# Patient Record
Sex: Female | Born: 1975
Health system: Southern US, Community
[De-identification: ages and names within clinical notes are randomized; demographics above are authoritative.]

## PROBLEM LIST (undated history)

## (undated) DIAGNOSIS — M1990 Unspecified osteoarthritis, unspecified site: Secondary | ICD-10-CM

## (undated) DIAGNOSIS — E739 Lactose intolerance, unspecified: Secondary | ICD-10-CM

## (undated) DIAGNOSIS — I10 Essential (primary) hypertension: Secondary | ICD-10-CM

## (undated) DIAGNOSIS — N809 Endometriosis, unspecified: Secondary | ICD-10-CM

## (undated) DIAGNOSIS — M25561 Pain in right knee: Secondary | ICD-10-CM

## (undated) DIAGNOSIS — E119 Type 2 diabetes mellitus without complications: Secondary | ICD-10-CM

## (undated) DIAGNOSIS — J309 Allergic rhinitis, unspecified: Secondary | ICD-10-CM

## (undated) DIAGNOSIS — N83209 Unspecified ovarian cyst, unspecified side: Secondary | ICD-10-CM

## (undated) DIAGNOSIS — I639 Cerebral infarction, unspecified: Secondary | ICD-10-CM

## (undated) DIAGNOSIS — G4730 Sleep apnea, unspecified: Secondary | ICD-10-CM

## (undated) DIAGNOSIS — E785 Hyperlipidemia, unspecified: Secondary | ICD-10-CM

## (undated) HISTORY — DX: Essential (primary) hypertension: I10

## (undated) HISTORY — DX: Cerebral infarction, unspecified: I63.9

## (undated) HISTORY — DX: Hyperlipidemia, unspecified: E78.5

## (undated) HISTORY — DX: Morbid (severe) obesity due to excess calories: E66.01

## (undated) HISTORY — DX: Pain in right knee: M25.561

## (undated) HISTORY — DX: Allergic rhinitis, unspecified: J30.9

## (undated) HISTORY — DX: Unspecified ovarian cyst, unspecified side: N83.209

## (undated) HISTORY — DX: Lactose intolerance, unspecified: E73.9

## (undated) HISTORY — DX: Type 2 diabetes mellitus without complications: E11.9

## (undated) HISTORY — DX: Unspecified osteoarthritis, unspecified site: M19.90

## (undated) HISTORY — DX: Endometriosis, unspecified: N80.9

---

## 1999-05-20 ENCOUNTER — Emergency Department (HOSPITAL_COMMUNITY): Admission: EM | Admit: 1999-05-20 | Discharge: 1999-05-21 | Payer: Self-pay | Admitting: Emergency Medicine

## 2001-01-19 ENCOUNTER — Encounter: Payer: Self-pay | Admitting: Emergency Medicine

## 2001-01-19 ENCOUNTER — Emergency Department (HOSPITAL_COMMUNITY): Admission: EM | Admit: 2001-01-19 | Discharge: 2001-01-19 | Payer: Self-pay | Admitting: Emergency Medicine

## 2004-07-17 ENCOUNTER — Emergency Department (HOSPITAL_COMMUNITY): Admission: EM | Admit: 2004-07-17 | Discharge: 2004-07-17 | Payer: Self-pay | Admitting: Emergency Medicine

## 2005-08-12 ENCOUNTER — Emergency Department (HOSPITAL_COMMUNITY): Admission: EM | Admit: 2005-08-12 | Discharge: 2005-08-12 | Payer: Self-pay | Admitting: Emergency Medicine

## 2006-01-26 ENCOUNTER — Encounter: Admission: RE | Admit: 2006-01-26 | Discharge: 2006-01-26 | Payer: Self-pay | Admitting: Orthopedic Surgery

## 2006-06-06 ENCOUNTER — Emergency Department (HOSPITAL_COMMUNITY): Admission: EM | Admit: 2006-06-06 | Discharge: 2006-06-06 | Payer: Self-pay | Admitting: Family Medicine

## 2006-10-06 ENCOUNTER — Emergency Department (HOSPITAL_COMMUNITY): Admission: EM | Admit: 2006-10-06 | Discharge: 2006-10-06 | Payer: Self-pay | Admitting: Family Medicine

## 2006-10-27 ENCOUNTER — Emergency Department (HOSPITAL_COMMUNITY): Admission: EM | Admit: 2006-10-27 | Discharge: 2006-10-27 | Payer: Self-pay | Admitting: Emergency Medicine

## 2007-02-09 ENCOUNTER — Emergency Department (HOSPITAL_COMMUNITY): Admission: EM | Admit: 2007-02-09 | Discharge: 2007-02-09 | Payer: Self-pay | Admitting: Emergency Medicine

## 2007-06-26 ENCOUNTER — Emergency Department (HOSPITAL_COMMUNITY): Admission: EM | Admit: 2007-06-26 | Discharge: 2007-06-26 | Payer: Self-pay | Admitting: Emergency Medicine

## 2008-11-09 ENCOUNTER — Emergency Department (HOSPITAL_COMMUNITY): Admission: EM | Admit: 2008-11-09 | Discharge: 2008-11-10 | Payer: Self-pay | Admitting: Emergency Medicine

## 2009-02-12 ENCOUNTER — Emergency Department (HOSPITAL_COMMUNITY): Admission: EM | Admit: 2009-02-12 | Discharge: 2009-02-12 | Payer: Self-pay | Admitting: Emergency Medicine

## 2009-04-24 ENCOUNTER — Emergency Department (HOSPITAL_COMMUNITY): Admission: EM | Admit: 2009-04-24 | Discharge: 2009-04-24 | Payer: Self-pay | Admitting: Family Medicine

## 2009-08-22 ENCOUNTER — Ambulatory Visit: Payer: Self-pay | Admitting: Internal Medicine

## 2009-08-22 DIAGNOSIS — J45909 Unspecified asthma, uncomplicated: Secondary | ICD-10-CM

## 2009-08-22 DIAGNOSIS — M1990 Unspecified osteoarthritis, unspecified site: Secondary | ICD-10-CM | POA: Insufficient documentation

## 2009-08-22 DIAGNOSIS — M129 Arthropathy, unspecified: Secondary | ICD-10-CM

## 2009-08-22 DIAGNOSIS — J309 Allergic rhinitis, unspecified: Secondary | ICD-10-CM

## 2009-08-22 LAB — CONVERTED CEMR LAB
ALT: 16 units/L (ref 0–35)
AST: 17 units/L (ref 0–37)
Albumin: 3.2 g/dL — ABNORMAL LOW (ref 3.5–5.2)
Alkaline Phosphatase: 64 units/L (ref 39–117)
BUN: 6 mg/dL (ref 6–23)
Basophils Absolute: 0 10*3/uL (ref 0.0–0.1)
Basophils Relative: 0.5 % (ref 0.0–3.0)
Bilirubin Urine: NEGATIVE
Bilirubin, Direct: 0.1 mg/dL (ref 0.0–0.3)
CO2: 29 meq/L (ref 19–32)
Calcium: 8.4 mg/dL (ref 8.4–10.5)
Chloride: 105 meq/L (ref 96–112)
Cholesterol: 164 mg/dL (ref 0–200)
Creatinine, Ser: 0.7 mg/dL (ref 0.4–1.2)
Eosinophils Absolute: 0.2 10*3/uL (ref 0.0–0.7)
Eosinophils Relative: 2.1 % (ref 0.0–5.0)
GFR calc non Af Amer: 122.24 mL/min (ref >60)
Glucose, Bld: 179 mg/dL — ABNORMAL HIGH (ref 70–99)
HCT: 34.4 % — ABNORMAL LOW (ref 36.0–46.0)
HDL: 38.9 mg/dL — ABNORMAL LOW (ref >39.00)
Hemoglobin: 11.1 g/dL — ABNORMAL LOW (ref 12.0–15.0)
Ketones, ur: NEGATIVE mg/dL
LDL Cholesterol: 113 mg/dL — ABNORMAL HIGH (ref 0–99)
Leukocytes, UA: NEGATIVE
Lymphocytes Relative: 19.1 % (ref 12.0–46.0)
Lymphs Abs: 1.7 10*3/uL (ref 0.7–4.0)
MCHC: 32.4 g/dL (ref 30.0–36.0)
MCV: 73.8 fL — ABNORMAL LOW (ref 78.0–100.0)
Monocytes Absolute: 0.5 10*3/uL (ref 0.1–1.0)
Monocytes Relative: 5.8 % (ref 3.0–12.0)
Neutro Abs: 6.3 10*3/uL (ref 1.4–7.7)
Neutrophils Relative %: 72.5 % (ref 43.0–77.0)
Nitrite: NEGATIVE
Platelets: 341 10*3/uL (ref 150.0–400.0)
Potassium: 4.1 meq/L (ref 3.5–5.1)
RBC: 4.66 M/uL (ref 3.87–5.11)
RDW: 16 % — ABNORMAL HIGH (ref 11.5–14.6)
Sodium: 139 meq/L (ref 135–145)
Specific Gravity, Urine: 1.025 (ref 1.000–1.030)
TSH: 1.91 microintl units/mL (ref 0.35–5.50)
Total Bilirubin: 0.3 mg/dL (ref 0.3–1.2)
Total CHOL/HDL Ratio: 4
Total Protein, Urine: NEGATIVE mg/dL
Total Protein: 5.9 g/dL — ABNORMAL LOW (ref 6.0–8.3)
Triglycerides: 61 mg/dL (ref 0.0–149.0)
Urine Glucose: 100 mg/dL
Urobilinogen, UA: 0.2 (ref 0.0–1.0)
VLDL: 12.2 mg/dL (ref 0.0–40.0)
WBC: 8.7 10*3/uL (ref 4.5–10.5)
pH: 6 (ref 5.0–8.0)

## 2009-08-23 DIAGNOSIS — E118 Type 2 diabetes mellitus with unspecified complications: Secondary | ICD-10-CM | POA: Insufficient documentation

## 2009-08-23 DIAGNOSIS — E1165 Type 2 diabetes mellitus with hyperglycemia: Secondary | ICD-10-CM

## 2009-08-23 LAB — CONVERTED CEMR LAB: Hgb A1c MFr Bld: 8.2 % — ABNORMAL HIGH (ref 4.6–6.5)

## 2009-08-25 ENCOUNTER — Telehealth: Payer: Self-pay | Admitting: Internal Medicine

## 2009-10-17 ENCOUNTER — Ambulatory Visit: Payer: Self-pay | Admitting: Internal Medicine

## 2009-10-17 DIAGNOSIS — L02229 Furuncle of trunk, unspecified: Secondary | ICD-10-CM

## 2009-11-15 ENCOUNTER — Encounter: Admission: RE | Admit: 2009-11-15 | Discharge: 2010-01-18 | Payer: Self-pay | Admitting: Internal Medicine

## 2009-11-15 ENCOUNTER — Encounter: Payer: Self-pay | Admitting: Internal Medicine

## 2009-11-22 ENCOUNTER — Ambulatory Visit (HOSPITAL_COMMUNITY): Admission: RE | Admit: 2009-11-22 | Discharge: 2009-11-22 | Payer: Self-pay | Admitting: Internal Medicine

## 2009-11-22 ENCOUNTER — Ambulatory Visit: Payer: Self-pay | Admitting: Internal Medicine

## 2009-11-22 DIAGNOSIS — M25561 Pain in right knee: Secondary | ICD-10-CM

## 2009-11-29 ENCOUNTER — Ambulatory Visit: Payer: Self-pay | Admitting: Internal Medicine

## 2009-11-29 DIAGNOSIS — J45901 Unspecified asthma with (acute) exacerbation: Secondary | ICD-10-CM | POA: Insufficient documentation

## 2010-01-12 ENCOUNTER — Ambulatory Visit: Payer: Self-pay | Admitting: Internal Medicine

## 2010-01-12 ENCOUNTER — Encounter (INDEPENDENT_AMBULATORY_CARE_PROVIDER_SITE_OTHER): Payer: Self-pay | Admitting: *Deleted

## 2010-01-12 DIAGNOSIS — M25539 Pain in unspecified wrist: Secondary | ICD-10-CM | POA: Insufficient documentation

## 2010-02-13 ENCOUNTER — Ambulatory Visit: Payer: Self-pay | Admitting: Internal Medicine

## 2010-02-23 ENCOUNTER — Ambulatory Visit: Payer: Self-pay | Admitting: Internal Medicine

## 2010-02-23 DIAGNOSIS — R05 Cough: Secondary | ICD-10-CM

## 2010-02-23 DIAGNOSIS — R059 Cough, unspecified: Secondary | ICD-10-CM | POA: Insufficient documentation

## 2010-02-28 ENCOUNTER — Encounter: Payer: Self-pay | Admitting: Internal Medicine

## 2010-05-24 ENCOUNTER — Other Ambulatory Visit: Payer: BC Managed Care – PPO

## 2010-05-24 ENCOUNTER — Encounter: Payer: Self-pay | Admitting: Internal Medicine

## 2010-05-24 ENCOUNTER — Ambulatory Visit (INDEPENDENT_AMBULATORY_CARE_PROVIDER_SITE_OTHER): Payer: BC Managed Care – PPO | Admitting: Internal Medicine

## 2010-05-24 ENCOUNTER — Encounter (INDEPENDENT_AMBULATORY_CARE_PROVIDER_SITE_OTHER): Payer: Self-pay | Admitting: *Deleted

## 2010-05-24 ENCOUNTER — Other Ambulatory Visit: Payer: Self-pay | Admitting: Internal Medicine

## 2010-05-24 ENCOUNTER — Ambulatory Visit: Admit: 2010-05-24 | Payer: Self-pay | Admitting: Internal Medicine

## 2010-05-24 DIAGNOSIS — E119 Type 2 diabetes mellitus without complications: Secondary | ICD-10-CM

## 2010-05-24 DIAGNOSIS — L509 Urticaria, unspecified: Secondary | ICD-10-CM | POA: Insufficient documentation

## 2010-05-24 NOTE — Letter (Signed)
Summary: [facility]   Imported By: Sherian Rein 11/17/2009 08:30:07  _____________________________________________________________________  External Attachment:    Type:   Image     Comment:   External Document

## 2010-05-24 NOTE — Assessment & Plan Note (Signed)
Summary: ?boil under [name]/cd   Vital Signs:  Patient profile:   35 year old female Height:      68 inches (172.72 cm) Weight:      350.4 pounds (159.27 kg) O2 Sat:      98 % on Room air Temp:     98.7 degrees F (37.06 degrees C) oral Pulse rate:   63 / minute BP sitting:   162 / 98  (left arm) Cuff size:   large  Vitals Entered By: Orlan Leavens (October 17, 2009 10:32 AM)  O2 Flow:  Room air  Procedure Note Last Tetanus: Historical (04/22/2004)  Incision & Drainage: The patient complains of pain, redness, tenderness, and swelling but denies foreign body, discharge, and fever. Date of onset: 10/13/2009 Indication: infected lesion Consent signed: no  Procedure # 1: I & D with packing    Size (in cm): 1.5 x 2.5    Region: inferior    Location: abdomen-upper-right    Comment: indication for and potenital risks/complications of procedure explained to pt who then agrees to proceed. sterile technique used throughout procedure.    Instrument used: #15 blade w/scissors    Anesthesia: 2.0 ml 1% lidocaine w/epinephrine  Cleaned and prepped with: alcohol and betadine Wound dressing: bandaid Instructions: daily dressing changes  CC: ? Boil under (R) [name] Is Patient Diabetic? Yes Did you bring your meter with you today? No Pain Assessment Patient in pain? no        Primary Care Provider:  Newt Lukes, MD  CC:  ? Boil under (R) [name].  History of Present Illness: c/o boil under right [name] - onset pain 4 days ago- associated with increasing redness and swelling in past 2 days - +hx same - prev drained similar cyst-like area few months ago at [facility] care - not improved with warm compress to this area - no fever - no known MRSA contact no other skin lesions or boils  Current Medications (verified): 1)  Nasonex 50 Mcg/act Susp (Mometasone Furoate) .... 2 Sprays Each Nostril Every Morning (For Allergies) 2)  Proair Hfa 108 (90 Base) Mcg/act Aers (Albuterol Sulfate)  .... 2 Puffs Every 4 Hours As Needed For Shortness of Breath 3)  Allegra 180 Mg Tabs (Fexofenadine Hcl) .[name].. 1 By Mouth Once Daily As Needed For Allergies 4)  Metformin Hcl 500 Mg Xr24h-Tab (Metformin Hcl) .... Take 1 By Mouth Two Times A Day 5)  Onetouch Ultra Test  Strp (Glucose Blood) .... Check Blood Sugar Two Times A Day 6)  Onetouch Delica Lancets  Misc (Lancets) .... Use Two Times A Day  Allergies (verified): No Known Drug Allergies  Past History:  Past Medical History: Allergic rhinitis Asthma  OA, R knee  MD roster: gyn - shannon mayden ([facility]) chiropractor - kathy micheals  Review of Systems       The patient complains of suspicious skin lesions.  The patient denies fever, weight loss, chest pain, and headaches.    Physical Exam  General:  obese, alert, well-developed, well-nourished, and cooperative to examination.    Lungs:  normal respiratory effort, no intercostal retractions or use of accessory muscles; normal breath sounds bilaterally - no crackles and no wheezes.    Heart:  normal rate, regular rhythm, no murmur, and no rub. BLE without edema.  Skin:  boil 2.5x1.5cm to rigth of sternum, beneath right [name] along skin fold - min surrounding erythema   Impression & Recommendations:  Problem # 1:  CARBUNCLE AND FURUNCLE OF  TRUNK (ICD-680.2) s/p I&D today - see procedure note above - treat with septra - given recurrence in past, may need surg eval to remove underlying problem Orders: Prescription Created Electronically [phone]) I&D Abscess, Simple / Single (10060)  Complete Medication List: 1)  Nasonex 50 Mcg/act Susp (Mometasone furoate) .... 2 sprays each nostril every morning (for allergies) 2)  Proair Hfa 108 (90 Base) Mcg/act Aers (Albuterol sulfate) .... 2 puffs every 4 hours as needed for shortness of breath 3)  Allegra 180 Mg Tabs (Fexofenadine hcl) .[name].. 1 by mouth once daily as needed for allergies 4)  Metformin Hcl 500 Mg Xr24h-tab (Metformin hcl)  .... Take 1 by mouth two times a day 5)  Onetouch Ultra Test Strp (Glucose blood) .... Check blood sugar two times a day 6)  Onetouch Delica Lancets Misc (Lancets) .... Use two times a day 7)  Septra Ds 800-160 Mg Tabs (Sulfamethoxazole-trimethoprim) .[name].. 1 by mouth two times a day x 7 days  Patient Instructions: 1)  it was good to see you today. 2)  your boil has been drained and packed today - 3)  antibiotics - Septra two times a day x 7 days - your prescription has been electronically submitted to your pharmacy. Please take as directed. Contact our office if you believe you're having problems with the medication(s).  4)  if fever or increasing redness/pain, or if this recurrs in the future, call us for referral to a general surgeon to remove the cyst 5)  Please schedule a follow-up appointment as needed. Prescriptions: SEPTRA DS 800-160 MG TABS (SULFAMETHOXAZOLE-TRIMETHOPRIM) 1 by mouth two times a day x 7 days  #14 x 0   Entered and Authorized by:   Newt Lukes MD   Signed by:   Newt Lukes MD on 10/17/2009   Method used:   Electronically to        [facility] [phone]* (retail)       [address]       [place], [place]  [address]       Ph: [phone]       Fax: [phone]   RxID:   [id number]

## 2010-05-24 NOTE — Progress Notes (Signed)
Summary: one touch Buyer, retail Note From Pharmacy   Caller: [facility] [address] [place]. [phone]* Summary of Call: Patient has one touch delica lancing device and would like rx for delica lancets.  Initial call taken by: Orlan Leavens,  Aug 25, 2009 9:03 AM    New/Updated Medications: ONETOUCH DELICA LANCETS  MISC (LANCETS) use two times a day Prescriptions: ONETOUCH DELICA LANCETS  MISC (LANCETS) use two times a day  #60 x 5   Entered by:   Orlan Leavens   Authorized by:   Newt Lukes MD   Signed by:   Orlan Leavens on 08/25/2009   Method used:   Electronically to        [facility]. [phone]* (retail)       [address]       [place], [place]  [address]       Ph: [phone]       Fax: [phone]   RxID:   [id number]

## 2010-05-24 NOTE — Assessment & Plan Note (Signed)
Summary: R WRIST PAIN-STROKE BY CAR DOOR--STC   Vital Signs:  Patient profile:   35 year old female Height:      68 inches (172.72 cm) Weight:      351 pounds (159.55 kg) O2 Sat:      99 % on Room air Temp:     100.0 degrees F (37.78 degrees C) oral Pulse rate:   62 / minute BP sitting:   148 / 92  (right arm) Cuff size:   large  Vitals Entered By: Orlan Leavens RMA (January 12, 2010 10:26 AM)  O2 Flow:  Room air CC: (L) wrist pain Is Patient Diabetic? Yes Did you bring your meter with you today? No Pain Assessment Patient in pain? yes     Location: (L) wrist Type: sharp/aching Onset of pain  Pt states she miss and close wrist up in car door   Primary Care Provider:  Newt Lukes, MD  CC:  (L) wrist pain.  History of Present Illness: here for left wrist pain acute onset precipitated by trauma - car door opened against ulnar side - heard "crack" upon impact of door pain located across wrist, severity 10/10 a/w swelling, no bruising pain relieved some with ice, pain worse with any movement, esp flexion, pronation  also reviewed chronic med issues: 1) DM2 - [facility] dx 08/2009 at [facility] - reports compliance with ongoing medical treatment and no changes in medication dose or frequency. denies adverse side effects related to current therapy. has been seen at nutritionist in interval - checks cbgs 2x/day, range 110-120s  2) morbid obesity - unable to exercise (walk or swim) due to pain in knee (see next) - tried dietary changes but "eats extra" to conter taking the DM meds "so i don't get sick" -   3) seasonal allg - reports compliance with ongoing medical treatment and no changes in medication dose or frequency. denies adverse side effects related to current therapy. 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 2 Sprays Each Nostril Every Morning (For Allergies) 2)  Proair Hfa 108 (90 Base) Mcg/act Aers (Albuterol Sulfate) .... 2 Puffs Every 4 Hours As Needed For Shortness of Breath 3)  Allegra 180 Mg Tabs (Fexofenadine Hcl) .[name].. 1 By Mouth Once Daily As Needed For Allergies 4)  Metformin Hcl 500 Mg Xr24h-Tab (Metformin Hcl) .... Take 1 By Mouth Two Times A Day 5)  Onetouch Ultra Test  Strp (Glucose Blood) .... Check Blood Sugar Two Times A Day 6)  Onetouch Delica Lancets  Misc (Lancets) .... Use Two Times A Day 7)  Meloxicam 15 Mg Tabs (Meloxicam) .... Take 1 By Mouth Once Daily  Allergies (verified): No Known Drug Allergies  Past History:  Past Medical History: Allergic rhinitis Asthma  diabetes mellitus, type 2 - dx 08/2009 ?OA, R knee   MD  roster: gyn - Energy manager ([facility] pt) chiropractor - Olegario Messier micheals ortho -draper at [employer]  Review of Systems  The patient denies fever, syncope, and headaches.    Physical Exam  General:  obese, alert, well-developed, well-nourished, and cooperative to examination. pt very uncomfortable;   mom at side Lungs:  mildly increase respiratory effort at rest but no intercostal retractions or use of accessory muscles; diminished breath sounds bilaterally - no crackles but soft end exp wheeze bilaterally.    Heart:  normal rate, regular rhythm, no murmur, and no rub. BLE without edema. Msk:  left wrist: diffuse soift tissue swelling on ulnar side of wrist - painful to palp at both ulnar and  radial styliods - reluctant to flex or extend wrist but ligamntous fx intact; pain with supination  - neurovasc intact   Impression & Recommendations:  Problem # 1:  WRIST PAIN, LEFT (ICD-719.43) acute trauma - r/o fx v soft tissue injury with xray now xray neg for fx - reviewwed films with pt/mom in room tx pain - antiinflam (shot now + meloxicam) - ice and wrist splint to rest out of work note next 72h hydrocodone for severe pain if cont pain 7-10d or if worse, call for recheck xray r/o stress fx Orders: Ketorolac-Toradol 15mg  (H0865) Admin of Therapeutic Inj  intramuscular or subcutaneous (78469) Splints- All Types (A4570) T-Wrist Comp Left Min 3 Views (73110TC)  Complete Medication List: 1)  Nasonex 50 Mcg/act Susp (Mometasone furoate) .... 2 sprays each nostril every morning (for allergies) 2)  Proair Hfa 108 (90 Base) Mcg/act Aers (Albuterol sulfate) .... 2 puffs every 4 hours as needed for shortness of breath 3)  Allegra 180 Mg Tabs (Fexofenadine hcl) .[name].. 1 by mouth once daily as needed for allergies 4)  Metformin Hcl 500 Mg Xr24h-tab (Metformin hcl) .... Take 1 by mouth two times a day 5)  Onetouch Ultra Test Strp (Glucose blood) .... Check blood sugar two times a day 6)  Onetouch Delica Lancets Misc (Lancets) .... Use two times a day 7)  Meloxicam 15 Mg Tabs (Meloxicam) .... Take 1 by mouth once daily 8)  Hydrocodone-acetaminophen 5-500 Mg Tabs (Hydrocodone-acetaminophen) .[name].. 1 by mouth every 6 hours as needed for severe pain  Patient Instructions: 1)  it was good to see you today. 2)  xray does not show any obvious fracture of your wrist 3)  wear wrist splint but move fingers as discussed - keep painful area iced 20-31min every 4-6 hours for next 3 days (no work this afternoon - note provided) - 4)  pain pills - hydrocodone if severe; also meloxicam once daily x 7 days -  5)  if continued or worsening pain in 7-10days, return here for recheck  xray Prescriptions: MELOXICAM 15 MG TABS (MELOXICAM) take 1 by mouth once daily  #30 x 1   Entered and Authorized by:   Newt Lukes MD   Signed by:   Newt Lukes MD on 01/12/2010   Method used:   Print then Give to Patient   RxID:   [id number] HYDROCODONE-ACETAMINOPHEN 5-500 MG TABS (HYDROCODONE-ACETAMINOPHEN) 1 by mouth every 6 hours as needed for severe pain  #20 x 0   Entered and Authorized by:   Newt Lukes MD   Signed by:   Newt Lukes MD on 01/12/2010   Method used:   Print then Give to Patient   RxID:   [id number]    Medication Administration  Injection # 1:  Medication: Ketorolac-Toradol 15mg     Diagnosis: WRIST PAIN, LEFT (ZOX-096.04)    Route: IM    Site: RUOQ gluteus    Exp Date: 05/23/2010    Lot #: [id number]    Mfr: novaplus    Comments: Gave pt total of 60mg     Patient tolerated injection without complications    Given by: Orlan Leavens RMA (January 12, 2010 11:20 AM)  Orders Added: 1)  Ketorolac-Toradol 15mg  [J1885] 2)  Admin of Therapeutic Inj  intramuscular or subcutaneous [96372] 3)  Splints- All Types [A4570] 4)  T-Wrist Comp Left Min 3 Views [73110TC] 5)  Est. Patient Level IV [91478]

## 2010-05-24 NOTE — Assessment & Plan Note (Signed)
Summary: congested / cd   Vital Signs:  Patient profile:   35 year old female Height:      68 inches (172.72 cm) Weight:      351.0 pounds (159.55 kg) O2 Sat:      99 % on Room air Temp:     98.8 degrees F (37.11 degrees C) oral Pulse rate:   65 / minute BP sitting:   150 / 90  (left arm) Cuff size:   large  Vitals Entered By: Orlan Leavens RMA (February 13, 2010 11:02 AM)  O2 Flow:  Room air CC: Congestion & loosing voice Is Patient Diabetic? Yes Did you bring your meter with you today? No Pain Assessment Patient in pain? no        Primary Care Provider:  Newt Lukes, MD  CC:  Congestion & loosing voice.  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 denies adverse side effects related to current therapy. has been seen at nutritionist in [place] - checks cbgs 2x/day, range 110-120s  morbid obesity - unable to exercise (walk or swim) due to pain in knee (see below) - tried dietary changes but "eats extra" to counter taking the DM meds "so i don't get sick" -   seasonal allg - reports compliance with ongoing medical treatment and no changes in medication dose or frequency. denies adverse side effects related  to current therapy. 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 2 Sprays Each Nostril Every Morning (For Allergies) 2)  Proair Hfa 108 (90 Base) Mcg/act Aers (Albuterol Sulfate) .... 2 Puffs Every 4 Hours As Needed For Shortness of Breath 3)  Allegra 180 Mg Tabs (Fexofenadine Hcl) .[name].. 1 By Mouth Once Daily As Needed For Allergies 4)  Metformin Hcl 500 Mg Xr24h-Tab (Metformin Hcl) .... Take 1 By Mouth Two Times A Day 5)  Onetouch Ultra Test  Strp (Glucose Blood) .... Check Blood Sugar Two Times A Day 6)  Onetouch Delica Lancets  Misc (Lancets) .... Use Two Times A Day 7)  Meloxicam 15 Mg Tabs (Meloxicam) .... Take 1 By Mouth Once Daily  Allergies (verified):  No Known Drug Allergies  Past History:  Past Medical History: Allergic rhinitis Asthma  diabetes mellitus, type 2 - dx 08/2009 OA, R knee   MD roster: gyn - shannon mayden ([facility] pt) chiropractor - Olegario Messier micheals ortho -draper at [facility] [place]  Review of Systems  The patient denies weight loss, chest pain, headaches, hemoptysis, and abdominal pain.    Physical Exam  General:  obese, alert, well-developed, well-nourished, and cooperative to examination. Ears:  normal pinnae bilaterally, without erythema, swelling, or tenderness to palpation. TMs clear, without effusion, or cerumen impaction. Hearing grossly normal bilaterally  Mouth:  teeth and gums  in good repair; mucous membranes moist, without lesions or ulcers. oropharynx clear without exudate, mod erythema. no PND Lungs:  mildly increase respiratory effort at rest but no intercostal retractions or use of accessory muscles; diminished breath sounds bilaterally - no crackles but soft end exp wheeze bilaterally.    Heart:  normal rate, regular rhythm, no murmur, and no rub. BLE without edema. Msk:  left wrist: diffuse soft tissue swelling on radial side of wrist - painful to palp at radial > ulnar styliod and surrounding soft tissue distal forearm on radial side -  ligamentous fx intact but pain with supination  and bending toward thumb side - neurovasc intact   Impression & Recommendations:  Problem # 1:  ACUTE BRONCHITIS (ICD-466.0)  Her updated medication list for this problem includes:    Proair Hfa 108 (90 Base) Mcg/act Aers (Albuterol sulfate) .[name]... 2 puffs every 4 hours as needed for shortness of breath    Azithromycin 250 Mg Tabs (Azithromycin) .[name]... 2 tabs by mouth today, then 1 by mouth daily starting tomorrow    Benzonatate 200 Mg Caps (Benzonatate) .[name]... 1 by mouth three times a day as needed for cough symptoms  Take antibiotics and other medications as directed. Encouraged to push clear liquids, get enough rest, and take acetaminophen as needed. To be seen in 5-7 days if no improvement, sooner if worse.  Orders: Prescription Created Electronically [phone])  Problem # 2:  WRIST PAIN, LEFT (ICD-719.43)  Orders: T-Wrist Comp Left Min 3 Views (73110TC)  s/p acute trauma 01/12/10 - no obvious fx on xray at time of initial injury s/p tx pain - antiinflam (meloxicam) - ice and wrist splint to rest  continued wrist pain with use and palpation, esp radial side recheck xray now and consider need for hand eval to tx same  Complete Medication List: 1)  Nasonex 50 Mcg/act Susp (Mometasone furoate) .... 2 sprays each nostril every morning (for allergies) 2)  Proair Hfa 108 (90  Base) Mcg/act Aers (Albuterol sulfate) .... 2 puffs every 4 hours as needed for shortness of breath 3)  Allegra 180 Mg Tabs (Fexofenadine hcl) .[name].. 1 by mouth once daily as needed for allergies 4)  Metformin Hcl 500 Mg Xr24h-tab (Metformin hcl) .... Take 1 by mouth two times a day 5)  Onetouch Ultra Test Strp (Glucose blood) .... Check blood sugar two times a day 6)  Onetouch Delica Lancets Misc (Lancets) .... Use two times a day 7)  Meloxicam 15 Mg Tabs (Meloxicam) .... Take 1 by mouth once daily 8)  Azithromycin 250 Mg Tabs (Azithromycin) .... 2 tabs by mouth today, then 1 by mouth daily starting tomorrow 9)  Benzonatate 200 Mg Caps (Benzonatate) .[name].. 1 by mouth three times a day as needed for cough symptoms  Patient Instructions: 1)  it was good to see you today. 2)  new xray of wrist  ordered today - your results will be posted on the phone tree for review in 48-72 hours from the time of test completion; call [phone] and enter your 9 digit MRN (listed above on this page, just below your name); if any changes need to be made or there are abnormal results, you will be contacted directly.  3)  Zpak and benzonate perles for cough symptoms  - your prescriptions have been electronically submitted to your pharmacy. Please take as directed. Contact our office if you believe you're having problems with the medication(s).  4)  Get plenty of rest, drink lots of clear liquids, and use Tylenol or Ibuprofen for fever and comfort. Return in 7-10 days if you're not better:sooner if you're feeling worse. Prescriptions: BENZONATATE 200 MG CAPS (BENZONATATE) 1 by mouth three times a day as needed for cough symptoms  #30 x 0   Entered and Authorized by:   Newt Lukes MD   Signed by:   Newt Lukes MD on 02/13/2010   Method used:   Electronically to        [facility]. [phone]* (retail)       [address]       [place], [place]  [address]       Ph:  [phone]       Fax: [phone]   RxID:   [id number] AZITHROMYCIN 250 MG TABS (AZITHROMYCIN) 2 tabs by mouth today, then 1 by mouth daily starting tomorrow  #6 x 0   Entered and Authorized by:   Newt Lukes MD   Signed by:   Newt Lukes MD on 02/13/2010   Method used:   Electronically to        [facility]. [phone]* (retail)       [address]       [place], [place]  [address]       Ph: [phone]       Fax: [phone]   RxID:   [id number]    Orders Added: 1)  T-Wrist Comp Left Min 3 Views [73110TC] 2)  Est. Patient Level IV [62376] 3)  Prescription Created Electronically [phone]

## 2010-05-24 NOTE — Letter (Signed)
Summary: Out of Work  [facility]  [address] [place], [place] [address]   Phone: [phone]  Fax: [phone]    January 12, 2010   Employee:  Laresha L Hundal    To Whom It May Concern:   For Medical reasons, please excuse the above named employee from work for the following dates:  Start: 01/12/10    End: 01/14/10, Masy return to work Monday 01/15/10  If you need additional information, please feel free to contact our office.         Sincerely,    Dr. Rene Paci

## 2010-05-24 NOTE — Assessment & Plan Note (Signed)
Summary: 3 MO ROV /NWS  #   Vital Signs:  Patient profile:   35 year old female Height:      68 inches (172.72 cm) Weight:      351.6 pounds (159.82 kg) O2 Sat:      97 % on Room air Temp:     97.8 degrees F (36.56 degrees C) oral Pulse rate:   65 / minute BP sitting:   150 / 92  (left arm) Cuff size:   large  Vitals Entered By: Orlan Leavens RMA (February 23, 2010 10:27 AM)  O2 Flow:  Room air CC: 3 month follow-up Is Patient Diabetic? Yes Did you bring your meter with you today? No Pain Assessment Patient in pain? no      Comments Pt states not taking benzonatate pills been taking Robitussin pills ovc   Primary Care Provider:  Newt Lukes, MD  CC:  3 month follow-up.  History of Present Illness: here for f/u  continued left wrist pain  onset 01/12/2010-precipitated by trauma - car door opened against wrist pain relieved some with ice, pain worse with any movement, esp flexion, pronation xray 01/12/10 negative for fx pain has improved in interval but cont mild swelling over thumb side also continued pain when turning the bus steering wheel (lateral movement) has sched hand ortho eval 02/28/10  DM2 - new dx 08/2009 at [facility] - reports compliance with ongoing medical treatment and no changes in medication dose or frequency. denies adverse side effects related to current therapy. has been seen at nutritionist in interval - checks cbgs 2x/day, range 95-105  morbid obesity - prev unable to exercise (walk or swim) due to pain in knee- but now walking 3-4x/wk x - trying dietary changes-  seasonal allg - reports compliance with ongoing medical treatment and no changes in medication dose or frequency. denies adverse side effects related to current therapy. 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 and s/p PT  Clinical Review Panels:  Lipid Management   Cholesterol:  164 (08/22/2009)   LDL (bad choesterol):  113 (08/22/2009)   HDL (good cholesterol):  38.90 (08/22/2009)  Diabetes Management   HgBA1C:  7.2 @ [facility] (11/15/2009)   Creatinine:  0.7 (08/22/2009)  CBC   WBC:  8.7 (08/22/2009)   RBC:  4.66 (08/22/2009)   Hgb:  11.1 (08/22/2009)   Hct:  34.4 (08/22/2009)   Platelets:  341.0 (08/22/2009)   MCV  73.8 (08/22/2009)   MCHC  32.4 (08/22/2009)   RDW  16.0 (08/22/2009)   PMN:  72.5 (08/22/2009)   Lymphs:  19.1 (08/22/2009)   Monos:  5.8 (08/22/2009)   Eosinophils:  2.1 (08/22/2009)   Basophil:  0.5 (08/22/2009)  Complete Metabolic Panel   Glucose:  179 (08/22/2009)   Sodium:  139 (08/22/2009)   Potassium:  4.1 (08/22/2009)   Chloride:  105 (08/22/2009)   CO2:  29 (08/22/2009)   BUN:  6 (08/22/2009)   Creatinine:  0.7 (08/22/2009)   Albumin:  3.2 (08/22/2009)   Total Protein:  5.9 (08/22/2009)   Calcium:  8.4 (08/22/2009)   Total Bili:  0.3 (08/22/2009)   Alk Phos:  64 (08/22/2009)  SGPT (ALT):  16 (08/22/2009)   SGOT (AST):  17 (08/22/2009)   Current Medications (verified): 1)  Nasonex 50 Mcg/act Susp (Mometasone Furoate) .... 2 Sprays Each Nostril Every Morning (For Allergies) 2)  Proair Hfa 108 (90 Base) Mcg/act Aers (Albuterol Sulfate) .... 2 Puffs Every 4 Hours As Needed For Shortness of Breath 3)  Allegra 180 Mg Tabs (Fexofenadine Hcl) .[name].. 1 By Mouth Once Daily As Needed For Allergies 4)  Metformin Hcl 500 Mg Xr24h-Tab (Metformin Hcl) .... Take 1 By Mouth Two Times A Day 5)  Onetouch Ultra Test  Strp (Glucose Blood) .... Check Blood Sugar Two Times A Day 6)  Onetouch Delica Lancets  Misc (Lancets) .... Use Two Times A Day 7)  Meloxicam 15 Mg Tabs (Meloxicam) .... Take 1 By Mouth Once Daily 8)  Robitussin Coughgels 15 Mg  Caps (Dextromethorphan Hbr) .... Take As Needed  Allergies (verified): No Known Drug Allergies  Past History:  Past Medical History: Allergic rhinitis Asthma  diabetes mellitus, type 2 - dx 08/2009 OA, R knee   MD roster: gyn - shannon mayden ([facility]) chiropractor - Olegario [facility]ier [facility] -[facility]per at [facility] - [employer])  Review of Systems  The patient denies fever, weight gain, chest pain, syncope, and hemoptysis.         cont dry cough (recent bronchitis) esp at night  Physical Exam  General:  obese, alert, well-developed, well-nourished, and cooperative to examination. Lungs:  normal respiratory effort, no intercostal retractions or use of accessory muscles; normal breath sounds bilaterally - no crackles and no wheezes.     Heart:  normal rate, regular rhythm, no murmur, and no rub. BLE without edema. Msk:  left wrist: diffuse soft tissue swelling on radial side of wrist - painful to palp at radial > ulnar styliod and surrounding soft tissue distal forearm on radial side -  ligamentous fx intact but pain with supination  and bending toward thumb side - neurovasc intact   Impression & Recommendations:  Problem # 1:  DIABETES MELLITUS, TYPE II (ICD-250.00)  Her updated medication list for this problem includes:    Metformin Hcl 500 Mg Xr24h-tab (Metformin hcl) .[name]... Take 1 by mouth two times a day  Orders: TLB-A1C / Hgb A1C (Glycohemoglobin) (83036-A1C)  new dx 08/2009- initiated metformin for same -check a1c today cont same and cont to work on weight loss -   Labs Reviewed: Creat: 0.7 (08/22/2009)    Reviewed HgBA1c results: 7.2 @ [facility] (11/15/2009)  8.2 (08/22/2009)  Problem # 2:  WRIST PAIN, LEFT (ICD-719.43)  s/p acute trauma 01/12/10 - no obvious fx on xray at time of initial injury or repeat 02/13/10 s/p tx pain - antiinflam (meloxicam) - ice and wrist splint to rest  continued wrist pain with use and palpation, esp radial  side pending hand eval to tx same 02/28/10  Problem # 3:  MORBID OBESITY (ICD-278.01)  education and discussion re: need for exercise and diet to help with weight loss goals - refer to nutrition to asst with this - pt prev asked not to be "badgered" about her weight but does acknowledge need to attention to same and appreciates help offered.  Ht: 68 (08/22/2009)   Wt: 355.12 (08/22/2009)   BMI: 54.19 (08/22/2009)  Ht: 68 (02/23/2010)   Wt: 351.6 (02/23/2010)   BMI: 54.19 (08/22/2009)  Problem # 4:  COUGH (ICD-786.2) s/p tx bronchitis - now residual cough - no fever, no sputum cont suppressive tx and add  PPI once daily x 14d  Complete Medication List: 1)  Nasonex 50 Mcg/act Susp (Mometasone furoate) .... 2 sprays each nostril every morning (for allergies) 2)  Proair Hfa 108 (90 Base) Mcg/act Aers (Albuterol sulfate) .... 2 puffs every 4 hours as needed for shortness of breath 3)  Allegra 180 Mg Tabs (Fexofenadine hcl) .[name].. 1 by mouth once daily as needed for allergies 4)  Metformin Hcl 500 Mg Xr24h-tab (Metformin hcl) .... Take 1 by mouth two times a day 5)  Onetouch Ultra Test Strp (Glucose blood) .... Check blood sugar two times a day 6)  Onetouch Delica Lancets Misc (Lancets) .... Use two times a day 7)  Meloxicam 15 Mg Tabs (Meloxicam) .... Take 1 by mouth once daily 8)  Robitussin Coughgels 15 Mg Caps (Dextromethorphan hbr) .... Take as needed 9)  Prilosec Otc 20 Mg Tbec (Omeprazole magnesium) .[name].. 1 by mouth once daily x 2 weeks (for cough!)  Patient Instructions: 1)  it was good to see you today. 2)  test(s) ordered today - your results will be posted on the phone tree for review in 48-72 hours from the time of test completion; call [phone] and enter your 9 digit MRN (listed above on this page, just below your name); if any changes need to be made or there are abnormal results, you will be contacted directly. use prilosec otc once daily x 2 weeks to help with cough as  discussed and use tessalon each PM, robitussin each AM 3)  it is important that you continue to work on losing weight as you are doing - monitor your diet and consume fewer calories such as less carbohydrates (sugar), less fat and more protein (50-60g/day). you also need to keep increasing your physical activity level - work up to 30 minutes 4-5 times each week.  4)  Please schedule a follow-up appointment in 3 months for diabetes review, call sooner if problems.    Orders Added: 1)  TLB-A1C / Hgb A1C (Glycohemoglobin) [83036-A1C] 2)  Est. Patient Level IV [14782]

## 2010-05-24 NOTE — Letter (Signed)
Summary: [facility]  [facility]   Imported By: Sherian Rein 03/06/2010 09:16:01  _____________________________________________________________________  External Attachment:    Type:   Image     Comment:   External Document

## 2010-05-24 NOTE — Assessment & Plan Note (Signed)
Summary: ASTHMA/ WANTS A BREATHING TREATMENT/NWS   Vital Signs:  Patient profile:   35 year old female Height:      68 inches (172.72 cm) Weight:      351 pounds (159.55 kg) O2 Sat:      98 % on Room air Temp:     98.2 degrees F (36.78 degrees C) oral Pulse rate:   65 / minute BP sitting:   162 / 100  (left arm) Cuff size:   large  Vitals Entered By: Orlan Leavens RMA (November 29, 2009 1:26 PM)  O2 Flow:  Room air CC: Asthma Is Patient Diabetic? Yes Did you bring your meter with you today? No Pain Assessment Patient in pain? no        Primary Care Provider:  Newt Lukes, MD  CC:  Asthma.  History of Present Illness: here for asthma exac - onset 3 days ago - c/o SOB and DOE - min dry cough, no fever +hx same - taking allergy and rescue inhaler as per routine, but "not helping any"  also reviewed prior OV from last week: 1) DM2 - new dx 08/2009 at [facility] - reports compliance with ongoing medical treatment and no changes in medication dose or frequency. denies adverse side effects related to current therapy. has been seen at nutritionist in interval - checks cbgs 2x/day, range 110-120s  2) morbid obesity - unable to exercise (walk or swim) due to pain in knee (see next) - tried dietary changes but "eats extra" to conter taking the DM meds "so i don't get sick" -   3) seasonal allg - reports compliance with ongoing medical treatment and no changes in medication dose or frequency. denies adverse side effects related to current therapy. 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 2 Sprays Each Nostril Every Morning (For Allergies) 2)  Proair Hfa 108 (90 Base) Mcg/act Aers (Albuterol Sulfate) .... 2 Puffs Every 4 Hours As Needed For Shortness of Breath 3)  Allegra 180 Mg Tabs (Fexofenadine Hcl) .[name].. 1 By Mouth Once Daily As Needed For Allergies 4)  Metformin Hcl 500 Mg Xr24h-Tab (Metformin Hcl) .... Take 1 By Mouth Two Times A Day 5)  Onetouch Ultra Test  Strp (Glucose Blood) .... Check Blood Sugar Two Times A Day 6)  Onetouch Delica Lancets  Misc (Lancets) .... Use Two Times A Day 7)  Meloxicam 15 Mg Tabs (Meloxicam) .... Take 1 By Mouth Once Daily  Allergies (verified): No Known Drug Allergies  Past History:  Past Medical History: Allergic rhinitis Asthma  diabetes mellitus, type 2 - dx 08/2009 ?OA, R knee  MD roster: gyn - shannon mayden ([facility]) chiropractor - [facility] -draper at [facility]  Review of Systems       The patient complains of dyspnea on exertion.  The  patient denies fever, weight loss, chest pain, syncope, peripheral edema, headaches, and hemoptysis.    Physical Exam  General:  obese, alert, well-developed, well-nourished, and cooperative to examination.    Lungs:  mildly increase respiratory effort at rest but no intercostal retractions or use of accessory muscles; diminished breath sounds bilaterally - no crackles but soft end exp wheeze bilaterally.    Heart:  normal rate, regular rhythm, no murmur, and no rub. BLE without edema.   Impression & Recommendations:  Problem # 1:  ASTHMA, WITH ACUTE EXACERBATION (ICD-493.92)  given neb tx and steroid shot now - also emperic zpack for poss infx trigger - cont rescue MDI as needed  O2 ok - to f/u if unimproved -  consider spirometry or PFTs  if not responding to std tx Her updated medication list for this problem includes:    Proair Hfa 108 (90 Base) Mcg/act Aers (Albuterol sulfate) .[name]... 2 puffs every 4 hours as needed for shortness of breath  Pulmonary Functions Reviewed: O2 sat: 98 (11/29/2009)  Orders: Prescription Created Electronically [id number]) Depo- Medrol 80mg  (J1040) Admin of Therapeutic Inj  intramuscular or subcutaneous (56387) Nebulizer Tx (56433)  Complete Medication List: 1)  Nasonex 50 Mcg/act Susp (Mometasone furoate) .... 2 sprays each nostril every morning (for allergies) 2)  Proair Hfa 108 (90 Base) Mcg/act Aers (Albuterol sulfate) .... 2 puffs every 4 hours as needed for shortness of breath 3)  Allegra 180 Mg Tabs (Fexofenadine hcl) .[name].. 1 by mouth once daily as needed for allergies 4)  Metformin Hcl 500 Mg Xr24h-tab (Metformin hcl) .... Take 1 by mouth two times a day 5)  Onetouch Ultra Test Strp (Glucose blood) .... Check blood sugar two times a day 6)  Onetouch Delica Lancets Misc (Lancets) .... Use two times a day 7)  Meloxicam 15 Mg Tabs (Meloxicam) .... Take 1 by mouth once daily 8)  Azithromycin 250 Mg Tabs (Azithromycin) .... 2 tabs by mouth today, then 1 by mouth daily starting tomorrow  Patient Instructions: 1)  it was good to see you today. 2)  nebulizer breathing treatment and steroid shot given in office today to help with your breathing symptoms -  3)  your sugars may run a little high the next 5-7 days because of this shot - call if sugars >200 4)  also antibioitcs - Zpack - your prescription has been electronically submitted to your pharmacy. Please take as directed. Contact our office if you believe you're having problems with the medication(s).  5)  if your symptoms continue to worsen (shortness of breath, pain, fever, etc), or if you are unable take anything by mouth (pills, fluids, etc), you should call here or go to the emergency room for further evaluation and treatment.    Prescriptions: AZITHROMYCIN 250 MG TABS (AZITHROMYCIN) 2 tabs by mouth today, then 1 by mouth daily starting tomorrow  #6 x 0   Entered and Authorized by:   Newt Lukes MD   Signed by:   Newt Lukes MD on 11/29/2009   Method used:   Electronically to        [facility]. [phone]* (retail)       [address]       [place], [place]  [address]       Ph: [phone]       Fax: [phone]   RxID:   [id number]    Medication Administration  Injection # 1:  Medication: Depo- Medrol 80mg     Diagnosis: ASTHMA, WITH ACUTE EXACERBATION (EAV-409.81)    Route: IM    Site: RUOQ gluteus    Exp Date: 07/2012    Lot #: [id number]    Mfr: [employer]    Patient tolerated injection without complications    Given by: Orlan Leavens RMA (November 29, 2009 2:11 PM)  Orders Added: 1)  Est. Patient Level IV [19147] 2)  Prescription Created Electronically [phone] 3)  Depo- Medrol 80mg  [J1040] 4)  Admin of Therapeutic Inj  intramuscular or subcutaneous [96372] 5)  Nebulizer Tx [21308]

## 2010-05-24 NOTE — Assessment & Plan Note (Signed)
Summary: PER LUCY  3 MTH FU   STC   Vital Signs:  Patient profile:   35 year old female Height:      68 inches (172.72 cm) Weight:      351.8 pounds (159.91 kg) O2 Sat:      98 % on Room air Temp:     98.6 degrees F (37.00 degrees C) oral Pulse rate:   67 / minute BP sitting:   138 / 84  (left arm) Cuff size:   large  Vitals Entered By: Orlan Leavens RMA (November 22, 2009 10:20 AM)  O2 Flow:  Room air CC: 3 month follow Is Patient Diabetic? Yes Did you bring your meter with you today? Yes Pain Assessment Patient in pain? no        Primary Care Chloie Loney:  Newt Lukes, MD  CC:  3 month follow.  History of Present Illness: here for f/u -   1) DM2 - new dx 08/2009 at [facility] - reports compliance with ongoing medical treatment and no changes in medication dose or frequency. denies adverse side effects related to current therapy. has been seen at nutritionist in interval - checks cbgs 2x/day, range 110-120s  2) morbid obesity - unable to exercise (walk or swim) due to pain in knee (see next) - tried dietary changes but "eats extra" to conter taking the DM meds "so i don't get sick" -   3) seasonal allg - reports compliance with ongoing medical treatment and no changes in medication dose or frequency. denies adverse side effects related to current therapy. 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)  Clinical Review Panels:  Lipid Management   Cholesterol:  164 (08/22/2009)   LDL (bad choesterol):  113  (08/22/2009)   HDL (good cholesterol):  38.90 (08/22/2009)  CBC   WBC:  8.7 (08/22/2009)   RBC:  4.66 (08/22/2009)   Hgb:  11.1 (08/22/2009)   Hct:  34.4 (08/22/2009)   Platelets:  341.0 (08/22/2009)   MCV  73.8 (08/22/2009)   MCHC  32.4 (08/22/2009)   RDW  16.0 (08/22/2009)   PMN:  72.5 (08/22/2009)   Lymphs:  19.1 (08/22/2009)   Monos:  5.8 (08/22/2009)   Eosinophils:  2.1 (08/22/2009)   Basophil:  0.5 (08/22/2009)  Complete Metabolic Panel   Glucose:  179 (08/22/2009)   Sodium:  139 (08/22/2009)   Potassium:  4.1 (08/22/2009)   Chloride:  105 (08/22/2009)   CO2:  29 (08/22/2009)   BUN:  6 (08/22/2009)   Creatinine:  0.7 (08/22/2009)   Albumin:  3.2 (08/22/2009)   Total Protein:  5.9 (08/22/2009)   Calcium:  8.4 (08/22/2009)   Total Bili:  0.3 (08/22/2009)   Alk Phos:  64 (08/22/2009)   SGPT (ALT):  16 (08/22/2009)   SGOT (AST):  17 (08/22/2009)   Current Medications (verified): 1)  Nasonex 50 Mcg/act Susp (Mometasone Furoate) .... 2 Sprays Each Nostril Every Morning (For Allergies) 2)  Proair Hfa 108 (90 Base)  Mcg/act Aers (Albuterol Sulfate) .... 2 Puffs Every 4 Hours As Needed For Shortness of Breath 3)  Allegra 180 Mg Tabs (Fexofenadine Hcl) .[name].. 1 By Mouth Once Daily As Needed For Allergies 4)  Metformin Hcl 500 Mg Xr24h-Tab (Metformin Hcl) .... Take 1 By Mouth Two Times A Day 5)  Onetouch Ultra Test  Strp (Glucose Blood) .... Check Blood Sugar Two Times A Day 6)  Onetouch Delica Lancets  Misc (Lancets) .... 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  The patient denies fever, weight loss, chest pain, syncope, and muscle weakness.    Physical Exam  General:  obese, alert, well-developed, well-nourished, and cooperative to examination.    Lungs:  normal respiratory effort, no intercostal retractions or use of accessory muscles; normal breath sounds bilaterally - no crackles and no wheezes.    Heart:  normal rate, regular rhythm, no murmur, and no rub. BLE without edema. Msk:  mild right knee effusion as compared to left -chronic w/o warmth - no deformity but decreased range of motion, diffuse boggy synovitis. Tender to palpation on joint line, esp laterally. Increased pain with weight bearing. Positive crepitus.    Impression & Recommendations:  Problem # 1:  DIABETES MELLITUS, TYPE II (ICD-250.00)  new dx 08/2009- initiated metformin for same - improvement in a1c as recently reported by nutrition center - cont same and cont to work on weight loss - see next re: evla and tx of knee pain which is limiting current efforts per her report Her updated medication list for this problem includes:    Metformin Hcl 500 Mg Xr24h-tab (Metformin hcl) .[name]... Take 1 by mouth two times a day  Labs Reviewed: Creat: 0.7 (08/22/2009)    Reviewed HgBA1c results: 7.2 @ [facility] (11/15/2009)  8.2 (08/22/2009)  Problem # 2:  KNEE PAIN, RIGHT (ICD-719.46) chronic with acute increase in pain and swelling last 6 weeks - refer to ortho and get xray now given change in symptoms  Orders: T-Knee Comp Right 4 Views [phone]) Orthopedic Surgeon Referral (Ortho Surgeon)  Problem # 3:  MORBID OBESITY (ICD-278.01)  education and discussion re: need for exercise and diet to help with weight loss goals - s/p eval by nutrition to asst with this - pt asks not to be "badgered" about her weight but does acknowledge need to give attention to same and appreciates help offered. Orders: Nutrition Referral  (Nutrition)  Ht: 68 (08/22/2009)   Wt: 355.12 (08/22/2009)   BMI: 54.19 (08/22/2009)  Ht: 68 (11/22/2009)   Wt: 351.8 (11/22/2009)   BMI: 54.19 (08/22/2009)  Complete Medication List: 1)  Nasonex 50 Mcg/act Susp (Mometasone furoate) .... 2 sprays each nostril every morning (for allergies) 2)  Proair Hfa 108 (90 Base) Mcg/act Aers (Albuterol sulfate) .... 2 puffs every 4 hours as needed for shortness of breath 3)  Allegra 180 Mg Tabs (Fexofenadine hcl) .[name].. 1 by mouth once daily as needed for allergies 4)  Metformin Hcl 500 Mg Xr24h-tab (Metformin hcl) .... Take 1 by  mouth two times a day 5)  Onetouch Ultra Test Strp (Glucose blood) .... Check blood sugar two times a day 6)  Onetouch Delica Lancets Misc (Lancets) .... Use two times a day  Patient Instructions: 1)  it was good to see you today. 2)  xray of knee ordered today - your results will be posted on the phone tree for review in 48-72 hours from the time of test completion; call [phone] and enter your 9 digit MRN (listed above on this page, just below your name); if any changes need to be made or there are abnormal results, you will be contacted directly.  3)  also we'll make referral to [facility] orthopedics for evaluation and treatment of knee pain. Our office will contact you regarding this appointment once made.  4)  no medication changes - you are doing well with your diabetes -  5)  Please schedule a follow-up appointment in 3 months to recheck labs for diabetes, call sooner if problems.  Prescriptions: METFORMIN HCL 500 MG XR24H-TAB (METFORMIN HCL) take 1 by mouth two times a day  #60 x 5   Entered and Authorized by:   Newt Lukes MD   Signed by:   Newt Lukes MD on 11/22/2009   Method used:   Electronically to        [facility]. [phone]* (retail)       [address]       [place], [place]  [address]       Ph: [phone]       Fax: [phone]   RxID:    [id number]

## 2010-05-24 NOTE — Assessment & Plan Note (Signed)
Summary: NEW PT/BCBS/PN   Vital Signs:  Patient profile:   35 year old female Height:      68 inches (172.72 cm) Weight:      355.12 pounds (161.42 kg) BMI:     54.19 O2 Sat:      99 % on Room air Temp:     98.7 degrees F (37.06 degrees C) oral Pulse rate:   70 / minute BP sitting:   146 / 82  (left arm) Cuff size:   large  Vitals Entered By: Orlan Leavens (Aug 22, 2009 8:12 AM)  O2 Flow:  Room air CC: New patient Is Patient Diabetic? No Pain Assessment Patient in pain? no        Primary Care Provider:  Newt Lukes, MD  CC:  New patient.  History of Present Illness: new pt to me and our practice, here to est care  patient is here today for annual physical. Patient feels well and has no complaints. fasting for labs - plans to see gyn next week [place]  also would like to discuss obesity possible weight loss - not regularly exercising, no particular diet plans  also needs refill on [facility] for asthma - uses as needed, last use yesterday  seasonal allg - switched from zyrtec to allegra due to sedation issues  R knee OA -   Preventive Screening-Counseling & Management  Alcohol-Tobacco     Alcohol drinks/day: 0     Alcohol Counseling: not indicated; patient does not drink     Smoking Status: never     Tobacco Counseling: not indicated; no tobacco use  Caffeine-Diet-Exercise     Caffeine Counseling: not indicated; caffeine use is not excessive or problematic     Diet Counseling: to improve diet; diet is suboptimal     Nutrition Referrals: yes     Does Patient Exercise: no     Exercise Counseling: to improve exercise regimen     Depression Counseling: not indicated; screening negative for depression  Safety-Violence-Falls     Seat Belt Use: yes     Seat Belt Counseling: not indicated; patient wears seat belts     Helmet Counseling: not indicated; patient wears helmet when riding bicycle/motocycle     Firearm Counseling: not indicated; uses recommended  firearm safety measures     Smoke Detector Counseling: n/a     Violence Counseling: not indicated; no violence risk noted     Fall Risk Counseling: not indicated; no significant falls noted  Clinical Review Panels:  Immunizations   Last Tetanus Booster:  Historical (04/22/2004)   Current Medications (verified): 1)  None  Allergies (verified): No Known Drug Allergies  Past History:  Past Medical History: Allergic rhinitis Asthma OA, R knee  MD rooster: gyn - shannon mayden ([place]) chiropractor - Georjean Mode  Past Surgical History: Denies surgical history  Family History: Family History Hypertension (parent) Stoke (parent) Heart disease (parent) Family History Diabetes 1st degree relative (dad)  Social History: Never Smoked, no alcohol single, 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 I have reviewed all other systems and they were negative.   Physical Exam  General:  obese, alert, well-developed, well-nourished, and cooperative to examination.    Eyes:  vision grossly intact; pupils equal, round and reactive to light.  conjunctiva and lids normal.  Ears:  normal pinnae bilaterally, without erythema, swelling, or tenderness to palpation. TMs clear, without effusion, or cerumen impaction. Hearing grossly normal bilaterally  Mouth:  teeth and gums in good repair; mucous membranes moist, without lesions or ulcers. oropharynx clear without exudate, no erythema.  Neck:  supple, full ROM, no masses, no thyromegaly; no thyroid nodules or tenderness. no JVD or carotid bruits.   Lungs:  normal respiratory effort, no intercostal retractions or use of accessory muscles; normal breath sounds bilaterally - no crackles and no wheezes.    Heart:  normal rate, regular rhythm, no murmur, and no rub. BLE without edema.  Abdomen:  obese, soft, non-tender, normal  bowel sounds, no distention; no masses and no appreciable hepatomegaly or splenomegaly.   Genitalia:  defer to gyn Msk:  mild right knee effusion as compared to left -chronic w/o warmth - no other abnormalities or deformaties Neurologic:  alert & oriented X3 and cranial nerves II-XII symetrically intact.  strength normal in all extremities, sensation intact to light touch, and gait normal. speech fluent without dysarthria or aphasia; follows commands with good comprehension.  Skin:  no rashes, vesicles, ulcers, or erythema. No nodules or irregularity to palpation.  Psych:  Oriented X3, memory intact for recent and remote, normally interactive, good eye contact, not anxious appearing, not depressed appearing, and not agitated.      Impression & Recommendations:  Problem # 1:  PHYSICAL EXAMINATION (ICD-V70.0) Patient has been counseled on age-appropriate routine health concerns for screening and prevention. These are reviewed and up-to-date. Immunizations are up-to-date or declined. Labs ordered and will be reviewed.  Orders: TLB-Lipid Panel (80061-LIPID) TLB-BMP (Basic Metabolic Panel-BMET) (80048-METABOL) TLB-CBC Platelet - w/Differential (85025-CBCD) TLB-Hepatic/Liver Function Pnl (80076-HEPATIC) TLB-TSH (Thyroid Stimulating Hormone) (84443-TSH) TLB-Udip w/ Micro (81001-URINE)  Problem # 2:  MORBID OBESITY (ICD-278.01) education and discussion re: need for exercise and diet to help with weight loss goals - refer to nutrition to asst with this - pt asks not to be "badgered" about her weight but does acknowledge need to attention to same and appreciates help offered. Orders: Nutrition Referral (Nutrition)  Ht: 68 (08/22/2009)   Wt: 355.12 (08/22/2009)   BMI: 54.19 (08/22/2009)  Problem # 3:  ALLERGIC RHINITIS (ICD-477.9)  Her updated medication list for this problem includes:    Nasonex 50 Mcg/act Susp (Mometasone furoate) .[name]... 2 sprays each nostril every morning (for allergies)     Allegra 180 Mg Tabs (Fexofenadine hcl) .[name]... 1 by mouth once daily as needed for allergies  Orders: Prescription Created Electronically [phone])  Discussed use of allergy medications and environmental measures.   Problem # 4:  ARTHRITIS (ICD-716.90) R knee - chronic but stable OA - follows currently with chiropractor for same - advised that if recurrent flares or decline, will consider need for ortho or PT eval to further tx same  Problem # 5:  ASTHMA (ICD-493.90)  Her updated medication list for this problem includes:    Proair Hfa 108 (90 Base) Mcg/act Aers (Albuterol sulfate) .[name]... 2 puffs every 4 hours as needed for shortness of breath  Orders: Prescription Created Electronically [phone])  Pulmonary Functions Reviewed: O2 sat: 99 (08/22/2009)  Complete Medication List: 1)  Nasonex 50 Mcg/act Susp (Mometasone furoate) .... 2 sprays each nostril every morning (for allergies) 2)  Proair Hfa 108 (90 Base) Mcg/act Aers (Albuterol sulfate) .... 2 puffs every 4 hours as needed for shortness of breath 3)  Allegra 180 Mg Tabs (Fexofenadine hcl) .[name].. 1 by mouth once daily as needed  for allergies  Patient Instructions: 1)  it was good to see you today. 2)  test(s) ordered today - your results will be posted on the phone tree for review in 48-72 hours from the time of test completion; call [phone] and enter your 9 digit MRN (listed above on this page, just below your name); if any changes need to be made or there are abnormal results, you will be contacted directly. 3)  it is important that you work on losing weight - monitor your diet and consume fewer calories such as less carbohydrates (sugar) and less fat. you also need to increase your physical activity level - start by walking for 10-20 minutes 3 times per week and work up to 30 minutes 4-5 times each week.   4)  we'll make referral to nutritionist for help with weight loss diet plans. Our office will contact you regarding this  appointment once made.  5)  Nasonex for allerfy symptoms and ProAir (albuterol) inhaler to use as needed - your prescriptions have been electronically submitted to your pharmacy. Please take as directed. Contact our office if you believe you're having problems with the medication(s).  6)  Please schedule a follow-up appointment in 3-6 months to monitor weight, blood pressure and other issues, sooner if problems.  Prescriptions: PROAIR HFA 108 (90 BASE) MCG/ACT AERS (ALBUTEROL SULFATE) 2 puffs every 4 hours as needed for shortness of breath  #1 x 5   Entered and Authorized by:   Newt Lukes MD   Signed by:   Newt Lukes MD on 08/22/2009   Method used:   Electronically to        [facility]. [phone]* (retail)       [address]       [place], [place]  [address]       Ph: [phone]       Fax: [phone]   RxID:   [id number] NASONEX 50 MCG/ACT SUSP (MOMETASONE FUROATE) 2 sprays each nostril every morning (for allergies)  #1 x 3   Entered and Authorized by:   Newt Lukes MD   Signed by:   Newt Lukes MD on 08/22/2009   Method used:   Electronically to        [facility]. [phone]* (retail)       [address]       [place], [place]  [address]       Ph: [phone]       Fax: [phone]   RxID:   [id number]    Immunization History:  Tetanus/Td Immunization History:    Tetanus/Td:  historical (04/22/2004)

## 2010-05-30 NOTE — Assessment & Plan Note (Signed)
Summary: 3 MTH FU-STC   Vital Signs:  Patient profile:   35 year old female Height:      68 inches (172.72 cm) Weight:      346.0 pounds (157.27 kg) BMI:     52.80 O2 Sat:      99 % on Room air Temp:     98.6 degrees F (37.00 degrees C) oral Pulse rate:   66 / minute BP sitting:   152 / 90  (left arm) Cuff size:   large  Vitals Entered By: Orlan Leavens RMA (May 24, 2010 11:04 AM)  O2 Flow:  Room air CC: 3 month follow-up Is Patient Diabetic? Yes Did you bring your meter with you today? No Pain Assessment Patient in pain? no        Primary Care Provider:  Newt Lukes, MD  CC:  3 month follow-up.  History of Present Illness: here for f/u  left wrist pain  onset 01/12/2010-precipitated by trauma - car door opened against wrist pain worse with any movement, esp flexion, pronation xray 01/12/10 negative for fx pain has improved in interval but cont mild swelling over thumb side continued pain when turning the bus steering wheel (lateral movement) s/p hand ortho eval 02/28/10 - improved with steroid shot and voltaren gel and splint  DM2 - [facility] dx 08/2009 at [facility] - reports compliance with ongoing medical treatment and no changes in medication dose or frequency. denies adverse side effects related to current therapy. has been seen at nutritionist in interval - checks cbgs 2x/day, range 95-105  morbid obesity - unable to exercise (walk or swim) due to pain in R knee- but now walking 3-4x/wk x - trying dietary changes-has been to meeting to consider lap band - needs paperwork done  seasonal allg - reports compliance with ongoing medical treatment and no changes in medication dose or frequency. denies adverse side effects related to current therapy. 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) continues on meloxicam and s/p PT  c/o episode of facial irritation - ?itch and swelling after new makeup 2 weeks ago - stopped new product, symptoms resolve but wants "full allg testing"  Clinical Review Panels:  Lipid Management   Cholesterol:  164 (08/22/2009)   LDL (bad choesterol):  113 (08/22/2009)   HDL (good cholesterol):  38.90 (08/22/2009)  Diabetes Management   HgBA1C:  7.4 (02/23/2010)   Creatinine:  0.7 (08/22/2009)  CBC   WBC:  8.7 (08/22/2009)   RBC:  4.66 (08/22/2009)   Hgb:  11.1 (08/22/2009)   Hct:  34.4 (08/22/2009)   Platelets:  341.0 (08/22/2009)   MCV  73.8 (08/22/2009)   MCHC  32.4 (08/22/2009)   RDW  16.0 (08/22/2009)   PMN:  72.5 (08/22/2009)   Lymphs:  19.1 (08/22/2009)   Monos:  5.8 (08/22/2009)   Eosinophils:  2.1 (08/22/2009)   Basophil:  0.5 (08/22/2009)  Complete Metabolic Panel   Glucose:  179 (08/22/2009)   Sodium:  139 (08/22/2009)   Potassium:  4.1 (08/22/2009)   Chloride:  105 (08/22/2009)   CO2:  29 (08/22/2009)   BUN:  6 (08/22/2009)   Creatinine:  0.7 (08/22/2009)   Albumin:  3.2 (08/22/2009)   Total Protein:  5.9 (08/22/2009)   Calcium:  8.4 (08/22/2009)   Total Bili:  0.3 (08/22/2009)   Alk Phos:  64 (08/22/2009)   SGPT (ALT):  16 (08/22/2009)   SGOT (AST):  17 (08/22/2009)   Current Medications (verified): 1)  Nasonex 50 Mcg/act Susp (Mometasone Furoate) .... 2 Sprays Each Nostril Every Morning (For Allergies) 2)  Proair Hfa 108 (90 Base) Mcg/act Aers (Albuterol Sulfate) .... 2 Puffs Every 4 Hours As Needed For Shortness of Breath 3)  Allegra 180 Mg Tabs (Fexofenadine Hcl) .[name].. 1 By Mouth Once Daily As Needed For Allergies 4)  Metformin Hcl 500 Mg Xr24h-Tab (Metformin Hcl) .... Take 1 By Mouth Two Times A Day 5)  Onetouch Ultra Test  Strp (Glucose Blood) .... Check Blood Sugar Two Times A Day 6)  Onetouch Delica Lancets  Misc (Lancets)  .... Use Two Times A Day 7)  Meloxicam 15 Mg Tabs (Meloxicam) .... Take 1 By Mouth Once Daily 8)  Robitussin Coughgels 15 Mg Caps (Dextromethorphan Hbr) .... 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    Physical Exam  General:  obese, alert, well-developed, well-nourished, and cooperative to examination. Lungs:  normal respiratory effort, no intercostal retractions or use of accessory muscles; normal breath sounds bilaterally - no crackles and no wheezes.     Heart:  normal rate, regular rhythm, no murmur, and no rub. BLE without edema. Skin:  no rashes, vesicles, ulcers, or erythema. No nodules or irregularity to palpation.    Impression & Recommendations:  Problem # 1:  DIABETES MELLITUS, TYPE II (ICD-250.00)  Her updated medication list for this problem includes:    Metformin Hcl 500 Mg Xr24h-tab (Metformin hcl) .[name]... Take 1 by mouth two times a day  Orders: TLB-A1C / Hgb A1C (Glycohemoglobin) (83036-A1C)  new dx 08/2009- initiated metformin for same -check a1c today cont same and cont to work on weight loss -   Labs Reviewed: Creat: 0.7 (08/22/2009)    Reviewed HgBA1c results: 7.4 (02/23/2010)  7.2 @ [facility] (11/15/2009)  Problem # 2:  MORBID OBESITY (ICD-278.01)  education and discussion re: need for exercise and diet to help with weight loss goals - s/p nutrition education to asst with this - pt prev asked not to be "badgered" about her weight but does acknowledge need to attention to same and appreciates help offered. s/p meeting with [facility] re: bariatric surg options - will complete paperwork as  requested  Ht: 68 (08/22/2009)   Wt: 355.12 (08/22/2009)   BMI: 54.19 (08/22/2009)  Ht: 68 (02/23/2010)   Wt: 351.6 (02/23/2010)   BMI: 54.19 (08/22/2009)  Ht: 68 (05/24/2010)   Wt: 346.0 (05/24/2010)   BMI: 52.80 (05/24/2010)  Problem # 3:  URTICARIA (ICD-708.9)  now resolved but ?topical rxn to makeup -  given atopy (with hx asthma and allg rhinitis) refer to all for further tests and tx  Orders: Allergy Referral  (Allergy)  Complete Medication List: 1)  Nasonex 50 Mcg/act Susp (Mometasone furoate) .... 2 sprays each nostril every morning (for allergies) 2)  Proair Hfa 108 (90 Base) Mcg/act Aers (Albuterol sulfate) .... 2 puffs every 4 hours as  needed for shortness of breath 3)  Allegra 180 Mg Tabs (Fexofenadine hcl) .[name].. 1 by mouth once daily as needed for allergies 4)  Metformin Hcl 500 Mg Xr24h-tab (Metformin hcl) .... Take 1 by mouth two times a day 5)  Onetouch Ultra Test Strp (Glucose blood) .... Check blood sugar two times a day 6)  Onetouch Delica Lancets Misc (Lancets) .... Use two times a day 7)  Meloxicam 15 Mg Tabs (Meloxicam) .... Take 1 by mouth once daily 8)  Robitussin Coughgels 15 Mg Caps (Dextromethorphan hbr) .... Take as needed 9)  Voltaren 1 % Gel (Diclofenac sodium) .... Apply to wrist prn  Patient Instructions: 1)  it was good to see you today. 2)  test(s) ordered today - your results will be posted on the phone tree for review in 48-72 hours from the time of test completion; call [phone] and enter your 9 digit MRN (listed above on this page, just below your name); if any changes need to be made or there are abnormal results, you will be contacted directly.  3)  we'll make referral to allergist. Our office will contact you regarding this appointment once made.  4)  will complete the letter for your weight loss program as discussed. 5)  it is important that you continue to work on losing weight as you are doing - monitor your diet and consume fewer  calories such as less carbohydrates (sugar), less fat and more protein (50-60g/day). you also need to keep increasing your physical activity level - work up to 30 minutes 4-5 times each week.  You are doing great! 6)  Please schedule a follow-up appointment in 3 months for diabetes review, call sooner if problems.    Orders Added: 1)  TLB-A1C / Hgb A1C (Glycohemoglobin) [83036-A1C] 2)  Allergy Referral  [Allergy] 3)  Est. Patient Level IV [09811]

## 2010-05-30 NOTE — Letter (Signed)
Summary: Generic Letter  [facility]  [address] [place], [place] [address]   Phone: [phone]  Fax: [phone]    05/24/2010  Dr. Rene Paci [address] [place]. [address]  Re: Debra [name] DOB: 08/03/1975  To Whom It May Concern:  The above named patient has been seen by our office for 1 year.  She suffers from following co- morbidities: Diabetes, Degenerative Joint disease.  Her current weight is 346 lbs, height 68", and BMI 52.8.  The patient has undergone the following weight loss attempt: Weight Watchers, Personal trainer with [employer], and Phentermine.  I feel this patient would benefit from weight loss surgery because she has been unsuccessful losing weight with other diet methods, and her medical conditions will become life threating if she does not get her weight under control.  I appreciate your consideration. Please contact me for further questions.  Sincerely,    Dr. Rene Paci                  Appended Document: Generic Letter Notified pt letter ready for pick-up...05/24/10@1 :51pm/LMB

## 2010-06-06 ENCOUNTER — Other Ambulatory Visit (HOSPITAL_COMMUNITY): Payer: Self-pay | Admitting: Surgery

## 2010-06-11 ENCOUNTER — Ambulatory Visit (HOSPITAL_COMMUNITY)
Admission: RE | Admit: 2010-06-11 | Discharge: 2010-06-11 | Disposition: A | Discharge status: Home / Self Care | Payer: BC Managed Care – PPO | Source: Ambulatory Visit | Attending: Surgery | Admitting: Surgery

## 2010-06-11 DIAGNOSIS — Z01812 Encounter for preprocedural laboratory examination: Secondary | ICD-10-CM | POA: Insufficient documentation

## 2010-06-12 ENCOUNTER — Ambulatory Visit: Payer: BC Managed Care – PPO | Admitting: *Deleted

## 2010-06-19 ENCOUNTER — Ambulatory Visit (HOSPITAL_COMMUNITY)
Admission: RE | Admit: 2010-06-19 | Discharge: 2010-06-19 | Disposition: A | Discharge status: Home / Self Care | Payer: BC Managed Care – PPO | Source: Ambulatory Visit | Attending: Surgery | Admitting: Surgery

## 2010-06-19 DIAGNOSIS — E119 Type 2 diabetes mellitus without complications: Secondary | ICD-10-CM | POA: Insufficient documentation

## 2010-06-19 DIAGNOSIS — Z6841 Body Mass Index (BMI) 40.0 and over, adult: Secondary | ICD-10-CM | POA: Insufficient documentation

## 2010-06-19 DIAGNOSIS — M1710 Unilateral primary osteoarthritis, unspecified knee: Secondary | ICD-10-CM | POA: Insufficient documentation

## 2010-06-19 DIAGNOSIS — K7689 Other specified diseases of liver: Secondary | ICD-10-CM | POA: Insufficient documentation

## 2010-06-21 ENCOUNTER — Encounter: Payer: BC Managed Care – PPO | Attending: Surgery | Admitting: *Deleted

## 2010-06-21 DIAGNOSIS — Z01818 Encounter for other preprocedural examination: Secondary | ICD-10-CM | POA: Insufficient documentation

## 2010-06-21 DIAGNOSIS — Z713 Dietary counseling and surveillance: Secondary | ICD-10-CM | POA: Insufficient documentation

## 2010-06-28 ENCOUNTER — Other Ambulatory Visit: Payer: Self-pay | Admitting: Internal Medicine

## 2010-06-28 ENCOUNTER — Encounter: Payer: Self-pay | Admitting: Internal Medicine

## 2010-06-28 ENCOUNTER — Institutional Professional Consult (permissible substitution) (INDEPENDENT_AMBULATORY_CARE_PROVIDER_SITE_OTHER): Payer: BC Managed Care – PPO | Admitting: Internal Medicine

## 2010-06-28 ENCOUNTER — Encounter (INDEPENDENT_AMBULATORY_CARE_PROVIDER_SITE_OTHER): Payer: Self-pay | Admitting: *Deleted

## 2010-06-28 ENCOUNTER — Other Ambulatory Visit: Payer: BC Managed Care – PPO

## 2010-06-28 DIAGNOSIS — J45909 Unspecified asthma, uncomplicated: Secondary | ICD-10-CM

## 2010-06-28 DIAGNOSIS — J309 Allergic rhinitis, unspecified: Secondary | ICD-10-CM

## 2010-06-28 DIAGNOSIS — Z91018 Allergy to other foods: Secondary | ICD-10-CM

## 2010-06-28 DIAGNOSIS — L509 Urticaria, unspecified: Secondary | ICD-10-CM

## 2010-06-28 LAB — CBC WITH DIFFERENTIAL/PLATELET
Eosinophils Relative: 1.1 % (ref 0.0–5.0)
HCT: 32.2 % — ABNORMAL LOW (ref 36.0–46.0)
Hemoglobin: 10.2 g/dL — ABNORMAL LOW (ref 12.0–15.0)
Lymphs Abs: 1.7 10*3/uL (ref 0.7–4.0)
MCV: 72.3 fl — ABNORMAL LOW (ref 78.0–100.0)
Monocytes Absolute: 0.7 10*3/uL (ref 0.1–1.0)
Monocytes Relative: 6.1 % (ref 3.0–12.0)
Neutro Abs: 9.3 10*3/uL — ABNORMAL HIGH (ref 1.4–7.7)
Platelets: 407 10*3/uL — ABNORMAL HIGH (ref 150.0–400.0)
WBC: 11.9 10*3/uL — ABNORMAL HIGH (ref 4.5–10.5)

## 2010-07-02 LAB — CONVERTED CEMR LAB: IgE (Immunoglobulin E), Serum: 255.8 intl units/mL — ABNORMAL HIGH (ref 0.0–180.0)

## 2010-07-03 NOTE — Assessment & Plan Note (Addendum)
Summary: urticaria/mhh   Vital Signs:  Patient profile:   35 year old female Height:      68 inches Weight:      349.50 pounds BMI:     53.33 O2 Sat:      10 % on Room air Pulse rate:   74 / minute BP sitting:   142 / 86  (left arm) Cuff size:   large  Vitals Entered By: Reynaldo Minium CMA (June 28, 2010 2:36 PM)  O2 Flow:  Room air CC: Allergy Consult-Urticaria-Dr. Felicity Coyer.   Primary Provider/Referring Provider:  Newt Lukes, MD  CC:  Allergy Consult-Urticaria-Dr. Felicity Coyer..  History of Present Illness: June 28, 2010- Allergy Consult-Urticaria-Dr. Felicity Coyer. 35 yoF seeking allergy evaluation.  Seasonal allergic nasal congestion and sneezing as a child. Usually manages now with occasional antihistamine.  Food allergy- Tongue gets sore and gfeels swollen with walnuts, pecans, oranges, lemons and pineapple (ok w/ peanuts and shrimp).  Hives- with some cosmetics and makeup products. Also triggered by nervousness and stress, but not hormones. Avoids scented soaps.  Asthma- currently using daily Proair. No ER. Strong odor and colds.  Pending lap-band surgery.   Preventive Screening-Counseling & Management  Alcohol-Tobacco     Alcohol drinks/day: 0     Alcohol Counseling: not indicated; patient does not drink     Smoking Status: never     Tobacco Counseling: not indicated; no tobacco use  Current Medications (verified): 1)  Nasonex 50 Mcg/act Susp (Mometasone Furoate) .... 2 Sprays Each Nostril Every Morning (For Allergies) 2)  Proair Hfa 108 (90 Base) Mcg/act Aers (Albuterol Sulfate) .... 2 Puffs Every 4 Hours As Needed For Shortness of Breath 3)  Allegra 180 Mg Tabs (Fexofenadine Hcl) .[name].. 1 By Mouth Once Daily As Needed For Allergies 4)  Metformin Hcl 500 Mg Xr24h-Tab (Metformin Hcl) .... Take 1 By Mouth Two Times A Day 5)  Onetouch Ultra Test  Strp (Glucose Blood) .... Check Blood Sugar Two Times A Day 6)  Onetouch Delica Lancets  Misc (Lancets) .... Use Two Times  A Day 7)  Meloxicam 15 Mg Tabs (Meloxicam) .... Take 1 By Mouth Once Daily 8)  Robitussin Coughgels 15 Mg Caps (Dextromethorphan Hbr) .... Take As Needed 9)  Voltaren 1 % Gel (Diclofenac Sodium) .... 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  Risk Factors: Alcohol Use: 0 (06/28/2010) Exercise: no (08/22/2009)  Risk Factors: Smoking Status: never (06/28/2010)  Past Medical History: Allergic rhinitis Asthma  Hives Food allergy- tree nuts, citrus diabetes mellitus, type 2 - dx 08/2009 OA, R knee   MD roster: gyn - shannon mayden (hi pt) chiropractor - Olegario Messier micheals  ortho -[facility] at [facility] - [employer])  Review of Systems      See HPI       The patient complains of shortness of breath with activity, shortness of breath at rest, productive cough, difficulty swallowing, sore throat, headaches, nasal congestion/difficulty breathing through nose, sneezing, itching, ear ache, joint stiffness or pain, and change in color of mucus.  The patient denies non-productive cough, coughing up blood, chest pain, irregular heartbeats, acid heartburn, indigestion, loss of appetite,  weight change, abdominal pain, tooth/dental problems, anxiety, depression, hand/feet swelling, rash, and fever.    Physical Exam  Additional Exam:  General: A/Ox3; pleasant and cooperative, NAD, overweight SKIN: no rash, lesions NODES: no lymphadenopathy HEENT: Berry/AT, EOM- WNL, Conjuctivae- clear, PERRLA, TM-WNL, Nose- turbinate edema, wiping and sniffing, Throat- clear and wnl, Mallampati  II,  tonsils atrophic, missing teeth NECK: Supple w/ fair ROM, JVD- none, normal carotid impulses w/o bruits Thyroid- normal to palpation CHEST: Clear to P&A HEART: RRR, no m/g/r heard ABDOMEN: Soft and nl; nml bowel sounds; no organomegaly or masses noted ZOX:WRUE, nl pulses, no edema  NEURO: Grossly intact to observation      Impression & Recommendations:  Problem # 1:  URTICARIA (ICD-708.9)  Avoids recognized triggers- mainly topical cosmetics.   Problem # 2:  ASTHMA (ICD-493.90)  Daily rescue inhaler use now.She needs this daily so frequently that I will have her try a maintenance LABA/ICS  Orders: Consultation Level IV (45409)  Problem # 3:  ALLERGIC RHINITIS (ICD-477.9)  Allergic rhinoconjuncivitis- seasonal. Will use otc antihistamines for now. Her updated medication list for this problem includes:    Nasonex 50 Mcg/act Susp (Mometasone furoate) .[name]... 2 sprays each nostril every morning (for allergies)    Allegra 180 Mg Tabs (Fexofenadine hcl) .[name]... 1 by mouth once daily as needed for allergies  Problem # 4:  PERSONAL HISTORY OF ALLERGY TO OTHER FOODS (ICD-V15.05)  She avoids those foods she has identified. She gets itching and burned feeling in her mouth, but not tight throat or dyspnea. We can wait on Epipen, but did preliminary discussion of anaphyllaxis.  Other Orders: TLB-CBC Platelet - w/Differential (85025-CBCD) T-Allergy Profile Region II-[place], [place], [place], [place], [place] [phone]) T-Food Allergy Profile Specific IgE ([id number])  Patient Instructions: 1)  Return as able for allergy skin testing. Stop all antihistamines 3 days before skin testing, including cold and allergy meds, otc sleep and cough meds. This includes Allegra. 2)  Sample Dulera 100-5,  3)    2 puffs and rinse mouth twice every day 4)  Avoid those foods and products that bother you. 5)  Use the Nasonex every day now in allergy season 6)     2 puffs each nostril every day at bedtime   Orders Added: 1)   Consultation Level IV [14782] 2)  TLB-CBC Platelet - w/Differential [[id number]] 3)  T-Allergy Profile Region [place], [place], [place], [place], [place] [[id number]] 4)  T-Food Allergy Profile Specific IgE [[id number]]

## 2010-07-08 LAB — CULTURE, ROUTINE-ABSCESS

## 2010-07-22 ENCOUNTER — Other Ambulatory Visit: Payer: Self-pay | Admitting: Internal Medicine

## 2010-07-25 ENCOUNTER — Encounter: Payer: Self-pay | Admitting: Internal Medicine

## 2010-07-25 ENCOUNTER — Ambulatory Visit (INDEPENDENT_AMBULATORY_CARE_PROVIDER_SITE_OTHER): Payer: BC Managed Care – PPO | Admitting: Internal Medicine

## 2010-07-25 DIAGNOSIS — Z91018 Allergy to other foods: Secondary | ICD-10-CM

## 2010-07-25 DIAGNOSIS — J309 Allergic rhinitis, unspecified: Secondary | ICD-10-CM

## 2010-07-25 DIAGNOSIS — L509 Urticaria, unspecified: Secondary | ICD-10-CM

## 2010-07-25 DIAGNOSIS — J45909 Unspecified asthma, uncomplicated: Secondary | ICD-10-CM

## 2010-07-25 NOTE — Patient Instructions (Signed)
Since you usually feel well enough controlled, we will continue with present meds, including the [facility].  For itching eyes, try an otc allergy eyedrop like Visine AC, Naphcon A, or Allway  For occasional head congestion, try adding a separate decongestant like Sudafed from pharmacy counter

## 2010-07-25 NOTE — Progress Notes (Signed)
  Subjective:    Patient ID: Debra [name], female    DOB: May 21, 1975, 35 y.o.   MRN: [id number]  HPI 35 yoF with hx allergic rhinitis, hx urticaria and asthma. Off antihistamines is noticing more shortness of breath, right frontal headache, sneeze, mattering of eyes with cough but not wheeze. Denies having an infection. Eyes and nose bother her more than hives. Allergy profile- 06/28/10-  Elevations for dog and cat IgE, Total IgE 255,8 Food IgE- No specific elevations Skin test- 07/25/10- Pos esp cat, dust, several pollens    Review of Systems Constitutional:   No weight loss, night sweats,  Fevers, chills, fatigue, lassitude. HEENT:   No headaches,  Difficulty swallowing,  Tooth/dental problems,  Sore throat,              CV:  No chest pain,  Orthopnea, PND, swelling in lower extremities, anasarca, dizziness, palpitations  GI  No heartburn, indigestion, abdominal pain, nausea, vomiting, diarrhea, change in bowel habits, loss of appetite  Resp: No shortness of breath with exertion or at rest.  No excess mucus, no productive cough,  No non-productive cough,  No coughing up of blood.  No change in color of mucus.  No wheezing.  No chest wall deformity  Skin: GU: no dysuria, change in color of urine, no urgency or frequency.  No flank pain.  MS:  No joint pain or swelling.  No decreased range of motion.  No back pain.  Psych:  No change in mood or affect. No depression or anxiety.  No memory loss.     Objective:   Physical Exam General- Alert, Oriented, Affect-appropriate, Distress- none acute,   Very obese  Skin- rash-none, lesions- none, excoriation- none  Lymphadenopathy- none  Head- atraumatic  Eyes- Gross vision intact, PERRLA, conjunctivae clear, secretions clear  Ears- Normal- Hearing, canals, Tm- sharp and clear L ,   R ,  Nose- Clear, No- Septal dev, mucus, polyps, erosion, perforation . There is mucus bridging, no polyps Throat- Mallampati II , mucosa clear , drainage-  none, tonsils- atrophic  Neck- flexible , trachea midline, no stridor , thyroid nl, carotid no bruit  Chest - symmetrical excursion , unlabored     Heart/CV- RRR , no murmur , no gallop  , no rub, nl s1 s2                     - JVD- none , edema- none, stasis changes- none, varices- none     Lung- clear to P&A, wheeze- none, cough-slight dry cough with deep breath, dullness-none, rub- none     Chest wall-   Abd- tender-no, distended-no, bowel sounds-present, HSM- no  Br/ Gen/ Rectal- Not done, not indicated  Extrem- cyanosis- none, clubbing, none, atrophy- none, strength- nl  Neuro- grossly intact to observation        Assessment & Plan:

## 2010-07-26 LAB — RAPID STREP SCREEN (MED CTR MEBANE ONLY): Streptococcus, Group A Screen (Direct): NEGATIVE

## 2010-07-31 ENCOUNTER — Encounter: Payer: Self-pay | Admitting: Internal Medicine

## 2010-07-31 NOTE — Assessment & Plan Note (Signed)
Not currently symptomatic

## 2010-07-31 NOTE — Assessment & Plan Note (Signed)
We can't identify an ige mechanism for any food intolerance. Educated on intolerance vs allergy. Suggested Food Diary. Avoid poorly tolerated foods.

## 2010-07-31 NOTE — Assessment & Plan Note (Signed)
Skin tests and in vitro tests suggest likely sensitivity to common inhalants. She was educated on environmental precautions. I recommended sudafed and otc eye drops. Wait for now on allergy vaccine.

## 2010-08-13 ENCOUNTER — Encounter: Payer: Self-pay | Admitting: Internal Medicine

## 2010-08-16 ENCOUNTER — Telehealth: Payer: Self-pay | Admitting: Internal Medicine

## 2010-08-16 DIAGNOSIS — J45909 Unspecified asthma, uncomplicated: Secondary | ICD-10-CM

## 2010-08-16 MED ORDER — MOMETASONE FURO-FORMOTEROL FUM 100-5 MCG/ACT IN AERO: 2.0000 | INHALATION_SPRAY | Freq: Two times a day (BID) | RESPIRATORY_TRACT | Status: DC

## 2010-08-16 NOTE — Telephone Encounter (Signed)
Returning call.

## 2010-08-16 NOTE — Telephone Encounter (Signed)
Pt says she is doing very well on Dulera and per last OV note from 07/25/2010, CDY had instructed pt to continue on this medication. RX sent to pt's pharmacy

## 2010-08-16 NOTE — Telephone Encounter (Signed)
lmomtcb x1 

## 2010-08-21 ENCOUNTER — Ambulatory Visit (INDEPENDENT_AMBULATORY_CARE_PROVIDER_SITE_OTHER): Payer: BC Managed Care – PPO | Admitting: Internal Medicine

## 2010-08-21 ENCOUNTER — Telehealth: Payer: Self-pay | Admitting: Internal Medicine

## 2010-08-21 ENCOUNTER — Encounter: Payer: Self-pay | Admitting: Internal Medicine

## 2010-08-21 ENCOUNTER — Other Ambulatory Visit (INDEPENDENT_AMBULATORY_CARE_PROVIDER_SITE_OTHER): Payer: BC Managed Care – PPO

## 2010-08-21 DIAGNOSIS — E119 Type 2 diabetes mellitus without complications: Secondary | ICD-10-CM

## 2010-08-21 DIAGNOSIS — J45909 Unspecified asthma, uncomplicated: Secondary | ICD-10-CM

## 2010-08-21 NOTE — Telephone Encounter (Signed)
Please call patient - lab results with slight increase a1c but no medication changes recommended due to upcoming lap band 09/2010. Continue metformin as ongoing until then. Thanks.

## 2010-08-21 NOTE — Progress Notes (Signed)
  Subjective:    Patient ID: Debra [name], female    DOB: 02-Dec-1975, 35 y.o.   MRN: [id number]  HPI  here for follow up   DM2 - new dx 08/2009 at [facility] - reports compliance with ongoing medical treatment and no changes in medication dose or frequency. denies adverse side effects related to current therapy. has been seen at nutritionist in interval - checks cbgs 2x/day, range 95-105  morbid obesity - unable to exercise (walk or swim) due to pain in R knee- but now walking 3-4x/wk x - trying dietary changes - lap band scheduled 10/02/10  seasonal allg - reports compliance with ongoing medical treatment and no changes in medication dose or frequency. denies adverse side effects related to current therapy. 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 Diabetes mellitus type II 08/2009 dx  . Morbid obesity   . Knee pain, right   . Asthma   . Allergic rhinitis, cause unspecified     Review of Systems  Respiratory: Negative for cough.   Cardiovascular: Negative for chest pain.  Neurological: Negative for headaches.       Objective:   Physical Exam BP 148/88  Pulse 60  Temp(Src) 98.8 F (37.1 C) (Oral)  Ht 5\' 8"  (1.727 m)  Wt 339 lb 12.8 oz (154.132 kg)  BMI 51.67 kg/m2  SpO2 99% Physical Exam  Constitutional: She is morbidly obese. oriented to person, place, and time. She appears well-developed and well-nourished. No distress.  Neck: Thick, normal range of motion. Neck supple. No JVD present. No thyromegaly present.  Cardiovascular: Normal rate, regular rhythm and normal heart sounds.  No murmur  heard. Pulmonary/Chest: Effort normal and breath sounds normal. No respiratory distress. She has no wheezes. Psychiatric: She has a normal mood and affect. Her behavior is normal. Judgment and thought content normal.   Lab Results  Component Value Date   WBC 11.9* 06/28/2010   HGB 10.2* 06/28/2010   HCT 32.2* 06/28/2010   PLT 407.0* 06/28/2010   CHOL 164 08/22/2009   TRIG 61.0 08/22/2009   HDL 38.90* 08/22/2009   ALT 16 08/22/2009   AST 17 08/22/2009   NA 139 08/22/2009   K 4.1 08/22/2009   CL 105 08/22/2009   CREATININE 0.7 08/22/2009   BUN 6 08/22/2009   CO2 29 08/22/2009   TSH 1.91 08/22/2009   HGBA1C 7.0* 05/24/2010       Wt Readings from Last 3 Encounters:  08/21/10 339 lb 12.8 oz (154.132 kg)  07/25/10 343 lb 12.8 oz (155.947 kg)  06/28/10 349 lb 8 oz (158.532 kg)   Assessment & Plan:  See problem list. Medications and labs reviewed today.

## 2010-08-21 NOTE — Assessment & Plan Note (Signed)
The current medical regimen is effective;  continue present plan and medications. Anticipate less medication needs following lap band 09/2010 -  Lab Results  Component Value Date   HGBA1C 7.0* 05/24/2010

## 2010-08-21 NOTE — Telephone Encounter (Signed)
Called pt no ansew Left msg on personal cell md response...08/21/10@2 :34pm/LMB

## 2010-08-21 NOTE — Assessment & Plan Note (Signed)
Working on weight loss - doing well thus far Lap band planned 10/02/10 - encouragement provided Wt Readings from Last 3 Encounters:  08/21/10 339 lb 12.8 oz (154.132 kg)  07/25/10 343 lb 12.8 oz (155.947 kg)  06/28/10 349 lb 8 oz (158.532 kg)

## 2010-08-21 NOTE — Patient Instructions (Signed)
It was good to see you today. Test(s) ordered today. Your results will be called to you after review (48-72hours after test completion). If any changes need to be made, you will be notified at that time. Good luck with lap band 09/2010 - call if you need anything! Please schedule followup in 3-4 months for recheck, call sooner if problems.

## 2010-08-21 NOTE — Assessment & Plan Note (Signed)
Improved with addition Dulera - The current medical regimen is effective;  continue present plan and medications.

## 2010-08-28 ENCOUNTER — Encounter: Payer: Self-pay | Admitting: Internal Medicine

## 2010-09-04 ENCOUNTER — Other Ambulatory Visit: Payer: Self-pay | Admitting: Internal Medicine

## 2010-09-13 ENCOUNTER — Encounter: Payer: BC Managed Care – PPO | Attending: Surgery | Admitting: *Deleted

## 2010-09-13 ENCOUNTER — Ambulatory Visit: Payer: BC Managed Care – PPO

## 2010-09-13 DIAGNOSIS — Z713 Dietary counseling and surveillance: Secondary | ICD-10-CM | POA: Insufficient documentation

## 2010-09-13 DIAGNOSIS — Z01818 Encounter for other preprocedural examination: Secondary | ICD-10-CM | POA: Insufficient documentation

## 2010-09-27 ENCOUNTER — Other Ambulatory Visit (INDEPENDENT_AMBULATORY_CARE_PROVIDER_SITE_OTHER): Payer: Self-pay | Admitting: Surgery

## 2010-09-27 ENCOUNTER — Encounter (HOSPITAL_COMMUNITY): Payer: BC Managed Care – PPO

## 2010-09-27 ENCOUNTER — Ambulatory Visit (HOSPITAL_COMMUNITY)
Admission: RE | Admit: 2010-09-27 | Discharge: 2010-09-27 | Disposition: A | Discharge status: Home / Self Care | Payer: BC Managed Care – PPO | Source: Ambulatory Visit | Attending: Surgery | Admitting: Surgery

## 2010-09-27 DIAGNOSIS — Z01818 Encounter for other preprocedural examination: Secondary | ICD-10-CM | POA: Insufficient documentation

## 2010-09-27 DIAGNOSIS — Z01811 Encounter for preprocedural respiratory examination: Secondary | ICD-10-CM | POA: Insufficient documentation

## 2010-09-27 DIAGNOSIS — Z01812 Encounter for preprocedural laboratory examination: Secondary | ICD-10-CM | POA: Insufficient documentation

## 2010-09-27 DIAGNOSIS — E119 Type 2 diabetes mellitus without complications: Secondary | ICD-10-CM | POA: Insufficient documentation

## 2010-09-27 LAB — CBC
MCH: 21.1 pg — ABNORMAL LOW (ref 26.0–34.0)
MCHC: 29 g/dL — ABNORMAL LOW (ref 30.0–36.0)
MCV: 72.7 fL — ABNORMAL LOW (ref 78.0–100.0)
Platelets: 391 10*3/uL (ref 150–400)
RDW: 16.2 % — ABNORMAL HIGH (ref 11.5–15.5)
WBC: 10.2 10*3/uL (ref 4.0–10.5)

## 2010-09-27 LAB — DIFFERENTIAL
Basophils Relative: 0 % (ref 0–1)
Eosinophils Relative: 2 % (ref 0–5)
Lymphs Abs: 1.7 10*3/uL (ref 0.7–4.0)
Monocytes Absolute: 0.6 10*3/uL (ref 0.1–1.0)
Neutro Abs: 7.7 10*3/uL (ref 1.7–7.7)

## 2010-09-27 LAB — HCG, SERUM, QUALITATIVE: Preg, Serum: NEGATIVE

## 2010-09-27 LAB — COMPREHENSIVE METABOLIC PANEL
Alkaline Phosphatase: 56 U/L (ref 39–117)
BUN: 12 mg/dL (ref 6–23)
CO2: 27 mEq/L (ref 19–32)
Chloride: 102 mEq/L (ref 96–112)
Creatinine, Ser: 0.69 mg/dL (ref 0.4–1.2)
GFR calc non Af Amer: >60 mL/min (ref >60)
Total Bilirubin: 0.2 mg/dL — ABNORMAL LOW (ref 0.3–1.2)

## 2010-10-02 ENCOUNTER — Ambulatory Visit (HOSPITAL_COMMUNITY): Payer: BC Managed Care – PPO

## 2010-10-02 ENCOUNTER — Ambulatory Visit (HOSPITAL_COMMUNITY)
Admission: RE | Admit: 2010-10-02 | Discharge: 2010-10-02 | Disposition: A | Discharge status: Home / Self Care | Payer: BC Managed Care – PPO | Source: Ambulatory Visit | Attending: Surgery | Admitting: Surgery

## 2010-10-02 DIAGNOSIS — Z6841 Body Mass Index (BMI) 40.0 and over, adult: Secondary | ICD-10-CM | POA: Insufficient documentation

## 2010-10-02 DIAGNOSIS — Z01812 Encounter for preprocedural laboratory examination: Secondary | ICD-10-CM | POA: Insufficient documentation

## 2010-10-02 HISTORY — PX: LAPAROSCOPIC GASTRIC BANDING: SHX1100

## 2010-10-02 LAB — GLUCOSE, CAPILLARY

## 2010-10-02 NOTE — Op Note (Signed)
Debra [name], Debra [name]               ACCOUNT NO.:  [id number]  MEDICAL RECORD NO.:  [id number]  LOCATION:  DAYL                         FACILITY:  [facility]  PHYSICIAN:  Sandria Bales. Ezzard Standing, M.D.  DATE OF BIRTH:  03-Aug-1975  DATE OF PROCEDURE:  10/02/2010                              OPERATIVE REPORT  PREOPERATIVE DIAGNOSIS:  Morbid obesity (weight of 339, body mass index of 51.4.)  POSTOPERATIVE DIAGNOSIS:  Morbid obesity (weight of 339, body mass index of 51.4.)  PROCEDURE:  Placement of laparoscopic adjustable band, APL large band.  SURGEON:  Sandria Bales. Ezzard Standing, M.D.  FIRST ASSISTANT:  Mary Sella. Andrey Campanile, MD  ANESTHESIA:  General endotracheal.  ESTIMATED BLOOD LOSS:  Minimal.  INDICATIONS FOR PROCEDURE:  Ms. Tseng is a 35 year old African American female who is a patient of Dr. Rene Paci.  She has been through our bariatric program and is interested in a Lap-Band.  I discussed with her the indications and potential complications of lap band surgery.  Potential complications include, but are not limited to, bleeding, infection, bowel injury, slippage or erosion of the band, long- term nutritional consequences.  OPERATIVE NOTE:  The patient was placed in a supine position, given a general endotracheal anesthetic.  She was given 2 g of Ancef at the initiation of procedure, had PAS stockings in place.  A time-out was held and surgical checklist run.  The abdomen was prepped with ChloraPrep and sterilely draped.  I accessed the abdominal cavity through the left upper quadrant using an 11 mm Optiview trocar and inserted this to the abdominal cavity.  I inspected right and left lobes of the liver, unremarkable.  The omentum was unremarkable.  The bowel that I could see was unremarkable.  I then turned my attention to place another trocar and placed a 5-mm trocar in the subxiphoid location with the [facility] retractor.  I placed a 15-mm trocar in the right subcostal location for  introduction of the band.  I placed an 11 mm trocars through the right of midline and 12 mm trocar to the left of midline.  I then turned attention to the stomach.  I first dissected along the left of the esophagogastric junction on the left crus and made an opening and used the finger dissector to dissect along the left crus.  I then opened the gastrohepatic ligament, identified the right crus.  Prior to doing further dissection, anesthesia passed the sizing balloon.  This was insufflated with 15 cc of air, then pulled back and this fit snugly in the hiatus.  There was no evidence of a hiatal hernia on the balloon test or by her upper GI preoperative, so I did not do a hiatal hernia repair.  Since she had a BMI higher than 50 though her fat in her upper abdomen was not that bad, I did place an APL band.  I passed the finger behind the stomach just below the esophagogastric junction.  I passed the APL band around before cinching the band and then I reinserted the sizing tube and cinched the band over the sizing tube and there was adequate room.  I then imbricated stomach below the band to above the band and  on the left lateral edge of the band using 0 Ethibond suture with tie knot grommets.   This was done three times.  After the stomach had been imbricated, the band lay in a flat position, I took a photo of this.  I then brought the Silastic tubing out through the right paramedian incision.  I removed the liver retractor under direct visualization and all the other trocars were removed without any bleeding or problems.  I then attached the Silastic tubing to the reservoir and placed this in a subcutaneous pocket lateral to the right lateral incision, we put mesh on the back, this was sewn in place with 0 Novafil sutures.  With the port in the right position laterally, I closed the subcutaneous tissues with 2-0 Vicryl suture and closed the skin at each site with 5-0 Vicryl  suture.  I used about 15 cc of 0.25% Marcaine as a local anesthetic and I painted the wounds with Dermabond.  The patient tolerated the procedure well, was transported to the recovery room in good condition.  Sponge and needle count were correct. The patient tolerated the procedure well.   Sandria Bales. Ezzard Standing, M.D., FACS   DHN/MEDQ  D:  10/02/2010  T:  10/02/2010  Job:  [id number]  cc:   Vikki Ports A. Felicity Coyer, MD [address] [place], [place] [address]  Electronically Signed by Ovidio Kin M.D. on 10/02/2010 01:30:21 PM

## 2010-10-10 ENCOUNTER — Encounter (INDEPENDENT_AMBULATORY_CARE_PROVIDER_SITE_OTHER): Payer: Self-pay | Admitting: Surgery

## 2010-10-16 ENCOUNTER — Encounter: Payer: BC Managed Care – PPO | Attending: Surgery | Admitting: *Deleted

## 2010-10-16 DIAGNOSIS — Z713 Dietary counseling and surveillance: Secondary | ICD-10-CM | POA: Insufficient documentation

## 2010-10-16 DIAGNOSIS — Z01818 Encounter for other preprocedural examination: Secondary | ICD-10-CM | POA: Insufficient documentation

## 2010-10-19 ENCOUNTER — Ambulatory Visit (INDEPENDENT_AMBULATORY_CARE_PROVIDER_SITE_OTHER): Payer: BC Managed Care – PPO | Admitting: Surgery

## 2010-10-19 NOTE — Progress Notes (Signed)
HPI: The patient is a 35 year old African American female who sees Dr. Rene Paci.  I placed a laparoscopic adjustable band, APL large, on 02 October 2010. She had some nausea early with the band but no vomiting. She is doing much better now. On Tuesday, June 26 she saw the dietitian and was advanced to a protein diet. She is having no discomfort.   ROS: She has done well with her blood sugar control since surgery.  PE: Lungs: Are clear to auscultation. Abdomen: Her incisions are well-healed. The port is palpable. She has no hernia or mass.  Data Reviewed: []   Assessement: #1. Morbidly obese. Status post lap band placement. #2. Non-insulin-dependent diabetes mellitus. #3. Arthritis of right knee. #4. Episodic asthma.  Plan: #1. The dietitian will advance her diet. #2. She will slowly increase her exercise. Her goal exercise is walking 3 miles 5 days a week. #3. She will see me back in 6 weeks for possible lap band adjustment.

## 2010-10-19 NOTE — Patient Instructions (Signed)
You are doing well postop.  Follow the dietitian's recommendations for diet.  Gradually increase your exercise level.

## 2010-11-02 ENCOUNTER — Inpatient Hospital Stay (INDEPENDENT_AMBULATORY_CARE_PROVIDER_SITE_OTHER)
Admission: RE | Admit: 2010-11-02 | Discharge: 2010-11-02 | Disposition: A | Discharge status: Home / Self Care | Payer: BC Managed Care – PPO | Source: Ambulatory Visit | Attending: Emergency Medicine | Admitting: Emergency Medicine

## 2010-11-02 DIAGNOSIS — L03317 Cellulitis of buttock: Secondary | ICD-10-CM

## 2010-11-02 DIAGNOSIS — L0231 Cutaneous abscess of buttock: Secondary | ICD-10-CM

## 2010-11-07 ENCOUNTER — Telehealth (INDEPENDENT_AMBULATORY_CARE_PROVIDER_SITE_OTHER): Payer: Self-pay | Admitting: Surgery

## 2010-11-12 NOTE — Telephone Encounter (Signed)
I spoke to pt Friday and she will email me a form from [facility] that the doctor can sign to release her

## 2010-11-27 ENCOUNTER — Encounter: Payer: BC Managed Care – PPO | Attending: Surgery | Admitting: *Deleted

## 2010-11-27 ENCOUNTER — Encounter (INDEPENDENT_AMBULATORY_CARE_PROVIDER_SITE_OTHER): Payer: Self-pay

## 2010-11-27 DIAGNOSIS — Z01818 Encounter for other preprocedural examination: Secondary | ICD-10-CM | POA: Insufficient documentation

## 2010-11-27 DIAGNOSIS — Z9884 Bariatric surgery status: Secondary | ICD-10-CM | POA: Insufficient documentation

## 2010-11-27 DIAGNOSIS — Z713 Dietary counseling and surveillance: Secondary | ICD-10-CM | POA: Insufficient documentation

## 2010-11-27 NOTE — Progress Notes (Signed)
  Follow-up visit: 8 Weeks Post-Operative LAGB Surgery  Medical Nutrition Therapy:  Appt start time: 0915 end time:  0930.  Assessment:  Primary concerns today: post-operative bariatric surgery nutrition management.  Weight today: 317.4 lbs Weight change: 5 lbs Total weight lost: 28.7 lbs total BMI: 48.3 % Weight goal: 180 lbs % Weight goal met: 15%  24-hr recall:  B (10-11 AM): Protein shake OR bacon (2-3), 1 egg Snk ( AM): N/A   L (2 PM): Chicken (3-4 oz) with 3/4 cup pinto beans Snk (PM): N/A  D (6-7 PM): Fish or Shrimp (4 oz) with 3/4 cup black beans Snk (9 PM): yogurt cup  Fluid intake: water, protein shake, crystal light  = 40-50 oz Estimated total protein intake: 75g  Medications: No Metformin, Meloxicam Supplementation: Taking supplements regularly  CBG monitoring: Daily Average CBG per patient: 80-110 Last patient reported A1c: N/A  Lab Results  Component Value Date   HGBA1C 7.3* 08/21/2010   Using straws: No Drinking while eating:No Hair loss: None reported Carbonated beverages: No N/V/D/C: Gas pain, Constipation reported (daily) Last Lap-Band fill: Pt has not had a band fill at this point  Recent physical activity:  3 times/week for 30 minutes. Meeting with BELT program coordinator on Friday.  Progress Towards Goal(s):  In progress.   Nutritional Diagnosis:  [place]-3.3 Overweight/obesity As related to recent LAGB surgery.  As evidenced by pt continuing to follow LAGB dietary guidelines for continued weight loss.    Intervention:   Follow Phase 3B: High Protein + Non-Starchy Vegetables  Eat 3-6 small meals/snacks, every 3-5 hrs  Increase lean protein foods to meet 80g goal  Increase fluid intake to 64oz +  Avoid drinking 15 minutes before, during and 30 minutes after eating  Aim for >30 min of physical activity daily  Monitoring/Evaluation:  Dietary intake, exercise, lap band fills, and body weight. Follow up in 1.5 months for 3 month post-op  visit.

## 2010-11-27 NOTE — Patient Instructions (Signed)
Goals:  Follow Phase 3B: High Protein + Non-Starchy Vegetables  Eat 3-6 small meals/snacks, every 3-5 hrs  Increase lean protein foods to meet 80g goal  Increase fluid intake to 64oz +  Avoid drinking 15 minutes before, during and 30 minutes after eating  Aim for >30 min of physical activity daily 

## 2010-11-28 ENCOUNTER — Ambulatory Visit (INDEPENDENT_AMBULATORY_CARE_PROVIDER_SITE_OTHER): Payer: BC Managed Care – PPO | Admitting: Internal Medicine

## 2010-11-28 ENCOUNTER — Encounter: Payer: Self-pay | Admitting: Internal Medicine

## 2010-11-28 ENCOUNTER — Other Ambulatory Visit (INDEPENDENT_AMBULATORY_CARE_PROVIDER_SITE_OTHER): Payer: BC Managed Care – PPO

## 2010-11-28 DIAGNOSIS — E119 Type 2 diabetes mellitus without complications: Secondary | ICD-10-CM

## 2010-11-28 NOTE — Assessment & Plan Note (Signed)
Working on weight loss - doing well thus far! S/p Lap band 10/02/10 - encouragement provided  Wt Readings from Last 3 Encounters:  11/28/10 323 lb 4 oz (146.625 kg)  11/27/10 317 lb 6.4 oz (143.972 kg)  10/19/10 324 lb (146.965 kg)

## 2010-11-28 NOTE — Patient Instructions (Signed)
It was good to see you today. Test(s) ordered today. Your results will be called to you after review (48-72hours after test completion). If any changes need to be made, you will be notified at that time. Stop the metformin now - but call if fasting AM sugar over 120 Good luck with continued weight loss - call if you need anything! Please schedule followup in 4 months for recheck, call sooner if problems.

## 2010-11-28 NOTE — Assessment & Plan Note (Signed)
The current medical regimen is effective;  continue present plan and medications. Anticipate no medication needs following lap band 09/2010 -  Check a1c now and stop metformin - pt to call if cbg>120 fasting AM Lab Results  Component Value Date   HGBA1C 7.3* 08/21/2010

## 2010-11-28 NOTE — Progress Notes (Signed)
  Subjective:    Patient ID: Debra [name], female    DOB: 05-17-1975, 35 y.o.   MRN: [id number]  HPI   here for follow up   DM2 - new dx 08/2009 at [facility] - no metformin since 09/2010 (preop) - cbgs 80-100 - working with nutritionist - checks cbgs 2x/day  morbid obesity - s/p lab band 10/02/10 - start weight 340# preop - improving ability to exercise (walk or swim) due to improved pain in R knee-  seasonal allg - reports compliance with ongoing medical treatment and no changes in medication dose or frequency. denies adverse side effects related to current therapy. 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 Diabetes mellitus type II 08/2009 dx  . Morbid obesity     s/p lab band 09/2010  . Knee pain, right   . Asthma   . Allergic rhinitis, cause unspecified   . Arthritis     Review of Systems  Respiratory: Negative for cough.   Cardiovascular: Negative for chest pain.  Neurological: Negative for headaches.       Objective:   Physical Exam  BP 138/88  Pulse 60  Temp(Src) 97.9 F (36.6 C) (Oral)  Ht 5\' 8"  (1.727 m)  Wt 323 lb 4 oz (146.625 kg)  BMI 49.15 kg/m2  SpO2 99%  LMP 11/21/2010  Constitutional: She is morbidly obese. oriented to person, place, and time. She appears well-developed and well-nourished. No distress.  Neck: Thick, normal range of motion. Neck supple. No JVD present. No thyromegaly present.  Cardiovascular: Normal rate, regular rhythm and normal heart sounds.  No murmur heard. No BLE edema Pulmonary/Chest: Effort normal and breath sounds normal. No  respiratory distress. She has no wheezes. Psychiatric: She has a normal mood and affect. Her behavior is normal. Judgment and thought content normal.   Lab Results  Component Value Date   WBC 10.2 09/27/2010   HGB 10.2* 09/27/2010   HCT 35.2* 09/27/2010   PLT 391 09/27/2010   CHOL 164 08/22/2009   TRIG 61.0 08/22/2009   HDL 38.90* 08/22/2009   ALT 13 09/27/2010   AST 13 09/27/2010   NA 136 09/27/2010   K 4.0 09/27/2010   CL 102 09/27/2010   CREATININE 0.69 09/27/2010   BUN 12 09/27/2010   CO2 27 09/27/2010   TSH 1.91 08/22/2009   HGBA1C 7.3* 08/21/2010       Wt Readings from Last 3 Encounters:  11/28/10 323 lb 4 oz (146.625 kg)  11/27/10 317 lb 6.4 oz (143.972 kg)  10/19/10 324 lb (146.965 kg)   Assessment & Plan:  See problem list. Medications and labs reviewed today.

## 2010-11-30 ENCOUNTER — Encounter (INDEPENDENT_AMBULATORY_CARE_PROVIDER_SITE_OTHER): Payer: Self-pay | Admitting: Surgery

## 2010-11-30 ENCOUNTER — Ambulatory Visit (INDEPENDENT_AMBULATORY_CARE_PROVIDER_SITE_OTHER): Payer: BC Managed Care – PPO | Admitting: Surgery

## 2010-11-30 DIAGNOSIS — Z9884 Bariatric surgery status: Secondary | ICD-10-CM

## 2010-11-30 NOTE — Progress Notes (Signed)
Lap Band Patient  The patient is a 35 year old African American female who sees Dr. Rene Paci.  Her initial weight was 249, BMI 52.9. I placed a laparoscopic adjustable band, APL large, on 02 October 2010. She had some nausea early with the band but no vomiting. She is doing much better now.  She is off her diabetes meds.  She feels resistance and full with her meals now.  She does not feel that she needs a band fill.   Her diet is non starch vegetables/90 gms protein.  She is walking about 1 mile twice/week.  She is attending the BELT program.  ROS:  She has done well with her blood sugar control since surgery.   PE:  BP 142/98  Pulse 60  Temp(Src) 96.8 F (36 C) (Temporal)  Ht 5\' 8"  (1.727 m)  Wt 323 lb 12.8 oz (146.875 kg)  BMI 49.23 kg/m2  LMP 11/21/2010  Lungs: Are clear to auscultation.  Abdomen: Her incisions are well-healed. The port is palpable. She has no hernia or mass.   I did not do a lap band fill today.   Assessement and Plan: #1. Morbidly obese. Status post lap band placement.   No lap band fill today.  See me back in 8 weeks. #2. Non-insulin-dependent diabetes mellitus.  #3. Arthritis of right knee.  #4. Episodic asthma.   #5. She is increasing  her exercise. Her goal exercise is walking 3 miles 5 days a week. She has started the BELT program.

## 2010-11-30 NOTE — Patient Instructions (Signed)
Keep up the good work

## 2010-12-25 ENCOUNTER — Encounter: Payer: BC Managed Care – PPO | Attending: Surgery | Admitting: *Deleted

## 2010-12-25 DIAGNOSIS — Z713 Dietary counseling and surveillance: Secondary | ICD-10-CM | POA: Insufficient documentation

## 2010-12-25 DIAGNOSIS — Z01818 Encounter for other preprocedural examination: Secondary | ICD-10-CM | POA: Insufficient documentation

## 2010-12-25 DIAGNOSIS — Z9884 Bariatric surgery status: Secondary | ICD-10-CM

## 2010-12-25 NOTE — Progress Notes (Signed)
  Follow-up visit: 12 Weeks Post-Operative LAGB Surgery   Medical Nutrition Therapy: Appt start time: 1010 end time: 1040   Assessment: Primary concerns today: post-operative bariatric surgery nutrition management. Pt reports that eating has not changed much. She feels very hungry in the morning but is not able to eat much. Lunch and dinner have not changed. Pt reports that she is trying to incorporate more exercise as Dr. Ezzard Standing has instructed.  Weight today: 319.2 lbs  Weight change: 1.8 lbs  Total weight lost: 26.9 lbs total  BMI: 48.6 %  Weight goal: 180 lbs  % Weight goal met: 14%   24-hr recall:  B (10-11 AM): Protein shake OR  1 egg  Snk ( AM): yogurt cup L (2 PM): Chicken (5 oz), sometimes with 3/4 cup pinto beans, salad or broccoli  Snk (PM): No snacking (hydrating w/ fluids) D (6-7 PM): Fish, Chicken, or Steak (4 oz) with 3/4 cup beans, salad or broccoli  Snk (9 PM): yogurt cup OR leftovers from dinner (smaller portion)  Fluid intake: water, protein shake, crystal light = 40-50 oz  Estimated total protein intake: 75g   Medications: No Metformin, Meloxicam  Pt reports that "I am told I do not have DM anymore"  Supplementation: Taking supplements regularly   CBG monitoring: Not checking anymore Last patient reported A1c: N/A   Lab Results  Component Value Date   HGBA1C 6.2 11/28/2010   Using straws: No  Drinking while eating: No  Hair loss: None reported  Carbonated beverages: No N/V/D/C: None reported; "food feels stuck" Pt reports that she might not be chewing well. Last Lap-Band fill: Pt has not had a band fill at this point per Dr. Ezzard Standing  Recent physical activity: 3 times/week for 30 minutes. Met with BELT program coordinator and plans on participating in distance program. Walking 1-2 miles, 3 times/week.   Progress Towards Goal(s): In progress.   Nutritional Diagnosis:  [place]-3.3 Overweight/obesity As related to recent LAGB surgery. As evidenced by pt  continuing to follow LAGB dietary guidelines for continued weight loss.  Intervention:  Nutrition education.  Monitoring/Evaluation: Dietary intake, exercise, lap band fills, and body weight. Follow up in 3 months for 6 month post-op visit.

## 2010-12-25 NOTE — Patient Instructions (Signed)
Goals: Follow Phase 3B: High Protein + Non-Starchy Vegetables  Eat 3-6 small meals/snacks, every 3-5 hrs  Increase lean protein foods to meet 80g goal  Increase fluid intake to 64oz +  Avoid concentrated sweets Avoid drinking 15 minutes before, during and 30 minutes after eating  Aim for >30 min of physical activity daily

## 2010-12-27 ENCOUNTER — Ambulatory Visit (INDEPENDENT_AMBULATORY_CARE_PROVIDER_SITE_OTHER): Payer: BC Managed Care – PPO | Admitting: Internal Medicine

## 2010-12-27 ENCOUNTER — Encounter: Payer: Self-pay | Admitting: Internal Medicine

## 2010-12-27 VITALS — BP 148/84 | HR 70 | Temp 98.6°F | Ht 68.0 in

## 2010-12-27 DIAGNOSIS — J45901 Unspecified asthma with (acute) exacerbation: Secondary | ICD-10-CM

## 2010-12-27 DIAGNOSIS — J45909 Unspecified asthma, uncomplicated: Secondary | ICD-10-CM

## 2010-12-27 DIAGNOSIS — E119 Type 2 diabetes mellitus without complications: Secondary | ICD-10-CM

## 2010-12-27 MED ORDER — DOXYCYCLINE HYCLATE 100 MG PO TABS: 100.0000 mg | ORAL_TABLET | Freq: Two times a day (BID) | ORAL | Status: AC

## 2010-12-27 MED ORDER — METHYLPREDNISOLONE ACETATE 80 MG/ML IJ SUSP
120.0000 mg | Freq: Once | INTRAMUSCULAR | Status: AC
  Administered 2010-12-27: 120 mg via INTRAMUSCULAR

## 2010-12-27 NOTE — Patient Instructions (Signed)
It was good to see you today. Steroid shot given today - may cause your sugars to run high for next few days Doxy antibiotics - Your prescription(s) have been submitted to your pharmacy. Please take as directed and contact our office if you believe you are having problem(s) with the medication(s).

## 2010-12-27 NOTE — Assessment & Plan Note (Signed)
The current medical regimen is effective;  continue present plan and medications. no medication needs following lap band 09/2010 - diet control; off metformin since 11/2010 Expect exac with steroids for asthma tx (short term) - reviewed same with pt  Lab Results  Component Value Date   HGBA1C 6.2 11/28/2010

## 2010-12-27 NOTE — Progress Notes (Signed)
  Subjective:    Patient ID: Debra [name], female    DOB: 08/21/1975, 35 y.o.   MRN: [id number]  HPI  complains of asthma flare - Onset 4 days ago, rapidly progressing to worse Using rescue inhaler 3-4x/day associated with cough (yellow sputum) and wheeze No chest pain but dyspnea on exertion due to wheeze and tightness No fever, reports 2-3 flares per year, last 06/2010 (out of town)  Past Medical History  Diagnosis Date  . Diabetes mellitus type II 08/2009 dx  . Morbid obesity     s/p lab band 09/2010  . Knee pain, right   . Asthma   . Allergic rhinitis, cause unspecified   . Arthritis      Review of Systems  Constitutional: Negative for fever and unexpected weight change.  Cardiovascular: Negative for palpitations and leg swelling.  Neurological: Negative for headaches.       Objective:   Physical Exam BP 148/84  Pulse 70  Temp(Src) 98.6 F (37 C) (Oral)  Ht 5\' 8"  (1.727 m)  SpO2 98%  LMP 11/21/2010 Wt Readings from Last 3 Encounters:  12/25/10 319 lb 3.2 oz (144.788 kg)  11/30/10 323 lb 12.8 oz (146.875 kg)  11/28/10 323 lb 4 oz (146.625 kg)   Constitutional: She is overweight; hoarse and tired but no acute distress.  HENT: NCAT - OP with erythema, no exudate Neck: Normal range of motion. Neck supple. No JVD present. No thyromegaly present.  Cardiovascular: Normal rate, regular rhythm and normal heart sounds.  No murmur heard. No BLE edema. Pulmonary/Chest: Effort normal and breath sounds with bibasilar rhinchi. No respiratory distress. She has soft bilateral exp wheezes.  Psychiatric: She has a normal mood and affect. Her behavior is normal. Judgment and thought content normal.   Lab Results  Component Value Date   WBC 10.2 09/27/2010   HGB 10.2* 09/27/2010   HCT 35.2* 09/27/2010   PLT 391 09/27/2010   CHOL 164 08/22/2009   TRIG 61.0 08/22/2009   HDL 38.90* 08/22/2009   ALT 13 09/27/2010   AST 13 09/27/2010   NA 136 09/27/2010   K 4.0 09/27/2010   CL 102 09/27/2010   CREATININE 0.69 09/27/2010   BUN 12 09/27/2010   CO2 27 09/27/2010   TSH 1.91 08/22/2009   HGBA1C 6.2 11/28/2010        Assessment & Plan:  Acute asthma flare with bronchitis - IM medrol 120 today - doxy bid x 7 days - continue MDI prn (<71flares per year so will not add maintence asthma control yet)

## 2011-02-15 ENCOUNTER — Encounter (INDEPENDENT_AMBULATORY_CARE_PROVIDER_SITE_OTHER): Payer: Self-pay | Admitting: Surgery

## 2011-02-15 ENCOUNTER — Ambulatory Visit (INDEPENDENT_AMBULATORY_CARE_PROVIDER_SITE_OTHER): Payer: BC Managed Care – PPO | Admitting: Surgery

## 2011-02-15 VITALS — BP 140/90 | HR 60 | Resp 18 | Ht 68.0 in | Wt 309.2 lb

## 2011-02-15 DIAGNOSIS — Z9884 Bariatric surgery status: Secondary | ICD-10-CM

## 2011-02-15 NOTE — Progress Notes (Signed)
Lap Band Patient  ASSESSMENT AND PLAN: 1. Morbidly obese.   Status post lap band placement. AP large.  10/02/2010.  Initial weight 349, BMI - 52.9.  No lap band fill.  She is doing very well with diet and exercise.  I will see her back in 8 weeks.  2. Non-insulin-dependent diabetes mellitus. Off meds. 3. Arthritis of right knee.  4. Episodic asthma.  5. She is increasing her exercise. Her goal exercise is walking 3 miles 5 days a week. She had to stop the BELT program because of school.  She is walking 2 miles/day or swimming one hour.   HISTORY OF PRESENT ILLNESS: Chief Complaint  Patient presents with  . Lap Band Fill    Debra [name] is a 35 y.o. (DOB: 1975/05/07)  AA female who is a patient of Rene Paci, MD, MD and comes to me today for follow up of lap band.  The patient is in good spirits. She has done well with her diet. I discussed with her the "green zone" of a LAP-BAND.  She was doing the BELT program, but because of school cannot do it now. She is walking 1 mile twice a day or swimming for one hour as exercise.  PHYSICAL EXAM: BP 140/90  Pulse 60  Resp 18  Ht 5\' 8"  (1.727 m)  Wt 309 lb 4 oz (140.275 kg)  BMI 47.02 kg/m2  Lungs: Are clear to auscultation.  Abdomen: Her incisions are well-healed. The port is palpable. She has no hernia or mass.   I do not think she needs a lap band adjustment today.  DATA REVIEWED: None

## 2011-02-15 NOTE — Patient Instructions (Signed)
Keep it up.  You are doing very well with you diet and exercise.

## 2011-02-27 ENCOUNTER — Emergency Department (HOSPITAL_COMMUNITY)
Admission: EM | Admit: 2011-02-27 | Discharge: 2011-02-28 | Disposition: A | Discharge status: Home / Self Care | Payer: BC Managed Care – PPO | Attending: Emergency Medicine | Admitting: Emergency Medicine

## 2011-02-27 ENCOUNTER — Encounter (HOSPITAL_COMMUNITY): Payer: Self-pay | Admitting: *Deleted

## 2011-02-27 DIAGNOSIS — J45909 Unspecified asthma, uncomplicated: Secondary | ICD-10-CM

## 2011-02-27 DIAGNOSIS — E119 Type 2 diabetes mellitus without complications: Secondary | ICD-10-CM | POA: Insufficient documentation

## 2011-02-27 DIAGNOSIS — I10 Essential (primary) hypertension: Secondary | ICD-10-CM | POA: Insufficient documentation

## 2011-02-27 DIAGNOSIS — Z8739 Personal history of other diseases of the musculoskeletal system and connective tissue: Secondary | ICD-10-CM | POA: Insufficient documentation

## 2011-02-27 NOTE — ED Notes (Signed)
Pt in c/o shortness of breath, pt with history of asthma and states this feels the same, no help with home inhalers, pt c/o pain with deep breath or talking

## 2011-02-28 ENCOUNTER — Emergency Department (HOSPITAL_COMMUNITY): Payer: BC Managed Care – PPO

## 2011-02-28 LAB — CBC
Hemoglobin: 10.9 g/dL — ABNORMAL LOW (ref 12.0–15.0)
MCH: 22.9 pg — ABNORMAL LOW (ref 26.0–34.0)
MCHC: 30.4 g/dL (ref 30.0–36.0)
MCV: 75.6 fL — ABNORMAL LOW (ref 78.0–100.0)

## 2011-02-28 LAB — DIFFERENTIAL
Basophils Relative: 0 % (ref 0–1)
Eosinophils Absolute: 0.2 10*3/uL (ref 0.0–0.7)
Eosinophils Relative: 2 % (ref 0–5)
Lymphs Abs: 3 10*3/uL (ref 0.7–4.0)
Monocytes Relative: 5 % (ref 3–12)
Neutrophils Relative %: 66 % (ref 43–77)

## 2011-02-28 LAB — BASIC METABOLIC PANEL
BUN: 13 mg/dL (ref 6–23)
CO2: 25 mEq/L (ref 19–32)
Calcium: 9.2 mg/dL (ref 8.4–10.5)
GFR calc non Af Amer: 87 mL/min — ABNORMAL LOW (ref >90)
Glucose, Bld: 98 mg/dL (ref 70–99)
Potassium: 3.3 mEq/L — ABNORMAL LOW (ref 3.5–5.1)

## 2011-02-28 MED ORDER — ALBUTEROL SULFATE (5 MG/ML) 0.5% IN NEBU
2.5000 mg | INHALATION_SOLUTION | Freq: Once | RESPIRATORY_TRACT | Status: AC
  Administered 2011-02-28: 2.5 mg via RESPIRATORY_TRACT
  Filled 2011-02-28: qty 0.5

## 2011-02-28 NOTE — ED Notes (Signed)
Lab tech attempted to draw blood pt not in room

## 2011-02-28 NOTE — ED Notes (Signed)
Pt states that after the breathing treatment she feels a little better. Pt cont to await further dispo. Will cont to monitor

## 2011-02-28 NOTE — ED Provider Notes (Addendum)
History     CSN: [id number] Arrival date & time: 02/27/2011 11:36 PM   First MD Initiated Contact with Patient 02/28/11 0049      Chief Complaint  Patient presents with  . Shortness of Breath    (Consider location/radiation/quality/duration/timing/severity/associated sxs/prior treatment) HPI 35 year old Debra [name] presents to emergency room with complaint of 2 days of tightness in her chest and shortness of breath. Patient reports symptoms started when she went walking in the cold. Patient reports it hurts her to talk. Patient with history of asthma, has used her albuterol and dulera inhalers without improvement. Patient noted to be significantly hypertensive, denies prior history of same. Denies any chest pain leg swelling. Patient is not on birth control does not smoke  Past Medical History  Diagnosis Date  . Diabetes mellitus type II 08/2009 dx  . Morbid obesity     s/p lab band 09/2010  . Knee pain, right   . Asthma   . Allergic rhinitis, cause unspecified   . Arthritis     Past Surgical History  Procedure Date  . Laparoscopic gastric banding 10/02/2010    Family History  Problem Relation Age of Onset  . Heart disease Mother     Parent not sure which 1  . Hypertension Mother     parent not sure which 1  . Stroke Mother     parent not sure which 1  . Diabetes Father     History  Substance Use Topics  . Smoking status: Never Smoker   . Smokeless tobacco: Not on file  . Alcohol Use: No    OB History    Grav Para Term Preterm Abortions TAB SAB Ect Mult Living                  Review of Systems  Constitutional: Negative.   HENT: Negative.   Eyes: Negative.   Respiratory: Positive for chest tightness and shortness of breath.   Cardiovascular: Negative.   Gastrointestinal: Negative.   Genitourinary: Negative.   Musculoskeletal: Negative.   Skin: Negative.   Neurological: Negative.   Hematological: Negative.   Psychiatric/Behavioral: Negative.   All other  systems reviewed and are negative.    Allergies  Review of patient's allergies indicates no known allergies.  Home Medications   Current Outpatient Rx  Name Route Sig Dispense Refill  . ALBUTEROL SULFATE HFA 108 (90 BASE) MCG/ACT IN AERS Inhalation Inhale 2 puffs into the lungs every 4 (four) hours as needed.      [name] CALCIUM CITRATE 950 MG PO TABS Oral Take 1 tablet by mouth 2 (two) times daily.      [name] FEXOFENADINE HCL 180 MG PO TABS Oral Take 180 mg by mouth daily as needed.      . MOMETASONE FUROATE 50 MCG/ACT NA SUSP Nasal 2 sprays by Nasal route every morning.      . MOMETASONE FURO-FORMOTEROL FUM 100-5 MCG/ACT IN AERO Inhalation Inhale 2 puffs into the lungs 2 (two) times daily. 1 Inhaler 6  . MULTI-VITAMIN/MINERALS PO TABS Oral Take 1 tablet by mouth 2 (two) times daily.       BP 188/82  Pulse 64  Temp(Src) 97.3 F (36.3 C) (Oral)  Resp 20  SpO2 100%  LMP 02/27/2011  Physical Exam  Nursing note and vitals reviewed. Constitutional: She is oriented to person, place, and time. She appears well-developed and well-nourished.       Uncomfortable appearing, whispering  HENT:  Head: Normocephalic and atraumatic.  Right Ear: External  ear normal.  Left Ear: External ear normal.  Nose: Nose normal.  Mouth/Throat: Oropharynx is clear and moist.  Eyes: Conjunctivae and EOM are normal. Pupils are equal, round, and reactive to light.  Neck: Normal range of motion. Neck supple. No JVD present. No tracheal deviation present. No thyromegaly present.  Cardiovascular: Normal rate, regular rhythm, normal heart sounds and intact distal pulses.  Exam reveals no gallop and no friction rub.   No murmur heard. Pulmonary/Chest: Effort normal and breath sounds normal. No stridor. No respiratory distress. She has no wheezes. She has no rales. She exhibits no tenderness.  Abdominal: Soft. Bowel sounds are normal. She exhibits no distension and no mass. There is no tenderness. There is no rebound and  no guarding.  Musculoskeletal: Normal range of motion. She exhibits no edema and no tenderness.  Lymphadenopathy:    She has no cervical adenopathy.  Neurological: She is oriented to person, place, and time. She has normal reflexes. No cranial nerve deficit. She exhibits normal muscle tone. Coordination normal.  Skin: Skin is dry. No rash noted. No erythema. No pallor.  Psychiatric: She has a normal mood and affect. Her behavior is normal. Judgment and thought content normal.    ED Course  Procedures (including critical care time)   Labs Reviewed  BASIC METABOLIC PANEL  CBC  DIFFERENTIAL  POCT B-TYPE NATRIURETIC PEPTIDE(BNP)  D-DIMER, QUANTITATIVE   Dg Chest 2 View  02/28/2011  *RADIOLOGY REPORT*  Clinical Data: Short of breath  CHEST - 2 VIEW  Comparison: 09/27/2010  Findings: Lungs are under aerated and clear.  Normal heart size. Bronchitic changes.  No pneumothorax.  No pleural fluid.  IMPRESSION: Bronchitic changes.  Original Report Authenticated By: Donavan Burnet, M.D.     No diagnosis found.    MDM  26 show Debra [name] with 2 days of shortness of breath, no wheezing noted on exam. Concerned given her complaint of chest tightness and morbid obesity for possible PE no prior history of same no leg swelling, will get d-dimer she is not tachycardic or hypoxic. Chest x-ray clear with somewhat bronchitic changes   4:23 AM Patient with negative d-dimer chest x-ray without pulmonary edema. Patient feels better after one neb treatment. Do not feel patient needs to be placed on steroids given no wheezing on exam. Patient counseled to use albuterol inhaler 2 puffs every 4 hours for the next 72 hours round the clock. Patienthypertensive during her visit here today, to follow up with Dr. Bayard Hugger within a week for recheck     Debra Mackie, MD 02/28/11 1610  Debra Mackie, MD 02/28/11 [phone]

## 2011-02-28 NOTE — ED Notes (Signed)
Pt transported to xray via stretcher with transporter. Pt stable at time of transfer

## 2011-02-28 NOTE — ED Notes (Signed)
Pt to ED with c/o SOB that started approx 2 days ago. Pt states she has been out in the cold and has had some cold symptoms but states yesterday she is having trouble breathing. Pt states hard to take deep breaths and she has pains in her chest when trying to breath deeply. Pt placed in gown. Pt awaits MD eval

## 2011-02-28 NOTE — ED Notes (Signed)
Pt given discharge instructions and verbalizes understanding  

## 2011-03-05 ENCOUNTER — Encounter: Payer: Self-pay | Admitting: *Deleted

## 2011-03-05 ENCOUNTER — Encounter: Payer: Self-pay | Admitting: Endocrinology

## 2011-03-05 ENCOUNTER — Ambulatory Visit (INDEPENDENT_AMBULATORY_CARE_PROVIDER_SITE_OTHER): Payer: BC Managed Care – PPO | Admitting: Endocrinology

## 2011-03-05 DIAGNOSIS — E119 Type 2 diabetes mellitus without complications: Secondary | ICD-10-CM

## 2011-03-05 MED ORDER — AZITHROMYCIN 500 MG PO TABS: 500.0000 mg | ORAL_TABLET | Freq: Every day | ORAL | Status: AC

## 2011-03-05 MED ORDER — METHYLPREDNISOLONE (PAK) 4 MG PO TABS: ORAL_TABLET | ORAL | Status: AC

## 2011-03-05 NOTE — Patient Instructions (Addendum)
i have sent a prescription to your pharmacy, for an antibiotic, and a steroid "pack." You should continue to inhalers. Please call if your blood sugar goes over 200.   I hope you feel better soon.  If you don't feel better by next week, please call your doctor.

## 2011-03-05 NOTE — Progress Notes (Signed)
  Subjective:    Patient ID: Debra [name], female    DOB: 1976/03/18, 35 y.o.   MRN: [id number]  HPI Pt states 1 week of moderate prod-quality cough in the chest, and assoc nasal congestion.  She was seen at [facility] last week.  She is overall slightly better.   Since gastric banding, she no longer needs dm meds.   Past Medical History  Diagnosis Date  . Diabetes mellitus type II 08/2009 dx  . Morbid obesity     s/p lab band 09/2010  . Knee pain, right   . Asthma   . Allergic rhinitis, cause unspecified   . Arthritis     Past Surgical History  Procedure Date  . Laparoscopic gastric banding 10/02/2010    History   Social History  . Marital Status: Single    Spouse Name: N/A    Number of Children: N/A  . Years of Education: N/A   Occupational History  . Not on file.   Social History Main Topics  . Smoking status: Never Smoker   . Smokeless tobacco: Not on file  . Alcohol Use: No  . Drug Use: No  . Sexually Active:    Other Topics Concern  . Not on file   Social History Narrative   Single, lives with mom. Employed as school driver    Current Outpatient Prescriptions on File Prior to Visit  Medication Sig Dispense Refill  . albuterol (PROAIR HFA) 108 (90 BASE) MCG/ACT inhaler Inhale 2 puffs into the lungs every 4 (four) hours as needed.        . calcium citrate (CALCITRATE - DOSED IN MG ELEMENTAL CALCIUM) 950 MG tablet Take 1 tablet by mouth 2 (two) times daily.        . fexofenadine (ALLEGRA) 180 MG tablet Take 180 mg by mouth daily as needed.        . mometasone (NASONEX) 50 MCG/ACT nasal spray 2 sprays by Nasal route every morning.        . Mometasone Furo-Formoterol Fum (DULERA) 100-5 MCG/ACT AERO Inhale 2 puffs into the lungs 2 (two) times daily.  1 Inhaler  6  . Multiple Vitamins-Minerals (MULTIVITAMIN WITH MINERALS) tablet Take 1 tablet by mouth 2 (two) times daily.         No Known Allergies  Family History  Problem Relation Age of Onset  . Heart disease  Mother     Parent not sure which 1  . Hypertension Mother     parent not sure which 1  . Stroke Mother     parent not sure which 1  . Diabetes Father     BP 148/92  Pulse 62  Temp(Src) 98.4 F (36.9 C) (Oral)  Ht 5\' 5"  (1.651 m)  Wt 311 lb (141.069 kg)  BMI 51.75 kg/m2  SpO2 98%  LMP 02/27/2011  Review of Systems denies fever, but she has wheezing.  mdi's help.  She also has sore throat.     Objective:   Physical Exam VITAL SIGNS:  See vs page GENERAL: no distress head: no deformity eyes: no periorbital swelling, no proptosis external nose and ears are normal mouth: no lesion seen Ears: tm's are red.   Lab Results  Component Value Date   HGBA1C 6.2 11/28/2010      Assessment & Plan:  URI, new Dm, well-controlled off meds since gastric banding

## 2011-03-25 ENCOUNTER — Ambulatory Visit (INDEPENDENT_AMBULATORY_CARE_PROVIDER_SITE_OTHER): Payer: BC Managed Care – PPO | Admitting: Internal Medicine

## 2011-03-25 ENCOUNTER — Encounter: Payer: Self-pay | Admitting: *Deleted

## 2011-03-25 ENCOUNTER — Encounter: Payer: Self-pay | Admitting: Internal Medicine

## 2011-03-25 VITALS — BP 132/92 | HR 58 | Temp 98.6°F | Wt 314.4 lb

## 2011-03-25 DIAGNOSIS — J45909 Unspecified asthma, uncomplicated: Secondary | ICD-10-CM

## 2011-03-25 DIAGNOSIS — J309 Allergic rhinitis, unspecified: Secondary | ICD-10-CM

## 2011-03-25 MED ORDER — HYDROCODONE-HOMATROPINE 5-1.5 MG/5ML PO SYRP: 5.0000 mL | ORAL_SOLUTION | Freq: Four times a day (QID) | ORAL | Status: AC | PRN

## 2011-03-25 MED ORDER — BUDESONIDE 180 MCG/ACT IN AEPB: 1.0000 | INHALATION_SPRAY | Freq: Two times a day (BID) | RESPIRATORY_TRACT | Status: DC

## 2011-03-25 MED ORDER — RANITIDINE HCL 150 MG PO TABS: 150.0000 mg | ORAL_TABLET | Freq: Two times a day (BID) | ORAL | Status: DC

## 2011-03-25 MED ORDER — BENZONATATE 100 MG PO CAPS: 100.0000 mg | ORAL_CAPSULE | Freq: Three times a day (TID) | ORAL | Status: DC | PRN

## 2011-03-25 NOTE — Patient Instructions (Signed)
It was good to see you today. Stopped sclerae and use Pulmicort Flexeril or twice a day for your asthma - sample and a prescription of same given today Okay to continue albuterol inhaler as needed for cough or wheeze Add Tessalon at night and during day if needed + Hydromet syrup at night to control cough symptoms Also use Zantac twice a day for the next one week to help control cough symptoms (not for reflux this time) Your prescription(s) have been submitted to your pharmacy. Please take as directed and contact our office if you believe you are having problem(s) with the medication(s). Other Medications reviewed, no changes at this time. You're still doing well with weight loss, keep up the good work and restart slowly at the gym once or breathing has improved Please schedule followup in 3-4 months, call sooner if problems.

## 2011-03-25 NOTE — Assessment & Plan Note (Signed)
Follows with allergy (young) for same - positive allergy testing 07/2010 Continue antihist and nasal steroid as ongoing

## 2011-03-25 NOTE — Assessment & Plan Note (Signed)
Recurrent flares this fall - no evidence of acute bronchospasm or infection today change Dulera to steroid only inhaler due to tremor side effects - continue Alb MDI prn Control cough with tessalon and nocturnal hydromet - also H2B bid x 1 week -

## 2011-03-25 NOTE — Progress Notes (Signed)
  Subjective:    Patient ID: Debra [name], female    DOB: [birth year], 35 y.o.   MRN: [id number]  HPI   Here for follow up -reviewed chronic medical issues  Type 2 diabetes -no medications needed since LAP-BAND June 2012 Right knee osteoarthritis - no recent flares. Obesity. Status post lap band June 2012 with ongoing weight loss Asthma - 2 flares of bronchitis already this fall 2012 - continued dyspnea on exertion and dry cough - using dulera and Alb MDI 4x/day for last 3 weeks - complains of "shakey" hands  Past Medical History  Diagnosis Date  . Diabetes mellitus type II 08/2009 dx  . Morbid obesity     s/p lab band 09/2010  . Knee pain, right   . Asthma   . Allergic rhinitis, cause unspecified   . Arthritis      Review of Systems  Constitutional: Negative for fever and unexpected weight change.  Cardiovascular: Negative for palpitations and leg swelling.  Neurological: Negative for headaches.       Objective:   Physical Exam  BP 132/92  Pulse 58  Temp(Src) 98.6 F (37 C) (Oral)  Wt 314 lb 6.4 oz (142.611 kg)  SpO2 99%  LMP 02/27/2011 Wt Readings from Last 3 Encounters:  03/25/11 314 lb 6.4 oz (142.611 kg)  03/05/11 311 lb (141.069 kg)  02/15/11 309 lb 4 oz (140.275 kg)   Constitutional: She is overweight;  no acute distress.  HENT: NCAT - OP without erythema, no exudate Neck: Normal range of motion. Neck supple. No JVD or LAD present. No thyromegaly present.  Cardiovascular: Normal rate, regular rhythm and normal heart sounds.  No murmur heard. No BLE edema. Pulmonary/Chest: Effort normal and breath sounds normal. No respiratory distress. She has no crackle or wheezes.  Psychiatric: She has a normal mood and affect. Her behavior is normal. Judgment and thought content normal.   Lab Results  Component Value Date   WBC 11.2* 02/28/2011   HGB 10.9* 02/28/2011   HCT 35.9* 02/28/2011   PLT 340 02/28/2011   CHOL 164 08/22/2009   TRIG 61.0 08/22/2009   HDL 38.90*  08/22/2009   ALT 13 09/27/2010   AST 13 09/27/2010   NA 138 02/28/2011   K 3.3* 02/28/2011   CL 104 02/28/2011   CREATININE 0.85 02/28/2011   BUN 13 02/28/2011   CO2 25 02/28/2011   TSH 1.91 08/22/2009   HGBA1C 6.2 11/28/2010        Assessment & Plan:   See problem list. Medications and labs reviewed today.

## 2011-03-25 NOTE — Assessment & Plan Note (Signed)
Working on weight loss - doing well thus far! S/p Lap band 10/02/10 - start weight 349#  encouragement provided  Wt Readings from Last 3 Encounters:  03/25/11 314 lb 6.4 oz (142.611 kg)  03/05/11 311 lb (141.069 kg)  02/15/11 309 lb 4 oz (140.275 kg)

## 2011-03-27 ENCOUNTER — Encounter: Payer: Self-pay | Admitting: *Deleted

## 2011-03-27 ENCOUNTER — Encounter: Payer: BC Managed Care – PPO | Attending: Surgery | Admitting: *Deleted

## 2011-03-27 DIAGNOSIS — Z01818 Encounter for other preprocedural examination: Secondary | ICD-10-CM | POA: Insufficient documentation

## 2011-03-27 DIAGNOSIS — Z713 Dietary counseling and surveillance: Secondary | ICD-10-CM | POA: Insufficient documentation

## 2011-03-27 DIAGNOSIS — Z9884 Bariatric surgery status: Secondary | ICD-10-CM

## 2011-03-27 NOTE — Progress Notes (Signed)
  Follow-up visit: 3 Months Post-Operative LAGB Surgery  Medical Nutrition Therapy:  Appt start time: 1140 end time:  1200.  Assessment:  Primary concerns today: post-operative bariatric surgery nutrition management.  Weight today: 313.0 lbs Weight change: 6.2 lbs Total weight lost: 33.1 lbs BMI: 47.7% Weight goal: 180 lbs [facility] weight at [facility]: 346.1 lbs  24-hr recall:  B (6:30 AM): Protein shake OR 2 oz cheese Snk (9  AM): 2 egg, 2 bacon OR Pc of steak (3 oz)   L (2 PM): Salad w/ protein (chicken), light dressing OR Grilled chicken (6 oz) Snk (3:30 PM): nuts (1/4 cup)  D (5:30-6 PM): Fish (3-6 oz) OR Fish (3 oz), 1/2 cup green bean Snk (8-9 PM): SF Jello OR 1/2 cup leftovers  Fluid intake: water, protein shake = 60 oz Estimated total protein intake: 60-75g (variable)  Medications: No changes Supplementation: Taking supplements regularly  CBG monitoring: Not checking Average CBG per patient: N/A Last patient reported A1c: No recent Lab Results  Component Value Date   HGBA1C 6.2 11/28/2010   Using straws: No Drinking while eating: No Hair loss: No Carbonated beverages: No N/V/D/C: None reported; no regurgitation reported Last Lap-Band fill: No recent fill; to see Dr. Ezzard Standing in 2 weeks  Recent physical activity:  Walking or Swimming (3-5 times/week - 60 minutes) yet not able to do any recent activity for over a month due to recent asthma attack; BELT program was not working for schedule   Progress Towards Goal(s):  In progress.   Nutritional Diagnosis:  [place]-3.3 Overweight/obesity As related to recent LAGB surgery.  As evidenced by pt following LAGB guidelines for continued weight loss.    Intervention:  Nutrition education.  Monitoring/Evaluation:  Dietary intake, exercise, lap band fills, and body weight. Follow up in 3 months for 6 month post-op visit.

## 2011-03-27 NOTE — Patient Instructions (Signed)
Goals:  Follow Phase 3B: High Protein + Non-Starchy Vegetables  Eat 3-6 small meals/snacks, every 3-5 hrs  Increase lean protein foods to meet 75-80g goal  Increase fluid intake to 64oz +  Add 15 grams of carbohydrate (fruit, whole grain, starchy vegetable) with meals  Avoid drinking 15 minutes before, during and 30 minutes after eating  Aim for >30 min of physical activity daily

## 2011-04-02 ENCOUNTER — Ambulatory Visit: Payer: BC Managed Care – PPO | Admitting: Internal Medicine

## 2011-04-10 ENCOUNTER — Encounter (INDEPENDENT_AMBULATORY_CARE_PROVIDER_SITE_OTHER): Payer: BC Managed Care – PPO | Admitting: Surgery

## 2011-04-12 ENCOUNTER — Encounter: Payer: Self-pay | Admitting: *Deleted

## 2011-04-12 ENCOUNTER — Encounter: Payer: Self-pay | Admitting: Internal Medicine

## 2011-04-12 ENCOUNTER — Ambulatory Visit (INDEPENDENT_AMBULATORY_CARE_PROVIDER_SITE_OTHER): Payer: BC Managed Care – PPO | Admitting: Internal Medicine

## 2011-04-12 VITALS — BP 112/88 | HR 70 | Temp 99.0°F

## 2011-04-12 DIAGNOSIS — J45901 Unspecified asthma with (acute) exacerbation: Secondary | ICD-10-CM

## 2011-04-12 DIAGNOSIS — J45909 Unspecified asthma, uncomplicated: Secondary | ICD-10-CM

## 2011-04-12 MED ORDER — METHYLPREDNISOLONE ACETATE 80 MG/ML IJ SUSP
120.0000 mg | Freq: Once | INTRAMUSCULAR | Status: AC
  Administered 2011-04-12: 120 mg via INTRAMUSCULAR

## 2011-04-12 MED ORDER — AMOXICILLIN-POT CLAVULANATE 875-125 MG PO TABS: 1.0000 | ORAL_TABLET | Freq: Two times a day (BID) | ORAL | Status: AC

## 2011-04-12 MED ORDER — HYDROCOD POLST-CHLORPHEN POLST 10-8 MG/5ML PO LQCR: 5.0000 mL | Freq: Two times a day (BID) | ORAL | Status: DC | PRN

## 2011-04-12 NOTE — Patient Instructions (Signed)
It was good to see you today. Medrol shot given today Tussionex cough syrup for cough symptoms and Augmentin twice a day for 10 days to treat bronchitis symptoms Continue as the medication and other treatment as reviewed, no changes Workup provided today as requested Alternate between ibuprofen and tylenol for aches, pain and fever symptoms as discussed Rest, hydrate and use warm saltwater gargle as needed

## 2011-04-12 NOTE — Progress Notes (Signed)
  Subjective:    Patient ID: Debra [name], female    DOB: [birth year], 35 y.o.   MRN: [id number]  Cough This is a recurrent problem. The current episode started in the past 7 days. The problem has been waxing and waning. The problem occurs every few minutes. The cough is productive of sputum. Associated symptoms include chills, a fever, headaches, myalgias, a sore throat, shortness of breath and wheezing. Pertinent negatives include no chest pain, heartburn, hemoptysis, nasal congestion or postnasal drip. The symptoms are aggravated by lying down. She has tried a beta-agonist inhaler, prescription cough suppressant and OTC cough suppressant for the symptoms. The treatment provided mild relief. Her past medical history is significant for asthma.    Past Medical History  Diagnosis Date  . Diabetes mellitus type II 08/2009 dx  . Morbid obesity     s/p lab band 09/2010  . Knee pain, right   . Asthma   . Allergic rhinitis, cause unspecified   . Arthritis      Review of Systems  Constitutional: Positive for fever and chills.  HENT: Positive for sore throat. Negative for postnasal drip.   Respiratory: Positive for cough, shortness of breath and wheezing. Negative for hemoptysis.   Cardiovascular: Negative for chest pain.  Gastrointestinal: Negative for heartburn.  Musculoskeletal: Positive for myalgias.  Neurological: Positive for headaches.       Objective:   Physical Exam BP 112/88  Pulse 70  Temp(Src) 99 F (37.2 C) (Oral)  SpO2 94%    Gen: Mild ill appearing, no acute distress HEENT: Sinuses nontender, oropharynx red without exudate Lungs: Bilateral rhonchi, expiratory wheeze but no increased work of breathing at rest Cardiovascular: Distant but regular rate and rhythm, no bilateral lower extremity edema      Assessment & Plan:  Acute asthma exacerbation with bronchitis Cough symptoms do to same  IM Medrol shot today Augmentin twice a day x10 days Continue ongoing asthma  therapy Salt gargle as needed, alternate over-the-counter Tylenol and ibuprofen for myalgias and fever as needed Rest, hydration Work note provided

## 2011-04-17 ENCOUNTER — Other Ambulatory Visit: Payer: Self-pay

## 2011-04-17 MED ORDER — FLUCONAZOLE 150 MG PO TABS: 150.0000 mg | ORAL_TABLET | Freq: Once | ORAL | Status: AC

## 2011-04-17 NOTE — Telephone Encounter (Signed)
Pt called requesting Rx for Diflucan, please advise

## 2011-04-17 NOTE — Telephone Encounter (Signed)
ok 

## 2011-04-17 NOTE — Telephone Encounter (Signed)
Done. LMOM to inform patient. 

## 2011-05-22 ENCOUNTER — Encounter (INDEPENDENT_AMBULATORY_CARE_PROVIDER_SITE_OTHER): Payer: Self-pay | Admitting: Surgery

## 2011-05-22 ENCOUNTER — Ambulatory Visit (INDEPENDENT_AMBULATORY_CARE_PROVIDER_SITE_OTHER): Payer: BC Managed Care – PPO | Admitting: Surgery

## 2011-05-22 DIAGNOSIS — Z9884 Bariatric surgery status: Secondary | ICD-10-CM

## 2011-05-22 NOTE — Progress Notes (Addendum)
Lap Band Patient  ASSESSMENT AND PLAN: 1. Morbidly obese.   Status post lap band placement. AP large.  10/02/2010.  Initial weight 349, BMI - 52.9.  Her weight is at 309, unchanged from when I last saw her.  But she is otherwise doing well.  I will see her back in 10 weeks.  2. Non-insulin-dependent diabetes mellitus. Off meds. 3. Arthritis of right knee.  4. Episodic asthma.  5. She is increasing her exercise. Her exercise has been interrupted by illness.  (She was in the BELT program at one time.)   HISTORY OF PRESENT ILLNESS: Chief Complaint  Patient presents with  . Lap Band Fill    Debra [name] is a 35 y.o. (DOB: 01-19-1976)  AA female who is a patient of Rene Paci, MD, MD and comes to me today for follow up of lap band.  The patient is in good spirits. She has been ill twice in the last couple of months.   She got sick with asthma once, then the flu another time. This has interrupted her work out schedule.  We talked about eating some solid food for breakfast.  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  She is then going on to graduate school.  PHYSICAL EXAM: BP 148/96  Pulse 60  Temp(Src) 97.4 F (36.3 C) (Temporal)  Resp 16  Ht 5\' 8"  (1.727 m)  Wt 309 lb 12.8 oz (140.524 kg)  BMI 47.10 kg/m2  Lungs: Are clear to auscultation.  Abdomen: Her incisions are well-healed. The port is palpable. She has no hernia or mass.   Procedure:  While in the office, I accessed her lap band. (which was mobile).  She has 3.2 cc in the band.  I added 2.5 cc.  She tolerated liquids after the band fill.  DATA REVIEWED: None

## 2011-05-22 NOTE — Patient Instructions (Signed)
1.  Stay on liquids for 2 days.  Then add solids back to diet.

## 2011-06-25 ENCOUNTER — Encounter: Payer: Self-pay | Admitting: *Deleted

## 2011-06-25 ENCOUNTER — Encounter: Payer: BC Managed Care – PPO | Admitting: *Deleted

## 2011-06-25 NOTE — Patient Instructions (Signed)
Goals:  Follow Phase 3B: High Protein + Non-Starchy Vegetables  Eat 3-6 small meals/snacks, every 3-5 hrs  Increase lean protein foods to meet 60-80g goal  Increase fluid intake to 64oz +  Add 15 grams of carbohydrate (fruit, whole grain, starchy vegetable) with meals  Avoid drinking 15 minutes before, during and 30 minutes after eating  Aim for >30 min of physical activity daily  

## 2011-07-22 ENCOUNTER — Other Ambulatory Visit (INDEPENDENT_AMBULATORY_CARE_PROVIDER_SITE_OTHER): Payer: BC Managed Care – PPO

## 2011-07-22 ENCOUNTER — Encounter: Payer: Self-pay | Admitting: Internal Medicine

## 2011-07-22 ENCOUNTER — Ambulatory Visit (INDEPENDENT_AMBULATORY_CARE_PROVIDER_SITE_OTHER): Payer: BC Managed Care – PPO | Admitting: Internal Medicine

## 2011-07-22 VITALS — BP 132/78 | HR 58 | Temp 99.3°F | Resp 16 | Ht 68.0 in | Wt 314.0 lb

## 2011-07-22 DIAGNOSIS — E119 Type 2 diabetes mellitus without complications: Secondary | ICD-10-CM

## 2011-07-22 DIAGNOSIS — J45909 Unspecified asthma, uncomplicated: Secondary | ICD-10-CM

## 2011-07-22 DIAGNOSIS — Z0000 Encounter for general adult medical examination without abnormal findings: Secondary | ICD-10-CM

## 2011-07-22 LAB — BASIC METABOLIC PANEL
BUN: 11 mg/dL (ref 6–23)
CO2: 27 mEq/L (ref 19–32)
Glucose, Bld: 116 mg/dL — ABNORMAL HIGH (ref 70–99)
Potassium: 3.8 mEq/L (ref 3.5–5.1)
Sodium: 141 mEq/L (ref 135–145)

## 2011-07-22 LAB — URINALYSIS
Bilirubin Urine: NEGATIVE
Hgb urine dipstick: NEGATIVE
Ketones, ur: NEGATIVE
Leukocytes, UA: NEGATIVE
Nitrite: NEGATIVE
Urobilinogen, UA: 0.2 (ref 0.0–1.0)

## 2011-07-22 LAB — LIPID PANEL
Cholesterol: 151 mg/dL (ref 0–200)
HDL: 49.6 mg/dL (ref >39.00)
VLDL: 11.6 mg/dL (ref 0.0–40.0)

## 2011-07-22 LAB — CBC WITH DIFFERENTIAL/PLATELET
Basophils Absolute: 0 10*3/uL (ref 0.0–0.1)
Eosinophils Absolute: 0.2 10*3/uL (ref 0.0–0.7)
HCT: 34.9 % — ABNORMAL LOW (ref 36.0–46.0)
Hemoglobin: 10.9 g/dL — ABNORMAL LOW (ref 12.0–15.0)
Lymphs Abs: 1.4 10*3/uL (ref 0.7–4.0)
MCHC: 31.2 g/dL (ref 30.0–36.0)
Monocytes Absolute: 0.7 10*3/uL (ref 0.1–1.0)
Neutro Abs: 5.9 10*3/uL (ref 1.4–7.7)
Platelets: 289 10*3/uL (ref 150.0–400.0)
RDW: 15.3 % — ABNORMAL HIGH (ref 11.5–14.6)

## 2011-07-22 LAB — TSH: TSH: 1.24 u[IU]/mL (ref 0.35–5.50)

## 2011-07-22 LAB — HEMOGLOBIN A1C: Hgb A1c MFr Bld: 7 % — ABNORMAL HIGH (ref 4.6–6.5)

## 2011-07-22 LAB — HEPATIC FUNCTION PANEL
Albumin: 3.3 g/dL — ABNORMAL LOW (ref 3.5–5.2)
Alkaline Phosphatase: 51 U/L (ref 39–117)
Total Protein: 6.5 g/dL (ref 6.0–8.3)

## 2011-07-22 MED ORDER — FLUTICASONE-SALMETEROL 250-50 MCG/DOSE IN AEPB: 1.0000 | INHALATION_SPRAY | Freq: Two times a day (BID) | RESPIRATORY_TRACT | Status: DC

## 2011-07-22 NOTE — Assessment & Plan Note (Signed)
Working on weight loss - doing well thus far! S/p Lap band 10/02/10 - start weight 349#  encouragement provided  Wt Readings from Last 3 Encounters:  07/22/11 314 lb (142.429 kg)  05/22/11 309 lb 12.8 oz (140.524 kg)  03/27/11 313 lb (141.976 kg)

## 2011-07-22 NOTE — Progress Notes (Signed)
Subjective:    Patient ID: Debra [name], female    DOB: 1976-01-25, 36 y.o.   MRN: [id number]  HPI  patient is here today for annual physical. Patient feels well overall -  Also reviewed chronic medical issues:  DM2 - new dx 08/2009 at [facility] - no metformin since 09/2010 (preop) - cbgs 80-100 - working with nutritionist - checks cbgs 2x/day  morbid obesity - s/p lab band 10/02/10 - start weight 340# preop - improving ability to exercise (walk or swim) due to improved pain in R knee-  seasonal allergic rhinitis - reports compliance with ongoing medical treatment and no changes in medication dose or frequency. denies adverse side effects related to current therapy. 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 Diabetes mellitus type II 08/2009 dx  . Morbid obesity     s/p lab band 09/2010  . Knee pain, right   . Asthma   . Allergic rhinitis, cause unspecified   . Arthritis    Family History  Problem Relation Age of Onset  . Heart disease Mother   . Hypertension Mother   . Stroke Mother   . Diabetes Father    History  Substance Use Topics  . Smoking status: Never Smoker   . Smokeless tobacco: Not on file  . Alcohol Use: No    Review of Systems Constitutional: Negative for fever or weight change.  Respiratory: Negative for cough and shortness of breath.   Cardiovascular: Negative for chest pain or palpitations.  Gastrointestinal: Negative for  abdominal pain, no bowel changes.  Musculoskeletal: Negative for gait problem or joint swelling.  Skin: Negative for rash.  Neurological: Negative for dizziness or headache.  No other specific complaints in a complete review of systems (except as listed in HPI above).      Objective:   Physical Exam  BP 132/78  Pulse 58  Temp(Src) 99.3 F (37.4 C) (Oral)  Resp 16  Ht 5\' 8"  (1.727 m)  Wt 314 lb (142.429 kg)  BMI 47.74 kg/m2  SpO2 98% Wt Readings from Last 3 Encounters:  07/22/11 314 lb (142.429 kg)  05/22/11 309 lb 12.8 oz (140.524 kg)  03/27/11 313 lb (141.976 kg)    Constitutional: She is MO, but appears well-developed and well-nourished. No distress.  HENT: Head: Normocephalic and atraumatic. Ears: B TMs ok, no erythema or effusion; Nose: Nose normal. Mouth/Throat: Oropharynx is clear and moist. No oropharyngeal exudate.  Eyes: Conjunctivae and EOM are normal. Pupils are equal, round, and reactive to light. No scleral icterus.  Neck: Normal range of motion. Neck supple. No JVD present. No thyromegaly present.  Cardiovascular: Normal rate, regular rhythm and normal heart sounds.  No murmur heard. No BLE edema. Pulmonary/Chest: Effort normal and breath sounds normal. No respiratory distress. She has no wheezes.  Abdominal: Soft. Bowel sounds are normal. She exhibits no distension. There is no tenderness. no masses Musculoskeletal: Normal range of motion,  no joint effusions. No gross deformities Neurological: She is alert and oriented to person, place, and time. No cranial nerve deficit. Coordination normal.  Skin: Skin is warm and dry. No rash noted. No erythema.  Psychiatric: She has a normal mood and affect. Her behavior is normal. Judgment and thought content normal.    Lab Results  Component Value Date   WBC 11.2* 02/28/2011   HGB 10.9* 02/28/2011   HCT 35.9* 02/28/2011   PLT 340 02/28/2011   CHOL 164 08/22/2009   TRIG 61.0 08/22/2009   HDL 38.90* 08/22/2009   ALT 13  09/27/2010   AST 13 09/27/2010   NA 138 02/28/2011   K 3.3* 02/28/2011   CL 104 02/28/2011   CREATININE 0.85 02/28/2011   BUN 13 02/28/2011   CO2 25 02/28/2011   TSH 1.91 08/22/2009   HGBA1C 6.2 11/28/2010        Assessment & Plan:  CPX/v70.0 - Patient has been counseled on age-appropriate routine health concerns for screening and prevention. These are reviewed and up-to-date. Immunizations are up-to-date or declined. Labs ordered and will be reviewed.   See problem list. Medications and labs reviewed today.

## 2011-07-22 NOTE — Assessment & Plan Note (Signed)
Recurrent flares and spring - no evidence of acute bronchospasm or infection today changed Dulera to steroid only inhaler 12/12 due to tremor side effects -but less well controlled with increase use rescue Alb MDI prn Add advair now - continue prn Alb

## 2011-07-22 NOTE — Assessment & Plan Note (Signed)
The current medical regimen is effective;  continue present plan and medications. no medication needs following lap band 09/2010 - diet control; off metformin since 11/2010 check a1c now and titrate and adjust as needed  Lab Results  Component Value Date   HGBA1C 6.2 11/28/2010

## 2011-07-22 NOTE — Patient Instructions (Signed)
It was good to see you today. Test(s) ordered today. Your results will be called to you after review (48-72hours after test completion). If any changes need to be made, you will be notified at that time. Change pulmicort to Advair for your asthma symptoms - Your prescription(s) have been submitted to your pharmacy. Please take as directed and contact our office if you believe you are having problem(s) with the medication(s). Other Medications reviewed, no changes at this time.  Please schedule followup in 4 months for weight, DM and asthma check; call sooner if problems.

## 2011-08-08 ENCOUNTER — Encounter (HOSPITAL_COMMUNITY): Payer: Self-pay | Admitting: *Deleted

## 2011-08-08 ENCOUNTER — Emergency Department (HOSPITAL_COMMUNITY): Payer: BC Managed Care – PPO

## 2011-08-08 ENCOUNTER — Emergency Department (HOSPITAL_COMMUNITY)
Admission: EM | Admit: 2011-08-08 | Discharge: 2011-08-08 | Disposition: A | Discharge status: Home / Self Care | Payer: BC Managed Care – PPO | Attending: Emergency Medicine | Admitting: Emergency Medicine

## 2011-08-08 DIAGNOSIS — R109 Unspecified abdominal pain: Secondary | ICD-10-CM | POA: Insufficient documentation

## 2011-08-08 DIAGNOSIS — Z79899 Other long term (current) drug therapy: Secondary | ICD-10-CM | POA: Insufficient documentation

## 2011-08-08 DIAGNOSIS — R197 Diarrhea, unspecified: Secondary | ICD-10-CM | POA: Insufficient documentation

## 2011-08-08 DIAGNOSIS — R112 Nausea with vomiting, unspecified: Secondary | ICD-10-CM

## 2011-08-08 DIAGNOSIS — M129 Arthropathy, unspecified: Secondary | ICD-10-CM | POA: Insufficient documentation

## 2011-08-08 DIAGNOSIS — R509 Fever, unspecified: Secondary | ICD-10-CM | POA: Insufficient documentation

## 2011-08-08 DIAGNOSIS — R6883 Chills (without fever): Secondary | ICD-10-CM | POA: Insufficient documentation

## 2011-08-08 DIAGNOSIS — J45909 Unspecified asthma, uncomplicated: Secondary | ICD-10-CM | POA: Insufficient documentation

## 2011-08-08 DIAGNOSIS — E119 Type 2 diabetes mellitus without complications: Secondary | ICD-10-CM | POA: Insufficient documentation

## 2011-08-08 LAB — CBC
HCT: 39.5 % (ref 36.0–46.0)
Hemoglobin: 12.1 g/dL (ref 12.0–15.0)
MCHC: 30.6 g/dL (ref 30.0–36.0)
MCV: 78.2 fL (ref 78.0–100.0)

## 2011-08-08 LAB — POCT PREGNANCY, URINE: Preg Test, Ur: NEGATIVE

## 2011-08-08 LAB — COMPREHENSIVE METABOLIC PANEL
BUN: 12 mg/dL (ref 6–23)
CO2: 21 mEq/L (ref 19–32)
Chloride: 107 mEq/L (ref 96–112)
Creatinine, Ser: 0.81 mg/dL (ref 0.50–1.10)
GFR calc non Af Amer: >90 mL/min (ref >90)
Total Bilirubin: 0.3 mg/dL (ref 0.3–1.2)

## 2011-08-08 LAB — DIFFERENTIAL
Basophils Relative: 0 % (ref 0–1)
Eosinophils Relative: 1 % (ref 0–5)
Monocytes Absolute: 0.3 10*3/uL (ref 0.1–1.0)
Monocytes Relative: 3 % (ref 3–12)
Neutro Abs: 9.2 10*3/uL — ABNORMAL HIGH (ref 1.7–7.7)

## 2011-08-08 LAB — URINALYSIS, ROUTINE W REFLEX MICROSCOPIC
Bilirubin Urine: NEGATIVE
Hgb urine dipstick: NEGATIVE
Ketones, ur: NEGATIVE mg/dL
Protein, ur: NEGATIVE mg/dL
Specific Gravity, Urine: 1.021 (ref 1.005–1.030)
Urobilinogen, UA: 0.2 mg/dL (ref 0.0–1.0)

## 2011-08-08 LAB — LIPASE, BLOOD: Lipase: 28 U/L (ref 11–59)

## 2011-08-08 MED ORDER — ONDANSETRON HCL 4 MG/2ML IJ SOLN
4.0000 mg | Freq: Once | INTRAMUSCULAR | Status: AC
  Administered 2011-08-08: 4 mg via INTRAVENOUS

## 2011-08-08 MED ORDER — SODIUM CHLORIDE 0.9 % IV BOLUS (SEPSIS)
1000.0000 mL | Freq: Once | INTRAVENOUS | Status: AC
  Administered 2011-08-08: 1000 mL via INTRAVENOUS

## 2011-08-08 MED ORDER — ONDANSETRON 8 MG PO TBDP: 8.0000 mg | ORAL_TABLET | Freq: Three times a day (TID) | ORAL | Status: AC | PRN

## 2011-08-08 MED ORDER — HYDROCODONE-ACETAMINOPHEN 5-325 MG PO TABS: ORAL_TABLET | ORAL | Status: DC

## 2011-08-08 MED ORDER — MORPHINE SULFATE 4 MG/ML IJ SOLN
4.0000 mg | Freq: Once | INTRAMUSCULAR | Status: AC
  Administered 2011-08-08: 4 mg via INTRAVENOUS
  Filled 2011-08-08: qty 1

## 2011-08-08 MED ORDER — METOCLOPRAMIDE HCL 5 MG/ML IJ SOLN
10.0000 mg | Freq: Once | INTRAMUSCULAR | Status: AC
  Administered 2011-08-08: 10 mg via INTRAVENOUS
  Filled 2011-08-08: qty 2

## 2011-08-08 MED ORDER — ONDANSETRON HCL 4 MG/2ML IJ SOLN
4.0000 mg | Freq: Once | INTRAMUSCULAR | Status: AC
  Administered 2011-08-08: 4 mg via INTRAVENOUS
  Filled 2011-08-08: qty 2

## 2011-08-08 NOTE — ED Notes (Signed)
Pt drinking contrast fluids

## 2011-08-08 NOTE — ED Notes (Signed)
Per EMS: pt coming from home with c/o n/v/d. Pt reports vomiting right before EMS arrived. EMS gave pt 4 mg of zofran with relief. Pt reports throwing up 5 times and having diarrhea 5 times. Pt denies any dizziness or lightheadedness.

## 2011-08-08 NOTE — ED Provider Notes (Signed)
History     CSN: [id number]  Arrival date & time 08/08/11  0865   First MD Initiated Contact with Patient 08/08/11 [phone]      Chief Complaint  Patient presents with  . Nausea  . Emesis  . Diarrhea    (Consider location/radiation/quality/duration/timing/severity/associated sxs/prior treatment) HPI Comments: 36 yo old black female with history of type II DM and s/p lap banding surgery 09/2010 presents this morning accompanied by her mother for n/v/d and abdominal pain.  Patient states that abdominal pain started at 11:30 last evening, approximately 1 hour after her last meal.  Non bloody diarrhea began soon after, followed two hours later by vomiting.  Patient had approximately 5 bm and has vomited 5 times before arriving here this morning. Has tried consumption of vinegar and water without benefit. Reports recent sick contacts with n/v/d. Reports chills. Denies recent abx use, recent travel.   Patient is a 36 y.o. female presenting with vomiting and diarrhea. The history is provided by the patient and a relative.  Emesis  This is a new problem. The current episode started 6 to 12 hours ago. The problem occurs 5 to 10 times per day. The problem has not changed since onset.The emesis has an appearance of stomach contents. The maximum temperature recorded prior to her arrival was 100 to 100.9 F. Associated symptoms include abdominal pain, chills, diarrhea and a fever. Pertinent negatives include no cough, no headaches and no myalgias.  Diarrhea The primary symptoms include fever, abdominal pain, nausea, vomiting and diarrhea. Primary symptoms do not include dysuria, myalgias or rash.  The illness is also significant for chills.    Past Medical History  Diagnosis Date  . Diabetes mellitus type II 08/2009 dx  . Morbid obesity     s/p lab band 09/2010  . Knee pain, right   . Asthma   . Allergic rhinitis, cause unspecified   . Arthritis     Past Surgical History  Procedure Date  .  Laparoscopic gastric banding 10/02/2010    start weight 349#    Family History  Problem Relation Age of Onset  . Heart disease Mother   . Hypertension Mother   . Stroke Mother   . Diabetes Father     History  Substance Use Topics  . Smoking status: Never Smoker   . Smokeless tobacco: Not on file  . Alcohol Use: No    OB History    Grav Para Term Preterm Abortions TAB SAB Ect Mult Living                  Review of Systems  Constitutional: Positive for fever and chills.  HENT: Negative for sore throat and rhinorrhea.   Eyes: Negative for redness.  Respiratory: Negative for cough.   Cardiovascular: Negative for chest pain.  Gastrointestinal: Positive for nausea, vomiting, abdominal pain and diarrhea. Negative for blood in stool.  Genitourinary: Negative for dysuria.  Musculoskeletal: Negative for myalgias.  Skin: Negative for rash.  Neurological: Negative for headaches.    Allergies  Review of patient's allergies indicates no known allergies.  Home Medications   Current Outpatient Rx  Name Route Sig Dispense Refill  . ALBUTEROL SULFATE HFA 108 (90 BASE) MCG/ACT IN AERS Inhalation Inhale 2 puffs into the lungs every 4 (four) hours as needed.      [name] CALCIUM CITRATE 950 MG PO TABS Oral Take 1 tablet by mouth 2 (two) times daily.      [name] FEXOFENADINE HCL 180 MG PO  TABS Oral Take 180 mg by mouth daily as needed.      [name] FLUTICASONE-SALMETEROL 250-50 MCG/DOSE IN AEPB Inhalation Inhale 1 puff into the lungs 2 (two) times daily. 1 each 3  . MOMETASONE FUROATE 50 MCG/ACT NA SUSP Nasal 2 sprays by Nasal route every morning.      . MULTI-VITAMIN/MINERALS PO TABS Oral Take 1 tablet by mouth 2 (two) times daily.       BP 156/94  Pulse 78  Temp(Src) 100.9 F (38.3 C) (Oral)  Resp 20  SpO2 100%  LMP 07/28/2011  Physical Exam  Nursing note and vitals reviewed. Constitutional: She is oriented to person, place, and time. She appears well-developed and well-nourished.  HENT:    Head: Normocephalic and atraumatic.  Eyes: Conjunctivae are normal. Right eye exhibits no discharge. Left eye exhibits no discharge.  Neck: Normal range of motion. Neck supple.  Cardiovascular: Normal rate, regular rhythm and normal heart sounds.   Pulmonary/Chest: Effort normal and breath sounds normal.  Abdominal: Soft. There is no tenderness.  Neurological: She is alert and oriented to person, place, and time.  Skin: Skin is warm and dry.  Psychiatric: She has a normal mood and affect.    ED Course  Procedures (including critical care time)  Labs Reviewed  URINALYSIS, ROUTINE W REFLEX MICROSCOPIC - Abnormal; Notable for the following:    APPearance CLOUDY (*)    All other components within normal limits  GLUCOSE, CAPILLARY - Abnormal; Notable for the following:    Glucose-Capillary 147 (*)    All other components within normal limits  CBC - Abnormal; Notable for the following:    MCH 24.0 (*)    All other components within normal limits  DIFFERENTIAL - Abnormal; Notable for the following:    Neutrophils Relative 93 (*)    Neutro Abs 9.2 (*)    Lymphocytes Relative 4 (*)    Lymphs Abs 0.4 (*)    All other components within normal limits  COMPREHENSIVE METABOLIC PANEL - Abnormal; Notable for the following:    Glucose, Bld 141 (*)    Calcium 8.2 (*)    Albumin 3.3 (*)    All other components within normal limits  POCT PREGNANCY, URINE  LIPASE, BLOOD   Ct Abdomen Pelvis W Contrast  08/08/2011  *RADIOLOGY REPORT*  Clinical Data: Ileus, lap band  CT ABDOMEN AND PELVIS WITH CONTRAST  Technique:  Multidetector CT imaging of the abdomen and pelvis was performed following the standard protocol during bolus administration of intravenous contrast.  Contrast:  100 ml Omni 300  Comparison: None.  Findings: The lung bases are clear.  The lap band is in place at the EG junction with an uncomplicated appearance.  The liver, gallbladder, spleen, pancreas urinary bladder have a normal  appearance.  The uterus has a lobular contour, likely secondary to fibroid disease.  There are degenerative changes within the axial spine.  There is no adenopathy or free fluid within the abdomen or pelvis.  The bowel is unremarkable with no evidence of gross inflammation or obstruction.  There is a 7.1 cm cyst in the right adnexal region.  There is also a tubular structure in the left adnexa.  IMPRESSION: 7.1 cm right adnexal cystic mass.  This is likely from the right ovary.  A mucocele could have a similar appearance. Cystic tubular structure in the left adnexa, likely a hydrosalpinx.  Pelvic ultrasound is recommended for better characterization.  Probable uterine fibroids.  Original Report Authenticated By: Karl Ito  DOVER, M.D.   Dg Abd Acute W/chest  08/08/2011  *RADIOLOGY REPORT*  Clinical Data: History of lap band, nausea, vomiting, diarrhea  ACUTE ABDOMEN SERIES (ABDOMEN 2 VIEW & CHEST 1 VIEW)  Comparison: Chest x-ray of 09/27/2010 and abdomen films of 10/02/2010  Findings: The lungs are clear.  The heart is within upper limits of normal.  No bony abnormality is seen.  The lap band remains in good position extending from 2 o'clock to 8 o'clock position.  On the erect view both large and small bowel slight gaseous distention is present with scattered air-fluid levels.  This pattern is most consistent with mild ileus. Developing partial obstruction cannot be excluded.  No free air is seen on the erect view.  The catheter tubing to the port in the right abdomen appears intact.  IMPRESSION:  1.  Probable mild ileus.  Recommend follow-up to exclude developing obstruction. 2.  No active lung disease.  Original Report Authenticated By: Juline Patch, M.D.     1. Nausea vomiting and diarrhea     7:14 AM Patient seen and examined. Work-up initiated. Medications ordered.   Vital signs reviewed and are as follows: Filed Vitals:   08/08/11 0630  BP: 156/94  Pulse: 78  Temp: 100.9 F (38.3 C)  Resp:  20   Temp noted.   8:44 AM Pt feeling better. Will PO challenge. X-ray reviewed by myself.   9:54 AM Patient feeling nauseous. CT ordered to eval for obstruction.   CT scan was reviewed by myself. Discussed with Dr. Jeraldine Loots. Patient was informed of results including incidental cyst. Patient is tolerating PO's including the contrast and later a small amount of soda in the room. She states she had another bowel movement which was not watery. She appears well and has been up walking in the halls without difficulty.   The patient was urged to return to the Emergency Department immediately with worsening of current symptoms, worsening abdominal pain, persistent vomiting, blood noted in stools, fever, or any other concerns. The patient verbalized understanding.   Patient counseled on use of narcotic pain medications. Counseled not to combine these medications with others containing tylenol. Urged not to drink alcohol, drive, or perform any other activities that requires focus while taking these medications. The patient verbalizes understanding and agrees with the plan.  BP 154/65  Pulse 80  Temp(Src) 98.5 F (36.9 C) (Oral)  Resp 16  SpO2 100%  LMP 07/28/2011   MDM  Pt with lap band, abd pain, N/V. CT neg for obstructive. Patient likely has viral gastroenteritis. Will treat as such. Strict return instructions given.         Renne Crigler, [place] 08/08/11 [phone]

## 2011-08-08 NOTE — ED Notes (Signed)
Pt took a few sips of water, presently in the bathroom and reports nausea. md alerted.

## 2011-08-08 NOTE — ED Notes (Signed)
Pt states she ate some food around 10 pm last night. Pt reports eating some rice, steak, chicken that had been in the fridge for six days. Pt reports abdominal pain, nausea approximately 30 min after eating her meal. Pt reports her first episode of vomiting was at 2:30 am.

## 2011-08-08 NOTE — ED Notes (Signed)
md at bedside  Pt alert and oriented x4. Respirations even and unlabored, bilateral symmetrical rise and fall of chest. Skin warm and dry. In no acute distress. Denies needs.   

## 2011-08-08 NOTE — ED Notes (Signed)
WGN:FA21<HY> Expected date:08/08/11<BR> Expected time: 5:59 AM<BR> Means of arrival:Ambulance<BR> Comments:<BR> N,v,d

## 2011-08-08 NOTE — ED Notes (Signed)
Pt to xray

## 2011-08-08 NOTE — ED Notes (Signed)
Pt is aware of the need for urine. 

## 2011-08-08 NOTE — ED Notes (Signed)
Pt alert and oriented x4. Respirations even and unlabored, bilateral symmetrical rise and fall of chest. Skin warm and dry. In no acute distress. Denies needs.   

## 2011-08-08 NOTE — ED Notes (Signed)
Pt back from xray. Requesting fluids, pt told she can not have anything by mouth until results come back. rn will give pt mouth swab for now.

## 2011-08-08 NOTE — Discharge Instructions (Signed)
Please read and follow all provided instructions.  Your diagnoses today include:  1. Nausea vomiting and diarrhea     Tests performed today include:  Blood counts and electrolytes  Blood tests to check liver and kidney function  Blood tests to check pancreas function  Urine test to look for infection and pregnancy (in women)  CT scan that showed a cyst on your right ovary but was otherwise normal  Vital signs. See below for your results today.   Medications prescribed:   Vicodin (hydrocodone/acetaminophen) - narcotic pain medication  You have been prescribed narcotic pain medication such as Vicodin or Percocet: DO NOT drive or perform any activities that require you to be awake and alert because this medicine can make you drowsy. BE VERY CAREFUL not to take multiple medicines containing Tylenol (also called acetaminophen). Doing so can lead to an overdose which can damage your liver and cause liver failure and possibly death.    Zofran (ondansetron) - for nausea and vomiting  Take any prescribed medications only as directed.  Home care instructions:   Follow any educational materials contained in this packet.   Your abdominal pain, nausea, vomiting, and diarrhea may be caused by a viral gastroenteritis also called 'stomach flu'. You should rest for the next several days. Keep drinking plenty of fluids and use the medicine for nausea as directed.    Drink clear liquids for the next 24 hours and introduce solid foods slowly after 24 hours using the b.r.a.t. diet (see attached information sheet).    Follow-up instructions: Please follow-up with your primary care provider in the next 2 days for further evaluation of your symptoms. If you are not feeling better in 48 hours you may have a condition that is more serious and you need re-evaluation. If you do not have a primary care doctor -- see below for referral information.   Return instructions:  SEEK IMMEDIATE MEDICAL  ATTENTION IF:  If you have pain that does not go away or becomes severe   A temperature above 101F develops   Repeated vomiting occurs (multiple episodes)   If you have pain that becomes localized to portions of the abdomen. The right side could possibly be appendicitis. In an adult, the left lower portion of the abdomen could be colitis or diverticulitis.   Blood is being passed in stools or vomit (bright red or black tarry stools)   You develop chest pain, difficulty breathing, dizziness or fainting, or become confused, poorly responsive, or inconsolable (young children)  If you have any other emergent concerns regarding your health  Additional Information: Abdominal (belly) pain can be caused by many things. Your caregiver performed an examination and possibly ordered blood/urine tests and imaging (CT scan, x-rays, ultrasound). Many cases can be observed and treated at home after initial evaluation in the emergency department. Even though you are being discharged home, abdominal pain can be unpredictable. Therefore, you need a repeated exam if your pain does not resolve, returns, or worsens. Most patients with abdominal pain don't have to be admitted to the hospital or have surgery, but serious problems like appendicitis and gallbladder attacks can start out as nonspecific pain. Many abdominal conditions cannot be diagnosed in one visit, so follow-up evaluations are very important.  Your vital signs today were: BP 155/69  Pulse 76  Temp(Src) 98.7 F (37.1 C) (Oral)  Resp 18  SpO2 98%  LMP 07/28/2011 If your blood pressure (bp) was elevated above 135/85 this visit, please have this repeated  by your doctor within one month. -------------- No Primary Care Doctor Call [facility]  [phone] Other agencies that provide inexpensive medical care    [facility]  [phone]    [facility]  [phone]    [facility]  [phone]    [facility]   [phone]    [facility]  [phone]    [facility]  [phone] -------------- RESOURCE GUIDE:  Dental Problems  Patients with Medicaid: [facility] [phone] [address]                                            [phone] [address] Phone:  [phone]                                                   Phone:  [phone]  If unable to pay or uninsured, contact:  [facility] or [facility]. to become qualified for the adult dental clinic.  Chronic Pain Problems Contact [facility]  [phone] Patients need to be referred by their primary care doctor.  Insufficient Money for Medicine Contact [employer]:  call "211" or [facility] [phone].  Psychological Services [facility]  [phone] [facility]  [phone] [facility]   [phone] (emergency services [phone])  Substance Abuse Resources [facility]  [phone] [facility] [phone] [facility] [phone] Floydene Flock [phone] Residential & Outpatient Substance Abuse Program  [phone]  Abuse/Neglect [facility] Child Abuse Hotline [phone])203-7292 [facility] Child Abuse Hotline (502)841-3550 (After Hours)  Emergency Shelter [facility] [phone]  Maternity Homes Room at the [place] [facility] [place] [phone] [place] [facility] [phone]  [facility] Health Resources  [facility] [place]     [employer]                          [facility] Dept. [address] [place]                       [address]      [address]  Blondell Reveal Phone:  [phone]                                   Phone:  [phone]                 Phone:  [phone]  [facility] Mental  Health Phone:  [phone]  [facility] Child Abuse Hotline (708)241-4464 [phone] (After Hours)

## 2011-08-08 NOTE — ED Notes (Signed)
Pt to CT

## 2011-08-09 NOTE — ED Provider Notes (Signed)
Medical screening examination/treatment/procedure(s) were performed by non-physician practitioner and as supervising physician I was immediately available for consultation/collaboration.   Rudi Knippenberg, MD 08/09/11 0734 

## 2011-08-26 ENCOUNTER — Encounter: Payer: Self-pay | Admitting: *Deleted

## 2011-08-26 ENCOUNTER — Encounter: Payer: Self-pay | Admitting: Internal Medicine

## 2011-08-26 ENCOUNTER — Ambulatory Visit (INDEPENDENT_AMBULATORY_CARE_PROVIDER_SITE_OTHER): Payer: BC Managed Care – PPO | Admitting: Internal Medicine

## 2011-08-26 VITALS — BP 162/90 | HR 70 | Temp 98.5°F | Ht 66.0 in | Wt 317.0 lb

## 2011-08-26 DIAGNOSIS — F411 Generalized anxiety disorder: Secondary | ICD-10-CM

## 2011-08-26 DIAGNOSIS — F419 Anxiety disorder, unspecified: Secondary | ICD-10-CM

## 2011-08-26 DIAGNOSIS — I10 Essential (primary) hypertension: Secondary | ICD-10-CM

## 2011-08-26 DIAGNOSIS — N83209 Unspecified ovarian cyst, unspecified side: Secondary | ICD-10-CM

## 2011-08-26 MED ORDER — ALPRAZOLAM 0.25 MG PO TABS: 0.2500 mg | ORAL_TABLET | Freq: Two times a day (BID) | ORAL | Status: DC | PRN

## 2011-08-26 MED ORDER — LOSARTAN POTASSIUM 100 MG PO TABS: 100.0000 mg | ORAL_TABLET | Freq: Every day | ORAL | Status: DC

## 2011-08-26 NOTE — Patient Instructions (Addendum)
It was good to see you today. Start losartan everyday for high blood pressure - avoid pregnancy while on this medication Also use low dose generic Xanax as needed for stress and anxiety spells - Your prescription(s) have been submitted to your pharmacy. Please take as directed and contact our office if you believe you are having problem(s) with the medication(s). Work excuse note for today provided Other Medications reviewed, no changes at this time.  Please schedule followup in 2-4 weeks for blood pressure and medication check; call sooner if problems.

## 2011-08-26 NOTE — Assessment & Plan Note (Signed)
Stress induced? No personal hx same, but strong FH Treatment of anxiety component as above, support offered Also start ARB - we reviewed potential risk/benefit and possible side effects - pt understands and agrees to same  recheck BP in 2-4 weeks and adjust meds as needed BP Readings from Last 3 Encounters:  08/26/11 162/90  08/08/11 154/65  07/22/11 132/78

## 2011-08-26 NOTE — Progress Notes (Signed)
  Subjective:    Patient ID: Debra [name], female    DOB: 1976-03-06, 36 y.o.   MRN: [id number]  Hypertension Pertinent negatives include no chest pain or palpitations.   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 Diabetes mellitus type II 08/2009 dx  . Morbid obesity     s/p lab band 09/2010  . Knee pain, right   . Asthma   . Allergic rhinitis, cause unspecified     Review of Systems  Cardiovascular: Negative for chest pain, palpitations and leg swelling.  Psychiatric/Behavioral: Negative for hallucinations, self-injury, dysphoric mood and decreased concentration. The patient is nervous/anxious.        Objective:   Physical Exam  BP 162/90  Pulse 70  Temp(Src) 98.5 F (36.9 C) (Oral)  Ht 5\' 6"  (1.676 m)  Wt 317 lb (143.79 kg)  BMI 51.17 kg/m2  SpO2 99%  LMP 07/28/2011 Wt Readings from Last 3 Encounters:  08/26/11 317 lb (143.79 kg)  07/22/11 314 lb (142.429 kg)  05/22/11 309 lb 12.8 oz (140.524 kg)    Constitutional: She is MO, but appears well-developed and well-nourished. No distress.  Neck: Normal range of motion. Neck supple. No JVD present. No thyromegaly present.  Cardiovascular: Normal rate, regular rhythm and normal heart sounds.  No murmur heard. No BLE edema. Pulmonary/Chest: Effort normal and breath sounds normal. No respiratory distress. She has no wheezes.  Psychiatric: She has a normal mood and affect. Her behavior is normal. Judgment and thought content normal.    Lab Results  Component Value Date   WBC 9.9 08/08/2011   HGB 12.1 08/08/2011   HCT 39.5 08/08/2011   PLT 317 08/08/2011   CHOL 151 07/22/2011   TRIG 58.0 07/22/2011   HDL 49.60 07/22/2011   ALT 12 08/08/2011   AST 11 08/08/2011   NA 140 08/08/2011   K 3.5  08/08/2011   CL 107 08/08/2011   CREATININE 0.81 08/08/2011   BUN 12 08/08/2011   CO2 21 08/08/2011   TSH 1.24 07/22/2011   HGBA1C 7.0* 07/22/2011        Assessment & Plan:   See problem list. Medications and labs reviewed today.  Ovarian cyst - incidental on CT 07/2011 - working with gyn in [place] for same, planning resection - reviewed with pt, support offered  Anxiety, appears situatinl, driving some of elevated blood pressure but not all - declines SSRI at this time, low dose prn BZ provided - also to consider counseling as needed - follow up 2-4 weeks on same with BP recheck

## 2011-08-29 ENCOUNTER — Encounter (INDEPENDENT_AMBULATORY_CARE_PROVIDER_SITE_OTHER): Payer: Self-pay | Admitting: Surgery

## 2011-08-29 ENCOUNTER — Ambulatory Visit (INDEPENDENT_AMBULATORY_CARE_PROVIDER_SITE_OTHER): Payer: BC Managed Care – PPO | Admitting: Surgery

## 2011-08-29 VITALS — BP 130/82 | HR 60 | Resp 18 | Ht 68.0 in | Wt 312.4 lb

## 2011-08-29 DIAGNOSIS — Z9884 Bariatric surgery status: Secondary | ICD-10-CM

## 2011-08-29 NOTE — Progress Notes (Signed)
Name:  Debra [name] MRN:  [id number] DOB:  [birth year]  Lap Band Patient  ASSESSMENT AND PLAN: 1. Morbidly obese.   Status post lap band placement. AP large.  10/02/2010.  Initial weight 349, BMI - 52.9.  Her weight is at 312, which is up 3 pounds from when I last saw her.  She is stable.  I did adjust her today.  She is to have surgery in about 6 weeks, so I will see her back in 3 months.  She knows to call us if she has any  2. Non-insulin-dependent diabetes mellitus. Off meds. 3. Arthritis of right knee.  4. Episodic asthma. This has been worse. This has limited her exercise. Dr. Felicity Coyer is working on this. 5. She is scheduled for a right oophectectomy and uterine fibroids to be excised 10/08/2011 by Dr. Orvan Falconer in [facility]. 6.  Dr. Bayard Hugger mentioned that Debra [name] is under some stress, but we did not talk about specifics today.  HISTORY OF PRESENT ILLNESS: Chief Complaint  Patient presents with  . Lap Band Fill    Debra [name] is a 36 y.o. (DOB: [birth year])  AA female who is a patient of Rene Paci, MD, MD and comes to me today for follow up of lap band.  The patient is in good spirits. Though she says that she has been under stress.  She is to have an oophorectomy/uterine fibroids 10/08/2011 in [facility].  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  She is then going on to graduate school.  PHYSICAL EXAM: BP 130/82  Pulse 60  Resp 18  Ht 5\' 8"  (1.727 m)  Wt 312 lb 6 oz (141.692 kg)  BMI 47.50 kg/m2  LMP 07/28/2011  Lungs: Are clear to auscultation.  Abdomen: Her incisions are well-healed. The port is palpable. She has no hernia or mass.   DATA REVIEWED: None

## 2011-09-02 ENCOUNTER — Telehealth: Payer: Self-pay

## 2011-09-02 MED ORDER — SULFAMETHOXAZOLE-TRIMETHOPRIM 800-160 MG PO TABS: 1.0000 | ORAL_TABLET | Freq: Two times a day (BID) | ORAL | Status: AC

## 2011-09-02 NOTE — Telephone Encounter (Signed)
Septra ds bid x 1 wk - erx done

## 2011-09-02 NOTE — Telephone Encounter (Signed)
Pt called requesting Rx for ABX to treat recurrent abscess under TR breast.

## 2011-09-03 NOTE — Telephone Encounter (Signed)
Pt advised of Rx/pharmacy 

## 2011-09-05 ENCOUNTER — Encounter: Payer: Self-pay | Admitting: Internal Medicine

## 2011-09-05 ENCOUNTER — Ambulatory Visit (INDEPENDENT_AMBULATORY_CARE_PROVIDER_SITE_OTHER): Payer: BC Managed Care – PPO | Admitting: Internal Medicine

## 2011-09-05 VITALS — BP 152/90 | HR 65 | Temp 98.8°F

## 2011-09-05 DIAGNOSIS — L02211 Cutaneous abscess of abdominal wall: Secondary | ICD-10-CM

## 2011-09-05 DIAGNOSIS — L03319 Cellulitis of trunk, unspecified: Secondary | ICD-10-CM

## 2011-09-05 NOTE — Progress Notes (Signed)
  Subjective:    Patient ID: Debra [name], female    DOB: [birth year], 36 y.o.   MRN: [id number]  HPI  complains of recurrent boil under R breast I&D here >84mo ago same location Begun antibiotics 48h ago with improvement in pain  No fever, no spontaneous drainage  Past Medical History  Diagnosis Date  . Diabetes mellitus type II 08/2009 dx  . Morbid obesity     s/p lab band 09/2010  . Knee pain, right   . Asthma   . Allergic rhinitis, cause unspecified   . Ovarian cyst     Surgery planned October 08, 2011   Review of Systems  Constitutional: Negative for chills and fatigue.  Skin: Negative for color change and rash.       Objective:   Physical Exam BP 152/90  Pulse 65  Temp(Src) 98.8 F (37.1 C) (Oral)  SpO2 98%  LMP 07/28/2011 Gen: NAD Skin - large abscess over right upper quad beneath R breast 3cmx2cm - no cellulitis - evidence of mild keloid at prior incision site adjacent to current site  Procedure Note: I&D, abscess - complex  the patient elects to proceed after verbal and written consent is obtained. the patient informed of possible risks and complications. Using sterile technique throughout, anesthesia achieved and I&D of abscess performed. Copious amounts of purulent material expressed followed by irrigation of the wound. Iodoform packing placed into the wound. the patient tolerated the procedure well. Wound care instructions provided. assistance by Dr. Posey Rea in office today    Assessment & Plan:  Abscess - s/p I&D today -  continue septra, wound care and refer to gen surg for excision of "shell" and keloid

## 2011-09-05 NOTE — Patient Instructions (Signed)
It was good to see you today. We have performed I&D of your abscess today - wound care as discussed - see below we'll make referral to general surgery to "remove the cyst" to prevent future recurrent infections. Our office will contact you regarding appointment(s) once made. Continue the Septra as we began earlier this week - no additional pills needed at this time Abscess Care After An abscess (also called a boil or furuncle) is an infected area that contains a collection of pus. Signs and symptoms of an abscess include pain, tenderness, redness, or hardness, or you may feel a moveable soft area under your skin. An abscess can occur anywhere in the body. The infection may spread to surrounding tissues causing cellulitis. A cut (incision) by the surgeon was made over your abscess and the pus was drained out. Gauze may have been packed into the space to provide a drain that will allow the cavity to heal from the inside outwards. The boil may be painful for 5 to 7 days. Most people with a boil do not have high fevers. Your abscess, if seen early, may not have localized, and may not have been lanced. If not, another appointment may be required for this if it does not get better on its own or with medications. HOME CARE INSTRUCTIONS    Only take over-the-counter or prescription medicines for pain, discomfort, or fever as directed by your caregiver.   When you bathe, soak and then remove gauze or iodoform packs at least daily or as directed by your caregiver. You may then wash the wound gently with mild soapy water. Repack with gauze or do as your caregiver directs.  SEEK IMMEDIATE MEDICAL CARE IF:    You develop increased pain, swelling, redness, drainage, or bleeding in the wound site.   You develop signs of generalized infection including muscle aches, chills, fever, or a general ill feeling.   An oral temperature above 102 F (38.9 C) develops, not controlled by medication.  See your caregiver for  a recheck if you develop any of the symptoms described above. If medications (antibiotics) were prescribed, take them as directed. Document Released: 10/25/2004 Document Revised: 03/28/2011 Document Reviewed: 06/22/2007 [facility] Patient Information 2012 [place], [place].

## 2011-09-06 ENCOUNTER — Telehealth: Payer: Self-pay

## 2011-09-06 NOTE — Telephone Encounter (Signed)
yes

## 2011-09-06 NOTE — Telephone Encounter (Signed)
Pt is requesting an out of work note for yesterday and today, okay to generate?

## 2011-09-06 NOTE — Telephone Encounter (Signed)
Notified pt excuse note ready for pick=up... 09/06/11@2 :18pm/LMB

## 2011-09-09 ENCOUNTER — Telehealth (INDEPENDENT_AMBULATORY_CARE_PROVIDER_SITE_OTHER): Payer: Self-pay

## 2011-09-09 NOTE — Telephone Encounter (Signed)
I received a referral from Dr Felicity Coyer to get the pt in for an abscess that she lanced in the office.  I offered for the pt to come in Wednesday to see Dr Ezzard Standing.  She stated since it has been lanced she would like to wait until her follow up in August and take care of both issues then.  I told her if it was lanced and all the infection was drained out there would be nothing urgent that Dr Ezzard Standing would do so it is reasonable to wait.  Dr Ezzard Standing would then discuss surgically removing the area at a later date.  She will call if there are any concerns.

## 2011-09-11 ENCOUNTER — Encounter (INDEPENDENT_AMBULATORY_CARE_PROVIDER_SITE_OTHER): Payer: BC Managed Care – PPO | Admitting: Surgery

## 2011-09-17 ENCOUNTER — Encounter: Payer: Self-pay | Admitting: Internal Medicine

## 2011-09-17 ENCOUNTER — Ambulatory Visit (INDEPENDENT_AMBULATORY_CARE_PROVIDER_SITE_OTHER): Payer: BC Managed Care – PPO | Admitting: Internal Medicine

## 2011-09-17 VITALS — BP 152/90 | HR 58 | Temp 98.6°F | Ht 68.0 in | Wt 319.0 lb

## 2011-09-17 DIAGNOSIS — J029 Acute pharyngitis, unspecified: Secondary | ICD-10-CM

## 2011-09-17 DIAGNOSIS — I10 Essential (primary) hypertension: Secondary | ICD-10-CM

## 2011-09-17 DIAGNOSIS — L03319 Cellulitis of trunk, unspecified: Secondary | ICD-10-CM

## 2011-09-17 DIAGNOSIS — L02211 Cutaneous abscess of abdominal wall: Secondary | ICD-10-CM

## 2011-09-17 MED ORDER — HYDROCHLOROTHIAZIDE 25 MG PO TABS: 25.0000 mg | ORAL_TABLET | Freq: Every day | ORAL | Status: DC

## 2011-09-17 MED ORDER — AZITHROMYCIN 250 MG PO TABS: ORAL_TABLET | ORAL | Status: AC

## 2011-09-17 NOTE — Assessment & Plan Note (Signed)
Stress induced? Gn surgery 10/08/11 planned No personal hx same, but strong FH start ARB early 08/2011 - add HCTZ now - we reviewed potential risk/benefit and possible side effects - pt understands and agrees to same  recheck BP in 2-4 weeks and adjust meds as needed BP Readings from Last 3 Encounters:  09/17/11 152/90  09/05/11 152/90  08/29/11 130/82

## 2011-09-17 NOTE — Patient Instructions (Signed)
It was good to see you today. Start HCTZ daily in addition to losartan everyday for high blood pressure - avoid pregnancy while on this medication Also Zpak antibiotics as discussed Your prescription(s) have been submitted to your pharmacy. Please take as directed and contact our office if you believe you are having problem(s) with the medication(s). Other Medications reviewed, no changes at this time.  Please schedule followup in 2-4 weeks for blood pressure and medication check; call sooner if problems.

## 2011-09-17 NOTE — Progress Notes (Signed)
  Subjective:    Patient ID: Debra [name], female    DOB: [birth year], 36 y.o.   MRN: [id number]  HPI  Here for follow up   abd wall abscess - no residual swelling, redness or pain, completed antibiotics - surgical follow up delayed until 11/2011  HTN - started ARB early 08/2011  Also complains of sore throat  - associated with cough (white phlegm)  Past Medical History  Diagnosis Date  . Diabetes mellitus type II 08/2009 dx  . Morbid obesity     s/p lab band 09/2010  . Knee pain, right   . Asthma   . Allergic rhinitis, cause unspecified   . Ovarian cyst     Surgery planned October 08, 2011     Review of Systems  Constitutional: Negative for fever, fatigue and unexpected weight change.  HENT: Positive for sore throat and postnasal drip.   Respiratory: Positive for cough. Negative for shortness of breath.   Cardiovascular: Negative for chest pain and leg swelling.       Objective:   Physical Exam BP 152/90  Pulse 58  Temp(Src) 98.6 F (37 C) (Oral)  Ht 5\' 8"  (1.727 m)  Wt 319 lb (144.697 kg)  BMI 48.50 kg/m2  SpO2 99% Constitutional: She is overweight, appears well-developed and well-nourished. No distress.  HENT: Head: Normocephalic and atraumatic. Ears: B TMs ok, no erythema or effusion; Nose: Nose normal. Mouth/Throat: Oropharynx is red, but clear and moist. No oropharyngeal exudate.  Eyes: Conjunctivae and EOM are normal. Pupils are equal, round, and reactive to light. No scleral icterus.  Neck: Normal range of motion. Neck supple. No JVD or LAD present. No thyromegaly present.  Cardiovascular: Normal rate, regular rhythm and normal heart sounds.  No murmur heard. No BLE edema. Pulmonary/Chest: Effort normal and breath sounds normal. No respiratory distress. She has no wheezes.  Skin: well healed inscsion RUQ at prior abscess - no cellulitis -Skin is warm and dry. No rash noted. No erythema.  Psychiatric: She has a normal mood and affect. Her behavior is normal.  Judgment and thought content normal.        Assessment & Plan:   See problem list. Medications and labs reviewed today.  Acute pharyngitis - add Zpak and continue allergy tx as ongoing  abd wall abscess - resolved - OP follow up gen surg as planned 11/2011 - call sooner if problems

## 2011-10-07 ENCOUNTER — Encounter: Payer: Self-pay | Admitting: Internal Medicine

## 2011-10-07 ENCOUNTER — Ambulatory Visit (INDEPENDENT_AMBULATORY_CARE_PROVIDER_SITE_OTHER): Payer: BC Managed Care – PPO | Admitting: Internal Medicine

## 2011-10-07 VITALS — BP 132/72 | HR 66 | Temp 97.8°F | Ht 68.0 in | Wt 316.6 lb

## 2011-10-07 DIAGNOSIS — I10 Essential (primary) hypertension: Secondary | ICD-10-CM

## 2011-10-07 NOTE — Progress Notes (Signed)
  Subjective:    Patient ID: Debra [name], female    DOB: [birth year], 36 y.o.   MRN: [id number]  HPI   Here for follow up HTN - started ARB early 08/2011, added hctz late 08/2011 = occasional dizzy -    Past Medical History  Diagnosis Date  . Diabetes mellitus type II 08/2009 dx  . Morbid obesity     s/p lab band 09/2010  . Knee pain, right   . Asthma   . Allergic rhinitis, cause unspecified   . Ovarian cyst     Surgery planned October 08, 2011  . Hypertension      Review of Systems  Constitutional: Negative for fever, fatigue and unexpected weight change.  Respiratory: Negative for cough and shortness of breath.   Cardiovascular: Negative for chest pain and leg swelling.       Objective:   Physical Exam  BP 132/72  Pulse 66  Temp 97.8 F (36.6 C) (Oral)  Ht 5\' 8"  (1.727 m)  Wt 316 lb 9.6 oz (143.609 kg)  BMI 48.14 kg/m2  SpO2 99% Constitutional: She is overweight, appears well-developed and well-nourished. No distress.  Neck: Normal range of motion. Neck supple. No JVD or LAD present. No thyromegaly present.  Cardiovascular: Normal rate, regular rhythm and normal heart sounds.  No murmur heard. No BLE edema. Pulmonary/Chest: Effort normal and breath sounds normal. No respiratory distress. She has no wheezes.  Psychiatric: She has a normal mood and affect. Her behavior is normal. Judgment and thought content normal.   Lab Results  Component Value Date   WBC 9.9 08/08/2011   HGB 12.1 08/08/2011   HCT 39.5 08/08/2011   PLT 317 08/08/2011   GLUCOSE 141* 08/08/2011   CHOL 151 07/22/2011   TRIG 58.0 07/22/2011   HDL 49.60 07/22/2011   LDLCALC 90 07/22/2011   ALT 12 08/08/2011   AST 11 08/08/2011   NA 140 08/08/2011   K 3.5 08/08/2011   CL 107 08/08/2011   CREATININE 0.81 08/08/2011   BUN 12 08/08/2011   CO2 21 08/08/2011   TSH 1.24 07/22/2011   HGBA1C 7.0* 07/22/2011      Assessment & Plan:   See problem list. Medications and labs reviewed today.

## 2011-10-07 NOTE — Patient Instructions (Addendum)
It was good to see you today. continue HCTZ daily in addition to losartan everyday for high blood pressure - avoid pregnancy while on this medication - will consider combining into one pill next visit if needed - Please schedule followup in 3 months for blood pressure, sugar, weight and medication check; call sooner if problems.

## 2011-10-07 NOTE — Assessment & Plan Note (Signed)
Stress induced? Gyn surgery 10/08/11 planned No personal hx same, but strong FH started ARB early 08/2011, added HCTZ late 08/2011 The current medical regimen is effective;  continue present plan and medications.    BP Readings from Last 3 Encounters:  10/07/11 132/72  09/17/11 152/90  09/05/11 152/90

## 2011-10-08 HISTORY — PX: SALPINGOOPHORECTOMY: SHX82

## 2011-11-19 ENCOUNTER — Ambulatory Visit: Payer: BC Managed Care – PPO | Admitting: Internal Medicine

## 2011-12-06 ENCOUNTER — Encounter (INDEPENDENT_AMBULATORY_CARE_PROVIDER_SITE_OTHER): Payer: BC Managed Care – PPO | Admitting: Surgery

## 2012-01-07 ENCOUNTER — Other Ambulatory Visit (INDEPENDENT_AMBULATORY_CARE_PROVIDER_SITE_OTHER): Payer: BC Managed Care – PPO

## 2012-01-07 ENCOUNTER — Encounter: Payer: Self-pay | Admitting: Internal Medicine

## 2012-01-07 ENCOUNTER — Ambulatory Visit (INDEPENDENT_AMBULATORY_CARE_PROVIDER_SITE_OTHER): Payer: BC Managed Care – PPO | Admitting: Internal Medicine

## 2012-01-07 VITALS — BP 118/78 | HR 58 | Temp 97.7°F | Resp 16 | Wt 328.0 lb

## 2012-01-07 DIAGNOSIS — I10 Essential (primary) hypertension: Secondary | ICD-10-CM

## 2012-01-07 DIAGNOSIS — E119 Type 2 diabetes mellitus without complications: Secondary | ICD-10-CM

## 2012-01-07 LAB — BASIC METABOLIC PANEL WITH GFR
BUN: 10 mg/dL (ref 6–23)
CO2: 30 meq/L (ref 19–32)
Calcium: 8.5 mg/dL (ref 8.4–10.5)
Chloride: 97 meq/L (ref 96–112)
Creatinine, Ser: 0.8 mg/dL (ref 0.4–1.2)
GFR: 100.5 mL/min
Glucose, Bld: 154 mg/dL — ABNORMAL HIGH (ref 70–99)
Potassium: 3.4 meq/L — ABNORMAL LOW (ref 3.5–5.1)
Sodium: 139 meq/L (ref 135–145)

## 2012-01-07 LAB — HEMOGLOBIN A1C: Hgb A1c MFr Bld: 7.7 % — ABNORMAL HIGH (ref 4.6–6.5)

## 2012-01-07 MED ORDER — LOSARTAN POTASSIUM-HCTZ 100-25 MG PO TABS: 1.0000 | ORAL_TABLET | Freq: Every day | ORAL | Status: DC

## 2012-01-07 NOTE — Assessment & Plan Note (Signed)
strong FH started ARB early 08/2011, added HCTZ late 08/2011 - change to combo now The current medical regimen is effective;  continue present plan and medications.    BP Readings from Last 3 Encounters:  01/07/12 118/78  10/07/11 132/72  09/17/11 152/90

## 2012-01-07 NOTE — Patient Instructions (Signed)
It was good to see you today. Will change losartan and hydrochlorothiazide to single combination pill for blood pressure -Your prescription(s) have been submitted to your pharmacy. Please take as directed and contact our office if you believe you are having problem(s) with the medication(s). Other Medications reviewed, no changes at this time.  Test(s) ordered today. Your results will be called to you after review or Released to My Chart (48-72hours after test completion). If any changes need to be made, you will be notified at that time. Please schedule followup in 6 months for blood pressure and weight check; call sooner if problems.

## 2012-01-07 NOTE — Assessment & Plan Note (Signed)
The current medical regimen is effective;  continue present plan and medications. no medication needs following lap band 09/2010 - diet control; off metformin since 11/2010 check a1c now and titrate and adjust as needed  Lab Results  Component Value Date   HGBA1C 7.0* 07/22/2011

## 2012-01-07 NOTE — Progress Notes (Signed)
  Subjective:    Patient ID: Debra [name], female    DOB: Dec 26, 1975, 36 y.o.   MRN: [id number]  HPI   Here for follow up - reviewed chronic medical issues:  HTN - started ARB early 08/2011, added hctz late 08/2011 = the patient reports compliance with medication(s) as prescribed. Denies adverse side effects.   DM2 - new dx 08/2009 at [facility] - no metformin since 09/2010 (preop) - cbgs 80-100 - working with nutritionist - checks cbgs 2x/day  morbid obesity - s/p lab band 10/02/10 - start weight 340# preop - improving ability to exercise (walk or swim) due to improved pain in R knee-  seasonal allergic rhinitis - reports compliance with ongoing medical treatment and no changes in medication dose or frequency. denies adverse side effects related to current therapy. 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 Diabetes mellitus type II 08/2009 dx  . Morbid obesity     s/p lab band 09/2010  . Knee pain, right   . Asthma   . Allergic rhinitis, cause unspecified   . Ovarian cyst     Surgery planned October 08, 2011  . Hypertension      Review of Systems  Constitutional: Negative for fever, fatigue and unexpected weight change.  Respiratory: Negative for cough and shortness of breath.   Cardiovascular: Negative for chest pain and leg swelling.       Objective:   Physical Exam  BP 118/78  Pulse 58  Temp 97.7 F (36.5 C) (Oral)  Resp 16  Wt 328 lb  (148.78 kg)  SpO2 99% Wt Readings from Last 3 Encounters:  01/07/12 328 lb (148.78 kg)  10/07/11 316 lb 9.6 oz (143.609 kg)  09/17/11 319 lb (144.697 kg)   Constitutional: She is overweight, appears well-developed and well-nourished. No distress.   Cardiovascular: Normal rate, regular rhythm and normal heart sounds.  No murmur heard. No BLE edema. Pulmonary/Chest: Effort normal and breath sounds normal. No respiratory distress. She has no wheezes.  Psychiatric: She has a normal mood and affect. Her behavior is normal. Judgment and thought content normal.   Lab Results  Component Value Date   WBC 9.9 08/08/2011   HGB 12.1 08/08/2011   HCT 39.5 08/08/2011   PLT 317 08/08/2011   GLUCOSE 141* 08/08/2011   CHOL 151 07/22/2011   TRIG 58.0 07/22/2011   HDL 49.60 07/22/2011   LDLCALC 90 07/22/2011   ALT 12 08/08/2011   AST 11 08/08/2011   NA 140 08/08/2011   K 3.5 08/08/2011   CL 107 08/08/2011   CREATININE 0.81 08/08/2011   BUN 12 08/08/2011   CO2 21 08/08/2011   TSH 1.24 07/22/2011   HGBA1C 7.0* 07/22/2011      Assessment & Plan:   See problem list. Medications and labs reviewed today.

## 2012-01-08 MED ORDER — METFORMIN HCL ER 500 MG PO TB24: 500.0000 mg | ORAL_TABLET | Freq: Every day | ORAL | Status: DC

## 2012-01-08 NOTE — Addendum Note (Signed)
Addended by: Rene Paci A on: 01/08/2012 08:18 AM   Modules accepted: Orders

## 2012-02-06 ENCOUNTER — Encounter (INDEPENDENT_AMBULATORY_CARE_PROVIDER_SITE_OTHER): Payer: BC Managed Care – PPO | Admitting: Surgery

## 2012-02-26 ENCOUNTER — Encounter (INDEPENDENT_AMBULATORY_CARE_PROVIDER_SITE_OTHER): Payer: BC Managed Care – PPO | Admitting: Surgery

## 2012-05-12 ENCOUNTER — Telehealth: Payer: Self-pay | Admitting: Internal Medicine

## 2012-05-12 NOTE — Telephone Encounter (Signed)
Patient is requesting a refill on her Albuterol inhaler be sent to [facility] on [address]

## 2012-05-13 MED ORDER — ALBUTEROL SULFATE HFA 108 (90 BASE) MCG/ACT IN AERS: 2.0000 | INHALATION_SPRAY | RESPIRATORY_TRACT | Status: DC | PRN

## 2012-05-13 NOTE — Telephone Encounter (Signed)
Called pt no answer LMOM med sent to [facility].../lmb 

## 2012-05-14 HISTORY — PX: ABDOMINAL HYSTERECTOMY: SHX81

## 2012-06-06 ENCOUNTER — Emergency Department (HOSPITAL_COMMUNITY)
Admission: EM | Admit: 2012-06-06 | Discharge: 2012-06-07 | Disposition: A | Discharge status: Home / Self Care | Payer: BC Managed Care – PPO | Attending: Emergency Medicine | Admitting: Emergency Medicine

## 2012-06-06 ENCOUNTER — Encounter (HOSPITAL_COMMUNITY): Payer: Self-pay | Admitting: Emergency Medicine

## 2012-06-06 DIAGNOSIS — W010XXA Fall on same level from slipping, tripping and stumbling without subsequent striking against object, initial encounter: Secondary | ICD-10-CM | POA: Insufficient documentation

## 2012-06-06 DIAGNOSIS — I10 Essential (primary) hypertension: Secondary | ICD-10-CM | POA: Insufficient documentation

## 2012-06-06 DIAGNOSIS — Y929 Unspecified place or not applicable: Secondary | ICD-10-CM | POA: Insufficient documentation

## 2012-06-06 DIAGNOSIS — J45909 Unspecified asthma, uncomplicated: Secondary | ICD-10-CM | POA: Insufficient documentation

## 2012-06-06 DIAGNOSIS — Z8742 Personal history of other diseases of the female genital tract: Secondary | ICD-10-CM | POA: Insufficient documentation

## 2012-06-06 DIAGNOSIS — W19XXXA Unspecified fall, initial encounter: Secondary | ICD-10-CM

## 2012-06-06 DIAGNOSIS — J309 Allergic rhinitis, unspecified: Secondary | ICD-10-CM | POA: Insufficient documentation

## 2012-06-06 DIAGNOSIS — Z9884 Bariatric surgery status: Secondary | ICD-10-CM | POA: Insufficient documentation

## 2012-06-06 DIAGNOSIS — Y9389 Activity, other specified: Secondary | ICD-10-CM | POA: Insufficient documentation

## 2012-06-06 DIAGNOSIS — Z79899 Other long term (current) drug therapy: Secondary | ICD-10-CM | POA: Insufficient documentation

## 2012-06-06 DIAGNOSIS — IMO0002 Reserved for concepts with insufficient information to code with codable children: Secondary | ICD-10-CM | POA: Insufficient documentation

## 2012-06-06 DIAGNOSIS — S63509A Unspecified sprain of unspecified wrist, initial encounter: Secondary | ICD-10-CM | POA: Insufficient documentation

## 2012-06-06 DIAGNOSIS — S40019A Contusion of unspecified shoulder, initial encounter: Secondary | ICD-10-CM | POA: Insufficient documentation

## 2012-06-06 DIAGNOSIS — E119 Type 2 diabetes mellitus without complications: Secondary | ICD-10-CM | POA: Insufficient documentation

## 2012-06-06 NOTE — ED Notes (Signed)
Bed:WA21<BR> Expected date:<BR> Expected time:<BR> Means of arrival:<BR> Comments:<BR> EMS

## 2012-06-06 NOTE — ED Notes (Signed)
Pt alert, arrives from home, c/o fall, left arm and leg pain, onset was this evening about one hour ago, pt ambulates to triage, resp even unlabored, skin pwd, unknown LOC

## 2012-06-07 ENCOUNTER — Emergency Department (HOSPITAL_COMMUNITY): Payer: BC Managed Care – PPO

## 2012-06-07 MED ORDER — CYCLOBENZAPRINE HCL 5 MG PO TABS: 5.0000 mg | ORAL_TABLET | Freq: Two times a day (BID) | ORAL | Status: DC | PRN

## 2012-06-07 MED ORDER — IBUPROFEN 800 MG PO TABS
800.0000 mg | ORAL_TABLET | Freq: Once | ORAL | Status: AC
Start: 2012-06-07 — End: 2012-06-07
  Administered 2012-06-07: 800 mg via ORAL
  Filled 2012-06-07: qty 1

## 2012-06-07 NOTE — ED Provider Notes (Signed)
History     CSN: [id number]  Arrival date & time 06/06/12  2351   First MD Initiated Contact with Patient 06/07/12 0207      Chief Complaint  Patient presents with  . Fall    (Consider location/radiation/quality/duration/timing/severity/associated sxs/prior treatment) HPI  Pt presents to the ED for fall 1 hour prior to arrival on [place]. She arrives by private vehicle. The pain was immediate after falling onto her right shoulder and right wrist. She was getting out of the car when she fell. She is able to walk without difficulty. She has no hx of prior injury to the area. No hx of gout. She denies hitting her head or injuring her neck. No loc and no vomiting. nad vss   Past Medical History  Diagnosis Date  . Diabetes mellitus type II 08/2009 dx  . Morbid obesity     s/p lab band 09/2010  . Knee pain, right   . Asthma   . Allergic rhinitis, cause unspecified   . Ovarian cyst     right s/p resection June 2013  . Hypertension   . Endometriosis     Past Surgical History  Procedure Laterality Date  . Laparoscopic gastric banding  10/02/2010    start weight 349#  . Salpingoophorectomy  10/08/11    right  . Abdominal hysterectomy      Family History  Problem Relation Age of Onset  . Heart disease Mother   . Hypertension Mother   . Stroke Mother   . Diabetes Father     History  Substance Use Topics  . Smoking status: Never Smoker   . Smokeless tobacco: Not on file  . Alcohol Use: No    OB History   Grav Para Term Preterm Abortions TAB SAB Ect Mult Living                  Review of Systems  Review of Systems  Gen: no weight loss, fevers, chills, night sweats  Eyes: no discharge or drainage, no occular pain or visual changes  Nose: no epistaxis or rhinorrhea  Mouth: no dental pain, no sore throat  Neck: no neck pain  Lungs:No wheezing, coughing or hemoptysis CV: no chest pain, palpitations, dependent edema or orthopnea  Abd: no abdominal pain, nausea,  vomiting  GU: no dysuria or gross hematuria  MSK:  Right shoulder and wrist pain Neuro: no headache, no focal neurologic deficits  Skin: no abnormalities Psyche: negative.    Allergies  Review of patient's allergies indicates no known allergies.  Home Medications   Current Outpatient Rx  Name  Route  Sig  Dispense  Refill  . albuterol (PROAIR HFA) 108 (90 BASE) MCG/ACT inhaler   Inhalation   Inhale 2 puffs into the lungs every 4 (four) hours as needed.   18 g   2   . ibuprofen (ADVIL,MOTRIN) 800 MG tablet   Oral   Take 800 mg by mouth every 8 (eight) hours as needed. For pain         . metFORMIN (GLUCOPHAGE XR) 500 MG 24 hr tablet   Oral   Take 1 tablet (500 mg total) by mouth daily with breakfast.   90 tablet   3   . cyclobenzaprine (FLEXERIL) 5 MG tablet   Oral   Take 1 tablet (5 mg total) by mouth 2 (two) times daily as needed for muscle spasms.   20 tablet   0   . fexofenadine (ALLEGRA) 180 MG tablet  Oral   Take 180 mg by mouth daily as needed. For seasonal allergies         . Fluticasone-Salmeterol (ADVAIR DISKUS) 250-50 MCG/DOSE AEPB   Inhalation   Inhale 1 puff into the lungs 2 (two) times daily.   1 each   3   . losartan-hydrochlorothiazide (HYZAAR) 100-25 MG per tablet   Oral   Take 1 tablet by mouth daily.   90 tablet   3   . mometasone (NASONEX) 50 MCG/ACT nasal spray   Nasal   Place 2 sprays into the nose daily as needed. For nasal spray           BP 150/66  Pulse 68  Temp(Src) 98.8 F (37.1 C)  Resp 18  SpO2 100%  LMP 07/28/2011  Physical Exam  Nursing note and vitals reviewed. Constitutional: She appears well-developed and well-nourished. No distress.  HENT:  Head: Normocephalic and atraumatic.  Eyes: Pupils are equal, round, and reactive to light.  Neck: Normal range of motion. Neck supple.  Cardiovascular: Normal rate and regular rhythm.   Pulmonary/Chest: Effort normal.  Abdominal: Soft.  Musculoskeletal:        Right shoulder: She exhibits tenderness, pain and spasm. She exhibits normal range of motion, no bony tenderness, no swelling, no effusion, no crepitus, no deformity, no laceration, normal pulse and normal strength.       Right wrist: She exhibits decreased range of motion and tenderness. She exhibits no bony tenderness, no swelling, no effusion, no crepitus, no deformity and no laceration.  Neurological: She is alert.  Skin: Skin is warm and dry.    ED Course  Procedures (including critical care time)  Labs Reviewed - No data to display Dg Shoulder Right  06/07/2012  *RADIOLOGY REPORT*  Clinical Data: Fall.  Posterior shoulder pain  RIGHT SHOULDER - 2+ VIEW  Comparison: none  Findings: There is no evidence of fracture or dislocation.  There is no evidence of arthropathy or other focal bone abnormality. Soft tissues are unremarkable.  IMPRESSION: Negative exam.   Original Report Authenticated By: Signa Kell, M.D.    Dg Wrist Complete Right  06/07/2012  *RADIOLOGY REPORT*  Clinical Data: Status post fall; right wrist pain.  RIGHT WRIST - COMPLETE 3+ VIEW  Comparison: None.  Findings: There is no evidence of fracture or dislocation.  The carpal rows are intact, and demonstrate normal alignment.  The joint spaces are preserved.  Negative ulnar variance is noted.  No significant soft tissue abnormalities are seen.  IMPRESSION: No evidence of fracture or dislocation.   Original Report Authenticated By: Tonia Ghent, M.D.      1. Fall   2. Shoulder contusion   3. Wrist sprain       MDM  X-rays are normal. The patient does not need further testing at this time.   She has been given wrist splint and Ibuprofen in ED. She has Percocet and 800 mg ibuprofen at home from her previous surgery which has not yet expired. Will also give her Flexeril.  Referral to ortho given in the case that she continues to have symptoms and needs further evaluation.  Pt has been advised of the symptoms that  warrant their return to the ED. Patient has voiced understanding and has agreed to follow-up with the PCP or specialist.       Dorthula Matas, PA 06/07/12 0401  Dorthula Matas, PA 06/07/12 [phone]

## 2012-06-07 NOTE — ED Provider Notes (Signed)
Medical screening examination/treatment/procedure(s) were performed by non-physician practitioner and as supervising physician I was immediately available for consultation/collaboration.  Halston Fairclough M Lynnita Somma, MD 06/07/12 0431 

## 2012-07-07 ENCOUNTER — Ambulatory Visit: Payer: BC Managed Care – PPO | Admitting: Internal Medicine

## 2012-07-14 ENCOUNTER — Ambulatory Visit (INDEPENDENT_AMBULATORY_CARE_PROVIDER_SITE_OTHER): Payer: BC Managed Care – PPO | Admitting: Internal Medicine

## 2012-07-14 ENCOUNTER — Encounter: Payer: Self-pay | Admitting: Internal Medicine

## 2012-07-14 ENCOUNTER — Other Ambulatory Visit (INDEPENDENT_AMBULATORY_CARE_PROVIDER_SITE_OTHER): Payer: BC Managed Care – PPO

## 2012-07-14 VITALS — BP 110/74 | HR 67 | Temp 98.0°F | Wt 331.0 lb

## 2012-07-14 DIAGNOSIS — E119 Type 2 diabetes mellitus without complications: Secondary | ICD-10-CM

## 2012-07-14 DIAGNOSIS — I10 Essential (primary) hypertension: Secondary | ICD-10-CM

## 2012-07-14 MED ORDER — SITAGLIPTIN PHOSPHATE 100 MG PO TABS: 100.0000 mg | ORAL_TABLET | Freq: Every day | ORAL | Status: DC

## 2012-07-14 NOTE — Assessment & Plan Note (Signed)
strong FH started ARB early 08/2011, added HCTZ late 08/2011 - changed to combo 12/2011 The current medical regimen is effective;  continue present plan and medications.    BP Readings from Last 3 Encounters:  07/14/12 110/74  06/07/12 150/66  01/07/12 118/78

## 2012-07-14 NOTE — Assessment & Plan Note (Signed)
The current medical regimen is effective;  continue present plan and medications. no medication needs following lap band 09/2010 - diet control and off metformin since 11/2010 Metformin resumed 12/2011 due to a1c>7, but not taking same check a1c now and change class of meds if still >7  Lab Results  Component Value Date   HGBA1C 7.7* 01/07/2012

## 2012-07-14 NOTE — Progress Notes (Signed)
  Subjective:    Patient ID: Debra [name], female    DOB: 03-21-1976, 37 y.o.   MRN: [id number]  HPI  Here for follow up - reviewed chronic medical issues:  HTN - started ARB early 08/2011, added hctz late 08/2011 = the patient reports compliance with medication(s) as prescribed. Denies adverse side effects.   DM2 - new dx 08/2009 at [facility] -stopped metformin 09/2010 (pre lab band) and intially diet controlled postsurgical - working with nutritionist; however, weight gain trends reviewed and resumed metformin September 2013. Reports frequent noncompliance due to medication side effects - checks cbgs 1-2x/day  morbid obesity - s/p lab band 10/02/10 - start weight 340# preop -  seasonal allergic rhinitis - reports compliance with ongoing medical treatment and no changes in medication dose or frequency. denies adverse side effects related to current therapy. 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 Diabetes mellitus type II 08/2009 dx  . Morbid obesity     s/p lab band 09/2010  . Knee pain, right   . Asthma   . Allergic rhinitis, cause unspecified   . Ovarian cyst     right s/p resection June 2013  . Hypertension   . Endometriosis      Review of Systems  Constitutional: Negative for fever, fatigue and unexpected weight change.  Respiratory: Negative for cough and shortness of breath.   Cardiovascular: Negative for chest pain and leg swelling.      Objective:   Physical Exam  BP 110/74  Pulse 67  Temp(Src) 98 F (36.7 C) (Oral)  Wt 331 lb (150.141 kg)  BMI 50.34 kg/m2  SpO2 98%  LMP 07/28/2011 Wt Readings from Last 3 Encounters:  07/14/12 331 lb (150.141 kg)  01/07/12 328 lb (148.78 kg)  10/07/11 316 lb 9.6 oz (143.609 kg)   Constitutional: She is overweight, appears well-developed and well-nourished. No distress.   Cardiovascular: Normal rate, regular rhythm and normal heart sounds.  No murmur heard. No BLE edema. Pulmonary/Chest: Effort normal and breath sounds normal. No respiratory distress. She has no wheezes.  Psychiatric: She has a normal mood and affect. Her behavior is normal. Judgment and thought content normal.   Lab Results  Component Value Date   WBC 9.9 08/08/2011   HGB 12.1 08/08/2011   HCT 39.5 08/08/2011   PLT 317 08/08/2011   GLUCOSE 154* 01/07/2012   CHOL 151 07/22/2011   TRIG 58.0 07/22/2011   HDL 49.60 07/22/2011   LDLCALC 90 07/22/2011   ALT 12 08/08/2011   AST 11 08/08/2011   NA 139 01/07/2012   K 3.4* 01/07/2012   CL 97 01/07/2012   CREATININE 0.8 01/07/2012   BUN 10 01/07/2012   CO2 30 01/07/2012   TSH 1.24 07/22/2011   HGBA1C 7.7* 01/07/2012      Assessment & Plan:   See problem list. Medications and labs reviewed today.

## 2012-07-14 NOTE — Patient Instructions (Signed)
It was good to see you today. Stop metformin Start Januvia once daily for diabetes control Your prescription(s) have been submitted to your pharmacy. Please take as directed and contact our office if you believe you are having problem(s) with the medication(s). Other Medications reviewed, no changes at this time.  Test(s) ordered today. Your results will be called to you after review or Released to My Chart (48-72hours after test completion). If any changes need to be made, you will be notified at that time. Please schedule followup in 3 months for diabetes mellitus, blood pressure and weight check; call sooner if problems. Work on lifestyle changes as discussed (low fat, low carb, increased protein diet; improved exercise efforts; weight loss) to control sugar, blood pressure and cholesterol levels and/or reduce risk of developing other medical problems. Look into [email or web address]cy or other type of food journal to assist you in this process. Exercise to Lose Weight Exercise and a healthy diet may help you lose weight. Your doctor may suggest specific exercises. EXERCISE IDEAS AND TIPS  Choose low-cost things you enjoy doing, such as walking, bicycling, or exercising to workout videos.  Take stairs instead of the elevator.  Walk during your lunch break.  Park your car further away from work or school.  Go to a gym or an exercise class.  Start with 5 to 10 minutes of exercise each day. Build up to 30 minutes of exercise 4 to 6 days a week.  Wear shoes with good support and comfortable clothes.  Stretch before and after working out.  Work out until you breathe harder and your heart beats faster.  Drink extra water when you exercise.  Do not do so much that you hurt yourself, feel dizzy, or get very short of breath. Exercises that burn about 150 calories:  Running 1  miles in 15 minutes.  Playing volleyball for 45 to 60 minutes.  Washing and waxing a car for 45 to 60  minutes.  Playing touch football for 45 minutes.  Walking 1  miles in 35 minutes.  Pushing a stroller 1  miles in 30 minutes.  Playing basketball for 30 minutes.  Raking leaves for 30 minutes.  Bicycling 5 miles in 30 minutes.  Walking 2 miles in 30 minutes.  Dancing for 30 minutes.  Shoveling snow for 15 minutes.  Swimming laps for 20 minutes.  Walking up stairs for 15 minutes.  Bicycling 4 miles in 15 minutes.  Gardening for 30 to 45 minutes.  Jumping rope for 15 minutes.  Washing windows or floors for 45 to 60 minutes. Document Released: 05/11/2010 Document Revised: 07/01/2011 Document Reviewed: 05/11/2010 [facility] Patient Information 2013 [place], [place].

## 2012-07-14 NOTE — Assessment & Plan Note (Signed)
S/p Lap band 10/02/10 - start weight 349#, reverse trend reviewed encouragement provided  Wt Readings from Last 3 Encounters:  07/14/12 331 lb (150.141 kg)  01/07/12 328 lb (148.78 kg)  10/07/11 316 lb 9.6 oz (143.609 kg)

## 2012-08-08 ENCOUNTER — Ambulatory Visit (INDEPENDENT_AMBULATORY_CARE_PROVIDER_SITE_OTHER): Payer: BC Managed Care – PPO | Admitting: Emergency Medicine

## 2012-08-08 VITALS — BP 122/80 | HR 106 | Temp 98.0°F | Resp 18 | Ht 66.5 in | Wt 329.0 lb

## 2012-08-08 DIAGNOSIS — R52 Pain, unspecified: Secondary | ICD-10-CM

## 2012-08-08 DIAGNOSIS — J029 Acute pharyngitis, unspecified: Secondary | ICD-10-CM

## 2012-08-08 DIAGNOSIS — J309 Allergic rhinitis, unspecified: Secondary | ICD-10-CM

## 2012-08-08 LAB — POCT INFLUENZA A/B: Influenza A, POC: NEGATIVE

## 2012-08-08 LAB — POCT RAPID STREP A (OFFICE): Rapid Strep A Screen: NEGATIVE

## 2012-08-08 MED ORDER — PREDNISONE 10 MG PO TABS: ORAL_TABLET | ORAL | Status: DC

## 2012-08-08 MED ORDER — MONTELUKAST SODIUM 10 MG PO TABS: 10.0000 mg | ORAL_TABLET | Freq: Every day | ORAL | Status: DC

## 2012-08-08 MED ORDER — OLOPATADINE HCL 0.1 % OP SOLN: 1.0000 [drp] | Freq: Two times a day (BID) | OPHTHALMIC | Status: DC

## 2012-08-08 NOTE — Progress Notes (Signed)
  Subjective:    Patient ID: Debra [name], female    DOB: 1975/10/26, 37 y.o.   MRN: [id number]  HPI patient has a long history of allergies. This week she has had significant head congestion itchy watery eye clear rhinorrhea associated with a sore throat and a dry cough. She's currently on Allegra Nasonex Advair and has a Provera inhaler .    Review of Systems     Objective:   Physical Exam patient is alert and cooperative she has some hyperpigmentation around the upper and lower lids appear conjunctiva are injected TMs are clear. The nose shows clear rhinorrhea the throat is slightly red neck is supple. Chest was clear to both auscultation and percussion.  Results for orders placed in visit on 08/08/12  POCT RAPID STREP A (OFFICE)      Result Value Range   Rapid Strep A Screen Negative  Negative  POCT INFLUENZA A/B      Result Value Range   Influenza A, POC Negative     Influenza B, POC Negative    GLUCOSE, POCT (MANUAL RESULT ENTRY)      Result Value Range   POC Glucose 197 (*) 70 - 99 mg/dl        Assessment & Plan:  Will treat patient with a taper of prednisone. She was also given Singulair to take at night and Patanol eyedrops. Sugars 197. She will need to keep a close control on her diabetes and I be sure she takes medications for this and monitor sugar.

## 2012-08-08 NOTE — Patient Instructions (Addendum)
Allergic Rhinitis  Allergic rhinitis is when the mucous membranes in the nose respond to allergens. Allergens are particles in the air that cause your body to have an allergic reaction. This causes you to release allergic antibodies. Through a chain of events, these eventually cause you to release histamine into the blood stream (hence the use of antihistamines). Although meant to be protective to the body, it is this release that causes your discomfort, such as frequent sneezing, congestion and an itchy runny nose.   CAUSES   The pollen allergens may come from grasses, trees, and weeds. This is seasonal allergic rhinitis, or "hay fever." Other allergens cause year-round allergic rhinitis (perennial allergic rhinitis) such as house dust mite allergen, pet dander and mold spores.   SYMPTOMS   · Nasal stuffiness (congestion).  · Runny, itchy nose with sneezing and tearing of the eyes.  · There is often an itching of the mouth, eyes and ears.  It cannot be cured, but it can be controlled with medications.  DIAGNOSIS   If you are unable to determine the offending allergen, skin or blood testing may find it.  TREATMENT   · Avoid the allergen.  · Medications and allergy shots (immunotherapy) can help.  · Hay fever may often be treated with antihistamines in pill or nasal spray forms. Antihistamines block the effects of histamine. There are over-the-counter medicines that may help with nasal congestion and swelling around the eyes. Check with your caregiver before taking or giving this medicine.  If the treatment above does not work, there are many new medications your caregiver can prescribe. Stronger medications may be used if initial measures are ineffective. Desensitizing injections can be used if medications and avoidance fails. Desensitization is when a patient is given ongoing shots until the body becomes less sensitive to the allergen. Make sure you follow up with your caregiver if problems continue.  SEEK MEDICAL  CARE IF:   · You develop fever (more than 100.5° F (38.1° C).  · You develop a cough that does not stop easily (persistent).  · You have shortness of breath.  · You start wheezing.  · Symptoms interfere with normal daily activities.  Document Released: 01/01/2001 Document Revised: 07/01/2011 Document Reviewed: 07/13/2008  ExitCare® Patient Information ©2013 [employer].    Asthma, Adult  Asthma is caused by narrowing of the air passages in the lungs. It may be triggered by pollen, dust, animal dander, molds, some foods, respiratory infections, exposure to smoke, exercise, emotional stress or other allergens (things that cause allergic reactions or allergies). Repeat attacks are common.  HOME CARE INSTRUCTIONS   · Use prescription medications as ordered by your caregiver.  · Avoid pollen, dust, animal dander, molds, smoke and other things that cause attacks at home and at work.  · You may have fewer attacks if you decrease dust in your home. Electrostatic air cleaners may help.  · It may help to replace your pillows or mattress with materials less likely to cause allergies.  · Talk to your caregiver about an action plan for managing asthma attacks at home, including, the use of a peak flow meter which measures the severity of your asthma attack. An action plan can help minimize or stop the attack without having to seek medical care.  · If you are not on a fluid restriction, drink 8 to 10 glasses of water each day.  · Always have a plan prepared for seeking medical attention, including, calling your physician, accessing local emergency   care, and calling 911 (in the [place] for a severe attack.  · Discuss possible exercise routines with your caregiver.  · If animal dander is the cause of asthma, you may need to get rid of pets.  SEEK MEDICAL CARE IF:   · You have wheezing and shortness of breath even if taking medicine to prevent attacks.  · You have muscle aches, chest pain or thickening of sputum.  · Your sputum  changes from clear or white to yellow, green, gray, or bloody.  · You have any problems that may be related to the medicine you are taking (such as a rash, itching, swelling or trouble breathing).  SEEK IMMEDIATE MEDICAL CARE IF:   · Your usual medicines do not stop your wheezing or there is increased coughing and/or shortness of breath.  · You have increased difficulty breathing.  · You have a fever.  MAKE SURE YOU:   · Understand these instructions.  · Will watch your condition.  · Will get help right away if you are not doing well or get worse.  Document Released: 04/08/2005 Document Revised: 07/01/2011 Document Reviewed: 11/25/2007  ExitCare® Patient Information ©2013 [employer].

## 2012-08-12 ENCOUNTER — Telehealth: Payer: Self-pay

## 2012-08-12 NOTE — Telephone Encounter (Signed)
Patient was here on Saturday for your allergies.  She needs a note for work stating she needs to be inside as much as possible.  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  Please call her.

## 2012-08-13 ENCOUNTER — Telehealth: Payer: Self-pay | Admitting: Internal Medicine

## 2012-08-13 NOTE — Telephone Encounter (Signed)
Please decide.

## 2012-08-13 NOTE — Telephone Encounter (Signed)
Left message for pt to call back  °

## 2012-08-13 NOTE — Telephone Encounter (Signed)
Please call the patient and let her know she does need to stay inside for the next 2 weeks while her  allergies are bad. It is okay to give her a note for this.

## 2012-08-13 NOTE — Telephone Encounter (Signed)
Patient Information:  Caller Name: Deaisa  Phone: [phone]  Patient: Debra [name], Debra [name]  Gender: Female  DOB: [birth year]  Age: 37 Years  PCP: Rene Paci (Adults only)  Pregnant: No  Office Follow Up:  Does the office need to follow up with this patient?: No  Instructions For The Office: N/A   Symptoms  Reason For Call & Symptoms: Pt states she was seen in the Urgent Care on Sat 419/14,  Allergies are worse.  Reviewed Health History In EMR: Yes  Reviewed Medications In EMR: Yes  Reviewed Allergies In EMR: Yes  Reviewed Surgeries / Procedures: Yes  Date of Onset of Symptoms: 08/05/2012 OB / GYN:  LMP: Unknown  Guideline(s) Used:  Hay Fever - Nasal Allergies  Disposition Per Guideline:   See Today or Tomorrow in Office  Reason For Disposition Reached:   Moderate-Severe nasal allergy symptoms (i.e., interfere with sleep, school, or work) and taking antihistamines > 2 days  Advice Given:  Wash off Pollen Daily:  Remove pollen from the body with hair washing and a shower, especially before bedtime.  Wash off Pollen Daily:  Remove pollen from the body with hair washing and a shower, especially before bedtime.  Avoiding Pollen:  Stay indoors on windy days  Keep windows closed in home, at least in bedroom; use air conditioner  For a Stuffy Nose - Use Nasal Washes:  Introduction: Saline (salt water) nasal irrigation (nasal washes) is an effective and simple home remedy for treating stuffy nose and sinus congestion. The nose can be irrigated by pouring, spraying, or squirting salt water into the nose and then letting it run back out.  For Eye Allergies:   For eye symptoms, wash pollen off the face and eyelids. Then apply cold wet compresses. Oral antihistamines will usually bring all eye symptoms under control.  Call Back If:  You become worse  Patient Will Follow Care Advice:  YES  Appointment Scheduled:  08/14/2012 08:45:00 Appointment Scheduled Provider:  Rene Paci (Adults only)

## 2012-08-14 ENCOUNTER — Encounter: Payer: Self-pay | Admitting: *Deleted

## 2012-08-14 ENCOUNTER — Ambulatory Visit (INDEPENDENT_AMBULATORY_CARE_PROVIDER_SITE_OTHER): Payer: BC Managed Care – PPO | Admitting: Internal Medicine

## 2012-08-14 ENCOUNTER — Encounter: Payer: Self-pay | Admitting: Internal Medicine

## 2012-08-14 VITALS — BP 110/72 | HR 68 | Temp 97.8°F | Wt 327.4 lb

## 2012-08-14 DIAGNOSIS — E119 Type 2 diabetes mellitus without complications: Secondary | ICD-10-CM

## 2012-08-14 DIAGNOSIS — J45909 Unspecified asthma, uncomplicated: Secondary | ICD-10-CM

## 2012-08-14 DIAGNOSIS — J309 Allergic rhinitis, unspecified: Secondary | ICD-10-CM

## 2012-08-14 MED ORDER — METHYLPREDNISOLONE ACETATE 80 MG/ML IJ SUSP
80.0000 mg | Freq: Once | INTRAMUSCULAR | Status: AC
  Administered 2012-08-14: 80 mg via INTRAMUSCULAR

## 2012-08-14 MED ORDER — BENZONATATE 100 MG PO CAPS: 100.0000 mg | ORAL_CAPSULE | Freq: Three times a day (TID) | ORAL | Status: DC | PRN

## 2012-08-14 NOTE — Assessment & Plan Note (Signed)
Severe seasonal symptoms - ongoing flare - IM Medrol 80 mg today Will arrange follow up with allergy (young) for same - positive allergy testing 07/2010 Continue antihist, singular and nasal steroid as ongoing - also asthma tx as below Work note yesterday thru weekend provided

## 2012-08-14 NOTE — Assessment & Plan Note (Signed)
Recurrent flares and spring - evidence for mild acute bronchospasm but no infection today changed Dulera to steroid only inhaler 12/12 due to tremor side effects -but poorly controlled symptoms spring 2013 Changed to advair 07/2011 - continue same with prn Alb

## 2012-08-14 NOTE — Assessment & Plan Note (Signed)
The current medical regimen is effective;  continue present plan and medications. no medication needs following lap band 09/2010 - diet control and off metformin since 11/2010 Metformin resumed 12/2011 due to a1c>7 - changed to Januvia 06/2012 patient advised on anticipated increase in cbgs due to steroid effect for next several days - pt will call if cbg>200  Lab Results  Component Value Date   HGBA1C 9.3* 07/14/2012

## 2012-08-14 NOTE — Patient Instructions (Signed)
It was good to see you today. We have reviewed your prior records including labs and tests today Medications reviewed and updated, no changes recommended at this time. Medrol shot given today for severe allergy and asthma symptoms Work excuse note as discussed, yesterday through the weekend, okay to return Monday Will refer to Dr. Maple Hudson for followup on allergies and consideration of allergy shots or other therapy Watch your sugars and call if over 200 in next few days - steroids will increase your sugars more than usual for next few days! Keep followup as scheduled for diabetes check this summer, call sooner if problems

## 2012-08-14 NOTE — Progress Notes (Signed)
  Subjective:    Patient ID: Debra [name], female    DOB: 1975-05-29, 37 y.o.   MRN: [id number]  HPI  Here for severe allergies Urgent care visit for same last week reviewed -remains on prednisone in addition to chronic allergy and asthma therapy Continue dry cough, sneezing, congestion and malaise  Past Medical History  Diagnosis Date  . Diabetes mellitus type II 08/2009 dx  . Morbid obesity     s/p lab band 09/2010  . Knee pain, right   . Asthma   . Allergic rhinitis, cause unspecified   . Ovarian cyst     right s/p resection June 2013  . Hypertension   . Endometriosis     Review of Systems  Constitutional: Positive for fatigue. Negative for fever and unexpected weight change.  HENT: Positive for congestion, rhinorrhea, sneezing, postnasal drip and sinus pressure.   Respiratory: Positive for cough, shortness of breath and wheezing. Negative for choking and chest tightness.   Cardiovascular: Negative for chest pain and leg swelling.  Neurological: Negative for dizziness and headaches.       Objective:   Physical Exam BP 110/72  Pulse 68  Temp(Src) 97.8 F (36.6 C) (Oral)  Wt 327 lb 6.4 oz (148.508 kg)  BMI 52.06 kg/m2  SpO2 98%  LMP 07/28/2011 Wt Readings from Last 3 Encounters:  08/14/12 327 lb 6.4 oz (148.508 kg)  08/08/12 329 lb (149.233 kg)  07/14/12 331 lb (150.141 kg)   Constitutional: She appears well-developed and well-nourished. Nontoxic but dry coughing with conversation.  HENT: Head: Normocephalic and atraumatic. Sinus nontender Ears: B TMs ok, no erythema or effusion; Nose: rhinorrhea and mucosal pallor. Mouth/Throat: Oropharynx is clear and moist. Clear PND No oropharyngeal exudate.  Eyes: wearing glasses; Conjunctivae red and watery consistent with allergic conjunctivitis; EOM are normal. Pupils are equal, round, and reactive to light. No scleral icterus.  Neck: Normal range of motion. Neck supple. No JVD or LAD present. No thyromegaly present.   Cardiovascular: Normal rate, regular rhythm and normal heart sounds.  No murmur heard. No BLE edema. Pulmonary/Chest: Effort normal and breath sounds diminished at bases. Dry cough with conversation but no respiratory distress. She has few soft end exp wheezes. Skin: Skin is warm and dry. No rash noted. No erythema.  Psychiatric: She has a normal mood and affect. Her behavior is normal. Judgment and thought content normal.   Lab Results  Component Value Date   WBC 9.9 08/08/2011   HGB 12.1 08/08/2011   HCT 39.5 08/08/2011   PLT 317 08/08/2011   GLUCOSE 154* 01/07/2012   CHOL 151 07/22/2011   TRIG 58.0 07/22/2011   HDL 49.60 07/22/2011   LDLCALC 90 07/22/2011   ALT 12 08/08/2011   AST 11 08/08/2011   NA 139 01/07/2012   K 3.4* 01/07/2012   CL 97 01/07/2012   CREATININE 0.8 01/07/2012   BUN 10 01/07/2012   CO2 30 01/07/2012   TSH 1.24 07/22/2011   HGBA1C 9.3* 07/14/2012        Assessment & Plan:   See problem list. Medications and labs reviewed today.

## 2012-08-18 NOTE — Telephone Encounter (Signed)
Pt notified note will ready for pickup

## 2012-08-20 ENCOUNTER — Telehealth: Payer: Self-pay | Admitting: Internal Medicine

## 2012-08-20 ENCOUNTER — Telehealth: Payer: Self-pay

## 2012-08-20 NOTE — Telephone Encounter (Signed)
Phone call from pt to triage requesting to be seen from earlier message regarding blurred vision. She states this was not happening until she started taking the allergy medication. She believes when she checked her blood sugar about 3 days ago it was maybe 180. I transferred her to the front desk to get an appt for tomorrow.

## 2012-08-20 NOTE — Telephone Encounter (Signed)
Patient Information:  Caller Name: Debra [name]  Phone: [phone]  Patient: Debra [name], Debra [name]  Gender: Female  DOB: 1975-07-11  Age: 37 Years  PCP: Rene Paci (Adults only)  Pregnant: No  Office Follow Up:  Does the office need to follow up with this patient?: Yes  Instructions For The Office: Pt needs to be seen today.  Has several diagnosis that could be causing her blurred vision.  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  She is also out of glucometer strips.    Symptoms  Reason For Call & Symptoms: Pr calling that she is allergic to polen.  She was seen at Urgent Care 08/08/12 and got  some med and then she saw Dr. Felicity Coyer last week.  Pt is feeling a little bit better.   She is now having blurred vision and these SX started 08/14/12 and she was not able to go to work.  She feels exhausted.  She is out of BG testing strips and does not have a RX for these.  Reviewed Health History In EMR: Yes  Reviewed Medications In EMR: Yes  Reviewed Allergies In EMR: Yes  Reviewed Surgeries / Procedures: Yes  Date of Onset of Symptoms: 08/08/2012  Treatments Tried: Allergy meds, Medrol dose pack  Treatments Tried Worked: No OB / GYN:  LMP: Unknown  Guideline(s) Used:  Weakness (Generalized) and Fatigue  Disposition Per Guideline:   Go to Office Now  Reason For Disposition Reached:   Moderate weakness (i.e., interferes with work, school, normal activities) and cause unknown  Advice Given:  No driving or any activity that needs acute vision. Voices understanding.  Call back instructions given.   Patient Will Follow Care Advice:  YES

## 2012-08-20 NOTE — Telephone Encounter (Signed)
Noted and agree thanks

## 2012-08-21 ENCOUNTER — Encounter: Payer: Self-pay | Admitting: *Deleted

## 2012-08-21 ENCOUNTER — Encounter: Payer: Self-pay | Admitting: Internal Medicine

## 2012-08-21 ENCOUNTER — Ambulatory Visit (INDEPENDENT_AMBULATORY_CARE_PROVIDER_SITE_OTHER): Payer: BC Managed Care – PPO | Admitting: Internal Medicine

## 2012-08-21 VITALS — BP 130/82 | HR 79 | Temp 98.3°F

## 2012-08-21 DIAGNOSIS — H538 Other visual disturbances: Secondary | ICD-10-CM

## 2012-08-21 DIAGNOSIS — J309 Allergic rhinitis, unspecified: Secondary | ICD-10-CM

## 2012-08-21 DIAGNOSIS — J45909 Unspecified asthma, uncomplicated: Secondary | ICD-10-CM

## 2012-08-21 MED ORDER — GLUCOSE BLOOD VI STRP: ORAL_STRIP | Status: DC

## 2012-08-21 MED ORDER — GLIPIZIDE 5 MG PO TABS: 5.0000 mg | ORAL_TABLET | Freq: Two times a day (BID) | ORAL | Status: DC

## 2012-08-21 NOTE — Assessment & Plan Note (Signed)
no medication needs following lap band 09/2010 - diet control and off metformin since 11/2010 Metformin resumed 12/2011 due to a1c>7 - stoped due to GI side effects  started Januvia 06/2012 Acute exacerbation due to steroids for asthma/allergies late 07/2012 Add glipizide 5 BID AC now -  pt will call if cbg>250 or <100  Lab Results  Component Value Date   HGBA1C 9.3* 07/14/2012

## 2012-08-21 NOTE — Patient Instructions (Signed)
It was good to see you today. Add glipizide 5mg  2x/day with meals for diabetes mellitus control in addition to ongoing Januvia daily  Refill on strips for check sugar as discussed - call if sugar over 250 or less than 100 Your prescription(s) have been submitted to your pharmacy. Please take as directed and contact our office if you believe you are having problem(s) with the medication(s). Hydrate aggressively for next 72h, then as needed Call if symptoms unimproved, sooner if worse

## 2012-08-21 NOTE — Progress Notes (Signed)
  Subjective:    Patient ID: Debra [name], female    DOB: 03-01-1976, 37 y.o.   MRN: [id number]  HPI  Pt. Presents to the office for blurred vision.  Began last Thursday.  Was driving and noticed that the signs aren't as clear.  She also feels like she has stood up to fast and this is a constant feeling.  This feeling of "dizziness" is lessened with laying down.  She has stopped taking all of her medicines with the exception of her blood sugar medicine.  Nothing makes the bluriness worse.      Patient is also a diabetic and took her cbg this morning which was 289 without food.  She has not taken her cbg in a week because she was out of strips.  She feels like she has been much thirstier than usual and has been urinating "a lot".     Review of Systems  Constitutional: Positive for fatigue. Negative for fever and chills.  Respiratory: Positive for cough.   Cardiovascular: Negative for chest pain and palpitations.  Gastrointestinal: Negative for nausea, vomiting, diarrhea and constipation.  Endocrine: Positive for polydipsia and polyuria.  Neurological: Positive for dizziness, weakness and light-headedness. Negative for numbness and headaches.  All other systems reviewed and are negative.       Objective:   Physical Exam  Nursing note and vitals reviewed. Constitutional: She is oriented to person, place, and time. She appears well-developed and well-nourished.  HENT:  Head: Normocephalic and atraumatic.  Right Ear: External ear normal.  Left Ear: External ear normal.  Eyes: Conjunctivae and EOM are normal. Pupils are equal, round, and reactive to light. No scleral icterus.  Neck: Normal range of motion. Neck supple.  Cardiovascular: Normal rate, regular rhythm and normal heart sounds.  Exam reveals no gallop and no friction rub.   No murmur heard. Pulmonary/Chest: Effort normal and breath sounds normal. No respiratory distress. She has no wheezes. She has no rales. She exhibits no  tenderness.  Musculoskeletal: Normal range of motion.  Lymphadenopathy:    She has no cervical adenopathy.  Neurological: She is alert and oriented to person, place, and time. No cranial nerve deficit.  Skin: Skin is warm and dry. She is not diaphoretic.  Psychiatric: She has a normal mood and affect. Her behavior is normal.          Assessment & Plan:  Patient here today for bluriness, and dizziness.  Patient reported cbg at home of 289, increased polyuria, and polydipsia likely point to hyperglycemia and dehydration as cause of dizziness and bluriness.  Hyperglycemia may be caused by steroid injection 08/14/12 and oral steroids for treatment of allergies and asthma.    Patient prescribed glipizide 5mg  PO BID until sugars are down below 150.  If sugars remain below 150 she can reduce dose of glipizide to QD.  Continue Januvia as prescribed.  Patient to drink lots of water to remain hydrated.  Patient referral to opthamology and counseled on the importance of yearly diabetic eye exams  Patient to check cbgs daily and to call if blood sugars remain high.    I have personally reviewed this case with PA student. I also personally examined this patient. I agree with history and findings as documented above. I reviewed, discussed and approve of the assessment and plan as listed above. Rene Paci, MD

## 2012-08-21 NOTE — Progress Notes (Signed)
  Subjective:    Patient ID: Debra [name], female    DOB: [birth year], 37 y.o.   MRN: [id number]  HPI  see CC Also see PA student note and CAN note  Past Medical History  Diagnosis Date  . Diabetes mellitus type II 08/2009 dx  . Morbid obesity     s/p lab band 09/2010  . Knee pain, right   . Asthma   . Allergic rhinitis, cause unspecified   . Ovarian cyst     right s/p resection June 2013  . Hypertension   . Endometriosis     Review of Systems  Constitutional: Positive for fatigue. Negative for fever, appetite change and unexpected weight change.  Eyes: Positive for visual disturbance. Negative for photophobia, pain, discharge and redness.  Cardiovascular: Negative for chest pain and leg swelling.  Gastrointestinal: Negative for nausea, vomiting, abdominal pain, diarrhea and constipation.  Endocrine: Positive for polydipsia and polyuria.  Genitourinary: Positive for urgency and frequency. Negative for flank pain, decreased urine volume, enuresis and dyspareunia.  Allergic/Immunologic: Positive for environmental allergies. Negative for food allergies and immunocompromised state.  Neurological: Positive for dizziness. Negative for seizures, facial asymmetry, speech difficulty, weakness, numbness and headaches.       Objective:   Physical Exam BP 130/82  Pulse 79  Temp(Src) 98.3 F (36.8 C) (Oral)  SpO2 98%  LMP 07/28/2011 Wt Readings from Last 3 Encounters:  08/14/12 327 lb 6.4 oz (148.508 kg)  08/08/12 329 lb (149.233 kg)  07/14/12 331 lb (150.141 kg)   Constitutional: She is obese, but appears well-developed and well-nourished. No distress.  HENT: Head: Normocephalic and atraumatic. Ears: B TMs ok, no erythema or effusion; Nose: Nose normal. Mouth/Throat: Oropharynx is clear and moist. No oropharyngeal exudate.  Eyes: Conjunctivae and EOM are normal. Pupils are equal, round, and reactive to light. No scleral icterus. Vision intact reading a magazine without correction at  arm length distance Neck: Normal range of motion. Neck supple. No JVD present. No thyromegaly present.  Cardiovascular: Normal rate, regular rhythm and normal heart sounds.  No murmur heard. No BLE edema. Pulmonary/Chest: Effort normal and breath sounds normal. No respiratory distress. She has no wheezes. Neurological: She is alert and oriented to person, place, and time. No cranial nerve deficit. Coordination, balance, strength, speech and gait are normal.  Psychiatric: She has a normal mood and affect. Her behavior is normal. Judgment and thought content normal.   Lab Results  Component Value Date   WBC 9.9 08/08/2011   HGB 12.1 08/08/2011   HCT 39.5 08/08/2011   PLT 317 08/08/2011   GLUCOSE 154* 01/07/2012   CHOL 151 07/22/2011   TRIG 58.0 07/22/2011   HDL 49.60 07/22/2011   LDLCALC 90 07/22/2011   ALT 12 08/08/2011   AST 11 08/08/2011   NA 139 01/07/2012   K 3.4* 01/07/2012   CL 97 01/07/2012   CREATININE 0.8 01/07/2012   BUN 10 01/07/2012   CO2 30 01/07/2012   TSH 1.24 07/22/2011   HGBA1C 9.3* 07/14/2012        Assessment & Plan:   See problem list. Medications and labs reviewed today.

## 2012-08-22 NOTE — Assessment & Plan Note (Signed)
Severe seasonal symptoms - s/p tx flare last week with IM Medrol 80 mg -  Improved symptoms (but symptomatic hyperglycemia - see above) pending follow up with allergy (young) for same - positive allergy testing 07/2010 Continue antihist, singular and nasal steroid as ongoing - also asthma tx as below Work note yesterday thru weekend provided

## 2012-08-22 NOTE — Assessment & Plan Note (Signed)
Recurrent flares and spring - resolved bronchospasm from last week changed Dulera to steroid only inhaler 12/12 due to tremor side effects -but poorly controlled symptoms spring 2013 Changed to advair 07/2011 - continue same with prn Alb

## 2012-08-28 ENCOUNTER — Other Ambulatory Visit: Payer: Self-pay

## 2012-08-28 NOTE — Telephone Encounter (Signed)
Notified pt with md response. Will come & pick up samples, but pt is wanting to know will md give her an excuse note pt states she hasn't been to work all week due to having sxs...Raechel Chute

## 2012-08-28 NOTE — Telephone Encounter (Signed)
Concerns noted Although patient states metformin caused GI side effects, it is possible the extended release will be better tolerated I would like her to try combined Januvia (as she is already taking) with extended release metformin in one tablet called "janumet xr" Will provide 2 weeks of samples, available for pickup in our office We will consider continuing this versus change other m trialdication depending on her CBG results and potential side effects after 2 weeks

## 2012-08-28 NOTE — Telephone Encounter (Signed)
Pt called stating that although her blood sugars has decreased to low 200 - mid 100 but Glipizide is causing severe diarrhea with some stomach upset. Pt is requesting an alterative medication (not Metformin as it causes the same side effect).

## 2012-08-28 NOTE — Telephone Encounter (Signed)
Sorry, no - sugar low 200s will not cause blurring or other symptoms

## 2012-08-31 ENCOUNTER — Encounter: Payer: Self-pay | Admitting: Internal Medicine

## 2012-08-31 MED ORDER — SITAGLIP PHOS-METFORMIN HCL ER 100-1000 MG PO TB24: 1.0000 | ORAL_TABLET | Freq: Every day | ORAL | Status: DC

## 2012-08-31 NOTE — Telephone Encounter (Signed)
Pt states she was advised thatMD declined to provide work excuse when she came to office to pick up samples.

## 2012-09-07 ENCOUNTER — Encounter (INDEPENDENT_AMBULATORY_CARE_PROVIDER_SITE_OTHER): Payer: BC Managed Care – PPO | Admitting: Ophthalmology

## 2012-09-08 ENCOUNTER — Encounter (INDEPENDENT_AMBULATORY_CARE_PROVIDER_SITE_OTHER): Payer: BC Managed Care – PPO | Admitting: Ophthalmology

## 2012-09-08 DIAGNOSIS — H35039 Hypertensive retinopathy, unspecified eye: Secondary | ICD-10-CM

## 2012-09-08 DIAGNOSIS — E11319 Type 2 diabetes mellitus with unspecified diabetic retinopathy without macular edema: Secondary | ICD-10-CM

## 2012-09-08 DIAGNOSIS — I10 Essential (primary) hypertension: Secondary | ICD-10-CM

## 2012-09-08 DIAGNOSIS — H43819 Vitreous degeneration, unspecified eye: Secondary | ICD-10-CM

## 2012-09-08 DIAGNOSIS — E1139 Type 2 diabetes mellitus with other diabetic ophthalmic complication: Secondary | ICD-10-CM

## 2012-09-08 LAB — HM DIABETES EYE EXAM

## 2012-09-15 ENCOUNTER — Encounter: Payer: Self-pay | Admitting: Internal Medicine

## 2012-09-15 ENCOUNTER — Telehealth: Payer: Self-pay | Admitting: *Deleted

## 2012-09-15 MED ORDER — SITAGLIP PHOS-METFORMIN HCL ER 100-1000 MG PO TB24: 1.0000 | ORAL_TABLET | Freq: Every day | ORAL | Status: DC

## 2012-09-15 NOTE — Telephone Encounter (Signed)
Pt states md gave samples of janumet. Finish med & it is working for her. Requesting rx to be sent to her pharmacy. Infirm pt will send...lmb

## 2012-10-01 ENCOUNTER — Institutional Professional Consult (permissible substitution): Payer: BC Managed Care – PPO | Admitting: Internal Medicine

## 2012-10-05 ENCOUNTER — Institutional Professional Consult (permissible substitution): Payer: Self-pay | Admitting: Internal Medicine

## 2012-10-12 ENCOUNTER — Ambulatory Visit: Payer: BC Managed Care – PPO | Admitting: Internal Medicine

## 2012-10-14 ENCOUNTER — Ambulatory Visit (INDEPENDENT_AMBULATORY_CARE_PROVIDER_SITE_OTHER): Payer: BC Managed Care – PPO | Admitting: Internal Medicine

## 2012-10-14 ENCOUNTER — Encounter: Payer: Self-pay | Admitting: Internal Medicine

## 2012-10-14 ENCOUNTER — Other Ambulatory Visit (INDEPENDENT_AMBULATORY_CARE_PROVIDER_SITE_OTHER): Payer: BC Managed Care – PPO

## 2012-10-14 VITALS — BP 130/84 | HR 52 | Temp 97.5°F | Wt 328.4 lb

## 2012-10-14 DIAGNOSIS — IMO0001 Reserved for inherently not codable concepts without codable children: Secondary | ICD-10-CM

## 2012-10-14 DIAGNOSIS — I10 Essential (primary) hypertension: Secondary | ICD-10-CM

## 2012-10-14 DIAGNOSIS — M25569 Pain in unspecified knee: Secondary | ICD-10-CM

## 2012-10-14 DIAGNOSIS — M25561 Pain in right knee: Secondary | ICD-10-CM

## 2012-10-14 LAB — LIPID PANEL
HDL: 50.2 mg/dL (ref >39.00)
Triglycerides: 84 mg/dL (ref 0.0–149.0)
VLDL: 16.8 mg/dL (ref 0.0–40.0)

## 2012-10-14 LAB — MICROALBUMIN / CREATININE URINE RATIO
Creatinine,U: 155.8 mg/dL
Microalb Creat Ratio: 2.9 mg/g (ref 0.0–30.0)

## 2012-10-14 MED ORDER — IBUPROFEN 800 MG PO TABS: 800.0000 mg | ORAL_TABLET | Freq: Three times a day (TID) | ORAL | Status: DC | PRN

## 2012-10-14 MED ORDER — LOSARTAN POTASSIUM 100 MG PO TABS: 100.0000 mg | ORAL_TABLET | Freq: Every day | ORAL | Status: DC

## 2012-10-14 NOTE — Patient Instructions (Signed)
It was good to see you today. We have reviewed your prior records including labs and tests today Test(s) ordered today. Your results will be released to MyChart (or called to you) after review, usually within 72hours after test completion. If any changes need to be made, you will be notified at that same time. we'll make referral to orthopedics for your knee pain . Our office will contact you regarding appointment(s) once made. Take Aleve 2 tabs 2x/day until specialist evaluation for your knee -  Change combination blood pressure pills to solo losartan once daily - Your prescription(s) have been submitted to your pharmacy. Please take as directed and contact our office if you believe you are having problem(s) with the medication(s) Other medications reviewed and updated, no additional changes Followup in 3 months for diabetes and weight check, call sooner if problems

## 2012-10-14 NOTE — Progress Notes (Signed)
  Subjective:    Patient ID: Debra [name], female    DOB: Dec 29, 1975, 37 y.o.   MRN: [id number]  HPI  Here for follow up   Complains of recurrent right knee pain Precipitated by twisting injury while walking on sand Onset 3 weeks ago, history of same and 2011 Associated with mild swelling and painful weightbearing No other joint swelling, no other injury, no trauma or falls Pain symptoms have not improved with over-the-counter once daily anti-inflammatory aleve  Past Medical History  Diagnosis Date  . Diabetes mellitus type II 08/2009 dx  . Morbid obesity     s/p lab band 09/2010  . Knee pain, right   . Asthma   . Allergic rhinitis, cause unspecified   . Ovarian cyst     right s/p resection June 2013  . Hypertension   . Endometriosis     Review of Systems  Constitutional: Positive for fatigue. Negative for fever and unexpected weight change.  Cardiovascular: Negative for chest pain and leg swelling.  Endocrine: Negative for polydipsia and polyuria.  Musculoskeletal: Positive for joint swelling (R knee).  Allergic/Immunologic: Positive for environmental allergies.  Neurological: Negative for numbness and headaches.       Objective:   Physical Exam  BP 130/84  Pulse 52  Temp(Src) 97.5 F (36.4 C) (Oral)  Wt 328 lb 6.4 oz (148.961 kg)  BMI 52.22 kg/m2  SpO2 99%  LMP 07/28/2011 Wt Readings from Last 3 Encounters:  10/14/12 328 lb 6.4 oz (148.961 kg)  08/14/12 327 lb 6.4 oz (148.508 kg)  08/08/12 329 lb (149.233 kg)   Constitutional: She is obese, but appears well-developed and well-nourished. No distress.  Neck: Normal range of motion. Neck supple. No JVD present. No thyromegaly present.  Cardiovascular: Normal rate, regular rhythm and normal heart sounds.  No murmur heard. No BLE edema. Pulmonary/Chest: Effort normal and breath sounds normal. No respiratory distress. She has no wheezes. MSkel: R knee - mild effusion compared to L knee - tender to palpation over  joint line; FROM and ligamentous function intact but painful WB activity and flexion Psychiatric: She has a normal mood and affect. Her behavior is normal. Judgment and thought content normal.   Lab Results  Component Value Date   WBC 9.9 08/08/2011   HGB 12.1 08/08/2011   HCT 39.5 08/08/2011   PLT 317 08/08/2011   GLUCOSE 154* 01/07/2012   CHOL 151 07/22/2011   TRIG 58.0 07/22/2011   HDL 49.60 07/22/2011   LDLCALC 90 07/22/2011   ALT 12 08/08/2011   AST 11 08/08/2011   NA 139 01/07/2012   K 3.4* 01/07/2012   CL 97 01/07/2012   CREATININE 0.8 01/07/2012   BUN 10 01/07/2012   CO2 30 01/07/2012   TSH 1.24 07/22/2011   HGBA1C 9.3* 07/14/2012        Assessment & Plan:   See problem list. Medications and labs reviewed today.

## 2012-10-14 NOTE — Assessment & Plan Note (Signed)
no medication needs following lap band 09/2010 - diet control and off metformin since 11/2010 Metformin resumed 12/2011 due to a1c>7 - stoped due to GI side effects  started Januvia 06/2012 Acute exacerbation due to steroids for asthma/allergies late 07/2012 Brief trial glipizide 08/2012 then changed to metformin combo with januvia Check a1c now with microalb and lipids, titrate as needed  pt will call if cbg>250 or <100  Lab Results  Component Value Date   HGBA1C 9.3* 07/14/2012

## 2012-10-14 NOTE — Assessment & Plan Note (Signed)
Hx same - DJD "twist injury" walking on sand in [place] 3 weeks ago Mild effusion and painful WB but ligamentous fx appears intact  Refer to ortho (seen MW in 2011) - defer imaging to them as needed

## 2012-10-14 NOTE — Assessment & Plan Note (Signed)
strong FH same started ARB early 08/2011 added HCTZ late 08/2011 - changed to combo 12/2011, but pt doesn't feel she needs diuretic - stop same The current medical regimen is effective;  continue present plan and medications.    BP Readings from Last 3 Encounters:  10/14/12 130/84  08/21/12 130/82  08/14/12 110/72

## 2012-10-15 NOTE — Addendum Note (Signed)
Addended by: Rene Paci A on: 10/15/2012 07:25 AM   Modules accepted: Orders

## 2012-10-26 ENCOUNTER — Encounter: Payer: Self-pay | Admitting: Internal Medicine

## 2012-10-31 ENCOUNTER — Encounter (HOSPITAL_COMMUNITY): Payer: Self-pay | Admitting: Emergency Medicine

## 2012-10-31 ENCOUNTER — Emergency Department (HOSPITAL_COMMUNITY): Payer: BC Managed Care – PPO

## 2012-10-31 ENCOUNTER — Emergency Department (HOSPITAL_COMMUNITY)
Admission: EM | Admit: 2012-10-31 | Discharge: 2012-10-31 | Disposition: A | Discharge status: Home / Self Care | Payer: BC Managed Care – PPO | Attending: Emergency Medicine | Admitting: Emergency Medicine

## 2012-10-31 DIAGNOSIS — Z8739 Personal history of other diseases of the musculoskeletal system and connective tissue: Secondary | ICD-10-CM | POA: Insufficient documentation

## 2012-10-31 DIAGNOSIS — S6980XA Other specified injuries of unspecified wrist, hand and finger(s), initial encounter: Secondary | ICD-10-CM | POA: Insufficient documentation

## 2012-10-31 DIAGNOSIS — Y939 Activity, unspecified: Secondary | ICD-10-CM | POA: Insufficient documentation

## 2012-10-31 DIAGNOSIS — S6991XA Unspecified injury of right wrist, hand and finger(s), initial encounter: Secondary | ICD-10-CM

## 2012-10-31 DIAGNOSIS — Y929 Unspecified place or not applicable: Secondary | ICD-10-CM | POA: Insufficient documentation

## 2012-10-31 DIAGNOSIS — Z9884 Bariatric surgery status: Secondary | ICD-10-CM | POA: Insufficient documentation

## 2012-10-31 DIAGNOSIS — Z8742 Personal history of other diseases of the female genital tract: Secondary | ICD-10-CM | POA: Insufficient documentation

## 2012-10-31 DIAGNOSIS — E119 Type 2 diabetes mellitus without complications: Secondary | ICD-10-CM | POA: Insufficient documentation

## 2012-10-31 DIAGNOSIS — S6990XA Unspecified injury of unspecified wrist, hand and finger(s), initial encounter: Secondary | ICD-10-CM | POA: Insufficient documentation

## 2012-10-31 DIAGNOSIS — J45909 Unspecified asthma, uncomplicated: Secondary | ICD-10-CM | POA: Insufficient documentation

## 2012-10-31 DIAGNOSIS — I10 Essential (primary) hypertension: Secondary | ICD-10-CM | POA: Insufficient documentation

## 2012-10-31 DIAGNOSIS — W230XXA Caught, crushed, jammed, or pinched between moving objects, initial encounter: Secondary | ICD-10-CM | POA: Insufficient documentation

## 2012-10-31 DIAGNOSIS — Z79899 Other long term (current) drug therapy: Secondary | ICD-10-CM | POA: Insufficient documentation

## 2012-10-31 MED ORDER — TRAMADOL HCL 50 MG PO TABS
50.0000 mg | ORAL_TABLET | Freq: Four times a day (QID) | ORAL | Status: DC | PRN
Start: 2012-10-31 — End: 2013-04-05

## 2012-10-31 NOTE — ED Notes (Signed)
Patient reports that she hurt her right thumb in a door a few minutes ago

## 2012-10-31 NOTE — ED Provider Notes (Signed)
History    This chart was scribed for non-physician practitioner Magnus Sinning, PA working with Benny Lennert, MD by Quintella Reichert, ED Scribe. This patient was seen in room WTR8/WTR8 and the patient's care was started at 10:43 PM.   CSN: [id number]  Arrival date & time 10/31/12  2115    Chief Complaint  Patient presents with  . Hand Pain    The history is provided by the patient. No language interpreter was used.     HPI Comments: Debra [name] is a 37 y.o. female who presents to the Emergency Department complaining of a right thumb injury that she sustained one hour ago when she accidentally closed the thumb in a car door.  Pt states that she immediately developed constant, moderate-to-severe pain localized to the DIP joint of the right thumb.  Pain is greatly exacerbated by moving the thumb.  She denies weakness, numbness or tingling to the area.  No laceration.  She denies pain or injury to any other area.  She has not taken anything for pain prior to arrival.       Past Medical History  Diagnosis Date  . Diabetes mellitus type II 08/2009 dx  . Morbid obesity     s/p lab band 09/2010  . Knee pain, right   . Asthma   . Allergic rhinitis, cause unspecified   . Ovarian cyst     right s/p resection June 2013  . Hypertension   . Endometriosis     Past Surgical History  Procedure Laterality Date  . Laparoscopic gastric banding  10/02/2010    start weight 349#  . Salpingoophorectomy  10/08/11    right  . Abdominal hysterectomy  05/14/12    [place]    Family History  Problem Relation Age of Onset  . Heart disease Mother   . Hypertension Mother   . Stroke Mother   . Diabetes Father     History  Substance Use Topics  . Smoking status: Never Smoker   . Smokeless tobacco: Not on file  . Alcohol Use: No    OB History   Grav Para Term Preterm Abortions TAB SAB Ect Mult Living                   Review of Systems  Musculoskeletal:  Positive for arthralgias.  Neurological: Negative for weakness and numbness.  All other systems reviewed and are negative.      Allergies  Review of patient's allergies indicates no known allergies.  Home Medications   Current Outpatient Rx  Name  Route  Sig  Dispense  Refill  . albuterol (PROAIR HFA) 108 (90 BASE) MCG/ACT inhaler   Inhalation   Inhale 2 puffs into the lungs every 4 (four) hours as needed.   18 g   2   . fexofenadine (ALLEGRA) 180 MG tablet   Oral   Take 180 mg by mouth daily as needed. For seasonal allergies         . Fluticasone-Salmeterol (ADVAIR DISKUS) 250-50 MCG/DOSE AEPB   Inhalation   Inhale 1 puff into the lungs 2 (two) times daily.   1 each   3   . glucose blood (ONE TOUCH ULTRA TEST) test strip      Use as directed   100 each   5   . ibuprofen (ADVIL,MOTRIN) 800 MG tablet   Oral   Take 1 tablet (800 mg total) by mouth every 8 (eight) hours as needed. For  pain   30 tablet   0   . losartan (COZAAR) 100 MG tablet   Oral   Take 1 tablet (100 mg total) by mouth daily.   90 tablet   3   . mometasone (NASONEX) 50 MCG/ACT nasal spray   Nasal   Place 2 sprays into the nose daily as needed. For nasal spray         . montelukast (SINGULAIR) 10 MG tablet   Oral   Take 1 tablet (10 mg total) by mouth at bedtime.   30 tablet   11   . olopatadine (PATANOL) 0.1 % ophthalmic solution   Both Eyes   Place 1 drop into both eyes 2 (two) times daily.   5 mL   12   . sitaGLIPtin (JANUVIA) 100 MG tablet   Oral   Take 100 mg by mouth daily.         . SitaGLIPtin-MetFORMIN HCl [id number] MG TB24   Oral   Take 1 tablet by mouth daily.   30 tablet   5    BP 167/86  Pulse 63  Temp(Src) 98.1 F (36.7 C) (Oral)  Resp 18  SpO2 99%  LMP 07/28/2011  Physical Exam  Nursing note and vitals reviewed. Constitutional: She appears well-developed and well-nourished. No distress.  HENT:  Head: Normocephalic and atraumatic.  Eyes:  Conjunctivae are normal.  Neck: Normal range of motion. Neck supple.  Cardiovascular: Normal rate, regular rhythm and normal heart sounds.   No murmur heard. Pulses:      Radial pulses are 2+ on the right side, and 2+ on the left side.  Pulmonary/Chest: Effort normal and breath sounds normal. No respiratory distress. She has no wheezes. She has no rales.  Musculoskeletal:  ROM of right DIP joint limited due to pain. Some swelling over DIP. No laceration or break in the skin.  Neurological: She is alert.  Sensation intact to the right thumb.  Skin: Skin is warm and dry.  Psychiatric: She has a normal mood and affect. Her behavior is normal.    ED Course  Procedures (including critical care time)  DIAGNOSTIC STUDIES: Oxygen Saturation is 99% on room air, normal by my interpretation.    COORDINATION OF CARE: 10:47 PM: Informed pt that imaging did not reveal a fracture. Discussed treatment plan which includes pain medication, thumb splint application and ice.  Pt expressed understanding and agreed to plan.    Labs Reviewed - No data to display   Dg Finger Thumb Right  10/31/2012   *RADIOLOGY REPORT*  Clinical Data: Distal right thumb pain.  The patient slammed thumb in car door today.  RIGHT THUMB 2+V  Comparison: Right wrist 06/07/2012  Findings: Mild degenerative changes in the first metacarpal phalangeal joint.  Right first finger appears otherwise normal.  No evidence of acute fracture or subluxation.  No focal bone lesion or bone destruction.  Bone cortex and trabecular architecture appear intact.  No abnormal radiopaque soft tissue foreign bodies.  IMPRESSION: No acute bony abnormalities demonstrated in the right first finger.   Original Report Authenticated By: Burman Nieves, M.D.    No diagnosis found.   MDM  Patient presenting with pain of her right thumb after shutting it in a car door.  Mild swelling of the thumb.  No laceration.  Xray negative.  Patient  neurovascularly intact.  Patient given finger splint.  Feel that the patient is stable for discharge.  I personally performed the services described in this documentation, which  was scribed in my presence. The recorded information has been reviewed and is accurate.        Pascal Lux [place], PA-C 10/31/12 2330

## 2012-10-31 NOTE — ED Provider Notes (Signed)
Medical screening examination/treatment/procedure(s) were performed by non-physician practitioner and as supervising physician I was immediately available for consultation/collaboration.   Rodderick Holtzer L Ricke Kimoto, MD 10/31/12 2331 

## 2012-11-05 ENCOUNTER — Ambulatory Visit: Payer: BC Managed Care – PPO | Admitting: Endocrinology

## 2012-11-05 DIAGNOSIS — Z0289 Encounter for other administrative examinations: Secondary | ICD-10-CM

## 2012-11-20 ENCOUNTER — Institutional Professional Consult (permissible substitution): Payer: BC Managed Care – PPO | Admitting: Internal Medicine

## 2012-12-27 ENCOUNTER — Ambulatory Visit (INDEPENDENT_AMBULATORY_CARE_PROVIDER_SITE_OTHER): Payer: BC Managed Care – PPO | Admitting: Family Medicine

## 2012-12-27 VITALS — BP 154/102 | HR 70 | Temp 100.2°F | Resp 18 | Ht 67.0 in | Wt 327.0 lb

## 2012-12-27 DIAGNOSIS — J029 Acute pharyngitis, unspecified: Secondary | ICD-10-CM

## 2012-12-27 LAB — POCT RAPID STREP A (OFFICE): Rapid Strep A Screen: NEGATIVE

## 2012-12-27 MED ORDER — AMOXICILLIN 875 MG PO TABS: 875.0000 mg | ORAL_TABLET | Freq: Two times a day (BID) | ORAL | Status: DC

## 2012-12-27 NOTE — Patient Instructions (Addendum)
Sore Throat A sore throat is pain, burning, irritation, or scratchiness of the throat. There is often pain or tenderness when swallowing or talking. A sore throat may be accompanied by other symptoms, such as coughing, sneezing, fever, and swollen neck glands. A sore throat is often the first sign of another sickness, such as a cold, flu, strep throat, or mononucleosis (commonly known as mono). Most sore throats go away without medical treatment. CAUSES  The most common causes of a sore throat include:  A viral infection, such as a cold, flu, or mono.  A bacterial infection, such as strep throat, tonsillitis, or whooping cough.  Seasonal allergies.  Dryness in the air.  Irritants, such as smoke or pollution.  Gastroesophageal reflux disease (GERD). HOME CARE INSTRUCTIONS   Only take over-the-counter medicines as directed by your caregiver.  Drink enough fluids to keep your urine clear or pale yellow.  Rest as needed.  Try using throat sprays, lozenges, or sucking on hard candy to ease any pain (if older than 4 years or as directed).  Sip warm liquids, such as broth, herbal tea, or warm water with honey to relieve pain temporarily. You may also eat or drink cold or frozen liquids such as frozen ice pops.  Gargle with salt water (mix 1 tsp salt with 8 oz of water).  Do not smoke and avoid secondhand smoke.  Put a cool-mist humidifier in your bedroom at night to moisten the air. You can also turn on a hot shower and sit in the bathroom with the door closed for 5 10 minutes. SEEK IMMEDIATE MEDICAL CARE IF:  You have difficulty breathing.  You are unable to swallow fluids, soft foods, or your saliva.  You have increased swelling in the throat.  Your sore throat does not get better in 7 days.  You have nausea and vomiting.  You have a fever or persistent symptoms for more than 2 3 days.  You have a fever and your symptoms suddenly get worse. MAKE SURE YOU:   Understand  these instructions.  Will watch your condition.  Will get help right away if you are not doing well or get worse. Document Released: 05/16/2004 Document Revised: 03/25/2012 Document Reviewed: 12/15/2011 [employer] Patient Information 2014 [employer].  

## 2012-12-27 NOTE — Progress Notes (Signed)
  Subjective:    Patient ID: Debra [name], female    DOB: 03/31/1976, 37 y.o.   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  Feels like throat is pulsing. Loss of appetite due to sore throat .   Patient see's [facility] for Diabetes and hypertension. 140/86.  Review of Systems     Objective:   Physical Exam Woman in no acute distress HEENT: Unremarkable with exception of very red throat without exudates. Neck: Supple no adenopathy Chest: Clear Heart: Regular no murmur Extremities: No edema Results for orders placed in visit on 12/27/12  POCT RAPID STREP A (OFFICE)      Result Value Range   Rapid Strep A Screen Negative  Negative          Assessment & Plan:  Sore throat - Plan: POCT rapid strep A, amoxicillin (AMOXIL) 875 MG tablet  Signed, Elvina Sidle, MD

## 2012-12-30 ENCOUNTER — Encounter (HOSPITAL_COMMUNITY): Payer: Self-pay | Admitting: *Deleted

## 2012-12-30 ENCOUNTER — Emergency Department (HOSPITAL_COMMUNITY)
Admission: EM | Admit: 2012-12-30 | Discharge: 2012-12-30 | Disposition: A | Discharge status: Home / Self Care | Payer: BC Managed Care – PPO | Attending: Emergency Medicine | Admitting: Emergency Medicine

## 2012-12-30 DIAGNOSIS — IMO0002 Reserved for concepts with insufficient information to code with codable children: Secondary | ICD-10-CM | POA: Insufficient documentation

## 2012-12-30 DIAGNOSIS — Z9884 Bariatric surgery status: Secondary | ICD-10-CM | POA: Insufficient documentation

## 2012-12-30 DIAGNOSIS — E119 Type 2 diabetes mellitus without complications: Secondary | ICD-10-CM | POA: Insufficient documentation

## 2012-12-30 DIAGNOSIS — Z862 Personal history of diseases of the blood and blood-forming organs and certain disorders involving the immune mechanism: Secondary | ICD-10-CM | POA: Insufficient documentation

## 2012-12-30 DIAGNOSIS — Z8639 Personal history of other endocrine, nutritional and metabolic disease: Secondary | ICD-10-CM | POA: Insufficient documentation

## 2012-12-30 DIAGNOSIS — R0981 Nasal congestion: Secondary | ICD-10-CM

## 2012-12-30 DIAGNOSIS — IMO0001 Reserved for inherently not codable concepts without codable children: Secondary | ICD-10-CM | POA: Insufficient documentation

## 2012-12-30 DIAGNOSIS — I10 Essential (primary) hypertension: Secondary | ICD-10-CM | POA: Insufficient documentation

## 2012-12-30 DIAGNOSIS — J3489 Other specified disorders of nose and nasal sinuses: Secondary | ICD-10-CM | POA: Insufficient documentation

## 2012-12-30 DIAGNOSIS — J029 Acute pharyngitis, unspecified: Secondary | ICD-10-CM | POA: Insufficient documentation

## 2012-12-30 DIAGNOSIS — Z8742 Personal history of other diseases of the female genital tract: Secondary | ICD-10-CM | POA: Insufficient documentation

## 2012-12-30 DIAGNOSIS — R5381 Other malaise: Secondary | ICD-10-CM | POA: Insufficient documentation

## 2012-12-30 DIAGNOSIS — R509 Fever, unspecified: Secondary | ICD-10-CM | POA: Insufficient documentation

## 2012-12-30 DIAGNOSIS — J45909 Unspecified asthma, uncomplicated: Secondary | ICD-10-CM | POA: Insufficient documentation

## 2012-12-30 DIAGNOSIS — R059 Cough, unspecified: Secondary | ICD-10-CM | POA: Insufficient documentation

## 2012-12-30 DIAGNOSIS — H9209 Otalgia, unspecified ear: Secondary | ICD-10-CM | POA: Insufficient documentation

## 2012-12-30 DIAGNOSIS — R05 Cough: Secondary | ICD-10-CM | POA: Insufficient documentation

## 2012-12-30 DIAGNOSIS — Z79899 Other long term (current) drug therapy: Secondary | ICD-10-CM | POA: Insufficient documentation

## 2012-12-30 DIAGNOSIS — H9203 Otalgia, bilateral: Secondary | ICD-10-CM

## 2012-12-30 MED ORDER — PSEUDOEPHEDRINE HCL 30 MG PO TABS: 30.0000 mg | ORAL_TABLET | ORAL | Status: DC | PRN

## 2012-12-30 MED ORDER — GUAIFENESIN ER 600 MG PO TB12: 1200.0000 mg | ORAL_TABLET | Freq: Two times a day (BID) | ORAL | Status: DC

## 2012-12-30 MED ORDER — FLUTICASONE PROPIONATE 50 MCG/ACT NA SUSP: 2.0000 | Freq: Every day | NASAL | Status: DC

## 2012-12-30 NOTE — ED Provider Notes (Signed)
Medical screening examination/treatment/procedure(s) were performed by non-physician practitioner and as supervising physician I was immediately available for consultation/collaboration.  Tashya Alberty M Tanay Massiah, MD 12/30/12 0854 

## 2012-12-30 NOTE — ED Provider Notes (Signed)
CSN: [id number]     Arrival date & time 12/30/12  0441 History   First MD Initiated Contact with Patient 12/30/12 0447     Chief Complaint  Patient presents with  . Otalgia   HPI  History provided by patient. Patient is a 37 year old female with history of obesity, hypertension, diabetes and asthma who presents with complaints of congestion and ear pain. Patient states she's been feeling sick with congestion, fever, sore throat and slight cough for the past 5 days. She was seen in urgent care and given a prescription for amoxicillin Sunday. She has been taking this for the past 3 days states she has continued to have congestion and now worsening ear pain. Initially pain was worse in the right ear she felt her ear cough with some drainage but now has more pain on her left ear. She has taken a few doses of ibuprofen which helped slightly with pain and comfort. No other medications used. She denies any continued fever symptoms. Cough is mild. She denies any shortness of breath or chest pain. Denies any nausea or vomiting. No other aggravating or alleviating factors. No other associated symptoms    Past Medical History  Diagnosis Date  . Diabetes mellitus type II 08/2009 dx  . Morbid obesity     s/p lab band 09/2010  . Knee pain, right   . Asthma   . Allergic rhinitis, cause unspecified   . Ovarian cyst     right s/p resection June 2013  . Hypertension   . Endometriosis    Past Surgical History  Procedure Laterality Date  . Laparoscopic gastric banding  10/02/2010    start weight 349#  . Salpingoophorectomy  10/08/11    right  . Abdominal hysterectomy  05/14/12    [place] [facility]   Family History  Problem Relation Age of Onset  . Heart disease Mother   . Hypertension Mother   . Stroke Mother   . Diabetes Father    History  Substance Use Topics  . Smoking status: Never Smoker   . Smokeless tobacco: Not on file  . Alcohol Use: No   OB History   Grav Para Term Preterm  Abortions TAB SAB Ect Mult Living                 Review of Systems  Constitutional: Positive for fever and fatigue. Negative for chills and diaphoresis.  HENT: Positive for ear pain, congestion, sore throat and rhinorrhea. Negative for hearing loss.   Respiratory: Positive for cough. Negative for shortness of breath.   Cardiovascular: Negative for chest pain.  Gastrointestinal: Negative for nausea, vomiting and diarrhea.  Musculoskeletal: Positive for myalgias.  All other systems reviewed and are negative.    Allergies  Review of patient's allergies indicates no known allergies.  Home Medications   Current Outpatient Rx  Name  Route  Sig  Dispense  Refill  . albuterol (PROAIR HFA) 108 (90 BASE) MCG/ACT inhaler   Inhalation   Inhale 2 puffs into the lungs every 4 (four) hours as needed.   18 g   2   . amoxicillin (AMOXIL) 875 MG tablet   Oral   Take 1 tablet (875 mg total) by mouth 2 (two) times daily.   20 tablet   0   . fexofenadine (ALLEGRA) 180 MG tablet   Oral   Take 180 mg by mouth daily as needed. For seasonal allergies         . Fluticasone-Salmeterol (ADVAIR DISKUS)  250-50 MCG/DOSE AEPB   Inhalation   Inhale 1 puff into the lungs 2 (two) times daily.   1 each   3   . glucose blood (ONE TOUCH ULTRA TEST) test strip      Use as directed   100 each   5   . ibuprofen (ADVIL,MOTRIN) 800 MG tablet   Oral   Take 1 tablet (800 mg total) by mouth every 8 (eight) hours as needed. For pain   30 tablet   0   . losartan (COZAAR) 100 MG tablet   Oral   Take 1 tablet (100 mg total) by mouth daily.   90 tablet   3   . mometasone (NASONEX) 50 MCG/ACT nasal spray   Nasal   Place 2 sprays into the nose daily as needed. For nasal spray         . montelukast (SINGULAIR) 10 MG tablet   Oral   Take 1 tablet (10 mg total) by mouth at bedtime.   30 tablet   11   . olopatadine (PATANOL) 0.1 % ophthalmic solution   Both Eyes   Place 1 drop into both eyes 2  (two) times daily.   5 mL   12   . sitaGLIPtin (JANUVIA) 100 MG tablet   Oral   Take 100 mg by mouth daily.         . SitaGLIPtin-MetFORMIN HCl [id number] MG TB24   Oral   Take 1 tablet by mouth daily.   30 tablet   5   . traMADol (ULTRAM) 50 MG tablet   Oral   Take 1 tablet (50 mg total) by mouth every 6 (six) hours as needed for pain.   15 tablet   0    BP 162/100  Pulse 68  Temp(Src) 97.7 F (36.5 C) (Oral)  Resp 20  Ht 5\' 7"  (1.702 m)  Wt 321 lb (145.605 kg)  BMI 50.26 kg/m2  SpO2 98%  LMP 07/28/2011 Physical Exam  Nursing note and vitals reviewed. Constitutional: She is oriented to person, place, and time. She appears well-developed and well-nourished. No distress.  HENT:  Head: Normocephalic.  Right Ear: External ear normal.  Left Ear: External ear normal.  Nose: Mucosal edema and rhinorrhea present.  Mouth/Throat: Uvula is midline, oropharynx is clear and moist and mucous membranes are normal.  Mild erythema of both TMs worse on the left. There is signs of effusion without any purulence.  Eyes: Conjunctivae and EOM are normal. Pupils are equal, round, and reactive to light.  Neck: Normal range of motion. Neck supple.  Cardiovascular: Normal rate and regular rhythm.   Pulmonary/Chest: Effort normal and breath sounds normal. No stridor. No respiratory distress. She has no wheezes. She has no rales.  Abdominal: Soft.  Musculoskeletal: Normal range of motion.  Lymphadenopathy:    She has no cervical adenopathy.  Neurological: She is alert and oriented to person, place, and time.  Skin: Skin is warm and dry. No rash noted.  Psychiatric: She has a normal mood and affect. Her behavior is normal.    ED Course  Procedures        MDM   1. Nasal congestion   2. Otalgia of both ears      5:00 AM patient seen and evaluated. Patient appears well in no acute distress. Does not appear severely ill or toxic.    Angus Seller, PA-C 12/30/12 [phone]

## 2012-12-30 NOTE — ED Notes (Signed)
Pt complains of ear pain bilaterally, beginning yesterday. Pt went to urgent care Saturday 12/26/12 for nose congestion, cough, fever. Pt was prescribed amoxicillin.

## 2012-12-30 NOTE — ED Notes (Signed)
Pt c/o bilateral earache since yesterday

## 2013-01-04 ENCOUNTER — Ambulatory Visit (INDEPENDENT_AMBULATORY_CARE_PROVIDER_SITE_OTHER): Payer: BC Managed Care – PPO | Admitting: Internal Medicine

## 2013-01-04 ENCOUNTER — Encounter: Payer: Self-pay | Admitting: Internal Medicine

## 2013-01-04 VITALS — BP 140/80 | HR 70 | Temp 97.3°F | Wt 325.0 lb

## 2013-01-04 DIAGNOSIS — H6593 Unspecified nonsuppurative otitis media, bilateral: Secondary | ICD-10-CM

## 2013-01-04 DIAGNOSIS — H938X3 Other specified disorders of ear, bilateral: Secondary | ICD-10-CM

## 2013-01-04 DIAGNOSIS — J309 Allergic rhinitis, unspecified: Secondary | ICD-10-CM

## 2013-01-04 DIAGNOSIS — H6590 Unspecified nonsuppurative otitis media, unspecified ear: Secondary | ICD-10-CM

## 2013-01-04 DIAGNOSIS — H938X9 Other specified disorders of ear, unspecified ear: Secondary | ICD-10-CM

## 2013-01-04 MED ORDER — PREDNISONE 10 MG PO TABS: ORAL_TABLET | ORAL | Status: DC

## 2013-01-04 NOTE — Progress Notes (Signed)
HPI  Pt presents to the clinic today with c/o bilateral ear pain. This started 1 week ago. She went to the urgent care and was told she had a virus. She was started on Amoxicillin. Her ears got so bad on Tuesday, she went to the ER. They told her that she had bilateral ear infection. They gave her the mucinex, nasal spray and sudafed. She does not feel like this has helped her ears though. The pain is better but now she is having trouble hearing. She is having some ringing in both ears. She denies fever, chills or body aches.  Review of Systems      Past Medical History  Diagnosis Date  . Diabetes mellitus type II 08/2009 dx  . Morbid obesity     s/p lab band 09/2010  . Knee pain, right   . Asthma   . Allergic rhinitis, cause unspecified   . Ovarian cyst     right s/p resection June 2013  . Hypertension   . Endometriosis     Family History  Problem Relation Age of Onset  . Heart disease Mother   . Hypertension Mother   . Stroke Mother   . Diabetes Father     History   Social History  . Marital Status: Single    Spouse Name: N/A    Number of Children: N/A  . Years of Education: N/A   Occupational History  . Not on file.   Social History Main Topics  . Smoking status: Never Smoker   . Smokeless tobacco: Not on file  . Alcohol Use: No  . Drug Use: No  . Sexual Activity: Not on file   Other Topics Concern  . Not on file   Social History Narrative   Single, lives with mom. Employed as school driver    No Known Allergies   Constitutional:  Denies headache, fatigue, fever or abrupt weight changes.  HEENT:  Positive ear fullness. Denies eye redness, eye pain, pressure behind the eyes, facial pain, nasal congestion, ear pain, wax buildup, runny nose or bloody nose. Respiratory:  Denies cough, difficulty breathing or shortness of breath.  Cardiovascular: Denies chest pain, chest tightness, palpitations or swelling in the hands or feet.   No other specific complaints  in a complete review of systems (except as listed in HPI above).  Objective:   BP 140/80  Pulse 70  Temp(Src) 97.3 F (36.3 C) (Oral)  Wt 325 lb (147.419 kg)  BMI 50.89 kg/m2  SpO2 97%  LMP 07/28/2011 Wt Readings from Last 3 Encounters:  01/04/13 325 lb (147.419 kg)  12/30/12 321 lb (145.605 kg)  12/27/12 327 lb (148.326 kg)     General: Appears her stated age, obese but well developed, well nourished in NAD. HEENT: Head: normal shape and size; Eyes: sclera white, no icterus, conjunctiva pink, PERRLA and EOMs intact; Ears: Tm's red but intact, normal light reflex, + effusion bilaterally; Nose: mucosa pink and moist, septum midline; Throat/Mouth: + PND. Teeth present, mucosa erythematous and moist, no exudate noted, no lesions or ulcerations noted.  Neck: Neck supple, trachea midline. No massses, lumps or thyromegaly present.  Cardiovascular: Normal rate and rhythm. S1,S2 noted.  No murmur, rubs or gallops noted. No JVD or BLE edema. No carotid bruits noted. Pulmonary/Chest: Normal effort and positive vesicular breath sounds. No respiratory distress. No wheezes, rales or ronchi noted.      Assessment & Plan:   Bilateral effusions in ears, secondary to allergic rhinitis:  Get some  rest and drink plenty of water Continue Sudafed, Mucinex, Allegra and nasal spray eRx for pred taper-monitor sugar more frequently, as this will increase your BS levels  RTC as needed or if symptoms persist.

## 2013-01-04 NOTE — Patient Instructions (Signed)
Allergic Rhinitis Allergic rhinitis is when the mucous membranes in the nose respond to allergens. Allergens are particles in the air that cause your body to have an allergic reaction. This causes you to release allergic antibodies. Through a chain of events, these eventually cause you to release histamine into the blood stream (hence the use of antihistamines). Although meant to be protective to the body, it is this release that causes your discomfort, such as frequent sneezing, congestion and an itchy runny nose.  CAUSES  The pollen allergens may come from grasses, trees, and weeds. This is seasonal allergic rhinitis, or "hay fever." Other allergens cause year-round allergic rhinitis (perennial allergic rhinitis) such as house dust mite allergen, pet dander and mold spores.  SYMPTOMS   Nasal stuffiness (congestion).  Runny, itchy nose with sneezing and tearing of the eyes.  There is often an itching of the mouth, eyes and ears. It cannot be cured, but it can be controlled with medications. DIAGNOSIS  If you are unable to determine the offending allergen, skin or blood testing may find it. TREATMENT   Avoid the allergen.  Medications and allergy shots (immunotherapy) can help.  Hay fever may often be treated with antihistamines in pill or nasal spray forms. Antihistamines block the effects of histamine. There are over-the-counter medicines that may help with nasal congestion and swelling around the eyes. Check with your caregiver before taking or giving this medicine. If the treatment above does not work, there are many new medications your caregiver can prescribe. Stronger medications may be used if initial measures are ineffective. Desensitizing injections can be used if medications and avoidance fails. Desensitization is when a patient is given ongoing shots until the body becomes less sensitive to the allergen. Make sure you follow up with your caregiver if problems continue. SEEK MEDICAL  CARE IF:   You develop fever (more than 100.5 F (38.1 C).  You develop a cough that does not stop easily (persistent).  You have shortness of breath.  You start wheezing.  Symptoms interfere with normal daily activities. Document Released: 01/01/2001 Document Revised: 07/01/2011 Document Reviewed: 07/13/2008 [employer] Patient Information 2014 [employer].  

## 2013-01-14 ENCOUNTER — Other Ambulatory Visit (INDEPENDENT_AMBULATORY_CARE_PROVIDER_SITE_OTHER): Payer: BC Managed Care – PPO

## 2013-01-14 ENCOUNTER — Ambulatory Visit (INDEPENDENT_AMBULATORY_CARE_PROVIDER_SITE_OTHER): Payer: BC Managed Care – PPO | Admitting: Internal Medicine

## 2013-01-14 ENCOUNTER — Encounter: Payer: Self-pay | Admitting: Internal Medicine

## 2013-01-14 VITALS — BP 140/82 | HR 59 | Temp 97.1°F | Wt 328.1 lb

## 2013-01-14 DIAGNOSIS — I10 Essential (primary) hypertension: Secondary | ICD-10-CM

## 2013-01-14 DIAGNOSIS — J309 Allergic rhinitis, unspecified: Secondary | ICD-10-CM

## 2013-01-14 LAB — HEMOGLOBIN A1C: Hgb A1c MFr Bld: 7.5 % — ABNORMAL HIGH (ref 4.6–6.5)

## 2013-01-14 LAB — BASIC METABOLIC PANEL
CO2: 30 mEq/L (ref 19–32)
Calcium: 8.8 mg/dL (ref 8.4–10.5)
GFR: 98.57 mL/min (ref >60.00)
Sodium: 138 mEq/L (ref 135–145)

## 2013-01-14 MED ORDER — HYDROCHLOROTHIAZIDE 25 MG PO TABS: 25.0000 mg | ORAL_TABLET | Freq: Every day | ORAL | Status: DC

## 2013-01-14 MED ORDER — PREDNISONE (PAK) 10 MG PO TABS: 10.0000 mg | ORAL_TABLET | ORAL | Status: DC

## 2013-01-14 NOTE — Progress Notes (Signed)
  Subjective:    Patient ID: Debra [name], female    DOB: 1975-08-05, 37 y.o.   MRN: [id number]  HPI  Here for follow up -reviewed chronic medical issues and interval medical event  Past Medical History  Diagnosis Date  . Diabetes mellitus type II 08/2009 dx  . Morbid obesity     s/p lab band 09/2010  . Knee pain, right   . Asthma   . Allergic rhinitis, cause unspecified   . Ovarian cyst     right s/p resection June 2013  . Hypertension   . Endometriosis    Review of Systems  Constitutional: Positive for fatigue. Negative for fever and unexpected weight change.  HENT: Positive for congestion, sore throat, sneezing, postnasal drip and sinus pressure. Negative for hearing loss, ear pain, facial swelling, trouble swallowing, neck pain and voice change.   Respiratory: Negative for cough and shortness of breath.   Cardiovascular: Negative for chest pain and leg swelling.  Endocrine: Negative for polydipsia and polyuria.  Allergic/Immunologic: Positive for environmental allergies.  Neurological: Negative for numbness and headaches.       Objective:   Physical Exam BP 140/82  Pulse 59  Temp(Src) 97.1 F (36.2 C) (Oral)  Wt 328 lb 1.9 oz (148.834 kg)  BMI 51.38 kg/m2  SpO2 99%  LMP 07/28/2011 Wt Readings from Last 3 Encounters:  01/14/13 328 lb 1.9 oz (148.834 kg)  01/04/13 325 lb (147.419 kg)  12/30/12 321 lb (145.605 kg)   Constitutional: She is obese, but appears well-developed and well-nourished. Hoarse voice but no distress.  HENT: normocephalic atraumatic, sinuses minimally tender to palpation over maxillary region. Moderately red oropharynx with postnasal drip, no exudate Neck: Normal range of motion. Neck supple. No JVD or LAD present. No thyromegaly present.  Cardiovascular: Normal rate, regular rhythm and normal heart sounds.  No murmur heard. No BLE edema. Pulmonary/Chest: Effort normal and breath sounds normal. No respiratory distress. She has no  wheezes. Psychiatric: She has a normal mood and affect. Her behavior is normal. Judgment and thought content normal.   Lab Results  Component Value Date   WBC 9.9 08/08/2011   HGB 12.1 08/08/2011   HCT 39.5 08/08/2011   PLT 317 08/08/2011   GLUCOSE 154* 01/07/2012   CHOL 159 10/14/2012   TRIG 84.0 10/14/2012   HDL 50.20 10/14/2012   LDLCALC 92 10/14/2012   ALT 12 08/08/2011   AST 11 08/08/2011   NA 139 01/07/2012   K 3.4* 01/07/2012   CL 97 01/07/2012   CREATININE 0.8 01/07/2012   BUN 10 01/07/2012   CO2 30 01/07/2012   TSH 1.24 07/22/2011   HGBA1C 9.5* 10/14/2012   MICROALBUR 4.5* 10/14/2012       Assessment & Plan:   Allergic sinusitis with chronic rhinitis symptoms, exacerbation by recent URI reviewed Treat with repeat prednisone taper over 6 days, continue antihistamine, nasal steroids, Mucinex and Sudafed Hold antibiotics as no evidence for persisting or current infection  Also See problem list. Medications and labs reviewed today.

## 2013-01-14 NOTE — Patient Instructions (Signed)
It was good to see you today. We have reviewed your prior records including labs and tests today Test(s) ordered today. Your results will be released to MyChart (or called to you) after review, usually within 72hours after test completion. If any changes need to be made, you will be notified at that same time. Medications reviewed and updated, Resume hydrochlorothiazide 25 mg each a.m. For blood pressure and take prednisone taper over next 6 days for sinus and head allergy symptoms -no other changes recommended at this time. Your prescription(s) have been submitted to your pharmacy. Please take as directed and contact our office if you believe you are having problem(s) with the medication(s). Please schedule followup in 3-4 months for diabetes mellitus and weight check, call sooner if problems.

## 2013-01-14 NOTE — Assessment & Plan Note (Signed)
strong FH same started ARB early 08/2011 added HCTZ late 08/2011 - changed to combo 12/2011, but pt doesn't feel she needs diuretic - stopped same - but now uncontrolled BP Resume HCTZ 25 qAM now  BP Readings from Last 3 Encounters:  01/14/13 140/82  01/04/13 140/80  12/30/12 147/78

## 2013-01-14 NOTE — Assessment & Plan Note (Signed)
Initially no medication needs following lap band 09/2010: diet control since 11/2010 Metformin resumed 12/2011 due to a1c>7 - but solo metformin stopped due to GI side effects  started Januvia 06/2012 Acute exacerbation due to steroids for asthma/allergies  Brief trial glipizide 08/2012 then changed to metformin combo with [place] Check a1c now, titrate as needed  pt will call if cbg>250 or <100  Lab Results  Component Value Date   HGBA1C 9.5* 10/14/2012

## 2013-04-05 ENCOUNTER — Other Ambulatory Visit (INDEPENDENT_AMBULATORY_CARE_PROVIDER_SITE_OTHER): Payer: BC Managed Care – PPO

## 2013-04-05 ENCOUNTER — Encounter: Payer: Self-pay | Admitting: Internal Medicine

## 2013-04-05 ENCOUNTER — Ambulatory Visit (INDEPENDENT_AMBULATORY_CARE_PROVIDER_SITE_OTHER): Payer: BC Managed Care – PPO | Admitting: Internal Medicine

## 2013-04-05 ENCOUNTER — Other Ambulatory Visit: Payer: Self-pay | Admitting: Internal Medicine

## 2013-04-05 VITALS — BP 142/88 | HR 56 | Temp 98.1°F | Wt 331.1 lb

## 2013-04-05 DIAGNOSIS — R300 Dysuria: Secondary | ICD-10-CM

## 2013-04-05 DIAGNOSIS — J45909 Unspecified asthma, uncomplicated: Secondary | ICD-10-CM

## 2013-04-05 DIAGNOSIS — I10 Essential (primary) hypertension: Secondary | ICD-10-CM

## 2013-04-05 LAB — URINALYSIS, ROUTINE W REFLEX MICROSCOPIC
Hgb urine dipstick: NEGATIVE
Nitrite: NEGATIVE
Urobilinogen, UA: 0.2 (ref 0.0–1.0)

## 2013-04-05 MED ORDER — AMLODIPINE BESYLATE 5 MG PO TABS: 5.0000 mg | ORAL_TABLET | Freq: Every day | ORAL | Status: DC

## 2013-04-05 NOTE — Progress Notes (Signed)
   Subjective:    Patient ID: Debra [name], female    DOB: 08/29/1975, 38 y.o.   MRN: [id number]  HPI  Here for follow up - reviewed chronic medical issues interval medical events: type 2 diabetes, hypertension, asthma  Past Medical History  Diagnosis Date  . Diabetes mellitus type II 08/2009 dx  . Morbid obesity     s/p lab band 09/2010  . Knee pain, right   . Asthma   . Allergic rhinitis, cause unspecified   . Ovarian cyst     right s/p resection June 2013  . Hypertension   . Endometriosis     Review of Systems  Constitutional: Negative for fatigue and unexpected weight change.  Respiratory: Negative for cough, shortness of breath and wheezing.   Cardiovascular: Negative for chest pain and leg swelling.  Genitourinary: Positive for dysuria and urgency.  Neurological: Negative for dizziness, light-headedness and headaches.  Psychiatric/Behavioral: Negative for dysphoric mood. The patient is not nervous/anxious.        Objective:   Physical Exam BP 142/88  Pulse 56  Temp(Src) 98.1 F (36.7 C) (Oral)  Wt 331 lb 1.9 oz (150.195 kg)  SpO2 99%  LMP 07/28/2011 Wt Readings from Last 3 Encounters:  04/05/13 331 lb 1.9 oz (150.195 kg)  01/14/13 328 lb 1.9 oz (148.834 kg)  01/04/13 325 lb (147.419 kg)   Constitutional: She is obese, but appears well-developed and well-nourished. Hoarse voice but no distress.  Neck: Normal range of motion. Neck supple. No JVD or LAD present. No thyromegaly present.  Cardiovascular: Normal rate, regular rhythm and normal heart sounds.  No murmur heard. No BLE edema. Pulmonary/Chest: Effort normal and breath sounds normal. No respiratory distress. She has no wheezes. Psychiatric: She has a normal mood and affect. Her behavior is normal. Judgment and thought content normal.   Lab Results  Component Value Date   WBC 9.9 08/08/2011   HGB 12.1 08/08/2011   HCT 39.5 08/08/2011   PLT 317 08/08/2011   GLUCOSE 138* 01/14/2013   CHOL 159 10/14/2012   TRIG 84.0 10/14/2012   HDL 50.20 10/14/2012   LDLCALC 92 10/14/2012   ALT 12 08/08/2011   AST 11 08/08/2011   NA 138 01/14/2013   K 3.5 01/14/2013   CL 103 01/14/2013   CREATININE 0.8 01/14/2013   BUN 10 01/14/2013   CO2 30 01/14/2013   TSH 1.24 07/22/2011   HGBA1C 7.5* 01/14/2013   MICROALBUR 4.5* 10/14/2012      Assessment & Plan:   See problem list. Medications and labs reviewed today.  Dysuria, mild. Check urinalysis and treat if abnormal

## 2013-04-05 NOTE — Assessment & Plan Note (Signed)
Historically recurrent flares over winter and spring season-  changed Debra [name] to steroid only inhaler 03/2011 due to tremor side effects -but poorly controlled symptoms spring 2013 Changed to advair 07/2011 - continue same with prn Alb Doing well at this time, continue same

## 2013-04-05 NOTE — Progress Notes (Signed)
Pre-visit discussion using our clinic review tool. No additional management support is needed unless otherwise documented below in the visit note.  

## 2013-04-05 NOTE — Assessment & Plan Note (Signed)
"  diet controlled", off all meds following lap band 09/2010  Then metformin resumed 12/2011 due to a1c>7 -  solo metformin stopped due to GI side effects so started Januvia 06/2012 Brief trial glipizide 08/2012, then changed to metformin combo with [place] Check a1c now, titrate as needed pt will call if cbg>250 or <100  Lab Results  Component Value Date   HGBA1C 7.5* 01/14/2013

## 2013-04-05 NOTE — Patient Instructions (Addendum)
It was good to see you today.  We have reviewed your prior records including labs and tests today  Test(s) ordered today. Your results will be released to MyChart (or called to you) after review, usually within 72hours after test completion. If any changes need to be made, you will be notified at that same time.  Medications reviewed and updated Stop hydrochlorothiazide 25 mg each a.m. Start amlodipine 5,g each day for blood pressure  no other changes recommended at this time.  Your prescription(s) have been submitted to your pharmacy. Please take as directed and contact our office if you believe you are having problem(s) with the medication(s).  Please schedule followup in 4 months for diabetes mellitus and weight check, call sooner if problems.  Diabetes and Standards of Medical Care  Diabetes is complicated. You may find that your diabetes team includes a dietitian, nurse, diabetes educator, eye doctor, and more. To help everyone know what is going on and to help you get the care you deserve, the following schedule of care was developed to help keep you on track. Below are the tests, exams, vaccines, medicines, education, and plans you will need. HbA1c test This test shows how well you have controlled your glucose over the past 2 3 months. It is used to see if your diabetes management plan needs to be adjusted.   It is performed at least 2 times a year if you are meeting treatment goals.  It is performed 4 times a year if therapy has changed or if you are not meeting treatment goals. Blood pressure test  This test is performed at every routine medical visit. The goal is less than 140/90 mmHg for most people, but 130/80 mmHg in some cases. Ask your health care provider about your goal. Dental exam  Follow up with the dentist regularly. Eye exam  If you are diagnosed with type 1 diabetes as a child, get an exam upon reaching the age of 10 years or older and have had diabetes for 3 5  years. Yearly eye exams are recommended after that initial eye exam.  If you are diagnosed with type 1 diabetes as an adult, get an exam within 5 years of diagnosis and then yearly.  If you are diagnosed with type 2 diabetes, get an exam as soon as possible after the diagnosis and then yearly. Foot care exam  Visual foot exams are performed at every routine medical visit. The exams check for cuts, injuries, or other problems with the feet.  A comprehensive foot exam should be done yearly. This includes visual inspection as well as assessing foot pulses and testing for loss of sensation.  Check your feet nightly for cuts, injuries, or other problems with your feet. Tell your health care provider if anything is not healing. Kidney function test (urine microalbumin)  This test is performed once a year.  Type 1 diabetes: The first test is performed 5 years after diagnosis.  Type 2 diabetes: The first test is performed at the time of diagnosis.  A serum creatinine and estimated glomerular filtration rate (eGFR) test is done once a year to assess the level of chronic kidney disease (CKD), if present. Lipid profile (cholesterol, HDL, LDL, triglycerides)  Performed every 5 years for most people.  The goal for LDL is less than 100 mg/dL. If you are at high risk, the goal is less than 70 mg/dL.  The goal for HDL is 40 mg/dL 50 mg/dL for men and 50 mg/dL 60 mg/dL  for women. An HDL cholesterol of 60 mg/dL or higher gives some protection against heart disease.  The goal for triglycerides is less than 150 mg/dL. Influenza vaccine, pneumococcal vaccine, and hepatitis B vaccine  The influenza vaccine is recommended yearly.  The pneumococcal vaccine is generally given once in a lifetime. However, there are some instances when another vaccination is recommended. Check with your health care provider.  The hepatitis B vaccine is also recommended for adults with diabetes. Diabetes self-management  education  Education is recommended at diagnosis and ongoing as needed. Treatment plan  Your treatment plan is reviewed at every medical visit. Document Released: 02/03/2009 Document Revised: 12/09/2012 Document Reviewed: 09/08/2012 [facility] Patient Information 2014 [place], [place].

## 2013-04-05 NOTE — Assessment & Plan Note (Signed)
strong FH same started ARB early 08/2011 added HCTZ late 08/2011 - changed to combo 12/2011, but pt stopped same due to dislike diuretic Resumed HCTZ 25 qAM 12/2012, but frequent noncompliance with same add amlodipine now, titrate as needed  BP Readings from Last 3 Encounters:  04/05/13 142/88  01/14/13 140/82  01/04/13 140/80

## 2013-07-08 LAB — HM PAP SMEAR

## 2013-07-22 ENCOUNTER — Other Ambulatory Visit (INDEPENDENT_AMBULATORY_CARE_PROVIDER_SITE_OTHER): Payer: BC Managed Care – PPO

## 2013-07-22 ENCOUNTER — Encounter: Payer: Self-pay | Admitting: Internal Medicine

## 2013-07-22 ENCOUNTER — Ambulatory Visit (INDEPENDENT_AMBULATORY_CARE_PROVIDER_SITE_OTHER): Payer: BC Managed Care – PPO | Admitting: Internal Medicine

## 2013-07-22 VITALS — BP 132/84 | HR 62 | Temp 99.9°F | Wt 331.4 lb

## 2013-07-22 DIAGNOSIS — Z0000 Encounter for general adult medical examination without abnormal findings: Secondary | ICD-10-CM

## 2013-07-22 DIAGNOSIS — E1165 Type 2 diabetes mellitus with hyperglycemia: Secondary | ICD-10-CM

## 2013-07-22 DIAGNOSIS — M25512 Pain in left shoulder: Secondary | ICD-10-CM

## 2013-07-22 DIAGNOSIS — IMO0001 Reserved for inherently not codable concepts without codable children: Secondary | ICD-10-CM

## 2013-07-22 DIAGNOSIS — M25519 Pain in unspecified shoulder: Secondary | ICD-10-CM

## 2013-07-22 LAB — HEPATIC FUNCTION PANEL
ALT: 14 U/L (ref 0–35)
AST: 14 U/L (ref 0–37)
Albumin: 3.4 g/dL — ABNORMAL LOW (ref 3.5–5.2)
Alkaline Phosphatase: 74 U/L (ref 39–117)
Bilirubin, Direct: 0 mg/dL (ref 0.0–0.3)
Total Bilirubin: 0.3 mg/dL (ref 0.3–1.2)
Total Protein: 6.6 g/dL (ref 6.0–8.3)

## 2013-07-22 LAB — CBC WITH DIFFERENTIAL/PLATELET
Basophils Absolute: 0 K/uL (ref 0.0–0.1)
Basophils Relative: 0.3 % (ref 0.0–3.0)
Eosinophils Absolute: 0.2 K/uL (ref 0.0–0.7)
Eosinophils Relative: 2.1 % (ref 0.0–5.0)
HCT: 36.5 % (ref 36.0–46.0)
Hemoglobin: 11.5 g/dL — ABNORMAL LOW (ref 12.0–15.0)
Lymphocytes Relative: 23.8 % (ref 12.0–46.0)
Lymphs Abs: 2.2 K/uL (ref 0.7–4.0)
MCHC: 31.6 g/dL (ref 30.0–36.0)
MCV: 78.2 fl (ref 78.0–100.0)
Monocytes Absolute: 0.5 K/uL (ref 0.1–1.0)
Monocytes Relative: 5.5 % (ref 3.0–12.0)
Neutro Abs: 6.4 K/uL (ref 1.4–7.7)
Neutrophils Relative %: 68.3 % (ref 43.0–77.0)
Platelets: 307 K/uL (ref 150.0–400.0)
RBC: 4.66 Mil/uL (ref 3.87–5.11)
RDW: 15.6 % — ABNORMAL HIGH (ref 11.5–14.6)
WBC: 9.4 K/uL (ref 4.5–10.5)

## 2013-07-22 LAB — BASIC METABOLIC PANEL
BUN: 10 mg/dL (ref 6–23)
CHLORIDE: 104 meq/L (ref 96–112)
CO2: 27 meq/L (ref 19–32)
Calcium: 8.8 mg/dL (ref 8.4–10.5)
Creatinine, Ser: 0.8 mg/dL (ref 0.4–1.2)
GFR: 101.11 mL/min (ref >60.00)
Glucose, Bld: 181 mg/dL — ABNORMAL HIGH (ref 70–99)
POTASSIUM: 4 meq/L (ref 3.5–5.1)
SODIUM: 139 meq/L (ref 135–145)

## 2013-07-22 LAB — HEMOGLOBIN A1C: Hgb A1c MFr Bld: 7.7 % — ABNORMAL HIGH (ref 4.6–6.5)

## 2013-07-22 LAB — MICROALBUMIN / CREATININE URINE RATIO
Creatinine,U: 96.7 mg/dL
MICROALB UR: 0.1 mg/dL (ref 0.0–1.9)
Microalb Creat Ratio: 0.1 mg/g (ref 0.0–30.0)

## 2013-07-22 LAB — TSH: TSH: 1.66 u[IU]/mL (ref 0.35–5.50)

## 2013-07-22 MED ORDER — SITAGLIPTIN PHOSPHATE 100 MG PO TABS: 100.0000 mg | ORAL_TABLET | Freq: Every day | ORAL | Status: DC

## 2013-07-22 NOTE — Progress Notes (Signed)
Pre visit review using our clinic review tool, if applicable. No additional management support is needed unless otherwise documented below in the visit note. 

## 2013-07-22 NOTE — Patient Instructions (Signed)
It was good to see you today.  We have reviewed your prior records including labs and tests today  Health Maintenance reviewed - all recommended immunizations and age-appropriate screenings are up-to-date.  Test(s) ordered today. Your results will be released to MyChart (or called to you) after review, usually within 72hours after test completion. If any changes need to be made, you will be notified at that same time.  we'll make referral to physical therapy for your shoulder . Our office will contact you regarding appointment(s) once made.  Medications reviewed and updated, no changes recommended at this time.  Please schedule followup in 4 months for diabetes and cholesterol labs, call sooner if problems.

## 2013-07-22 NOTE — Assessment & Plan Note (Signed)
"  diet controlled", off all meds following lap band 09/2010  Then metformin resumed 12/2011 due to a1c>7 -  solo metformin stopped due to GI side effects so started Januvia 06/2012 Brief trial glipizide 08/2012, then changed to metformin combo with [place], but remains on only januvia Check a1c now, titrate as needed pt will call if cbg>250 or <100  Lab Results  Component Value Date   HGBA1C 7.1* 04/05/2013

## 2013-07-22 NOTE — Progress Notes (Signed)
Subjective:    Patient ID: Debra [name], female    DOB: 06/28/1975, 38 y.o.   MRN: [id number]  HPI  patient is here today for annual physical. Patient feels well and has no complaints.  Also reviewed chronic medical issues and interval medical events  Past Medical History  Diagnosis Date  . Diabetes mellitus type II 08/2009 dx  . Morbid obesity     s/p lab band 09/2010  . Knee pain, right   . Asthma   . Allergic rhinitis, cause unspecified   . Ovarian cyst     right s/p resection June 2013  . Hypertension   . Endometriosis    Family History  Problem Relation Age of Onset  . Heart disease Mother   . Hypertension Mother   . Stroke Mother   . Diabetes Father    History  Substance Use Topics  . Smoking status: Never Smoker   . Smokeless tobacco: Not on file  . Alcohol Use: No    Review of Systems  Constitutional: Negative for fatigue and unexpected weight change.  Respiratory: Negative for cough, shortness of breath and wheezing.   Cardiovascular: Negative for chest pain, palpitations and leg swelling.  Gastrointestinal: Negative for nausea, abdominal pain and diarrhea.  Musculoskeletal: Positive for arthralgias (L shoulder x 2-3 weeks, ?overuse).  Neurological: Negative for dizziness, weakness, light-headedness and headaches.  Psychiatric/Behavioral: Negative for dysphoric mood. The patient is not nervous/anxious.   All other systems reviewed and are negative.       Objective:   Physical Exam  BP 132/84  Pulse 62  Temp(Src) 99.9 F (37.7 C) (Oral)  Wt 331 lb 6.4 oz (150.322 kg)  SpO2 98%  LMP 07/28/2011 Wt Readings from Last 3 Encounters:  07/22/13 331 lb 6.4 oz (150.322 kg)  04/05/13 331 lb 1.9 oz (150.195 kg)  01/14/13 328 lb 1.9 oz (148.834 kg)   Constitutional: She is MO, but appears well-developed and well-nourished. No distress.  HENT: Head: Normocephalic and atraumatic. Ears: B TMs ok, no erythema or effusion; Nose: Nose normal. Mouth/Throat:  Oropharynx is clear and moist. No oropharyngeal exudate.  Eyes: Conjunctivae and EOM are normal. Pupils are equal, round, and reactive to light. No scleral icterus.  Neck: Normal range of motion. Neck supple. No JVD present. No thyromegaly present.  Cardiovascular: Normal rate, regular rhythm and normal heart sounds.  No murmur heard. No BLE edema. Pulmonary/Chest: Effort normal and breath sounds normal. No respiratory distress. She has no wheezes.  Abdominal: Soft. Bowel sounds are normal. She exhibits no distension. There is no tenderness. no masses Musculoskeletal: L decreased range of motion on forward flexion, abduction, and internal rotation. Positive impingement signs. good strength with stressing of rotator cuff but painful. Pain with crossed arm adduction. referred pain into distal deltoid. Tender over a.c. joint and subacromial. Neurological: She is alert and oriented to person, place, and time. No cranial nerve deficit. Coordination, balance, strength, speech and gait are normal.  Skin: Skin is warm and dry. No rash noted. No erythema.  Psychiatric: She has a normal mood and affect. Her behavior is normal. Judgment and thought content normal.    Lab Results  Component Value Date   WBC 9.9 08/08/2011   HGB 12.1 08/08/2011   HCT 39.5 08/08/2011   PLT 317 08/08/2011   GLUCOSE 138* 01/14/2013   CHOL 159 10/14/2012   TRIG 84.0 10/14/2012   HDL 50.20 10/14/2012   LDLCALC 92 10/14/2012   ALT 12 08/08/2011   AST  11 08/08/2011   NA 138 01/14/2013   K 3.5 01/14/2013   CL 103 01/14/2013   CREATININE 0.8 01/14/2013   BUN 10 01/14/2013   CO2 30 01/14/2013   TSH 1.24 07/22/2011   HGBA1C 7.1* 04/05/2013   MICROALBUR 4.5* 10/14/2012   No results found     Assessment & Plan:   CPX/v70.0 - Patient has been counseled on age-appropriate routine health concerns for screening and prevention. These are reviewed and up-to-date. Immunizations are up-to-date or declined. Labs ordered and reviewed.  L shoulder  pain x 3 weeks - improved with ibuprofen 800 prn but recurrent "ache" - suspect RTC tendonitis from overuse - continue NSAIDs and refer to PT - declines need for injection  Problem List Items Addressed This Visit   Type II or unspecified type diabetes mellitus without mention of complication, uncontrolled      "diet controlled", off all meds following lap band 09/2010  Then metformin resumed 12/2011 due to a1c>7 -  solo metformin stopped due to GI side effects so started Januvia 06/2012 Brief trial glipizide 08/2012, then changed to metformin combo with [place], but remains on only januvia Check a1c now, titrate as needed pt will call if cbg>250 or <100  Lab Results  Component Value Date   HGBA1C 7.1* 04/05/2013       Other Visit Diagnoses   Routine general medical examination at a health care facility    -  Primary    Left shoulder pain

## 2013-08-04 ENCOUNTER — Ambulatory Visit: Payer: BC Managed Care – PPO | Admitting: Internal Medicine

## 2013-10-13 ENCOUNTER — Ambulatory Visit (INDEPENDENT_AMBULATORY_CARE_PROVIDER_SITE_OTHER): Payer: BC Managed Care – PPO | Admitting: Ophthalmology

## 2013-10-19 ENCOUNTER — Telehealth: Payer: Self-pay | Admitting: *Deleted

## 2013-10-19 DIAGNOSIS — E1165 Type 2 diabetes mellitus with hyperglycemia: Principal | ICD-10-CM

## 2013-10-19 DIAGNOSIS — IMO0001 Reserved for inherently not codable concepts without codable children: Secondary | ICD-10-CM

## 2013-10-19 NOTE — Telephone Encounter (Signed)
Lipid panel ordered.

## 2013-10-19 NOTE — Telephone Encounter (Signed)
Left message on machine for patient to return our call 

## 2013-10-20 ENCOUNTER — Encounter: Payer: Self-pay | Admitting: *Deleted

## 2013-10-20 NOTE — Telephone Encounter (Signed)
Message sent to mychart

## 2013-10-28 ENCOUNTER — Other Ambulatory Visit: Payer: Self-pay | Admitting: Internal Medicine

## 2013-10-28 ENCOUNTER — Other Ambulatory Visit: Payer: Self-pay | Admitting: General Practice

## 2013-10-28 MED ORDER — GLUCOSE BLOOD VI STRP: ORAL_STRIP | Status: DC

## 2013-11-03 DIAGNOSIS — N809 Endometriosis, unspecified: Secondary | ICD-10-CM | POA: Insufficient documentation

## 2013-11-10 ENCOUNTER — Other Ambulatory Visit (INDEPENDENT_AMBULATORY_CARE_PROVIDER_SITE_OTHER): Payer: BC Managed Care – PPO

## 2013-11-10 DIAGNOSIS — IMO0001 Reserved for inherently not codable concepts without codable children: Secondary | ICD-10-CM

## 2013-11-10 DIAGNOSIS — E1165 Type 2 diabetes mellitus with hyperglycemia: Principal | ICD-10-CM

## 2013-11-10 LAB — LIPID PANEL
CHOL/HDL RATIO: 4
CHOLESTEROL: 141 mg/dL (ref 0–200)
HDL: 33.7 mg/dL — ABNORMAL LOW (ref >39.00)
LDL CALC: 99 mg/dL (ref 0–99)
NonHDL: 107.3
TRIGLYCERIDES: 44 mg/dL (ref 0.0–149.0)
VLDL: 8.8 mg/dL (ref 0.0–40.0)

## 2013-11-11 ENCOUNTER — Ambulatory Visit (INDEPENDENT_AMBULATORY_CARE_PROVIDER_SITE_OTHER): Payer: BC Managed Care – PPO | Admitting: Ophthalmology

## 2013-11-22 ENCOUNTER — Telehealth: Payer: Self-pay | Admitting: Internal Medicine

## 2013-11-22 ENCOUNTER — Encounter: Payer: Self-pay | Admitting: Family

## 2013-11-22 ENCOUNTER — Ambulatory Visit (INDEPENDENT_AMBULATORY_CARE_PROVIDER_SITE_OTHER): Payer: BC Managed Care – PPO | Admitting: Family

## 2013-11-22 VITALS — BP 140/84 | HR 54 | Temp 98.5°F | Wt 323.2 lb

## 2013-11-22 DIAGNOSIS — I10 Essential (primary) hypertension: Secondary | ICD-10-CM

## 2013-11-22 DIAGNOSIS — J309 Allergic rhinitis, unspecified: Secondary | ICD-10-CM

## 2013-11-22 MED ORDER — METHYLPREDNISOLONE ACETATE 40 MG/ML IJ SUSP
80.0000 mg | Freq: Once | INTRAMUSCULAR | Status: AC
  Administered 2013-11-22: 80 mg via INTRAMUSCULAR

## 2013-11-22 NOTE — Patient Instructions (Signed)
Hay Fever Hay fever is an allergic reaction to particles in the air. It cannot be passed from person to person. It cannot be cured, but it can be controlled. CAUSES  Hay fever is caused by something that triggers an allergic reaction (allergens). The following are examples of allergens:  Ragweed.  Feathers.  Animal dander.  Grass and tree pollens.  Cigarette smoke.  House dust.  Pollution. SYMPTOMS   Sneezing.  Runny or stuffy nose.  Tearing eyes.  Itchy eyes, nose, mouth, throat, skin, or other area.  Sore throat.  Headache.  Decreased sense of smell or taste. DIAGNOSIS Your caregiver will perform a physical exam and ask questions about the symptoms you are having.Allergy testing may be done to determine exactly what triggers your hay fever.  TREATMENT   Over-the-counter medicines may help symptoms. These include:  Antihistamines.  Decongestants. These may help with nasal congestion.  Your caregiver may prescribe medicines if over-the-counter medicines do not work.  Some people benefit from allergy shots when other medicines are not helpful. HOME CARE INSTRUCTIONS   Avoid the allergen that is causing your symptoms, if possible.  Take all medicine as told by your caregiver. SEEK MEDICAL CARE IF:   You have severe allergy symptoms and your current medicines are not helping.  Your treatment was working at one time, but you are now experiencing symptoms.  You have sinus congestion and pressure.  You develop a fever or headache.  You have thick nasal discharge.  You have asthma and have a worsening cough and wheezing. SEEK IMMEDIATE MEDICAL CARE IF:   You have swelling of your tongue or lips.  You have trouble breathing.  You feel lightheaded or like you are going to faint.  You have cold sweats.  You have a fever. Document Released: 04/08/2005 Document Revised: 07/01/2011 Document Reviewed: 07/04/2010 Exit[employer] Patient Information 2015  [employer]. This information is not intended to replace advice given to you by your health care provider. Make sure you discuss any questions you have with your health care provider.  

## 2013-11-22 NOTE — Progress Notes (Signed)
Subjective:    Patient ID: Debra [name], female    DOB: 04-19-1976, 38 y.o.   MRN: [id number]  HPI  38 year old [place] American female, nonsmoker with a history of asthma and allergies in today with cough, congestion, runny nose, sneezing, eyes matting x1 week and worsening. Reports being at the beach last week. Takes Allegra daily. Nasonex as needed but has not been taking it recently. Also discontinued taking Singulair.  Review of Systems  Constitutional: Negative.  Negative for fever.  HENT: Positive for congestion, postnasal drip, rhinorrhea, sneezing and sore throat.   Respiratory: Positive for cough. Negative for wheezing.   Gastrointestinal: Negative.   Endocrine: Negative.   Musculoskeletal: Negative.   Skin: Negative.   Allergic/Immunologic: Positive for environmental allergies. Negative for food allergies and immunocompromised state.  Psychiatric/Behavioral: Negative.   All other systems reviewed and are negative.  Past Medical History  Diagnosis Date  . Diabetes mellitus type II 08/2009 dx  . Morbid obesity     s/p lab band 09/2010  . Knee pain, right   . Asthma   . Allergic rhinitis, cause unspecified   . Ovarian cyst     right s/p resection June 2013  . Hypertension   . Endometriosis     History   Social History  . Marital Status: Single    Spouse Name: N/A    Number of Children: N/A  . Years of Education: N/A   Occupational History  . Not on file.   Social History Main Topics  . Smoking status: Never Smoker   . Smokeless tobacco: Not on file  . Alcohol Use: No  . Drug Use: No  . Sexual Activity: Not on file   Other Topics Concern  . Not on file   Social History Narrative   Single, lives with mom. Employed as school driver    Past Surgical History  Procedure Laterality Date  . Laparoscopic gastric banding  10/02/2010    start weight 349#  . Salpingoophorectomy  10/08/11    right  . Abdominal hyste[place]  05/14/12    [place]     Family History  Problem Relation Age of Onset  . Heart disease Mother   . Hypertension Mother   . Stroke Mother   . Diabetes Father     Allergies  Allergen Reactions  . Influenza Vaccines     Current Outpatient Prescriptions on File Prior to Visit  Medication Sig Dispense Refill  . albuterol (PROAIR HFA) 108 (90 BASE) MCG/ACT inhaler Inhale 2 puffs into the lungs every 4 (four) hours as needed.  18 g  2  . fexofenadine (ALLEGRA) 180 MG tablet Take 180 mg by mouth daily as needed. For seasonal allergies      . Fluticasone-Salmeterol (ADVAIR DISKUS) 250-50 MCG/DOSE AEPB Inhale 1 puff into the lungs 2 (two) times daily.  1 each  3  . glucose blood (ONE TOUCH ULTRA TEST) test strip Use as directed  100 each  2  . guaiFENesin (MUCINEX) 600 MG 12 hr tablet Take 2 tablets (1,200 mg total) by mouth 2 (two) times daily.  30 tablet  0  . ibuprofen (ADVIL,MOTRIN) 800 MG tablet TAKE 1 TABLET BY MOUTH EVERY 8 HOURS AS NEEDED FOR PAIN  30 tablet  2  . losartan (COZAAR) 100 MG tablet Take 1 tablet (100 mg total) by mouth daily.  90 tablet  3  . mometasone (NASONEX) 50 MCG/ACT nasal spray Place 2 sprays into the nose daily as needed.  For nasal spray      . norethindrone (AYGESTIN) 5 MG tablet Take 1 by mouth daily      . olopatadine (PATANOL) 0.1 % ophthalmic solution Place 1 drop into both eyes 2 (two) times daily.  5 mL  12  . sitaGLIPtin (JANUVIA) 100 MG tablet Take 1 tablet (100 mg total) by mouth daily.  30 tablet  5  . montelukast (SINGULAIR) 10 MG tablet Take 1 tablet (10 mg total) by mouth at bedtime.  30 tablet  11   No current facility-administered medications on file prior to visit.    BP 140/84  Pulse 54  Temp(Src) 98.5 F (36.9 C) (Oral)  Wt 323 lb 3.2 oz (146.603 kg)  LMP 04/07/2013chart    Objective:   Physical Exam  Constitutional: She is oriented to person, place, and time. She appears well-developed and well-nourished.  HENT:  Right Ear: External ear normal.  Left  Ear: External ear normal.  Mouth/Throat: Oropharynx is clear and moist.  Nasal turbinates are red and inflamed   Neck: Normal range of motion.  Cardiovascular: Normal rate, regular rhythm and normal heart sounds.   Pulmonary/Chest: Effort normal and breath sounds normal.  Musculoskeletal: Normal range of motion.  Neurological: She is alert and oriented to person, place, and time.  Skin: Skin is warm and dry.  Psychiatric: She has a normal mood and affect.          Assessment & Plan:  Debra Shileyiffany was seen today for allergic rhinitis .  Diagnoses and associated orders for this visit:  Allergic rhinitis, unspecified allergic rhinitis type - methylPREDNISolone acetate (DEPO-MEDROL) injection 80 mg; Inject 2 mLs (80 mg total) into the muscle once.  Unspecified essential hypertension  Morbid obesity   Call the office with any questions or concerns. Recheck as scheduled and as needed

## 2013-11-22 NOTE — Telephone Encounter (Signed)
Caller: Jerusalen/Patient; PCP: Rene PaciLeschber, Valerie (Adults only); CB#: [phone]; Call regarding "Cough/Congestion."  States onset of sore throat, cough 11/15/13 but has not improved.  Blood sugars normal for her per patient.  Mucus is discolored.  Afebrile.  Per diabetes: respiratory protocol, advised appt within 4 hours due to "productive cough with colored sputum."  [facility] within dispositioned time frame; [facility] 11/22/13 1115 with Ms. Orvan Falconerampbell.  Demaris Callanderkrs/can

## 2013-11-22 NOTE — Progress Notes (Signed)
Pre visit review using our clinic review tool, if applicable. No additional management support is needed unless otherwise documented below in the visit note. 

## 2013-11-23 ENCOUNTER — Other Ambulatory Visit: Payer: Self-pay | Admitting: *Deleted

## 2013-11-23 ENCOUNTER — Telehealth: Payer: Self-pay | Admitting: Internal Medicine

## 2013-11-23 MED ORDER — BENZONATATE 100 MG PO CAPS: 100.0000 mg | ORAL_CAPSULE | Freq: Three times a day (TID) | ORAL | Status: DC | PRN

## 2013-11-23 NOTE — Telephone Encounter (Signed)
Relevant patient education assigned to patient using Emmi. ° °

## 2013-11-23 NOTE — Telephone Encounter (Signed)
Left msg on triage stating [facility] yesterday had a asthma attack fail to ask for a refill on tessalon pearle for her cough. Called pt back inform her will send to her pharmacy...Raechel Chute/lmb

## 2013-11-25 ENCOUNTER — Other Ambulatory Visit (INDEPENDENT_AMBULATORY_CARE_PROVIDER_SITE_OTHER): Payer: BC Managed Care – PPO

## 2013-11-25 ENCOUNTER — Ambulatory Visit (INDEPENDENT_AMBULATORY_CARE_PROVIDER_SITE_OTHER): Payer: BC Managed Care – PPO | Admitting: Internal Medicine

## 2013-11-25 ENCOUNTER — Encounter: Payer: Self-pay | Admitting: Internal Medicine

## 2013-11-25 VITALS — BP 134/72 | HR 56 | Temp 98.9°F | Ht 67.0 in | Wt 322.2 lb

## 2013-11-25 DIAGNOSIS — IMO0001 Reserved for inherently not codable concepts without codable children: Secondary | ICD-10-CM

## 2013-11-25 DIAGNOSIS — E1165 Type 2 diabetes mellitus with hyperglycemia: Secondary | ICD-10-CM

## 2013-11-25 DIAGNOSIS — J45901 Unspecified asthma with (acute) exacerbation: Secondary | ICD-10-CM

## 2013-11-25 DIAGNOSIS — J45909 Unspecified asthma, uncomplicated: Secondary | ICD-10-CM

## 2013-11-25 DIAGNOSIS — J4541 Moderate persistent asthma with (acute) exacerbation: Secondary | ICD-10-CM

## 2013-11-25 DIAGNOSIS — I10 Essential (primary) hypertension: Secondary | ICD-10-CM

## 2013-11-25 LAB — HEMOGLOBIN A1C: Hgb A1c MFr Bld: 8.3 % — ABNORMAL HIGH (ref 4.6–6.5)

## 2013-11-25 MED ORDER — AZITHROMYCIN 250 MG PO TABS: ORAL_TABLET | ORAL | Status: DC

## 2013-11-25 MED ORDER — PREDNISONE (PAK) 10 MG PO TABS: ORAL_TABLET | ORAL | Status: DC

## 2013-11-25 NOTE — Progress Notes (Signed)
Pre visit review using our clinic review tool, if applicable. No additional management support is needed unless otherwise documented below in the visit note. 

## 2013-11-25 NOTE — Assessment & Plan Note (Signed)
Working on weight loss - doing well thus far! S/p Lap band 10/02/10 - start weight 349# encouragement provided  Wt Readings from Last 3 Encounters:  11/25/13 322 lb 4 oz (146.172 kg)  11/22/13 323 lb 3.2 oz (146.603 kg)  07/22/13 331 lb 6.4 oz (150.322 kg)

## 2013-11-25 NOTE — Patient Instructions (Signed)
It was good to see you today.  We have reviewed your prior records including labs and tests today  Test(s) ordered today. Your results will be released to MyChart (or called to you) after review, usually within 72hours after test completion. If any changes need to be made, you will be notified at that same time.  Medications reviewed and updated - see changes below  Please schedule followup in 6 months, call sooner if problems.

## 2013-11-25 NOTE — Progress Notes (Signed)
Subjective:    Patient ID: Debra [name], female    DOB: 01/10/1976, 38 y.o.   MRN: [id number]  HPI  Patient is here for follow up  Reviewed chronic medical issues and interval medical events  Past Medical History  Diagnosis Date  . Diabetes mellitus type II 08/2009 dx  . Morbid obesity     s/p lab band 09/2010  . Knee pain, right   . Asthma   . Allergic rhinitis, cause unspecified   . Ovarian cyst     right s/p resection June 2013  . Hypertension   . Endometriosis     Review of Systems  Constitutional: Positive for fatigue. Negative for fever and unexpected weight change.  HENT: Positive for postnasal drip, rhinorrhea, sinus pressure, sneezing and sore throat. Negative for ear discharge, ear pain, facial swelling, hearing loss, tinnitus and trouble swallowing.   Respiratory: Positive for cough, shortness of breath and wheezing.        Improved in past 48h since depomedrol IM for AR sx  Cardiovascular: Negative for chest pain and leg swelling.  Neurological: Negative for weakness and headaches.       Objective:   Physical Exam  BP 134/72  Pulse 56  Temp(Src) 98.9 F (37.2 C) (Oral)  Ht 5\' 7"  (1.702 m)  Wt 322 lb 4 oz (146.172 kg)  BMI 50.46 kg/m2  SpO2 99%  LMP 07/28/2011 Wt Readings from Last 3 Encounters:  11/25/13 322 lb 4 oz (146.172 kg)  11/22/13 323 lb 3.2 oz (146.603 kg)  07/22/13 331 lb 6.4 oz (150.322 kg)   Constitutional: She is MO, appears well-developed and well-nourished. Hoarse and dry cough spells but no distress.  HENT: NCAT, sinus nontender. R TM hazy but without erythema OP red but no exudate or vesicles  Neck: Thick. Normal range of motion. Neck supple. No JVD present. No thyromegaly present.  Cardiovascular: Normal rate, regular rhythm and normal heart sounds.  No murmur heard. No BLE edema. Pulmonary/Chest: Effort normal and breath sounds diminished at bases. No respiratory distress. She has mild exp wheezes.  Psychiatric: She has a normal  mood and affect. Her behavior is normal. Judgment and thought content normal.   Lab Results  Component Value Date   WBC 9.4 07/22/2013   HGB 11.5* 07/22/2013   HCT 36.5 07/22/2013   PLT 307.0 07/22/2013   GLUCOSE 181* 07/22/2013   CHOL 141 11/10/2013   TRIG 44.0 11/10/2013   HDL 33.70* 11/10/2013   LDLCALC 99 11/10/2013   ALT 14 07/22/2013   AST 14 07/22/2013   NA 139 07/22/2013   K 4.0 07/22/2013   CL 104 07/22/2013   CREATININE 0.8 07/22/2013   BUN 10 07/22/2013   CO2 27 07/22/2013   TSH 1.66 07/22/2013   HGBA1C 7.7* 07/22/2013   MICROALBUR 0.1 07/22/2013    No results found.     Assessment & Plan:   Problem List Items Addressed This Visit   ASTHMA     Mild acute exaxcerbation in past 7-10 days, improved following IM mderol 48h ago, but not resolved Rx zpak and pred pak now - Historically recurrent flares over winter and spring season-  03/2011 changed Dulera to steroid only inhaler (due to tremor side effects) but poorly controlled symptoms spring 2013 Changed to advair 07/2011 - continue same with prn Alb Consider allergy eval if persisting daily symptoms with aggressive tx of acute flare in past 2 weeks    Relevant Medications      predniSONE (  STERAPRED UNI-PAK) 10 MG tablet   Hypertension      strong FH same started ARB early 08/2011 added HCTZ late 08/2011 - changed to combo 12/2011, but pt stopped same due to dislike diuretic Resumed HCTZ 25 qAM 12/2012, but frequent noncompliance with same  BP Readings from Last 3 Encounters:  11/25/13 134/72  11/22/13 140/84  07/22/13 132/84      Morbid obesity      Working on weight loss - doing well thus far! S/p Lap band 10/02/10 - start weight 349# encouragement provided  Wt Readings from Last 3 Encounters:  11/25/13 322 lb 4 oz (146.172 kg)  11/22/13 323 lb 3.2 oz (146.603 kg)  07/22/13 331 lb 6.4 oz (150.322 kg)      Type II or unspecified type diabetes mellitus without mention of complication, uncontrolled      "diet controlled" until  12/2011, off all meds following lap band 09/2010  Then metformin resumed 12/2011 due to a1c>7   solo metformin stopped due to GI side effects so started Januvia 06/2012 Brief trial glipizide 08/2012 Check a1c now, titrate as needed pt will call if cbg>250 or <100  Lab Results  Component Value Date   HGBA1C 7.7* 07/22/2013      Relevant Orders      Hemoglobin A1c (Completed)    Other Visit Diagnoses   Asthma with acute exacerbation, moderate persistent    -  Primary    Relevant Medications       predniSONE (STERAPRED UNI-PAK) 10 MG tablet

## 2013-11-25 NOTE — Assessment & Plan Note (Signed)
"  diet controlled" until 12/2011, off all meds following lap band 09/2010  Then metformin resumed 12/2011 due to a1c>7   solo metformin stopped due to GI side effects so started Januvia 06/2012 Brief trial glipizide 08/2012 Check a1c now, titrate as needed pt will call if cbg>250 or <100  Lab Results  Component Value Date   HGBA1C 7.7* 07/22/2013

## 2013-11-25 NOTE — Assessment & Plan Note (Signed)
strong FH same started ARB early 08/2011 added HCTZ late 08/2011 - changed to combo 12/2011, but pt stopped same due to dislike diuretic Resumed HCTZ 25 qAM 12/2012, but frequent noncompliance with same  BP Readings from Last 3 Encounters:  11/25/13 134/72  11/22/13 140/84  07/22/13 132/84

## 2013-11-25 NOTE — Assessment & Plan Note (Signed)
Mild acute exaxcerbation in past 7-10 days, improved following IM mderol 48h ago, but not resolved Rx zpak and pred pak now - Historically recurrent flares over winter and spring season-  03/2011 changed Dulera to steroid only inhaler (due to tremor side effects) but poorly controlled symptoms spring 2013 Changed to advair 07/2011 - continue same with prn Alb Consider allergy eval if persisting daily symptoms with aggressive tx of acute flare in past 2 weeks

## 2013-12-07 ENCOUNTER — Other Ambulatory Visit: Payer: Self-pay | Admitting: Internal Medicine

## 2014-02-22 ENCOUNTER — Encounter: Payer: Self-pay | Admitting: Internal Medicine

## 2014-02-22 ENCOUNTER — Ambulatory Visit (INDEPENDENT_AMBULATORY_CARE_PROVIDER_SITE_OTHER): Payer: BC Managed Care – PPO | Admitting: Internal Medicine

## 2014-02-22 VITALS — BP 126/80 | HR 70 | Temp 97.8°F | Resp 16 | Ht 67.0 in | Wt 320.0 lb

## 2014-02-22 DIAGNOSIS — J0121 Acute recurrent ethmoidal sinusitis: Secondary | ICD-10-CM

## 2014-02-22 DIAGNOSIS — J302 Other seasonal allergic rhinitis: Secondary | ICD-10-CM

## 2014-02-22 MED ORDER — METHYLPREDNISOLONE ACETATE 80 MG/ML IJ SUSP
120.0000 mg | Freq: Once | INTRAMUSCULAR | Status: AC
  Administered 2014-02-22: 120 mg via INTRAMUSCULAR

## 2014-02-22 MED ORDER — CEFUROXIME AXETIL 500 MG PO TABS: 500.0000 mg | ORAL_TABLET | Freq: Two times a day (BID) | ORAL | Status: DC

## 2014-02-22 NOTE — Progress Notes (Signed)
Pre visit review using our clinic review tool, if applicable. No additional management support is needed unless otherwise documented below in the visit note. 

## 2014-02-22 NOTE — Assessment & Plan Note (Signed)
She is having a flare up today and has some eustachian tube dysfunction I gave her an injection of depo-medrol for symptoms relief She will cont all current meds as well

## 2014-02-22 NOTE — Progress Notes (Signed)
Subjective:    Patient ID: Debra [name], female    DOB: 12/20/1975, 38 y.o.   MRN: [id number]  Sinusitis This is a new problem. The current episode started in the past 7 days. The problem has been gradually worsening since onset. There has been no fever. The fever has been present for less than 1 day. Her pain is at a severity of 0/10. She is experiencing no pain. Associated symptoms include congestion, ear pain ("popping" in both ears), sinus pressure and sneezing. Pertinent negatives include no chills, coughing, diaphoresis, headaches, hoarse voice, neck pain, shortness of breath, sore throat or swollen glands. Past treatments include oral decongestants and spray decongestants. The treatment provided mild relief.      Review of Systems  Constitutional: Negative.  Negative for fever, chills, diaphoresis, appetite change and fatigue.  HENT: Positive for congestion, ear pain ("popping" in both ears), postnasal drip, rhinorrhea, sinus pressure and sneezing. Negative for facial swelling, hoarse voice, nosebleeds, sore throat and trouble swallowing.   Eyes: Negative.   Respiratory: Negative.  Negative for cough, choking, chest tightness and shortness of breath.   Cardiovascular: Negative.  Negative for chest pain, palpitations and leg swelling.  Gastrointestinal: Negative.  Negative for nausea, vomiting, abdominal pain, diarrhea, constipation and blood in stool.  Endocrine: Negative.   Genitourinary: Negative.   Musculoskeletal: Negative.  Negative for neck pain.  Skin: Negative.  Negative for rash.  Allergic/Immunologic: Negative.   Neurological: Negative.  Negative for headaches.  Hematological: Negative.  Negative for adenopathy. Does not bruise/bleed easily.  Psychiatric/Behavioral: Negative.        Objective:   Physical Exam  Constitutional: She is oriented to person, place, and time. She appears well-developed and well-nourished.  Non-toxic appearance. She does not have a sickly  appearance. She does not appear ill. No distress.  HENT:  Head: Normocephalic and atraumatic.  Right Ear: Hearing, tympanic membrane, external ear and ear canal normal.  Left Ear: Hearing, tympanic membrane, external ear and ear canal normal.  Nose: Mucosal edema and rhinorrhea present. No sinus tenderness, nasal deformity or septal deviation. No epistaxis. Right sinus exhibits maxillary sinus tenderness. Right sinus exhibits no frontal sinus tenderness. Left sinus exhibits no maxillary sinus tenderness and no frontal sinus tenderness.  Mouth/Throat: Oropharynx is clear and moist and mucous membranes are normal. Mucous membranes are not pale, not dry and not cyanotic. No trismus in the jaw. No uvula swelling. No oropharyngeal exudate, posterior oropharyngeal edema, posterior oropharyngeal erythema or tonsillar abscesses.  Eyes: Conjunctivae are normal. Right eye exhibits no discharge. Left eye exhibits no discharge. No scleral icterus.  Neck: Normal range of motion. Neck supple. No JVD present. No tracheal deviation present. No thyromegaly present.  Cardiovascular: Normal rate, regular rhythm, normal heart sounds and intact distal pulses.  Exam reveals no gallop and no friction rub.   No murmur heard. Pulmonary/Chest: Effort normal and breath sounds normal. No stridor. No respiratory distress. She has no wheezes. She has no rales. She exhibits no tenderness.  Abdominal: Soft. Bowel sounds are normal. She exhibits no distension and no mass. There is no tenderness. There is no rebound and no guarding.  Musculoskeletal: Normal range of motion. She exhibits no edema or tenderness.  Lymphadenopathy:    She has no cervical adenopathy.  Neurological: She is oriented to person, place, and time.  Skin: Skin is warm and dry. No rash noted. She is not diaphoretic. No erythema. No pallor.          Assessment &  Plan:

## 2014-02-22 NOTE — Patient Instructions (Signed)

## 2014-02-22 NOTE — Assessment & Plan Note (Signed)
Will treat the infection with ceftin 

## 2014-04-12 ENCOUNTER — Other Ambulatory Visit: Payer: Self-pay | Admitting: Internal Medicine

## 2014-04-20 ENCOUNTER — Ambulatory Visit (INDEPENDENT_AMBULATORY_CARE_PROVIDER_SITE_OTHER): Payer: BC Managed Care – PPO | Admitting: Physician Assistant

## 2014-04-20 VITALS — BP 220/110 | HR 64 | Temp 99.0°F | Resp 16 | Ht 67.0 in | Wt 319.0 lb

## 2014-04-20 DIAGNOSIS — I10 Essential (primary) hypertension: Secondary | ICD-10-CM

## 2014-04-20 DIAGNOSIS — J309 Allergic rhinitis, unspecified: Secondary | ICD-10-CM

## 2014-04-20 NOTE — Patient Instructions (Signed)
Take your medications! Take Afrin tonight, tomorrow morning and tomorrow night, and Friday morning if needed. I'll see you Friday morning to recheck!

## 2014-04-20 NOTE — Progress Notes (Signed)
Subjective:    Patient ID: Debra [name], female    DOB: 12/11/1975, 38 y.o.   MRN: [id number]   PCP: Rene PaciValerie Leschber, MD  Chief Complaint  Patient presents with  . Nasal Congestion    x 2 days    Allergies  Allergen Reactions  . Influenza Vaccines   . Morphine Hives    Patient Active Problem List   Diagnosis Date Noted  . Acute recurrent ethmoidal sinusitis 02/22/2014  . Endometriosis 11/03/2013  . Right knee pain   . Hypertension 08/26/2011  . Hx of laparoscopic gastric banding, 10/02/2010. 11/27/2010  . PERSONAL HISTORY OF ALLERGY TO OTHER FOODS 06/28/2010  . URTICARIA 05/24/2010  . Type II or unspecified type diabetes mellitus without mention of complication, uncontrolled 08/23/2009  . Morbid obesity 08/22/2009  . Allergic rhinitis 08/22/2009  . ASTHMA 08/22/2009  . ARTHRITIS 08/22/2009    Prior to Admission medications   Medication Sig Start Date End Date Taking? Authorizing Provider  albuterol (PROAIR HFA) 108 (90 BASE) MCG/ACT inhaler Inhale 2 puffs into the lungs every 4 (four) hours as needed. 05/13/12  Yes Newt LukesValerie A Leschber, MD  fexofenadine (ALLEGRA) 180 MG tablet Take 180 mg by mouth daily as needed. For seasonal allergies   Yes Historical Provider, MD  Fluticasone-Salmeterol (ADVAIR DISKUS) 250-50 MCG/DOSE AEPB Inhale 1 puff into the lungs 2 (two) times daily. 07/22/11  Yes Newt LukesValerie A Leschber, MD  glucose blood (ONE TOUCH ULTRA TEST) test strip Use as directed 10/28/13  Yes Newt LukesValerie A Leschber, MD  ibuprofen (ADVIL,MOTRIN) 800 MG tablet TAKE 1 TABLET BY MOUTH EVERY 8 HOURS AS NEEDED FOR PAIN 04/13/14  Yes Newt LukesValerie A Leschber, MD  losartan (COZAAR) 100 MG tablet TAKE 1 TABLET BY MOUTH EVERY DAY 12/07/13  Yes Newt LukesValerie A Leschber, MD  mometasone (NASONEX) 50 MCG/ACT nasal spray Place 2 sprays into the nose daily as needed. For nasal spray   Yes Historical Provider, MD  montelukast (SINGULAIR) 10 MG tablet Take 1 tablet (10 mg total) by mouth at bedtime. 08/08/12  Yes  Collene GobbleSteven A Daub, MD  norethindrone (AYGESTIN) 5 MG tablet Take 1 by mouth daily 07/06/13  Yes Historical Provider, MD  olopatadine (PATANOL) 0.1 % ophthalmic solution Place 1 drop into both eyes 2 (two) times daily. 08/08/12  Yes Collene GobbleSteven A Daub, MD  sitaGLIPtin (JANUVIA) 100 MG tablet Take 1 tablet (100 mg total) by mouth daily. 07/22/13  Yes Newt LukesValerie A Leschber, MD    Medical, Surgical, Family and Social History reviewed and updated.  HPI  Presents for evaluation of nasal congestion. About a week ago, she developed a scratchy throat, then increased sneezing, itchy eyes, etc, typical for her springtime allergies. Unable to breath through her nose x 2 days, so she cannot sleep at night. Usually gets "a shot and a zpack" or "a prednisone pack" for symptoms like this.  "If I wait too long, then it attacks the asthma, and then I really cant breathe." Feels a little itchy inside her chest on the LEFT. No SOB, CP. No recent decongestants. Reports that using her home medications hasn't helped at all.  When asked about why her BP might be so elevated she reports that she came here straight from a stressful meeting.  Her record is reviewed and no readings like this are noted previously. She denies HA, SOB, CP, dizziness, vision changes. Later, she confesses that she's been out of her medications for "several days" and just picked them up at the pharmacy.  Review of  Systems As above.    Objective:   Physical Exam  Constitutional: She is oriented to person, place, and time. Vital signs are normal. She appears well-developed and well-nourished. She is active and cooperative. No distress.  BP 220/110 mmHg  Pulse 64  Temp(Src) 99 F (37.2 C) (Oral)  Resp 16  Ht 5\' 7"  (1.702 m)  Wt 319 lb (144.697 kg)  BMI 49.95 kg/m2  SpO2 99%  LMP 07/28/2011  HENT:  Head: Normocephalic and atraumatic.  Right Ear: Hearing, tympanic membrane, external ear and ear canal normal.  Left Ear: Hearing, tympanic membrane,  external ear and ear canal normal.  Nose: Mucosal edema present. No rhinorrhea.  Mouth/Throat: Uvula is midline, oropharynx is clear and moist and mucous membranes are normal.  Eyes: Conjunctivae, EOM and lids are normal. Pupils are equal, round, and reactive to light. No scleral icterus.  Neck: Normal range of motion. Neck supple. No thyromegaly present.  Cardiovascular: Normal rate, regular rhythm and normal heart sounds.   Pulses:      Radial pulses are 2+ on the right side, and 2+ on the left side.  Pulmonary/Chest: Effort normal and breath sounds normal.  Lymphadenopathy:       Head (right side): No tonsillar, no preauricular, no posterior auricular and no occipital adenopathy present.       Head (left side): No tonsillar, no preauricular, no posterior auricular and no occipital adenopathy present.    She has no cervical adenopathy.       Right: No supraclavicular adenopathy present.       Left: No supraclavicular adenopathy present.  Neurological: She is alert and oriented to person, place, and time. No sensory deficit.  Skin: Skin is warm, dry and intact. No rash noted. No cyanosis or erythema. Nails show no clubbing.  Psychiatric: She has a normal mood and affect. Her speech is normal and behavior is normal.  Vitals reviewed.  Repeat BP: RIGHT ARM: 190/90 LEFT ARM: 180/100       Assessment & Plan:  1. Allergic rhinitis, unspecified allergic rhinitis type I'm not comfortable using an oral steroid given her significantly elevated BP today. I also to not think that she has a bacterial infection now, though she's certainly at risk, and with her asthma may need more aggressive treatment than non-asthmatics with similar symptoms. OTC nasal decongestant spray Q12 hours x 4 doses, then re-evaluate with me on 04/22/2014.  2. Essential hypertension Very elevated today. In part due to medication non-compliance. Restart medication. Avoid oral steroids today. See above. Re-[place]  [place]2014 [place]. If she develops HA, dizziness, CP or SOB, she needs re-evaluation sooner.   Fernande Brashelle S. Kalil Woessner, PA-C Physician Assistant-Certified [facility]

## 2014-05-20 ENCOUNTER — Other Ambulatory Visit: Payer: Self-pay

## 2014-05-20 DIAGNOSIS — Z1231 Encounter for screening mammogram for malignant neoplasm of breast: Secondary | ICD-10-CM

## 2014-05-24 ENCOUNTER — Encounter: Payer: Self-pay | Admitting: Family

## 2014-05-24 ENCOUNTER — Ambulatory Visit (INDEPENDENT_AMBULATORY_CARE_PROVIDER_SITE_OTHER): Payer: BC Managed Care – PPO | Admitting: Family

## 2014-05-24 VITALS — BP 150/86 | HR 57 | Temp 98.6°F | Resp 18 | Ht 68.0 in | Wt 322.4 lb

## 2014-05-24 DIAGNOSIS — H938X1 Other specified disorders of right ear: Secondary | ICD-10-CM

## 2014-05-24 MED ORDER — AMOXICILLIN 500 MG PO CAPS: 500.0000 mg | ORAL_CAPSULE | Freq: Two times a day (BID) | ORAL | Status: DC

## 2014-05-24 MED ORDER — FLUCONAZOLE 150 MG PO TABS: 150.0000 mg | ORAL_TABLET | Freq: Once | ORAL | Status: DC

## 2014-05-24 NOTE — Assessment & Plan Note (Signed)
Symptoms and exam consistent with otitis media. Start amoxicillin. Patient notes multiple ear infections, declines referral to ENT at this time. Follow-up if symptoms worsen or fail to improve.

## 2014-05-24 NOTE — Progress Notes (Signed)
Pre visit review using our clinic review tool, if applicable. No additional management support is needed unless otherwise documented below in the visit note. 

## 2014-05-24 NOTE — Progress Notes (Signed)
Subjective:    Patient ID: Debra [name], female    DOB: 07/18/1975, 39 y.o.   MRN: [id number]  Chief Complaint  Patient presents with  . Ear pressure    right ear pressure, feels clogged like shes in a hole, x3 week did have pain starting out then it went away      HPI:  Debra Droughtiffany L Weingarten is a 39 y.o. female who presents today for an acute visit.   Associated symptoms of sharp right ear pressure and feeling clogged has been going on for about 3 weeks. Indicates she had pain intially but then went away. Has taken allergy medication and nose spray, nothing seems to help.    Allergies  Allergen Reactions  . Influenza Vaccines   . Morphine Hives    Current Outpatient Prescriptions on File Prior to Visit  Medication Sig Dispense Refill  . albuterol (PROAIR HFA) 108 (90 BASE) MCG/ACT inhaler Inhale 2 puffs into the lungs every 4 (four) hours as needed. 18 g 2  . fexofenadine (ALLEGRA) 180 MG tablet Take 180 mg by mouth daily as needed. For seasonal allergies    . Fluticasone-Salmeterol (ADVAIR DISKUS) 250-50 MCG/DOSE AEPB Inhale 1 puff into the lungs 2 (two) times daily. 1 each 3  . glucose blood (ONE TOUCH ULTRA TEST) test strip Use as directed 100 each 2  . ibuprofen (ADVIL,MOTRIN) 800 MG tablet TAKE 1 TABLET BY MOUTH EVERY 8 HOURS AS NEEDED FOR PAIN 30 tablet 0  . losartan (COZAAR) 100 MG tablet TAKE 1 TABLET BY MOUTH EVERY DAY 90 tablet 1  . mometasone (NASONEX) 50 MCG/ACT nasal spray Place 2 sprays into the nose daily as needed. For nasal spray    . montelukast (SINGULAIR) 10 MG tablet Take 1 tablet (10 mg total) by mouth at bedtime. 30 tablet 11  . norethindrone (AYGESTIN) 5 MG tablet Take 1 by mouth daily    . olopatadine (PATANOL) 0.1 % ophthalmic solution Place 1 drop into both eyes 2 (two) times daily. 5 mL 12  . sitaGLIPtin (JANUVIA) 100 MG tablet Take 1 tablet (100 mg total) by mouth daily. 30 tablet 5   No current facility-administered medications on file prior to  visit.    Review of Systems  Constitutional: Negative for fever and chills.  HENT: Positive for congestion and sinus pressure.   Respiratory: Positive for shortness of breath. Negative for chest tightness.   Cardiovascular: Negative for chest pain.  Neurological: Positive for headaches.      Objective:    BP 150/86 mmHg  Pulse 57  Resp 18  Ht 5\' 8"  (1.727 m)  Wt 322 lb 6.4 oz (146.24 kg)  BMI 49.03 kg/m2  SpO2 99%  LMP 07/28/2011 Nursing note and vital signs reviewed.  Physical Exam  Constitutional: She is oriented to person, place, and time. She appears well-developed and well-nourished. No distress.  HENT:  Right Ear: Hearing, external ear and ear canal normal. Tympanic membrane is erythematous.  Left Ear: Hearing, tympanic membrane, external ear and ear canal normal.  Nose: Right sinus exhibits no maxillary sinus tenderness and no frontal sinus tenderness. Left sinus exhibits no maxillary sinus tenderness and no frontal sinus tenderness.  Mouth/Throat: Uvula is midline, oropharynx is clear and moist and mucous membranes are normal.  Cardiovascular: Normal rate, regular rhythm, normal heart sounds and intact distal pulses.   Pulmonary/Chest: Effort normal and breath sounds normal.  Neurological: She is alert and oriented to person, place, and time.  Skin: Skin is  warm and dry.  Psychiatric: She has a normal mood and affect. Her behavior is normal. Judgment and thought content normal.       Assessment & Plan:

## 2014-05-24 NOTE — Patient Instructions (Addendum)
Thank you for choosing [facility].  Summary/Instructions:  Your prescription(s) have been submitted to your pharmacy or been printed and provided for you. Please take as directed and contact our office if you believe you are having problem(s) with the medication(s) or have any questions.  If your symptoms worsen or fail to improve, please contact our office for further instruction, or in case of emergency go directly to the emergency room at the closest medical facility.    Otitis Media Otitis media is redness, soreness, and inflammation of the middle ear. Otitis media may be caused by allergies or, most commonly, by infection. Often it occurs as a complication of the common cold. SIGNS AND SYMPTOMS Symptoms of otitis media may include:  Earache.  Fever.  Ringing in your ear.  Headache.  Leakage of fluid from the ear. DIAGNOSIS To diagnose otitis media, your health care provider will examine your ear with an otoscope. This is an instrument that allows your health care provider to see into your ear in order to examine your eardrum. Your health care provider also will ask you questions about your symptoms. TREATMENT  Typically, otitis media resolves on its own within 3-5 days. Your health care provider may prescribe medicine to ease your symptoms of pain. If otitis media does not resolve within 5 days or is recurrent, your health care provider may prescribe antibiotic medicines if he or she suspects that a bacterial infection is the cause. HOME CARE INSTRUCTIONS   If you were prescribed an antibiotic medicine, finish it all even if you start to feel better.  Take medicines only as directed by your health care provider.  Keep all follow-up visits as directed by your health care provider. SEEK MEDICAL CARE IF:  You have otitis media only in one ear, or bleeding from your nose, or both.  You notice a lump on your neck.  You are not getting better in 3-5 days.  You feel  worse instead of better. SEEK IMMEDIATE MEDICAL CARE IF:   You have pain that is not controlled with medicine.  You have swelling, redness, or pain around your ear or stiffness in your neck.  You notice that part of your face is paralyzed.  You notice that the bone behind your ear (mastoid) is tender when you touch it. MAKE SURE YOU:   Understand these instructions.  Will watch your condition.  Will get help right away if you are not doing well or get worse. Document Released: 01/12/2004 Document Revised: 08/23/2013 Document Reviewed: 11/03/2012 ExitCare Patient Information 2015 [employer]. This information is not intended to replace advice given to you by your health care provider. Make sure you discuss any questions you have with your health care provider.  

## 2014-05-30 ENCOUNTER — Other Ambulatory Visit (INDEPENDENT_AMBULATORY_CARE_PROVIDER_SITE_OTHER): Payer: BC Managed Care – PPO

## 2014-05-30 ENCOUNTER — Ambulatory Visit (INDEPENDENT_AMBULATORY_CARE_PROVIDER_SITE_OTHER): Payer: BC Managed Care – PPO | Admitting: Internal Medicine

## 2014-05-30 ENCOUNTER — Encounter: Payer: Self-pay | Admitting: Internal Medicine

## 2014-05-30 VITALS — BP 130/84 | HR 67 | Temp 97.8°F | Ht 68.0 in | Wt 320.2 lb

## 2014-05-30 DIAGNOSIS — IMO0002 Reserved for concepts with insufficient information to code with codable children: Secondary | ICD-10-CM

## 2014-05-30 DIAGNOSIS — E1165 Type 2 diabetes mellitus with hyperglycemia: Secondary | ICD-10-CM

## 2014-05-30 DIAGNOSIS — Z23 Encounter for immunization: Secondary | ICD-10-CM

## 2014-05-30 DIAGNOSIS — I10 Essential (primary) hypertension: Secondary | ICD-10-CM

## 2014-05-30 LAB — HEMOGLOBIN A1C: HEMOGLOBIN A1C: 9.6 % — AB (ref 4.6–6.5)

## 2014-05-30 MED ORDER — LOSARTAN POTASSIUM 100 MG PO TABS: 100.0000 mg | ORAL_TABLET | Freq: Every day | ORAL | Status: DC

## 2014-05-30 MED ORDER — SITAGLIPTIN PHOSPHATE 100 MG PO TABS: 50.0000 mg | ORAL_TABLET | Freq: Every day | ORAL | Status: DC

## 2014-05-30 NOTE — Patient Instructions (Signed)
It was good to see you today.  We have reviewed your prior records including labs and tests today  Pneumonia vaccine updated today along with tetanus (Tdap)  Test(s) ordered today. Your results will be released to MyChart (or called to you) after review, usually within 72hours after test completion. If any changes need to be made, you will be notified at that same time.  Medications reviewed and updated Okay to take 50 mg Januvia daily for diabetes. No other changes recommended at this time.  we'll make referral to  ophthalmology for eye exam as discussed . Our office will contact you regarding appointment(s) once made.  Please schedule followup in 3-4 months, call sooner if problems.

## 2014-05-30 NOTE — Assessment & Plan Note (Signed)
Working on weight loss - doing well thus far! S/p Lap band 10/02/10 - start weight 349# encouragement provided  Wt Readings from Last 3 Encounters:  05/30/14 320 lb 4 oz (145.264 kg)  05/24/14 322 lb 6.4 oz (146.24 kg)  04/20/14 319 lb (144.697 kg)

## 2014-05-30 NOTE — Assessment & Plan Note (Signed)
"  diet controlled" until 12/2011, off all meds following lap band 09/2010  Then metformin resumed 12/2011 due to a1c>7   solo metformin stopped due to GI side effects so started Januvia 06/2012 Brief trial glipizide 08/2012 poorly tolerated so elevated to resume Januvia summer 2015 Check a1c now, titrate as needed pt will call if cbg>250 or <100  Lab Results  Component Value Date   HGBA1C 8.3* 11/25/2013

## 2014-05-30 NOTE — Progress Notes (Signed)
Pre visit review using our clinic review tool, if applicable. No additional management support is needed unless otherwise documented below in the visit note. 

## 2014-05-30 NOTE — Progress Notes (Signed)
Subjective:    Patient ID: Debra [name], female    DOB: 01/04/1976, 39 y.o.   MRN: [id number]  HPI  Patient here for followup  Past Medical History  Diagnosis Date  . Diabetes mellitus type II 08/2009 dx  . Morbid obesity     s/p lab band 09/2010  . Knee pain, right   . Asthma   . Allergic rhinitis, cause unspecified   . Ovarian cyst     right s/p resection June 2013  . Hypertension   . Endometriosis     Review of Systems  Constitutional: Positive for fatigue. Negative for fever.  Respiratory: Negative for cough and shortness of breath.   Cardiovascular: Negative for chest pain and leg swelling.       Objective:    Physical Exam  Constitutional: She appears well-developed and well-nourished. No distress.  obese  Cardiovascular: Normal rate, regular rhythm and normal heart sounds.   No murmur heard. Pulmonary/Chest: Effort normal and breath sounds normal. No respiratory distress.  Musculoskeletal: She exhibits no edema.    BP 130/84 mmHg  Pulse 67  Temp(Src) 97.8 F (36.6 C) (Oral)  Ht  (1.727 m)  Wt 320 lb 4 oz (145.264 kg)  BMI 48.70 kg/m2  SpO2 98%  LMP 07/28/2011 Wt Readings from Last 3 Encounters:  05/30/14 320 lb 4 oz (145.264 kg)  05/24/14 322 lb 6.4 oz (146.24 kg)  04/20/14 319 lb (144.697 kg)    Lab Results  Component Value Date   WBC 9.4 07/22/2013   HGB 11.5* 07/22/2013   HCT 36.5 07/22/2013   PLT 307.0 07/22/2013   GLUCOSE 181* 07/22/2013   CHOL 141 11/10/2013   TRIG 44.0 11/10/2013   HDL 33.70* 11/10/2013   LDLCALC 99 11/10/2013   ALT 14 07/22/2013   AST 14 07/22/2013   NA 139 07/22/2013   K 4.0 07/22/2013   CL 104 07/22/2013   CREATININE 0.8 07/22/2013   BUN 10 07/22/2013   CO2 27 07/22/2013   TSH 1.66 07/22/2013   HGBA1C 8.3* 11/25/2013   MICROALBUR 0.1 07/22/2013    No results found.     Assessment & Plan:   Problem List Items Addressed This Visit    Diabetes mellitus type 2, uncontrolled - Primary    "diet  controlled" until 12/2011, off all meds following lap band 09/2010  Then metformin resumed 12/2011 due to a1c>7   solo metformin stopped due to GI side effects so started Januvia 06/2012 Brief trial glipizide 08/2012 poorly tolerated so elevated to resume Januvia summer 2015 Check a1c now, titrate as needed pt will call if cbg>250 or <100  Lab Results  Component Value Date   HGBA1C 8.3* 11/25/2013        Relevant Medications   losartan (COZAAR) tablet   sitaGLIPtin (JANUVIA) tablet   Other Relevant Orders   Hemoglobin A1c   Ambulatory referral to Ophthalmology   Hypertension    strong FH same started ARB early 08/2011 added HCTZ late 08/2011 - changed to combo 12/2011, but pt stopped same due to dislike diuretic Resumed HCTZ 25 qAM 12/2012, but frequent noncompliance with same so DC'd from med list  BP Readings from Last 3 Encounters:  05/30/14 130/84  05/24/14 150/86  04/20/14 220/110        Relevant Medications   losartan (COZAAR) tablet   Morbid obesity    Working on weight loss - doing well thus far! S/p Lap band 10/02/10 - start weight 349# encouragement provided  Wt Readings from Last 3 Encounters:  05/30/14 320 lb 4 oz (145.264 kg)  05/24/14 322 lb 6.4 oz (146.24 kg)  04/20/14 319 lb (144.697 kg)        Relevant Medications   sitaGLIPtin (JANUVIA) tablet    Other Visit Diagnoses    Need for prophylactic vaccination with combined diphtheria-tetanus-pertussis (DTP) vaccine        Relevant Orders    Tdap vaccine greater than or equal to 7yo IM        Rene PaciValerie Leschber, MD

## 2014-05-30 NOTE — Assessment & Plan Note (Signed)
strong FH same started ARB early 08/2011 added HCTZ late 08/2011 - changed to combo 12/2011, but pt stopped same due to dislike diuretic Resumed HCTZ 25 qAM 12/2012, but frequent noncompliance with same so DC'd from med list  BP Readings from Last 3 Encounters:  05/30/14 130/84  05/24/14 150/86  04/20/14 220/110

## 2014-06-02 ENCOUNTER — Ambulatory Visit
Admission: RE | Admit: 2014-06-02 | Discharge: 2014-06-02 | Disposition: A | Discharge status: Home / Self Care | Payer: BC Managed Care – PPO | Source: Ambulatory Visit

## 2014-06-02 DIAGNOSIS — Z1231 Encounter for screening mammogram for malignant neoplasm of breast: Secondary | ICD-10-CM

## 2014-06-03 ENCOUNTER — Telehealth: Payer: Self-pay | Admitting: Internal Medicine

## 2014-06-03 MED ORDER — BLOOD GLUCOSE METER KIT: PACK | Status: DC

## 2014-06-03 NOTE — Telephone Encounter (Signed)
Called pt and confirmed the glucose meter that she needed.  Erx for the One Touch Ultra

## 2014-06-03 NOTE — Telephone Encounter (Signed)
Patient called requesting a glucose monitor and supplies. She does not currently have one. Pt uses [facility] on [address] [phone]

## 2014-07-26 ENCOUNTER — Encounter: Payer: Self-pay | Admitting: Internal Medicine

## 2014-07-26 ENCOUNTER — Ambulatory Visit (INDEPENDENT_AMBULATORY_CARE_PROVIDER_SITE_OTHER): Payer: BC Managed Care – PPO | Admitting: Internal Medicine

## 2014-07-26 VITALS — BP 140/88 | HR 63 | Temp 98.8°F | Ht 68.0 in | Wt 324.8 lb

## 2014-07-26 DIAGNOSIS — J3089 Other allergic rhinitis: Secondary | ICD-10-CM | POA: Diagnosis not present

## 2014-07-26 MED ORDER — CEFUROXIME AXETIL 500 MG PO TABS: 500.0000 mg | ORAL_TABLET | Freq: Two times a day (BID) | ORAL | Status: DC

## 2014-07-26 NOTE — Patient Instructions (Signed)
Plain Mucinex (NOT D) for thick secretions ;force NON dairy fluids .   Nasal cleansing in the shower as discussed with lather of mild shampoo.After 10 seconds wash off lather while  exhaling through nostrils. Make sure that all residual soap is removed to prevent irritation.  Flonase OR Nasacort AQ 1 spray in each nostril twice a day as needed. Use the "crossover" technique into opposite nostril spraying toward opposite ear @ 45 degree angle, not straight up into nostril.  Plain Allegra (NOT D )  160 daily , Loratidine 10 mg , OR Zyrtec 10 mg @ bedtime  as needed for itchy eyes & sneezing.  Fill the  prescription for antibiotic if fever, discolored nasal or chest secretions or significant pain above & below eyes appear in the next 48-72 hours. 

## 2014-07-26 NOTE — Progress Notes (Signed)
Pre visit review using our clinic review tool, if applicable. No additional management support is needed unless otherwise documented below in the visit note. 

## 2014-07-26 NOTE — Progress Notes (Signed)
   Subjective:    Patient ID: Debra [name], female    DOB: 11/28/1975, 39 y.o.   MRN: [id number]  HPI Her symptoms began 07/23/14 as sore throat in the context of postnasal drainage, itchy eyes, and sneezing. She's developed a nocturnal cough associated with wheezing. Nasal secretions remain clear. She's had some ear discomfort without discharge. Sputum is thick and clear as well. She does have hot flashes related to gynecologic surgery.  She does not smoke. She has a history of asthma.  Review of Systems  She has no frontal headache, facial pain, nasal purulence, dental pain, otic discharge, fever, or chills.     Objective:   Physical Exam  General appearance:Adequately nourished; no acute distress or increased work of breathing is present.   BMI: 49.39  Lymphatic: No  lymphadenopathy about the head, neck, or axilla .  Eyes: No conjunctival inflammation or lid edema is present. There is no scleral icterus.  Ears:  External ear exam shows no significant lesions or deformities.  Otoscopic examination reveals clear canals, tympanic membranes are intact bilaterally without bulging, retraction, inflammation or discharge.  Nose:  External nasal examination shows no deformity or inflammation. Nasal mucosa severely erythematous without lesions or exudates No septal dislocation or deviation.No obstruction to airflow.   Oral exam: Dental hygiene is good; lips and gums are healthy appearing.There is irregular faint palantine  erythema w/o exudate .  Neck:  No deformities, thyromegaly, masses, or tenderness noted.   Supple with full range of motion without pain.   Heart:  Normal rate and regular rhythm. S1 and S2 normal without gallop, murmur, click, rub or other extra sounds.   Lungs:Chest clear to auscultation; no wheezes, rhonchi,rales ,or rubs present.  Extremities:  No cyanosis, edema, or clubbing  noted    Skin: Warm & dry w/o tenting   No significant lesions or rash.         Assessment & Plan:  #1 allergic rhinitis #2 sore throat due to #1 See orders & AVS

## 2014-08-08 ENCOUNTER — Other Ambulatory Visit: Payer: Self-pay

## 2014-08-08 MED ORDER — SITAGLIPTIN PHOSPHATE 100 MG PO TABS: 50.0000 mg | ORAL_TABLET | Freq: Every day | ORAL | Status: DC

## 2014-10-18 ENCOUNTER — Other Ambulatory Visit (INDEPENDENT_AMBULATORY_CARE_PROVIDER_SITE_OTHER): Payer: BC Managed Care – PPO

## 2014-10-18 ENCOUNTER — Ambulatory Visit (INDEPENDENT_AMBULATORY_CARE_PROVIDER_SITE_OTHER): Payer: BC Managed Care – PPO | Admitting: Internal Medicine

## 2014-10-18 ENCOUNTER — Encounter: Payer: Self-pay | Admitting: Internal Medicine

## 2014-10-18 VITALS — BP 124/80 | HR 62 | Temp 98.2°F | Ht 68.0 in | Wt 325.2 lb

## 2014-10-18 DIAGNOSIS — E1165 Type 2 diabetes mellitus with hyperglycemia: Secondary | ICD-10-CM

## 2014-10-18 DIAGNOSIS — IMO0002 Reserved for concepts with insufficient information to code with codable children: Secondary | ICD-10-CM

## 2014-10-18 DIAGNOSIS — I10 Essential (primary) hypertension: Secondary | ICD-10-CM | POA: Diagnosis not present

## 2014-10-18 DIAGNOSIS — H9201 Otalgia, right ear: Secondary | ICD-10-CM | POA: Diagnosis not present

## 2014-10-18 LAB — MICROALBUMIN / CREATININE URINE RATIO
CREATININE, U: 372.5 mg/dL
Microalb Creat Ratio: 0.5 mg/g (ref 0.0–30.0)
Microalb, Ur: 1.7 mg/dL (ref 0.0–1.9)

## 2014-10-18 LAB — HEMOGLOBIN A1C: HEMOGLOBIN A1C: 8.7 % — AB (ref 4.6–6.5)

## 2014-10-18 NOTE — Assessment & Plan Note (Signed)
Working on weight loss - diet, swimming and gym S/p Lap band 10/02/10 - start weight 349# encouragement provided  Wt Readings from Last 3 Encounters:  10/18/14 325 lb 4 oz (147.532 kg)  07/26/14 324 lb 12 oz (147.306 kg)  05/30/14 320 lb 4 oz (145.264 kg)

## 2014-10-18 NOTE — Assessment & Plan Note (Signed)
"  diet controlled" until 12/2011, off all meds following lap band 09/2010  Then metformin resumed 12/2011 due to a1c>7   solo metformin stopped due to GI side effects so started Januvia 06/2012 Brief trial glipizide 08/2012 poorly tolerated so elected to resume Januvia summer 2015 Variable compliance reviewed Check a1c now, titrate as needed pt will call if cbg>250 or <100  Lab Results  Component Value Date   HGBA1C 9.6* 05/30/2014

## 2014-10-18 NOTE — Assessment & Plan Note (Signed)
strong FH same started ARB early 08/2011 added HCTZ late 08/2011 - changed to combo 12/2011, but pt stopped same due to dislike diuretic Resumed HCTZ 25 qAM 12/2012, but frequent noncompliance with same so DC'd from med list  BP Readings from Last 3 Encounters:  10/18/14 124/80  07/26/14 140/88  05/30/14 130/84

## 2014-10-18 NOTE — Progress Notes (Signed)
Pre visit review using our clinic review tool, if applicable. No additional management support is needed unless otherwise documented below in the visit note. 

## 2014-10-18 NOTE — Progress Notes (Signed)
Subjective:    Patient ID: Debra [name], female    DOB: 03-16-1976, 39 y.o.   MRN: [id number]  HPI  Patient here for followup - reviewed chronic medical issues, interval events, current concerns  Past Medical History  Diagnosis Date  . Diabetes mellitus type II 08/2009 dx  . Morbid obesity     s/p lab band 09/2010  . Knee pain, right   . Asthma   . Allergic rhinitis, cause unspecified   . Ovarian cyst     right s/p resection June 2013  . Hypertension   . Endometriosis     Review of Systems  Constitutional: Negative for fever and fatigue.  HENT: Positive for ear pain (R>L sharp quick stab pain each day, but not predictable trigger). Negative for hearing loss, sinus pressure and tinnitus.   Respiratory: Negative for cough and shortness of breath.   Cardiovascular: Negative for chest pain and leg swelling.  Musculoskeletal:       B leg cramps since weekend, ?dehydrated       Objective:    Physical Exam  Constitutional: She appears well-developed and well-nourished. No distress.  Obese; mom at side  HENT:  Right Ear: Tympanic membrane, external ear and ear canal normal.  Left Ear: Tympanic membrane, external ear and ear canal normal.  Mouth/Throat: Oropharynx is clear and moist. No oropharyngeal exudate.  Cardiovascular: Normal rate, regular rhythm and normal heart sounds.   No murmur heard. Pulmonary/Chest: Effort normal and breath sounds normal. No respiratory distress.  Musculoskeletal: She exhibits no edema.    BP 124/80 mmHg  Pulse 62  Temp(Src) 98.2 F (36.8 C) (Oral)  Ht  (1.727 m)  Wt 325 lb 4 oz (147.532 kg)  BMI 49.47 kg/m2  SpO2 98%  LMP 07/28/2011 Wt Readings from Last 3 Encounters:  10/18/14 325 lb 4 oz (147.532 kg)  07/26/14 324 lb 12 oz (147.306 kg)  05/30/14 320 lb 4 oz (145.264 kg)    Lab Results  Component Value Date   WBC 9.4 07/22/2013   HGB 11.5* 07/22/2013   HCT 36.5 07/22/2013   PLT 307.0 07/22/2013   GLUCOSE 181*  07/22/2013   CHOL 141 11/10/2013   TRIG 44.0 11/10/2013   HDL 33.70* 11/10/2013   LDLCALC 99 11/10/2013   ALT 14 07/22/2013   AST 14 07/22/2013   NA 139 07/22/2013   K 4.0 07/22/2013   CL 104 07/22/2013   CREATININE 0.8 07/22/2013   BUN 10 07/22/2013   CO2 27 07/22/2013   TSH 1.66 07/22/2013   HGBA1C 9.6* 05/30/2014   MICROALBUR 0.1 07/22/2013    Mm Digital Screening Bilateral  06/03/2014   CLINICAL DATA:  Screening.  EXAM: DIGITAL SCREENING BILATERAL MAMMOGRAM WITH CAD  COMPARISON:  None.  ACR Breast Density Category a: The breast tissue is almost entirely fatty.  FINDINGS: There are no findings suspicious for malignancy. Images were processed with CAD.  IMPRESSION: No mammographic evidence of malignancy. A result letter of this screening mammogram will be mailed directly to the patient.  RECOMMENDATION: Screening mammogram in one year. (Code:SM-B-01Y)  BI-RADS CATEGORY  1: Negative.   Electronically Signed   By: Amie Portland M.D.   On: 06/03/2014 13:11      Assessment & Plan:    Problem List Items Addressed This Visit    Diabetes mellitus type 2, uncontrolled - Primary    "diet controlled" until 12/2011, off all meds following lap band 09/2010  Then metformin resumed 12/2011 due to a1c>7  solo metformin stopped due to GI side effects so started Januvia 06/2012 Brief trial glipizide 08/2012 poorly tolerated so elected to resume Januvia summer 2015 Variable compliance reviewed Check a1c now, titrate as needed pt will call if cbg>250 or <100  Lab Results  Component Value Date   HGBA1C 9.6* 05/30/2014        Relevant Orders   Hemoglobin A1c   Ambulatory referral to Ophthalmology   Microalbumin / creatinine urine ratio   Hypertension    strong FH same started ARB early 08/2011 added HCTZ late 08/2011 - changed to combo 12/2011, but pt stopped same due to dislike diuretic Resumed HCTZ 25 qAM 12/2012, but frequent noncompliance with same so DC'd from med list  BP Readings from  Last 3 Encounters:  10/18/14 124/80  07/26/14 140/88  05/30/14 130/84        Morbid obesity    Working on weight loss - diet, swimming and gym S/p Lap band 10/02/10 - start weight 349# encouragement provided  Wt Readings from Last 3 Encounters:  10/18/14 325 lb 4 oz (147.532 kg)  07/26/14 324 lb 12 oz (147.306 kg)  05/30/14 320 lb 4 oz (145.264 kg)         Other Visit Diagnoses    Right ear pain        Relevant Orders    Ambulatory referral to ENT        Rene PaciValerie Ameirah Khatoon, MD

## 2014-10-18 NOTE — Patient Instructions (Addendum)
It was good to see you today.  We have reviewed your prior records including labs and tests today  Test(s) ordered today. Your results will be released to MyChart (or called to you) after review, usually within 72hours after test completion. If any changes need to be made, you will be notified at that same time.  Medications reviewed and updated, no changes recommended at this time. Refill on medication(s) as discussed today.  we'll make referral to ENT for your ear pain. Our office will contact you regarding appointment(s) once made.  Please schedule followup in 4 months, call sooner if problems.  Diabetes Mellitus and Food It is important for you to manage your blood sugar (glucose) level. Your blood glucose level can be greatly affected by what you eat. Eating healthier foods in the appropriate amounts throughout the day at about the same time each day will help you control your blood glucose level. It can also help slow or prevent worsening of your diabetes mellitus. Healthy eating may even help you improve the level of your blood pressure and reach or maintain a healthy weight.  HOW CAN FOOD AFFECT ME? Carbohydrates Carbohydrates affect your blood glucose level more than any other type of food. Your dietitian will help you determine how many carbohydrates to eat at each meal and teach you how to count carbohydrates. Counting carbohydrates is important to keep your blood glucose at a healthy level, especially if you are using insulin or taking certain medicines for diabetes mellitus. Alcohol Alcohol can cause sudden decreases in blood glucose (hypoglycemia), especially if you use insulin or take certain medicines for diabetes mellitus. Hypoglycemia can be a life-threatening condition. Symptoms of hypoglycemia (sleepiness, dizziness, and disorientation) are similar to symptoms of having too much alcohol.  If your health care provider has given you approval to drink alcohol, do so in moderation  and use the following guidelines:  Women should not have more than one drink per day, and men should not have more than two drinks per day. One drink is equal to:  12 oz of beer.  5 oz of wine.  1 oz of hard liquor.  Do not drink on an empty stomach.  Keep yourself hydrated. Have water, diet soda, or unsweetened iced tea.  Regular soda, juice, and other mixers might contain a lot of carbohydrates and should be counted. WHAT FOODS ARE NOT RECOMMENDED? As you make food choices, it is important to remember that all foods are not the same. Some foods have fewer nutrients per serving than other foods, even though they might have the same number of calories or carbohydrates. It is difficult to get your body what it needs when you eat foods with fewer nutrients. Examples of foods that you should avoid that are high in calories and carbohydrates but low in nutrients include:  Trans fats (most processed foods list trans fats on the Nutrition Facts label).  Regular soda.  Juice.  Candy.  Sweets, such as cake, pie, doughnuts, and cookies.  Fried foods. WHAT FOODS CAN I EAT? Have nutrient-rich foods, which will nourish your body and keep you healthy. The food you should eat also will depend on several factors, including:  The calories you need.  The medicines you take.  Your weight.  Your blood glucose level.  Your blood pressure level.  Your cholesterol level. You also should eat a variety of foods, including:  Protein, such as meat, poultry, fish, tofu, nuts, and seeds (lean animal proteins are best).  Fruits.  Vegetables.  Dairy products, such as milk, cheese, and yogurt (low fat is best).  Breads, grains, pasta, cereal, rice, and beans.  Fats such as olive oil, trans fat-free margarine, canola oil, avocado, and olives. DOES EVERYONE WITH DIABETES MELLITUS HAVE THE SAME MEAL PLAN? Because every person with diabetes mellitus is different, there is not one meal plan  that works for everyone. It is very important that you meet with a dietitian who will help you create a meal plan that is just right for you. Document Released: 01/03/2005 Document Revised: 04/13/2013 Document Reviewed: 03/05/2013 [facility] Patient Information 2015 [place], [place]. This information is not intended to replace advice given to you by your health care provider. Make sure you discuss any questions you have with your health care provider.

## 2014-10-19 ENCOUNTER — Other Ambulatory Visit: Payer: Self-pay | Admitting: Internal Medicine

## 2014-11-14 LAB — HM DIABETES EYE EXAM

## 2014-11-17 ENCOUNTER — Encounter: Payer: Self-pay | Admitting: Internal Medicine

## 2015-01-16 ENCOUNTER — Emergency Department (HOSPITAL_COMMUNITY)
Admission: EM | Admit: 2015-01-16 | Discharge: 2015-01-16 | Disposition: A | Discharge status: Home / Self Care | Payer: BC Managed Care – PPO | Attending: Emergency Medicine | Admitting: Emergency Medicine

## 2015-01-16 ENCOUNTER — Encounter (HOSPITAL_COMMUNITY): Payer: Self-pay | Admitting: Emergency Medicine

## 2015-01-16 ENCOUNTER — Emergency Department (HOSPITAL_COMMUNITY): Payer: BC Managed Care – PPO

## 2015-01-16 DIAGNOSIS — Z7951 Long term (current) use of inhaled steroids: Secondary | ICD-10-CM | POA: Diagnosis not present

## 2015-01-16 DIAGNOSIS — Y92811 Bus as the place of occurrence of the external cause: Secondary | ICD-10-CM | POA: Diagnosis not present

## 2015-01-16 DIAGNOSIS — I10 Essential (primary) hypertension: Secondary | ICD-10-CM | POA: Diagnosis not present

## 2015-01-16 DIAGNOSIS — S8991XA Unspecified injury of right lower leg, initial encounter: Secondary | ICD-10-CM | POA: Insufficient documentation

## 2015-01-16 DIAGNOSIS — J45909 Unspecified asthma, uncomplicated: Secondary | ICD-10-CM | POA: Insufficient documentation

## 2015-01-16 DIAGNOSIS — Y998 Other external cause status: Secondary | ICD-10-CM | POA: Diagnosis not present

## 2015-01-16 DIAGNOSIS — W108XXA Fall (on) (from) other stairs and steps, initial encounter: Secondary | ICD-10-CM | POA: Insufficient documentation

## 2015-01-16 DIAGNOSIS — Z8742 Personal history of other diseases of the female genital tract: Secondary | ICD-10-CM | POA: Insufficient documentation

## 2015-01-16 DIAGNOSIS — Z793 Long term (current) use of hormonal contraceptives: Secondary | ICD-10-CM | POA: Insufficient documentation

## 2015-01-16 DIAGNOSIS — Z79899 Other long term (current) drug therapy: Secondary | ICD-10-CM | POA: Insufficient documentation

## 2015-01-16 DIAGNOSIS — Y9389 Activity, other specified: Secondary | ICD-10-CM | POA: Insufficient documentation

## 2015-01-16 DIAGNOSIS — M25461 Effusion, right knee: Secondary | ICD-10-CM | POA: Diagnosis not present

## 2015-01-16 DIAGNOSIS — E119 Type 2 diabetes mellitus without complications: Secondary | ICD-10-CM | POA: Diagnosis not present

## 2015-01-16 MED ORDER — NAPROXEN 500 MG PO TABS: 500.0000 mg | ORAL_TABLET | Freq: Two times a day (BID) | ORAL | Status: DC

## 2015-01-16 NOTE — Discharge Instructions (Signed)

## 2015-01-16 NOTE — ED Notes (Signed)
Pt ambulated to room w/o difficulty

## 2015-01-16 NOTE — ED Provider Notes (Signed)
CSN: [id number]     Arrival date & time 01/16/15  1715 History   By signing my name below, I, Forrestine Him, attest that this documentation has been prepared under the direction and in the presence of Kathrin Sharrod Achille PA-C. Electronically Signed: Forrestine Him, ED Scribe. 01/16/2015. 7:14 PM.    Chief Complaint  Patient presents with  . Fall  . Leg Injury   The history is provided by the patient. No language interpreter was used.    HPI Comments: Debra [name] is a 39 y.o. female with a PMHx of DM and HTN who presents to the Emergency Department complaining of constant, ongoing R sided leg pain with associated swelling onset earlier today. 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 No head/neck trauma or LOC. Discomfort is made worse with ambulation and weight bearing. No alleviating factors at this time. No OTC medications or home remedies attempted prior to arrival. No recent fever, chills, nausea, or vomiting. No numbness, loss of sensation, or weakness. 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 P with known allergies to Influenza vaccines and Morphine. She comes in because she is concerned about the swelling and pain to the right knee- no weakness or numbness.  Past Medical History  Diagnosis Date  . Diabetes mellitus type II 08/2009 dx  . Morbid obesity     s/p lab band 09/2010  . Knee pain, right   . Asthma   . Allergic rhinitis, cause unspecified   . Ovarian cyst     right s/p resection June 2013  . Hypertension   . Endometriosis    Past Surgical History  Procedure Laterality Date  . Laparoscopic gastric banding  10/02/2010    start weight 349#  . Salpingoophorectomy  10/08/11    right  . Abdominal hysterectomy  05/14/12    [place]   Family History  Problem Relation Age of Onset  . Heart disease Mother   . Hypertension Mother   . Stroke Mother   . Diabetes Father    Social History  Substance  Use Topics  . Smoking status: Never Smoker   . Smokeless tobacco: Never Used  . Alcohol Use: No   OB History    No data available     Review of Systems  Constitutional: Negative for fever and chills.  Gastrointestinal: Negative for nausea and vomiting.  Musculoskeletal: Positive for joint swelling and arthralgias.  Neurological: Negative for weakness and numbness.  All other systems reviewed and are negative.   Allergies  Influenza vaccines and Morphine  Home Medications   Prior to Admission medications   Medication Sig Start Date End Date Taking? Authorizing Provider  albuterol (PROAIR HFA) 108 (90 BASE) MCG/ACT inhaler Inhale 2 puffs into the lungs every 4 (four) hours as needed. 05/13/12   Rowe Clack, MD  blood glucose meter kit and supplies Dispense based on patient and insurance preference. Use up to four times daily as directed. (FOR ICD-9 250.00, 250.01). 06/03/14   Rowe Clack, MD  fexofenadine (ALLEGRA) 180 MG tablet Take 180 mg by mouth daily as needed. For seasonal allergies    Historical Provider, MD  Fluticasone-Salmeterol (ADVAIR DISKUS) 250-50 MCG/DOSE AEPB Inhale 1 puff into the lungs 2 (two) times daily. 07/22/11   Rowe Clack, MD  glucose blood (ONE TOUCH ULTRA TEST) test strip Use as directed 10/28/13   Rowe Clack, MD  ibuprofen (ADVIL,MOTRIN)  800 MG tablet Take 1 tablet (800 mg total) by mouth every 8 (eight) hours as needed. 10/19/14   Rowe Clack, MD  losartan (COZAAR) 100 MG tablet Take 1 tablet (100 mg total) by mouth daily. 05/30/14   Rowe Clack, MD  mometasone (NASONEX) 50 MCG/ACT nasal spray Place 2 sprays into the nose daily as needed. For nasal spray    Historical Provider, MD  montelukast (SINGULAIR) 10 MG tablet Take 1 tablet (10 mg total) by mouth at bedtime. 08/08/12   Darlyne Russian, MD  naproxen (NAPROSYN) 500 MG tablet Take 1 tablet (500 mg total) by mouth 2 (two) times daily. 01/16/15   Delos Haring, PA-C   norethindrone (AYGESTIN) 5 MG tablet Take 1 by mouth daily 07/06/13   Historical Provider, MD  olopatadine (PATANOL) 0.1 % ophthalmic solution Place 1 drop into both eyes 2 (two) times daily. 08/08/12   Darlyne Russian, MD  sitaGLIPtin (JANUVIA) 100 MG tablet Take 0.5 tablets (50 mg total) by mouth daily. 08/08/14   Rowe Clack, MD   Triage Vitals: BP 179/89 mmHg  Pulse 65  Temp(Src) 98.9 F (37.2 C) (Oral)  Resp 18  SpO2 100%  LMP 07/28/2011   Physical Exam  Constitutional: She is oriented to person, place, and time. She appears well-developed and well-nourished.  HENT:  Head: Normocephalic and atraumatic.  Mouth/Throat: No oropharyngeal exudate.  Neck: Normal range of motion. No tracheal deviation present.  Cardiovascular: Normal rate.   Pulmonary/Chest: Effort normal. No respiratory distress.  Abdominal: Soft. There is no tenderness.  Musculoskeletal:       Right knee: She exhibits decreased range of motion (due to pain), swelling and effusion. She exhibits no ecchymosis, no deformity, no laceration, no erythema, normal alignment, no LCL laxity and no bony tenderness. Tenderness (lateral and medial right knee) found. Medial joint line and lateral joint line tenderness noted.       Legs: Neurological: She is alert and oriented to person, place, and time.  Skin: Skin is warm and dry. She is not diaphoretic.  Psychiatric: She has a normal mood and affect. Her behavior is normal.  Nursing note and vitals reviewed.   ED Course  Procedures  DIAGNOSTIC STUDIES: Oxygen Saturation is 100% on RA, normal by my interpretation.    COORDINATION OF CARE: 7:13 PM- Will order DG knee completer 4 views R and DG ankle complete R. Discussed treatment plan with pt at bedside and pt agreed to plan.    Labs Review Labs Reviewed - No data to display  Imaging Review Dg Ankle Complete Right  01/16/2015   CLINICAL DATA:  Status post fall today with right ankle pain.  EXAM: RIGHT ANKLE -  COMPLETE 3+ VIEW  COMPARISON:  None.  FINDINGS: There is no evidence of fracture, dislocation, or joint effusion. There is minimal plantar calcaneal spur. Soft tissues are unremarkable.  IMPRESSION: No acute fracture or dislocation.   Electronically Signed   By: Abelardo Diesel M.D.   On: 01/16/2015 18:31   Dg Knee Complete 4 Views Right  01/16/2015   CLINICAL DATA:  Status post fall with right knee pain.  EXAM: RIGHT KNEE - COMPLETE 4+ VIEW  COMPARISON:  November 22, 2009  FINDINGS: There is no evidence of fracture, dislocation, or joint effusion. There are degenerative joint changes with narrowed joint space and osteophyte formation. Soft tissues are unremarkable.  IMPRESSION: Osteoarthritic changes of right knee. No acute fracture or dislocation.   Electronically Signed   By:  Abelardo Diesel M.D.   On: 01/16/2015 18:30   I have personally reviewed and evaluated these images and lab results as part of my medical decision-making.   EKG Interpretation None      MDM   Final diagnoses:  Knee injury, right, initial encounter    Unremarkable xray of the R knee and ankle. Concern for ligamentous injury given mechanism, swelling and pain. Knee immobilizer and cruthces, NSAIDs, referral to Ortho. 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 RICE. No head or neck injury.   Medications - No data to display  39 y.o.Debra [name]'s evaluation in the Emergency Department is complete. It has been determined that no acute conditions requiring further emergency intervention are present at this time. The patient/guardian have been advised of the diagnosis and plan. We have discussed signs and symptoms that warrant return to the ED, such as changes or worsening in symptoms.  Vital signs are stable at discharge. Filed Vitals:   01/16/15 1742  BP: 179/89  Pulse: 65  Temp: 98.9 F (37.2 C)  Resp: 18    Patient/guardian has voiced understanding and  agreed to follow-up with the PCP or specialist.   I personally performed the services described in this documentation, which was scribed in my presence. The recorded information has been reviewed and is accurate.    Satara Virella, PA-C 01/16/15 1933  Merrily Pew, MD 01/16/15 [phone]

## 2015-01-16 NOTE — ED Notes (Signed)
Pt c/o right leg pain from knee down post trip and fall today, states right leg became "pinned" under her. Pt ambulatory.

## 2015-02-01 ENCOUNTER — Ambulatory Visit: Payer: BC Managed Care – PPO | Admitting: Internal Medicine

## 2015-02-10 ENCOUNTER — Emergency Department (HOSPITAL_COMMUNITY)
Admission: EM | Admit: 2015-02-10 | Discharge: 2015-02-10 | Disposition: A | Discharge status: Home / Self Care | Payer: BC Managed Care – PPO | Attending: Emergency Medicine | Admitting: Emergency Medicine

## 2015-02-10 ENCOUNTER — Encounter (HOSPITAL_COMMUNITY): Payer: Self-pay | Admitting: *Deleted

## 2015-02-10 ENCOUNTER — Emergency Department (HOSPITAL_COMMUNITY): Payer: BC Managed Care – PPO

## 2015-02-10 DIAGNOSIS — Z79899 Other long term (current) drug therapy: Secondary | ICD-10-CM | POA: Diagnosis not present

## 2015-02-10 DIAGNOSIS — N809 Endometriosis, unspecified: Secondary | ICD-10-CM | POA: Diagnosis not present

## 2015-02-10 DIAGNOSIS — Z7984 Long term (current) use of oral hypoglycemic drugs: Secondary | ICD-10-CM | POA: Insufficient documentation

## 2015-02-10 DIAGNOSIS — Z7951 Long term (current) use of inhaled steroids: Secondary | ICD-10-CM | POA: Insufficient documentation

## 2015-02-10 DIAGNOSIS — E119 Type 2 diabetes mellitus without complications: Secondary | ICD-10-CM | POA: Insufficient documentation

## 2015-02-10 DIAGNOSIS — J189 Pneumonia, unspecified organism: Secondary | ICD-10-CM

## 2015-02-10 DIAGNOSIS — J159 Unspecified bacterial pneumonia: Secondary | ICD-10-CM | POA: Insufficient documentation

## 2015-02-10 DIAGNOSIS — R05 Cough: Secondary | ICD-10-CM | POA: Diagnosis present

## 2015-02-10 DIAGNOSIS — J45909 Unspecified asthma, uncomplicated: Secondary | ICD-10-CM | POA: Insufficient documentation

## 2015-02-10 DIAGNOSIS — Z8739 Personal history of other diseases of the musculoskeletal system and connective tissue: Secondary | ICD-10-CM | POA: Diagnosis not present

## 2015-02-10 DIAGNOSIS — Z792 Long term (current) use of antibiotics: Secondary | ICD-10-CM | POA: Insufficient documentation

## 2015-02-10 DIAGNOSIS — I10 Essential (primary) hypertension: Secondary | ICD-10-CM | POA: Insufficient documentation

## 2015-02-10 LAB — CBC WITH DIFFERENTIAL/PLATELET
BASOS ABS: 0 10*3/uL (ref 0.0–0.1)
Basophils Relative: 0 %
EOS PCT: 0 %
Eosinophils Absolute: 0 10*3/uL (ref 0.0–0.7)
HEMATOCRIT: 40.4 % (ref 36.0–46.0)
Hemoglobin: 13 g/dL (ref 12.0–15.0)
LYMPHS ABS: 2 10*3/uL (ref 0.7–4.0)
LYMPHS PCT: 24 %
MCH: 26.4 pg (ref 26.0–34.0)
MCHC: 32.2 g/dL (ref 30.0–36.0)
MCV: 81.9 fL (ref 78.0–100.0)
Monocytes Absolute: 0.5 10*3/uL (ref 0.1–1.0)
Monocytes Relative: 6 %
NEUTROS ABS: 5.8 10*3/uL (ref 1.7–7.7)
Neutrophils Relative %: 70 %
Platelets: 244 10*3/uL (ref 150–400)
RBC: 4.93 MIL/uL (ref 3.87–5.11)
RDW: 13.3 % (ref 11.5–15.5)
WBC: 8.4 10*3/uL (ref 4.0–10.5)

## 2015-02-10 LAB — URINALYSIS, ROUTINE W REFLEX MICROSCOPIC
Bilirubin Urine: NEGATIVE
Hgb urine dipstick: NEGATIVE
KETONES UR: NEGATIVE mg/dL
NITRITE: NEGATIVE
PH: 6 (ref 5.0–8.0)
Protein, ur: NEGATIVE mg/dL
SPECIFIC GRAVITY, URINE: 1.017 (ref 1.005–1.030)
Urobilinogen, UA: 0.2 mg/dL (ref 0.0–1.0)

## 2015-02-10 LAB — BASIC METABOLIC PANEL
ANION GAP: 5 (ref 5–15)
BUN: 7 mg/dL (ref 6–20)
CHLORIDE: 107 mmol/L (ref 101–111)
CO2: 24 mmol/L (ref 22–32)
Calcium: 8.6 mg/dL — ABNORMAL LOW (ref 8.9–10.3)
Creatinine, Ser: 0.82 mg/dL (ref 0.44–1.00)
GFR calc Af Amer: >60 mL/min (ref >60)
GFR calc non Af Amer: >60 mL/min (ref >60)
GLUCOSE: 226 mg/dL — AB (ref 65–99)
POTASSIUM: 3.6 mmol/L (ref 3.5–5.1)
Sodium: 136 mmol/L (ref 135–145)

## 2015-02-10 LAB — CBG MONITORING, ED: GLUCOSE-CAPILLARY: 232 mg/dL — AB (ref 65–99)

## 2015-02-10 LAB — URINE MICROSCOPIC-ADD ON

## 2015-02-10 MED ORDER — ONDANSETRON HCL 4 MG/2ML IJ SOLN
4.0000 mg | Freq: Once | INTRAMUSCULAR | Status: AC
  Administered 2015-02-10: 4 mg via INTRAVENOUS
  Filled 2015-02-10: qty 2

## 2015-02-10 MED ORDER — AZITHROMYCIN 250 MG PO TABS: 250.0000 mg | ORAL_TABLET | Freq: Every day | ORAL | Status: DC

## 2015-02-10 MED ORDER — AZITHROMYCIN 250 MG PO TABS
500.0000 mg | ORAL_TABLET | Freq: Once | ORAL | Status: AC
  Administered 2015-02-10: 500 mg via ORAL
  Filled 2015-02-10: qty 2

## 2015-02-10 MED ORDER — LIDOCAINE HCL (PF) 1 % IJ SOLN
INTRAMUSCULAR | Status: AC
  Administered 2015-02-10: 21 mL
  Filled 2015-02-10: qty 5

## 2015-02-10 MED ORDER — SODIUM CHLORIDE 0.9 % IV BOLUS (SEPSIS)
1000.0000 mL | Freq: Once | INTRAVENOUS | Status: AC
  Administered 2015-02-10: 1000 mL via INTRAVENOUS

## 2015-02-10 MED ORDER — CEFTRIAXONE SODIUM 1 G IJ SOLR
1.0000 g | Freq: Once | INTRAMUSCULAR | Status: AC
  Administered 2015-02-10: 1 g via INTRAMUSCULAR
  Filled 2015-02-10: qty 10

## 2015-02-10 NOTE — ED Notes (Signed)
Pt states that she began feeling bad yesterday; pt states that she has dry cough, congestion and that the coughing has caused her head to hurt; pt c/o generalized body aches especially to lower back; pt states that she feels weak all over

## 2015-02-10 NOTE — Discharge Instructions (Signed)

## 2015-02-10 NOTE — ED Provider Notes (Signed)
CSN: [id number]     Arrival date & time 02/10/15  8299 History   First MD Initiated Contact with Patient 02/10/15 0645     Chief Complaint  Patient presents with  . Generalized Body Aches     (Consider location/radiation/quality/duration/timing/severity/associated sxs/prior Treatment) Patient is a 39 y.o. female presenting with general illness.  Illness Location:  Generalized Quality:  Myalgias, cough Severity:  Moderate Onset quality:  Gradual Duration:  1 day Timing:  Constant Progression:  Unchanged Chronicity:  New Context:  Spontaneous, did not receive flu shot due to allergy Relieved by:  Nothing Worsened by:  Nothing Associated symptoms: cough, fever and nausea   Associated symptoms: no abdominal pain, no chest pain, no diarrhea, no loss of consciousness and no vomiting     Past Medical History  Diagnosis Date  . Diabetes mellitus type II 08/2009 dx  . Morbid obesity ([place])     s/p lab band 09/2010  . Knee pain, right   . Asthma   . Allergic rhinitis, cause unspecified   . Ovarian cyst     right s/p resection June 2013  . Hypertension   . Endometriosis    Past Surgical History  Procedure Laterality Date  . Laparoscopic gastric banding  10/02/2010    start weight 349#  . Salpingoophorectomy  10/08/11    right  . Abdominal hysterectomy  05/14/12    [place]   Family History  Problem Relation Age of Onset  . Heart disease Mother   . Hypertension Mother   . Stroke Mother   . Diabetes Father    Social History  Substance Use Topics  . Smoking status: Never Smoker   . Smokeless tobacco: Never Used  . Alcohol Use: No   OB History    No data available     Review of Systems  Constitutional: Positive for fever.  Respiratory: Positive for cough.   Cardiovascular: Negative for chest pain.  Gastrointestinal: Positive for nausea. Negative for vomiting, abdominal pain and diarrhea.  Neurological: Negative for loss of consciousness.  All other  systems reviewed and are negative.     Allergies  Influenza vaccines and Morphine  Home Medications   Prior to Admission medications   Medication Sig Start Date End Date Taking? Authorizing Provider  albuterol (PROAIR HFA) 108 (90 BASE) MCG/ACT inhaler Inhale 2 puffs into the lungs every 4 (four) hours as needed. Patient taking differently: Inhale 2 puffs into the lungs every 4 (four) hours as needed for wheezing or shortness of breath.  05/13/12  Yes Rowe Clack, MD  albuterol (PROVENTIL) (2.5 MG/3ML) 0.083% nebulizer solution Take 2.5 mg by nebulization every 6 (six) hours as needed for wheezing or shortness of breath.   Yes Historical Provider, MD  fexofenadine (ALLEGRA) 180 MG tablet Take 180 mg by mouth daily as needed. For seasonal allergies   Yes Historical Provider, MD  Fluticasone-Salmeterol (ADVAIR DISKUS) 250-50 MCG/DOSE AEPB Inhale 1 puff into the lungs 2 (two) times daily. 07/22/11  Yes Rowe Clack, MD  ibuprofen (ADVIL,MOTRIN) 800 MG tablet Take 1 tablet (800 mg total) by mouth every 8 (eight) hours as needed. 10/19/14  Yes Rowe Clack, MD  losartan (COZAAR) 100 MG tablet Take 1 tablet (100 mg total) by mouth daily. 05/30/14  Yes Rowe Clack, MD  mometasone (NASONEX) 50 MCG/ACT nasal spray Place 2 sprays into the nose daily as needed. For nasal spray   Yes Historical Provider, MD  norethindrone (AYGESTIN) 5 MG tablet Take  1 by mouth daily 07/06/13  Yes Historical Provider, MD  olopatadine (PATANOL) 0.1 % ophthalmic solution Place 1 drop into both eyes 2 (two) times daily. 08/08/12  Yes Darlyne Russian, MD  sitaGLIPtin (JANUVIA) 100 MG tablet Take 0.5 tablets (50 mg total) by mouth daily. Patient taking differently: Take 100 mg by mouth daily.  08/08/14  Yes Rowe Clack, MD  azithromycin (ZITHROMAX) 250 MG tablet Take 1 tablet (250 mg total) by mouth daily. Starting tomorrow take 1 pill by mouth daily 02/10/15   Debby Freiberg, MD  blood glucose meter kit  and supplies Dispense based on patient and insurance preference. Use up to four times daily as directed. (FOR ICD-9 250.00, 250.01). 06/03/14   Rowe Clack, MD  glucose blood (ONE TOUCH ULTRA TEST) test strip Use as directed 10/28/13   Rowe Clack, MD  montelukast (SINGULAIR) 10 MG tablet Take 1 tablet (10 mg total) by mouth at bedtime. Patient not taking: Reported on 02/10/2015 08/08/12   Darlyne Russian, MD  naproxen (NAPROSYN) 500 MG tablet Take 1 tablet (500 mg total) by mouth 2 (two) times daily. Patient not taking: Reported on 02/10/2015 01/16/15   Delos Haring, PA-C   BP 176/93 mmHg  Pulse 66  Temp(Src) 101.5 F (38.6 C) (Oral)  Resp 18  SpO2 100%  LMP 07/28/2011 Physical Exam  Constitutional: She is oriented to person, place, and time. She appears well-developed and well-nourished.  HENT:  Head: Normocephalic and atraumatic.  Right Ear: External ear normal.  Left Ear: External ear normal.  Eyes: Conjunctivae and EOM are normal. Pupils are equal, round, and reactive to light.  Neck: Normal range of motion. Neck supple.  Cardiovascular: Normal rate, regular rhythm, normal heart sounds and intact distal pulses.   Pulmonary/Chest: Effort normal and breath sounds normal.  Abdominal: Soft. Bowel sounds are normal. There is no tenderness.  Musculoskeletal: Normal range of motion.  Neurological: She is alert and oriented to person, place, and time.  Skin: Skin is warm and dry.  Vitals reviewed.   ED Course  Procedures (including critical care time) Labs Review Labs Reviewed  BASIC METABOLIC PANEL - Abnormal; Notable for the following:    Glucose, Bld 226 (*)    Calcium 8.6 (*)    All other components within normal limits  URINALYSIS, ROUTINE W REFLEX MICROSCOPIC (NOT AT [facility]) - Abnormal; Notable for the following:    APPearance CLOUDY (*)    Glucose, UA >1000 (*)    Leukocytes, UA SMALL (*)    All other components within normal limits  URINE MICROSCOPIC-ADD ON -  Abnormal; Notable for the following:    Squamous Epithelial / LPF FEW (*)    All other components within normal limits  CBG MONITORING, ED - Abnormal; Notable for the following:    Glucose-Capillary 232 (*)    All other components within normal limits  CBC WITH DIFFERENTIAL/PLATELET    Imaging Review Dg Chest 2 View  02/10/2015  CLINICAL DATA:  Cough. EXAM: CHEST  2 VIEW COMPARISON:  02/28/2011 chest radiograph FINDINGS: Low lung volumes. Stable cardiomediastinal silhouette with top-normal heart size. No pneumothorax. No pleural effusion. There is patchy right lower lobe consolidation. No pulmonary edema. Partially visualized is an laparoscopic gastric band with tubing extending into the ventral mid abdomen. IMPRESSION: Patchy right lower lobe consolidation, most in keeping with a pneumonia. Recommend posttreatment follow-up PA and lateral chest radiographs in 6-8 weeks. Electronically Signed   By: Janina Mayo.D.  On: 02/10/2015 07:45   I have personally reviewed and evaluated these images and lab results as part of my medical decision-making.   EKG Interpretation None      MDM   Final diagnoses:  Community acquired pneumonia    39 y.o. female with pertinent PMH of DM, obesity, endometriosis presents with generalized myalgias, cough.  Also febrile to 100.9 PTA.  Physical exam benign.  Pt resting comfortably on my exam.  Wu as above significant for PNA, CAP.  Given rocephin and azithromycin here, and will take azithro at home.  Standard return precautions given.  DC home in stable condition.I have reviewed all laboratory and imaging studies if ordered as above  1. Community acquired pneumonia         Debby Freiberg, MD 02/10/15 [phone]

## 2015-02-10 NOTE — ED Notes (Signed)
MD at bedside. 

## 2015-02-20 ENCOUNTER — Ambulatory Visit: Payer: BC Managed Care – PPO | Admitting: Internal Medicine

## 2015-03-02 ENCOUNTER — Encounter: Payer: Self-pay | Admitting: Family

## 2015-03-02 ENCOUNTER — Ambulatory Visit (INDEPENDENT_AMBULATORY_CARE_PROVIDER_SITE_OTHER): Payer: BC Managed Care – PPO | Admitting: Family

## 2015-03-02 VITALS — BP 140/76 | HR 68 | Temp 98.9°F | Resp 18 | Ht 68.0 in | Wt 323.0 lb

## 2015-03-02 DIAGNOSIS — R05 Cough: Secondary | ICD-10-CM | POA: Insufficient documentation

## 2015-03-02 DIAGNOSIS — R059 Cough, unspecified: Secondary | ICD-10-CM

## 2015-03-02 MED ORDER — FLUTICASONE FUROATE-VILANTEROL 100-25 MCG/INH IN AEPB: 1.0000 | INHALATION_SPRAY | Freq: Every day | RESPIRATORY_TRACT | Status: DC

## 2015-03-02 MED ORDER — BENZONATATE 100 MG PO CAPS
100.0000 mg | ORAL_CAPSULE | Freq: Three times a day (TID) | ORAL | Status: DC | PRN
Start: 2015-03-02 — End: 2015-04-18

## 2015-03-02 MED ORDER — PREDNISONE 20 MG PO TABS: 20.0000 mg | ORAL_TABLET | Freq: Two times a day (BID) | ORAL | Status: DC

## 2015-03-02 NOTE — Progress Notes (Signed)
Subjective:    Patient ID: Debra [name], female    DOB: Sep 13, 1975, 38 y.o.   MRN: [id number]  Chief Complaint  Patient presents with  . Cough    was in the hospital a couple of weeks ago and still has a bad cough, and gets really hot    HPI:  Debra [name] is a 39 y.o. female who  has a past medical history of Diabetes mellitus type II (08/2009 dx); Morbid obesity ([place]); Knee pain, right; Asthma; Allergic rhinitis, cause unspecified; Ovarian cyst; Hypertension; and Endometriosis. and presents today for an acute office visit.   Associated symptom of a cough has been going on for approximately 3 weeks. Recently treated in the ED for similar symptoms and found to have PNA.She was treated with azithromycin and [place].  ED records and imaging were reviewed.   Reports that the fever and the weakness is gone however the cough continues as well as some mild upper back discomfort. Modifying factors include her albuterol inhaler which has helped some but for a short period of time. Severity of the of symptoms interrupts her sleep.   Allergies  Allergen Reactions  . Influenza Vaccines   . Morphine Hives     Current Outpatient Prescriptions on File Prior to Visit  Medication Sig Dispense Refill  . albuterol (PROAIR HFA) 108 (90 BASE) MCG/ACT inhaler Inhale 2 puffs into the lungs every 4 (four) hours as needed. (Patient taking differently: Inhale 2 puffs into the lungs every 4 (four) hours as needed for wheezing or shortness of breath. ) 18 g 2  . albuterol (PROVENTIL) (2.5 MG/3ML) 0.083% nebulizer solution Take 2.5 mg by nebulization every 6 (six) hours as needed for wheezing or shortness of breath.    . blood glucose meter kit and supplies Dispense based on patient and insurance preference. Use up to four times daily as directed. (FOR ICD-9 250.00, 250.01). 1 each 0  . fexofenadine (ALLEGRA) 180 MG tablet Take 180 mg by mouth daily as needed. For seasonal allergies    .  Fluticasone-Salmeterol (ADVAIR DISKUS) 250-50 MCG/DOSE AEPB Inhale 1 puff into the lungs 2 (two) times daily. 1 each 3  . glucose blood (ONE TOUCH ULTRA TEST) test strip Use as directed 100 each 2  . ibuprofen (ADVIL,MOTRIN) 800 MG tablet Take 1 tablet (800 mg total) by mouth every 8 (eight) hours as needed. 90 tablet 1  . losartan (COZAAR) 100 MG tablet Take 1 tablet (100 mg total) by mouth daily. 90 tablet 1  . mometasone (NASONEX) 50 MCG/ACT nasal spray Place 2 sprays into the nose daily as needed. For nasal spray    . naproxen (NAPROSYN) 500 MG tablet Take 1 tablet (500 mg total) by mouth 2 (two) times daily. 30 tablet 0  . norethindrone (AYGESTIN) 5 MG tablet Take 1 by mouth daily    . olopatadine (PATANOL) 0.1 % ophthalmic solution Place 1 drop into both eyes 2 (two) times daily. 5 mL 12  . sitaGLIPtin (JANUVIA) 100 MG tablet Take 0.5 tablets (50 mg total) by mouth daily. (Patient taking differently: Take 100 mg by mouth daily. ) 90 tablet 3   No current facility-administered medications on file prior to visit.      Review of Systems  Constitutional: Negative for fever and chills.  HENT: Positive for congestion.   Respiratory: Positive for cough, chest tightness and shortness of breath.   Cardiovascular: Negative for chest pain, palpitations and leg swelling.      Objective:  BP 140/76 mmHg  Pulse 68  Temp(Src) 98.9 F (37.2 C) (Oral)  Resp 18  Ht '5\' 8"'  (1.727 m)  Wt 323 lb (146.512 kg)  BMI 49.12 kg/m2  SpO2 98%  LMP 07/28/2011 Nursing note and vital signs reviewed.  Physical Exam  Constitutional: She is oriented to person, place, and time. She appears well-developed and well-nourished. No distress.  Cardiovascular: Normal rate, regular rhythm, normal heart sounds and intact distal pulses.   Pulmonary/Chest: Effort normal and breath sounds normal. She has no wheezes. She has no rales. She exhibits no tenderness.  Neurological: She is alert and oriented to person,  place, and time.  Skin: Skin is warm and dry.  Psychiatric: She has a normal mood and affect. Her behavior is normal. Judgment and thought content normal.       Assessment & Plan:   Problem List Items Addressed This Visit      Other   Cough - Primary    Post-PNA cough. Pulmonary exam is otherwise benign with no fever. Start prednisone. Start tessalon. Sample of Breo provided. Continue asthma medications as prescribed. Follow up as scheduled in 2 weeks for CXR or sooner if symptoms worsen or fail to improve.       Relevant Medications   predniSONE (DELTASONE) 20 MG tablet   Fluticasone Furoate-Vilanterol (BREO ELLIPTA) 100-25 MCG/INH AEPB   benzonatate (TESSALON) 100 MG capsule

## 2015-03-02 NOTE — Progress Notes (Signed)
Pre visit review using our clinic review tool, if applicable. No additional management support is needed unless otherwise documented below in the visit note. 

## 2015-03-02 NOTE — Assessment & Plan Note (Signed)
Post-PNA cough. Pulmonary exam is otherwise benign with no fever. Start prednisone. Start tessalon. Sample of Breo provided. Continue asthma medications as prescribed. Follow up as scheduled in 2 weeks for CXR or sooner if symptoms worsen or fail to improve.

## 2015-03-02 NOTE — Patient Instructions (Signed)
Thank you for choosing [facility].  Summary/Instructions:  Your prescription(s) have been submitted to your pharmacy or been printed and provided for you. Please take as directed and contact our office if you believe you are having problem(s) with the medication(s) or have any questions.  If your symptoms worsen or fail to improve, please contact our office for further instruction, or in case of emergency go directly to the emergency room at the closest medical facility.     

## 2015-03-14 ENCOUNTER — Encounter: Payer: Self-pay | Admitting: Internal Medicine

## 2015-03-14 ENCOUNTER — Ambulatory Visit (INDEPENDENT_AMBULATORY_CARE_PROVIDER_SITE_OTHER)
Admission: RE | Admit: 2015-03-14 | Discharge: 2015-03-14 | Disposition: A | Discharge status: Home / Self Care | Payer: BC Managed Care – PPO | Source: Ambulatory Visit | Attending: Internal Medicine | Admitting: Internal Medicine

## 2015-03-14 ENCOUNTER — Ambulatory Visit (INDEPENDENT_AMBULATORY_CARE_PROVIDER_SITE_OTHER): Payer: BC Managed Care – PPO | Admitting: Internal Medicine

## 2015-03-14 VITALS — BP 142/90 | HR 61 | Temp 98.5°F | Resp 14 | Ht 68.0 in | Wt 325.0 lb

## 2015-03-14 DIAGNOSIS — J189 Pneumonia, unspecified organism: Secondary | ICD-10-CM

## 2015-03-14 DIAGNOSIS — J4530 Mild persistent asthma, uncomplicated: Secondary | ICD-10-CM | POA: Diagnosis not present

## 2015-03-14 NOTE — Progress Notes (Signed)
Pre visit review using our clinic review tool, if applicable. No additional management support is needed unless otherwise documented below in the visit note. 

## 2015-03-14 NOTE — Patient Instructions (Signed)
We will check the chest x-ray today to make sure the pneumonia is gone. We will call you back with the results.

## 2015-03-14 NOTE — Progress Notes (Signed)
   Subjective:    Patient ID: Debra [name], female    DOB: 08/25/1975, 39 y.o.   MRN: [id number]  HPI The patient is a 39 YO female coming in for follow up of her pneumonia. She has finished antibiotics. She did have lingering cough which was helped by some tessalon perles. Cough is about 90% gone now about 1 month out. No more SOB and minimal nose congestion. She did have elevated sugars on all the steroids for the breathing but is back down to normal with that as well now. She drank extra water and watched her diet more closely.   Review of Systems  Constitutional: Negative for fever, activity change, appetite change, fatigue and unexpected weight change.  HENT: Negative.   Eyes: Negative.   Respiratory: Positive for cough. Negative for chest tightness, shortness of breath and wheezing.        Minimal compared to before.   Cardiovascular: Negative for chest pain, palpitations and leg swelling.  Gastrointestinal: Negative for nausea, abdominal pain, diarrhea, constipation and abdominal distention.  Musculoskeletal: Negative.   Psychiatric/Behavioral: Negative.       Objective:   Physical Exam  Constitutional: She is oriented to person, place, and time. She appears well-developed and well-nourished.  Overweight  HENT:  Head: Normocephalic and atraumatic.  Eyes: EOM are normal.  Neck: Normal range of motion.  Cardiovascular: Normal rate and regular rhythm.   Pulmonary/Chest: Effort normal and breath sounds normal. No respiratory distress. She has no wheezes. She has no rales.  Abdominal: Soft. Bowel sounds are normal.  Neurological: She is alert and oriented to person, place, and time. Coordination normal.  Skin: Skin is warm and dry.   Filed Vitals:   03/14/15 1543  BP: 142/90  Pulse: 61  Temp: 98.5 F (36.9 C)  TempSrc: Oral  Resp: 14  Height: 5\' 8"  (1.727 m)  Weight: 325 lb (147.419 kg)  SpO2: 98%     Assessment & Plan:

## 2015-03-14 NOTE — Assessment & Plan Note (Signed)
She is off prednisone now and using breo for the breathing. Less albuterol usage now that she is recovering. Checking CXR today to follow up on her pneumonia. Lungs sound good on exam today.

## 2015-04-18 ENCOUNTER — Ambulatory Visit (INDEPENDENT_AMBULATORY_CARE_PROVIDER_SITE_OTHER)
Admission: RE | Admit: 2015-04-18 | Discharge: 2015-04-18 | Disposition: A | Discharge status: Home / Self Care | Payer: BC Managed Care – PPO | Source: Ambulatory Visit | Attending: Internal Medicine | Admitting: Internal Medicine

## 2015-04-18 ENCOUNTER — Encounter: Payer: Self-pay | Admitting: Internal Medicine

## 2015-04-18 ENCOUNTER — Other Ambulatory Visit (INDEPENDENT_AMBULATORY_CARE_PROVIDER_SITE_OTHER): Payer: BC Managed Care – PPO

## 2015-04-18 ENCOUNTER — Ambulatory Visit (INDEPENDENT_AMBULATORY_CARE_PROVIDER_SITE_OTHER): Payer: BC Managed Care – PPO | Admitting: Internal Medicine

## 2015-04-18 VITALS — BP 138/84 | HR 58 | Temp 98.1°F | Ht 68.0 in | Wt 327.0 lb

## 2015-04-18 DIAGNOSIS — R091 Pleurisy: Secondary | ICD-10-CM | POA: Diagnosis not present

## 2015-04-18 DIAGNOSIS — E118 Type 2 diabetes mellitus with unspecified complications: Secondary | ICD-10-CM

## 2015-04-18 DIAGNOSIS — IMO0002 Reserved for concepts with insufficient information to code with codable children: Secondary | ICD-10-CM

## 2015-04-18 DIAGNOSIS — Z0000 Encounter for general adult medical examination without abnormal findings: Secondary | ICD-10-CM | POA: Diagnosis not present

## 2015-04-18 DIAGNOSIS — Z23 Encounter for immunization: Secondary | ICD-10-CM

## 2015-04-18 DIAGNOSIS — E1165 Type 2 diabetes mellitus with hyperglycemia: Secondary | ICD-10-CM | POA: Diagnosis not present

## 2015-04-18 DIAGNOSIS — I10 Essential (primary) hypertension: Secondary | ICD-10-CM | POA: Diagnosis not present

## 2015-04-18 LAB — MICROALBUMIN / CREATININE URINE RATIO
Creatinine,U: 252.1 mg/dL
MICROALB UR: 1 mg/dL (ref 0.0–1.9)
Microalb Creat Ratio: 0.4 mg/g (ref 0.0–30.0)

## 2015-04-18 LAB — CBC WITH DIFFERENTIAL/PLATELET
BASOS PCT: 0.5 % (ref 0.0–3.0)
Basophils Absolute: 0 10*3/uL (ref 0.0–0.1)
EOS PCT: 1.2 % (ref 0.0–5.0)
Eosinophils Absolute: 0.1 10*3/uL (ref 0.0–0.7)
HCT: 42.4 % (ref 36.0–46.0)
HEMOGLOBIN: 13.4 g/dL (ref 12.0–15.0)
LYMPHS ABS: 1.8 10*3/uL (ref 0.7–4.0)
Lymphocytes Relative: 18.3 % (ref 12.0–46.0)
MCHC: 31.7 g/dL (ref 30.0–36.0)
MCV: 81.2 fl (ref 78.0–100.0)
MONOS PCT: 5 % (ref 3.0–12.0)
Monocytes Absolute: 0.5 10*3/uL (ref 0.1–1.0)
Neutro Abs: 7.4 10*3/uL (ref 1.4–7.7)
Neutrophils Relative %: 75 % (ref 43.0–77.0)
Platelets: 326 10*3/uL (ref 150.0–400.0)
RBC: 5.22 Mil/uL — AB (ref 3.87–5.11)
RDW: 14.4 % (ref 11.5–15.5)
WBC: 9.9 10*3/uL (ref 4.0–10.5)

## 2015-04-18 LAB — BASIC METABOLIC PANEL
BUN: 9 mg/dL (ref 6–23)
CHLORIDE: 106 meq/L (ref 96–112)
CO2: 26 mEq/L (ref 19–32)
Calcium: 8.8 mg/dL (ref 8.4–10.5)
Creatinine, Ser: 0.87 mg/dL (ref 0.40–1.20)
GFR: 92.29 mL/min (ref >60.00)
GLUCOSE: 158 mg/dL — AB (ref 70–99)
POTASSIUM: 3.5 meq/L (ref 3.5–5.1)
SODIUM: 140 meq/L (ref 135–145)

## 2015-04-18 LAB — LIPID PANEL
CHOL/HDL RATIO: 4
Cholesterol: 163 mg/dL (ref 0–200)
HDL: 37.8 mg/dL — AB (ref >39.00)
LDL Cholesterol: 114 mg/dL — ABNORMAL HIGH (ref 0–99)
NONHDL: 124.91
Triglycerides: 56 mg/dL (ref 0.0–149.0)
VLDL: 11.2 mg/dL (ref 0.0–40.0)

## 2015-04-18 LAB — HEPATIC FUNCTION PANEL
ALBUMIN: 3.7 g/dL (ref 3.5–5.2)
ALT: 13 U/L (ref 0–35)
AST: 9 U/L (ref 0–37)
Alkaline Phosphatase: 66 U/L (ref 39–117)
Bilirubin, Direct: 0.1 mg/dL (ref 0.0–0.3)
Total Bilirubin: 0.3 mg/dL (ref 0.2–1.2)
Total Protein: 6.6 g/dL (ref 6.0–8.3)

## 2015-04-18 LAB — HEMOGLOBIN A1C: Hgb A1c MFr Bld: 8.7 % — ABNORMAL HIGH (ref 4.6–6.5)

## 2015-04-18 LAB — TSH: TSH: 2.54 u[IU]/mL (ref 0.35–4.50)

## 2015-04-18 MED ORDER — SITAGLIPTIN PHOSPHATE 100 MG PO TABS: 100.0000 mg | ORAL_TABLET | Freq: Every day | ORAL | Status: DC

## 2015-04-18 MED ORDER — METHOCARBAMOL 500 MG PO TABS: 500.0000 mg | ORAL_TABLET | Freq: Four times a day (QID) | ORAL | Status: DC | PRN

## 2015-04-18 MED ORDER — ALBUTEROL SULFATE HFA 108 (90 BASE) MCG/ACT IN AERS: 2.0000 | INHALATION_SPRAY | RESPIRATORY_TRACT | Status: DC | PRN

## 2015-04-18 NOTE — Assessment & Plan Note (Signed)
strong FH same started ARB early 08/2011 added HCTZ late 08/2011 - changed to combo 12/2011, but pt stopped same due to dislike diuretic Resumed HCTZ 25 qAM 12/2012, but frequent noncompliance with same so DC'd from med list  BP Readings from Last 3 Encounters:  04/18/15 138/84  03/14/15 142/90  03/02/15 140/76

## 2015-04-18 NOTE — Progress Notes (Signed)
Subjective:    Patient ID: Debra [name], female    DOB: 06/22/1975, 39 y.o.   MRN: [id number]  HPI  patient is here today for annual physical. Patient feels well overall Also reviewed chronic medical conditions, interval events and current concerns   Past Medical History  Diagnosis Date  . Diabetes mellitus type II 08/2009 dx  . Morbid obesity ([facility])     s/p lab band 09/2010  . Knee pain, right     DJD, post traumatic with repeat falls  . Asthma   . Allergic rhinitis, cause unspecified   . Ovarian cyst     right s/p resection June 2013  . Hypertension   . Endometriosis    Family History  Problem Relation Age of Onset  . Heart disease Mother   . Hypertension Mother   . Stroke Father 3845  . Diabetes Father   . Diabetes Mother    Social History  Substance Use Topics  . Smoking status: Never Smoker   . Smokeless tobacco: Never Used  . Alcohol Use: No    Review of Systems  Constitutional: Negative for fever, activity change, appetite change, fatigue and unexpected weight change.  Respiratory: Negative for cough, shortness of breath and wheezing.        Pain breathing L scapula area x 24h -?recurrent PNA sx or muscle pull  Cardiovascular: Negative for chest pain, palpitations and leg swelling.  Gastrointestinal: Negative for nausea, abdominal pain and diarrhea.  Musculoskeletal: Positive for arthralgias.  Neurological: Negative for dizziness, weakness, light-headedness and headaches.  Psychiatric/Behavioral: Negative for dysphoric mood. The patient is not nervous/anxious.   All other systems reviewed and are negative.      Objective:    Physical Exam  Constitutional: She is oriented to person, place, and time. She appears well-developed and well-nourished. No distress.  obese  HENT:  Head: Normocephalic and atraumatic.  Right Ear: External ear normal.  Left Ear: External ear normal.  Nose: Nose normal.  Mouth/Throat: Oropharynx is clear and moist. No  oropharyngeal exudate.  Eyes: EOM are normal. Pupils are equal, round, and reactive to light. Right eye exhibits no discharge. Left eye exhibits no discharge. No scleral icterus.  Neck: Normal range of motion. Neck supple. No JVD present. No tracheal deviation present. No thyromegaly present.  Cardiovascular: Normal rate, regular rhythm, normal heart sounds and intact distal pulses.  Exam reveals no friction rub.   No murmur heard. Pulmonary/Chest: Effort normal and breath sounds normal. No respiratory distress. She has no wheezes. She has no rales. She exhibits no tenderness.  Abdominal: Soft. Bowel sounds are normal. She exhibits no distension and no mass. There is no tenderness. There is no rebound and no guarding.  Musculoskeletal: Normal range of motion.  Lymphadenopathy:    She has no cervical adenopathy.  Neurological: She is alert and oriented to person, place, and time. She has normal reflexes. No cranial nerve deficit.  Skin: Skin is warm and dry. No rash noted. She is not diaphoretic. No erythema.  Psychiatric: She has a normal mood and affect. Her behavior is normal. Judgment and thought content normal.  Nursing note and vitals reviewed.   BP 138/84 mmHg  Pulse 58  Temp(Src) 98.1 F (36.7 C) (Oral)  Ht 5\' 8"  (1.727 m)  Wt 327 lb (148.326 kg)  BMI 49.73 kg/m2  SpO2 98%  LMP 07/28/2011 Wt Readings from Last 3 Encounters:  04/18/15 327 lb (148.326 kg)  03/14/15 325 lb (147.419 kg)  03/02/15 323  lb (146.512 kg)    Lab Results  Component Value Date   WBC 8.4 02/10/2015   HGB 13.0 02/10/2015   HCT 40.4 02/10/2015   PLT 244 02/10/2015   GLUCOSE 226* 02/10/2015   CHOL 141 11/10/2013   TRIG 44.0 11/10/2013   HDL 33.70* 11/10/2013   LDLCALC 99 11/10/2013   ALT 14 07/22/2013   AST 14 07/22/2013   NA 136 02/10/2015   K 3.6 02/10/2015   CL 107 02/10/2015   CREATININE 0.82 02/10/2015   BUN 7 02/10/2015   CO2 24 02/10/2015   TSH 1.66 07/22/2013   HGBA1C 8.7* 10/18/2014    MICROALBUR 1.7 10/18/2014    Dg Chest 2 View  03/14/2015  CLINICAL DATA:  Pneumonia . EXAM: CHEST  2 VIEW COMPARISON:  02/10/2015 . FINDINGS: Mediastinum and hilar structures normal. Cardiomegaly with normal pulmonary vascularity. No focal pulmonary infiltrate. Interim clearing of right lung base infiltrate . No pleural effusion or pneumothorax. IMPRESSION: Interim clearing of right lung base infiltrate. Electronically Signed   By: Maisie Fus  Register   On: 03/14/2015 16:46       Assessment & Plan:   CPX/z00.00 - Patient has been counseled on age-appropriate routine health concerns for screening and prevention. These are reviewed and up-to-date. Immunizations are up-to-date or declined. Labs and  reviewed.  R pleurisy x 24h - no cough or fever but reports 24h posterior chest pain similar to prior PNA symptoms in 01/2015 - check CXR and rx muscle relaxant to take with NSAIDs - abx if CXR infiltrate  Problem List Items Addressed This Visit    Diabetes mellitus type 2, uncontrolled ([facility])    "diet controlled" until 12/2011, off all meds following lap band 09/2010  Then metformin resumed 12/2011 due to a1c>7   solo metformin stopped due to GI side effects so started Januvia 06/2012 Brief trial glipizide 08/2012 poorly tolerated so elected to resume Januvia summer 2015 Now taking full dose as prescribed since fall 2016 so expect improved a1c Check a1c now, titrate as needed pt will call if cbg>250 or <100  Lab Results  Component Value Date   HGBA1C 8.7* 10/18/2014        Relevant Medications   sitaGLIPtin (JANUVIA) 100 MG tablet   Other Relevant Orders   Basic metabolic panel   Lipid panel   Hemoglobin A1c   Microalbumin / creatinine urine ratio   Hypertension    strong FH same started ARB early 08/2011 added HCTZ late 08/2011 - changed to combo 12/2011, but pt stopped same due to dislike diuretic Resumed HCTZ 25 qAM 12/2012, but frequent noncompliance with same so DC'd from med list  BP  Readings from Last 3 Encounters:  04/18/15 138/84  03/14/15 142/90  03/02/15 140/76        Morbid obesity ([facility])    Working on weight loss - diet, swimming and gym S/p Lap band 10/02/10 - start weight 349# encouragement provided  Wt Readings from Last 3 Encounters:  04/18/15 327 lb (148.326 kg)  03/14/15 325 lb (147.419 kg)  03/02/15 323 lb (146.512 kg)        Relevant Medications   sitaGLIPtin (JANUVIA) 100 MG tablet    Other Visit Diagnoses    Routine general medical examination at a health care facility    -  Primary    Relevant Orders    Basic metabolic panel    CBC with Differential/Platelet    Hepatic function panel    Lipid panel    TSH  Pleurisy        Relevant Orders    DG Chest 2 View        Rene Paci, MD

## 2015-04-18 NOTE — Patient Instructions (Addendum)
It was good to see you today.  We have reviewed your prior records including labs and tests today  Prevnar 13 second pneumonia vaccine updated today. Other Health Maintenance reviewed - all other recommended immunizations and age-appropriate screenings are up-to-date.  Test(s) ordered today. Your results will be released to [place] (or called to you) after review, usually within 72hours after test completion. If any changes need to be made, you will be notified at that same time.  Medications reviewed and updated. Use generic Robaxin muscle relaxer as needed for shoulder blade pain No other changes recommended at this time.  Your prescription(s) have been submitted to your pharmacy. Please take as directed and contact our office if you believe you are having problem(s) with the medication(s).  Work on lifestyle changes as discussed (low fat, low carb, increased protein diet; improved exercise efforts; weight loss) to control sugar, blood pressure and cholesterol levels and/or reduce risk of developing other medical problems. Look into [email or web address] or other type of food journal to assist you in this process.  Please schedule followup in 6 months for semiannual diabetes exam and labs, call sooner if problems.  Health Maintenance, Female Adopting a healthy lifestyle and getting preventive care can go a long way to promote health and wellness. Talk with your health care provider about what schedule of regular examinations is right for you. This is a good chance for you to check in with your provider about disease prevention and staying healthy. In between checkups, there are plenty of things you can do on your own. Experts have done a lot of research about which lifestyle changes and preventive measures are most likely to keep you healthy. Ask your health care provider for more information. WEIGHT AND DIET  Eat a healthy diet  Be sure to include plenty of vegetables, fruits, low-fat dairy  products, and lean protein.  Do not eat a lot of foods high in solid fats, added sugars, or salt.  Get regular exercise. This is one of the most important things you can do for your health.  Most adults should exercise for at least 150 minutes each week. The exercise should increase your heart rate and make you sweat (moderate-intensity exercise).  Most adults should also do strengthening exercises at least twice a week. This is in addition to the moderate-intensity exercise.  Maintain a healthy weight  Body mass index (BMI) is a measurement that can be used to identify possible weight problems. It estimates body fat based on height and weight. Your health care provider can help determine your BMI and help you achieve or maintain a healthy weight.  For females 27 years of age and older:   A BMI below 18.5 is considered underweight.  A BMI of 18.5 to 24.9 is normal.  A BMI of 25 to 29.9 is considered overweight.  A BMI of 30 and above is considered obese.  Watch levels of cholesterol and blood lipids  You should start having your blood tested for lipids and cholesterol at 40 years of age, then have this test every 5 years.  You may need to have your cholesterol levels checked more often if:  Your lipid or cholesterol levels are high.  You are older than 40 years of age.  You are at high risk for heart disease.  CANCER SCREENING   Lung Cancer  Lung cancer screening is recommended for adults 74-52 years old who are at high risk for lung cancer because of a history of smoking.  A yearly low-dose CT scan of the lungs is recommended for people who:  Currently smoke.  Have quit within the past 15 years.  Have at least a 30-pack-year history of smoking. A pack year is smoking an average of one pack of cigarettes a day for 1 year.  Yearly screening should continue until it has been 15 years since you quit.  Yearly screening should stop if you develop a health problem that  would prevent you from having lung cancer treatment.  Breast Cancer  Practice breast self-awareness. This means understanding how your breasts normally appear and feel.  It also means doing regular breast self-exams. Let your health care provider know about any changes, no matter how small.  If you are in your 20s or 30s, you should have a clinical breast exam (CBE) by a health care provider every 1-3 years as part of a regular health exam.  If you are 4 or older, have a CBE every year. Also consider having a breast X-ray (mammogram) every year.  If you have a family history of breast cancer, talk to your health care provider about genetic screening.  If you are at high risk for breast cancer, talk to your health care provider about having an MRI and a mammogram every year.  Breast cancer gene (BRCA) assessment is recommended for women who have family members with BRCA-related cancers. BRCA-related cancers include:  Breast.  Ovarian.  Tubal.  Peritoneal cancers.  Results of the assessment will determine the need for genetic counseling and BRCA1 and BRCA2 testing. Cervical Cancer Your health care provider may recommend that you be screened regularly for cancer of the pelvic organs (ovaries, uterus, and vagina). This screening involves a pelvic examination, including checking for microscopic changes to the surface of your cervix (Pap test). You may be encouraged to have this screening done every 3 years, beginning at age 3.  For women ages 26-65, health care providers may recommend pelvic exams and Pap testing every 3 years, or they may recommend the Pap and pelvic exam, combined with testing for human papilloma virus (HPV), every 5 years. Some types of HPV increase your risk of cervical cancer. Testing for HPV may also be done on women of any age with unclear Pap test results.  Other health care providers may not recommend any screening for nonpregnant women who are considered low  risk for pelvic cancer and who do not have symptoms. Ask your health care provider if a screening pelvic exam is right for you.  If you have had past treatment for cervical cancer or a condition that could lead to cancer, you need Pap tests and screening for cancer for at least 20 years after your treatment. If Pap tests have been discontinued, your risk factors (such as having a new sexual partner) need to be reassessed to determine if screening should resume. Some women have medical problems that increase the chance of getting cervical cancer. In these cases, your health care provider may recommend more frequent screening and Pap tests. Colorectal Cancer  This type of cancer can be detected and often prevented.  Routine colorectal cancer screening usually begins at 40 years of age and continues through 40 years of age.  Your health care provider may recommend screening at an earlier age if you have risk factors for colon cancer.  Your health care provider may also recommend using home test kits to check for hidden blood in the stool.  A small camera at the end of a  tube can be used to examine your colon directly (sigmoidoscopy or colonoscopy). This is done to check for the earliest forms of colorectal cancer.  Routine screening usually begins at age 31.  Direct examination of the colon should be repeated every 5-10 years through 40 years of age. However, you may need to be screened more often if early forms of precancerous polyps or small growths are found. Skin Cancer  Check your skin from head to toe regularly.  Tell your health care provider about any new moles or changes in moles, especially if there is a change in a mole's shape or color.  Also tell your health care provider if you have a mole that is larger than the size of a pencil eraser.  Always use sunscreen. Apply sunscreen liberally and repeatedly throughout the day.  Protect yourself by wearing long sleeves, pants, a  wide-brimmed hat, and sunglasses whenever you are outside. HEART DISEASE, DIABETES, AND HIGH BLOOD PRESSURE   High blood pressure causes heart disease and increases the risk of stroke. High blood pressure is more likely to develop in:  People who have blood pressure in the high end of the normal range (130-139/85-89 mm Hg).  People who are overweight or obese.  People who are African American.  If you are 54-63 years of age, have your blood pressure checked every 3-5 years. If you are 20 years of age or older, have your blood pressure checked every year. You should have your blood pressure measured twice--once when you are at a hospital or clinic, and once when you are not at a hospital or clinic. Record the average of the two measurements. To check your blood pressure when you are not at a hospital or clinic, you can use:  An automated blood pressure machine at a pharmacy.  A home blood pressure monitor.  If you are between 21 years and 56 years old, ask your health care provider if you should take aspirin to prevent strokes.  Have regular diabetes screenings. This involves taking a blood sample to check your fasting blood sugar level.  If you are at a normal weight and have a low risk for diabetes, have this test once every three years after 40 years of age.  If you are overweight and have a high risk for diabetes, consider being tested at a younger age or more often. PREVENTING INFECTION  Hepatitis B  If you have a higher risk for hepatitis B, you should be screened for this virus. You are considered at high risk for hepatitis B if:  You were born in a country where hepatitis B is common. Ask your health care provider which countries are considered high risk.  Your parents were born in a high-risk country, and you have not been immunized against hepatitis B (hepatitis B vaccine).  You have HIV or AIDS.  You use needles to inject street drugs.  You live with someone who has  hepatitis B.  You have had sex with someone who has hepatitis B.  You get hemodialysis treatment.  You take certain medicines for conditions, including cancer, organ transplantation, and autoimmune conditions. Hepatitis C  Blood testing is recommended for:  Everyone born from [birth year] through 1966.  Anyone with known risk factors for hepatitis C. Sexually transmitted infections (STIs)  You should be screened for sexually transmitted infections (STIs) including gonorrhea and chlamydia if:  You are sexually active and are younger than 40 years of age.  You are older than 40 years  of age and your health care provider tells you that you are at risk for this type of infection.  Your sexual activity has changed since you were last screened and you are at an increased risk for chlamydia or gonorrhea. Ask your health care provider if you are at risk.  If you do not have HIV, but are at risk, it may be recommended that you take a prescription medicine daily to prevent HIV infection. This is called pre-exposure prophylaxis (PrEP). You are considered at risk if:  You are sexually active and do not regularly use condoms or know the HIV status of your partner(s).  You take drugs by injection.  You are sexually active with a partner who has HIV. Talk with your health care provider about whether you are at high risk of being infected with HIV. If you choose to begin PrEP, you should first be tested for HIV. You should then be tested every 3 months for as long as you are taking PrEP.  PREGNANCY   If you are premenopausal and you may become pregnant, ask your health care provider about preconception counseling.  If you may become pregnant, take 400 to 800 micrograms (mcg) of folic acid every day.  If you want to prevent pregnancy, talk to your health care provider about birth control (contraception). OSTEOPOROSIS AND MENOPAUSE   Osteoporosis is a disease in which the bones lose minerals and  strength with aging. This can result in serious bone fractures. Your risk for osteoporosis can be identified using a bone density scan.  If you are 80 years of age or older, or if you are at risk for osteoporosis and fractures, ask your health care provider if you should be screened.  Ask your health care provider whether you should take a calcium or vitamin D supplement to lower your risk for osteoporosis.  Menopause may have certain physical symptoms and risks.  Hormone replacement therapy may reduce some of these symptoms and risks. Talk to your health care provider about whether hormone replacement therapy is right for you.  HOME CARE INSTRUCTIONS   Schedule regular health, dental, and eye exams.  Stay current with your immunizations.   Do not use any tobacco products including cigarettes, chewing tobacco, or electronic cigarettes.  If you are pregnant, do not drink alcohol.  If you are breastfeeding, limit how much and how often you drink alcohol.  Limit alcohol intake to no more than 1 drink per day for nonpregnant women. One drink equals 12 ounces of beer, 5 ounces of wine, or 1 ounces of hard liquor.  Do not use street drugs.  Do not share needles.  Ask your health care provider for help if you need support or information about quitting drugs.  Tell your health care provider if you often feel depressed.  Tell your health care provider if you have ever been abused or do not feel safe at home.   This information is not intended to replace advice given to you by your health care provider. Make sure you discuss any questions you have with your health care provider.   Document Released: 10/22/2010 Document Revised: 04/29/2014 Document Reviewed: 03/10/2013 Elsevier Interactive Patient Education 2016 [employer]. Exercising to [employer] Exercising can help you to lose weight. In order to lose weight through exercise, you need to do vigorous-intensity exercise. You can  tell that you are exercising with vigorous intensity if you are breathing very hard and fast and cannot hold a conversation while exercising.  Moderate-intensity exercise helps to maintain your current weight. You can tell that you are exercising at a moderate level if you have a higher heart rate and faster breathing, but you are still able to hold a conversation. HOW OFTEN SHOULD I EXERCISE? Choose an activity that you enjoy and set realistic goals. Your health care provider can help you to make an activity plan that works for you. Exercise regularly as directed by your health care provider. This may include:  Doing resistance training twice each week, such as:  Push-ups.  Sit-ups.  Lifting weights.  Using resistance bands.  Doing a given intensity of exercise for a given amount of time. Choose from these options:  150 minutes of moderate-intensity exercise every week.  75 minutes of vigorous-intensity exercise every week.  A mix of moderate-intensity and vigorous-intensity exercise every week. Children, pregnant women, people who are out of shape, people who are overweight, and older adults may need to consult a health care provider for individual recommendations. If you have any sort of medical condition, be sure to consult your health care provider before starting a new exercise program. WHAT ARE SOME ACTIVITIES THAT CAN HELP ME TO LOSE WEIGHT?   Walking at a rate of at least 4.5 miles an hour.  Jogging or running at a rate of 5 miles per hour.  Biking at a rate of at least 10 miles per hour.  Lap swimming.  Roller-skating or in-line skating.  Cross-country skiing.  Vigorous competitive sports, such as football, basketball, and soccer.  Jumping rope.  Aerobic dancing. HOW CAN I BE MORE ACTIVE IN MY DAY-TO-DAY ACTIVITIES?  Use the stairs instead of the elevator.  Take a walk during your lunch break.  If you drive, park your car farther away from work or  school.  If you take public transportation, get off one stop early and walk the rest of the way.  Make all of your phone calls while standing up and walking around.  Get up, stretch, and walk around every 30 minutes throughout the day. WHAT GUIDELINES SHOULD I FOLLOW WHILE EXERCISING?  Do not exercise so much that you hurt yourself, feel dizzy, or get very short of breath.  Consult your health care provider prior to starting a new exercise program.  Wear comfortable clothes and shoes with good support.  Drink plenty of water while you exercise to prevent dehydration or heat stroke. Body water is lost during exercise and must be replaced.  Work out until you breathe faster and your heart beats faster.   This information is not intended to replace advice given to you by your health care provider. Make sure you discuss any questions you have with your health care provider.   Document Released: 05/11/2010 Document Revised: 04/29/2014 Document Reviewed: 09/09/2013 Elsevier Interactive Patient Education 2016 [employer]. Diabetes and Exercise Exercising regularly is important. It is not just about losing weight. It has many health benefits, such as:  Improving your overall fitness, flexibility, and endurance.  Increasing your bone density.  Helping with weight control.  Decreasing your body fat.  Increasing your muscle strength.  Reducing stress and tension.  Improving your overall health. People with diabetes who exercise gain additional benefits because exercise:  Reduces appetite.  Improves the body's use of blood sugar (glucose).  Helps lower or control blood glucose.  Decreases blood pressure.  Helps control blood lipids (such as cholesterol and triglycerides).  Improves the body's use of the hormone insulin by:  Increasing the body's  insulin sensitivity.  Reducing the body's insulin needs.  Decreases the risk for heart disease because exercising:  Lowers  cholesterol and triglycerides levels.  Increases the levels of good cholesterol (such as high-density lipoproteins [HDL]) in the body.  Lowers blood glucose levels. YOUR ACTIVITY PLAN  Choose an activity that you enjoy, and set realistic goals. To exercise safely, you should begin practicing any new physical activity slowly, and gradually increase the intensity of the exercise over time. Your health care provider or diabetes educator can help create an activity plan that works for you. General recommendations include:  Encouraging children to engage in at least 60 minutes of physical activity each day.  Stretching and performing strength training exercises, such as yoga or weight lifting, at least 2 times per week.  Performing a total of at least 150 minutes of moderate-intensity exercise each week, such as brisk walking or water aerobics.  Exercising at least 3 days per week, making sure you allow no more than 2 consecutive days to pass without exercising.  Avoiding long periods of inactivity (90 minutes or more). When you have to spend an extended period of time sitting down, take frequent breaks to walk or stretch. RECOMMENDATIONS FOR EXERCISING WITH TYPE 1 OR TYPE 2 DIABETES   Check your blood glucose before exercising. If blood glucose levels are greater than 240 mg/dL, check for urine ketones. Do not exercise if ketones are present.  Avoid injecting insulin into areas of the body that are going to be exercised. For example, avoid injecting insulin into:  The arms when playing tennis.  The legs when jogging.  Keep a record of:  Food intake before and after you exercise.  Expected peak times of insulin action.  Blood glucose levels before and after you exercise.  The type and amount of exercise you have done.  Review your records with your health care provider. Your health care provider will help you to develop guidelines for adjusting food intake and insulin amounts before  and after exercising.  If you take insulin or oral hypoglycemic agents, watch for signs and symptoms of hypoglycemia. They include:  Dizziness.  Shaking.  Sweating.  Chills.  Confusion.  Drink plenty of water while you exercise to prevent dehydration or heat stroke. Body water is lost during exercise and must be replaced.  Talk to your health care provider before starting an exercise program to make sure it is safe for you. Remember, almost any type of activity is better than none.   This information is not intended to replace advice given to you by your health care provider. Make sure you discuss any questions you have with your health care provider.   Document Released: 06/29/2003 Document Revised: 08/23/2014 Document Reviewed: 09/15/2012 Elsevier Interactive Patient Education [employer].

## 2015-04-18 NOTE — Assessment & Plan Note (Signed)
Working on weight loss - diet, swimming and gym S/p Lap band 10/02/10 - start weight 349# encouragement provided  Wt Readings from Last 3 Encounters:  04/18/15 327 lb (148.326 kg)  03/14/15 325 lb (147.419 kg)  03/02/15 323 lb (146.512 kg)

## 2015-04-18 NOTE — Progress Notes (Signed)
Pre visit review using our clinic review tool, if applicable. No additional management support is needed unless otherwise documented below in the visit note. 

## 2015-04-18 NOTE — Assessment & Plan Note (Signed)
"  diet controlled" until 12/2011, off all meds following lap band 09/2010  Then metformin resumed 12/2011 due to a1c>7   solo metformin stopped due to GI side effects so started Januvia 06/2012 Brief trial glipizide 08/2012 poorly tolerated so elected to resume Januvia summer 2015 Now taking full dose as prescribed since fall 2016 so expect improved a1c Check a1c now, titrate as needed pt will call if cbg>250 or <100  Lab Results  Component Value Date   HGBA1C 8.7* 10/18/2014

## 2015-04-19 ENCOUNTER — Other Ambulatory Visit: Payer: Self-pay | Admitting: Internal Medicine

## 2015-04-19 DIAGNOSIS — Z794 Long term (current) use of insulin: Principal | ICD-10-CM

## 2015-04-19 DIAGNOSIS — E1110 Type 2 diabetes mellitus with ketoacidosis without coma: Secondary | ICD-10-CM

## 2015-04-26 ENCOUNTER — Telehealth: Payer: Self-pay

## 2015-04-26 NOTE — Telephone Encounter (Signed)
Per last lab results: endocrinology referral recommended. Pls advise if this is okay to put in your name.

## 2015-04-26 NOTE — Telephone Encounter (Signed)
Pt currently has an appt to est with you in June.

## 2015-04-26 NOTE — Telephone Encounter (Signed)
Fine if she is planning to establish with me. Otherwise you can ask new pcp or schedule acute visit.

## 2015-05-09 ENCOUNTER — Other Ambulatory Visit: Payer: Self-pay

## 2015-05-09 DIAGNOSIS — Z1231 Encounter for screening mammogram for malignant neoplasm of breast: Secondary | ICD-10-CM

## 2015-05-10 ENCOUNTER — Ambulatory Visit: Payer: BC Managed Care – PPO | Admitting: Internal Medicine

## 2015-05-26 ENCOUNTER — Ambulatory Visit: Payer: BC Managed Care – PPO | Admitting: Internal Medicine

## 2015-06-05 ENCOUNTER — Ambulatory Visit
Admission: RE | Admit: 2015-06-05 | Discharge: 2015-06-05 | Disposition: A | Discharge status: Home / Self Care | Payer: BC Managed Care – PPO | Source: Ambulatory Visit

## 2015-06-05 DIAGNOSIS — Z1231 Encounter for screening mammogram for malignant neoplasm of breast: Secondary | ICD-10-CM

## 2015-06-08 ENCOUNTER — Other Ambulatory Visit: Payer: Self-pay | Admitting: Obstetrics and Gynecology

## 2015-06-08 DIAGNOSIS — R928 Other abnormal and inconclusive findings on diagnostic imaging of breast: Secondary | ICD-10-CM

## 2015-06-13 ENCOUNTER — Ambulatory Visit
Admission: RE | Admit: 2015-06-13 | Discharge: 2015-06-13 | Disposition: A | Discharge status: Home / Self Care | Payer: BC Managed Care – PPO | Source: Ambulatory Visit | Attending: Obstetrics and Gynecology | Admitting: Obstetrics and Gynecology

## 2015-06-13 DIAGNOSIS — R928 Other abnormal and inconclusive findings on diagnostic imaging of breast: Secondary | ICD-10-CM

## 2015-06-15 ENCOUNTER — Other Ambulatory Visit: Payer: Self-pay | Admitting: Internal Medicine

## 2015-07-07 ENCOUNTER — Encounter: Payer: Self-pay | Admitting: Internal Medicine

## 2015-07-07 ENCOUNTER — Ambulatory Visit (INDEPENDENT_AMBULATORY_CARE_PROVIDER_SITE_OTHER): Payer: BC Managed Care – PPO | Admitting: Internal Medicine

## 2015-07-07 VITALS — BP 136/92 | HR 57 | Temp 98.7°F | Resp 14 | Ht 68.0 in | Wt 324.1 lb

## 2015-07-07 DIAGNOSIS — E1165 Type 2 diabetes mellitus with hyperglycemia: Secondary | ICD-10-CM | POA: Diagnosis not present

## 2015-07-07 DIAGNOSIS — IMO0001 Reserved for inherently not codable concepts without codable children: Secondary | ICD-10-CM

## 2015-07-07 DIAGNOSIS — M25561 Pain in right knee: Secondary | ICD-10-CM

## 2015-07-07 DIAGNOSIS — I10 Essential (primary) hypertension: Secondary | ICD-10-CM | POA: Diagnosis not present

## 2015-07-07 MED ORDER — GLIPIZIDE 5 MG PO TABS: 5.0000 mg | ORAL_TABLET | Freq: Two times a day (BID) | ORAL | Status: DC

## 2015-07-07 NOTE — Patient Instructions (Signed)
We have sent in the glipizide to try for the diabetes. You take 1 pill twice a day with meals. Since it does lower the sugar you need to be sure you eat when you take it. If you do not eat it is safer to skip the dose than to take it.   Diabetes and Standards of Medical Care Diabetes is complicated. You may find that your diabetes team includes a dietitian, nurse, diabetes educator, eye doctor, and more. To help everyone know what is going on and to help you get the care you deserve, the following schedule of care was developed to help keep you on track. Below are the tests, exams, vaccines, medicines, education, and plans you will need. HbA1c test This test shows how well you have controlled your glucose over the past 2-3 months. It is used to see if your diabetes management plan needs to be adjusted.   It is performed at least 2 times a year if you are meeting treatment goals.  It is performed 4 times a year if therapy has changed or if you are not meeting treatment goals. Blood pressure test  This test is performed at every routine medical visit. The goal is less than 140/90 mm Hg for most people, but 130/80 mm Hg in some cases. Ask your health care provider about your goal. Dental exam  Follow up with the dentist regularly. Eye exam  If you are diagnosed with type 1 diabetes as a child, get an exam upon reaching the age of 66 years or older and having had diabetes for 3-5 years. Yearly eye exams are recommended after that initial eye exam.  If you are diagnosed with type 1 diabetes as an adult, get an exam within 5 years of diagnosis and then yearly.  If you are diagnosed with type 2 diabetes, get an exam as soon as possible after the diagnosis and then yearly. Foot care exam  Visual foot exams are performed at every routine medical visit. The exams check for cuts, injuries, or other problems with the feet.  You should have a complete foot exam performed every year. This exam includes  an inspection of the structure and skin of your feet, a check of the pulses in your feet, and a check of the sensation in your feet.  Type 1 diabetes: The first exam is performed 5 years after diagnosis.  Type 2 diabetes: The first exam is performed at the time of diagnosis.  Check your feet nightly for cuts, injuries, or other problems with your feet. Tell your health care provider if anything is not healing. Kidney function test (urine microalbumin)  This test is performed once a year.  Type 1 diabetes: The first test is performed 5 years after diagnosis.  Type 2 diabetes: The first test is performed at the time of diagnosis.  A serum creatinine and estimated glomerular filtration rate (eGFR) test is done once a year to assess the level of chronic kidney disease (CKD), if present. Lipid profile (cholesterol, HDL, LDL, triglycerides)  Performed every 5 years for most people.  The goal for LDL is less than 100 mg/dL. If you are at high risk, the goal is less than 70 mg/dL.  The goal for HDL is 40 mg/dL-50 mg/dL for men and 50 mg/dL-60 mg/dL for women. An HDL cholesterol of 60 mg/dL or higher gives some protection against heart disease.  The goal for triglycerides is less than 150 mg/dL. Immunizations  The flu (influenza) vaccine is recommended  yearly for every person 41 months of age or older who has diabetes.  The pneumonia (pneumococcal) vaccine is recommended for every person 41 years of age or older who has diabetes. Adults 41 years of age or older may receive the pneumonia vaccine as a series of two separate shots.  The hepatitis B vaccine is recommended for adults shortly after they have been diagnosed with diabetes.  The Tdap (tetanus, diphtheria, and pertussis) vaccine should be given:  According to normal childhood vaccination schedules, for children.  Every 10 years, for adults who have diabetes. Diabetes self-management education  Education is recommended at diagnosis  and ongoing as needed. Treatment plan  Your treatment plan is reviewed at every medical visit.   This information is not intended to replace advice given to you by your health care provider. Make sure you discuss any questions you have with your health care provider.   Document Released: 02/03/2009 Document Revised: 04/29/2014 Document Reviewed: 09/08/2012 Elsevier Interactive Patient Education [employer].

## 2015-07-07 NOTE — Assessment & Plan Note (Signed)
Add glipizide BID to her regimen of only [place]. Talked to her about risk of hypoglycemia. Invokana or similar would be ideal to add next with increased BP control and sugar control. Last HgA1c 8.7 and above goal. Not complicated but we talked about the fact that the longer she is above goal the more likely it is she will get complications. Eye exam and foot exam up to date. Taking ARB.

## 2015-07-07 NOTE — Progress Notes (Signed)
   Subjective:    Patient ID: Debra [name], female    DOB: 08/21/1975, 40 y.o.   MRN: [id number]  HPI The patient is a 40 YO female coming in for follow up of her medical problems including her diabetes (not well controlled, last HgA1c 8.7 in Dec, not complicated, on ARB), her morbid obesity (weight is down 3 pounds since last visit, hx lap band 2012, right knee pain limits her activity, weight down 30 pounds since the procedure), and her blood pressure (high today but previously controlled on her current regimen of losartan, not complicated, no side effects). She has no new concerns today. Her ongoing knee pain is the thing that is most limiting her life. She is working through [employer] and just got a second opinion. They feel they need to do a knee replacement but would like her to lose another 15 pounds which she is not sure she wants to do.   Review of Systems  Constitutional: Negative for fever, activity change, appetite change, fatigue and unexpected weight change.  HENT: Negative.   Eyes: Negative.   Respiratory: Negative for cough, chest tightness, shortness of breath and wheezing.        Minimal compared to before.   Cardiovascular: Negative for chest pain, palpitations and leg swelling.  Gastrointestinal: Negative for nausea, abdominal pain, diarrhea, constipation and abdominal distention.  Musculoskeletal: Positive for myalgias, arthralgias and gait problem. Negative for back pain.  Skin: Negative.   Neurological: Negative for dizziness, syncope, weakness, light-headedness and headaches.  Psychiatric/Behavioral: Negative.       Objective:   Physical Exam  Constitutional: She is oriented to person, place, and time. She appears well-developed and well-nourished.  Overweight  HENT:  Head: Normocephalic and atraumatic.  Eyes: EOM are normal.  Neck: Normal range of motion.  Cardiovascular: Normal rate and regular rhythm.   Pulmonary/Chest: Effort normal and breath sounds  normal. No respiratory distress. She has no wheezes. She has no rales.  Abdominal: Soft. Bowel sounds are normal. She exhibits no distension. There is no tenderness. There is no rebound.  Neurological: She is alert and oriented to person, place, and time.  Skin: Skin is warm and dry.  Psychiatric: She has a normal mood and affect.   Filed Vitals:   07/07/15 1043 07/07/15 1121  BP: 142/100 136/92  Pulse: 57   Temp: 98.7 F (37.1 C)   TempSrc: Oral   Resp: 14   Height: 5\' 8"  (1.727 m)   Weight: 324 lb 1.9 oz (147.02 kg)   SpO2: 98%       Assessment & Plan:

## 2015-07-07 NOTE — Assessment & Plan Note (Signed)
Weight is down a little from last visit and she is not sure she wants to lose more weight right now. Talked to her about the fact that her weight is causing her diabetes and blood pressure issues. She is not able to exercise right now.

## 2015-07-07 NOTE — Assessment & Plan Note (Signed)
Still going through [employer], keeping her from working and is bothering her still. Keeping her from being active and exercising as well.

## 2015-07-07 NOTE — Assessment & Plan Note (Signed)
BP above goal today and has been intermittently high in the past. She does not want to change today but if needs change would make losartan a combo with hctz. No labs today.

## 2015-07-07 NOTE — Progress Notes (Signed)
Pre visit review using our clinic review tool, if applicable. No additional management support is needed unless otherwise documented below in the visit note. 

## 2015-09-07 ENCOUNTER — Ambulatory Visit (INDEPENDENT_AMBULATORY_CARE_PROVIDER_SITE_OTHER)
Admission: RE | Admit: 2015-09-07 | Discharge: 2015-09-07 | Disposition: A | Discharge status: Home / Self Care | Payer: BC Managed Care – PPO | Source: Ambulatory Visit | Attending: Family | Admitting: Family

## 2015-09-07 ENCOUNTER — Encounter: Payer: Self-pay | Admitting: Family

## 2015-09-07 ENCOUNTER — Ambulatory Visit (INDEPENDENT_AMBULATORY_CARE_PROVIDER_SITE_OTHER): Payer: BC Managed Care – PPO | Admitting: Family

## 2015-09-07 VITALS — BP 132/88 | HR 67 | Temp 98.0°F | Ht 68.0 in | Wt 329.1 lb

## 2015-09-07 DIAGNOSIS — M79674 Pain in right toe(s): Secondary | ICD-10-CM

## 2015-09-07 NOTE — Patient Instructions (Addendum)
Xray to look for fracture. If no fracture, ibuprofen and ice.  If there is no improvement in your symptoms, or if there is any worsening of symptoms, or if you have any additional concerns, please return for re-evaluation; or, if we are closed, consider going to the Emergency Room for evaluation if symptoms urgent.

## 2015-09-07 NOTE — Progress Notes (Signed)
Pre visit review using our clinic review tool, if applicable. No additional management support is needed unless otherwise documented below in the visit note. 

## 2015-09-07 NOTE — Progress Notes (Signed)
Subjective:    Patient ID: Debra [name], female    DOB: 12-Apr-1976, 40 y.o.   MRN: [id number]   TIYONA DESOUZA is a 40 y.o. female who presents today for an acute visit.    HPI Comments: Patient here for evaluation of a right 2nd and 3rd  pain of 4 days. She  dropped a bowl on her foot and since then has had moderate pain. Mild swelling of toes worse at the end of the day.No fever, chills. Able to bear weight. Has tried ice, NSAIDs with some relief.    Past Medical History  Diagnosis Date  . Diabetes mellitus type II 08/2009 dx  . Morbid obesity ([place])     s/p lab band 09/2010  . Knee pain, right     DJD, post traumatic with repeat falls  . Asthma   . Allergic rhinitis, cause unspecified   . Ovarian cyst     right s/p resection June 2013  . Hypertension   . Endometriosis    Allergies: Influenza vaccines and Morphine Current Outpatient Prescriptions on File Prior to Visit  Medication Sig Dispense Refill  . albuterol (PROAIR HFA) 108 (90 BASE) MCG/ACT inhaler Inhale 2 puffs into the lungs every 4 (four) hours as needed for wheezing or shortness of breath. 18 g 2  . albuterol (PROVENTIL) (2.5 MG/3ML) 0.083% nebulizer solution Take 2.5 mg by nebulization every 6 (six) hours as needed for wheezing or shortness of breath.    . blood glucose meter kit and supplies Dispense based on patient and insurance preference. Use up to four times daily as directed. (FOR ICD-9 250.00, 250.01). 1 each 0  . fexofenadine (ALLEGRA) 180 MG tablet Take 180 mg by mouth daily as needed. For seasonal allergies    . Fluticasone-Salmeterol (ADVAIR DISKUS) 250-50 MCG/DOSE AEPB Inhale 1 puff into the lungs 2 (two) times daily. 1 each 3  . glipiZIDE (GLUCOTROL) 5 MG tablet Take 1 tablet (5 mg total) by mouth 2 (two) times daily before a meal. 60 tablet 3  . glucose blood (ONE TOUCH ULTRA TEST) test strip Use as directed 100 each 2  . ibuprofen (ADVIL,MOTRIN) 800 MG tablet Take 1 tablet (800 mg total) by mouth  every 8 (eight) hours as needed. 90 tablet 1  . losartan (COZAAR) 100 MG tablet TAKE 1 TABLET BY MOUTH DAILY 90 tablet 0  . mometasone (NASONEX) 50 MCG/ACT nasal spray Place 2 sprays into the nose daily as needed. For nasal spray    . norethindrone (AYGESTIN) 5 MG tablet Take 1 by mouth daily    . olopatadine (PATANOL) 0.1 % ophthalmic solution Place 1 drop into both eyes 2 (two) times daily. 5 mL 12  . sitaGLIPtin (JANUVIA) 100 MG tablet Take 1 tablet (100 mg total) by mouth at bedtime. 90 tablet 3   No current facility-administered medications on file prior to visit.    Social History  Substance Use Topics  . Smoking status: Never Smoker   . Smokeless tobacco: Never Used  . Alcohol Use: No    Review of Systems  Constitutional: Negative for fever and chills.  Musculoskeletal: Positive for joint swelling.  Skin: Negative for wound.      Objective:    Ht '5\' 8"'  (1.727 m)  Wt 329 lb 2 oz (149.29 kg)  BMI 50.05 kg/m2  LMP 07/28/2011   Physical Exam  Constitutional: She appears well-developed and well-nourished.  Eyes: Conjunctivae are normal.  Cardiovascular: Normal rate, regular rhythm, normal heart sounds  and normal pulses.   Palpable pedal pulses  Pulmonary/Chest: Effort normal and breath sounds normal. She has no wheezes. She has no rhonchi. She has no rales.  Musculoskeletal:       Right foot: There is tenderness and bony tenderness. There is normal range of motion, no swelling, no deformity and no laceration.       Feet:  Pain with palpation right second and third metacarpal. No gross deformity, erythema, increased warmth. No laceration. Able to flex and extend toes normally.     Neurological: She is alert.  Skin: Skin is warm and dry.  Psychiatric: She has a normal mood and affect. Her speech is normal and behavior is normal. Thought content normal.  Vitals reviewed.      Assessment & Plan:   1. Toe pain, right Working diagnosis of contusion of second and  right metatarsals. Pending x-ray to evaluate for fracture. If negative for fracture, patient and I agreed on conservative therapy at this time with over-the-counter ibuprofen and ice.  - DG Foot Complete Right; Future    I have discontinued Ms. Cliburn's methocarbamol. I am also having her maintain her fexofenadine, mometasone, Fluticasone-Salmeterol, olopatadine, norethindrone, glucose blood, blood glucose meter kit and supplies, ibuprofen, albuterol, sitaGLIPtin, albuterol, losartan, and glipiZIDE.   No orders of the defined types were placed in this encounter.     Start medications as prescribed and explained to patient on After Visit Summary ( AVS). Risks, benefits, and alternatives of the medications and treatment plan prescribed today were discussed, and patient expressed understanding.   Education regarding symptom management and diagnosis given to patient.   Follow-up:Plan follow-up as discussed or as needed if any worsening symptoms or change in condition.   Continue to follow with Hoyt Koch, MD for routine health maintenance.   Debra [name] and I agreed with plan.   Mable Paris, FNP

## 2015-10-11 ENCOUNTER — Other Ambulatory Visit (INDEPENDENT_AMBULATORY_CARE_PROVIDER_SITE_OTHER): Payer: BC Managed Care – PPO

## 2015-10-11 ENCOUNTER — Ambulatory Visit (INDEPENDENT_AMBULATORY_CARE_PROVIDER_SITE_OTHER): Payer: BC Managed Care – PPO | Admitting: Internal Medicine

## 2015-10-11 ENCOUNTER — Encounter: Payer: Self-pay | Admitting: Internal Medicine

## 2015-10-11 VITALS — BP 138/78 | HR 61 | Temp 99.1°F | Resp 16 | Ht 68.0 in | Wt 338.0 lb

## 2015-10-11 DIAGNOSIS — IMO0001 Reserved for inherently not codable concepts without codable children: Secondary | ICD-10-CM

## 2015-10-11 DIAGNOSIS — J4530 Mild persistent asthma, uncomplicated: Secondary | ICD-10-CM

## 2015-10-11 DIAGNOSIS — M25561 Pain in right knee: Secondary | ICD-10-CM

## 2015-10-11 DIAGNOSIS — E1165 Type 2 diabetes mellitus with hyperglycemia: Secondary | ICD-10-CM

## 2015-10-11 DIAGNOSIS — Z0000 Encounter for general adult medical examination without abnormal findings: Secondary | ICD-10-CM

## 2015-10-11 LAB — COMPREHENSIVE METABOLIC PANEL
ALBUMIN: 3.8 g/dL (ref 3.5–5.2)
ALK PHOS: 65 U/L (ref 39–117)
ALT: 14 U/L (ref 0–35)
AST: 10 U/L (ref 0–37)
BILIRUBIN TOTAL: 0.4 mg/dL (ref 0.2–1.2)
BUN: 10 mg/dL (ref 6–23)
CALCIUM: 8.9 mg/dL (ref 8.4–10.5)
CO2: 28 mEq/L (ref 19–32)
CREATININE: 0.85 mg/dL (ref 0.40–1.20)
Chloride: 105 mEq/L (ref 96–112)
GFR: 94.58 mL/min (ref >60.00)
GLUCOSE: 205 mg/dL — AB (ref 70–99)
POTASSIUM: 3.7 meq/L (ref 3.5–5.1)
Sodium: 139 mEq/L (ref 135–145)
TOTAL PROTEIN: 6.6 g/dL (ref 6.0–8.3)

## 2015-10-11 LAB — LIPID PANEL
CHOLESTEROL: 159 mg/dL (ref 0–200)
HDL: 39.2 mg/dL (ref >39.00)
LDL Cholesterol: 109 mg/dL — ABNORMAL HIGH (ref 0–99)
NonHDL: 120.11
TRIGLYCERIDES: 57 mg/dL (ref 0.0–149.0)
Total CHOL/HDL Ratio: 4
VLDL: 11.4 mg/dL (ref 0.0–40.0)

## 2015-10-11 LAB — HEMOGLOBIN A1C: Hgb A1c MFr Bld: 8.7 % — ABNORMAL HIGH (ref 4.6–6.5)

## 2015-10-11 MED ORDER — EXENATIDE ER 2 MG ~~LOC~~ PEN: 2.0000 mg | PEN_INJECTOR | SUBCUTANEOUS | Status: DC

## 2015-10-11 NOTE — Progress Notes (Signed)
Pre visit review using our clinic review tool, if applicable. No additional management support is needed unless otherwise documented below in the visit note. 

## 2015-10-11 NOTE — Patient Instructions (Signed)
We will check the labs today and send the results on mychart.   We have sent in bydureon to the pharmacy which is a shot that you give yourself once a week. The pharmacy can teach you but it they cannot you can bring the first shot here to our office for teaching.   Diabetes Mellitus and Food It is important for you to manage your blood sugar (glucose) level. Your blood glucose level can be greatly affected by what you eat. Eating healthier foods in the appropriate amounts throughout the day at about the same time each day will help you control your blood glucose level. It can also help slow or prevent worsening of your diabetes mellitus. Healthy eating may even help you improve the level of your blood pressure and reach or maintain a healthy weight.  General recommendations for healthful eating and cooking habits include:  Eating meals and snacks regularly. Avoid going long periods of time without eating to lose weight.  Eating a diet that consists mainly of plant-based foods, such as fruits, vegetables, nuts, legumes, and whole grains.  Using low-heat cooking methods, such as baking, instead of high-heat cooking methods, such as deep frying. Work with your dietitian to make sure you understand how to use the Nutrition Facts information on food labels. HOW CAN FOOD AFFECT ME? Carbohydrates Carbohydrates affect your blood glucose level more than any other type of food. Your dietitian will help you determine how many carbohydrates to eat at each meal and teach you how to count carbohydrates. Counting carbohydrates is important to keep your blood glucose at a healthy level, especially if you are using insulin or taking certain medicines for diabetes mellitus. Alcohol Alcohol can cause sudden decreases in blood glucose (hypoglycemia), especially if you use insulin or take certain medicines for diabetes mellitus. Hypoglycemia can be a life-threatening condition. Symptoms of hypoglycemia (sleepiness,  dizziness, and disorientation) are similar to symptoms of having too much alcohol.  If your health care provider has given you approval to drink alcohol, do so in moderation and use the following guidelines:  Women should not have more than one drink per day, and men should not have more than two drinks per day. One drink is equal to:  12 oz of beer.  5 oz of wine.  1 oz of hard liquor.  Do not drink on an empty stomach.  Keep yourself hydrated. Have water, diet soda, or unsweetened iced tea.  Regular soda, juice, and other mixers might contain a lot of carbohydrates and should be counted. WHAT FOODS ARE NOT RECOMMENDED? As you make food choices, it is important to remember that all foods are not the same. Some foods have fewer nutrients per serving than other foods, even though they might have the same number of calories or carbohydrates. It is difficult to get your body what it needs when you eat foods with fewer nutrients. Examples of foods that you should avoid that are high in calories and carbohydrates but low in nutrients include:  Trans fats (most processed foods list trans fats on the Nutrition Facts label).  Regular soda.  Juice.  Candy.  Sweets, such as cake, pie, doughnuts, and cookies.  Fried foods. WHAT FOODS CAN I EAT? Eat nutrient-rich foods, which will nourish your body and keep you healthy. The food you should eat also will depend on several factors, including:  The calories you need.  The medicines you take.  Your weight.  Your blood glucose level.  Your blood pressure  level.  Your cholesterol level. You should eat a variety of foods, including:  Protein.  Lean cuts of meat.  Proteins low in saturated fats, such as fish, egg whites, and beans. Avoid processed meats.  Fruits and vegetables.  Fruits and vegetables that may help control blood glucose levels, such as apples, mangoes, and yams.  Dairy products.  Choose fat-free or low-fat dairy  products, such as milk, yogurt, and cheese.  Grains, bread, pasta, and rice.  Choose whole grain products, such as multigrain bread, whole oats, and brown rice. These foods may help control blood pressure.  Fats.  Foods containing healthful fats, such as nuts, avocado, olive oil, canola oil, and fish. DOES EVERYONE WITH DIABETES MELLITUS HAVE THE SAME MEAL PLAN? Because every person with diabetes mellitus is different, there is not one meal plan that works for everyone. It is very important that you meet with a dietitian who will help you create a meal plan that is just right for you.   This information is not intended to replace advice given to you by your health care provider. Make sure you discuss any questions you have with your health care provider.   Document Released: 01/03/2005 Document Revised: 04/29/2014 Document Reviewed: 03/05/2013 Elsevier Interactive Patient Education [employer]

## 2015-10-13 ENCOUNTER — Encounter: Payer: Self-pay | Admitting: Internal Medicine

## 2015-10-13 DIAGNOSIS — Z0000 Encounter for general adult medical examination without abnormal findings: Secondary | ICD-10-CM | POA: Insufficient documentation

## 2015-10-13 DIAGNOSIS — Z0001 Encounter for general adult medical examination with abnormal findings: Secondary | ICD-10-CM | POA: Insufficient documentation

## 2015-10-13 DIAGNOSIS — Z1211 Encounter for screening for malignant neoplasm of colon: Secondary | ICD-10-CM | POA: Insufficient documentation

## 2015-10-13 NOTE — Assessment & Plan Note (Signed)
Adding glp-1 to her glipizide and losartan. She is not taking metformin due to her preference. Stopping januvia as not needed with glp-1. Last Hga1c above goal, foot exam done. Reminded about yearly eye exam.

## 2015-10-13 NOTE — Assessment & Plan Note (Signed)
Checking labs, we counseled on her uncontrolled diabetes which is at risk to cause significant problems. Declines need for hiv screening. Given screening recommendations.

## 2015-10-13 NOTE — Assessment & Plan Note (Signed)
Breathing okay with her advair, allegra, rare albuterol usage. No flare today.

## 2015-10-13 NOTE — Assessment & Plan Note (Signed)
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 They are wanting to do surgery on her knee and for her to lose weight to help. She does not want surgery. We talked about tumeric being helpful for joints and weight loss.

## 2015-10-13 NOTE — Progress Notes (Signed)
   Subjective:    Patient ID: Debra [name], female    DOB: 09/13/1975, 40 y.o.   MRN: [id number]  HPI The patient is a 40 YO female coming in for wellness. Not taking her meds well right now for the sugars due to preference.   PM[facility], [facility], social history reviewed and updated.   Review of Systems  Constitutional: Negative for fever, activity change, appetite change, fatigue and unexpected weight change.  HENT: Negative.   Eyes: Negative.   Respiratory: Negative for cough, chest tightness, shortness of breath and wheezing.        Minimal compared to before.   Cardiovascular: Negative for chest pain, palpitations and leg swelling.  Gastrointestinal: Negative for nausea, abdominal pain, diarrhea, constipation and abdominal distention.  Musculoskeletal: Negative.   Skin: Negative.   Neurological: Negative.   Psychiatric/Behavioral: Negative.       Objective:   Physical Exam  Constitutional: She is oriented to person, place, and time. She appears well-developed and well-nourished.  Overweight  HENT:  Head: Normocephalic and atraumatic.  Eyes: EOM are normal.  Neck: Normal range of motion.  Cardiovascular: Normal rate and regular rhythm.   Pulmonary/Chest: Effort normal and breath sounds normal. No respiratory distress. She has no wheezes. She has no rales.  Abdominal: Soft. Bowel sounds are normal. She exhibits no distension. There is no tenderness. There is no rebound.  Musculoskeletal: She exhibits no edema.  Neurological: She is alert and oriented to person, place, and time.  Skin: Skin is warm and dry.  Psychiatric: She has a normal mood and affect.   Filed Vitals:   10/11/15 1106  BP: 138/78  Pulse: 61  Temp: 99.1 F (37.3 C)  TempSrc: Oral  Resp: 16  Height: 5\' 8"  (1.727 m)  Weight: 338 lb (153.316 kg)  SpO2: 98%      Assessment & Plan:

## 2015-10-13 NOTE — Assessment & Plan Note (Signed)
Weight is increased since last visit and she wants help to lose weight. S/P lap banding and this helped her lose some weight initially and has kept some of that weight off.

## 2015-10-17 ENCOUNTER — Telehealth: Payer: Self-pay | Admitting: Geriatric Medicine

## 2015-10-17 ENCOUNTER — Ambulatory Visit: Payer: BC Managed Care – PPO | Admitting: Internal Medicine

## 2015-10-17 ENCOUNTER — Telehealth: Payer: Self-pay

## 2015-10-17 NOTE — Telephone Encounter (Signed)
Patient called to follow up on her PA for her exenatide.

## 2015-10-17 NOTE — Telephone Encounter (Signed)
PA initiated via CoverMyMeds key [id number]

## 2015-10-17 NOTE — Telephone Encounter (Signed)
Exenatide ER 2 MG PEN [[id number]][[id number]]      Patient called and has some question about this. Can you please follow up. She states she has been waiting since last week.

## 2015-10-17 NOTE — Telephone Encounter (Signed)
PA APPROVED, pt advised, pharmacy to be notified via CoverMyMeds 

## 2015-10-27 ENCOUNTER — Telehealth: Payer: Self-pay | Admitting: Emergency Medicine

## 2015-10-27 NOTE — Telephone Encounter (Signed)
Patient called and stated the prescription Exenatide ER 2 MG PEN is too expensive. Is there something else that can replace it. Please advise and call the pt back thanks.

## 2015-10-30 MED ORDER — DULAGLUTIDE 1.5 MG/0.5ML ~~LOC~~ SOAJ: 1.5000 mg | SUBCUTANEOUS | Status: DC

## 2015-10-30 NOTE — Telephone Encounter (Signed)
Sent in trulicity which is very similar to the bydureon.

## 2015-10-30 NOTE — Telephone Encounter (Signed)
Can she call her insurance company and see if trulicity, victoza, or tanzeum would be cheaper?

## 2015-10-30 NOTE — Telephone Encounter (Signed)
Patient is going to call insurance company to see what medication is covered and will call us back.

## 2015-10-30 NOTE — Telephone Encounter (Signed)
Pt called in and said that her out of pocket is way less for the Trullicity and the Victoza .  She would like to know if one of these would work for her?

## 2015-10-30 NOTE — Telephone Encounter (Signed)
Patient aware.

## 2015-11-03 ENCOUNTER — Other Ambulatory Visit: Payer: Self-pay | Admitting: Internal Medicine

## 2015-12-17 ENCOUNTER — Encounter (HOSPITAL_COMMUNITY): Payer: Self-pay | Admitting: Emergency Medicine

## 2015-12-17 ENCOUNTER — Emergency Department (HOSPITAL_COMMUNITY)
Admission: EM | Admit: 2015-12-17 | Discharge: 2015-12-18 | Disposition: A | Discharge status: Home / Self Care | Payer: BC Managed Care – PPO | Attending: Emergency Medicine | Admitting: Emergency Medicine

## 2015-12-17 DIAGNOSIS — Z79899 Other long term (current) drug therapy: Secondary | ICD-10-CM | POA: Diagnosis not present

## 2015-12-17 DIAGNOSIS — R21 Rash and other nonspecific skin eruption: Secondary | ICD-10-CM | POA: Diagnosis present

## 2015-12-17 DIAGNOSIS — E119 Type 2 diabetes mellitus without complications: Secondary | ICD-10-CM | POA: Diagnosis not present

## 2015-12-17 DIAGNOSIS — Z7984 Long term (current) use of oral hypoglycemic drugs: Secondary | ICD-10-CM | POA: Diagnosis not present

## 2015-12-17 DIAGNOSIS — L03114 Cellulitis of left upper limb: Secondary | ICD-10-CM | POA: Insufficient documentation

## 2015-12-17 DIAGNOSIS — Z791 Long term (current) use of non-steroidal anti-inflammatories (NSAID): Secondary | ICD-10-CM | POA: Insufficient documentation

## 2015-12-17 DIAGNOSIS — J45909 Unspecified asthma, uncomplicated: Secondary | ICD-10-CM | POA: Diagnosis not present

## 2015-12-17 DIAGNOSIS — I159 Secondary hypertension, unspecified: Secondary | ICD-10-CM | POA: Insufficient documentation

## 2015-12-17 NOTE — ED Triage Notes (Addendum)
Pt from home with complaints of reddened area on her left arm that looks similar to cellulitis. Pt states area burns and itches and is sensitive to touch. Pt denies similar areas in other parts of her body and states the area was not inflammed when she went to sleep last night. Pt put hydrocortisone cream on the area with no relief.   Pt has history of htn. Pt states she normally takes her bp meds right before bed. Pt states her bp is normally 130/80. Pt denies headache or changes in vision

## 2015-12-17 NOTE — ED Provider Notes (Signed)
[facility] DEPT Provider Note   CSN: [id number] Arrival date & time: 12/17/15  2259  By signing my name below, I, Dolores Hoose, attest that this documentation has been prepared under the direction and in the presence of Deno Etienne, DO . Electronically Signed: Dolores Hoose, Scribe. 12/17/2015. 11:51 PM.  History   Chief Complaint Chief Complaint  Patient presents with  . Rash  . Hypertension   The history is provided by the patient. No language interpreter was used.   Pertinent negatives include no chest pain, no abdominal pain, no headaches and no shortness of breath.  HPI Comments:  Debra [name] is a 40 y.o. female with PMHx of HTN and DM who presents to the Emergency Department complaining of sudden-onset worsening upper left arm rash onset this morning. She reports associated pain to the area, worsened by palpation. No alleviating factors indicated. Pt reports she woke up this morning with a small rash, but it has spread throughout the day. She denies any recent injury, fever, headaches, shortness of breath, abdominal pain or nausea.  Past Medical History:  Diagnosis Date  . Allergic rhinitis, cause unspecified   . Asthma   . Diabetes mellitus type II 08/2009 dx  . Endometriosis   . Hypertension   . Knee pain, right    DJD, post traumatic with repeat falls  . Morbid obesity ([place])    s/p lab band 09/2010  . Ovarian cyst    right s/p resection June 2013    Patient Active Problem List   Diagnosis Date Noted  . Routine general medical examination at a health care facility 10/13/2015  . Endometriosis 11/03/2013  . Right knee pain   . Hypertension 08/26/2011  . Hx of laparoscopic gastric banding, 10/02/2010. 11/27/2010  . Diabetes mellitus type 2, uncontrolled ([place]) 08/23/2009  . Morbid obesity ([place]) 08/22/2009  . Allergic rhinitis 08/22/2009  . Asthma 08/22/2009  . ARTHRITIS 08/22/2009    Past Surgical History:  Procedure Laterality Date  . ABDOMINAL  HYSTERECTOMY  05/14/12   [place]  . LAPAROSCOPIC GASTRIC BANDING  10/02/2010   start weight 349#  . SALPINGOOPHORECTOMY  10/08/11   right    OB History    No data available       Home Medications    Prior to Admission medications   Medication Sig Start Date End Date Taking? Authorizing Provider  albuterol (PROAIR HFA) 108 (90 BASE) MCG/ACT inhaler Inhale 2 puffs into the lungs every 4 (four) hours as needed for wheezing or shortness of breath. 04/18/15   Rowe Clack, MD  albuterol (PROVENTIL) (2.5 MG/3ML) 0.083% nebulizer solution Take 2.5 mg by nebulization every 6 (six) hours as needed for wheezing or shortness of breath.    Historical Provider, MD  blood glucose meter kit and supplies Dispense based on patient and insurance preference. Use up to four times daily as directed. (FOR ICD-9 250.00, 250.01). 06/03/14   Rowe Clack, MD  cephALEXin (KEFLEX) 500 MG capsule Take 1 capsule (500 mg total) by mouth 4 (four) times daily. 12/18/15   Deno Etienne, DO  Dulaglutide (TRULICITY) 1.5 OH/6.0VP SOPN Inject 1.5 mg into the skin once a week. 10/30/15   Hoyt Koch, MD  fexofenadine (ALLEGRA) 180 MG tablet Take 180 mg by mouth daily as needed. For seasonal allergies    Historical Provider, MD  Fluticasone-Salmeterol (ADVAIR DISKUS) 250-50 MCG/DOSE AEPB Inhale 1 puff into the lungs 2 (two) times daily. 07/22/11   Rowe Clack, MD  glipiZIDE (GLUCOTROL) 5 MG tablet Take 1 tablet (5 mg total) by mouth 2 (two) times daily before a meal. 07/07/15   Hoyt Koch, MD  glucose blood (ONE TOUCH ULTRA TEST) test strip Use as directed 10/28/13   Rowe Clack, MD  ibuprofen (ADVIL,MOTRIN) 800 MG tablet Take 1 tablet (800 mg total) by mouth every 8 (eight) hours as needed. 10/19/14   Rowe Clack, MD  losartan (COZAAR) 100 MG tablet TAKE 1 TABLET BY MOUTH DAILY 11/03/15   Hoyt Koch, MD  mometasone (NASONEX) 50 MCG/ACT nasal spray Place 2 sprays into  the nose daily as needed. For nasal spray    Historical Provider, MD  norethindrone (AYGESTIN) 5 MG tablet Take 1 by mouth daily 07/06/13   Historical Provider, MD  olopatadine (PATANOL) 0.1 % ophthalmic solution Place 1 drop into both eyes 2 (two) times daily. 08/08/12   Darlyne Russian, MD  sulfamethoxazole-trimethoprim (BACTRIM DS,SEPTRA DS) 800-160 MG tablet Take 1 tablet by mouth 2 (two) times daily. 12/18/15 12/25/15  Deno Etienne, DO    Family History Family History  Problem Relation Age of Onset  . Heart disease Mother   . Hypertension Mother   . Diabetes Mother   . Stroke Father 76  . Diabetes Father     Social History Social History  Substance Use Topics  . Smoking status: Never Smoker  . Smokeless tobacco: Never Used  . Alcohol use No     Allergies   Influenza vaccines and Morphine   Review of Systems Review of Systems  Constitutional: Negative for chills and fever.  HENT: Negative for congestion and rhinorrhea.   Eyes: Negative for redness and visual disturbance.  Respiratory: Negative for shortness of breath and wheezing.   Cardiovascular: Negative for chest pain and palpitations.  Gastrointestinal: Negative for abdominal pain, nausea and vomiting.  Genitourinary: Negative for dysuria and urgency.  Musculoskeletal: Negative for arthralgias and myalgias.  Skin: Positive for rash. Negative for pallor and wound.  Neurological: Negative for dizziness and headaches.  All other systems reviewed and are negative.    Physical Exam Updated Vital Signs BP 200/98 (BP Location: Right Arm)   Pulse 73   Temp 99.2 F (37.3 C) (Oral)   Ht _0  (1.727 m)   Wt (!) 320 lb (145.2 kg)   LMP 07/28/2011   SpO2 100%   BMI 48.66 kg/m   Physical Exam  Constitutional: She is oriented to person, place, and time. She appears well-developed and well-nourished. No distress.  HENT:  Head: Normocephalic and atraumatic.  Eyes: EOM are normal. Pupils are equal, round, and reactive to  light.  Neck: Normal range of motion. Neck supple.  Cardiovascular: Normal rate and regular rhythm.  Exam reveals no gallop and no friction rub.   No murmur heard. Pulmonary/Chest: Effort normal. She has no wheezes. She has no rales.  Abdominal: Soft. She exhibits no distension. There is no tenderness.  Musculoskeletal: She exhibits no edema or tenderness.  Neurological: She is alert and oriented to person, place, and time.  Skin: Skin is warm and dry. She is not diaphoretic.  Two 2cmx4cm erythmatous areas to left upper arm. Warm to touch. Blanchable.   Psychiatric: She has a normal mood and affect. Her behavior is normal.  Nursing note and vitals reviewed.    ED Treatments / Results  DIAGNOSTIC STUDIES:  Oxygen Saturation is 100% on RA, normal by my interpretation.    COORDINATION OF CARE:  12:01 AM Discussed treatment  plan with pt at bedside which included antibitoics and pt agreed to plan.  Labs (all labs ordered are listed, but only abnormal results are displayed) Labs Reviewed - No data to display  EKG  EKG Interpretation None       Radiology No results found.  Procedures Procedures (including critical care time)  Medications Ordered in ED Medications - No data to display   Initial Impression / Assessment and Plan / ED Course  I have reviewed the triage vital signs and the nursing notes.  Pertinent labs & imaging results that were available during my care of the patient were reviewed by me and considered in my medical decision making (see chart for details).  Clinical Course    40 yo F with a cc of a rash to her right upper arm.  Denies injury, lesions concerning for burn, but no mechanism.  Warm to touch, patient with no systemic symptoms.  Also with asymptomatic hypertension.  Start on abx, PCP follow up.   12:04 AM:  I have discussed the diagnosis/risks/treatment options with the patient and family and believe the pt to be eligible for discharge home to  follow-up with PCP. We also discussed returning to the ED immediately if new or worsening sx occur. We discussed the sx which are most concerning (e.g., sudden worsening pain, fever, inability to tolerate by mouth) that necessitate immediate return. Medications administered to the patient during their visit and any new prescriptions provided to the patient are listed below.  Medications given during this visit Medications - No data to display   The patient appears reasonably screen and/or stabilized for discharge and I doubt any other medical condition or other [facility] requiring further screening, evaluation, or treatment in the ED at this time prior to discharge.    Final Clinical Impressions(s) / ED Diagnoses   Final diagnoses:  Secondary hypertension, unspecified  Cellulitis of left upper extremity    New Prescriptions New Prescriptions   CEPHALEXIN (KEFLEX) 500 MG CAPSULE    Take 1 capsule (500 mg total) by mouth 4 (four) times daily.   SULFAMETHOXAZOLE-TRIMETHOPRIM (BACTRIM DS,SEPTRA DS) 800-160 MG TABLET    Take 1 tablet by mouth 2 (two) times daily.    I personally performed the services described in this documentation, which was scribed in my presence. The recorded information has been reviewed and is accurate.     Deno Etienne, DO 12/18/15 0004

## 2015-12-18 MED ORDER — SULFAMETHOXAZOLE-TRIMETHOPRIM 800-160 MG PO TABS: 1.0000 | ORAL_TABLET | Freq: Two times a day (BID) | ORAL | 0 refills | Status: AC

## 2015-12-18 MED ORDER — CEPHALEXIN 500 MG PO CAPS: 500.0000 mg | ORAL_CAPSULE | Freq: Four times a day (QID) | ORAL | 0 refills | Status: DC

## 2015-12-18 MED ORDER — FLUCONAZOLE 150 MG PO TABS: 150.0000 mg | ORAL_TABLET | Freq: Once | ORAL | 0 refills | Status: AC

## 2015-12-18 NOTE — Discharge Instructions (Signed)
Return for spreading redness, fever, nausea, vomiting. Follow up with your family doc for blood pressure recheck

## 2015-12-28 ENCOUNTER — Ambulatory Visit (INDEPENDENT_AMBULATORY_CARE_PROVIDER_SITE_OTHER): Payer: BC Managed Care – PPO | Admitting: Internal Medicine

## 2015-12-28 ENCOUNTER — Encounter: Payer: Self-pay | Admitting: Internal Medicine

## 2015-12-28 DIAGNOSIS — L03114 Cellulitis of left upper limb: Secondary | ICD-10-CM

## 2015-12-28 DIAGNOSIS — I10 Essential (primary) hypertension: Secondary | ICD-10-CM

## 2015-12-28 DIAGNOSIS — L0390 Cellulitis, unspecified: Secondary | ICD-10-CM | POA: Insufficient documentation

## 2015-12-28 MED ORDER — PREDNISONE 20 MG PO TABS: 40.0000 mg | ORAL_TABLET | Freq: Every day | ORAL | 0 refills | Status: DC

## 2015-12-28 NOTE — Progress Notes (Signed)
Pre visit review using our clinic review tool, if applicable. No additional management support is needed unless otherwise documented below in the visit note. 

## 2015-12-28 NOTE — Progress Notes (Signed)
   Subjective:    Patient ID: Debra [name], female    DOB: 04/27/1975, 40 y.o.   MRN: [id number]  HPI The patient is a 40 YO female coming in for follow up of ER visit (for cellulitis, given keflex and bactrim, took those and got yeast infection, took diflucan and it resolved). She has had clearing of the area on her left arm/shoulder. She woke up with it and denies knowing the cause. No known injury. She is not having any pain or itching anymore. No adverse reaction with the antibiotics except the yeast infection.   Review of Systems  Constitutional: Negative for activity change, appetite change, fatigue, fever and unexpected weight change.  Respiratory: Negative for cough, chest tightness, shortness of breath and wheezing.   Cardiovascular: Negative for chest pain, palpitations and leg swelling.  Gastrointestinal: Negative for abdominal distention, abdominal pain, constipation, diarrhea and nausea.  Musculoskeletal: Negative.   Skin: Negative for color change, pallor, rash and wound.  Neurological: Negative.       Objective:   Physical Exam  Constitutional: She is oriented to person, place, and time. She appears well-developed and well-nourished.  Overweight  HENT:  Head: Normocephalic and atraumatic.  Eyes: EOM are normal.  Neck: Normal range of motion.  Cardiovascular: Normal rate and regular rhythm.   Pulmonary/Chest: Effort normal and breath sounds normal. No respiratory distress. She has no wheezes. She has no rales.  Abdominal: Soft. Bowel sounds are normal. She exhibits no distension. There is no tenderness. There is no rebound.  Musculoskeletal: She exhibits no edema.  Neurological: She is alert and oriented to person, place, and time.  Skin: Skin is warm and dry.  Left arm/shoulder appears clear without swelling or redness.   Psychiatric: She has a normal mood and affect.   Vitals:   12/28/15 1105 12/28/15 1137  BP: (!) 150/92 (!) 180/110  Pulse: 64   Resp: 14     Temp: 98.8 F (37.1 C)   TempSrc: Oral   SpO2: 99%   Weight: (!) 332 lb 6.4 oz (150.8 kg)   Height: 5\' 8"  (1.727 m)       Assessment & Plan:

## 2015-12-28 NOTE — Assessment & Plan Note (Signed)
Resolved on the left arm. No abscess or skin lesion or redness of the skin. No swelling or pain. Appears to be healed and does not need further antibiotics.

## 2015-12-28 NOTE — Assessment & Plan Note (Signed)
Weight down 6 pounds with the glp-1 treatment.

## 2015-12-28 NOTE — Assessment & Plan Note (Signed)
Forgot medication last night and she will start taking again and watch her BP at home. Is at goal previously on treatment.

## 2015-12-28 NOTE — Patient Instructions (Signed)
We have sent in the prednisone that you can take if needed this fall. If you need it take 2 pills daily for 5 days.   If you start taking it and are no better in 2-3 days you need to be seen.

## 2016-01-25 ENCOUNTER — Telehealth: Payer: Self-pay | Admitting: Internal Medicine

## 2016-01-25 NOTE — Telephone Encounter (Signed)
Verbal from PCP to not take abx. Use only benadryl and/or topical anti-itch cream.  Pt agreed and stated understanding.

## 2016-01-25 NOTE — Telephone Encounter (Signed)
Patient Name: Debra [name]  DOB: 06/18/1975    Initial Comment Caller states got bit mosquito 3 weeks ago, got infected, was put on abx, got bit again   Nurse Assessment  Nurse: Stefano GaulStringer, RN, Dwana CurdVera Date/Time (Eastern Time): 01/25/2016 1:45:52 PM  Confirm and document reason for call. If symptomatic, describe symptoms. You must click the next button to save text entered. ---Caller states she was bitten by a mosquito 3 weeks ago and again last week. She has been been bitten again on the right forearm and left ankle. was bitten 3-4 days ago. areas are swollen. No fever.  Has the patient traveled out of the country within the last 30 days? ---No  Does the patient have any new or worsening symptoms? ---Yes  Will a triage be completed? ---Yes  Related visit to physician within the last 2 weeks? ---Yes  Does the PT have any chronic conditions? (i.e. diabetes, asthma, etc.) ---Yes  List chronic conditions. ---diabetes  Is the patient pregnant or possibly pregnant? (Ask all females between the ages of 8412-55) ---No  Is this a behavioral health or substance abuse call? ---No     Guidelines    Guideline Title Affirmed Question Affirmed Notes  Mosquito Bite [1] Red or very tender (to touch) area AND [2] started over 24 hours after the bite    Final Disposition User   See Physician within 24 Hours Stringer, RN, Dwana CurdVera    Comments  pt has started a new job and is unable to come to the office for appt and does not want to go to urgent care. She has leftover antibiotics and wants to know if she should start them. Please call pt back regarding medication.  areas are red and swollen   Referrals  GO TO FACILITY REFUSED   Disagree/Comply: Disagree  Disagree/Comply Reason: Disagree with instructions

## 2016-03-29 IMAGING — DX DG CHEST 2V
2 series · 2 of 2 positions shown · non-contrast
Comparison: 02/10/2015 .

CLINICAL DATA: Pneumonia .

EXAM:
CHEST  2 VIEW

[chest pa]
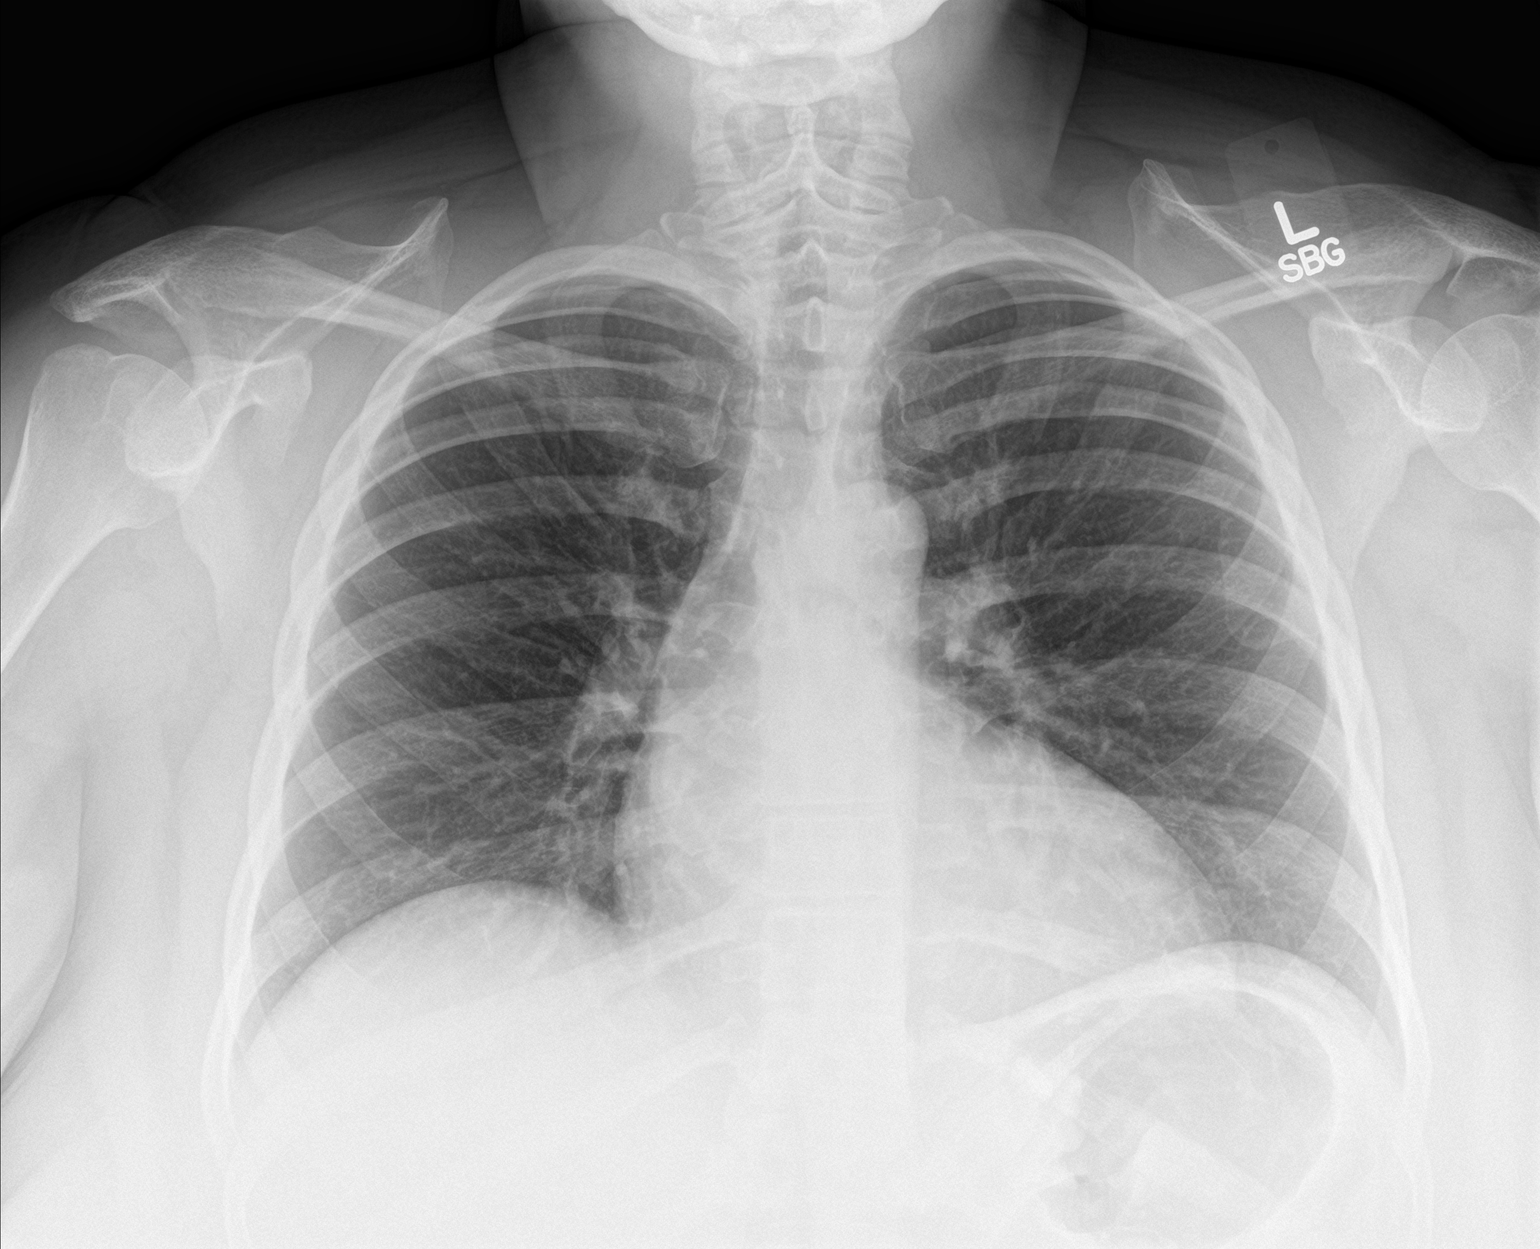

[chest lat]
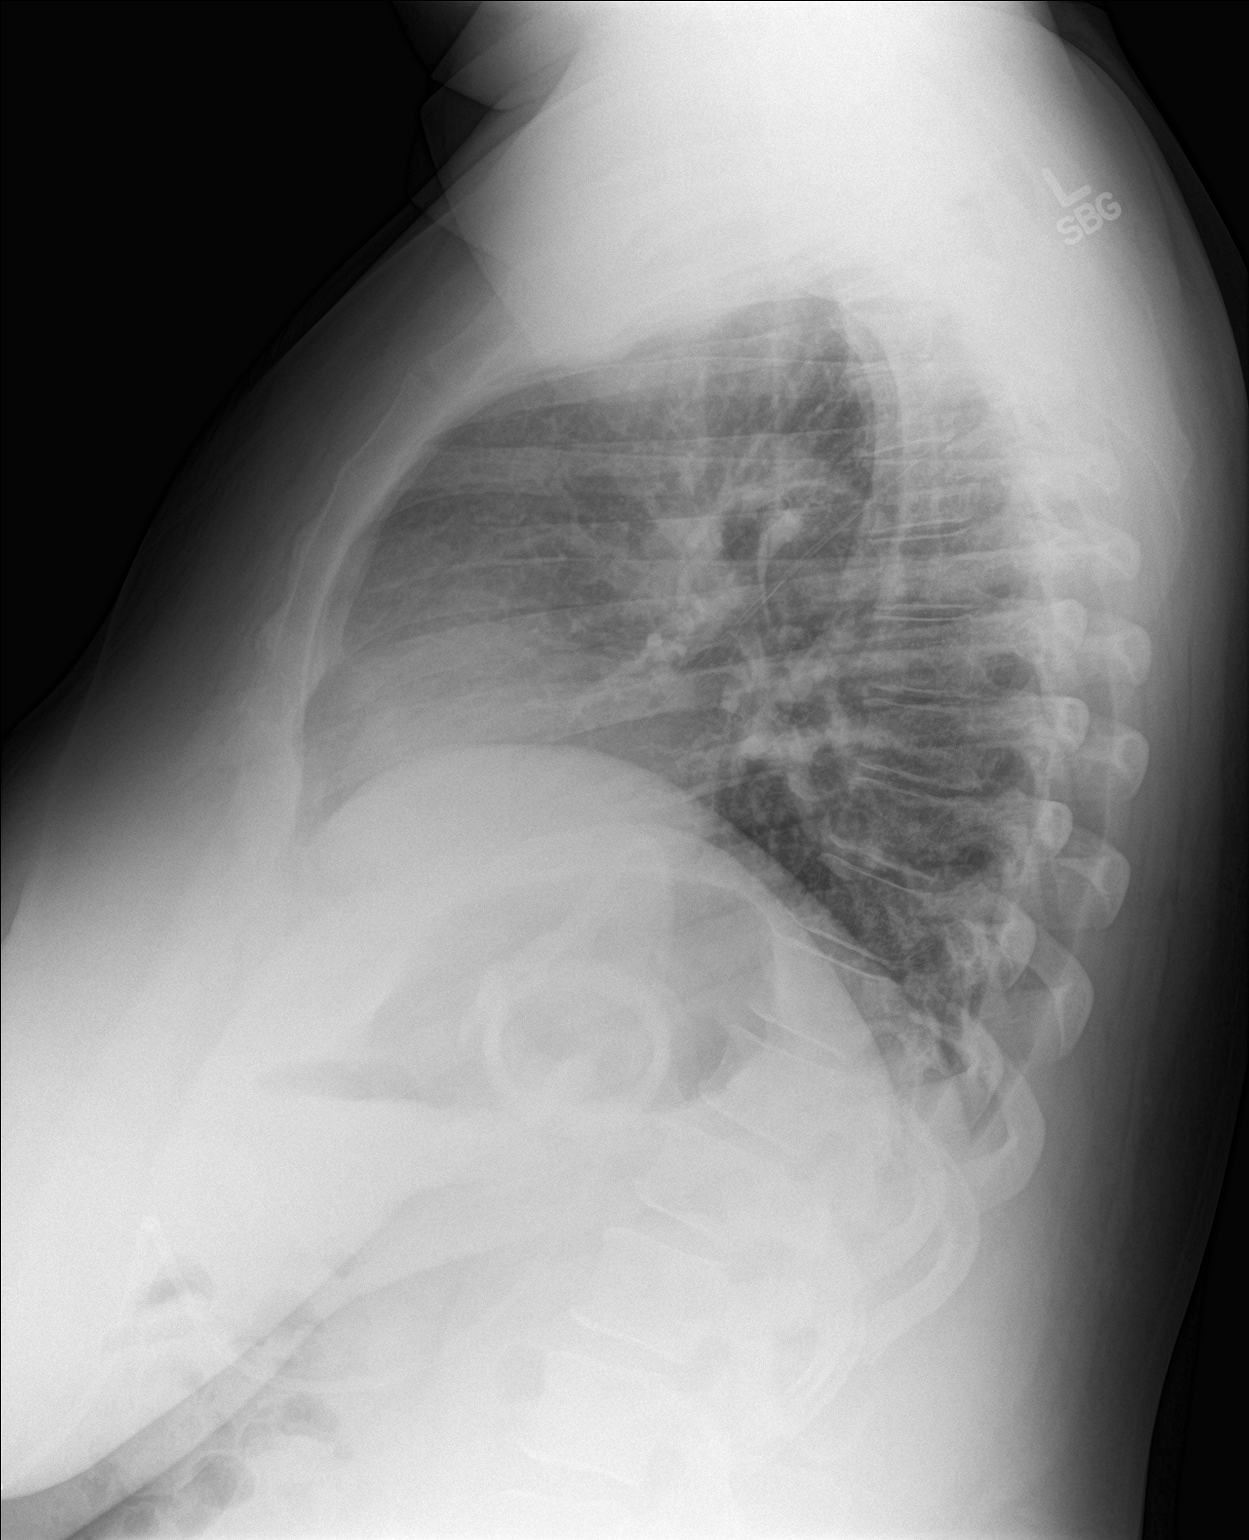

[2 of 2 positions shown; findings below may reference images not displayed]

FINDINGS: Mediastinum and hilar structures normal. Cardiomegaly with normal
pulmonary vascularity. No focal pulmonary infiltrate. Interim
clearing of right lung base infiltrate . No pleural effusion or
pneumothorax.
IMPRESSION: Interim clearing of right lung base infiltrate.

## 2016-04-18 ENCOUNTER — Telehealth: Payer: Self-pay | Admitting: *Deleted

## 2016-04-18 NOTE — Telephone Encounter (Signed)
Rec'd call pt states her rx for Ibuprofen has expired and she need to get another refill. Is this ok to refill...Raechel Chute/lmb

## 2016-04-18 NOTE — Telephone Encounter (Signed)
Notified pt w/MD response. Made appt for 04/23/16 @ 8:45am../lmb

## 2016-04-18 NOTE — Telephone Encounter (Signed)
I would decline, last BP out of control. Would need follow up of BP.

## 2016-04-20 ENCOUNTER — Encounter (HOSPITAL_COMMUNITY): Payer: Self-pay

## 2016-04-20 ENCOUNTER — Emergency Department (HOSPITAL_COMMUNITY)
Admission: EM | Admit: 2016-04-20 | Discharge: 2016-04-20 | Disposition: A | Discharge status: Home / Self Care | Payer: BC Managed Care – PPO | Attending: Emergency Medicine | Admitting: Emergency Medicine

## 2016-04-20 DIAGNOSIS — R197 Diarrhea, unspecified: Secondary | ICD-10-CM | POA: Insufficient documentation

## 2016-04-20 DIAGNOSIS — J45909 Unspecified asthma, uncomplicated: Secondary | ICD-10-CM | POA: Insufficient documentation

## 2016-04-20 DIAGNOSIS — E119 Type 2 diabetes mellitus without complications: Secondary | ICD-10-CM | POA: Diagnosis not present

## 2016-04-20 DIAGNOSIS — I10 Essential (primary) hypertension: Secondary | ICD-10-CM | POA: Diagnosis not present

## 2016-04-20 DIAGNOSIS — R112 Nausea with vomiting, unspecified: Secondary | ICD-10-CM | POA: Diagnosis not present

## 2016-04-20 DIAGNOSIS — Z7984 Long term (current) use of oral hypoglycemic drugs: Secondary | ICD-10-CM | POA: Insufficient documentation

## 2016-04-20 LAB — URINALYSIS, ROUTINE W REFLEX MICROSCOPIC
BILIRUBIN URINE: NEGATIVE
Glucose, UA: 150 mg/dL — AB
KETONES UR: 20 mg/dL — AB
Nitrite: NEGATIVE
Protein, ur: NEGATIVE mg/dL
SPECIFIC GRAVITY, URINE: 1.024 (ref 1.005–1.030)
pH: 5 (ref 5.0–8.0)

## 2016-04-20 LAB — COMPREHENSIVE METABOLIC PANEL
ALBUMIN: 4 g/dL (ref 3.5–5.0)
ALT: 20 U/L (ref 14–54)
AST: 16 U/L (ref 15–41)
Alkaline Phosphatase: 67 U/L (ref 38–126)
Anion gap: 7 (ref 5–15)
BUN: 13 mg/dL (ref 6–20)
CHLORIDE: 107 mmol/L (ref 101–111)
CO2: 22 mmol/L (ref 22–32)
Calcium: 8.8 mg/dL — ABNORMAL LOW (ref 8.9–10.3)
Creatinine, Ser: 0.91 mg/dL (ref 0.44–1.00)
GFR calc Af Amer: >60 mL/min (ref >60)
GLUCOSE: 251 mg/dL — AB (ref 65–99)
POTASSIUM: 3.8 mmol/L (ref 3.5–5.1)
SODIUM: 136 mmol/L (ref 135–145)
Total Bilirubin: 0.7 mg/dL (ref 0.3–1.2)
Total Protein: 6.9 g/dL (ref 6.5–8.1)

## 2016-04-20 LAB — CBC
HEMATOCRIT: 41.6 % (ref 36.0–46.0)
Hemoglobin: 13.7 g/dL (ref 12.0–15.0)
MCH: 26.6 pg (ref 26.0–34.0)
MCHC: 32.9 g/dL (ref 30.0–36.0)
MCV: 80.8 fL (ref 78.0–100.0)
Platelets: 268 10*3/uL (ref 150–400)
RBC: 5.15 MIL/uL — ABNORMAL HIGH (ref 3.87–5.11)
RDW: 12.9 % (ref 11.5–15.5)
WBC: 12.6 10*3/uL — AB (ref 4.0–10.5)

## 2016-04-20 LAB — PREGNANCY, URINE: PREG TEST UR: NEGATIVE

## 2016-04-20 LAB — LIPASE, BLOOD: LIPASE: 24 U/L (ref 11–51)

## 2016-04-20 MED ORDER — ONDANSETRON 4 MG PO TBDP
4.0000 mg | ORAL_TABLET | Freq: Once | ORAL | Status: AC | PRN
Start: 2016-04-20 — End: 2016-04-20
  Administered 2016-04-20: 4 mg via ORAL
  Filled 2016-04-20: qty 1

## 2016-04-20 MED ORDER — ACETAMINOPHEN 325 MG PO TABS
650.0000 mg | ORAL_TABLET | Freq: Once | ORAL | Status: AC
  Administered 2016-04-20: 650 mg via ORAL
  Filled 2016-04-20: qty 2

## 2016-04-20 MED ORDER — ONDANSETRON 4 MG PO TBDP: 4.0000 mg | ORAL_TABLET | Freq: Three times a day (TID) | ORAL | 0 refills | Status: DC | PRN

## 2016-04-20 NOTE — ED Notes (Signed)
Pt was given ice water for po challenge. 

## 2016-04-20 NOTE — ED Notes (Signed)
Patient was A & O x4.  Understood discharge instructions.

## 2016-04-20 NOTE — ED Provider Notes (Signed)
Debra [name] DEPT Provider Note   CSN: [id number] Arrival date & time: 04/20/16  0247     History   Chief Complaint Chief Complaint  Patient presents with  . Nausea  . Emesis  . Diarrhea    HPI Debra [name] is a 40 y.o. female with history of DM type 2, gastric band, morbid obesity presents with nausea, vomiting, and diarrhea Since yesterday 6 Pm. Patient states that she had anything to eat since lunch yesterday. She reports one episode of vomiting no blood present. She reports about 35 episodes of diarrhea since yesterday with no blood present. She states she's had something like this before about 2 years ago which was diagnosed as a stomach flu. Patient states that she had not tried any medications prior to arrival. Patient reports continued nausea but no vomiting since she's been here. Patient also reports 1 episode of diarrhea since she's been here. Patient reports associated chills and epigastric pain. Patient denies dysuria, frequency, urgency. Patient states that the Zofran tablet was given to her in ED has helped her symptoms but still feels nauseous. Patient denies chest pain, shortness of breath.  HPI  Past Medical History:  Diagnosis Date  . Allergic rhinitis, cause unspecified   . Asthma   . Diabetes mellitus type II 08/2009 dx  . Endometriosis   . Hypertension   . Knee pain, right    DJD, post traumatic with repeat falls  . Morbid obesity ([place])    s/p lab band 09/2010  . Ovarian cyst    right s/p resection June 2013    Patient Active Problem List   Diagnosis Date Noted  . Cellulitis 12/28/2015  . Routine general medical examination at a health care facility 10/13/2015  . Endometriosis 11/03/2013  . Right knee pain   . Hypertension 08/26/2011  . Hx of laparoscopic gastric banding, 10/02/2010. 11/27/2010  . Diabetes mellitus type 2, uncontrolled ([place]) 08/23/2009  . Morbid obesity ([place]) 08/22/2009  . Allergic rhinitis 08/22/2009  . Asthma 08/22/2009  .  ARTHRITIS 08/22/2009    Past Surgical History:  Procedure Laterality Date  . ABDOMINAL HYSTERECTOMY  05/14/12   [place]  . LAPAROSCOPIC GASTRIC BANDING  10/02/2010   start weight 349#  . SALPINGOOPHORECTOMY  10/08/11   right    OB History    No data available       Home Medications    Prior to Admission medications   Medication Sig Start Date End Date Taking? Authorizing Provider  albuterol (PROAIR HFA) 108 (90 BASE) MCG/ACT inhaler Inhale 2 puffs into the lungs every 4 (four) hours as needed for wheezing or shortness of breath. 04/18/15   Rowe Clack, MD  albuterol (PROVENTIL) (2.5 MG/3ML) 0.083% nebulizer solution Take 2.5 mg by nebulization every 6 (six) hours as needed for wheezing or shortness of breath.    Historical Provider, MD  blood glucose meter kit and supplies Dispense based on patient and insurance preference. Use up to four times daily as directed. (FOR ICD-9 250.00, 250.01). 06/03/14   Rowe Clack, MD  Dulaglutide (TRULICITY) 1.5 XQ/1.1HE SOPN Inject 1.5 mg into the skin once a week. 10/30/15   Hoyt Koch, MD  fexofenadine (ALLEGRA) 180 MG tablet Take 180 mg by mouth daily as needed. For seasonal allergies    Historical Provider, MD  Fluticasone-Salmeterol (ADVAIR DISKUS) 250-50 MCG/DOSE AEPB Inhale 1 puff into the lungs 2 (two) times daily. 07/22/11   Rowe Clack, MD  glipiZIDE (GLUCOTROL) 5 MG  tablet Take 1 tablet (5 mg total) by mouth 2 (two) times daily before a meal. 07/07/15   Hoyt Koch, MD  glucose blood (ONE TOUCH ULTRA TEST) test strip Use as directed 10/28/13   Rowe Clack, MD  ibuprofen (ADVIL,MOTRIN) 800 MG tablet Take 1 tablet (800 mg total) by mouth every 8 (eight) hours as needed. 10/19/14   Rowe Clack, MD  losartan (COZAAR) 100 MG tablet TAKE 1 TABLET BY MOUTH DAILY 11/03/15   Hoyt Koch, MD  mometasone (NASONEX) 50 MCG/ACT nasal spray Place 2 sprays into the nose daily as needed. For  nasal spray    Historical Provider, MD  norethindrone (AYGESTIN) 5 MG tablet Take 1 by mouth daily 07/06/13   Historical Provider, MD  olopatadine (PATANOL) 0.1 % ophthalmic solution Place 1 drop into both eyes 2 (two) times daily. 08/08/12   Darlyne Russian, MD  ondansetron (ZOFRAN ODT) 4 MG disintegrating tablet Take 1 tablet (4 mg total) by mouth every 8 (eight) hours as needed for nausea or vomiting. 04/20/16   Johannah Rozas Manuel [place], [place]  predniSONE (DELTASONE) 20 MG tablet Take 2 tablets (40 mg total) by mouth daily with breakfast. 12/28/15   Hoyt Koch, MD    Family History Family History  Problem Relation Age of Onset  . Heart disease Mother   . Hypertension Mother   . Diabetes Mother   . Stroke Father 36  . Diabetes Father     Social History Social History  Substance Use Topics  . Smoking status: Never Smoker  . Smokeless tobacco: Never Used  . Alcohol use No     Allergies   Influenza vaccines and Morphine   Review of Systems Review of Systems  Constitutional: Positive for chills. Negative for fever.  HENT: Negative for congestion and sinus pressure.   Eyes: Negative for visual disturbance.  Respiratory: Negative for cough, shortness of breath and wheezing.   Cardiovascular: Negative for chest pain.  Gastrointestinal: Positive for abdominal pain, diarrhea, nausea and vomiting.  Genitourinary: Negative for dysuria and hematuria.  Musculoskeletal: Negative for neck pain and neck stiffness.  Skin: Negative for rash and wound.  Neurological: Negative for numbness.     Physical Exam Updated Vital Signs BP 129/68 (BP Location: Left Arm)   Pulse 93   Temp 98.5 F (36.9 C) (Oral)   Resp 18   Ht _0  (1.727 m)   Wt (!) 152.4 kg   LMP 07/28/2011   SpO2 100%   BMI 51.09 kg/m   Physical Exam  Constitutional: She is oriented to person, place, and time. She appears well-developed and well-nourished.  HENT:  Head: Normocephalic and atraumatic.  Nose: Nose  normal.  Eyes: Conjunctivae and EOM are normal. Pupils are equal, round, and reactive to light.  Neck: Normal range of motion. Neck supple.  Cardiovascular: Normal rate and normal heart sounds.   Pulmonary/Chest: Effort normal and breath sounds normal. No respiratory distress. She exhibits no tenderness.  Abdominal: Soft. Bowel sounds are normal. There is tenderness. There is no rebound, no guarding, no tenderness at McBurney's point and negative Murphy's sign.  Musculoskeletal: Normal range of motion.  Neurological: She is alert and oriented to person, place, and time.  Skin: Skin is warm. Capillary refill takes less than 2 seconds.  Psychiatric: She has a normal mood and affect. Her behavior is normal.  Nursing note and vitals reviewed.    ED Treatments / Results  Labs (all labs ordered are listed, but  only abnormal results are displayed) Labs Reviewed  COMPREHENSIVE METABOLIC PANEL - Abnormal; Notable for the following:       Result Value   Glucose, Bld 251 (*)    Calcium 8.8 (*)    All other components within normal limits  CBC - Abnormal; Notable for the following:    WBC 12.6 (*)    RBC 5.15 (*)    All other components within normal limits  URINALYSIS, ROUTINE W REFLEX MICROSCOPIC - Abnormal; Notable for the following:    Glucose, UA 150 (*)    Hgb urine dipstick SMALL (*)    Ketones, ur 20 (*)    Leukocytes, UA MODERATE (*)    Bacteria, UA RARE (*)    Squamous Epithelial / LPF 0-5 (*)    All other components within normal limits  LIPASE, BLOOD  PREGNANCY, URINE    EKG  EKG Interpretation None       Radiology No results found.  Procedures Procedures (including critical care time)  Medications Ordered in ED Medications  ondansetron (ZOFRAN-ODT) disintegrating tablet 4 mg (4 mg Oral Given 04/20/16 0322)  acetaminophen (TYLENOL) tablet 650 mg (650 mg Oral Given 04/20/16 0716)     Initial Impression / Assessment and Plan / ED Course  I have reviewed the  triage vital signs and the nursing notes.  Pertinent labs & imaging results that were available during my care of the patient were reviewed by me and considered in my medical decision making (see chart for details).  Clinical Course   Patient is a 40 year old female presenting with nausea, vomiting, diarrhea since yesterday 6 PM. On exam patient asleep. Patient afebrile, hemodynamically stable, in no apparent distress. Heart and lung sounds clear. Abdomen mildly tender in the epigastric region, otherwise soft and nontender. No rebound or guarding. Negative for Murphy sign. No focal tenderness at McBurney's point. Labs otherwise normal, with no signs of electrolyte imbalance or change in LFT's. Lipase negative. Urine slightly abnormal. Patient denied dysuria, difficulty urinating, frequency, urgency. White blood cell count slightly high at 12.6. Patient's symptoms likely due to a gastroenteritis, likely viral etiology. Low suspicion for appendicitis at this time or gallbladder pathology. Patient's vital signs improved prior to discharge. Patient afebrile, vital signs stable.  Patient will be discharged with Zofran and strict instructions to follow up with primary care physician and if she develops any urinary symptoms. Patient agrees with assessment and plan.   Final Clinical Impressions(s) / ED Diagnoses   Final diagnoses:  Nausea vomiting and diarrhea    New Prescriptions Discharge Medication List as of 04/20/2016  7:51 AM    START taking these medications   Details  ondansetron (ZOFRAN ODT) 4 MG disintegrating tablet Take 1 tablet (4 mg total) by mouth every 8 (eight) hours as needed for nausea or vomiting., Starting Sat 04/20/2016, [place], [place] 04/20/16 6060    Daleen Bo, MD 04/20/16 [id number]

## 2016-04-20 NOTE — ED Triage Notes (Signed)
Patient comes to the ED for evaluation of n/v/d that started after lunch. Unsure if pregnant.  No blood in substances.  Patient A&Ox4

## 2016-04-20 NOTE — Discharge Instructions (Signed)
Please if you're having any urinary symptoms and for follow-up for this visit. Take Zofran as needed for nausea and vomiting. Take Tylenol as needed for fever.   Get help right away if: You have chest pain. You feel extremely weak or you faint. You have bloody or black stools or stools that look like tar. You have severe pain, cramping, or bloating in your abdomen. You have trouble breathing or you are breathing very quickly. Your heart is beating very quickly. Your skin feels cold and clammy. You feel confused. You have signs of dehydration, such as: Dark urine, very little urine, or no urine. Cracked lips. Dry mouth. Sunken eyes. Sleepiness. Weakness.

## 2016-04-23 ENCOUNTER — Encounter: Payer: Self-pay | Admitting: Internal Medicine

## 2016-04-23 ENCOUNTER — Other Ambulatory Visit (INDEPENDENT_AMBULATORY_CARE_PROVIDER_SITE_OTHER): Payer: BC Managed Care – PPO

## 2016-04-23 ENCOUNTER — Ambulatory Visit (INDEPENDENT_AMBULATORY_CARE_PROVIDER_SITE_OTHER): Payer: BC Managed Care – PPO | Admitting: Internal Medicine

## 2016-04-23 VITALS — BP 134/80 | HR 72 | Temp 97.9°F | Resp 14 | Ht 68.0 in | Wt 331.5 lb

## 2016-04-23 DIAGNOSIS — A084 Viral intestinal infection, unspecified: Secondary | ICD-10-CM

## 2016-04-23 DIAGNOSIS — I10 Essential (primary) hypertension: Secondary | ICD-10-CM | POA: Diagnosis not present

## 2016-04-23 DIAGNOSIS — E1165 Type 2 diabetes mellitus with hyperglycemia: Secondary | ICD-10-CM

## 2016-04-23 LAB — HEMOGLOBIN A1C: HEMOGLOBIN A1C: 8 % — AB (ref 4.6–6.5)

## 2016-04-23 MED ORDER — IBUPROFEN 800 MG PO TABS: 800.0000 mg | ORAL_TABLET | Freq: Three times a day (TID) | ORAL | 1 refills | Status: DC | PRN

## 2016-04-23 NOTE — Progress Notes (Signed)
Pre visit review using our clinic review tool, if applicable. No additional management support is needed unless otherwise documented below in the visit note. 

## 2016-04-23 NOTE — Patient Instructions (Signed)
We are checking the sugar levels today and will call you back with the results.    Food Choices to Help Relieve Diarrhea, Adult When you have diarrhea, the foods you eat and your eating habits are very important. Choosing the right foods and drinks can help relieve diarrhea. Also, because diarrhea can last up to 7 days, you need to replace lost fluids and electrolytes (such as sodium, potassium, and chloride) in order to help prevent dehydration. What general guidelines do I need to follow?  Slowly drink 1 cup (8 oz) of fluid for each episode of diarrhea. If you are getting enough fluid, your urine will be clear or pale yellow.  Eat starchy foods. Some good choices include white rice, white toast, pasta, low-fiber cereal, baked potatoes (without the skin), saltine crackers, and bagels.  Avoid large servings of any cooked vegetables.  Limit fruit to two servings per day. A serving is  cup or 1 small piece.  Choose foods with less than 2 g of fiber per serving.  Limit fats to less than 8 tsp (38 g) per day.  Avoid fried foods.  Eat foods that have probiotics in them. Probiotics can be found in certain dairy products.  Avoid foods and beverages that may increase the speed at which food moves through the stomach and intestines (gastrointestinal tract). Things to avoid include:  High-fiber foods, such as dried fruit, raw fruits and vegetables, nuts, seeds, and whole grain foods.  Spicy foods and high-fat foods.  Foods and beverages sweetened with high-fructose corn syrup, honey, or sugar alcohols such as xylitol, sorbitol, and mannitol. What foods are recommended? Grains  White rice. White, [place], or pita breads (fresh or toasted), including plain rolls, buns, or bagels. White pasta. Saltine, soda, or graham crackers. Pretzels. Low-fiber cereal. Cooked cereals made with water (such as cornmeal, farina, or cream cereals). Plain muffins. Matzo. Melba toast. Zwieback. Vegetables  Potatoes  (without the skin). Strained tomato and vegetable juices. Most well-cooked and canned vegetables without seeds. Tender lettuce. Fruits  Cooked or canned applesauce, apricots, cherries, fruit cocktail, grapefruit, peaches, pears, or plums. Fresh bananas, apples without skin, cherries, grapes, cantaloupe, grapefruit, peaches, oranges, or plums. Meat and Other Protein Products  Baked or boiled chicken. Eggs. Tofu. Fish. Seafood. Smooth peanut butter. Ground or well-cooked tender beef, ham, veal, lamb, pork, or poultry. Dairy  Plain yogurt, kefir, and unsweetened liquid yogurt. Lactose-free milk, buttermilk, or soy milk. Plain hard cheese. Beverages  Sport drinks. Clear broths. Diluted fruit juices (except prune). Regular, caffeine-free sodas such as ginger ale. Water. Decaffeinated teas. Oral rehydration solutions. Sugar-free beverages not sweetened with sugar alcohols. Other  Bouillon, broth, or soups made from recommended foods. The items listed above may not be a complete list of recommended foods or beverages. Contact your dietitian for more options.  What foods are not recommended? Grains  Whole grain, whole wheat, bran, or rye breads, rolls, pastas, crackers, and cereals. Wild or brown rice. Cereals that contain more than 2 g of fiber per serving. Corn tortillas or taco shells. Cooked or dry oatmeal. Granola. Popcorn. Vegetables  Raw vegetables. Cabbage, broccoli, Brussels sprouts, artichokes, baked beans, beet greens, corn, kale, legumes, peas, sweet potatoes, and yams. Potato skins. Cooked spinach and cabbage. Fruits  Dried fruit, including raisins and dates. Raw fruits. Stewed or dried prunes. Fresh apples with skin, apricots, mangoes, pears, raspberries, and strawberries. Meat and Other Protein Products  Chunky peanut butter. Nuts and seeds. Beans and lentils. Tomasa Blase. Dairy  High-fat cheeses.  Milk, chocolate milk, and beverages made with milk, such as milk shakes. Cream. Ice  cream. Sweets and Desserts  Sweet rolls, doughnuts, and sweet breads. Pancakes and waffles. Fats and Oils  Butter. Cream sauces. Margarine. Salad oils. Plain salad dressings. Olives. Avocados. Beverages  Caffeinated beverages (such as coffee, tea, soda, or energy drinks). Alcoholic beverages. Fruit juices with pulp. Prune juice. Soft drinks sweetened with high-fructose corn syrup or sugar alcohols. Other  Coconut. Hot sauce. Chili powder. Mayonnaise. Gravy. Cream-based or milk-based soups. The items listed above may not be a complete list of foods and beverages to avoid. Contact your dietitian for more information.  What should I do if I become dehydrated? Diarrhea can sometimes lead to dehydration. Signs of dehydration include dark urine and dry mouth and skin. If you think you are dehydrated, you should rehydrate with an oral rehydration solution. These solutions can be purchased at pharmacies, retail stores, or online. Drink -1 cup (120-240 mL) of oral rehydration solution each time you have an episode of diarrhea. If drinking this amount makes your diarrhea worse, try drinking smaller amounts more often. For example, drink 1-3 tsp (5-15 mL) every 5-10 minutes. A general rule for staying hydrated is to drink 1-2 L of fluid per day. Talk to your health care provider about the specific amount you should be drinking each day. Drink enough fluids to keep your urine clear or pale yellow. This information is not intended to replace advice given to you by your health care provider. Make sure you discuss any questions you have with your health care provider. Document Released: 06/29/2003 Document Revised: 09/14/2015 Document Reviewed: 03/01/2013 Elsevier Interactive Patient Education  2017 [employer]

## 2016-04-23 NOTE — Progress Notes (Signed)
   Subjective:    Patient ID: Debra [name], female    DOB: 12/08/1975, 41 y.o.   MRN: [id number]  HPI The patient is a 41 YO female coming in for follow up of several things including her hypertension (taking her meds regularly, BP overall better at home, taking losartan only, no headaches or chest pains), and her recent stomach bug (she was seen last Friday with vomiting and diarrhea, she is still hesitant with what to eat, no more vomiting, no nausea, drinking well, some loose stools still with trying to eat meals), and her diabetes (she has not had follow up for this in some time, does not check her sugars, taking glipizide and trulicity, no new numbness or problems with her feet).   Review of Systems  Constitutional: Positive for activity change, appetite change and fatigue. Negative for chills, fever and unexpected weight change.  HENT: Negative.   Eyes: Negative.   Respiratory: Negative.   Cardiovascular: Negative.   Gastrointestinal: Positive for diarrhea and nausea. Negative for abdominal distention, abdominal pain, constipation and vomiting.  Endocrine: Negative.   Musculoskeletal: Negative.   Skin: Negative.   Neurological: Negative.       Objective:   Physical Exam  Constitutional: She is oriented to person, place, and time. She appears well-developed and well-nourished.  HENT:  Head: Normocephalic and atraumatic.  Eyes: EOM are normal.  Neck: Normal range of motion.  Cardiovascular: Normal rate and regular rhythm.   Pulmonary/Chest: Effort normal and breath sounds normal. No respiratory distress. She has no wheezes. She has no rales.  Abdominal: Soft. Bowel sounds are normal. She exhibits no distension. There is no tenderness. There is no rebound.  Lymphadenopathy:    She has no cervical adenopathy.  Neurological: She is alert and oriented to person, place, and time. Coordination normal.  Skin: Skin is warm and dry.   Vitals:   04/23/16 0857 04/23/16 0930  BP: (!)  150/78 134/80  Pulse: 72   Resp: 14   Temp: 97.9 F (36.6 C)   TempSrc: Oral   SpO2: 100%   Weight: (!) 331 lb 8 oz (150.4 kg)   Height: 5\' 8"  (1.727 m)       Assessment & Plan:

## 2016-04-25 DIAGNOSIS — A084 Viral intestinal infection, unspecified: Secondary | ICD-10-CM | POA: Insufficient documentation

## 2016-04-25 NOTE — Assessment & Plan Note (Signed)
She is staying well hydrated and we talked about skipping her glipizide if she is not eating as she can get low sugars. Given info on the brat diet to help her start back some foods.

## 2016-04-25 NOTE — Assessment & Plan Note (Signed)
Checking HgA1c, taking trulicity and glipizide, no low sugars. No new complications. Last was uncontrolled.

## 2016-04-25 NOTE — Assessment & Plan Note (Signed)
BP at goal today which is much improved from prior. Taking her losartan, okay to refill her ibuprofen since her BP is at goal.

## 2016-04-30 ENCOUNTER — Encounter (HOSPITAL_COMMUNITY): Payer: Self-pay

## 2016-05-07 ENCOUNTER — Other Ambulatory Visit: Payer: Self-pay | Admitting: Obstetrics and Gynecology

## 2016-05-07 DIAGNOSIS — Z1231 Encounter for screening mammogram for malignant neoplasm of breast: Secondary | ICD-10-CM

## 2016-05-08 ENCOUNTER — Ambulatory Visit: Payer: BC Managed Care – PPO | Admitting: Internal Medicine

## 2016-05-14 ENCOUNTER — Ambulatory Visit: Payer: BC Managed Care – PPO | Admitting: Internal Medicine

## 2016-05-20 ENCOUNTER — Ambulatory Visit (INDEPENDENT_AMBULATORY_CARE_PROVIDER_SITE_OTHER): Payer: BC Managed Care – PPO | Admitting: Internal Medicine

## 2016-05-20 ENCOUNTER — Encounter: Payer: Self-pay | Admitting: Internal Medicine

## 2016-05-20 DIAGNOSIS — G8929 Other chronic pain: Secondary | ICD-10-CM

## 2016-05-20 DIAGNOSIS — M25561 Pain in right knee: Secondary | ICD-10-CM

## 2016-05-20 DIAGNOSIS — M25512 Pain in left shoulder: Secondary | ICD-10-CM | POA: Diagnosis not present

## 2016-05-20 MED ORDER — CYCLOBENZAPRINE HCL 5 MG PO TABS: 5.0000 mg | ORAL_TABLET | Freq: Three times a day (TID) | ORAL | 1 refills | Status: DC | PRN

## 2016-05-20 NOTE — Progress Notes (Signed)
Pre visit review using our clinic review tool, if applicable. No additional management support is needed unless otherwise documented below in the visit note. 

## 2016-05-20 NOTE — Patient Instructions (Signed)
We have given you the prescription for the muscle relaxer called flexeril. You can take 1 pill up to 3 times per day for the pain.   It is good to keep taking the ibuprofen as well to help this to heal quicker.   This could take 2-4 weeks to heal totally.   Unfortunately I cannot fill out those forms because I do not certify disability and have not treated you for the knee injury. These should come from Dr. Rennis ChrisSupple.
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
## 2016-05-21 DIAGNOSIS — M25512 Pain in left shoulder: Secondary | ICD-10-CM | POA: Insufficient documentation

## 2016-05-21 NOTE — Assessment & Plan Note (Signed)
Likely muscular strain, rx for flexeril which is on $4 list at [facility] and given her rx to take there. She can continue to take ibuprofen as well for pain.

## 2016-05-21 NOTE — Assessment & Plan Note (Signed)
Since she has seen ortho for this we cannot fill out these forms since we have not treated her for the condition it is not appropriate. I have advised her that I cannot certify her disabled and that she will need to see her orthopedic specialist if she needs to get these forms filled out.

## 2016-06-06 ENCOUNTER — Ambulatory Visit
Admission: RE | Admit: 2016-06-06 | Discharge: 2016-06-06 | Disposition: A | Discharge status: Home / Self Care | Payer: BC Managed Care – PPO | Source: Ambulatory Visit | Attending: Obstetrics and Gynecology | Admitting: Obstetrics and Gynecology

## 2016-06-06 DIAGNOSIS — Z1231 Encounter for screening mammogram for malignant neoplasm of breast: Secondary | ICD-10-CM

## 2016-06-15 ENCOUNTER — Other Ambulatory Visit: Payer: Self-pay | Admitting: Internal Medicine

## 2016-06-21 ENCOUNTER — Encounter: Payer: Self-pay | Admitting: Family

## 2016-06-21 ENCOUNTER — Ambulatory Visit (INDEPENDENT_AMBULATORY_CARE_PROVIDER_SITE_OTHER): Payer: BC Managed Care – PPO | Admitting: Family

## 2016-06-21 VITALS — BP 148/96 | HR 74 | Temp 98.3°F | Resp 16 | Ht 68.0 in | Wt 336.0 lb

## 2016-06-21 DIAGNOSIS — L02213 Cutaneous abscess of chest wall: Secondary | ICD-10-CM | POA: Insufficient documentation

## 2016-06-21 NOTE — Progress Notes (Signed)
Subjective:    Patient ID: Debra [name], female    DOB: 01-06-1976, 41 y.o.   MRN: [id number]  Chief Complaint  Patient presents with  . Cyst    cyst under breast, has had it in the past, very painful    HPI:  Debra [name] is a 41 y.o. female who  has a past medical history of Allergic rhinitis, cause unspecified; Asthma; Diabetes mellitus type II (08/2009 dx); Endometriosis; Hypertension; Knee pain, right; Morbid obesity ([place]); and Ovarian cyst. and presents today for an acute office visit.  This is a new problem. Associated symptom of a cyst located under her right breast that was previously previously incised and drained and has noted a change in text and pain within the last 4 days. No fevers. Most recent mammogram was normal. Denies any modifying factors or attempted treatments. Symptoms are gradually worsening since initial onset.   Allergies  Allergen Reactions  . Influenza Vaccines   . Morphine Hives      Outpatient Medications Prior to Visit  Medication Sig Dispense Refill  . albuterol (PROAIR HFA) 108 (90 BASE) MCG/ACT inhaler Inhale 2 puffs into the lungs every 4 (four) hours as needed for wheezing or shortness of breath. 18 g 2  . albuterol (PROVENTIL) (2.5 MG/3ML) 0.083% nebulizer solution Take 2.5 mg by nebulization every 6 (six) hours as needed for wheezing or shortness of breath.    . blood glucose meter kit and supplies Dispense based on patient and insurance preference. Use up to four times daily as directed. (FOR ICD-9 250.00, 250.01). 1 each 0  . Dulaglutide (TRULICITY) 1.5 YH/0.6CB SOPN Inject 1.5 mg into the skin once a week. 4 pen 6  . fexofenadine (ALLEGRA) 180 MG tablet Take 180 mg by mouth daily as needed. For seasonal allergies    . Fluticasone-Salmeterol (ADVAIR DISKUS) 250-50 MCG/DOSE AEPB Inhale 1 puff into the lungs 2 (two) times daily. 1 each 3  . glipiZIDE (GLUCOTROL) 5 MG tablet TAKE 1 TABLET(5 MG) BY MOUTH TWICE DAILY BEFORE A MEAL 60 tablet 2    . glucose blood (ONE TOUCH ULTRA TEST) test strip Use as directed 100 each 2  . ibuprofen (ADVIL,MOTRIN) 800 MG tablet Take 1 tablet (800 mg total) by mouth every 8 (eight) hours as needed. 90 tablet 1  . losartan (COZAAR) 100 MG tablet TAKE 1 TABLET BY MOUTH DAILY 90 tablet 3  . mometasone (NASONEX) 50 MCG/ACT nasal spray Place 2 sprays into the nose daily as needed. For nasal spray    . norethindrone (AYGESTIN) 5 MG tablet Take 1 by mouth daily    . olopatadine (PATANOL) 0.1 % ophthalmic solution Place 1 drop into both eyes 2 (two) times daily. 5 mL 12  . cyclobenzaprine (FLEXERIL) 5 MG tablet Take 1 tablet (5 mg total) by mouth 3 (three) times daily as needed for muscle spasms. 60 tablet 1  . ondansetron (ZOFRAN ODT) 4 MG disintegrating tablet Take 1 tablet (4 mg total) by mouth every 8 (eight) hours as needed for nausea or vomiting. 20 tablet 0   No facility-administered medications prior to visit.       Past Surgical History:  Procedure Laterality Date  . ABDOMINAL HYSTERECTOMY  05/14/12   [facility]  . LAPAROSCOPIC GASTRIC BANDING  10/02/2010   start weight 349#  . SALPINGOOPHORECTOMY  10/08/11   right      Past Medical History:  Diagnosis Date  . Allergic rhinitis, cause unspecified   . Asthma   .  Diabetes mellitus type II 08/2009 dx  . Endometriosis   . Hypertension   . Knee pain, right    DJD, post traumatic with repeat falls  . Morbid obesity ([place])    s/p lab band 09/2010  . Ovarian cyst    right s/p resection June 2013      Review of Systems  Constitutional: Negative for chills and fever.  Skin:       Positive for abscess.      Objective:    BP (!) 148/96 (BP Location: Left Arm, Patient Position: Sitting, Cuff Size: Large)   Pulse 74   Temp 98.3 F (36.8 C) (Oral)   Resp 16   Ht _0  (1.727 m)   Wt (!) 336 lb (152.4 kg)   LMP 07/28/2011   SpO2 98%   BMI 51.09 kg/m  Nursing note and vital signs reviewed.  Physical Exam   Constitutional: She is oriented to person, place, and time. She appears well-developed and well-nourished. No distress.  Cardiovascular: Normal rate, regular rhythm, normal heart sounds and intact distal pulses.   Pulmonary/Chest: Effort normal and breath sounds normal.  Neurological: She is alert and oriented to person, place, and time.  Skin: Skin is warm and dry.  Abscess located on the right breast that is firm and tender to the touch. No discharge.  Psychiatric: She has a normal mood and affect. Her behavior is normal. Judgment and thought content normal.   Procedure: Incision and draining of abscess  Risks and alternative treatments discussed with patient providing verbal consent with wish to continue with incision and drainage. The patient was placed in supine position and draped accordingly. A chaperone was present for the entirety of the procedure. The site was identified and marked. It was cleansed with betadine applied in concentric circles and allowed to dry. Skin anesthesia was applied using a cold spray with localized anesthesia with 2% lidocaine with epinephrine. Once anesthesia obtained and verified a #11 blade was used to make an incision into the abscess with immediate return of purulent discharge. Following drainage, the site was explored with forceps to break up loculations. The wound was then irrigated with saline and a dressing was applied. The procedure was completed without complication and tolerated well. Post care instructions were provided.      Assessment & Plan:   Problem List Items Addressed This Visit      Other   Abscess of chest wall - Primary    Symptoms and exam consistent with abscess of the chest wall with successful incision and drainage. No indication for packing. Discussed basic wound care. OTC medications as needed for discomfort. Monitor for signs of infection. Follow up as needed.           I have discontinued Ms. Eshelman's ondansetron and  cyclobenzaprine. I am also having her maintain her fexofenadine, mometasone, Fluticasone-Salmeterol, olopatadine, norethindrone, glucose blood, blood glucose meter kit and supplies, albuterol, albuterol, Dulaglutide, losartan, ibuprofen, and glipiZIDE.   Follow-up: Return if symptoms worsen or fail to improve.  Mauricio Po, FNP

## 2016-06-21 NOTE — Assessment & Plan Note (Signed)
Symptoms and exam consistent with abscess of the chest wall with successful incision and drainage. No indication for packing. Discussed basic wound care. OTC medications as needed for discomfort. Monitor for signs of infection. Follow up as needed.

## 2016-06-21 NOTE — Patient Instructions (Signed)
Thank you for choosing [facility].  SUMMARY AND INSTRUCTIONS:  Keep bandage on for 24 hours. May change if needed. Keep covered with a bandage.   Keep clean and dry with soap and water. No alcohol or peroxide.   May continue to ooze slightly for the next 24-48 hours.   Monitor for signs of infection including rednesss, swelling, increased pain, or copious discharge.  Tylenol or ibuprofen as needed for discomfort.   Follow up:  If your symptoms worsen or fail to improve, please contact our office for further instruction, or in case of emergency go directly to the emergency room at the closest medical facility.

## 2016-07-13 ENCOUNTER — Emergency Department (HOSPITAL_COMMUNITY)
Admission: EM | Admit: 2016-07-13 | Discharge: 2016-07-13 | Disposition: A | Discharge status: Home / Self Care | Payer: BC Managed Care – PPO | Attending: Emergency Medicine | Admitting: Emergency Medicine

## 2016-07-13 ENCOUNTER — Encounter (HOSPITAL_COMMUNITY): Payer: Self-pay

## 2016-07-13 DIAGNOSIS — E119 Type 2 diabetes mellitus without complications: Secondary | ICD-10-CM | POA: Diagnosis not present

## 2016-07-13 DIAGNOSIS — J45909 Unspecified asthma, uncomplicated: Secondary | ICD-10-CM | POA: Diagnosis not present

## 2016-07-13 DIAGNOSIS — Z7984 Long term (current) use of oral hypoglycemic drugs: Secondary | ICD-10-CM | POA: Diagnosis not present

## 2016-07-13 DIAGNOSIS — L500 Allergic urticaria: Secondary | ICD-10-CM | POA: Insufficient documentation

## 2016-07-13 DIAGNOSIS — Z79899 Other long term (current) drug therapy: Secondary | ICD-10-CM | POA: Diagnosis not present

## 2016-07-13 DIAGNOSIS — R21 Rash and other nonspecific skin eruption: Secondary | ICD-10-CM | POA: Diagnosis present

## 2016-07-13 DIAGNOSIS — L509 Urticaria, unspecified: Secondary | ICD-10-CM

## 2016-07-13 MED ORDER — DEXAMETHASONE SODIUM PHOSPHATE 10 MG/ML IJ SOLN
10.0000 mg | Freq: Once | INTRAMUSCULAR | Status: AC
  Administered 2016-07-13: 10 mg via INTRAMUSCULAR
  Filled 2016-07-13: qty 1

## 2016-07-13 MED ORDER — DIPHENHYDRAMINE HCL 25 MG PO CAPS
50.0000 mg | ORAL_CAPSULE | Freq: Once | ORAL | Status: AC
  Administered 2016-07-13: 50 mg via ORAL
  Filled 2016-07-13: qty 2

## 2016-07-13 MED ORDER — DIPHENHYDRAMINE HCL 25 MG PO CAPS: 25.0000 mg | ORAL_CAPSULE | Freq: Four times a day (QID) | ORAL | 0 refills | Status: DC | PRN

## 2016-07-13 MED ORDER — FAMOTIDINE 20 MG PO TABS
20.0000 mg | ORAL_TABLET | Freq: Once | ORAL | Status: AC
  Administered 2016-07-13: 20 mg via ORAL
  Filled 2016-07-13: qty 1

## 2016-07-13 NOTE — ED Provider Notes (Signed)
[facility] DEPT Provider Note   CSN: [id number] Arrival date & time: 07/13/16  2145   By signing my name below, I, Eunice Blase, attest that this documentation has been prepared under the direction and in the presence of Shary Decamp, PA-C. Electronically Signed: Eunice Blase, Scribe. 07/13/16. 10:30 PM.   History   Chief Complaint Chief Complaint  Patient presents with  . Allergic Reaction   The history is provided by the patient and medical records. No language interpreter was used.    HPI Comments: Debra [name] is a 41 y.o. female with Hx of NIDDM and Asthma who presents to the Emergency Department complaining of rash in multiple locations onset last night. She notes itchy hives with progressive warmth ad redness to bilateral arms, right chest and left ankle. Pt notes multiple allergies including dust and trees. She adds there is a lot of dust at her new job. Pt has taken Allegra, wich normally provides relief, but she states it has not provided relief during this episode. Pt denies suspicious intake, stridor, SOB or new products.  Past Medical History:  Diagnosis Date  . Allergic rhinitis, cause unspecified   . Asthma   . Diabetes mellitus type II 08/2009 dx  . Endometriosis   . Hypertension   . Knee pain, right    DJD, post traumatic with repeat falls  . Morbid obesity ([place])    s/p lab band 09/2010  . Ovarian cyst    right s/p resection June 2013    Patient Active Problem List   Diagnosis Date Noted  . Abscess of chest wall 06/21/2016  . Left shoulder pain 05/21/2016  . Routine general medical examination at a health care facility 10/13/2015  . Endometriosis 11/03/2013  . Right knee pain   . Hypertension 08/26/2011  . Hx of laparoscopic gastric banding, 10/02/2010. 11/27/2010  . Diabetes mellitus type 2, uncontrolled ([place]) 08/23/2009  . Morbid obesity ([place]) 08/22/2009  . Allergic rhinitis 08/22/2009  . Asthma 08/22/2009  . ARTHRITIS 08/22/2009    Past  Surgical History:  Procedure Laterality Date  . ABDOMINAL HYSTERECTOMY  05/14/12   [facility]  . LAPAROSCOPIC GASTRIC BANDING  10/02/2010   start weight 349#  . SALPINGOOPHORECTOMY  10/08/11   right    OB History    No data available       Home Medications    Prior to Admission medications   Medication Sig Start Date End Date Taking? Authorizing Provider  albuterol (PROAIR HFA) 108 (90 BASE) MCG/ACT inhaler Inhale 2 puffs into the lungs every 4 (four) hours as needed for wheezing or shortness of breath. 04/18/15   Rowe Clack, MD  albuterol (PROVENTIL) (2.5 MG/3ML) 0.083% nebulizer solution Take 2.5 mg by nebulization every 6 (six) hours as needed for wheezing or shortness of breath.    Historical Provider, MD  blood glucose meter kit and supplies Dispense based on patient and insurance preference. Use up to four times daily as directed. (FOR ICD-9 250.00, 250.01). 06/03/14   Rowe Clack, MD  Dulaglutide (TRULICITY) 1.5 WU/1.3KG SOPN Inject 1.5 mg into the skin once a week. 10/30/15   Hoyt Koch, MD  fexofenadine (ALLEGRA) 180 MG tablet Take 180 mg by mouth daily as needed. For seasonal allergies    Historical Provider, MD  Fluticasone-Salmeterol (ADVAIR DISKUS) 250-50 MCG/DOSE AEPB Inhale 1 puff into the lungs 2 (two) times daily. 07/22/11   Rowe Clack, MD  glipiZIDE (GLUCOTROL) 5 MG tablet TAKE 1 TABLET(5 MG)  BY MOUTH TWICE DAILY BEFORE A MEAL 06/17/16   Hoyt Koch, MD  glucose blood (ONE TOUCH ULTRA TEST) test strip Use as directed 10/28/13   Rowe Clack, MD  ibuprofen (ADVIL,MOTRIN) 800 MG tablet Take 1 tablet (800 mg total) by mouth every 8 (eight) hours as needed. 04/23/16   Hoyt Koch, MD  losartan (COZAAR) 100 MG tablet TAKE 1 TABLET BY MOUTH DAILY 11/03/15   Hoyt Koch, MD  mometasone (NASONEX) 50 MCG/ACT nasal spray Place 2 sprays into the nose daily as needed. For nasal spray    Historical Provider, MD    norethindrone (AYGESTIN) 5 MG tablet Take 1 by mouth daily 07/06/13   Historical Provider, MD  olopatadine (PATANOL) 0.1 % ophthalmic solution Place 1 drop into both eyes 2 (two) times daily. 08/08/12   Darlyne Russian, MD    Family History Family History  Problem Relation Age of Onset  . Heart disease Mother   . Hypertension Mother   . Diabetes Mother   . Stroke Father 55  . Diabetes Father     Social History Social History  Substance Use Topics  . Smoking status: Never Smoker  . Smokeless tobacco: Never Used  . Alcohol use No     Allergies   Influenza vaccines and Morphine   Review of Systems Review of Systems  Respiratory: Negative for shortness of breath and stridor.   Skin: Positive for color change and rash.     Physical Exam Updated Vital Signs BP (!) 188/102   Pulse 66   Temp 98.9 F (37.2 C) (Oral)   Resp 16   LMP 07/28/2011   SpO2 98%   Physical Exam  Constitutional: She is oriented to person, place, and time. She appears well-developed and well-nourished.  HENT:  Head: Normocephalic and atraumatic.  Pt phonating well; no signs of airway compromise  Eyes: Conjunctivae are normal. Pupils are equal, round, and reactive to light. Right eye exhibits no discharge. Left eye exhibits no discharge. No scleral icterus.  Neck: Normal range of motion. No JVD present. No tracheal deviation present.  Pulmonary/Chest: Effort normal. No stridor.  Neurological: She is alert and oriented to person, place, and time. Coordination normal.  Skin: Skin is warm and dry. Rash noted. There is erythema.  Diffuse urticarial rash noted anterior R chest, posterior bilateral arms and LLE; hives noted  Psychiatric: She has a normal mood and affect. Her behavior is normal. Judgment and thought content normal.  Nursing note and vitals reviewed.    ED Treatments / Results  DIAGNOSTIC STUDIES: Oxygen Saturation is 98% on RA, normal by my interpretation.    COORDINATION OF  CARE: 10:27 PM Discussed treatment plan with pt at bedside and pt agreed to plan. Will order medication.  Labs (all labs ordered are listed, but only abnormal results are displayed) Labs Reviewed - No data to display  EKG  EKG Interpretation None       Radiology No results found.  Procedures Procedures (including critical care time)  Medications Ordered in ED Medications  dexamethasone (DECADRON) injection 10 mg (not administered)  diphenhydrAMINE (BENADRYL) capsule 50 mg (not administered)  famotidine (PEPCID) tablet 20 mg (not administered)     Initial Impression / Assessment and Plan / ED Course  I have reviewed the triage vital signs and the nursing notes.  Pertinent labs & imaging results that were available during my care of the patient were reviewed by me and considered in my medical decision making (  see chart for details).  Final Clinical Impressions(s) / ED Diagnoses     {I have reviewed the relevant previous healthcare records.  {I obtained HPI from historian.   ED Course:  Assessment: Pt is a 41 y.o. female who presents with diffuse urticarial rash since last night. Hives noted. Phonating well without signs of respiratory compromise. On exam, pt in NAD. Nontoxic/nonseptic appearing. VSS. Afebrile. Hives noted diffusely. Given benadryl, steroids, and famotidine in ED. Given Rx Benadryl. Plan is to DC home with follow up to Allergist or PCP. At time of discharge, Patient is in no acute distress. Vital Signs are stable. Patient is able to ambulate. Patient able to tolerate PO.   Disposition/Plan:  DC Home Additional Verbal discharge instructions given and discussed with patient.  Pt Instructed to f/u with PCP in the next week for evaluation and treatment of symptoms. Return precautions given Pt acknowledges and agrees with plan  Supervising Physician Nat Christen, MD  Final diagnoses:  Urticarial rash    New Prescriptions New Prescriptions   No medications  on file   I personally performed the services described in this documentation, which was scribed in my presence. The recorded information has been reviewed and is accurate.    Shary Decamp, PA-C 07/13/16 [id number]    Nat Christen, MD 07/14/16 [phone]

## 2016-07-13 NOTE — Discharge Instructions (Signed)
Please read and follow all provided instructions.  Your diagnoses today include:  1. Urticarial rash     Tests performed today include: Vital signs. See below for your results today.   Medications prescribed:  Take as prescribed   Home care instructions:  Follow any educational materials contained in this packet.  Follow-up instructions: Please follow-up with your primary care provider for further evaluation of symptoms and treatment   Return instructions:  Please return to the Emergency Department if you do not get better, if you get worse, or new symptoms OR  - Fever (temperature greater than 101.30F)  - Bleeding that does not stop with holding pressure to the area    -Severe pain (please note that you may be more sore the day after your accident)  - Chest Pain  - Difficulty breathing  - Severe nausea or vomiting  - Inability to tolerate food and liquids  - Passing out  - Skin becoming red around your wounds  - Change in mental status (confusion or lethargy)  - New numbness or weakness    Please return if you have any other emergent concerns.  Additional Information:  Your vital signs today were: BP (!) 188/102    Pulse 66    Temp 98.9 F (37.2 C) (Oral)    Resp 16    LMP 07/28/2011    SpO2 98%  If your blood pressure (BP) was elevated above 135/85 this visit, please have this repeated by your doctor within one month. ---------------

## 2016-07-13 NOTE — ED Triage Notes (Signed)
Pt states having allergic reaction. Pt states began last night, states has 5 large hives to body. Pt with redness to foot, each arm and R side of chest. Pt with no stridor or difficulty breathing at triage. Pt states hx of multiple allergies. Pt denies any new soaps, detergents or perfumes.

## 2016-09-23 ENCOUNTER — Encounter: Payer: Self-pay | Admitting: Family Medicine

## 2016-09-23 ENCOUNTER — Ambulatory Visit (INDEPENDENT_AMBULATORY_CARE_PROVIDER_SITE_OTHER): Payer: BC Managed Care – PPO | Admitting: Family Medicine

## 2016-09-23 VITALS — BP 148/84 | HR 62 | Temp 98.9°F | Wt 334.4 lb

## 2016-09-23 DIAGNOSIS — J069 Acute upper respiratory infection, unspecified: Secondary | ICD-10-CM

## 2016-09-23 MED ORDER — FLUTICASONE PROPIONATE 50 MCG/ACT NA SUSP: 2.0000 | Freq: Every day | NASAL | 6 refills | Status: DC

## 2016-09-23 MED ORDER — BENZONATATE 100 MG PO CAPS: 100.0000 mg | ORAL_CAPSULE | Freq: Three times a day (TID) | ORAL | 0 refills | Status: DC

## 2016-09-23 NOTE — Patient Instructions (Addendum)
It was a pleasure to see you today. Your symptoms today are most likely caused by viral illness. Please drink plenty of water enough for your urine to be pale yellow or clear. You may use Tylenol 325 mg every 6 hours as needed.  Follow-up for evaluation if your symptoms do not improve in 3-4 days, worsen, or you develop a fever greater than 100.   Upper Respiratory Infection, Adult Most upper respiratory infections (URIs) are caused by a virus. A URI affects the nose, throat, and upper air passages. The most common type of URI is often called "the common cold." Follow these instructions at home:  Take medicines only as told by your doctor.  Gargle warm saltwater or take cough drops to comfort your throat as told by your doctor.  Use a warm mist humidifier or inhale steam from a shower to increase air moisture. This may make it easier to breathe.  Drink enough fluid to keep your pee (urine) clear or pale yellow.  Eat soups and other clear broths.  Have a healthy diet.  Rest as needed.  Go back to work when your fever is gone or your doctor says it is okay. ? You may need to stay home longer to avoid giving your URI to others. ? You can also wear a face mask and wash your hands often to prevent spread of the virus.  Use your inhaler more if you have asthma.  Do not use any tobacco products, including cigarettes, chewing tobacco, or electronic cigarettes. If you need help quitting, ask your doctor. Contact a doctor if:  You are getting worse, not better.  Your symptoms are not helped by medicine.  You have chills.  You are getting more short of breath.  You have brown or red mucus.  You have yellow or brown discharge from your nose.  You have pain in your face, especially when you bend forward.  You have a fever.  You have puffy (swollen) neck glands.  You have pain while swallowing.  You have white areas in the back of your throat. Get help right away if:  You have  very bad or constant: ? Headache. ? Ear pain. ? Pain in your forehead, behind your eyes, and over your cheekbones (sinus pain). ? Chest pain.  You have long-lasting (chronic) lung disease and any of the following: ? Wheezing. ? Long-lasting cough. ? Coughing up blood. ? A change in your usual mucus.  You have a stiff neck.  You have changes in your: ? Vision. ? Hearing. ? Thinking. ? Mood. This information is not intended to replace advice given to you by your health care provider. Make sure you discuss any questions you have with your health care provider. Document Released: 09/25/2007 Document Revised: 12/10/2015 Document Reviewed: 07/14/2013 Elsevier Interactive Patient Education  2018 Elsevier Inc.   WE NOW OFFER   [facility] FAST TRACK!!!  SAME DAY Appointments for ACUTE CARE  Such as: Sprains, Injuries, cuts, abrasions, rashes, muscle pain, joint pain, back pain Colds, flu, sore throats, headache, allergies, cough, fever  Ear pain, sinus and eye infections Abdominal pain, nausea, vomiting, diarrhea, upset stomach Animal/insect bites  3 Easy Ways to Schedule: Walk-In Scheduling Call in scheduling Mychart Sign-up: [email or web address]

## 2016-09-23 NOTE — Progress Notes (Signed)
Patient ID: Debra [name], female   DOB: 06-27-1975, 41 y.o.   MRN: [id number]  PCP: Hoyt Koch, MD  Subjective:  Debra [name] is a 41 y.o. year old very pleasant female patient who presents with symptoms including nasal congestion, post nasal drip, itchy ears, cough this is minimally productive of clear mucous.  Associated minor sinus pressure/pain. -started: 4 days ago, symptoms are not worsening but not improving at this point. -previous treatments: Allegra which has provided limited benefit.  Excedrin for sinus pressure which provided excellent benefit. Albuterol use two times which provided excellent benefit. No need for albuterol noted today. -sick contacts/travel/risks: denies flu exposure.  -Hx of: allergies and asthma No recent antibiotic use  ROS-denies fever, SOB, NVD, tooth pain  Pertinent Past Medical History- HTN, asthma, Type 2 DM  Medications- reviewed  Current Outpatient Prescriptions  Medication Sig Dispense Refill  . albuterol (PROAIR HFA) 108 (90 BASE) MCG/ACT inhaler Inhale 2 puffs into the lungs every 4 (four) hours as needed for wheezing or shortness of breath. 18 g 2  . albuterol (PROVENTIL) (2.5 MG/3ML) 0.083% nebulizer solution Take 2.5 mg by nebulization every 6 (six) hours as needed for wheezing or shortness of breath.    . blood glucose meter kit and supplies Dispense based on patient and insurance preference. Use up to four times daily as directed. (FOR ICD-9 250.00, 250.01). 1 each 0  . diphenhydrAMINE (BENADRYL) 25 mg capsule Take 1 capsule (25 mg total) by mouth every 6 (six) hours as needed. 30 capsule 0  . Dulaglutide (TRULICITY) 1.5 OA/4.1YS SOPN Inject 1.5 mg into the skin once a week. 4 pen 6  . fexofenadine (ALLEGRA) 180 MG tablet Take 180 mg by mouth daily as needed. For seasonal allergies    . Fluticasone-Salmeterol (ADVAIR DISKUS) 250-50 MCG/DOSE AEPB Inhale 1 puff into the lungs 2 (two) times daily. 1 each 3  . glipiZIDE (GLUCOTROL) 5  MG tablet TAKE 1 TABLET(5 MG) BY MOUTH TWICE DAILY BEFORE A MEAL 60 tablet 2  . glucose blood (ONE TOUCH ULTRA TEST) test strip Use as directed 100 each 2  . ibuprofen (ADVIL,MOTRIN) 800 MG tablet Take 1 tablet (800 mg total) by mouth every 8 (eight) hours as needed. 90 tablet 1  . losartan (COZAAR) 100 MG tablet TAKE 1 TABLET BY MOUTH DAILY 90 tablet 3  . mometasone (NASONEX) 50 MCG/ACT nasal spray Place 2 sprays into the nose daily as needed. For nasal spray    . norethindrone (AYGESTIN) 5 MG tablet Take 1 by mouth daily    . olopatadine (PATANOL) 0.1 % ophthalmic solution Place 1 drop into both eyes 2 (two) times daily. 5 mL 12   No current facility-administered medications for this visit.     Objective: BP (!) 148/84 (BP Location: Left Arm, Patient Position: Sitting, Cuff Size: Large)   Pulse 62   Temp 98.9 F (37.2 C) (Oral)   Wt (!) 334 lb 6.4 oz (151.7 kg)   LMP 07/28/2011   SpO2 99%   BMI 50.85 kg/m  Gen: NAD, resting comfortably HEENT: Turbinates erythematous, TM normal, pharynx mildly erythematous with no tonsilar exudate or edema, no sinus tenderness CV: RRR no murmurs rubs or gallops Lungs: CTAB no crackles, wheeze, rhonchi Abdomen: soft/nontender/nondistended/normal bowel sounds. No rebound or guarding.  Ext: no edema Skin: warm, dry, no rash Neuro: grossly normal, moves all extremities  Assessment/Plan:  Upper Respiratory infection History and exam today are suggestive of viral infection most likely due to upper  respiratory infection. Symptomatic treatment with: acetaminophen, flonase, and benzonatate for cough as needed. We discussed that we did not find any infection that had higher probability of being bacterial such as pneumonia or strep throat. We discussed signs that bacterial infection may have developed particularly fever or shortness of breath. Likely course of 1-2 weeks. Patient is contagious and advised good handwashing and consideration of mask If going to  be in public places.   Finally, we reviewed reasons to return to care including if symptoms worsen or persist or new concerns arise- once again particularly shortness of breath or fever.   Laurita Quint, FNP

## 2016-09-26 ENCOUNTER — Telehealth: Payer: Self-pay | Admitting: Internal Medicine

## 2016-09-26 NOTE — Telephone Encounter (Signed)
Pt is feeling worse then Monday, [facility] and she would like to try Prednisone to knock it out, Please call back     [facility] on [address]

## 2016-09-26 NOTE — Telephone Encounter (Signed)
If she was [facility] and is want the medication for what she was seen there for then she needs to call them

## 2016-09-27 NOTE — Telephone Encounter (Signed)
Pt is was advised by Elam to call us regarding this matter.  Advised pt that Raynelle FanningJulie is not here.  Pt is stating that the flonase is not working.  Pt is asking if she can get prednisone as it is usually the only thing that works.  Pt stated she forgot to mention that to BellaireJulie.  Please advise.

## 2016-09-27 NOTE — Telephone Encounter (Signed)
Pt is aware dr hunter will have prednisone call into pharm

## 2016-09-27 NOTE — Telephone Encounter (Signed)
I reviewed chart and noted  Lab Results  Component Value Date   HGBA1C 8.0 (H) 04/23/2016  She needs to have an a1c before prednisone is given and help with managing diabetes before using prednisone. Can we schedule her Monday with Raynelle FanningJulie?

## 2016-09-27 NOTE — Telephone Encounter (Signed)
Notified patient of MD response. Patient agreed to Monday appt.; scheduled with Raynelle FanningJulie fro 1200; patient also requesting cough medication; Dr. Durene CalHunter recommended patient to try OTC Delsym per label instructions; patient voiced understanding. Advised if symptoms worse; may seek urgent care over the weekend.

## 2016-09-30 ENCOUNTER — Ambulatory Visit (INDEPENDENT_AMBULATORY_CARE_PROVIDER_SITE_OTHER): Payer: BC Managed Care – PPO | Admitting: Family Medicine

## 2016-09-30 VITALS — BP 152/90 | HR 66 | Temp 99.0°F | Wt 334.0 lb

## 2016-09-30 DIAGNOSIS — E1165 Type 2 diabetes mellitus with hyperglycemia: Secondary | ICD-10-CM

## 2016-09-30 DIAGNOSIS — J0100 Acute maxillary sinusitis, unspecified: Secondary | ICD-10-CM

## 2016-09-30 DIAGNOSIS — IMO0001 Reserved for inherently not codable concepts without codable children: Secondary | ICD-10-CM

## 2016-09-30 LAB — POCT GLYCOSYLATED HEMOGLOBIN (HGB A1C): Hemoglobin A1C: 9.4

## 2016-09-30 MED ORDER — AMOXICILLIN-POT CLAVULANATE 875-125 MG PO TABS: 1.0000 | ORAL_TABLET | Freq: Two times a day (BID) | ORAL | 0 refills | Status: DC

## 2016-09-30 MED ORDER — FLUCONAZOLE 150 MG PO TABS: 150.0000 mg | ORAL_TABLET | Freq: Once | ORAL | 0 refills | Status: AC

## 2016-09-30 NOTE — Progress Notes (Signed)
Patient ID: Debra [name], female   DOB: [birth year], 41 y.o.   MRN: [id number]  PCP: Hoyt Koch, MD  Subjective:  Debra [name] is a 41 y.o. year old very pleasant female patient who presents with Upper Respiratory infection symptoms including nasal congestion, sore throat, cough that is productive of yellow mucous -started: 11 days ago, symptoms are not improving -previous treatments: benzonatate, advair, proair, Allegra -sick contacts/travel/risks: denies flu exposure. No recent antibiotic use -Hx of: allergies anad asthma  History of uncontrolled diabetes and she is requesting prednisone today for symptoms. She also states she has not been adherent to medications as she had not picked them up at her pharmacy. She denies polyuria, polyphagia, or polydipsia today. She has not checked her blood sugar today but reports that her A1C at her last check was improved. She states that she is due for a follow up with her PCP.  ROS-denies fever, SOB, NVD, tooth pain, maxillary sinus pressure, ear pressure/itching  Pertinent Past Medical History- HTN, asthma, T2DM  Medications- reviewed  Current Outpatient Prescriptions  Medication Sig Dispense Refill  . albuterol (PROAIR HFA) 108 (90 BASE) MCG/ACT inhaler Inhale 2 puffs into the lungs every 4 (four) hours as needed for wheezing or shortness of breath. 18 g 2  . albuterol (PROVENTIL) (2.5 MG/3ML) 0.083% nebulizer solution Take 2.5 mg by nebulization every 6 (six) hours as needed for wheezing or shortness of breath.    . benzonatate (TESSALON) 100 MG capsule Take 1 capsule (100 mg total) by mouth 3 (three) times daily. 20 capsule 0  . blood glucose meter kit and supplies Dispense based on patient and insurance preference. Use up to four times daily as directed. (FOR ICD-9 250.00, 250.01). 1 each 0  . diphenhydrAMINE (BENADRYL) 25 mg capsule Take 1 capsule (25 mg total) by mouth every 6 (six) hours as needed. 30 capsule 0  . Dulaglutide  (TRULICITY) 1.5 YT/0.1SW SOPN Inject 1.5 mg into the skin once a week. 4 pen 6  . fexofenadine (ALLEGRA) 180 MG tablet Take 180 mg by mouth daily as needed. For seasonal allergies    . fluticasone (FLONASE) 50 MCG/ACT nasal spray Place 2 sprays into both nostrils daily. 16 g 6  . Fluticasone-Salmeterol (ADVAIR DISKUS) 250-50 MCG/DOSE AEPB Inhale 1 puff into the lungs 2 (two) times daily. 1 each 3  . glipiZIDE (GLUCOTROL) 5 MG tablet TAKE 1 TABLET(5 MG) BY MOUTH TWICE DAILY BEFORE A MEAL 60 tablet 2  . glucose blood (ONE TOUCH ULTRA TEST) test strip Use as directed 100 each 2  . ibuprofen (ADVIL,MOTRIN) 800 MG tablet Take 1 tablet (800 mg total) by mouth every 8 (eight) hours as needed. 90 tablet 1  . losartan (COZAAR) 100 MG tablet TAKE 1 TABLET BY MOUTH DAILY 90 tablet 3  . mometasone (NASONEX) 50 MCG/ACT nasal spray Place 2 sprays into the nose daily as needed. For nasal spray    . norethindrone (AYGESTIN) 5 MG tablet Take 1 by mouth daily    . olopatadine (PATANOL) 0.1 % ophthalmic solution Place 1 drop into both eyes 2 (two) times daily. 5 mL 12   No current facility-administered medications for this visit.     Objective: BP (!) 152/90 (BP Location: Left Arm, Patient Position: Sitting, Cuff Size: Large)   Pulse 66   Temp 99 F (37.2 C) (Oral)   Wt (!) 334 lb (151.5 kg)   LMP 07/28/2011   SpO2 99%   BMI 50.78 kg/m  Gen: NAD,  resting comfortably HEENT: Turbinates erythematous, TM normal, pharynx mildly erythematous with no tonsilar exudate or edema, + maxillary sinus tenderness CV: RRR no murmurs rubs or gallops Lungs: CTAB no crackles, wheeze, rhonchi Abdomen: soft/nontender/nondistended/normal bowel sounds. No rebound or guarding.  Ext: no edema Skin: warm, dry, no rash Neuro: grossly normal, moves all extremities  Assessment/Plan: 1. Acute maxillary sinusitis, recurrence not specified Duration of symptoms that are not improving; low grade fever; will treat with Augmentin and  advised follow up if symptoms do not improve with treatment, worsen, or she develops a fever >100. Pulmonary exam is unremarkable. Provided fluconazole for history of post antibiotic candidiasis. - amoxicillin-clavulanate (AUGMENTIN) 875-125 MG tablet; Take 1 tablet by mouth 2 (two) times daily.  Dispense: 20 tablet; Refill: 0 - fluconazole (DIFLUCAN) 150 MG tablet; Take 1 tablet (150 mg total) by mouth once.  Dispense: 1 tablet; Refill: 0  2. Uncontrolled type 2 diabetes mellitus without complication, without long-term current use of insulin (HCC) Uncontrolled; POC A1C 9.4 today; advised her to adhere to treatment regimen that her PCP has provided.  Advised follow up with PCP this week or next for management of diabetes.  Also reviewed improvements in lifestyle choices that are needed with management of glucose control and long term complications if left untreated. She voiced understanding and agreed with plan. - POCT glycosylated hemoglobin (Hb A1C)   Finally, we reviewed reasons to return to care including if symptoms worsen or persist or new concerns arise- once again particularly shortness of breath or fever.     Ann , FNP  

## 2016-09-30 NOTE — Patient Instructions (Addendum)
It was a pleasure to see you today. Please take medication as directed with food and follow up with your provider as we discussed for management of diabetes. Also, please take trulicity and glipizide as directed and continue your asthma medications as ordered.   Sinusitis, Adult Sinusitis is soreness and inflammation of your sinuses. Sinuses are hollow spaces in the bones around your face. They are located:  Around your eyes.  In the middle of your forehead.  Behind your nose.  In your cheekbones.  Your sinuses and nasal passages are lined with a stringy fluid (mucus). Mucus normally drains out of your sinuses. When your nasal tissues get inflamed or swollen, the mucus can get trapped or blocked so air cannot flow through your sinuses. This lets bacteria, viruses, and funguses grow, and that leads to infection. Follow these instructions at home: Medicines  Take, use, or apply over-the-counter and prescription medicines only as told by your doctor. These may include nasal sprays.  If you were prescribed an antibiotic medicine, take it as told by your doctor. Do not stop taking the antibiotic even if you start to feel better. Hydrate and Humidify  Drink enough water to keep your pee (urine) clear or pale yellow.  Use a cool mist humidifier to keep the humidity level in your home above 50%.  Breathe in steam for 10-15 minutes, 3-4 times a day or as told by your doctor. You can do this in the bathroom while a hot shower is running.  Try not to spend time in cool or dry air. Rest  Rest as much as possible.  Sleep with your head raised (elevated).  Make sure to get enough sleep each night. General instructions  Put a warm, moist washcloth on your face 3-4 times a day or as told by your doctor. This will help with discomfort.  Wash your hands often with soap and water. If there is no soap and water, use hand sanitizer.  Do not smoke. Avoid being around people who are smoking  (secondhand smoke).  Keep all follow-up visits as told by your doctor. This is important. Contact a doctor if:  You have a fever.  Your symptoms get worse.  Your symptoms do not get better within 10 days. Get help right away if:  You have a very bad headache.  You cannot stop throwing up (vomiting).  You have pain or swelling around your face or eyes.  You have trouble seeing.  You feel confused.  Your neck is stiff.  You have trouble breathing. This information is not intended to replace advice given to you by your health care provider. Make sure you discuss any questions you have with your health care provider. Document Released: 09/25/2007 Document Revised: 12/03/2015 Document Reviewed: 02/01/2015 Elsevier Interactive Patient Education  2018 [employer]   WE NOW OFFER    [facility] FAST TRACK!!!  SAME DAY Appointments for ACUTE CARE  Such as: Sprains, Injuries, cuts, abrasions, rashes, muscle pain, joint pain, back pain Colds, flu, sore throats, headache, allergies, cough, fever  Ear pain, sinus and eye infections Abdominal pain, nausea, vomiting, diarrhea, upset stomach Animal/insect bites  3 Easy Ways to Schedule: Walk-In Scheduling Call in scheduling Mychart Sign-up: [email or web address]

## 2016-10-07 ENCOUNTER — Telehealth: Payer: Self-pay | Admitting: Internal Medicine

## 2016-10-07 MED ORDER — FLUCONAZOLE 150 MG PO TABS: 150.0000 mg | ORAL_TABLET | Freq: Once | ORAL | 0 refills | Status: AC

## 2016-10-07 NOTE — Telephone Encounter (Signed)
Routing to greg, please advise in dr crawfords absence, thanks 

## 2016-10-07 NOTE — Telephone Encounter (Signed)
Pt lost her fluconazole (DIFLUCAN) 150 MG tablet  [facility] [address]

## 2016-10-07 NOTE — Telephone Encounter (Signed)
Medication sent.

## 2016-10-18 ENCOUNTER — Ambulatory Visit (INDEPENDENT_AMBULATORY_CARE_PROVIDER_SITE_OTHER)
Admission: RE | Admit: 2016-10-18 | Discharge: 2016-10-18 | Disposition: A | Discharge status: Home / Self Care | Payer: BC Managed Care – PPO | Source: Ambulatory Visit | Attending: Family | Admitting: Family

## 2016-10-18 ENCOUNTER — Encounter: Payer: Self-pay | Admitting: Family

## 2016-10-18 ENCOUNTER — Ambulatory Visit (INDEPENDENT_AMBULATORY_CARE_PROVIDER_SITE_OTHER): Payer: BC Managed Care – PPO | Admitting: Family

## 2016-10-18 VITALS — BP 144/92 | HR 70 | Temp 99.1°F | Resp 18 | Ht 68.0 in | Wt 336.0 lb

## 2016-10-18 DIAGNOSIS — R059 Cough, unspecified: Secondary | ICD-10-CM

## 2016-10-18 DIAGNOSIS — R05 Cough: Secondary | ICD-10-CM

## 2016-10-18 DIAGNOSIS — M545 Low back pain, unspecified: Secondary | ICD-10-CM

## 2016-10-18 NOTE — Assessment & Plan Note (Signed)
New onset low back pain unlikely related to previous fall with symptoms consistent with paraspinal muscle imbalance. Treat conservatively with ice, moist heat and home exercise therapy. Continue previously prescribed ibuprofen. Obtain x-rays to check for acute process. Follow if symptoms worsen or do not improve.

## 2016-10-18 NOTE — Progress Notes (Signed)
Subjective:    Patient ID: Debra [name], female    DOB: 11/29/1975, 41 y.o.   MRN: [id number]  Chief Complaint  Patient presents with  . Back Pain    lower back pain states it has been bothering her for a while, it gets excrutiating when sitting for long periods of time, cough left over from previous cold    HPI:  Debra [name] is a 41 y.o. female who  has a past medical history of Allergic rhinitis, cause unspecified; Asthma; Diabetes mellitus type II (08/2009 dx); Endometriosis; Hypertension; Knee pain, right; Morbid obesity ([place]); and Ovarian cyst. and presents today for an office visit.   1.) Back pain - This is a new problem. When she sits on any type of surface she has pain located in her sacrum/coccyx region. Does report previous injury where she fell and bruised her coccyx bone. Describes this pain as being the same kind of pain. Now with no new trauma or injury. Symptoms have progressively worsened over the past 6 months. Modifying factors include ice, heat and ibuprofen which have helped for the moment. Treatments have been sporadic. Aggravated with sitting for longer than about 15-20 minutes and goes away after standing and returns upon sitting. No saddle anesthesia or numbness or tingling.  2.) Cough - This is a new problem. Associated symptom of a cough has been going on for about 1 month. She has completed a course of Augmentin and using Allegra at night. Usually gets a steroid shot and a Z-pack which usually helps. No fevers. Cough is worse when lying down.  Allergies  Allergen Reactions  . Influenza Vaccines   . Morphine Hives      Outpatient Medications Prior to Visit  Medication Sig Dispense Refill  . albuterol (PROAIR HFA) 108 (90 BASE) MCG/ACT inhaler Inhale 2 puffs into the lungs every 4 (four) hours as needed for wheezing or shortness of breath. 18 g 2  . albuterol (PROVENTIL) (2.5 MG/3ML) 0.083% nebulizer solution Take 2.5 mg by nebulization every 6 (six)  hours as needed for wheezing or shortness of breath.    . benzonatate (TESSALON) 100 MG capsule Take 1 capsule (100 mg total) by mouth 3 (three) times daily. 20 capsule 0  . blood glucose meter kit and supplies Dispense based on patient and insurance preference. Use up to four times daily as directed. (FOR ICD-9 250.00, 250.01). 1 each 0  . diphenhydrAMINE (BENADRYL) 25 mg capsule Take 1 capsule (25 mg total) by mouth every 6 (six) hours as needed. 30 capsule 0  . Dulaglutide (TRULICITY) 1.5 DG/3.8VF SOPN Inject 1.5 mg into the skin once a week. 4 pen 6  . fexofenadine (ALLEGRA) 180 MG tablet Take 180 mg by mouth daily as needed. For seasonal allergies    . fluticasone (FLONASE) 50 MCG/ACT nasal spray Place 2 sprays into both nostrils daily. 16 g 6  . Fluticasone-Salmeterol (ADVAIR DISKUS) 250-50 MCG/DOSE AEPB Inhale 1 puff into the lungs 2 (two) times daily. 1 each 3  . glipiZIDE (GLUCOTROL) 5 MG tablet TAKE 1 TABLET(5 MG) BY MOUTH TWICE DAILY BEFORE A MEAL 60 tablet 2  . glucose blood (ONE TOUCH ULTRA TEST) test strip Use as directed 100 each 2  . ibuprofen (ADVIL,MOTRIN) 800 MG tablet Take 1 tablet (800 mg total) by mouth every 8 (eight) hours as needed. 90 tablet 1  . losartan (COZAAR) 100 MG tablet TAKE 1 TABLET BY MOUTH DAILY 90 tablet 3  . mometasone (NASONEX) 50 MCG/ACT  nasal spray Place 2 sprays into the nose daily as needed. For nasal spray    . norethindrone (AYGESTIN) 5 MG tablet Take 1 by mouth daily    . olopatadine (PATANOL) 0.1 % ophthalmic solution Place 1 drop into both eyes 2 (two) times daily. 5 mL 12  . amoxicillin-clavulanate (AUGMENTIN) 875-125 MG tablet Take 1 tablet by mouth 2 (two) times daily. 20 tablet 0   No facility-administered medications prior to visit.       Past Surgical History:  Procedure Laterality Date  . ABDOMINAL HYSTERECTOMY  05/14/12   [place]  . LAPAROSCOPIC GASTRIC BANDING  10/02/2010   start weight 349#  . SALPINGOOPHORECTOMY   10/08/11   right      Past Medical History:  Diagnosis Date  . Allergic rhinitis, cause unspecified   . Asthma   . Diabetes mellitus type II 08/2009 dx  . Endometriosis   . Hypertension   . Knee pain, right    DJD, post traumatic with repeat falls  . Morbid obesity ([place])    s/p lab band 09/2010  . Ovarian cyst    right s/p resection June 2013      Review of Systems  Constitutional: Negative for chills and fever.  Respiratory: Negative for chest tightness and shortness of breath.   Cardiovascular: Negative for chest pain, palpitations and leg swelling.  Musculoskeletal: Positive for back pain.  Neurological: Negative for weakness and numbness.      Objective:    BP (!) 144/92 (BP Location: Left Arm, Patient Position: Sitting, Cuff Size: Large)   Pulse 70   Temp 99.1 F (37.3 C) (Oral)   Resp 18   Ht '5\' 8"'  (1.727 m)   Wt (!) 336 lb (152.4 kg)   LMP 07/28/2011   SpO2 98%   BMI 51.09 kg/m  Nursing note and vital signs reviewed.  Physical Exam  Constitutional: She is oriented to person, place, and time. She appears well-developed and well-nourished. No distress.  HENT:  Right Ear: Hearing, tympanic membrane, external ear and ear canal normal.  Left Ear: Hearing, tympanic membrane, external ear and ear canal normal.  Nose: Nose normal.  Mouth/Throat: Uvula is midline, oropharynx is clear and moist and mucous membranes are normal.  Neck: Neck supple.  Cardiovascular: Normal rate, regular rhythm, normal heart sounds and intact distal pulses.  Exam reveals no gallop and no friction rub.   No murmur heard. Pulmonary/Chest: Effort normal and breath sounds normal. No respiratory distress. She has no wheezes. She has no rales. She exhibits no tenderness.  Musculoskeletal:  Low back - No obvious deformity, discoloration, or edema. Tenderness of lumbar paraspinal musculature bilaterally. Range of motion within normal limits with some discomfort noted with lateral bending.  Distal pulses and sensation are intact and appropriate. Straight leg is negative.   Lymphadenopathy:    She has no cervical adenopathy.  Neurological: She is alert and oriented to person, place, and time.  Skin: Skin is warm and dry.  Psychiatric: She has a normal mood and affect. Her behavior is normal. Judgment and thought content normal.       Assessment & Plan:   Problem List Items Addressed This Visit      Other   Cough    Cough following recent upper respiratory / sinus infection with concern for GERD versus airway inflammation. Continue current dosage of albuterol as needed. Trial OTC omeprazole or lansoprazole. Follow up if symptoms worsen or do not improve.  Low back pain - Primary    New onset low back pain unlikely related to previous fall with symptoms consistent with paraspinal muscle imbalance. Treat conservatively with ice, moist heat and home exercise therapy. Continue previously prescribed ibuprofen. Obtain x-rays to check for acute process. Follow if symptoms worsen or do not improve.       Relevant Orders   DG Lumbar Spine Complete (Completed)       I have discontinued Ms. Muchmore's amoxicillin-clavulanate. I am also having her maintain her fexofenadine, mometasone, Fluticasone-Salmeterol, olopatadine, norethindrone, glucose blood, blood glucose meter kit and supplies, albuterol, albuterol, Dulaglutide, losartan, ibuprofen, glipiZIDE, diphenhydrAMINE, fluticasone, and benzonatate.   Follow-up: Return in about 1 month (around 11/17/2016), or if symptoms worsen or fail to improve.  Mauricio Po, FNP

## 2016-10-18 NOTE — Assessment & Plan Note (Signed)
Cough following recent upper respiratory / sinus infection with concern for GERD versus airway inflammation. Continue current dosage of albuterol as needed. Trial OTC omeprazole or lansoprazole. Follow up if symptoms worsen or do not improve.

## 2016-10-18 NOTE — Patient Instructions (Signed)
Thank you for choosing [facility].  SUMMARY AND INSTRUCTIONS:  Ice and moist heat x 20 minutes every 2 hours and as needed.  Stretches and exercises daily.  We will try an inhaler for the cough and also recommend omeprazole or lansoprazole over the counter.   Duexis - 1 tablet 3x per day for the next 3 days.   Medication:  Your prescription(s) have been submitted to your pharmacy or been printed and provided for you. Please take as directed and contact our office if you believe you are having problem(s) with the medication(s) or have any questions.  Imaging / Radiology:  Please stop by radiology on the basement level of the building for your x-rays. Your results will be released to MyChart (or called to you) after review, usually within 72 hours after test completion. If any treatments or changes are necessary, you will be notified at that same time.  Follow up:  If your symptoms worsen or fail to improve, please contact our office for further instruction, or in case of emergency go directly to the emergency room at the closest medical facility.     Low Back Strain Rehab Ask your health care provider which exercises are safe for you. Do exercises exactly as told by your health care provider and adjust them as directed. It is normal to feel mild stretching, pulling, tightness, or discomfort as you do these exercises, but you should stop right away if you feel sudden pain or your pain gets worse. Do not begin these exercises until told by your health care provider. Stretching and range of motion exercises These exercises warm up your muscles and joints and improve the movement and flexibility of your back. These exercises also help to relieve pain, numbness, and tingling. Exercise A: Single knee to chest  1. Lie on your back on a firm surface with both legs straight. 2. Bend one of your knees. Use your hands to move your knee up toward your chest until you feel a gentle stretch  in your lower back and buttock. ? Hold your leg in this position by holding onto the front of your knee. ? Keep your other leg as straight as possible. 3. Hold for __________ seconds. 4. Slowly return to the starting position. 5. Repeat with your other leg. Repeat __________ times. Complete this exercise __________ times a day. Exercise B: Prone extension on elbows  1. Lie on your abdomen on a firm surface. 2. Prop yourself up on your elbows. 3. Use your arms to help lift your chest up until you feel a gentle stretch in your abdomen and your lower back. ? This will place some of your body weight on your elbows. If this is uncomfortable, try stacking pillows under your chest. ? Your hips should stay down, against the surface that you are lying on. Keep your hip and back muscles relaxed. 4. Hold for __________ seconds. 5. Slowly relax your upper body and return to the starting position. Repeat __________ times. Complete this exercise __________ times a day. Strengthening exercises These exercises build strength and endurance in your back. Endurance is the ability to use your muscles for a long time, even after they get tired. Exercise C: Pelvic tilt 1. Lie on your back on a firm surface. Bend your knees and keep your feet flat. 2. Tense your abdominal muscles. Tip your pelvis up toward the ceiling and flatten your lower back into the floor. ? To help with this exercise, you may place a small  towel under your lower back and try to push your back into the towel. 3. Hold for __________ seconds. 4. Let your muscles relax completely before you repeat this exercise. Repeat __________ times. Complete this exercise __________ times a day. Exercise D: Alternating arm and leg raises  1. Get on your hands and knees on a firm surface. If you are on a hard floor, you may want to use padding to cushion your knees, such as an exercise mat. 2. Line up your arms and legs. Your hands should be below your  shoulders, and your knees should be below your hips. 3. Lift your left leg behind you. At the same time, raise your right arm and straighten it in front of you. ? Do not lift your leg higher than your hip. ? Do not lift your arm higher than your shoulder. ? Keep your abdominal and back muscles tight. ? Keep your hips facing the ground. ? Do not arch your back. ? Keep your balance carefully, and do not hold your breath. 4. Hold for __________ seconds. 5. Slowly return to the starting position and repeat with your right leg and your left arm. Repeat __________ times. Complete this exercise __________times a day. Exercise J: Single leg lower with bent knees 1. Lie on your back on a firm surface. 2. Tense your abdominal muscles and lift your feet off the floor, one foot at a time, so your knees and hips are bent in an "L" shape (at about 90 degrees). ? Your knees should be over your hips and your lower legs should be parallel to the floor. 3. Keeping your abdominal muscles tense and your knee bent, slowly lower one of your legs so your toe touches the ground. 4. Lift your leg back up to return to the starting position. ? Do not hold your breath. ? Do not let your back arch. Keep your back flat against the ground. 5. Repeat with your other leg. Repeat __________ times. Complete this exercise __________ times a day. Posture and body mechanics  Body mechanics refers to the movements and positions of your body while you do your daily activities. Posture is part of body mechanics. Good posture and healthy body mechanics can help to relieve stress in your body's tissues and joints. Good posture means that your spine is in its natural S-curve position (your spine is neutral), your shoulders are pulled back slightly, and your head is not tipped forward. The following are general guidelines for applying improved posture and body mechanics to your everyday activities. Standing   When standing, keep your  spine neutral and your feet about hip-width apart. Keep a slight bend in your knees. Your ears, shoulders, and hips should line up.  When you do a task in which you stand in one place for a long time, place one foot up on a stable object that is 2-4 inches (5-10 cm) high, such as a footstool. This helps keep your spine neutral. Sitting   When sitting, keep your spine neutral and keep your feet flat on the floor. Use a footrest, if necessary, and keep your thighs parallel to the floor. Avoid rounding your shoulders, and avoid tilting your head forward.  When working at a desk or a computer, keep your desk at a height where your hands are slightly lower than your elbows. Slide your chair under your desk so you are close enough to maintain good posture.  When working at a computer, place your monitor at a height  where you are looking straight ahead and you do not have to tilt your head forward or downward to look at the screen. Resting   When lying down and resting, avoid positions that are most painful for you.  If you have pain with activities such as sitting, bending, stooping, or squatting (flexion-based activities), lie in a position in which your body does not bend very much. For example, avoid curling up on your side with your arms and knees near your chest (fetal position).  If you have pain with activities such as standing for a long time or reaching with your arms (extension-based activities), lie with your spine in a neutral position and bend your knees slightly. Try the following positions: ? Lying on your side with a pillow between your knees. ? Lying on your back with a pillow under your knees. Lifting   When lifting objects, keep your feet at least shoulder-width apart and tighten your abdominal muscles.  Bend your knees and hips and keep your spine neutral. It is important to lift using the strength of your legs, not your back. Do not lock your knees straight out.  Always ask  for help to lift heavy or awkward objects. This information is not intended to replace advice given to you by your health care provider. Make sure you discuss any questions you have with your health care provider. Document Released: 04/08/2005 Document Revised: 12/14/2015 Document Reviewed: 01/18/2015 Elsevier Interactive Patient Education  2018 [employer].   Food Choices for Gastroesophageal Reflux Disease, Adult When you have gastroesophageal reflux disease (GERD), the foods you eat and your eating habits are very important. Choosing the right foods can help ease your discomfort. What guidelines do I need to follow?  Choose fruits, vegetables, whole grains, and low-fat dairy products.  Choose low-fat meat, fish, and poultry.  Limit fats such as oils, salad dressings, butter, nuts, and avocado.  Keep a food diary. This helps you identify foods that cause symptoms.  Avoid foods that cause symptoms. These may be different for everyone.  Eat small meals often instead of 3 large meals a day.  Eat your meals slowly, in a place where you are relaxed.  Limit fried foods.  Cook foods using methods other than frying.  Avoid drinking alcohol.  Avoid drinking large amounts of liquids with your meals.  Avoid bending over or lying down until 2-3 hours after eating. What foods are not recommended? These are some foods and drinks that may make your symptoms worse: Vegetables Tomatoes. Tomato juice. Tomato and spaghetti sauce. Chili peppers. Onion and garlic. Horseradish. Fruits Oranges, grapefruit, and lemon (fruit and juice). Meats High-fat meats, fish, and poultry. This includes hot dogs, ribs, ham, sausage, salami, and bacon. Dairy Whole milk and chocolate milk. Sour cream. Cream. Butter. Ice cream. Cream cheese. Drinks Coffee and tea. Bubbly (carbonated) drinks or energy drinks. Condiments Hot sauce. Barbecue sauce. Sweets/Desserts Chocolate and cocoa. Donuts. Peppermint and  spearmint. Fats and Oils High-fat foods. This includes [place] fries and potato chips. Other Vinegar. Strong spices. This includes black pepper, white pepper, red pepper, cayenne, curry powder, cloves, ginger, and chili powder. The items listed above may not be a complete list of foods and drinks to avoid. Contact your dietitian for more information. This information is not intended to replace advice given to you by your health care provider. Make sure you discuss any questions you have with your health care provider. Document Released: 10/08/2011 Document Revised: 09/14/2015 Document Reviewed: 02/10/2013 Elsevier Interactive  Patient Education  2017 [employer].

## 2016-10-20 ENCOUNTER — Encounter: Payer: Self-pay | Admitting: Family

## 2016-12-23 ENCOUNTER — Other Ambulatory Visit: Payer: Self-pay | Admitting: Internal Medicine

## 2017-02-18 ENCOUNTER — Other Ambulatory Visit: Payer: Self-pay | Admitting: Internal Medicine

## 2017-02-18 ENCOUNTER — Ambulatory Visit (INDEPENDENT_AMBULATORY_CARE_PROVIDER_SITE_OTHER): Payer: BC Managed Care – PPO | Admitting: Nurse Practitioner

## 2017-02-18 ENCOUNTER — Encounter: Payer: Self-pay | Admitting: Nurse Practitioner

## 2017-02-18 VITALS — BP 138/86 | HR 66 | Temp 98.3°F | Ht 68.0 in | Wt 330.0 lb

## 2017-02-18 DIAGNOSIS — J4521 Mild intermittent asthma with (acute) exacerbation: Secondary | ICD-10-CM

## 2017-02-18 DIAGNOSIS — J0140 Acute pansinusitis, unspecified: Secondary | ICD-10-CM | POA: Diagnosis not present

## 2017-02-18 MED ORDER — AMOXICILLIN-POT CLAVULANATE 875-125 MG PO TABS: 1.0000 | ORAL_TABLET | Freq: Two times a day (BID) | ORAL | 0 refills | Status: DC

## 2017-02-18 MED ORDER — SALINE SPRAY 0.65 % NA SOLN: 1.0000 | NASAL | 0 refills | Status: DC | PRN

## 2017-02-18 MED ORDER — ALBUTEROL SULFATE (2.5 MG/3ML) 0.083% IN NEBU
2.5000 mg | INHALATION_SOLUTION | Freq: Once | RESPIRATORY_TRACT | Status: AC
  Administered 2017-02-18: 2.5 mg via RESPIRATORY_TRACT

## 2017-02-18 MED ORDER — DM-GUAIFENESIN ER 30-600 MG PO TB12: 1.0000 | ORAL_TABLET | Freq: Two times a day (BID) | ORAL | 0 refills | Status: DC | PRN

## 2017-02-18 MED ORDER — HYDROCODONE-HOMATROPINE 5-1.5 MG/5ML PO SYRP: 5.0000 mL | ORAL_SOLUTION | Freq: Four times a day (QID) | ORAL | 0 refills | Status: DC | PRN

## 2017-02-18 MED ORDER — FLUCONAZOLE 150 MG PO TABS: 150.0000 mg | ORAL_TABLET | Freq: Once | ORAL | 0 refills | Status: AC

## 2017-02-18 MED ORDER — METHYLPREDNISOLONE ACETATE 40 MG/ML IJ SUSP
40.0000 mg | Freq: Once | INTRAMUSCULAR | Status: AC
  Administered 2017-02-18: 40 mg via INTRAMUSCULAR

## 2017-02-18 NOTE — Patient Instructions (Addendum)
Encourage adequate oral hydration.  Continue inhalers and flonase as prescribed.  Return to office if no improvement in 2weeks.

## 2017-02-18 NOTE — Progress Notes (Signed)
Subjective:  Patient ID: Debra [name], female    DOB: 04/24/1975  Age: 41 y.o. MRN: [id number]  CC: Cough (present for one week, worse at night. She has been using albuterol and flonase which has not helped. She sleept with her head elevated which did not help.)   Cough  This is a new problem. The current episode started 1 to 4 weeks ago. The problem has been gradually worsening. The problem occurs constantly. The cough is productive of purulent sputum. Associated symptoms include chills, headaches, myalgias, nasal congestion, postnasal drip, rhinorrhea, a sore throat, shortness of breath and wheezing. Pertinent negatives include no chest pain or fever. The symptoms are aggravated by lying down and cold air. She has tried a beta-agonist inhaler, steroid inhaler and OTC cough suppressant for the symptoms. The treatment provided no relief. Her past medical history is significant for asthma and environmental allergies.    Outpatient Medications Prior to Visit  Medication Sig Dispense Refill  . albuterol (PROAIR HFA) 108 (90 BASE) MCG/ACT inhaler Inhale 2 puffs into the lungs every 4 (four) hours as needed for wheezing or shortness of breath. 18 g 2  . albuterol (PROVENTIL) (2.5 MG/3ML) 0.083% nebulizer solution Take 2.5 mg by nebulization every 6 (six) hours as needed for wheezing or shortness of breath.    . blood glucose meter kit and supplies Dispense based on patient and insurance preference. Use up to four times daily as directed. (FOR ICD-9 250.00, 250.01). 1 each 0  . diphenhydrAMINE (BENADRYL) 25 mg capsule Take 1 capsule (25 mg total) by mouth every 6 (six) hours as needed. 30 capsule 0  . fexofenadine (ALLEGRA) 180 MG tablet Take 180 mg by mouth daily as needed. For seasonal allergies    . fluticasone (FLONASE) 50 MCG/ACT nasal spray Place 2 sprays into both nostrils daily. 16 g 6  . Fluticasone-Salmeterol (ADVAIR DISKUS) 250-50 MCG/DOSE AEPB Inhale 1 puff into the lungs 2 (two) times  daily. 1 each 3  . glipiZIDE (GLUCOTROL) 5 MG tablet TAKE 1 TABLET(5 MG) BY MOUTH TWICE DAILY BEFORE A MEAL 60 tablet 2  . glucose blood (ONE TOUCH ULTRA TEST) test strip Use as directed 100 each 2  . ibuprofen (ADVIL,MOTRIN) 800 MG tablet Take 1 tablet (800 mg total) by mouth every 8 (eight) hours as needed. 90 tablet 1  . losartan (COZAAR) 100 MG tablet TAKE 1 TABLET BY MOUTH DAILY 90 tablet 3  . mometasone (NASONEX) 50 MCG/ACT nasal spray Place 2 sprays into the nose daily as needed. For nasal spray    . norethindrone (AYGESTIN) 5 MG tablet Take 1 by mouth daily    . olopatadine (PATANOL) 0.1 % ophthalmic solution Place 1 drop into both eyes 2 (two) times daily. 5 mL 12  . TRULICITY 1.5 XK/5.5VZ SOPN INJECT 1.5 MG INTO THE SKIN ONCE A WEEK 2 mL 2  . benzonatate (TESSALON) 100 MG capsule Take 1 capsule (100 mg total) by mouth 3 (three) times daily. 20 capsule 0   No facility-administered medications prior to visit.     ROS See HPI  Objective:  BP 138/86 (BP Location: Right Arm, Patient Position: Sitting, Cuff Size: Large)   Pulse 66   Temp 98.3 F (36.8 C) (Oral)   Ht '5\' 8"'  (1.727 m)   Wt (!) 330 lb (149.7 kg)   LMP 07/28/2011   SpO2 100%   BMI 50.18 kg/m   BP Readings from Last 3 Encounters:  02/18/17 138/86  10/18/16 (!) 144/92  09/30/16 (!) 152/90    Wt Readings from Last 3 Encounters:  02/18/17 (!) 330 lb (149.7 kg)  10/18/16 (!) 336 lb (152.4 kg)  09/30/16 (!) 334 lb (151.5 kg)    Physical Exam  Constitutional: She is oriented to person, place, and time.  HENT:  Right Ear: External ear and ear canal normal. A middle ear effusion is present.  Left Ear: External ear and ear canal normal. A middle ear effusion is present.  Nose: Mucosal edema and rhinorrhea present. Right sinus exhibits maxillary sinus tenderness and frontal sinus tenderness. Left sinus exhibits maxillary sinus tenderness and frontal sinus tenderness.  Mouth/Throat: Uvula is midline. No trismus in  the jaw. Posterior oropharyngeal erythema present. No oropharyngeal exudate.  Eyes: No scleral icterus.  Neck: Normal range of motion. Neck supple.  Cardiovascular: Normal rate and normal heart sounds.   Pulmonary/Chest: Effort normal. She has wheezes.  Resolved wheezing after nebulizer treatment  Musculoskeletal: She exhibits no edema.  Lymphadenopathy:    She has no cervical adenopathy.  Neurological: She is alert and oriented to person, place, and time.  Vitals reviewed.   Lab Results  Component Value Date   WBC 12.6 (H) 04/20/2016   HGB 13.7 04/20/2016   HCT 41.6 04/20/2016   PLT 268 04/20/2016   GLUCOSE 251 (H) 04/20/2016   CHOL 159 10/11/2015   TRIG 57.0 10/11/2015   HDL 39.20 10/11/2015   LDLCALC 109 (H) 10/11/2015   ALT 20 04/20/2016   AST 16 04/20/2016   NA 136 04/20/2016   K 3.8 04/20/2016   CL 107 04/20/2016   CREATININE 0.91 04/20/2016   BUN 13 04/20/2016   CO2 22 04/20/2016   TSH 2.54 04/18/2015   HGBA1C 9.4 09/30/2016   MICROALBUR 1.0 04/18/2015    Dg Lumbar Spine Complete  Result Date: 10/18/2016 CLINICAL DATA:  Low back pain when sitting and standing up intermittently for 6 months, no known injury, history asthma, hypertension, type II diabetes mellitus EXAM: LUMBAR SPINE - COMPLETE 4+ VIEW COMPARISON:  CT abdomen and pelvis 08/08/2011 FINDINGS: Five non-rib-bearing lumbar vertebra. Vertebral body and disc space heights maintained. Transitional S1 segment of sacrum. No acute fracture, subluxation or bone destruction. No spondylolysis. SI joints preserved. Tubing from prior gastric banding traverses upper abdomen. IMPRESSION: No acute osseous abnormalities. Electronically Signed   By: Lavonia Dana M.D.   On: 10/18/2016 16:19    Assessment & Plan:   Debra [name] was seen today for cough.  Diagnoses and all orders for this visit:  Mild intermittent asthma with acute exacerbation -     albuterol (PROVENTIL) (2.5 MG/3ML) 0.083% nebulizer solution 2.5 mg; Take 3  mLs (2.5 mg total) by nebulization once. -     methylPREDNISolone acetate (DEPO-MEDROL) injection 40 mg; Inject 1 mL (40 mg total) into the muscle once. -     HYDROcodone-homatropine (HYCODAN) 5-1.5 MG/5ML syrup; Take 5 mLs by mouth every 6 (six) hours as needed for cough. -     dextromethorphan-guaiFENesin (MUCINEX DM) 30-600 MG 12hr tablet; Take 1 tablet by mouth 2 (two) times daily as needed for cough. -     sodium chloride (OCEAN) 0.65 % SOLN nasal spray; Place 1 spray into both nostrils as needed for congestion. -     amoxicillin-clavulanate (AUGMENTIN) 875-125 MG tablet; Take 1 tablet by mouth 2 (two) times daily.  Acute non-recurrent pansinusitis -     albuterol (PROVENTIL) (2.5 MG/3ML) 0.083% nebulizer solution 2.5 mg; Take 3 mLs (2.5 mg total) by nebulization once. -  methylPREDNISolone acetate (DEPO-MEDROL) injection 40 mg; Inject 1 mL (40 mg total) into the muscle once. -     HYDROcodone-homatropine (HYCODAN) 5-1.5 MG/5ML syrup; Take 5 mLs by mouth every 6 (six) hours as needed for cough. -     dextromethorphan-guaiFENesin (MUCINEX DM) 30-600 MG 12hr tablet; Take 1 tablet by mouth 2 (two) times daily as needed for cough. -     sodium chloride (OCEAN) 0.65 % SOLN nasal spray; Place 1 spray into both nostrils as needed for congestion. -     amoxicillin-clavulanate (AUGMENTIN) 875-125 MG tablet; Take 1 tablet by mouth 2 (two) times daily.  Other orders -     fluconazole (DIFLUCAN) 150 MG tablet; Take 1 tablet (150 mg total) by mouth once.   I have discontinued Ms. Brandner's benzonatate. I am also having her start on HYDROcodone-homatropine, dextromethorphan-guaiFENesin, sodium chloride, amoxicillin-clavulanate, and fluconazole. Additionally, I am having her maintain her fexofenadine, mometasone, Fluticasone-Salmeterol, olopatadine, norethindrone, glucose blood, blood glucose meter kit and supplies, albuterol, albuterol, losartan, ibuprofen, glipiZIDE, diphenhydrAMINE, fluticasone, and  TRULICITY. We administered albuterol. We will continue to administer methylPREDNISolone acetate.  Meds ordered this encounter  Medications  . albuterol (PROVENTIL) (2.5 MG/3ML) 0.083% nebulizer solution 2.5 mg  . methylPREDNISolone acetate (DEPO-MEDROL) injection 40 mg  . HYDROcodone-homatropine (HYCODAN) 5-1.5 MG/5ML syrup    Sig: Take 5 mLs by mouth every 6 (six) hours as needed for cough.    Dispense:  100 mL    Refill:  0    Order Specific Question:   Supervising Provider    Answer:   Cassandria Anger [[id number]]  . dextromethorphan-guaiFENesin (MUCINEX DM) 30-600 MG 12hr tablet    Sig: Take 1 tablet by mouth 2 (two) times daily as needed for cough.    Dispense:  14 tablet    Refill:  0    Order Specific Question:   Supervising Provider    Answer:   Cassandria Anger [[id number]]  . sodium chloride (OCEAN) 0.65 % SOLN nasal spray    Sig: Place 1 spray into both nostrils as needed for congestion.    Dispense:  15 mL    Refill:  0    Order Specific Question:   Supervising Provider    Answer:   Cassandria Anger [[id number]]  . amoxicillin-clavulanate (AUGMENTIN) 875-125 MG tablet    Sig: Take 1 tablet by mouth 2 (two) times daily.    Dispense:  14 tablet    Refill:  0    Order Specific Question:   Supervising Provider    Answer:   Cassandria Anger [[id number]]  . fluconazole (DIFLUCAN) 150 MG tablet    Sig: Take 1 tablet (150 mg total) by mouth once.    Dispense:  1 tablet    Refill:  0    Order Specific Question:   Supervising Provider    Answer:   Cassandria Anger [[id number]]    Follow-up: Return if symptoms worsen or fail to improve.  Wilfred Lacy, NP

## 2017-04-03 ENCOUNTER — Other Ambulatory Visit: Payer: Self-pay | Admitting: Internal Medicine

## 2017-04-18 ENCOUNTER — Ambulatory Visit: Payer: Self-pay | Admitting: *Deleted

## 2017-04-18 NOTE — Telephone Encounter (Signed)
Patient states she has been sick since Monday- she has sinus symptoms and is now experiencing a cough that is effecting her breathing. She has asthma and is using her inhalers more often. She has been using OTC treatment without improvement.  Reason for Disposition . [1] Using nasal washes and pain medicine > 24 hours AND [2] sinus pain (around cheekbone or eye) persists  Answer Assessment - Initial Assessment Questions 1. ONSET: "When did the cough begin?"      3 days 2. SEVERITY: "How bad is the cough today?"      Coughing with exertion- ta night it gets worse 3. RESPIRATORY DISTRESS: "Describe your breathing."      Patient feels out of breath- she is using her inhalers- doesn't feel they are helping a lot 4. FEVER: "Do you have a fever?" If so, ask: "What is your temperature, how was it measured, and when did it start?"     No fever 5. SPUTUM: "Describe the color of your sputum" (clear, white, yellow, green)     Thick white 6. HEMOPTYSIS: "Are you coughing up any blood?" If so ask: "How much?" (flecks, streaks, tablespoons, etc.)     No- but she does see blood with nasal congestion 7. CARDIAC HISTORY: "Do you have any history of heart disease?" (e.g., heart attack, congestive heart failure)      no 8. LUNG HISTORY: "Do you have any history of lung disease?"  (e.g., pulmonary embolus, asthma, emphysema)     asthma 9. PE RISK FACTORS: "Do you have a history of blood clots?" (or: recent major surgery, recent prolonged travel, bedridden )     no 10. OTHER SYMPTOMS: "Do you have any other symptoms?" (e.g., runny nose, wheezing, chest pain)       Nasal congestion, some wheezing, chest feels "itchy" on inside, sneezing 11. PREGNANCY: "Is there any chance you are pregnant?" "When was your last menstrual period?"       No- hysterectomy 12. TRAVEL: "Have you traveled out of the country in the last month?" (e.g., travel history, exposures)       no  Protocols used: COUGH - ACUTE  PRODUCTIVE-A-AH

## 2017-04-19 ENCOUNTER — Encounter: Payer: Self-pay | Admitting: Family Medicine

## 2017-04-19 ENCOUNTER — Ambulatory Visit: Payer: BC Managed Care – PPO | Admitting: Family Medicine

## 2017-04-19 VITALS — BP 142/84 | HR 70 | Temp 98.7°F | Wt 329.0 lb

## 2017-04-19 DIAGNOSIS — J0100 Acute maxillary sinusitis, unspecified: Secondary | ICD-10-CM

## 2017-04-19 MED ORDER — BENZONATATE 200 MG PO CAPS: 200.0000 mg | ORAL_CAPSULE | Freq: Three times a day (TID) | ORAL | 1 refills | Status: DC | PRN

## 2017-04-19 MED ORDER — FLUCONAZOLE 150 MG PO TABS: 150.0000 mg | ORAL_TABLET | Freq: Once | ORAL | 0 refills | Status: AC

## 2017-04-19 MED ORDER — AMOXICILLIN-POT CLAVULANATE 875-125 MG PO TABS: 1.0000 | ORAL_TABLET | Freq: Two times a day (BID) | ORAL | 0 refills | Status: DC

## 2017-04-19 NOTE — Progress Notes (Signed)
Subjective:    Patient ID: Debra [name], female    DOB: 06-11-1975, 41 y.o.   MRN: [id number]  HPI 41 yo female with hx of asthma here for uri symptoms   Started getting sick on Monday  St/itchy throat  Then sticky irritated eyes  Nasal congestion - d/c with blood  Sinus pain and feels like eyes /face are full  Hoarse  Cough - prod / phlegm is thick /pale   Wheezing is fairly controlled   No fever   She takes her regular allergy med -allegra and ns flonase  No other otc medicines   Patient Active Problem List   Diagnosis Date Noted  . Acute maxillary sinusitis 04/19/2017  . Low back pain 10/18/2016  . Abscess of chest wall 06/21/2016  . Left shoulder pain 05/21/2016  . Routine general medical examination at a health care facility 10/13/2015  . Endometriosis 11/03/2013  . Right knee pain   . Hypertension 08/26/2011  . Hx of laparoscopic gastric banding, 10/02/2010. 11/27/2010  . Cough 02/23/2010  . Diabetes mellitus type 2, uncontrolled ([place]) 08/23/2009  . Morbid obesity ([place]) 08/22/2009  . Allergic rhinitis 08/22/2009  . Asthma 08/22/2009  . ARTHRITIS 08/22/2009   Past Medical History:  Diagnosis Date  . Allergic rhinitis, cause unspecified   . Asthma   . Diabetes mellitus type II 08/2009 dx  . Endometriosis   . Hypertension   . Knee pain, right    DJD, post traumatic with repeat falls  . Morbid obesity ([place])    s/p lab band 09/2010  . Ovarian cyst    right s/p resection June 2013   Past Surgical History:  Procedure Laterality Date  . ABDOMINAL HYSTERECTOMY  05/14/12   [facility]  . LAPAROSCOPIC GASTRIC BANDING  10/02/2010   start weight 349#  . SALPINGOOPHORECTOMY  10/08/11   right   Social History   Tobacco Use  . Smoking status: Never Smoker  . Smokeless tobacco: Never Used  Substance Use Topics  . Alcohol use: No    Alcohol/week: 0.0 oz  . Drug use: No   Family History  Problem Relation Age of Onset  . Heart disease Mother   .  Hypertension Mother   . Diabetes Mother   . Stroke Father 32  . Diabetes Father    Allergies  Allergen Reactions  . Influenza Vaccines   . Morphine Hives   Current Outpatient Medications on File Prior to Visit  Medication Sig Dispense Refill  . albuterol (PROAIR HFA) 108 (90 BASE) MCG/ACT inhaler Inhale 2 puffs into the lungs every 4 (four) hours as needed for wheezing or shortness of breath. 18 g 2  . albuterol (PROVENTIL) (2.5 MG/3ML) 0.083% nebulizer solution Take 2.5 mg by nebulization every 6 (six) hours as needed for wheezing or shortness of breath.    [name] amoxicillin-clavulanate (AUGMENTIN) 875-125 MG tablet Take 1 tablet by mouth 2 (two) times daily. 14 tablet 0  . blood glucose meter kit and supplies Dispense based on patient and insurance preference. Use up to four times daily as directed. (FOR ICD-9 250.00, 250.01). 1 each 0  . dextromethorphan-guaiFENesin (MUCINEX DM) 30-600 MG 12hr tablet Take 1 tablet by mouth 2 (two) times daily as needed for cough. 14 tablet 0  . diphenhydrAMINE (BENADRYL) 25 mg capsule Take 1 capsule (25 mg total) by mouth every 6 (six) hours as needed. 30 capsule 0  . fexofenadine (ALLEGRA) 180 MG tablet Take 180 mg by mouth daily as needed.  For seasonal allergies    . fluticasone (FLONASE) 50 MCG/ACT nasal spray Place 2 sprays into both nostrils daily. 16 g 6  . Fluticasone-Salmeterol (ADVAIR DISKUS) 250-50 MCG/DOSE AEPB Inhale 1 puff into the lungs 2 (two) times daily. 1 each 3  . glipiZIDE (GLUCOTROL) 5 MG tablet Take 1 tablet (5 mg total) by mouth 2 (two) times daily before a meal. NEED ANNUAL VISIT FOR FURTHER REFILLS 60 tablet 0  . glucose blood (ONE TOUCH ULTRA TEST) test strip Use as directed 100 each 2  . HYDROcodone-homatropine (HYCODAN) 5-1.5 MG/5ML syrup Take 5 mLs by mouth every 6 (six) hours as needed for cough. 100 mL 0  . ibuprofen (ADVIL,MOTRIN) 800 MG tablet Take 1 tablet (800 mg total) by mouth every 8 (eight) hours as needed. 90 tablet 1    . losartan (COZAAR) 100 MG tablet TAKE 1 TABLET BY MOUTH DAILY 90 tablet 0  . mometasone (NASONEX) 50 MCG/ACT nasal spray Place 2 sprays into the nose daily as needed. For nasal spray    . norethindrone (AYGESTIN) 5 MG tablet Take 1 by mouth daily    . olopatadine (PATANOL) 0.1 % ophthalmic solution Place 1 drop into both eyes 2 (two) times daily. 5 mL 12  . sodium chloride (OCEAN) 0.65 % SOLN nasal spray Place 1 spray into both nostrils as needed for congestion. 15 mL 0  . TRULICITY 1.5 BJ/6.2GB SOPN INJECT 1.5 MG INTO THE SKIN ONCE A WEEK 2 mL 2   No current facility-administered medications on file prior to visit.      Review of Systems  Constitutional: Positive for appetite change. Negative for fatigue and fever.  HENT: Positive for congestion, ear pain, postnasal drip, rhinorrhea, sinus pressure and sore throat. Negative for nosebleeds.   Eyes: Negative for pain, redness and itching.  Respiratory: Positive for cough. Negative for shortness of breath and wheezing.   Cardiovascular: Negative for chest pain.  Gastrointestinal: Negative for abdominal pain, diarrhea, nausea and vomiting.  Endocrine: Negative for polyuria.  Genitourinary: Negative for dysuria, frequency and urgency.  Musculoskeletal: Negative for arthralgias and myalgias.  Allergic/Immunologic: Negative for immunocompromised state.  Neurological: Positive for headaches. Negative for dizziness, tremors, syncope, weakness and numbness.  Hematological: Negative for adenopathy. Does not bruise/bleed easily.  Psychiatric/Behavioral: Negative for dysphoric mood. The patient is not nervous/anxious.        Objective:   Physical Exam  Constitutional: She appears well-developed and well-nourished. No distress.  obese and well appearing   HENT:  Head: Normocephalic and atraumatic.  Right Ear: External ear normal.  Left Ear: External ear normal.  Mouth/Throat: Oropharynx is clear and moist. No oropharyngeal exudate.  Nares  are injected and congested  Bilateral maxillaryand frontal sinus tenderness  Post nasal drip   Eyes: Conjunctivae and EOM are normal. Pupils are equal, round, and reactive to light. Right eye exhibits no discharge. Left eye exhibits no discharge.  Neck: Normal range of motion. Neck supple.  Cardiovascular: Normal rate and regular rhythm.  Pulmonary/Chest: Effort normal and breath sounds normal. No respiratory distress. She has no wheezes. She has no rales.  Good air exch with no wheezing   Lymphadenopathy:    She has no cervical adenopathy.  Neurological: She is alert. No cranial nerve deficit.  Skin: Skin is warm and dry. No rash noted. No pallor.  Psychiatric: She has a normal mood and affect.          Assessment & Plan:   Problem List Items Addressed This Visit  Respiratory   Acute maxillary sinusitis    Cover with augmentin Tessalon for cough  Disc symptomatic care - see instructions on AVS  Update if not starting to improve in a week or if worsening  -esp if asthma acts up  Continue current asthma/all medications      Relevant Medications   amoxicillin-clavulanate (AUGMENTIN) 875-125 MG tablet   benzonatate (TESSALON) 200 MG capsule

## 2017-04-19 NOTE — Patient Instructions (Signed)
Drink fluids and rest  Take the augmentin for sinus infection  Continue asthma and allergy medicines  Try tessalon for cough   Update if not starting to improve in a week or if worsening  -- esp if asthma worsens

## 2017-04-20 NOTE — Assessment & Plan Note (Signed)
Cover with augmentin Tessalon for cough  Disc symptomatic care - see instructions on AVS  Update if not starting to improve in a week or if worsening  -esp if asthma acts up  Continue current asthma/all medications

## 2017-04-21 ENCOUNTER — Ambulatory Visit: Payer: Self-pay

## 2017-04-21 NOTE — Telephone Encounter (Signed)
Pt. called to report possible reaction to Augmentin that was started on Saturday.  C/o itching on back of the hands; described red patchy areas that she can feel bumps, but it appears non-raised.  Described the itching as moderate.  Stated there are no other areas of itching or rash noted on body.  Reported taking 2 doses of Augmentin on Saturday, and 2 doses on Sunday.  Has not taken any Augmentin today.  Questioned about difficulty breathing, tongue swelling, feeling of airway closing; pt. Stated she has the same symptoms of nasal congestion, irritation in throat, and cough, that are unchanged from Saturday; denies worsening.  Phone call to Dr. Juleen China, on call MD.  Reported pt's symptoms of itching and red patchy areas on back of hands, and of recent recommendation to take Augmentin and Tessalon.  Per Dr. Juleen China, since the pt. Has taken 2 days of dosing, have her stop the Augmentin, and start Benadryl to treat the itching.  Advised she will not recommend another antibiotic at this time, but to make PCP aware.    Ret'd call to pt.  Advised of Dr. Alcario Drought recommendations.  Encouraged to call 911 or go to the ER, if develops any problems with breathing/ airway.  Pt. Verb. Understanding.  Will send note to PCP.          Reason for Disposition . [1] MODERATE-SEVERE local itching (i.e., interferes with work, school, activities) AND [2] not improved after 24 hours of hydrocortisone cream  Answer Assessment - Initial Assessment Questions 1. DESCRIPTION: "Describe the itching you are having." "Where is it located?"     Itching on back of hands; red clusters on hands; can feel bumps but nonraised.  2. SEVERITY: "How bad is it?"    - MILD - doesn't interfere with normal activities   - MODERATE-SEVERE: interferes with work, school, sleep, or other activities      Moderate 3. SCRATCHING: "Are there any scratch marks? Bleeding?"     No break in the skin 4. ONSET: "When did the itching begin?"   Saturday 5. CAUSE: "What do you think is causing the itching?"      Possible antibiotic 6. OTHER SYMPTOMS: "Do you have any other symptoms?"      URI symptoms continued but not worse 7. PREGNANCY: "Is there any chance you are pregnant?" "When was your last menstrual period?"    No; hx hysterectomy  Protocols used: ITCHING - LOCALIZED-A-AH

## 2017-04-22 DIAGNOSIS — I639 Cerebral infarction, unspecified: Secondary | ICD-10-CM

## 2017-04-22 HISTORY — DX: Cerebral infarction, unspecified: I63.9

## 2017-04-23 NOTE — Telephone Encounter (Signed)
Left message asking patient to call [facility] back----is it possible she can be seen by one of our providers today to make sure this is reaction to med---I have openings with laura murray,NP at the moment---can talk with Derrian Rodak,RN at [facility] if needed

## 2017-04-24 ENCOUNTER — Ambulatory Visit: Payer: BC Managed Care – PPO | Admitting: Internal Medicine

## 2017-04-24 ENCOUNTER — Encounter: Payer: Self-pay | Admitting: Internal Medicine

## 2017-04-24 VITALS — BP 134/86 | Temp 98.3°F | Ht 68.0 in | Wt 327.0 lb

## 2017-04-24 DIAGNOSIS — Z889 Allergy status to unspecified drugs, medicaments and biological substances status: Secondary | ICD-10-CM | POA: Insufficient documentation

## 2017-04-24 DIAGNOSIS — J4521 Mild intermittent asthma with (acute) exacerbation: Secondary | ICD-10-CM

## 2017-04-24 DIAGNOSIS — E1165 Type 2 diabetes mellitus with hyperglycemia: Secondary | ICD-10-CM

## 2017-04-24 DIAGNOSIS — J0100 Acute maxillary sinusitis, unspecified: Secondary | ICD-10-CM | POA: Diagnosis not present

## 2017-04-24 LAB — POCT GLYCOSYLATED HEMOGLOBIN (HGB A1C): HEMOGLOBIN A1C: 9.7

## 2017-04-24 MED ORDER — DOXYCYCLINE HYCLATE 100 MG PO TABS: 100.0000 mg | ORAL_TABLET | Freq: Two times a day (BID) | ORAL | 0 refills | Status: DC

## 2017-04-24 MED ORDER — EMPAGLIFLOZIN 25 MG PO TABS: 25.0000 mg | ORAL_TABLET | Freq: Every day | ORAL | 3 refills | Status: DC

## 2017-04-24 NOTE — Patient Instructions (Addendum)
Your A1c was elevated today over 9  Please take all new medication as prescribed - the new antibiotic, but also the Jardiance  If the [place] PepperJardiance is not covered with your insurance, be sure to ask if another in the same class is covered and let us know  Please do not take any Penicillin type medications in the future  Please continue all other medications as before, and refills have been done if requested.  Please have the pharmacy call with any other refills you may need.  Please continue your efforts at being more active, low cholesterol diabetic diet, and weight control.  Please keep your appointments with your specialists as you may have planned  Please return in 3 months, or sooner if needed, with Lab testing done 3-5 days before, to Dr Okey Duprerawford

## 2017-04-24 NOTE — Progress Notes (Signed)
Subjective:    Patient ID: Debra [name], female    DOB: 1975/06/18, 42 y.o.   MRN: [id number]  HPI  Here with 2-3 days acute onset fever, facial pain, pressure, headache, general weakness and malaise, and greenish d/c, with mild ST and cough, but pt denies chest pain, wheezing, increased sob or doe, orthopnea, PND, increased LE swelling, palpitations, dizziness or syncope  Also was moving last wk, had severe allergic rhinits like falre with all the dust, was sen on sat clinic, tx with antibx and cough pills.  Has new rash to both post hands symetrical with itching - ? Hives in the making? But now resolved this am.   Pt denies polydipsia, polyuria. Past Medical History:  Diagnosis Date  . Allergic rhinitis, cause unspecified   . Asthma   . Diabetes mellitus type II 08/2009 dx  . Endometriosis   . Hypertension   . Knee pain, right    DJD, post traumatic with repeat falls  . Morbid obesity ([place])    s/p lab band 09/2010  . Ovarian cyst    right s/p resection June 2013   Past Surgical History:  Procedure Laterality Date  . ABDOMINAL HYSTERECTOMY  05/14/12   [facility] [place]  . LAPAROSCOPIC GASTRIC BANDING  10/02/2010   start weight 349#  . SALPINGOOPHORECTOMY  10/08/11   right    reports that  has never smoked. she has never used smokeless tobacco. She reports that she does not drink alcohol or use drugs. family history includes Diabetes in her father and mother; Heart disease in her mother; Hypertension in her mother; Stroke (age of onset: 64) in her father. Allergies  Allergen Reactions  . Influenza Vaccines   . Metformin And Related Diarrhea  . Morphine Hives  . Augmentin [Amoxicillin-Pot Clavulanate] Rash    To bilat post hands only   Current Outpatient Medications on File Prior to Visit  Medication Sig Dispense Refill  . albuterol (PROAIR HFA) 108 (90 BASE) MCG/ACT inhaler Inhale 2 puffs into the lungs every 4 (four) hours as needed for wheezing or shortness of breath. 18  g 2  . albuterol (PROVENTIL) (2.5 MG/3ML) 0.083% nebulizer solution Take 2.5 mg by nebulization every 6 (six) hours as needed for wheezing or shortness of breath.    . benzonatate (TESSALON) 200 MG capsule Take 1 capsule (200 mg total) by mouth 3 (three) times daily as needed. Swallow whole-do not bite pill 30 capsule 1  . blood glucose meter kit and supplies Dispense based on patient and insurance preference. Use up to four times daily as directed. (FOR ICD-9 250.00, 250.01). 1 each 0  . dextromethorphan-guaiFENesin (MUCINEX DM) 30-600 MG 12hr tablet Take 1 tablet by mouth 2 (two) times daily as needed for cough. 14 tablet 0  . diphenhydrAMINE (BENADRYL) 25 mg capsule Take 1 capsule (25 mg total) by mouth every 6 (six) hours as needed. 30 capsule 0  . fexofenadine (ALLEGRA) 180 MG tablet Take 180 mg by mouth daily as needed. For seasonal allergies    . fluticasone (FLONASE) 50 MCG/ACT nasal spray Place 2 sprays into both nostrils daily. 16 g 6  . Fluticasone-Salmeterol (ADVAIR DISKUS) 250-50 MCG/DOSE AEPB Inhale 1 puff into the lungs 2 (two) times daily. 1 each 3  . glipiZIDE (GLUCOTROL) 5 MG tablet Take 1 tablet (5 mg total) by mouth 2 (two) times daily before a meal. NEED ANNUAL VISIT FOR FURTHER REFILLS 60 tablet 0  . glucose blood (ONE TOUCH ULTRA TEST) test  strip Use as directed 100 each 2  . HYDROcodone-homatropine (HYCODAN) 5-1.5 MG/5ML syrup Take 5 mLs by mouth every 6 (six) hours as needed for cough. 100 mL 0  . ibuprofen (ADVIL,MOTRIN) 800 MG tablet Take 1 tablet (800 mg total) by mouth every 8 (eight) hours as needed. 90 tablet 1  . losartan (COZAAR) 100 MG tablet TAKE 1 TABLET BY MOUTH DAILY 90 tablet 0  . mometasone (NASONEX) 50 MCG/ACT nasal spray Place 2 sprays into the nose daily as needed. For nasal spray    . norethindrone (AYGESTIN) 5 MG tablet Take 1 by mouth daily    . olopatadine (PATANOL) 0.1 % ophthalmic solution Place 1 drop into both eyes 2 (two) times daily. 5 mL 12  .  sodium chloride (OCEAN) 0.65 % SOLN nasal spray Place 1 spray into both nostrils as needed for congestion. 15 mL 0  . TRULICITY 1.5 MP/5.3IR SOPN INJECT 1.5 MG INTO THE SKIN ONCE A WEEK 2 mL 2   No current facility-administered medications on file prior to visit.    Review of Systems  Constitutional: Negative for other unusual diaphoresis or sweats HENT: Negative for ear discharge or swelling Eyes: Negative for other worsening visual disturbances Respiratory: Negative for stridor or other swelling  Gastrointestinal: Negative for worsening distension or other blood Genitourinary: Negative for retention or other urinary change Musculoskeletal: Negative for other MSK pain or swelling Skin: Negative for color change or other new lesions Neurological: Negative for worsening tremors and other numbness  Psychiatric/Behavioral: Negative for worsening agitation or other fatigue All other system neg per pt    Objective:   Physical Exam BP 134/86   Temp 98.3 F (36.8 C) (Oral)   Ht '5\' 8"'  (1.727 m)   Wt (!) 327 lb (148.3 kg)   LMP 07/28/2011   BMI 49.72 kg/m  VS noted, mild ill Constitutional: Pt appears in NAD HENT: Head: NCAT.  Right Ear: External ear normal.  Left Ear: External ear normal.  Eyes: . Pupils are equal, round, and reactive to light. Conjunctivae and EOM are normal Bilat tm's with mild erythema.  Max sinus areas mild tender.  Pharynx with mild erythema, no exudate Nose: without d/c or deformity Neck: Neck supple. Gross normal ROM Cardiovascular: Normal rate and regular rhythm.   Pulmonary/Chest: Effort normal and breath sounds without rales or wheezing.  Neurological: Pt is alert. At baseline orientation, motor grossly intact Skin: Skin is warm. No rashes, other new lesions, no LE edema Psychiatric: Pt behavior is normal without agitation  No other exam findings  POCT glycosylated hemoglobin (Hb A1C)  Order: [id number]  Status:  Final result Visible to patient:  No  (Not Released) Dx:  Uncontrolled type 2 diabetes mellitus...  Component 11:48  Hemoglobin A1C 9.7            Assessment & Plan:

## 2017-04-24 NOTE — Assessment & Plan Note (Signed)
O/w stable overall by history and exam, recent data reviewed with pt, and pt to continue medical treatment as before,  to f/u any worsening symptoms or concerns 

## 2017-04-24 NOTE — Assessment & Plan Note (Signed)
Mild to mod, for antibx course,  to f/u any worsening symptoms or concerns 

## 2017-04-24 NOTE — Assessment & Plan Note (Signed)
With hand rash per pt now resolved, no further PCN related meds - added to allergy list

## 2017-04-24 NOTE — Assessment & Plan Note (Signed)
Uncontrolled, for add jardiance 25 qd, cont diet, wt control and other tx,  to f/u any worsening symptoms or concerns

## 2017-05-01 ENCOUNTER — Encounter (HOSPITAL_COMMUNITY): Payer: Self-pay

## 2017-05-07 ENCOUNTER — Other Ambulatory Visit: Payer: Self-pay | Admitting: Obstetrics and Gynecology

## 2017-05-07 DIAGNOSIS — Z1231 Encounter for screening mammogram for malignant neoplasm of breast: Secondary | ICD-10-CM

## 2017-05-15 ENCOUNTER — Other Ambulatory Visit: Payer: Self-pay | Admitting: Internal Medicine

## 2017-05-31 ENCOUNTER — Other Ambulatory Visit: Payer: Self-pay | Admitting: Internal Medicine

## 2017-06-10 ENCOUNTER — Ambulatory Visit
Admission: RE | Admit: 2017-06-10 | Discharge: 2017-06-10 | Disposition: A | Discharge status: Home / Self Care | Payer: BC Managed Care – PPO | Source: Ambulatory Visit | Attending: Obstetrics and Gynecology | Admitting: Obstetrics and Gynecology

## 2017-06-10 DIAGNOSIS — Z1231 Encounter for screening mammogram for malignant neoplasm of breast: Secondary | ICD-10-CM

## 2017-06-13 ENCOUNTER — Other Ambulatory Visit: Payer: Self-pay | Admitting: Obstetrics and Gynecology

## 2017-06-13 DIAGNOSIS — R928 Other abnormal and inconclusive findings on diagnostic imaging of breast: Secondary | ICD-10-CM

## 2017-06-17 ENCOUNTER — Other Ambulatory Visit: Payer: Self-pay | Admitting: Obstetrics and Gynecology

## 2017-06-17 ENCOUNTER — Ambulatory Visit
Admission: RE | Admit: 2017-06-17 | Discharge: 2017-06-17 | Disposition: A | Discharge status: Home / Self Care | Payer: BC Managed Care – PPO | Source: Ambulatory Visit | Attending: Obstetrics and Gynecology | Admitting: Obstetrics and Gynecology

## 2017-06-17 DIAGNOSIS — R928 Other abnormal and inconclusive findings on diagnostic imaging of breast: Secondary | ICD-10-CM

## 2017-06-17 DIAGNOSIS — N6489 Other specified disorders of breast: Secondary | ICD-10-CM

## 2017-06-30 ENCOUNTER — Emergency Department (HOSPITAL_COMMUNITY): Payer: BC Managed Care – PPO

## 2017-06-30 ENCOUNTER — Encounter (HOSPITAL_COMMUNITY): Payer: Self-pay

## 2017-06-30 ENCOUNTER — Inpatient Hospital Stay (HOSPITAL_COMMUNITY)
Admission: EM | Admit: 2017-06-30 | Discharge: 2017-07-09 | DRG: 065 | Disposition: A | Discharge status: Home / Self Care | Payer: BC Managed Care – PPO | Attending: Neurology | Admitting: Neurology

## 2017-06-30 DIAGNOSIS — G459 Transient cerebral ischemic attack, unspecified: Secondary | ICD-10-CM

## 2017-06-30 DIAGNOSIS — R402362 Coma scale, best motor response, obeys commands, at arrival to emergency department: Secondary | ICD-10-CM | POA: Diagnosis present

## 2017-06-30 DIAGNOSIS — Z7984 Long term (current) use of oral hypoglycemic drugs: Secondary | ICD-10-CM

## 2017-06-30 DIAGNOSIS — R402142 Coma scale, eyes open, spontaneous, at arrival to emergency department: Secondary | ICD-10-CM | POA: Diagnosis present

## 2017-06-30 DIAGNOSIS — E785 Hyperlipidemia, unspecified: Secondary | ICD-10-CM | POA: Diagnosis present

## 2017-06-30 DIAGNOSIS — Z6841 Body Mass Index (BMI) 40.0 and over, adult: Secondary | ICD-10-CM

## 2017-06-30 DIAGNOSIS — I6603 Occlusion and stenosis of bilateral middle cerebral arteries: Secondary | ICD-10-CM | POA: Diagnosis present

## 2017-06-30 DIAGNOSIS — E876 Hypokalemia: Secondary | ICD-10-CM | POA: Diagnosis not present

## 2017-06-30 DIAGNOSIS — I160 Hypertensive urgency: Secondary | ICD-10-CM | POA: Diagnosis present

## 2017-06-30 DIAGNOSIS — I63412 Cerebral infarction due to embolism of left middle cerebral artery: Principal | ICD-10-CM | POA: Diagnosis present

## 2017-06-30 DIAGNOSIS — I10 Essential (primary) hypertension: Secondary | ICD-10-CM

## 2017-06-30 DIAGNOSIS — I674 Hypertensive encephalopathy: Secondary | ICD-10-CM | POA: Diagnosis present

## 2017-06-30 DIAGNOSIS — R471 Dysarthria and anarthria: Secondary | ICD-10-CM | POA: Diagnosis present

## 2017-06-30 DIAGNOSIS — Z79899 Other long term (current) drug therapy: Secondary | ICD-10-CM

## 2017-06-30 DIAGNOSIS — J45909 Unspecified asthma, uncomplicated: Secondary | ICD-10-CM | POA: Diagnosis present

## 2017-06-30 DIAGNOSIS — Z9884 Bariatric surgery status: Secondary | ICD-10-CM

## 2017-06-30 DIAGNOSIS — R519 Headache, unspecified: Secondary | ICD-10-CM

## 2017-06-30 DIAGNOSIS — R402252 Coma scale, best verbal response, oriented, at arrival to emergency department: Secondary | ICD-10-CM | POA: Diagnosis present

## 2017-06-30 DIAGNOSIS — Z8673 Personal history of transient ischemic attack (TIA), and cerebral infarction without residual deficits: Secondary | ICD-10-CM | POA: Diagnosis present

## 2017-06-30 DIAGNOSIS — E1165 Type 2 diabetes mellitus with hyperglycemia: Secondary | ICD-10-CM | POA: Diagnosis present

## 2017-06-30 DIAGNOSIS — I9589 Other hypotension: Secondary | ICD-10-CM | POA: Diagnosis not present

## 2017-06-30 DIAGNOSIS — R51 Headache: Secondary | ICD-10-CM

## 2017-06-30 DIAGNOSIS — R29810 Facial weakness: Secondary | ICD-10-CM | POA: Diagnosis present

## 2017-06-30 DIAGNOSIS — E118 Type 2 diabetes mellitus with unspecified complications: Secondary | ICD-10-CM | POA: Diagnosis present

## 2017-06-30 DIAGNOSIS — Z888 Allergy status to other drugs, medicaments and biological substances status: Secondary | ICD-10-CM

## 2017-06-30 DIAGNOSIS — Z885 Allergy status to narcotic agent status: Secondary | ICD-10-CM

## 2017-06-30 DIAGNOSIS — I161 Hypertensive emergency: Secondary | ICD-10-CM | POA: Diagnosis present

## 2017-06-30 DIAGNOSIS — R4701 Aphasia: Secondary | ICD-10-CM | POA: Diagnosis present

## 2017-06-30 DIAGNOSIS — E1169 Type 2 diabetes mellitus with other specified complication: Secondary | ICD-10-CM

## 2017-06-30 DIAGNOSIS — Z887 Allergy status to serum and vaccine status: Secondary | ICD-10-CM

## 2017-06-30 DIAGNOSIS — Z794 Long term (current) use of insulin: Secondary | ICD-10-CM

## 2017-06-30 DIAGNOSIS — R29700 NIHSS score 0: Secondary | ICD-10-CM | POA: Diagnosis present

## 2017-06-30 DIAGNOSIS — I639 Cerebral infarction, unspecified: Secondary | ICD-10-CM

## 2017-06-30 LAB — COMPREHENSIVE METABOLIC PANEL
ALK PHOS: 60 U/L (ref 38–126)
ALT: 18 U/L (ref 14–54)
AST: 16 U/L (ref 15–41)
Albumin: 3.6 g/dL (ref 3.5–5.0)
Anion gap: 8 (ref 5–15)
BUN: 6 mg/dL (ref 6–20)
CALCIUM: 8.7 mg/dL — AB (ref 8.9–10.3)
CO2: 25 mmol/L (ref 22–32)
Chloride: 107 mmol/L (ref 101–111)
Creatinine, Ser: 0.91 mg/dL (ref 0.44–1.00)
GFR calc non Af Amer: >60 mL/min (ref >60)
GLUCOSE: 206 mg/dL — AB (ref 65–99)
Potassium: 3.5 mmol/L (ref 3.5–5.1)
SODIUM: 140 mmol/L (ref 135–145)
Total Bilirubin: 0.4 mg/dL (ref 0.3–1.2)
Total Protein: 7.1 g/dL (ref 6.5–8.1)

## 2017-06-30 LAB — DIFFERENTIAL
Basophils Absolute: 0 10*3/uL (ref 0.0–0.1)
Basophils Relative: 0 %
Eosinophils Absolute: 0.2 10*3/uL (ref 0.0–0.7)
Eosinophils Relative: 2 %
Lymphocytes Relative: 26 %
Lymphs Abs: 2.4 10*3/uL (ref 0.7–4.0)
MONO ABS: 0.4 10*3/uL (ref 0.1–1.0)
Monocytes Relative: 4 %
NEUTROS ABS: 6.2 10*3/uL (ref 1.7–7.7)
Neutrophils Relative %: 68 %

## 2017-06-30 LAB — I-STAT CHEM 8, ED
BUN: 4 mg/dL — AB (ref 6–20)
CALCIUM ION: 1.11 mmol/L — AB (ref 1.15–1.40)
CHLORIDE: 106 mmol/L (ref 101–111)
Creatinine, Ser: 0.9 mg/dL (ref 0.44–1.00)
Glucose, Bld: 208 mg/dL — ABNORMAL HIGH (ref 65–99)
HCT: 46 % (ref 36.0–46.0)
Hemoglobin: 15.6 g/dL — ABNORMAL HIGH (ref 12.0–15.0)
Potassium: 3.5 mmol/L (ref 3.5–5.1)
Sodium: 142 mmol/L (ref 135–145)
TCO2: 24 mmol/L (ref 22–32)

## 2017-06-30 LAB — CBC
HEMATOCRIT: 44.4 % (ref 36.0–46.0)
Hemoglobin: 14.3 g/dL (ref 12.0–15.0)
MCH: 27.2 pg (ref 26.0–34.0)
MCHC: 32.2 g/dL (ref 30.0–36.0)
MCV: 84.4 fL (ref 78.0–100.0)
PLATELETS: 325 10*3/uL (ref 150–400)
RBC: 5.26 MIL/uL — AB (ref 3.87–5.11)
RDW: 13 % (ref 11.5–15.5)
WBC: 9.3 10*3/uL (ref 4.0–10.5)

## 2017-06-30 LAB — MAGNESIUM: MAGNESIUM: 1.8 mg/dL (ref 1.7–2.4)

## 2017-06-30 LAB — I-STAT BETA HCG BLOOD, ED (MC, WL, AP ONLY): I-stat hCG, quantitative: <5 m[IU]/mL (ref <5)

## 2017-06-30 LAB — I-STAT TROPONIN, ED: Troponin i, poc: 0 ng/mL (ref 0.00–0.08)

## 2017-06-30 LAB — APTT: aPTT: 27 seconds (ref 24–36)

## 2017-06-30 LAB — PROTIME-INR
INR: 1.06
Prothrombin Time: 13.7 seconds (ref 11.4–15.2)

## 2017-06-30 MED ORDER — IOPAMIDOL (ISOVUE-370) INJECTION 76%
INTRAVENOUS | Status: AC
  Administered 2017-06-30: 100 mL via INTRAVENOUS
  Filled 2017-06-30: qty 100

## 2017-06-30 MED ORDER — DIPHENHYDRAMINE HCL 50 MG/ML IJ SOLN
25.0000 mg | Freq: Once | INTRAMUSCULAR | Status: AC
  Administered 2017-07-01: 25 mg via INTRAVENOUS
  Filled 2017-06-30: qty 1

## 2017-06-30 MED ORDER — METOCLOPRAMIDE HCL 5 MG/ML IJ SOLN
10.0000 mg | Freq: Once | INTRAMUSCULAR | Status: AC
  Administered 2017-07-01: 10 mg via INTRAVENOUS
  Filled 2017-06-30: qty 2

## 2017-06-30 MED ORDER — SODIUM CHLORIDE 0.9 % IV BOLUS (SEPSIS)
1000.0000 mL | Freq: Once | INTRAVENOUS | Status: AC
  Administered 2017-07-01: 1000 mL via INTRAVENOUS

## 2017-06-30 MED ORDER — SODIUM CHLORIDE 0.9 % IJ SOLN
INTRAMUSCULAR | Status: AC
  Administered 2017-07-01: 01:00:00
  Filled 2017-06-30: qty 50

## 2017-06-30 NOTE — ED Notes (Signed)
Bed: WA03 Expected date:  Expected time:  Means of arrival:  Comments: Triage 3 

## 2017-06-30 NOTE — ED Triage Notes (Signed)
Pt presents to the ED with c/o altered mental status. Pt was very tearful upon checking in. Pt reports that her thoughts won't connect. Pt is able to answer each question that is asked of her but is very slow to answer. Pt reports that all of this started approx 30-40 minutes ago.

## 2017-06-30 NOTE — ED Notes (Signed)
Telestroke on at bedside

## 2017-06-30 NOTE — ED Provider Notes (Signed)
[facility] DEPT Provider Note   CSN: [id number] Arrival date & time: 06/30/17  2230     History   Chief Complaint Chief Complaint  Patient presents with  . Altered Mental Status    HPI Debra [name] is a 42 y.o. female.   The history is provided by the patient.   Altered Mental Status    She has history of hypertension, diabetes, asthma and comes in with onset at about 10:20 PM of severe right frontal headache.  Headache is rated at 8/10.  She has difficulty describing it.  She has noted some difficulty with speech since onset of headache.  Last time she was able to speak normally was 10 PM.  She denies vision change, nausea, vomiting.  She has noted she is slightly off balance when walking but has not noticed any focal weakness.  She denies having had similar headaches in the past.  Nothing seems to make it better, nothing makes it worse.    Past Medical History:  Diagnosis Date  . Allergic rhinitis, cause unspecified   . Asthma   . Diabetes mellitus type II 08/2009 dx  . Endometriosis   . Hypertension   . Knee pain, right    DJD, post traumatic with repeat falls  . Morbid obesity ([place])    s/p lab band 09/2010  . Ovarian cyst    right s/p resection June 2013    Patient Active Problem List   Diagnosis Date Noted  . Drug allergy 04/24/2017  . Acute maxillary sinusitis 04/19/2017  . Low back pain 10/18/2016  . Abscess of chest wall 06/21/2016  . Left shoulder pain 05/21/2016  . Routine general medical examination at a health care facility 10/13/2015  . Endometriosis 11/03/2013  . Right knee pain   . Hypertension 08/26/2011  . Hx of laparoscopic gastric banding, 10/02/2010. 11/27/2010  . Cough 02/23/2010  . Diabetes mellitus type 2, uncontrolled ([place]) 08/23/2009  . Morbid obesity ([place]) 08/22/2009  . Allergic rhinitis 08/22/2009  . Asthma 08/22/2009  . ARTHRITIS 08/22/2009    Past Surgical History:  Procedure Laterality Date  .  ABDOMINAL HYSTERECTOMY  05/14/12   [place]  . LAPAROSCOPIC GASTRIC BANDING  10/02/2010   start weight 349#  . SALPINGOOPHORECTOMY  10/08/11   right    OB History    No data available       Home Medications    Prior to Admission medications   Medication Sig Start Date End Date Taking? Authorizing Provider  albuterol (PROAIR HFA) 108 (90 BASE) MCG/ACT inhaler Inhale 2 puffs into the lungs every 4 (four) hours as needed for wheezing or shortness of breath. 04/18/15   Rowe Clack, MD  albuterol (PROVENTIL) (2.5 MG/3ML) 0.083% nebulizer solution Take 2.5 mg by nebulization every 6 (six) hours as needed for wheezing or shortness of breath.    [provider]  benzonatate (TESSALON) 200 MG capsule Take 1 capsule (200 mg total) by mouth 3 (three) times daily as needed. Swallow whole-do not bite pill 04/19/17   Tower, Wynelle Fanny, MD  blood glucose meter kit and supplies Dispense based on patient and insurance preference. Use up to four times daily as directed. (FOR ICD-9 250.00, 250.01). 06/03/14   Rowe Clack, MD  dextromethorphan-guaiFENesin [facility] DM) 30-600 MG 12hr tablet Take 1 tablet by mouth 2 (two) times daily as needed for cough. 02/18/17   Nche, Charlene Brooke, NP  diphenhydrAMINE (BENADRYL) 25 mg capsule Take 1 capsule (25  mg total) by mouth every 6 (six) hours as needed. 07/13/16   Shary Decamp, PA-C  doxycycline (VIBRA-TABS) 100 MG tablet Take 1 tablet (100 mg total) by mouth 2 (two) times daily. 04/24/17   Biagio Borg, MD  empagliflozin (JARDIANCE) 25 MG TABS tablet Take 25 mg by mouth daily. 04/24/17   Biagio Borg, MD  fexofenadine (ALLEGRA) 180 MG tablet Take 180 mg by mouth daily as needed. For seasonal allergies    [provider]  fluticasone (FLONASE) 50 MCG/ACT nasal spray Place 2 sprays into both nostrils daily. 09/23/16   Delano Metz, FNP  Fluticasone-Salmeterol (ADVAIR DISKUS) 250-50 MCG/DOSE AEPB Inhale 1 puff into the lungs 2  (two) times daily. 07/22/11   Rowe Clack, MD  glipiZIDE (GLUCOTROL) 5 MG tablet TAKE 1 TABLET(5 MG) BY MOUTH TWICE DAILY BEFORE A MEAL 05/15/17   Hoyt Koch, MD  glucose blood (ONE TOUCH ULTRA TEST) test strip Use as directed 10/28/13   Rowe Clack, MD  HYDROcodone-homatropine [facility]) 5-1.5 MG/5ML syrup Take 5 mLs by mouth every 6 (six) hours as needed for cough. 02/18/17   Nche, Charlene Brooke, NP  ibuprofen (ADVIL,MOTRIN) 800 MG tablet Take 1 tablet (800 mg total) by mouth every 8 (eight) hours as needed. 04/23/16   Hoyt Koch, MD  losartan (COZAAR) 100 MG tablet TAKE 1 TABLET BY MOUTH DAILY 06/02/17   Hoyt Koch, MD  mometasone (NASONEX) 50 MCG/ACT nasal spray Place 2 sprays into the nose daily as needed. For nasal spray    [provider]  norethindrone (AYGESTIN) 5 MG tablet Take 1 by mouth daily 07/06/13   [provider]  olopatadine (PATANOL) 0.1 % ophthalmic solution Place 1 drop into both eyes 2 (two) times daily. 08/08/12   Darlyne Russian, MD  sodium chloride (OCEAN) 0.65 % SOLN nasal spray Place 1 spray into both nostrils as needed for congestion. 02/18/17   Nche, Charlene Brooke, NP  TRULICITY 1.5 NG/2.9BM SOPN INJECT 1.5 MG INTO THE SKIN ONCE A WEEK 12/24/16   Hoyt Koch, MD    Family History Family History  Problem Relation Age of Onset  . Heart disease Mother   . Hypertension Mother   . Diabetes Mother   . Stroke Father 42  . Diabetes Father     Social History Social History   Tobacco Use  . Smoking status: Never Smoker  . Smokeless tobacco: Never Used  Substance Use Topics  . Alcohol use: No    Alcohol/week: 0.0 oz  . Drug use: No     Allergies   Influenza vaccines; Metformin and related; Morphine; and Augmentin [amoxicillin-pot clavulanate]   Review of Systems Review of Systems  All other systems reviewed and are negative.    Physical Exam Updated Vital Signs BP (!) 202/122 (BP  Location: Left Arm)   Pulse 69   Temp 98.6 F (37 C) (Oral)   Resp 18   Ht _0  (1.727 m)   Wt (!) 148.3 kg (327 lb)   LMP 07/28/2011   SpO2 99%   BMI 49.72 kg/m    Physical Exam  Nursing note and vitals reviewed.  42 year old female, resting comfortably and in no acute distress. Vital signs are significant for markedly elevated blood pressure. Oxygen saturation is 99%, which is normal. Head is normocephalic and atraumatic. PERRLA, EOMI. Oropharynx is clear.  There are no carotid bruits. Neck is nontender and supple without adenopathy or JVD. Back is nontender and  there is no CVA tenderness. Lungs are clear without rales, wheezes, or rhonchi. Chest is nontender. Heart has regular rate and rhythm without murmur. Abdomen is soft, flat, nontender without masses or hepatosplenomegaly and peristalsis is normoactive. Extremities have no cyanosis or edema, full range of motion is present. Skin is warm and dry without rash. Neurologic: She is awake and alert but speech is slightly slow.  She has mild difficulty with naming, but speech is without dysarthria. Cranial nerves are intact, there are no motor or sensory deficits.  There is no pronator drift.  ED Treatments / Results  Labs (all labs ordered are listed, but only abnormal results are displayed) Labs Reviewed  CBC - Abnormal; Notable for the following components:      Result Value   RBC 5.26 (*)    All other components within normal limits  COMPREHENSIVE METABOLIC PANEL - Abnormal; Notable for the following components:   Glucose, Bld 206 (*)    Calcium 8.7 (*)    All other components within normal limits  I-STAT CHEM 8, ED - Abnormal; Notable for the following components:   BUN 4 (*)    Glucose, Bld 208 (*)    Calcium, Ion 1.11 (*)    Hemoglobin 15.6 (*)    All other components within normal limits  PROTIME-INR  APTT  DIFFERENTIAL  MAGNESIUM  HIV ANTIBODY (ROUTINE TESTING)  HEMOGLOBIN A1C  LIPID PANEL  I-STAT  TROPONIN, ED  I-STAT BETA HCG BLOOD, ED ([facility], [facility], [facility] ONLY)    EKG  EKG Interpretation  Date/Time:  Monday June 30 2017 23:10:28 EDT Ventricular Rate:  67 PR Interval:    QRS Duration: 84 QT Interval:  486 QTC Calculation: 514 R Axis:   48 Text Interpretation:  Sinus rhythm Atrial premature complex Borderline T abnormalities, diffuse leads Prolonged QT interval When compared with ECG of 06/19/2010, QT has lengthened Nonspecific T wave abnormality is now present Premature atrial complexes are now present Confirmed by Delora Fuel ([id number]) on 06/30/2017 11:13:46 PM       Radiology Ct Angio Head W Or Wo Contrast  Result Date: 07/01/2017 CLINICAL DATA:  42 y/o F; altered mental status, evaluation for stroke. EXAM: CT ANGIOGRAPHY HEAD AND NECK TECHNIQUE: Multidetector CT imaging of the head and neck was performed using the standard protocol during bolus administration of intravenous contrast. Multiplanar CT image reconstructions and MIPs were obtained to evaluate the vascular anatomy. Carotid stenosis measurements (when applicable) are obtained utilizing NASCET criteria, using the distal internal carotid diameter as the denominator. CONTRAST:  110m ISOVUE-370 IOPAMIDOL (ISOVUE-370) INJECTION 76% COMPARISON:  06/30/2017 CT head. FINDINGS: CTA NECK FINDINGS Aortic arch: Standard branching. Imaged portion shows no evidence of aneurysm or dissection. No significant stenosis of the major arch vessel origins. Right carotid system: No evidence of dissection, stenosis (50% or greater) or occlusion. Left carotid system: No evidence of dissection, stenosis (50% or greater) or occlusion. Vertebral arteries: Codominant. No evidence of dissection, stenosis (50% or greater) or occlusion. Skeleton: Negative. Other neck: Right floor of mouth fat containing structure measuring 14 mm, probably a small lipoma, partially obscured by streak artifact from dental hardware (series 5, image 57). Upper chest: Negative. Review  of the MIP images confirms the above findings CTA HEAD FINDINGS Anterior circulation: No large vessel occlusion, aneurysm, or vascular malformation. Posterior circulation: No large vessel occlusion, aneurysm, or vascular malformation. Multiple segments of stenosis are present in the anterior and posterior circulation best appreciated in the right superior M2, left inferior M2,  right P1, left P2 segments. Venous sinuses: As permitted by contrast timing, patent. Anatomic variants: Diminutive right and small left posterior communicating arteries. No anterior communicating artery identified. Delayed phase: No abnormal intracranial enhancement. Review of the MIP images confirms the above findings IMPRESSION: 1. Multiple segments of arterial stenosis in the anterior and posterior circulation which may represent cerebral vasculitis or possibly reversible cerebral vasoconstriction syndrome (RCVS)/PRES given posterior distribution cortical abnormality. 2. No large vessel occlusion, aneurysm, or vascular malformation. 3. Widely patent carotid and vertebral arteries in the neck. These results were called by telephone at the time of interpretation on 06/30/2017 at 11:58 pm to Dr. Delora Fuel , who verbally acknowledged these results. Electronically Signed   By: Kristine Garbe M.D.   On: 07/01/2017 00:05   Ct Angio Neck W Or Wo Contrast  Result Date: 07/01/2017 CLINICAL DATA:  42 y/o F; altered mental status, evaluation for stroke. EXAM: CT ANGIOGRAPHY HEAD AND NECK TECHNIQUE: Multidetector CT imaging of the head and neck was performed using the standard protocol during bolus administration of intravenous contrast. Multiplanar CT image reconstructions and MIPs were obtained to evaluate the vascular anatomy. Carotid stenosis measurements (when applicable) are obtained utilizing NASCET criteria, using the distal internal carotid diameter as the denominator. CONTRAST:  135m ISOVUE-370 IOPAMIDOL (ISOVUE-370) INJECTION  76% COMPARISON:  06/30/2017 CT head. FINDINGS: CTA NECK FINDINGS Aortic arch: Standard branching. Imaged portion shows no evidence of aneurysm or dissection. No significant stenosis of the major arch vessel origins. Right carotid system: No evidence of dissection, stenosis (50% or greater) or occlusion. Left carotid system: No evidence of dissection, stenosis (50% or greater) or occlusion. Vertebral arteries: Codominant. No evidence of dissection, stenosis (50% or greater) or occlusion. Skeleton: Negative. Other neck: Right floor of mouth fat containing structure measuring 14 mm, probably a small lipoma, partially obscured by streak artifact from dental hardware (series 5, image 57). Upper chest: Negative. Review of the MIP images confirms the above findings CTA HEAD FINDINGS Anterior circulation: No large vessel occlusion, aneurysm, or vascular malformation. Posterior circulation: No large vessel occlusion, aneurysm, or vascular malformation. Multiple segments of stenosis are present in the anterior and posterior circulation best appreciated in the right superior M2, left inferior M2, right P1, left P2 segments. Venous sinuses: As permitted by contrast timing, patent. Anatomic variants: Diminutive right and small left posterior communicating arteries. No anterior communicating artery identified. Delayed phase: No abnormal intracranial enhancement. Review of the MIP images confirms the above findings IMPRESSION: 1. Multiple segments of arterial stenosis in the anterior and posterior circulation which may represent cerebral vasculitis or possibly reversible cerebral vasoconstriction syndrome (RCVS)/PRES given posterior distribution cortical abnormality. 2. No large vessel occlusion, aneurysm, or vascular malformation. 3. Widely patent carotid and vertebral arteries in the neck. These results were called by telephone at the time of interpretation on 06/30/2017 at 11:58 pm to Dr. DDelora Fuel, who verbally acknowledged  these results. Electronically Signed   By: LKristine GarbeM.D.   On: 07/01/2017 00:05   Ct Head Code Stroke Wo Contrast  Result Date: 06/30/2017 CLINICAL DATA:  Code stroke. 42y/o F; altered mental status, code stroke. EXAM: CT HEAD WITHOUT CONTRAST TECHNIQUE: Contiguous axial images were obtained from the base of the skull through the vertex without intravenous contrast. COMPARISON:  None. FINDINGS: Brain: Small 14 mm wedge-shaped focus of hypoattenuation within the left parieto-occipital junction (series 2, image 18) which may represent an area of acute or subacute infarction. No hemorrhage or mass effect. No  additional area of stroke, hemorrhage, or mass effect identified. No hydrocephalus, effacement of basilar cisterns, or extra-axial collection. Vascular: No hyperdense vessel or unexpected calcification. Skull: Normal. Negative for fracture or focal lesion. Sinuses/Orbits: No acute finding. Other: None. ASPECTS [facility] Stroke Program Early CT Score) - Ganglionic level infarction (caudate, lentiform nuclei, internal capsule, insula, M1-M3 cortex): 7 - Supraganglionic infarction (M4-M6 cortex): 3 Total score (0-10 with 10 being normal): 10 IMPRESSION: 1. Small 14 mm cortical focus of hypoattenuation within left parieto-occipital junction which may represent acute or subacute infarction. No hemorrhage or mass effect. 2. ASPECTS is 10 These results were called by telephone at the time of interpretation on 06/30/2017 at 11:26 pm to Dr. Delora Fuel , who verbally acknowledged these results. Electronically Signed   By: Kristine Garbe M.D.   On: 06/30/2017 23:29    Procedures Procedures  CRITICAL CARE Performed by: Delora Fuel Total critical care time: 50 minutes Critical care time was exclusive of separately billable procedures and treating other patients. Critical care was necessary to treat or prevent imminent or life-threatening deterioration. Critical care was time spent  personally by me on the following activities: development of treatment plan with patient and/or surrogate as well as nursing, discussions with consultants, evaluation of patient's response to treatment, examination of patient, obtaining history from patient or surrogate, ordering and performing treatments and interventions, ordering and review of laboratory studies, ordering and review of radiographic studies, pulse oximetry and re-evaluation of patient's condition.  Medications Ordered in ED Medications  albuterol (PROVENTIL) (2.5 MG/3ML) 0.083% nebulizer solution 2.5 mg (not administered)  loratadine (CLARITIN) tablet 10 mg (not administered)  losartan (COZAAR) tablet 100 mg (not administered)  Dulaglutide SOPN 1.5 mg (not administered)   stroke: mapping our early stages of recovery book (not administered)  0.9 %  sodium chloride infusion (not administered)  acetaminophen (TYLENOL) tablet 650 mg (not administered)    Or  acetaminophen (TYLENOL) solution 650 mg (not administered)    Or  acetaminophen (TYLENOL) suppository 650 mg (not administered)  aspirin suppository 300 mg (not administered)    Or  aspirin tablet 325 mg (not administered)  hydrALAZINE (APRESOLINE) injection 10 mg (not administered)  insulin aspart (novoLOG) injection 0-9 Units (not administered)  sodium chloride 0.9 % bolus 1,000 mL (1,000 mLs Intravenous New Bag/Given 07/01/17 0011)  metoCLOPramide (REGLAN) injection 10 mg (10 mg Intravenous Given 07/01/17 0011)  diphenhydrAMINE (BENADRYL) injection 25 mg (25 mg Intravenous Given 07/01/17 0011)  sodium chloride 0.9 % injection (  Given by Other 07/01/17 0047)  iopamidol (ISOVUE-370) 76 % injection (100 mLs Intravenous Contrast Given 06/30/17 2325)  aspirin chewable tablet 324 mg (324 mg Oral Given 07/01/17 0051)  hydrALAZINE (APRESOLINE) injection 10 mg (10 mg Intravenous Given 07/01/17 0217)     Initial Impression / Assessment and Plan / ED Course  I have reviewed the  triage vital signs and the nursing notes.  Pertinent labs & imaging results that were available during my care of the patient were reviewed by me and considered in my medical decision making (see chart for details).  Headache with mild a aphasia and severely elevated blood pressure.  She is started on code stroke protocol, but it is possible this is a migraine headache with associated neurologic deficit.  She is being sent for CT of head, but will also start her with a migraine cocktail.  Old records are reviewed, and she has prior ED visits related to blood pressure, but no visits for headache or neurologic difficulty.  CT does show small area of hypoattenuation in the left parieto-occipital junction.  This could conceivably account for her findings.  Consultation was obtained with tele-neurology, and they noted that speech became normal during the course of their evaluation and gave NIH stroke scale of 3.  CT angiogram is reported to show multiple segments of stenosis which may represent vasospasm, PRES, or vasculitis.  Tele-neurologist has reviewed angiogram and is not impressed.  Headache has resolved completely with headache cocktail, but blood pressure continues to be elevated.  She is given intravenous hydralazine for blood pressure control.  At this point, given abnormal CT scan and CT angiogram, symptoms will be treated as TIA although it may have been strictly related to migraine headache.  Case is discussed with Dr. Hal Hope of [facility] hospitalist who agrees to admit the patient.  Final Clinical Impressions(s) / ED Diagnoses   Final diagnoses:  TIA (transient ischemic attack)  Hypertensive urgency  Bad headache    ED Discharge Orders    None      Delora Fuel, MD 83/33/83 [phone]

## 2017-07-01 ENCOUNTER — Other Ambulatory Visit: Payer: Self-pay

## 2017-07-01 ENCOUNTER — Observation Stay (HOSPITAL_COMMUNITY): Payer: BC Managed Care – PPO

## 2017-07-01 ENCOUNTER — Encounter (HOSPITAL_COMMUNITY): Payer: Self-pay | Admitting: Internal Medicine

## 2017-07-01 DIAGNOSIS — J45909 Unspecified asthma, uncomplicated: Secondary | ICD-10-CM | POA: Diagnosis not present

## 2017-07-01 DIAGNOSIS — I639 Cerebral infarction, unspecified: Secondary | ICD-10-CM | POA: Diagnosis not present

## 2017-07-01 DIAGNOSIS — E118 Type 2 diabetes mellitus with unspecified complications: Secondary | ICD-10-CM | POA: Diagnosis not present

## 2017-07-01 DIAGNOSIS — R402142 Coma scale, eyes open, spontaneous, at arrival to emergency department: Secondary | ICD-10-CM | POA: Diagnosis not present

## 2017-07-01 DIAGNOSIS — I674 Hypertensive encephalopathy: Secondary | ICD-10-CM | POA: Diagnosis present

## 2017-07-01 DIAGNOSIS — R29700 NIHSS score 0: Secondary | ICD-10-CM | POA: Diagnosis not present

## 2017-07-01 DIAGNOSIS — I959 Hypotension, unspecified: Secondary | ICD-10-CM | POA: Diagnosis not present

## 2017-07-01 DIAGNOSIS — I161 Hypertensive emergency: Secondary | ICD-10-CM | POA: Diagnosis not present

## 2017-07-01 DIAGNOSIS — I160 Hypertensive urgency: Secondary | ICD-10-CM | POA: Diagnosis not present

## 2017-07-01 DIAGNOSIS — E876 Hypokalemia: Secondary | ICD-10-CM | POA: Diagnosis not present

## 2017-07-01 DIAGNOSIS — G459 Transient cerebral ischemic attack, unspecified: Secondary | ICD-10-CM | POA: Diagnosis not present

## 2017-07-01 DIAGNOSIS — Z6841 Body Mass Index (BMI) 40.0 and over, adult: Secondary | ICD-10-CM | POA: Diagnosis not present

## 2017-07-01 DIAGNOSIS — E1165 Type 2 diabetes mellitus with hyperglycemia: Secondary | ICD-10-CM | POA: Diagnosis not present

## 2017-07-01 DIAGNOSIS — I10 Essential (primary) hypertension: Secondary | ICD-10-CM | POA: Diagnosis not present

## 2017-07-01 DIAGNOSIS — R29810 Facial weakness: Secondary | ICD-10-CM | POA: Diagnosis not present

## 2017-07-01 DIAGNOSIS — Z885 Allergy status to narcotic agent status: Secondary | ICD-10-CM | POA: Diagnosis not present

## 2017-07-01 DIAGNOSIS — Z8673 Personal history of transient ischemic attack (TIA), and cerebral infarction without residual deficits: Secondary | ICD-10-CM | POA: Diagnosis present

## 2017-07-01 DIAGNOSIS — R471 Dysarthria and anarthria: Secondary | ICD-10-CM | POA: Diagnosis not present

## 2017-07-01 DIAGNOSIS — I9589 Other hypotension: Secondary | ICD-10-CM | POA: Diagnosis not present

## 2017-07-01 DIAGNOSIS — Z9884 Bariatric surgery status: Secondary | ICD-10-CM | POA: Diagnosis not present

## 2017-07-01 DIAGNOSIS — Z888 Allergy status to other drugs, medicaments and biological substances status: Secondary | ICD-10-CM | POA: Diagnosis not present

## 2017-07-01 DIAGNOSIS — Z887 Allergy status to serum and vaccine status: Secondary | ICD-10-CM | POA: Diagnosis not present

## 2017-07-01 DIAGNOSIS — I63412 Cerebral infarction due to embolism of left middle cerebral artery: Secondary | ICD-10-CM | POA: Diagnosis present

## 2017-07-01 DIAGNOSIS — E7849 Other hyperlipidemia: Secondary | ICD-10-CM | POA: Diagnosis not present

## 2017-07-01 DIAGNOSIS — R402252 Coma scale, best verbal response, oriented, at arrival to emergency department: Secondary | ICD-10-CM | POA: Diagnosis not present

## 2017-07-01 DIAGNOSIS — R4701 Aphasia: Secondary | ICD-10-CM | POA: Diagnosis not present

## 2017-07-01 DIAGNOSIS — R402362 Coma scale, best motor response, obeys commands, at arrival to emergency department: Secondary | ICD-10-CM | POA: Diagnosis not present

## 2017-07-01 DIAGNOSIS — I6603 Occlusion and stenosis of bilateral middle cerebral arteries: Secondary | ICD-10-CM | POA: Diagnosis not present

## 2017-07-01 DIAGNOSIS — Z79899 Other long term (current) drug therapy: Secondary | ICD-10-CM | POA: Diagnosis not present

## 2017-07-01 DIAGNOSIS — E785 Hyperlipidemia, unspecified: Secondary | ICD-10-CM | POA: Diagnosis not present

## 2017-07-01 DIAGNOSIS — Z794 Long term (current) use of insulin: Secondary | ICD-10-CM | POA: Diagnosis not present

## 2017-07-01 LAB — CBG MONITORING, ED
Glucose-Capillary: 230 mg/dL — ABNORMAL HIGH (ref 65–99)
Glucose-Capillary: 197 mg/dL — ABNORMAL HIGH (ref 65–99)
Glucose-Capillary: 190 mg/dL — ABNORMAL HIGH (ref 65–99)

## 2017-07-01 LAB — GLUCOSE, CAPILLARY: Glucose-Capillary: 183 mg/dL — ABNORMAL HIGH (ref 65–99)

## 2017-07-01 LAB — LIPID PANEL
CHOLESTEROL: 168 mg/dL (ref 0–200)
HDL: 27 mg/dL — ABNORMAL LOW (ref >40)
LDL Cholesterol: 129 mg/dL — ABNORMAL HIGH (ref 0–99)
TRIGLYCERIDES: 60 mg/dL (ref <150)
Total CHOL/HDL Ratio: 6.2 RATIO
VLDL: 12 mg/dL (ref 0–40)

## 2017-07-01 LAB — SEDIMENTATION RATE: SED RATE: 13 mm/h (ref 0–22)

## 2017-07-01 LAB — C-REACTIVE PROTEIN: CRP: 1 mg/dL — ABNORMAL HIGH (ref <1.0)

## 2017-07-01 LAB — HEMOGLOBIN A1C
Hgb A1c MFr Bld: 8.5 % — ABNORMAL HIGH (ref 4.8–5.6)
MEAN PLASMA GLUCOSE: 197.25 mg/dL

## 2017-07-01 LAB — ANTITHROMBIN III: ANTITHROMB III FUNC: 85 % (ref 75–120)

## 2017-07-01 LAB — HIV ANTIBODY (ROUTINE TESTING W REFLEX): HIV SCREEN 4TH GENERATION: NONREACTIVE

## 2017-07-01 MED ORDER — SODIUM CHLORIDE 0.9 % IV SOLN
1000.0000 mg | INTRAVENOUS | Status: DC
  Filled 2017-07-01 (×2): qty 8

## 2017-07-01 MED ORDER — ACETAMINOPHEN 650 MG RE SUPP: 650.0000 mg | RECTAL | Status: DC | PRN

## 2017-07-01 MED ORDER — LORAZEPAM 2 MG/ML IJ SOLN: 1.0000 mg | Freq: Once | INTRAMUSCULAR | Status: DC

## 2017-07-01 MED ORDER — ASPIRIN 81 MG PO CHEW
324.0000 mg | CHEWABLE_TABLET | Freq: Once | ORAL | Status: AC
  Administered 2017-07-01: 324 mg via ORAL
  Filled 2017-07-01: qty 4

## 2017-07-01 MED ORDER — ASPIRIN 300 MG RE SUPP: 300.0000 mg | Freq: Every day | RECTAL | Status: DC

## 2017-07-01 MED ORDER — DULAGLUTIDE 1.5 MG/0.5ML ~~LOC~~ SOAJ: 1.5000 mg | SUBCUTANEOUS | Status: DC

## 2017-07-01 MED ORDER — ASPIRIN 325 MG PO TABS
325.0000 mg | ORAL_TABLET | Freq: Every day | ORAL | Status: DC
  Administered 2017-07-01 – 2017-07-07 (×7): 325 mg via ORAL
  Filled 2017-07-01 (×7): qty 1

## 2017-07-01 MED ORDER — LORATADINE 10 MG PO TABS
10.0000 mg | ORAL_TABLET | Freq: Every day | ORAL | Status: DC
  Administered 2017-07-01 – 2017-07-09 (×8): 10 mg via ORAL
  Filled 2017-07-01 (×8): qty 1

## 2017-07-01 MED ORDER — INSULIN GLARGINE 100 UNIT/ML ~~LOC~~ SOLN
10.0000 [IU] | Freq: Every day | SUBCUTANEOUS | Status: DC
  Administered 2017-07-02 (×2): 10 [IU] via SUBCUTANEOUS
  Filled 2017-07-01 (×4): qty 0.1

## 2017-07-01 MED ORDER — ACETAMINOPHEN 160 MG/5ML PO SOLN: 650.0000 mg | ORAL | Status: DC | PRN

## 2017-07-01 MED ORDER — LABETALOL HCL 5 MG/ML IV SOLN
20.0000 mg | INTRAVENOUS | Status: DC | PRN
  Administered 2017-07-01 (×2): 20 mg via INTRAVENOUS
  Filled 2017-07-01 (×2): qty 4

## 2017-07-01 MED ORDER — ACETAMINOPHEN 325 MG PO TABS
650.0000 mg | ORAL_TABLET | ORAL | Status: DC | PRN
  Administered 2017-07-05: 650 mg via ORAL
  Filled 2017-07-01: qty 2

## 2017-07-01 MED ORDER — ATORVASTATIN CALCIUM 80 MG PO TABS
80.0000 mg | ORAL_TABLET | Freq: Every day | ORAL | Status: DC
  Administered 2017-07-01 – 2017-07-08 (×7): 80 mg via ORAL
  Filled 2017-07-01 (×8): qty 1

## 2017-07-01 MED ORDER — HYDRALAZINE HCL 20 MG/ML IJ SOLN
10.0000 mg | Freq: Once | INTRAMUSCULAR | Status: AC
  Administered 2017-07-01: 10 mg via INTRAVENOUS
  Filled 2017-07-01: qty 1

## 2017-07-01 MED ORDER — STROKE: EARLY STAGES OF RECOVERY BOOK
Freq: Once | Status: AC
  Administered 2017-07-01: 09:00:00
  Filled 2017-07-01: qty 1

## 2017-07-01 MED ORDER — LORAZEPAM 2 MG/ML IJ SOLN
INTRAMUSCULAR | Status: AC
  Filled 2017-07-01: qty 1

## 2017-07-01 MED ORDER — INSULIN ASPART 100 UNIT/ML ~~LOC~~ SOLN
0.0000 [IU] | Freq: Three times a day (TID) | SUBCUTANEOUS | Status: DC
Start: 2017-07-01 — End: 2017-07-09
  Administered 2017-07-01: 3 [IU] via SUBCUTANEOUS
  Administered 2017-07-01 – 2017-07-02 (×3): 2 [IU] via SUBCUTANEOUS
  Administered 2017-07-02 – 2017-07-03 (×3): 3 [IU] via SUBCUTANEOUS
  Administered 2017-07-03 – 2017-07-04 (×2): 1 [IU] via SUBCUTANEOUS
  Administered 2017-07-04: 2 [IU] via SUBCUTANEOUS
  Administered 2017-07-05: 3 [IU] via SUBCUTANEOUS
  Administered 2017-07-05: 5 [IU] via SUBCUTANEOUS
  Administered 2017-07-05: 3 [IU] via SUBCUTANEOUS
  Administered 2017-07-06: 5 [IU] via SUBCUTANEOUS
  Administered 2017-07-06: 2 [IU] via SUBCUTANEOUS
  Administered 2017-07-06: 1 [IU] via SUBCUTANEOUS
  Administered 2017-07-07: 2 [IU] via SUBCUTANEOUS
  Administered 2017-07-08: 3 [IU] via SUBCUTANEOUS
  Administered 2017-07-08: 2 [IU] via SUBCUTANEOUS
  Administered 2017-07-08: 7 [IU] via SUBCUTANEOUS
  Administered 2017-07-09: 2 [IU] via SUBCUTANEOUS
  Filled 2017-07-01 (×3): qty 1

## 2017-07-01 MED ORDER — LOSARTAN POTASSIUM 50 MG PO TABS
100.0000 mg | ORAL_TABLET | Freq: Every day | ORAL | Status: DC
  Administered 2017-07-01 – 2017-07-04 (×4): 100 mg via ORAL
  Filled 2017-07-01 (×5): qty 2

## 2017-07-01 MED ORDER — HYDRALAZINE HCL 20 MG/ML IJ SOLN: 10.0000 mg | INTRAMUSCULAR | Status: DC | PRN

## 2017-07-01 MED ORDER — AMLODIPINE BESYLATE 10 MG PO TABS
10.0000 mg | ORAL_TABLET | Freq: Every day | ORAL | Status: DC
  Administered 2017-07-01 – 2017-07-04 (×3): 10 mg via ORAL
  Filled 2017-07-01 (×2): qty 1
  Filled 2017-07-01 (×2): qty 2
  Filled 2017-07-01: qty 1
  Filled 2017-07-01: qty 2

## 2017-07-01 MED ORDER — ENOXAPARIN SODIUM 40 MG/0.4ML ~~LOC~~ SOLN
40.0000 mg | SUBCUTANEOUS | Status: DC
  Filled 2017-07-01: qty 0.4

## 2017-07-01 MED ORDER — SODIUM CHLORIDE 0.9 % IV SOLN
INTRAVENOUS | Status: DC
  Administered 2017-07-01: 08:00:00 via INTRAVENOUS

## 2017-07-01 MED ORDER — ALBUTEROL SULFATE (2.5 MG/3ML) 0.083% IN NEBU: 2.5000 mg | INHALATION_SOLUTION | Freq: Four times a day (QID) | RESPIRATORY_TRACT | Status: DC | PRN

## 2017-07-01 NOTE — Progress Notes (Signed)
PT Cancellation Note  Patient Details Name: Debra [name] MRN: [id number] DOB: 10/25/1975   Cancelled Treatment:    Reason Eval/Treat Not Completed: Medical issues which prohibited therapy, noted BP remains high with diastolic > 100. Will check back another time when stable.    Rada HayHill, Marwin Primmer Elizabeth 07/01/2017, 8:53 AM Blanchard KelchKaren Keegan Bensch PT [phone]

## 2017-07-01 NOTE — ED Notes (Signed)
Patient in MRI 

## 2017-07-01 NOTE — ED Notes (Addendum)
Spoke with pharmacologist regarding solu-medrol order- verified.

## 2017-07-01 NOTE — H&P (Signed)
History and Physical    Debra [name] DOB: 03/19/1976 DOA: 06/30/2017  PCP: Hoyt Koch, MD  Patient coming from: Home.  Chief Complaint: Difficulty speaking and headache.  HPI: Debra [name] is a 42 y.o. female with history of hypertension and diabetes mellitus type 2 started to experience headache with difficulty speaking around 10 PM last night.  Patient headache was in the right temporal area just pounding in nature and severe with no associated visual symptoms.  Patient found it difficult to understand things and speak.  Patient herself drove to the ER.  Denies any weakness of the extremities.  Denies any difficulty swallowing.  Has not had any routine headaches.  Patient had mild tingling of the first 2 fingers in the right hand.  ED Course: The ER patient blood pressure was more than 702 systolic.  CT angiogram of the neck was done which did not show any large vessel occlusion but features were concerning for cerebral vasculitis versus PRES versus RCVS.  Tele neurology was consulted and this time they felt that patient does not have acute stroke.  Patient was given hydralazine followed by diphenhydramine and Compazine hand patient's headache and symptoms largely improved.  Patient is being admitted for further management of possible hypertensive encephalopathy versus TIA and on-call neurologist at [facility] has been alerted.  On my exam patient appears nonfocal.  States the headache is largely resolved.  Review of Systems: As per HPI, rest all negative.   Past Medical History:  Diagnosis Date  . Allergic rhinitis, cause unspecified   . Asthma   . Diabetes mellitus type II 08/2009 dx  . Endometriosis   . Hypertension   . Knee pain, right    DJD, post traumatic with repeat falls  . Morbid obesity ([place])    s/p lab band 09/2010  . Ovarian cyst    right s/p resection June 2013    Past Surgical History:  Procedure Laterality Date  . ABDOMINAL  HYSTERECTOMY  05/14/12   [place]  . LAPAROSCOPIC GASTRIC BANDING  10/02/2010   start weight 349#  . SALPINGOOPHORECTOMY  10/08/11   right     reports that  has never smoked. she has never used smokeless tobacco. She reports that she does not drink alcohol or use drugs.  Allergies  Allergen Reactions  . Influenza Vaccines   . Metformin And Related Diarrhea  . Morphine Hives  . Augmentin [Amoxicillin-Pot Clavulanate] Rash    To bilat post hands only    Family History  Problem Relation Age of Onset  . Heart disease Mother   . Hypertension Mother   . Diabetes Mother   . Stroke Father 50  . Diabetes Father     Prior to Admission medications   Medication Sig Start Date End Date Taking? Authorizing Provider  albuterol (PROAIR HFA) 108 (90 BASE) MCG/ACT inhaler Inhale 2 puffs into the lungs every 4 (four) hours as needed for wheezing or shortness of breath. 04/18/15  Yes Rowe Clack, MD  albuterol (PROVENTIL) (2.5 MG/3ML) 0.083% nebulizer solution Take 2.5 mg by nebulization every 6 (six) hours as needed for wheezing or shortness of breath.   Yes [provider]  blood glucose meter kit and supplies Dispense based on patient and insurance preference. Use up to four times daily as directed. (FOR ICD-9 250.00, 250.01). 06/03/14  Yes Rowe Clack, MD  diphenhydrAMINE (BENADRYL) 25 mg capsule Take 1 capsule (25 mg total) by mouth every 6 (  six) hours as needed. Patient taking differently: Take 25 mg by mouth every 6 (six) hours as needed for itching or allergies.  07/13/16  Yes Shary Decamp, PA-C  fexofenadine (ALLEGRA) 180 MG tablet Take 180 mg by mouth daily as needed. For seasonal allergies   Yes [provider]  glipiZIDE (GLUCOTROL) 5 MG tablet TAKE 1 TABLET(5 MG) BY MOUTH TWICE DAILY BEFORE A MEAL 05/15/17  Yes Hoyt Koch, MD  glucose blood (ONE TOUCH ULTRA TEST) test strip Use as directed 10/28/13  Yes Rowe Clack, MD  losartan  (COZAAR) 100 MG tablet TAKE 1 TABLET BY MOUTH DAILY 06/02/17  Yes Hoyt Koch, MD  mometasone (NASONEX) 50 MCG/ACT nasal spray Place 2 sprays into the nose daily as needed. For nasal spray   Yes [provider]  norethindrone (AYGESTIN) 5 MG tablet Take 5 mg by mouth daily.  07/06/13  Yes [provider]  TRULICITY 1.5 VQ/9.4HW SOPN INJECT 1.5 MG INTO THE SKIN ONCE A WEEK 12/24/16  Yes Hoyt Koch, MD  benzonatate (TESSALON) 200 MG capsule Take 1 capsule (200 mg total) by mouth 3 (three) times daily as needed. Swallow whole-do not bite pill Patient not taking: Reported on 06/30/2017 04/19/17   Tower, Wynelle Fanny, MD  dextromethorphan-guaiFENesin [facility] DM) 30-600 MG 12hr tablet Take 1 tablet by mouth 2 (two) times daily as needed for cough. Patient not taking: Reported on 06/30/2017 02/18/17   Nche, Charlene Brooke, NP  doxycycline (VIBRA-TABS) 100 MG tablet Take 1 tablet (100 mg total) by mouth 2 (two) times daily. Patient not taking: Reported on 06/30/2017 04/24/17   Biagio Borg, MD  empagliflozin (JARDIANCE) 25 MG TABS tablet Take 25 mg by mouth daily. Patient not taking: Reported on 06/30/2017 04/24/17   Biagio Borg, MD  fluticasone [facility]) 50 MCG/ACT nasal spray Place 2 sprays into both nostrils daily. Patient not taking: Reported on 06/30/2017 09/23/16   Delano Metz, FNP  Fluticasone-Salmeterol (ADVAIR DISKUS) 250-50 MCG/DOSE AEPB Inhale 1 puff into the lungs 2 (two) times daily. Patient not taking: Reported on 06/30/2017 07/22/11   Rowe Clack, MD  HYDROcodone-homatropine [facility]) 5-1.5 MG/5ML syrup Take 5 mLs by mouth every 6 (six) hours as needed for cough. Patient not taking: Reported on 06/30/2017 02/18/17   Nche, Charlene Brooke, NP  olopatadine (PATANOL) 0.1 % ophthalmic solution Place 1 drop into both eyes 2 (two) times daily. Patient not taking: Reported on 06/30/2017 08/08/12   Darlyne Russian, MD  sodium chloride (OCEAN) 0.65 % SOLN nasal spray  Place 1 spray into both nostrils as needed for congestion. Patient not taking: Reported on 06/30/2017 02/18/17   Flossie Buffy, NP    Physical Exam: Vitals:   07/01/17 0145 07/01/17 0200 07/01/17 0215 07/01/17 0259  BP: (!) 167/120 (!) 188/103 (!) 200/98 (!) 208/82  Pulse: 82 69 82 80  Resp: 16 (!) 21 (!) 21 (!) 21  Temp:      TempSrc:      SpO2: 100% 100% 100% 100%  Weight:      Height:          Constitutional: Moderately built and nourished. Vitals:   07/01/17 0145 07/01/17 0200 07/01/17 0215 07/01/17 0259  BP: (!) 167/120 (!) 188/103 (!) 200/98 (!) 208/82  Pulse: 82 69 82 80  Resp: 16 (!) 21 (!) 21 (!) 21  Temp:      TempSrc:      SpO2: 100% 100% 100% 100%  Weight:  Height:       Eyes: Anicteric no pallor. ENMT: No discharge from the ears eyes nose or mouth. Neck: No neck rigidity no mass felt.  No JVD appreciated. Respiratory: No rhonchi or crepitations. Cardiovascular: S1-S2 heard no murmurs appreciated. Abdomen: Soft nontender bowel sounds present. Musculoskeletal: No edema.  No joint effusion. Skin: No rash.  Skin appears warm. Neurologic: Alert awake oriented to time place and person.  Moves all extremities 5 x 5.  No facial asymmetry tongue is midline.  Pupils are equal and reacting to light. Psychiatric: Appears normal.  Normal affect.   Labs on Admission: I have personally reviewed following labs and imaging studies  CBC: Recent Labs  Lab 06/30/17 2325 06/30/17 2331  WBC 9.3  --   NEUTROABS 6.2  --   HGB 14.3 15.6*  HCT 44.4 46.0  MCV 84.4  --   PLT 325  --    Basic Metabolic Panel: Recent Labs  Lab 06/30/17 2325 06/30/17 2331  NA 140 142  K 3.5 3.5  CL 107 106  CO2 25  --   GLUCOSE 206* 208*  BUN 6 4*  CREATININE 0.91 0.90  CALCIUM 8.7*  --   MG 1.8  --    GFR: Estimated Creatinine Clearance: 124.3 mL/min (by C-G formula based on SCr of 0.9 mg/dL). Liver Function Tests: Recent Labs  Lab 06/30/17 2325  AST 16  ALT 18    ALKPHOS 60  BILITOT 0.4  PROT 7.1  ALBUMIN 3.6   No results for input(s): LIPASE, AMYLASE in the last 168 hours. No results for input(s): AMMONIA in the last 168 hours. Coagulation Profile: Recent Labs  Lab 06/30/17 2325  INR 1.06   Cardiac Enzymes: No results for input(s): CKTOTAL, CKMB, CKMBINDEX, TROPONINI in the last 168 hours. BNP (last 3 results) No results for input(s): PROBNP in the last 8760 hours. HbA1C: No results for input(s): HGBA1C in the last 72 hours. CBG: No results for input(s): GLUCAP in the last 168 hours. Lipid Profile: No results for input(s): CHOL, HDL, LDLCALC, TRIG, CHOLHDL, LDLDIRECT in the last 72 hours. Thyroid Function Tests: No results for input(s): TSH, T4TOTAL, FREET4, T3FREE, THYROIDAB in the last 72 hours. Anemia Panel: No results for input(s): VITAMINB12, FOLATE, FERRITIN, TIBC, IRON, RETICCTPCT in the last 72 hours. Urine analysis:    Component Value Date/Time   COLORURINE YELLOW 04/20/2016 0305   APPEARANCEUR CLEAR 04/20/2016 0305   LABSPEC 1.024 04/20/2016 0305   PHURINE 5.0 04/20/2016 0305   GLUCOSEU 150 (A) 04/20/2016 0305   GLUCOSEU NEGATIVE 04/05/2013 1003   HGBUR SMALL (A) 04/20/2016 0305   BILIRUBINUR NEGATIVE 04/20/2016 0305   KETONESUR 20 (A) 04/20/2016 0305   PROTEINUR NEGATIVE 04/20/2016 0305   UROBILINOGEN 0.2 02/10/2015 0751   NITRITE NEGATIVE 04/20/2016 0305   LEUKOCYTESUR MODERATE (A) 04/20/2016 0305   Sepsis Labs: '@LABRCNTIP' (procalcitonin:4,lacticidven:4) )No results found for this or any previous visit (from the past 240 hour(s)).   Radiological Exams on Admission: Ct Angio Head W Or Wo Contrast  Result Date: 07/01/2017 CLINICAL DATA:  42 y/o F; altered mental status, evaluation for stroke. EXAM: CT ANGIOGRAPHY HEAD AND NECK TECHNIQUE: Multidetector CT imaging of the head and neck was performed using the standard protocol during bolus administration of intravenous contrast. Multiplanar CT image reconstructions  and MIPs were obtained to evaluate the vascular anatomy. Carotid stenosis measurements (when applicable) are obtained utilizing NASCET criteria, using the distal internal carotid diameter as the denominator. CONTRAST:  151m ISOVUE-370 IOPAMIDOL (ISOVUE-370) INJECTION  76% COMPARISON:  06/30/2017 CT head. FINDINGS: CTA NECK FINDINGS Aortic arch: Standard branching. Imaged portion shows no evidence of aneurysm or dissection. No significant stenosis of the major arch vessel origins. Right carotid system: No evidence of dissection, stenosis (50% or greater) or occlusion. Left carotid system: No evidence of dissection, stenosis (50% or greater) or occlusion. Vertebral arteries: Codominant. No evidence of dissection, stenosis (50% or greater) or occlusion. Skeleton: Negative. Other neck: Right floor of mouth fat containing structure measuring 14 mm, probably a small lipoma, partially obscured by streak artifact from dental hardware (series 5, image 57). Upper chest: Negative. Review of the MIP images confirms the above findings CTA HEAD FINDINGS Anterior circulation: No large vessel occlusion, aneurysm, or vascular malformation. Posterior circulation: No large vessel occlusion, aneurysm, or vascular malformation. Multiple segments of stenosis are present in the anterior and posterior circulation best appreciated in the right superior M2, left inferior M2, right P1, left P2 segments. Venous sinuses: As permitted by contrast timing, patent. Anatomic variants: Diminutive right and small left posterior communicating arteries. No anterior communicating artery identified. Delayed phase: No abnormal intracranial enhancement. Review of the MIP images confirms the above findings IMPRESSION: 1. Multiple segments of arterial stenosis in the anterior and posterior circulation which may represent cerebral vasculitis or possibly reversible cerebral vasoconstriction syndrome (RCVS)/PRES given posterior distribution cortical abnormality.  2. No large vessel occlusion, aneurysm, or vascular malformation. 3. Widely patent carotid and vertebral arteries in the neck. These results were called by telephone at the time of interpretation on 06/30/2017 at 11:58 pm to Dr. Delora Fuel , who verbally acknowledged these results. Electronically Signed   By: Kristine Garbe M.D.   On: 07/01/2017 00:05   Ct Angio Neck W Or Wo Contrast  Result Date: 07/01/2017 CLINICAL DATA:  42 y/o F; altered mental status, evaluation for stroke. EXAM: CT ANGIOGRAPHY HEAD AND NECK TECHNIQUE: Multidetector CT imaging of the head and neck was performed using the standard protocol during bolus administration of intravenous contrast. Multiplanar CT image reconstructions and MIPs were obtained to evaluate the vascular anatomy. Carotid stenosis measurements (when applicable) are obtained utilizing NASCET criteria, using the distal internal carotid diameter as the denominator. CONTRAST:  180m ISOVUE-370 IOPAMIDOL (ISOVUE-370) INJECTION 76% COMPARISON:  06/30/2017 CT head. FINDINGS: CTA NECK FINDINGS Aortic arch: Standard branching. Imaged portion shows no evidence of aneurysm or dissection. No significant stenosis of the major arch vessel origins. Right carotid system: No evidence of dissection, stenosis (50% or greater) or occlusion. Left carotid system: No evidence of dissection, stenosis (50% or greater) or occlusion. Vertebral arteries: Codominant. No evidence of dissection, stenosis (50% or greater) or occlusion. Skeleton: Negative. Other neck: Right floor of mouth fat containing structure measuring 14 mm, probably a small lipoma, partially obscured by streak artifact from dental hardware (series 5, image 57). Upper chest: Negative. Review of the MIP images confirms the above findings CTA HEAD FINDINGS Anterior circulation: No large vessel occlusion, aneurysm, or vascular malformation. Posterior circulation: No large vessel occlusion, aneurysm, or vascular malformation.  Multiple segments of stenosis are present in the anterior and posterior circulation best appreciated in the right superior M2, left inferior M2, right P1, left P2 segments. Venous sinuses: As permitted by contrast timing, patent. Anatomic variants: Diminutive right and small left posterior communicating arteries. No anterior communicating artery identified. Delayed phase: No abnormal intracranial enhancement. Review of the MIP images confirms the above findings IMPRESSION: 1. Multiple segments of arterial stenosis in the anterior and posterior circulation which may represent  cerebral vasculitis or possibly reversible cerebral vasoconstriction syndrome (RCVS)/PRES given posterior distribution cortical abnormality. 2. No large vessel occlusion, aneurysm, or vascular malformation. 3. Widely patent carotid and vertebral arteries in the neck. These results were called by telephone at the time of interpretation on 06/30/2017 at 11:58 pm to Dr. Delora Fuel , who verbally acknowledged these results. Electronically Signed   By: Kristine Garbe M.D.   On: 07/01/2017 00:05   Ct Head Code Stroke Wo Contrast  Result Date: 06/30/2017 CLINICAL DATA:  Code stroke. 42 y/o F; altered mental status, code stroke. EXAM: CT HEAD WITHOUT CONTRAST TECHNIQUE: Contiguous axial images were obtained from the base of the skull through the vertex without intravenous contrast. COMPARISON:  None. FINDINGS: Brain: Small 14 mm wedge-shaped focus of hypoattenuation within the left parieto-occipital junction (series 2, image 18) which may represent an area of acute or subacute infarction. No hemorrhage or mass effect. No additional area of stroke, hemorrhage, or mass effect identified. No hydrocephalus, effacement of basilar cisterns, or extra-axial collection. Vascular: No hyperdense vessel or unexpected calcification. Skull: Normal. Negative for fracture or focal lesion. Sinuses/Orbits: No acute finding. Other: None. ASPECTS [facility]  Stroke Program Early CT Score) - Ganglionic level infarction (caudate, lentiform nuclei, internal capsule, insula, M1-M3 cortex): 7 - Supraganglionic infarction (M4-M6 cortex): 3 Total score (0-10 with 10 being normal): 10 IMPRESSION: 1. Small 14 mm cortical focus of hypoattenuation within left parieto-occipital junction which may represent acute or subacute infarction. No hemorrhage or mass effect. 2. ASPECTS is 10 These results were called by telephone at the time of interpretation on 06/30/2017 at 11:26 pm to Dr. Delora Fuel , who verbally acknowledged these results. Electronically Signed   By: Kristine Garbe M.D.   On: 06/30/2017 23:29    EKG: Independently reviewed.  Normal sinus rhythm.  Assessment/Plan Principal Problem:   TIA (transient ischemic attack) Active Problems:   Diabetes mellitus type 2, uncontrolled ([place])   Asthma   Hypertensive urgency   Hypertensive encephalopathy    1. Hypertensive encephalopathy/PRES/RCVS/TIA/cerebral vasculitis-MRI of the brain and 2D echo has been ordered.  Neurology has been consulted for further recommendations.  Further recommendation based on patient's clinical course and test ordered.  Would also check a hemoglobin A1c lipid panel and patient will be placed on neurochecks.  Patient passed swallow.  On aspirin. 2. Hypertensive urgency -will allow for permissive hypertension until stroke ruled out.  Patient is on losartan which should be continued.  PRN IV hydralazine for systolic blood pressure more than 210 for now. 3. Headache could be from hypertension uncontrolled versus migraine.  Await neurology recommendations.  Headache resolved at this time. 4. Diabetes mellitus type 2 -continue Trulicity and keep patient on sliding scale coverage for now and hold oral hypoglycemics. 5. History of asthma not presently wheezing.   DVT prophylaxis: SCDs. Code Status: Full code. Family Communication: Family at the bedside. Disposition Plan:  Home. Consults called: Neurology. Admission status: Observation.   Rise Patience MD [facility] Pager [phone].  If 7PM-7AM, please contact night-coverage [email or web address] Password [facility]  07/01/2017, 4:27 AM

## 2017-07-01 NOTE — ED Notes (Signed)
Bed: [id number] Expected date:  Expected time:  Means of arrival:  Comments: Hold for 3

## 2017-07-01 NOTE — Consult Note (Signed)
    Date:07/01/17 [place]  [facility] TeleNeurology Consult Services STAT  Impression L medial occipital lobe infarct  Recommendations: bp control  admit stroke/telemetry neuro checks dvt prophy dysphagia screen asa and statin if no contraindications head of bed flat iv fluids , ns tsh, homocysteine, b12 mri/a brain w/o contrast CD TTE if neg will need TEE hypercoaguable w/u inpt neurology consult pt/ot/st   ---------------------------------------------------------------------  CC: STAT consult   History of Present Illness:  42yo W w hx of htn, dmt2 who presents today following episode of difficulty speaking and thought processing and R index and thumb numbness yesterday associated with a headache.  All symptoms resolved today. BP 216/126 --> 194/91.    Diagnostic Testing: 168/27/129/60 a1c 8.5 hct IMPRESSION: Stable infarct in the medial left occipital lobe. No new area of decreased attenuation evident by noncontrast enhanced CT. Note the noncontrast enhanced CT from 1 day prior as well as the current examination demonstrates less extensive infarct than is demonstrated on MR obtained earlier in the day. No hemorrhage or mass evident. Vital Signs:    Exam:  Mental Status:  Awake, alert, oriented  Naming: Intact Repetition: Intact   Speech: fluent  Cranial Nerves:  Pupils: Equal round and reactive to light Extraocular movements: Intact in all cardinal gaze Ptosis: Absent Visual fields: Intact to finger counting Facial sensation: Intact to pin and light touch Facial movements: Intact and symmetric    Motor Exam:  No drift   Tremor/Abnormal Movements:  Resting tremor: Absent Intention tremor: Absent Postural tremor: Absent  Sensory Exam:   Light touch: Intact     Coordination:   Finger to nose: Intact  [id number]  Medical Decision Making:  - Extensive number of diagnosis or management options are considered above.   - Extensive amount of  complex data reviewed.   - High risk of complication and/or morbidity or mortality are associated with differential diagnostic considerations above.  - There may be uncertain outcome and increased probability of prolonged functional impairment or high probability of severe prolonged functional impairment associated with some of these differential diagnosis.   Medical Data Reviewed:  1.Data reviewed include clinical labs, radiology,  Medical Tests;   2.Tests results discussed w/performing or interpreting physician;   3.Obtaining/reviewing old medical records;  4.Obtaining case history from another source;  5.Independent review of image, tracing or specimen.    Patient was informed the Neurology Consult would happen via telehealth (remote video) and consented to receiving care in this manner.      

## 2017-07-01 NOTE — Progress Notes (Signed)
SLP Cancellation Note  Patient Details Name: Debra Droughtiffany L Lasch MRN: [id number] DOB: 06/04/1975   Cancelled treatment:       Reason Eval/Treat Not Completed: Medical issues which prohibited therapy. SLP arrived to complete cog/com evaluation in ED, however, RN reports sudden onset of new neurological symptoms. STAT CT pending. Will recheck next date for appropriateness to proceed with evaluation.  Jakai Risse B. Murvin NatalBueche, [facility] Iowa Healthcare SystemMSP, CCC-SLP Speech Language Pathologist [phone]  Leigh AuroraBueche, [place] Thiam Brown 07/01/2017, 2:17 PM

## 2017-07-01 NOTE — ED Notes (Signed)
Patient called out to RN that "something wasn't right" and her "mouth was twisted". On assessement patient with slurred speech and a left side droop and asymmetric smile. Hospitalist paged. Charge RN and Dr. Eudelia Bunchardama to bedside. MD consulted with hospitalist. Tele neuro to bedside. CT scan ordered.

## 2017-07-01 NOTE — ED Notes (Signed)
ED TO INPATIENT HANDOFF REPORT  Name/Age/Gender Debra [name] 42 y.o. female  Code Status    Code Status Orders  (From admission, onward)        Start     Ordered   07/01/17 0424  Full code  Continuous     07/01/17 0426    Code Status History    Date Active Date Inactive Code Status Order ID Comments User Context   This patient has a current code status but no historical code status.      Home/SNF/Other Home  Chief Complaint AMS, Psych eval, fingers numb  Level of Care/Admitting Diagnosis ED Disposition    ED Disposition Condition [facility] Area: [place] [[id number]]  Level of Care: Stepdown [14]  Diagnosis: Acute embolic stroke [facility]) [[id number]]  Admitting Physician: Marene Lenz [[id number]]  Attending Physician: Jodie Echevaria, AMRIT [[id number]]  Estimated length of stay: 3 - 4 days  Certification:: I certify this patient will need inpatient services for at least 2 midnights  PT Class (Do Not Modify): Inpatient [101]  PT Acc Code (Do Not Modify): Private [1]       Medical History Past Medical History:  Diagnosis Date  . Allergic rhinitis, cause unspecified   . Asthma   . Diabetes mellitus type II 08/2009 dx  . Endometriosis   . Hypertension   . Knee pain, right    DJD, post traumatic with repeat falls  . Morbid obesity ([place])    s/p lab band 09/2010  . Ovarian cyst    right s/p resection June 2013    Allergies Allergies  Allergen Reactions  . Influenza Vaccines   . Metformin And Related Diarrhea  . Morphine Hives  . Augmentin [Amoxicillin-Pot Clavulanate] Rash    To bilat post hands only    IV Location/Drains/Wounds Patient Lines/Drains/Airways Status   Active Line/Drains/Airways    Name:   Placement date:   Placement time:   Site:   Days:   Peripheral IV 06/30/17 Right Antecubital   06/30/17    2326    Antecubital   1          Labs/Imaging Results for orders placed or performed during the hospital  encounter of 06/30/17 (from the past 48 hour(s))  Protime-INR     Status: None   Collection Time: 06/30/17 11:25 PM  Result Value Ref Range   Prothrombin Time 13.7 11.4 - 15.2 seconds   INR 1.06     Comment: Performed at [facility], [place] [address] [place], [place] [address]  APTT     Status: None   Collection Time: 06/30/17 11:25 PM  Result Value Ref Range   aPTT 27 24 - 36 seconds    Comment: Performed at [facility], [place] [address] [place], [place] [address]  CBC     Status: Abnormal   Collection Time: 06/30/17 11:25 PM  Result Value Ref Range   WBC 9.3 4.0 - 10.5 K/uL   RBC 5.26 (H) 3.87 - 5.11 MIL/uL   Hemoglobin 14.3 12.0 - 15.0 g/dL   HCT 44.4 36.0 - 46.0 %   MCV 84.4 78.0 - 100.0 fL   MCH 27.2 26.0 - 34.0 pg   MCHC 32.2 30.0 - 36.0 g/dL   RDW 13.0 11.5 - 15.5 %   Platelets 325 150 - 400 K/uL    Comment: Performed at [facility], [place] [address], [place], [place] [address]  Differential  Status: None   Collection Time: 06/30/17 11:25 PM  Result Value Ref Range   Neutrophils Relative % 68 %   Neutro Abs 6.2 1.7 - 7.7 K/uL   Lymphocytes Relative 26 %   Lymphs Abs 2.4 0.7 - 4.0 K/uL   Monocytes Relative 4 %   Monocytes Absolute 0.4 0.1 - 1.0 K/uL   Eosinophils Relative 2 %   Eosinophils Absolute 0.2 0.0 - 0.7 K/uL   Basophils Relative 0 %   Basophils Absolute 0.0 0.0 - 0.1 K/uL    Comment: Performed at [facility], [place] [address], [place], [place] [address]  Comprehensive metabolic panel     Status: Abnormal   Collection Time: 06/30/17 11:25 PM  Result Value Ref Range   Sodium 140 135 - 145 mmol/L   Potassium 3.5 3.5 - 5.1 mmol/L   Chloride 107 101 - 111 mmol/L   CO2 25 22 - 32 mmol/L   Glucose, Bld 206 (H) 65 - 99 mg/dL   BUN 6 6 - 20 mg/dL   Creatinine, Ser 0.91 0.44 - 1.00 mg/dL   Calcium 8.7 (L) 8.9 - 10.3 mg/dL   Total Protein 7.1 6.5 - 8.1 g/dL   Albumin 3.6 3.5 - 5.0 g/dL    AST 16 15 - 41 U/L   ALT 18 14 - 54 U/L   Alkaline Phosphatase 60 38 - 126 U/L   Total Bilirubin 0.4 0.3 - 1.2 mg/dL   GFR calc non Af Amer >60 >60 mL/min   GFR calc Af Amer >60 >60 mL/min    Comment: (NOTE) The eGFR has been calculated using the CKD EPI equation. This calculation has not been validated in all clinical situations. eGFR's persistently <60 mL/min signify possible Chronic Kidney Disease.    Anion gap 8 5 - 15    Comment: Performed at [facility], [place] [address] [place], [place] [address]  Magnesium     Status: None   Collection Time: 06/30/17 11:25 PM  Result Value Ref Range   Magnesium 1.8 1.7 - 2.4 mg/dL    Comment: Performed at [facility], [place] [address], [place],  [address]  I-stat troponin, ED     Status: None   Collection Time: 06/30/17 11:29 PM  Result Value Ref Range   Troponin i, poc 0.00 0.00 - 0.08 ng/mL   Comment 3            Comment: Due to the release kinetics of cTnI, a negative result within the first hours of the onset of symptoms does not rule out myocardial infarction with certainty. If myocardial infarction is still suspected, repeat the test at appropriate intervals.   I-Stat beta hCG blood, ED     Status: None   Collection Time: 06/30/17 11:29 PM  Result Value Ref Range   I-stat hCG, quantitative <5.0 <5 mIU/mL   Comment 3            Comment:   GEST. AGE      CONC.  (mIU/mL)   <=1 WEEK        5 - 50     2 WEEKS       50 - 500     3 WEEKS       100 - 10,000     4 WEEKS     1,000 - 30,000        FEMALE AND NON-PREGNANT FEMALE:     LESS THAN 5 mIU/mL   I-Stat  Chem 8, ED     Status: Abnormal   Collection Time: 06/30/17 11:31 PM  Result Value Ref Range   Sodium 142 135 - 145 mmol/L   Potassium 3.5 3.5 - 5.1 mmol/L   Chloride 106 101 - 111 mmol/L   BUN 4 (L) 6 - 20 mg/dL   Creatinine, Ser 0.90 0.44 - 1.00 mg/dL   Glucose, Bld 208 (H) 65 - 99 mg/dL   Calcium, Ion 1.11 (L) 1.15 - 1.40  mmol/L   TCO2 24 22 - 32 mmol/L   Hemoglobin 15.6 (H) 12.0 - 15.0 g/dL   HCT 46.0 36.0 - 46.0 %  HIV antibody (Routine Testing)     Status: None   Collection Time: 07/01/17  8:00 AM  Result Value Ref Range   HIV Screen 4th Generation wRfx Non Reactive Non Reactive    Comment: (NOTE) Performed At: [facility] [place], [place] [id number] Rush Farmer MD [id number] Performed at [facility], [place] [address] [place], [place] [address]   Hemoglobin A1c     Status: Abnormal   Collection Time: 07/01/17  8:00 AM  Result Value Ref Range   Hgb A1c MFr Bld 8.5 (H) 4.8 - 5.6 %    Comment: (NOTE) Pre diabetes:          5.7%-6.4% Diabetes:              >6.4% Glycemic control for   <7.0% adults with diabetes    Mean Plasma Glucose 197.25 mg/dL    Comment: Performed at [place] [address], [place], [place] [address]  Lipid panel     Status: Abnormal   Collection Time: 07/01/17  8:00 AM  Result Value Ref Range   Cholesterol 168 0 - 200 mg/dL   Triglycerides 60 <150 mg/dL   HDL 27 (L) >40 mg/dL   Total CHOL/HDL Ratio 6.2 RATIO   VLDL 12 0 - 40 mg/dL   LDL Cholesterol 129 (H) 0 - 99 mg/dL    Comment:        Total Cholesterol/HDL:CHD Risk Coronary Heart Disease Risk Table                     Men   Women  1/2 Average Risk   3.4   3.3  Average Risk       5.0   4.4  2 X Average Risk   9.6   7.1  3 X Average Risk  23.4   11.0        Use the calculated Patient Ratio above and the CHD Risk Table to determine the patient's CHD Risk.        ATP III CLASSIFICATION (LDL):  <100     mg/dL   Optimal  100-129  mg/dL   Near or Above                    Optimal  130-159  mg/dL   Borderline  160-189  mg/dL   High  >190     mg/dL   Very High Performed at [place] [address], [place], [place] [address]   CBG monitoring, ED     Status: Abnormal   Collection Time: 07/01/17  8:19 AM  Result Value Ref Range    Glucose-Capillary 230 (H) 65 - 99 mg/dL  CBG monitoring, ED     Status: Abnormal   Collection Time: 07/01/17 12:17 PM  Result Value Ref  Range   Glucose-Capillary 197 (H) 65 - 99 mg/dL  Antithrombin III     Status: None   Collection Time: 07/01/17  3:08 PM  Result Value Ref Range   AntiThromb III Func 85 75 - 120 %    Comment: Performed at [place] [address], [place], [place] [address]  Sedimentation rate     Status: None   Collection Time: 07/01/17  3:08 PM  Result Value Ref Range   Sed Rate 13 0 - 22 mm/hr    Comment: Performed at [facility], [place] [address], [place], [place] [address]  CBG monitoring, ED     Status: Abnormal   Collection Time: 07/01/17  6:14 PM  Result Value Ref Range   Glucose-Capillary 190 (H) 65 - 99 mg/dL   Ct Angio Head W Or Wo Contrast  Result Date: 07/01/2017 CLINICAL DATA:  42 y/o F; altered mental status, evaluation for stroke. EXAM: CT ANGIOGRAPHY HEAD AND NECK TECHNIQUE: Multidetector CT imaging of the head and neck was performed using the standard protocol during bolus administration of intravenous contrast. Multiplanar CT image reconstructions and MIPs were obtained to evaluate the vascular anatomy. Carotid stenosis measurements (when applicable) are obtained utilizing NASCET criteria, using the distal internal carotid diameter as the denominator. CONTRAST:  129m ISOVUE-370 IOPAMIDOL (ISOVUE-370) INJECTION 76% COMPARISON:  06/30/2017 CT head. FINDINGS: CTA NECK FINDINGS Aortic arch: Standard branching. Imaged portion shows no evidence of aneurysm or dissection. No significant stenosis of the major arch vessel origins. Right carotid system: No evidence of dissection, stenosis (50% or greater) or occlusion. Left carotid system: No evidence of dissection, stenosis (50% or greater) or occlusion. Vertebral arteries: Codominant. No evidence of dissection, stenosis (50% or greater) or occlusion. Skeleton: Negative. Other neck: Right  floor of mouth fat containing structure measuring 14 mm, probably a small lipoma, partially obscured by streak artifact from dental hardware (series 5, image 57). Upper chest: Negative. Review of the MIP images confirms the above findings CTA HEAD FINDINGS Anterior circulation: No large vessel occlusion, aneurysm, or vascular malformation. Posterior circulation: No large vessel occlusion, aneurysm, or vascular malformation. Multiple segments of stenosis are present in the anterior and posterior circulation best appreciated in the right superior M2, left inferior M2, right P1, left P2 segments. Venous sinuses: As permitted by contrast timing, patent. Anatomic variants: Diminutive right and small left posterior communicating arteries. No anterior communicating artery identified. Delayed phase: No abnormal intracranial enhancement. Review of the MIP images confirms the above findings IMPRESSION: 1. Multiple segments of arterial stenosis in the anterior and posterior circulation which may represent cerebral vasculitis or possibly reversible cerebral vasoconstriction syndrome (RCVS)/PRES given posterior distribution cortical abnormality. 2. No large vessel occlusion, aneurysm, or vascular malformation. 3. Widely patent carotid and vertebral arteries in the neck. These results were called by telephone at the time of interpretation on 06/30/2017 at 11:58 pm to Dr. DDelora Fuel, who verbally acknowledged these results. Electronically Signed   By: LKristine GarbeM.D.   On: 07/01/2017 00:05   Ct Head Wo Contrast  Result Date: 07/01/2017 CLINICAL DATA:  Transient slurred speech EXAM: CT HEAD WITHOUT CONTRAST TECHNIQUE: Contiguous axial images were obtained from the base of the skull through the vertex without intravenous contrast. COMPARISON:  Head CT June 30, 2017 and brain MRI July 01, 2017 FINDINGS: Brain: There is a stable area of decreased attenuation in the medial left occipital lobe consistent with recent  infarct in this area, confirmed on diffusion  MR imaging. By CT, no new focus of decreased attenuation evident. No evident mass, hemorrhage, extra-axial fluid collection, or midline shift. Vascular: No hyperdense vessel evident. No vascular calcification evident. Skull: Bony calvarium appears intact. Sinuses/Orbits: Visualized paranasal sinuses are clear. Visualized orbits appear symmetric bilaterally. Other: Mastoid air cells are clear. IMPRESSION: Stable infarct in the medial left occipital lobe. No new area of decreased attenuation evident by noncontrast enhanced CT. Note the noncontrast enhanced CT from 1 day prior as well as the current examination demonstrates less extensive infarct than is demonstrated on MR obtained earlier in the day. No hemorrhage or mass evident. Electronically Signed   By: Lowella Grip III M.D.   On: 07/01/2017 14:16   Ct Angio Neck W Or Wo Contrast  Result Date: 07/01/2017 CLINICAL DATA:  42 y/o F; altered mental status, evaluation for stroke. EXAM: CT ANGIOGRAPHY HEAD AND NECK TECHNIQUE: Multidetector CT imaging of the head and neck was performed using the standard protocol during bolus administration of intravenous contrast. Multiplanar CT image reconstructions and MIPs were obtained to evaluate the vascular anatomy. Carotid stenosis measurements (when applicable) are obtained utilizing NASCET criteria, using the distal internal carotid diameter as the denominator. CONTRAST:  143m ISOVUE-370 IOPAMIDOL (ISOVUE-370) INJECTION 76% COMPARISON:  06/30/2017 CT head. FINDINGS: CTA NECK FINDINGS Aortic arch: Standard branching. Imaged portion shows no evidence of aneurysm or dissection. No significant stenosis of the major arch vessel origins. Right carotid system: No evidence of dissection, stenosis (50% or greater) or occlusion. Left carotid system: No evidence of dissection, stenosis (50% or greater) or occlusion. Vertebral arteries: Codominant. No evidence of dissection, stenosis  (50% or greater) or occlusion. Skeleton: Negative. Other neck: Right floor of mouth fat containing structure measuring 14 mm, probably a small lipoma, partially obscured by streak artifact from dental hardware (series 5, image 57). Upper chest: Negative. Review of the MIP images confirms the above findings CTA HEAD FINDINGS Anterior circulation: No large vessel occlusion, aneurysm, or vascular malformation. Posterior circulation: No large vessel occlusion, aneurysm, or vascular malformation. Multiple segments of stenosis are present in the anterior and posterior circulation best appreciated in the right superior M2, left inferior M2, right P1, left P2 segments. Venous sinuses: As permitted by contrast timing, patent. Anatomic variants: Diminutive right and small left posterior communicating arteries. No anterior communicating artery identified. Delayed phase: No abnormal intracranial enhancement. Review of the MIP images confirms the above findings IMPRESSION: 1. Multiple segments of arterial stenosis in the anterior and posterior circulation which may represent cerebral vasculitis or possibly reversible cerebral vasoconstriction syndrome (RCVS)/PRES given posterior distribution cortical abnormality. 2. No large vessel occlusion, aneurysm, or vascular malformation. 3. Widely patent carotid and vertebral arteries in the neck. These results were called by telephone at the time of interpretation on 06/30/2017 at 11:58 pm to Dr. DDelora Fuel, who verbally acknowledged these results. Electronically Signed   By: LKristine GarbeM.D.   On: 07/01/2017 00:05   Mr Brain Wo Contrast  Result Date: 07/01/2017 CLINICAL DATA:  Hypertension and diabetes. Acute presentation with headache and speech disturbance last night. EXAM: MRI HEAD WITHOUT CONTRAST TECHNIQUE: Multiplanar, multiecho pulse sequences of the brain and surrounding structures were obtained without intravenous contrast. COMPARISON:  CT studies 06/30/2017  FINDINGS: Brain: Diffusion imaging shows a cluster of numerous small acute infarctions in the left posterior temporal and temporoparietal junction region consistent with embolic disease in the left MCA territory. No other region of acute insult. No large confluent infarction. The brainstem and cerebellum are  normal. No mass effect or hemorrhage. The remainder the brain is negative. No mass, hydrocephalus or extra-axial collection. Vascular: Major vessels at the base of the brain show flow. Skull and upper cervical spine: Negative Sinuses/Orbits: Clear/normal Other: None IMPRESSION: Cluster of acute infarctions in the posterior left temporal lobe and temporoparietal junction region consistent with embolic infarctions in the left MCA territory. No other abnormal brain parenchymal finding. Whereas this could represent an extremely localized manifestation of posterior reversible encephalopathy, I think that is less likely. Electronically Signed   By: Nelson Chimes M.D.   On: 07/01/2017 07:36   Ct Head Code Stroke Wo Contrast  Result Date: 06/30/2017 CLINICAL DATA:  Code stroke. 42 y/o F; altered mental status, code stroke. EXAM: CT HEAD WITHOUT CONTRAST TECHNIQUE: Contiguous axial images were obtained from the base of the skull through the vertex without intravenous contrast. COMPARISON:  None. FINDINGS: Brain: Small 14 mm wedge-shaped focus of hypoattenuation within the left parieto-occipital junction (series 2, image 18) which may represent an area of acute or subacute infarction. No hemorrhage or mass effect. No additional area of stroke, hemorrhage, or mass effect identified. No hydrocephalus, effacement of basilar cisterns, or extra-axial collection. Vascular: No hyperdense vessel or unexpected calcification. Skull: Normal. Negative for fracture or focal lesion. Sinuses/Orbits: No acute finding. Other: None. ASPECTS [facility] Stroke Program Early CT Score) - Ganglionic level infarction (caudate, lentiform nuclei,  internal capsule, insula, M1-M3 cortex): 7 - Supraganglionic infarction (M4-M6 cortex): 3 Total score (0-10 with 10 being normal): 10 IMPRESSION: 1. Small 14 mm cortical focus of hypoattenuation within left parieto-occipital junction which may represent acute or subacute infarction. No hemorrhage or mass effect. 2. ASPECTS is 10 These results were called by telephone at the time of interpretation on 06/30/2017 at 11:26 pm to Dr. Delora Fuel , who verbally acknowledged these results. Electronically Signed   By: Kristine Garbe M.D.   On: 06/30/2017 23:29    Pending Labs Unresulted Labs (From admission, onward)   Start     Ordered   07/02/17 0500  CBC with Differential/Platelet  Tomorrow morning,   R     07/01/17 1439   07/02/17 9983  Basic metabolic panel  Tomorrow morning,   R     07/01/17 1439   07/01/17 1357  Rheumatoid factor  Once,   R     07/01/17 1356   07/01/17 1357  ANCA Titers  (Anti-Neutrophilic Cystoplasmic Antibody Panel (PNL))  Once,   R     07/01/17 1356   07/01/17 1357  Mpo/pr-3 (anca) antibodies  (Anti-Neutrophilic Cystoplasmic Antibody Panel (PNL))  Once,   R     07/01/17 1356   07/01/17 1355  C-reactive protein  Once,   R    Question:  Specimen collection method  Answer:  IV Team   07/01/17 1354   07/01/17 1355  Extractable Nuclear antigen ab  (Systemic Lupus Panel-Comprehesnive (PNL))  Once,   R    Question:  Specimen collection method  Answer:  IV Team   07/01/17 1354   07/01/17 1355  Anti-Jo 1 antibody, IgG  (Systemic Lupus Panel-Comprehesnive (PNL))  Once,   R    Question:  Specimen collection method  Answer:  IV Team   07/01/17 1354   07/01/17 1354  Protein C activity  (Hypercoagulable Panel, Comprehensive (PNL))  Once,   R     07/01/17 1353   07/01/17 1354  Protein C, total  (Hypercoagulable Panel, Comprehensive (PNL))  Once,   R  07/01/17 1353   07/01/17 1354  Protein S activity  (Hypercoagulable Panel, Comprehensive (PNL))  Once,   R     07/01/17  1353   07/01/17 1354  Protein S, total  (Hypercoagulable Panel, Comprehensive (PNL))  Once,   R     07/01/17 1353   07/01/17 1354  Lupus anticoagulant panel  (Hypercoagulable Panel, Comprehensive (PNL))  Once,   R     07/01/17 1353   07/01/17 1354  Beta-2-glycoprotein i abs, IgG/M/A  (Hypercoagulable Panel, Comprehensive (PNL))  Once,   R     07/01/17 1353   07/01/17 1354  Homocysteine, serum  (Hypercoagulable Panel, Comprehensive (PNL))  Once,   R     07/01/17 1353   07/01/17 1354  Factor 5 leiden  (Hypercoagulable Panel, Comprehensive (PNL))  Once,   R     07/01/17 1353   07/01/17 1354  Prothrombin gene mutation  (Hypercoagulable Panel, Comprehensive (PNL))  Once,   R     07/01/17 1353   07/01/17 1354  Cardiolipin antibodies, IgG, IgM, IgA  (Hypercoagulable Panel, Comprehensive (PNL))  Once,   R     07/01/17 1353      Vitals/Pain Today's Vitals   07/01/17 1700 07/01/17 1730 07/01/17 1800 07/01/17 1830  BP: (!) 166/89 (!) 175/96 (!) 177/98 (!) 187/102  Pulse: 76 73 76 81  Resp:  '20 20 20  ' Temp:      TempSrc:      SpO2: 100% 100% 98% 100%  Weight:      Height:      PainSc:        Isolation Precautions No active isolations  Medications Medications  albuterol (PROVENTIL) (2.5 MG/3ML) 0.083% nebulizer solution 2.5 mg (not administered)  loratadine (CLARITIN) tablet 10 mg (10 mg Oral Given 07/01/17 0916)  losartan (COZAAR) tablet 100 mg (100 mg Oral Given 07/01/17 0916)  acetaminophen (TYLENOL) tablet 650 mg (not administered)    Or  acetaminophen (TYLENOL) solution 650 mg (not administered)    Or  acetaminophen (TYLENOL) suppository 650 mg (not administered)  aspirin suppository 300 mg ( Rectal See Alternative 07/01/17 0916)    Or  aspirin tablet 325 mg (325 mg Oral Given 07/01/17 0916)  insulin aspart (novoLOG) injection 0-9 Units (2 Units Subcutaneous Given 07/01/17 1833)  LORazepam (ATIVAN) injection 1 mg (1 mg Intravenous Not Given 07/01/17 0835)  LORazepam (ATIVAN) 2  MG/ML injection (  Not Given 07/01/17 0834)  amLODipine (NORVASC) tablet 10 mg (10 mg Oral Given 07/01/17 1222)  insulin glargine (LANTUS) injection 10 Units (not administered)  methylPREDNISolone sodium succinate (SOLU-MEDROL) 1,000 mg in sodium chloride 0.9 % 50 mL IVPB (1,000 mg Intravenous Not Given 07/01/17 1549)  atorvastatin (LIPITOR) tablet 80 mg (not administered)  labetalol (NORMODYNE,TRANDATE) injection 20 mg (20 mg Intravenous Given 07/01/17 1456)  sodium chloride 0.9 % bolus 1,000 mL (0 mLs Intravenous Stopped 07/01/17 0836)  metoCLOPramide (REGLAN) injection 10 mg (10 mg Intravenous Given 07/01/17 0011)  diphenhydrAMINE (BENADRYL) injection 25 mg (25 mg Intravenous Given 07/01/17 0011)  sodium chloride 0.9 % injection (  Given by Other 07/01/17 0047)  iopamidol (ISOVUE-370) 76 % injection (100 mLs Intravenous Contrast Given 06/30/17 2325)  aspirin chewable tablet 324 mg (324 mg Oral Given 07/01/17 0051)  hydrALAZINE (APRESOLINE) injection 10 mg (10 mg Intravenous Given 07/01/17 0217)   stroke: mapping our early stages of recovery book ( Does not apply Given 07/01/17 0917)    Mobility walks

## 2017-07-01 NOTE — ED Notes (Signed)
Spoke with EDP about patient's decreased head pain and decreased paraesthesia in her right hand. No new orders at this time. EDP also aware of patient's increasing BP

## 2017-07-01 NOTE — Consult Note (Signed)
    Date:07/01/17 [place]  [facility] Tele[facility]urology Consult Services STAT  Impression L medial occipital lobe infarct  Recommendations: bp control  admit stroke/telemetry neuro checks dvt prophy dysphagia screen asa and statin if no contraindications head of bed flat iv fluids , ns tsh, homocysteine, b12 mri/a brain w/o contrast CD TTE if neg will need TEE hypercoaguable w/u inpt neurology consult pt/ot/st   ---------------------------------------------------------------------  CC: STAT consult   History of Present Illness:  42yo W w hx of htn, dmt2 who presents today following episode of difficulty speaking and thought processing and R index and thumb numbness yesterday associated with a headache.  All symptoms resolved today. BP 216/126 --> 194/91.    Diagnostic Testing: 168/27/129/60 a1c 8.5 hct IMPRESSION: Stable infarct in the medial left occipital lobe. No new area of decreased attenuation evident by noncontrast enhanced CT. Note the noncontrast enhanced CT from 1 day prior as well as the current examination demonstrates less extensive infarct than is demonstrated on MR obtained earlier in the day. No hemorrhage or mass evident. Vital Signs:    Exam:  Mental Status:  Awake, alert, oriented  Naming: Intact Repetition: Intact   Speech: fluent  Cranial Nerves:  Pupils: Equal round and reactive to light Extraocular movements: Intact in all cardinal gaze Ptosis: Absent Visual fields: Intact to finger counting Facial sensation: Intact to pin and light touch Facial movements: Intact and symmetric    Motor Exam:  No drift   Tremor/Abnormal Movements:  Resting tremor: Absent Intention tremor: Absent Postural tremor: Absent  Sensory Exam:   Light touch: Intact     Coordination:   Finger to nose: Intact  [id number]  Medical Decision Making:  - Extensive number of diagnosis or management options are considered above.   - Extensive amount of  complex data reviewed.   - High risk of complication and/or morbidity or mortality are associated with differential diagnostic considerations above.  - There may be uncertain outcome and increased probability of prolonged functional impairment or high probability of severe prolonged functional impairment associated with some of these differential diagnosis.   Medical Data Reviewed:  1.Data reviewed include clinical labs, radiology,  Medical Tests;   2.Tests results discussed w/performing or interpreting physician;   3.Obtaining/reviewing old medical records;  4.Obtaining case history from another source;  5.Independent review of image, tracing or specimen.    Patient was informed the Neurology Consult would happen via telehealth (remote video) and consented to receiving care in this manner.

## 2017-07-01 NOTE — ED Notes (Signed)
Pt transported to CT ?

## 2017-07-01 NOTE — ED Notes (Signed)
Tele neuro MD on screen. RN assisted with exam.

## 2017-07-01 NOTE — Progress Notes (Addendum)
Patient is a 42 year old female with past medical history of hypertension and diabetes type 2 who presented to the emergency department with complaints of headache, slurred speech.  Patient was found to be in hypertensive urgency on presentation. Patient was admitted overnight for management of acute ischemic stroke.  MRI brain:Diffusion imaging shows a cluster of numerous small acute infarctions in the left posterior temporal and temporoparietal junction region consistent with embolic disease in the left MCA territory. No other region of acute insult. No large confluent Infarction.  CT head/neck: Multiple segments of arterial stenosis in the anterior and posterior circulation which may represent cerebral vasculitis or possibly reversible cerebral vasoconstriction syndrome (RCVS)/PRES given posterior distribution cortical abnormality.No large vessel occlusion, aneurysm, or vascular malformation.  Neurology has already been consulted.  Echocardiogram has been ordered.  Patient will also be evaluated by PT/OT and speech. Patient was noted to be hypertensive during my evaluation.  We do not want to aggressively lower the blood pressure at this point.  I added amlodipine 10 mg.  She is on losartan 100 mg from home. Patient is alert and oriented during my evaluation.  She says she is feeling better.  She denies any headache during my evaluation.  She is able to go to the bathroom without any difficulty. She has been ordered steroids by neurology for possible vasculitis.  Hypercoagulable profile,vasculitis panel have been ordered. We will continue to monitor the patient.

## 2017-07-01 NOTE — Progress Notes (Signed)
Medical issues which prohibited therapy, noted BP remains high with diastolic > 100. Will check back another time when stable.  SnydertownLori Diyan Dave, [place] [phone]

## 2017-07-01 NOTE — ED Notes (Signed)
[facility] RN unable to take report, will call back.

## 2017-07-01 NOTE — Progress Notes (Addendum)
PT Cancellation Note  Patient Details Name: Debra [name] MRN: [id number] DOB: 03/01/1976   Cancelled Treatment:    Reason Eval/Treat Not Completed: Patient at procedure or test/unavailable, noted that patient has been ambulating, now in imaging. Will check back another time.   RN informed that patient had a new neurological change. Will check back tomorrow.  Debra [name] PT [phone]    Debra [name], Debra [name] 07/01/2017, 2:11 PM

## 2017-07-01 NOTE — Consult Note (Addendum)
Neurology Consultation Reason for Consult: Stroke VS PRES/Hypertensive Encephalopathy Referring Physician: Dr. Hal Hope  CC: Slurred speech, expressive aphasia  History is obtained from: patient  HPI: Debra [name] is a 42 y.o. female with HTN, T2DM, Endometriosis s/p hysterectomy/BSO, and seasonal allergies who presented after developing slurred speech, expressive aphasia, and slight numbness in her right 1st and 2nd fingers. She said her symptoms started around 10 pm on 06/30/16 while at church. She felt that she understood what others would say to her, but could not respond appropriately.   She knew what she wanted to say, but could not formulate the words verbally or spelling correctly via texting. She had associated right frontal headache and some increased weakness of her right leg compared to baseline. She says she presented to [facility] ED around 10:45 pm and her symptoms began to improve after given medications for HTN and headache. She feels near her baseline now. She denies any change in vision, chest pain, palpitations, dyspnea, or similar episodes in the past.   She does not take any antiplatelet medications or statins. She reports adherence to losartan for blood pressure. She reports a family history of stroke in her father.  In the ED an initial CT head showed hypoattenuation in the left parieto-occipital junction which may represent acute or subacute Infarction.   CTA head/neck was obtained which showed changes in the anterior and posterior circulation thought to represent cerebral vasculitis vs RCVS/PRES without large vessel occlusion, aneurysm, or AVM.   Subsequent MRI Brain WO contrast showed a cluster of acute infarctions in the posterior left temporal lobe and temporoparietal junction region consistent with embolic infarctions in the left MCA territory which the radiologist felt could be an extremely localized manifestation of PRES, but thought this was less likely.   LKW: 10  pm 06/30/17 tpa given?: no, NIH 0 Premorbid modified rankin scale: 0 ICH Score: 0, no ICH  NIHSS: 0   ROS: A 14 point ROS was performed and is negative except as noted in the HPI.  Past Medical History:  Diagnosis Date  . Allergic rhinitis, cause unspecified   . Asthma   . Diabetes mellitus type II 08/2009 dx  . Endometriosis   . Hypertension   . Knee pain, right    DJD, post traumatic with repeat falls  . Morbid obesity ([place])    s/p lab band 09/2010  . Ovarian cyst    right s/p resection June 2013    Family History  Problem Relation Age of Onset  . Heart disease Mother   . Hypertension Mother   . Diabetes Mother   . Stroke Father 3  . Diabetes Father    Social History:  reports that  has never smoked. she has never used smokeless tobacco. She reports that she does not drink alcohol or use drugs.  Allergies:  Allergies  Allergen Reactions  . Influenza Vaccines   . Metformin And Related Diarrhea  . Morphine Hives  . Augmentin [Amoxicillin-Pot Clavulanate] Rash    To bilat post hands only    Medications: I have reviewed the patient's current medications.  Exam: Current vital signs: BP (!) 194/91 (BP Location: Left Arm)   Pulse 82   Temp 98.4 F (36.9 C)   Resp 16   Ht 5' 8" (1.727 m)   Wt (!) 327 lb (148.3 kg)   LMP 07/28/2011   SpO2 100%   BMI 49.72 kg/m  Vital signs in last 24 hours: Temp:  [98.4 F (36.9 C)-98.6 F (  37 C)] 98.4 F (36.9 C) (03/12 0032) Pulse Rate:  [20-84] 82 (03/12 1419) Resp:  [12-24] 16 (03/12 1419) BP: (163-211)/(77-122) 194/91 (03/12 1419) SpO2:  [98 %-100 %] 100 % (03/12 1419) Weight:  [327 lb (148.3 kg)] 327 lb (148.3 kg) (03/11 2301)   Physical Exam  Constitutional: Appears well-developed and well-nourished, obese.  Psych: Affect appropriate to situation Eyes: No scleral injection HENT: No OP obstrucion Head: Normocephalic.  Cardiovascular: Normal rate and regular rhythm.  Respiratory: Effort normal and breath  sounds normal to anterior ascultation GI: Obese abdomen, Soft.  No distension. There is no tenderness.  Skin: WDI  Neuro: Mental Status: Patient is awake, alert, oriented to person, place, month, year, and situation. Patient is able to give a clear and coherent history. No signs of aphasia or neglect Cranial Nerves: II: Visual Fields are full. Pupils are equal, round, and reactive to light.   III,IV, VI: EOMI without ptosis or diploplia.  V: Facial sensation is symmetric to temperature and light touch VII: Facial movement is symmetric.  VIII: hearing is intact to voice X: Uvula elevates symmetrically XI: Shoulder shrug is symmetric. XII: tongue is midline without atrophy or fasciculations.  Motor: Tone is normal. Bulk is normal. 5/5 strength was present in all four extremities.  Sensory: Sensation is symmetric to light touch and temperature in the arms and legs. Deep Tendon Reflexes: Patellar reflexes diminished, symmetric in the biceps Plantars: Toes are downgoing bilaterally.  Cerebellar: FNF and HKS are intact bilaterally   I have reviewed labs in epic and the results pertinent to this consultation are: Results for orders placed or performed during the hospital encounter of 06/30/17 (from the past 48 hour(s))  Protime-INR     Status: None   Collection Time: 06/30/17 11:25 PM  Result Value Ref Range   Prothrombin Time 13.7 11.4 - 15.2 seconds   INR 1.06     Comment: Performed at [facility], [place] [address], [place], [place] [address]  APTT     Status: None   Collection Time: 06/30/17 11:25 PM  Result Value Ref Range   aPTT 27 24 - 36 seconds    Comment: Performed at [facility], [place] [address], [place], [place] [address]  CBC     Status: Abnormal   Collection Time: 06/30/17 11:25 PM  Result Value Ref Range   WBC 9.3 4.0 - 10.5 K/uL   RBC 5.26 (H) 3.87 - 5.11 MIL/uL   Hemoglobin 14.3 12.0 - 15.0 g/dL   HCT 44.4 36.0 - 46.0 %    MCV 84.4 78.0 - 100.0 fL   MCH 27.2 26.0 - 34.0 pg   MCHC 32.2 30.0 - 36.0 g/dL   RDW 13.0 11.5 - 15.5 %   Platelets 325 150 - 400 K/uL    Comment: Performed at [facility], [place] [address], [place], [place] [address]  Differential     Status: None   Collection Time: 06/30/17 11:25 PM  Result Value Ref Range   Neutrophils Relative % 68 %   Neutro Abs 6.2 1.7 - 7.7 K/uL   Lymphocytes Relative 26 %   Lymphs Abs 2.4 0.7 - 4.0 K/uL   Monocytes Relative 4 %   Monocytes Absolute 0.4 0.1 - 1.0 K/uL   Eosinophils Relative 2 %   Eosinophils Absolute 0.2 0.0 - 0.7 K/uL   Basophils Relative 0 %   Basophils Absolute 0.0 0.0 - 0.1 K/uL    Comment: Performed at [facility]  [place] [address], [place], [place] [address]  Comprehensive metabolic panel     Status: Abnormal   Collection Time: 06/30/17 11:25 PM  Result Value Ref Range   Sodium 140 135 - 145 mmol/L   Potassium 3.5 3.5 - 5.1 mmol/L   Chloride 107 101 - 111 mmol/L   CO2 25 22 - 32 mmol/L   Glucose, Bld 206 (H) 65 - 99 mg/dL   BUN 6 6 - 20 mg/dL   Creatinine, Ser 0.91 0.44 - 1.00 mg/dL   Calcium 8.7 (L) 8.9 - 10.3 mg/dL   Total Protein 7.1 6.5 - 8.1 g/dL   Albumin 3.6 3.5 - 5.0 g/dL   AST 16 15 - 41 U/L   ALT 18 14 - 54 U/L   Alkaline Phosphatase 60 38 - 126 U/L   Total Bilirubin 0.4 0.3 - 1.2 mg/dL   GFR calc non Af Amer >60 >60 mL/min   GFR calc Af Amer >60 >60 mL/min    Comment: (NOTE) The eGFR has been calculated using the CKD EPI equation. This calculation has not been validated in all clinical situations. eGFR's persistently <60 mL/min signify possible Chronic Kidney Disease.    Anion gap 8 5 - 15    Comment: Performed at [facility], [place] [address], [place], [place] [address]  Magnesium     Status: None   Collection Time: 06/30/17 11:25 PM  Result Value Ref Range   Magnesium 1.8 1.7 - 2.4 mg/dL    Comment: Performed at [facility], [place]  [address] [place], [place] [address]  I-stat troponin, ED     Status: None   Collection Time: 06/30/17 11:29 PM  Result Value Ref Range   Troponin i, poc 0.00 0.00 - 0.08 ng/mL   Comment 3            Comment: Due to the release kinetics of cTnI, a negative result within the first hours of the onset of symptoms does not rule out myocardial infarction with certainty. If myocardial infarction is still suspected, repeat the test at appropriate intervals.   I-Stat beta hCG blood, ED     Status: None   Collection Time: 06/30/17 11:29 PM  Result Value Ref Range   I-stat hCG, quantitative <5.0 <5 mIU/mL   Comment 3            Comment:   GEST. AGE      CONC.  (mIU/mL)   <=1 WEEK        5 - 50     2 WEEKS       50 - 500     3 WEEKS       100 - 10,000     4 WEEKS     1,000 - 30,000        FEMALE AND NON-PREGNANT FEMALE:     LESS THAN 5 mIU/mL   I-Stat Chem 8, ED     Status: Abnormal   Collection Time: 06/30/17 11:31 PM  Result Value Ref Range   Sodium 142 135 - 145 mmol/L   Potassium 3.5 3.5 - 5.1 mmol/L   Chloride 106 101 - 111 mmol/L   BUN 4 (L) 6 - 20 mg/dL   Creatinine, Ser 0.90 0.44 - 1.00 mg/dL   Glucose, Bld 208 (H) 65 - 99 mg/dL   Calcium, Ion 1.11 (L) 1.15 - 1.40 mmol/L   TCO2 24 22 - 32 mmol/L   Hemoglobin 15.6 (H) 12.0 -  15.0 g/dL   HCT 46.0 36.0 - 46.0 %  Hemoglobin A1c     Status: Abnormal   Collection Time: 07/01/17  8:00 AM  Result Value Ref Range   Hgb A1c MFr Bld 8.5 (H) 4.8 - 5.6 %    Comment: (NOTE) Pre diabetes:          5.7%-6.4% Diabetes:              >6.4% Glycemic control for   <7.0% adults with diabetes    Mean Plasma Glucose 197.25 mg/dL    Comment: Performed at [place] [address], [place], [place] [address]  Lipid panel     Status: Abnormal   Collection Time: 07/01/17  8:00 AM  Result Value Ref Range   Cholesterol 168 0 - 200 mg/dL   Triglycerides 60 <150 mg/dL   HDL 27 (L) >40 mg/dL   Total CHOL/HDL Ratio 6.2 RATIO   VLDL 12 0 -  40 mg/dL   LDL Cholesterol 129 (H) 0 - 99 mg/dL    Comment:        Total Cholesterol/HDL:CHD Risk Coronary Heart Disease Risk Table                     Men   Women  1/2 Average Risk   3.4   3.3  Average Risk       5.0   4.4  2 X Average Risk   9.6   7.1  3 X Average Risk  23.4   11.0        Use the calculated Patient Ratio above and the CHD Risk Table to determine the patient's CHD Risk.        ATP III CLASSIFICATION (LDL):  <100     mg/dL   Optimal  100-129  mg/dL   Near or Above                    Optimal  130-159  mg/dL   Borderline  160-189  mg/dL   High  >190     mg/dL   Very High Performed at [place] [address] [place], [place] [address]   CBG monitoring, ED     Status: Abnormal   Collection Time: 07/01/17  8:19 AM  Result Value Ref Range   Glucose-Capillary 230 (H) 65 - 99 mg/dL     I have reviewed the images obtained: Ct Angio Head W Or Wo Contrast  Result Date: 07/01/2017 CLINICAL DATA:  42 y/o F; altered mental status, evaluation for stroke. EXAM: CT ANGIOGRAPHY HEAD AND NECK TECHNIQUE: Multidetector CT imaging of the head and neck was performed using the standard protocol during bolus administration of intravenous contrast. Multiplanar CT image reconstructions and MIPs were obtained to evaluate the vascular anatomy. Carotid stenosis measurements (when applicable) are obtained utilizing NASCET criteria, using the distal internal carotid diameter as the denominator. CONTRAST:  131m ISOVUE-370 IOPAMIDOL (ISOVUE-370) INJECTION 76% COMPARISON:  06/30/2017 CT head. FINDINGS: CTA NECK FINDINGS Aortic arch: Standard branching. Imaged portion shows no evidence of aneurysm or dissection. No significant stenosis of the major arch vessel origins. Right carotid system: No evidence of dissection, stenosis (50% or greater) or occlusion. Left carotid system: No evidence of dissection, stenosis (50% or greater) or occlusion. Vertebral arteries: Codominant.  No evidence of dissection, stenosis (50% or greater) or occlusion. Skeleton: Negative. Other neck: Right floor of mouth fat containing structure measuring 14 mm, probably a small lipoma,  partially obscured by streak artifact from dental hardware (series 5, image 57). Upper chest: Negative. Review of the MIP images confirms the above findings CTA HEAD FINDINGS Anterior circulation: No large vessel occlusion, aneurysm, or vascular malformation. Posterior circulation: No large vessel occlusion, aneurysm, or vascular malformation. Multiple segments of stenosis are present in the anterior and posterior circulation best appreciated in the right superior M2, left inferior M2, right P1, left P2 segments. Venous sinuses: As permitted by contrast timing, patent. Anatomic variants: Diminutive right and small left posterior communicating arteries. No anterior communicating artery identified. Delayed phase: No abnormal intracranial enhancement. Review of the MIP images confirms the above findings IMPRESSION: 1. Multiple segments of arterial stenosis in the anterior and posterior circulation which may represent cerebral vasculitis or possibly reversible cerebral vasoconstriction syndrome (RCVS)/PRES given posterior distribution cortical abnormality. 2. No large vessel occlusion, aneurysm, or vascular malformation. 3. Widely patent carotid and vertebral arteries in the neck. These results were called by telephone at the time of interpretation on 06/30/2017 at 11:58 pm to Dr. Delora Fuel , who verbally acknowledged these results. Electronically Signed   By: Kristine Garbe M.D.   On: 07/01/2017 00:05   Ct Angio Neck W Or Wo Contrast  Result Date: 07/01/2017 CLINICAL DATA:  42 y/o F; altered mental status, evaluation for stroke. EXAM: CT ANGIOGRAPHY HEAD AND NECK TECHNIQUE: Multidetector CT imaging of the head and neck was performed using the standard protocol during bolus administration of intravenous contrast.  Multiplanar CT image reconstructions and MIPs were obtained to evaluate the vascular anatomy. Carotid stenosis measurements (when applicable) are obtained utilizing NASCET criteria, using the distal internal carotid diameter as the denominator. CONTRAST:  160m ISOVUE-370 IOPAMIDOL (ISOVUE-370) INJECTION 76% COMPARISON:  06/30/2017 CT head. FINDINGS: CTA NECK FINDINGS Aortic arch: Standard branching. Imaged portion shows no evidence of aneurysm or dissection. No significant stenosis of the major arch vessel origins. Right carotid system: No evidence of dissection, stenosis (50% or greater) or occlusion. Left carotid system: No evidence of dissection, stenosis (50% or greater) or occlusion. Vertebral arteries: Codominant. No evidence of dissection, stenosis (50% or greater) or occlusion. Skeleton: Negative. Other neck: Right floor of mouth fat containing structure measuring 14 mm, probably a small lipoma, partially obscured by streak artifact from dental hardware (series 5, image 57). Upper chest: Negative. Review of the MIP images confirms the above findings CTA HEAD FINDINGS Anterior circulation: No large vessel occlusion, aneurysm, or vascular malformation. Posterior circulation: No large vessel occlusion, aneurysm, or vascular malformation. Multiple segments of stenosis are present in the anterior and posterior circulation best appreciated in the right superior M2, left inferior M2, right P1, left P2 segments. Venous sinuses: As permitted by contrast timing, patent. Anatomic variants: Diminutive right and small left posterior communicating arteries. No anterior communicating artery identified. Delayed phase: No abnormal intracranial enhancement. Review of the MIP images confirms the above findings IMPRESSION: 1. Multiple segments of arterial stenosis in the anterior and posterior circulation which may represent cerebral vasculitis or possibly reversible cerebral vasoconstriction syndrome (RCVS)/PRES given  posterior distribution cortical abnormality. 2. No large vessel occlusion, aneurysm, or vascular malformation. 3. Widely patent carotid and vertebral arteries in the neck. These results were called by telephone at the time of interpretation on 06/30/2017 at 11:58 pm to Dr. DDelora Fuel, who verbally acknowledged these results. Electronically Signed   By: LKristine GarbeM.D.   On: 07/01/2017 00:05   Mr Brain Wo Contrast  Result Date: 07/01/2017 CLINICAL DATA:  Hypertension and diabetes.  Acute presentation with headache and speech disturbance last night. EXAM: MRI HEAD WITHOUT CONTRAST TECHNIQUE: Multiplanar, multiecho pulse sequences of the brain and surrounding structures were obtained without intravenous contrast. COMPARISON:  CT studies 06/30/2017 FINDINGS: Brain: Diffusion imaging shows a cluster of numerous small acute infarctions in the left posterior temporal and temporoparietal junction region consistent with embolic disease in the left MCA territory. No other region of acute insult. No large confluent infarction. The brainstem and cerebellum are normal. No mass effect or hemorrhage. The remainder the brain is negative. No mass, hydrocephalus or extra-axial collection. Vascular: Major vessels at the base of the brain show flow. Skull and upper cervical spine: Negative Sinuses/Orbits: Clear/normal Other: None IMPRESSION: Cluster of acute infarctions in the posterior left temporal lobe and temporoparietal junction region consistent with embolic infarctions in the left MCA territory. No other abnormal brain parenchymal finding. Whereas this could represent an extremely localized manifestation of posterior reversible encephalopathy, I think that is less likely. Electronically Signed   By: Nelson Chimes M.D.   On: 07/01/2017 07:36   Ct Head Code Stroke Wo Contrast  Result Date: 06/30/2017 CLINICAL DATA:  Code stroke. 42 y/o F; altered mental status, code stroke. EXAM: CT HEAD WITHOUT CONTRAST  TECHNIQUE: Contiguous axial images were obtained from the base of the skull through the vertex without intravenous contrast. COMPARISON:  None. FINDINGS: Brain: Small 14 mm wedge-shaped focus of hypoattenuation within the left parieto-occipital junction (series 2, image 18) which may represent an area of acute or subacute infarction. No hemorrhage or mass effect. No additional area of stroke, hemorrhage, or mass effect identified. No hydrocephalus, effacement of basilar cisterns, or extra-axial collection. Vascular: No hyperdense vessel or unexpected calcification. Skull: Normal. Negative for fracture or focal lesion. Sinuses/Orbits: No acute finding. Other: None. ASPECTS [facility] Stroke Program Early CT Score) - Ganglionic level infarction (caudate, lentiform nuclei, internal capsule, insula, M1-M3 cortex): 7 - Supraganglionic infarction (M4-M6 cortex): 3 Total score (0-10 with 10 being normal): 10 IMPRESSION: 1. Small 14 mm cortical focus of hypoattenuation within left parieto-occipital junction which may represent acute or subacute infarction. No hemorrhage or mass effect. 2. ASPECTS is 10 These results were called by telephone at the time of interpretation on 06/30/2017 at 11:26 pm to Dr. Delora Fuel , who verbally acknowledged these results. Electronically Signed   By: Kristine Garbe M.D.   On: 06/30/2017 23:29     Assessment:  42 y.o. female  HTN, T2DM, Endometriosis s/p hysterectomy/BSO, and seasonal allergies who presented after developing slurred speech, expressive aphasia, and slight numbness in her right 1st and 2nd fingers in the setting of hypertensive urgency. Her symptoms have resolved.  Stroke Risk Factors - diabetes mellitus, family history, hyperlipidemia and hypertension.   Dr. Cheral Marker has reviewed the imaging. Differential diagnoses as follows:  1. MCA/PCA territory watershed infarct highest on differential based on imaging findings - may have experienced sudden episode of  relative hypotension.  2. RCVS or Cerebral Vasculitis are also concerning given the multiple segments of arterial stenosis in the anterior and posterior circulation. Do not see any current outpatient medications that would predispose to RCVS.  3. Cardioembolic stroke possible in young woman - Norethindrone can increase hypercoagulable risk. She is a never smoker.   Plan: 1. Preliminary workup for vasculitis ordered: ESR, CRP, ANA, ANCA, RF 2. Hypercoagulable workup ordered: Lupus anticoagulant, Beta-2-glycoprotein ab, anti-cardiolipin antibodies 3. Received Trial of empiric pulse dose steroids for possible vasculitis: IV Methylprednisolone 1000 mg IV x 3 days. Do not  need tapering doses after completion. 4. Repeat CTA Head in 5 days 5. Recommend discontinuing Norethindrone 6. Will need close glycemic control while on steroids 7. Permissive HTN <220/120 for now 8. Prophylactic therapy-Antiplatelet med: Aspirin 81 mg daily, Atorvastatin 40 mg daily 9. HgbA1c 8.5, LDL 129 10. PT consult, OT consult, Speech consult 11. Echocardiogram pending 12. Risk factor modification 13. Telemetry monitoring 14. Frequent neuro checks 15. Stroke team to follow tomorrow   Zada Finders, MD Internal Medicine PGY-3  If 7pm- 7am, please page neurology on call as listed in [place].  07/01/2017, 2:21 PM   NEUROHOSPITALIST ADDENDUM Seen and examined the patient overnight. MRI shows small watershed infarcts in left PCA territory. Patient continued to having waxing/waning symptoms throughout the night. Asked nurse to notify if NIHSS > 4. Started IV fluids.  I agree with documentation by PAC/Resident.Given presentation of headache, and fluctuating symptoms favor RCVS as diagnosis over vasculitis, but will need to follow up on vasculitis labs.  Regarding steroids, would recommend waiting for initial workup before starting steroids- have discontinued Solumedrol.  UDS negative. Will possibly need Lumbar puncture and  TEE.    Stroke team to follow.  Karena Addison Aroor MD [facility] [phone]  If 7pm to 7am, please call on call as listed on [facility]ION.

## 2017-07-01 NOTE — CV Procedure (Signed)
Echocardiogram not completed due to patients medical condition. Nurse informed me that her mental status has just changed. Will try echocardiogram tomorrow. Leta Junglingiffany Audi Conover RDCS

## 2017-07-01 NOTE — Consult Note (Signed)
   [facility] TeleNeurology Consult Services  Impression: difficulty speaking. Left hemispheric TIA v hypertensive emergency/PRES. Will need further workup and admission   Not a tpa candidate due to: nih 0 Not an NIR candidate due to: no lvo per rad read   Comments:   [facility] contacted: [phone] [facility] at bedside: 2349 NIHSS assessment time: 2349  Recommendations:  Admit stroke/telemetry Neuro checks dvt prophy Dysphagia screen Head of bed flat Iv fluids ns Elevated diastolic, treatment per ed Jonne PlyAsa if no contraindications  inpatient neurology consultation Inpatient stroke evaluation as per Neurology/ Internal Medicine Discussed with ED MD  -----------------------------------------------------------------------------------------  CC difficulty concentrating and speaking  History of Present Illness   Patient is a  42 y.o. woman who comes to the hospital for difficulty speaking and confusion. She describes that this began at around 10pm, before this she was fine. She noticed that she could not spell things when texting and was mixing words. This has not happened before, she feels it has significantly improved, but it has not resolved. She came to the hospitals with a sbp of 200s.  Vitals:   06/30/17 2300  BP: (!) 202/122  Pulse: 69  Resp: 18  Temp: 98.6 F (37 C)  SpO2: 99%     Diagnostic: Ct head without contrast: no acute findings cta head with and without contrast-  1. Multiple segments of arterial stenosis in the anterior and posterior circulation which may represent cerebral vasculitis or possibly reversible cerebral vasoconstriction syndrome (RCVS)/PRES given posterior distribution cortical abnormality. 2. No large vessel occlusion, aneurysm, or vascular malformation. 3. Widely patent carotid and vertebral arteries in the neck.  Exam:  NIHSS score: 0   1A: Level of Consciousness - Alert; keenly responsive 1B: Ask Month and Age - Both  Questions Right 1C: 'Blink Eyes' & 'Squeeze Hands' - Performs Both Tasks 2: Test Horizontal Extraocular Movements - Normal 3: Test Visual Fields - No Visual Loss 4: Test Facial Palsy - Normal symmetry 5A: Test Left Arm Motor Drift - No Drift for 10 Seconds 5B: Test Right Arm Motor Drift - No Drift for 10 Seconds 6A: Test Left Leg Motor Drift - No Drift for 5 Seconds 6B: Test Right Leg Motor Drift - No Drift for 5 Seconds 7: Test Limb Ataxia - No Ataxia 8: Test Sensation - Normal; No sensory loss 9: Test Language/Aphasia - Normal; No aphasia 10: Test Dysarthria - Normal 11: Test Extinction/Inattention - No abnormality   Medical Decision Making:  - Extensive number of diagnosis or management options are considered above.   - Extensive amount of complex data reviewed.   - High risk of complication and/or morbidity or mortality are associated with differential diagnostic considerations above.  - There may be Uncertain outcome and increased probability of prolonged functional impairment or high probability of severe prolonged functional impairment associated with some of these differential diagnosis.  Medical Data Reviewed:  1.Data reviewed include clinical labs, radiology,  Medical Tests;   2.Tests results discussed w/performing or interpreting physician;   3.Obtaining/reviewing old medical records;  4.Obtaining case history from another source;  5.Independent review of image, tracing or specimen.    Patient was informed the Neurology Consult would happen via telehealth (remote video) and consented to receiving care in this manner.

## 2017-07-01 NOTE — Progress Notes (Signed)
Returned RN call and there was no answer after Diplomatic Services operational officersecretary transferred. Will call back.

## 2017-07-01 NOTE — ED Notes (Signed)
Patient transported to MRI 

## 2017-07-02 ENCOUNTER — Other Ambulatory Visit (HOSPITAL_COMMUNITY): Payer: BC Managed Care – PPO

## 2017-07-02 ENCOUNTER — Inpatient Hospital Stay (HOSPITAL_COMMUNITY): Payer: BC Managed Care – PPO

## 2017-07-02 DIAGNOSIS — E7849 Other hyperlipidemia: Secondary | ICD-10-CM

## 2017-07-02 DIAGNOSIS — E118 Type 2 diabetes mellitus with unspecified complications: Secondary | ICD-10-CM

## 2017-07-02 LAB — GLUCOSE, CAPILLARY
GLUCOSE-CAPILLARY: 205 mg/dL — AB (ref 65–99)
GLUCOSE-CAPILLARY: 174 mg/dL — AB (ref 65–99)
Glucose-Capillary: 201 mg/dL — ABNORMAL HIGH (ref 65–99)
Glucose-Capillary: 163 mg/dL — ABNORMAL HIGH (ref 65–99)

## 2017-07-02 LAB — EXTRACTABLE NUCLEAR ANTIGEN ANTIBODY
DS DNA AB: 4 [IU]/mL (ref 0–9)
ENA SM Ab Ser-aCnc: <0.2 AI (ref 0.0–0.9)

## 2017-07-02 LAB — ANCA TITERS
Atypical P-ANCA titer: <1:20 {titer}
C-ANCA: <1:20 {titer}

## 2017-07-02 LAB — RAPID URINE DRUG SCREEN, HOSP PERFORMED
AMPHETAMINES: NOT DETECTED
Barbiturates: NOT DETECTED
Benzodiazepines: NOT DETECTED
Cocaine: NOT DETECTED
Opiates: NOT DETECTED
TETRAHYDROCANNABINOL: NOT DETECTED

## 2017-07-02 LAB — LUPUS ANTICOAGULANT PANEL
DRVVT: 41.8 s (ref 0.0–47.0)
PTT LA: 34.4 s (ref 0.0–51.9)

## 2017-07-02 LAB — HOMOCYSTEINE: Homocysteine: 12.9 umol/L (ref 0.0–15.0)

## 2017-07-02 LAB — CARDIOLIPIN ANTIBODIES, IGG, IGM, IGA
Anticardiolipin IgA: <9 APL U/mL (ref 0–11)
Anticardiolipin IgG: <9 GPL U/mL (ref 0–14)
Anticardiolipin IgM: 15 MPL U/mL — ABNORMAL HIGH (ref 0–12)

## 2017-07-02 LAB — PROTEIN S ACTIVITY: PROTEIN S ACTIVITY: 108 % (ref 63–140)

## 2017-07-02 LAB — PROTEIN S, TOTAL: Protein S Ag, Total: 87 % (ref 60–150)

## 2017-07-02 LAB — RHEUMATOID FACTOR: Rhuematoid fact SerPl-aCnc: 10.5 IU/mL (ref 0.0–13.9)

## 2017-07-02 LAB — PROTEIN C, TOTAL: PROTEIN C, TOTAL: 92 % (ref 60–150)

## 2017-07-02 LAB — ANTI-JO 1 ANTIBODY, IGG: Anti JO-1: <0.2 AI (ref 0.0–0.9)

## 2017-07-02 LAB — PROTEIN C ACTIVITY: Protein C Activity: 110 % (ref 73–180)

## 2017-07-02 MED ORDER — SODIUM CHLORIDE 0.9 % IV BOLUS (SEPSIS)
250.0000 mL | Freq: Once | INTRAVENOUS | Status: AC
  Administered 2017-07-02: 250 mL via INTRAVENOUS

## 2017-07-02 MED ORDER — SODIUM CHLORIDE 0.9 % IV BOLUS (SEPSIS): 250.0000 mL | Freq: Once | INTRAVENOUS | Status: AC

## 2017-07-02 MED ORDER — SODIUM CHLORIDE 0.9 % IV SOLN
INTRAVENOUS | Status: DC
  Administered 2017-07-02 – 2017-07-04 (×7): via INTRAVENOUS

## 2017-07-02 MED ORDER — ALBUMIN HUMAN 5 % IV SOLN
12.5000 g | Freq: Four times a day (QID) | INTRAVENOUS | Status: AC
  Administered 2017-07-02 – 2017-07-03 (×4): 12.5 g via INTRAVENOUS
  Filled 2017-07-02 (×4): qty 250

## 2017-07-02 MED ORDER — CLOPIDOGREL BISULFATE 75 MG PO TABS
75.0000 mg | ORAL_TABLET | Freq: Once | ORAL | Status: AC
  Administered 2017-07-03: 75 mg via ORAL
  Filled 2017-07-02: qty 1

## 2017-07-02 MED ORDER — CLOPIDOGREL BISULFATE 75 MG PO TABS
300.0000 mg | ORAL_TABLET | Freq: Once | ORAL | Status: AC
  Administered 2017-07-02: 300 mg via ORAL
  Filled 2017-07-02: qty 4

## 2017-07-02 MED ORDER — IOPAMIDOL (ISOVUE-370) INJECTION 76%
INTRAVENOUS | Status: AC
  Administered 2017-07-02: 90 mL
  Filled 2017-07-02: qty 100

## 2017-07-02 MED ORDER — WHITE PETROLATUM EX OINT
TOPICAL_OINTMENT | CUTANEOUS | Status: AC
  Filled 2017-07-02: qty 28.35

## 2017-07-02 MED ORDER — LABETALOL HCL 5 MG/ML IV SOLN
20.0000 mg | INTRAVENOUS | Status: DC | PRN
  Administered 2017-07-06 (×2): 10 mg via INTRAVENOUS
  Administered 2017-07-07: 5 mg via INTRAVENOUS
  Administered 2017-07-07 (×3): 10 mg via INTRAVENOUS
  Filled 2017-07-02 (×2): qty 4

## 2017-07-02 NOTE — Progress Notes (Signed)
OT Cancellation Note  Patient Details Name: [name] MRN: [id number] DOB: 01/18/1976   Cancelled Treatment:    Reason Eval/Treat Not Completed: Medical issues which prohibited therapy;Other (comment)(orders discontinued 07/02/17, new orders to start 07/03/2017)  Galen ManilaSpencer, Lacoya Wilbanks Jeanette 07/02/2017, 11:09 AM

## 2017-07-02 NOTE — Progress Notes (Signed)
Pt CTA head and neck unchanged with b/l M2 high grade stenosis. CTP showed small perfusion deficit TTP > 6sec, but with relatively larger deficit with TTP > 4 sec but likely due to left M2 high grade stenosis. Pt continue to have mild expressive aphasia and hesitancy but improved, no significant facial droop now.   Will give plavix 300mg  load and high dose lipitor 80. Keep bed rest and give albumin Q6h for 4 dose and increase IVF rate. BP at 170s. Continue close monitoring.   Debra PlanJindong Rashaun Wichert, MD PhD Stroke Neurology 07/02/2017 7:08 PM

## 2017-07-02 NOTE — Evaluation (Signed)
Speech Language Pathology Evaluation Patient Details Name: Debra [name] MRN: [id number] DOB: 07/13/1975 Today's Date: 07/02/2017 Time: [id number] SLP Time Calculation (min) (ACUTE ONLY): 30 min  Problem List:  Patient Active Problem List   Diagnosis Date Noted  . TIA (transient ischemic attack) 07/01/2017  . Hypertensive urgency 07/01/2017  . Hypertensive encephalopathy 07/01/2017  . Acute embolic stroke (HCC) 07/01/2017  . Drug allergy 04/24/2017  . Acute maxillary sinusitis 04/19/2017  . Low back pain 10/18/2016  . Abscess of chest wall 06/21/2016  . Left shoulder pain 05/21/2016  . Routine general medical examination at a health care facility 10/13/2015  . Endometriosis 11/03/2013  . Right knee pain   . Hypertension 08/26/2011  . Hx of laparoscopic gastric banding, 10/02/2010. 11/27/2010  . Cough 02/23/2010  . Diabetes mellitus type 2, uncontrolled (HCC) 08/23/2009  . Morbid obesity (HCC) 08/22/2009  . Allergic rhinitis 08/22/2009  . Asthma 08/22/2009  . ARTHRITIS 08/22/2009   Past Medical History:  Past Medical History:  Diagnosis Date  . Allergic rhinitis, cause unspecified   . Asthma   . Diabetes mellitus type II 08/2009 dx  . Endometriosis   . Hypertension   . Knee pain, right    DJD, post traumatic with repeat falls  . Morbid obesity (HCC)    s/p lab band 09/2010  . Ovarian cyst    right s/p resection June 2013   Past Surgical History:  Past Surgical History:  Procedure Laterality Date  . ABDOMINAL HYSTERECTOMY  05/14/12   [place] [facility]  . LAPAROSCOPIC GASTRIC BANDING  10/02/2010   start weight 349#  . SALPINGOOPHORECTOMY  10/08/11   right   HPI:  42 y.o.femalewithhistory of hypertension and diabetes mellitus type 2 started to experience headache with difficulty speaking  evening of 3/11. MRI Brain WO contrast showed a cluster of acute infarctions in the posterior left temporal lobe and temporoparietal junction region consistent with embolic  infarctions in the left MCA territorywhich the radiologist felt could be an extremely localized manifestation of PRES, but thought this was less likely. Pt with subsequent waxing/waning symptoms.    Assessment / Plan / Recommendation Clinical Impression  Pt presents with variability with expressive and receptive language.  There is no dysarthria. Output is generally fluent, with intact syntactic construct, but there is slow and deliberate expression and hesitations during attempts at word retrieval.  Pt has difficulty following three step commands and answers yes/no questions re: paragraph info with 75% reliability.  There are deficits during tasks of confrontation and responsive naming, but pt is often able to substitute a gesture when she is anomic.  Automatic/overlearned sequences (counting, reciting days of week or months of year) are recited with inconsistent accuracy.  Recommend SLP f/u for fluctuating aphasia - will await OT/PT recs for disposition.     SLP Assessment  SLP Recommendation/Assessment: Patient needs continued Speech Lanaguage Pathology Services SLP Visit Diagnosis: Aphasia (R47.01)    Follow Up Recommendations  Other (comment)(tba)    Frequency and Duration min 2x/week  1 week      SLP Evaluation Cognition  Overall Cognitive Status: Within Functional Limits for tasks assessed Arousal/Alertness: Awake/alert Orientation Level: Oriented X4 Attention: Sustained Sustained Attention: Appears intact       Comprehension  Auditory Comprehension Overall Auditory Comprehension: Impaired Yes/No Questions: Impaired Complex Questions: 75-100% accurate Paragraph Comprehension (via yes/no questions): 76-100% accurate Commands: Impaired Multistep Basic Commands: 75-100% accurate    Expression Expression Primary Mode of Expression: Verbal Verbal Expression Overall Verbal Expression: Impaired  Initiation: No impairment Automatic Speech: Name(unable to recite DOW or  MOY) Level of Generative/Spontaneous Verbalization: Conversation Repetition: Impaired Level of Impairment: Phrase level(both high and low probability words are impaired) Naming: Impairment Responsive: 26-50% accurate Confrontation: Impaired Convergent: 50-74% accurate Divergent: 25-49% accurate Other Naming Comments: is able to gesture to compensate for dysnomia Pragmatics: No impairment Written Expression Dominant Hand: Right Written Expression: (difficulty maintaining grip right hand)   Oral / Motor  Oral Motor/Sensory Function Overall Oral Motor/Sensory Function: Mild impairment Facial Symmetry: Abnormal symmetry right;Suspected CN VII (facial) dysfunction Motor Speech Overall Motor Speech: Appears within functional limits for tasks assessed   GO                   Debra [name] L. Debra [name], [place] CCC/SLP Pager [phone]  Debra [name] Debra [name] 07/02/2017, 10:07 AM

## 2017-07-02 NOTE — Progress Notes (Signed)
PT Cancellation Note  Patient Details Name: Debra [name] MRN: [id number] DOB: 12/30/1975   Cancelled Treatment:    Reason Eval/Treat Not Completed: Medical issues which prohibited therapy Holding PT evaluation per RN secondary to recurring neuro symptoms with mobility to the bathroom this AM. MD wants pt resting in bed today. Will follow up when more stable/appropriate.    [name] 07/02/2017, 11:55 AM [name], PT, DPT [phone]

## 2017-07-02 NOTE — Progress Notes (Signed)
Chaplain visited with the PT and mother, we shared in prayer and rejoicing.  The PT has great confidence that she will be healed from this issue.  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

## 2017-07-02 NOTE — Progress Notes (Addendum)
I was notified by Norlene RN of neuro change, expressive aphasia.  Upon arrival to the room, pt was alert sitting up in the bed.  No focal weakness found.  Expressive aphasia and slurred speech present. NIH 2.  Dr. Laurence SlateAroor notified and arrived to see patient while we were present.  Wandering symptoms.  As he was examining, the patients speech cleared but still had some expressive aphasia.  Dr. Laurence SlateAroor stated POC and ordered IV fluids and continue current monitoring.  He stated to notify him if NIH changed by 4.  No acute interventions at this time.

## 2017-07-02 NOTE — Progress Notes (Addendum)
STROKE TEAM PROGRESS NOTE   SUBJECTIVE (INTERVAL HISTORY) Her mother is at the bedside.  Nurse, [name], reports waxing and waning of dysarthria and expressive aphasia during the night and again this am. The last episode happened around 1030 when she got up to go to the bathroom. Speech with only mild hesitancy and no facial droop before getting up. Afterwards, she had increased expressive aphasia and a significant R facial droop. BP 170s. HR ok. NIHSS 4.  Denies hx migraines or other HA. No hx of other stroke symptoms.   OBJECTIVE Temp:  [97.8 F (36.6 C)-99.2 F (37.3 C)] 99 F (37.2 C) (03/13 0730) Pulse Rate:  [70-93] 85 (03/13 0730) Cardiac Rhythm: Normal sinus rhythm (03/13 0800) Resp:  [16-26] 20 (03/13 0730) BP: (160-194)/(72-105) 173/100 (03/13 0730) SpO2:  [94 %-100 %] 100 % (03/13 0730)  CBC:  Recent Labs  Lab 06/30/17 2325 06/30/17 2331  WBC 9.3  --   NEUTROABS 6.2  --   HGB 14.3 15.6*  HCT 44.4 46.0  MCV 84.4  --   PLT 325  --     Basic Metabolic Panel:  Recent Labs  Lab 06/30/17 2325 06/30/17 2331  NA 140 142  K 3.5 3.5  CL 107 106  CO2 25  --   GLUCOSE 206* 208*  BUN 6 4*  CREATININE 0.91 0.90  CALCIUM 8.7*  --   MG 1.8  --     Lipid Panel:     Component Value Date/Time   CHOL 168 07/01/2017 0800   TRIG 60 07/01/2017 0800   HDL 27 (L) 07/01/2017 0800   CHOLHDL 6.2 07/01/2017 0800   VLDL 12 07/01/2017 0800   LDLCALC 129 (H) 07/01/2017 0800   HgbA1c:  Lab Results  Component Value Date   HGBA1C 8.5 (H) 07/01/2017   Urine Drug Screen:     Component Value Date/Time   LABOPIA NONE DETECTED 07/02/2017 0254   COCAINSCRNUR NONE DETECTED 07/02/2017 0254   LABBENZ NONE DETECTED 07/02/2017 0254   AMPHETMU NONE DETECTED 07/02/2017 0254   THCU NONE DETECTED 07/02/2017 0254   LABBARB NONE DETECTED 07/02/2017 0254    Alcohol Level No results found for: [place] Head Code Stroke Wo Contrast 06/30/2017 2329  1. Small 14 mm cortical  focus of hypoattenuation within left parieto-occipital junction which may represent acute or subacute infarction. No hemorrhage or mass effect. 2. ASPECTS is 10   Ct Angio Head W Or Wo Contrast Ct Angio Neck W Or Wo Contrast 07/01/2017  1. Multiple segments of arterial stenosis in the anterior and posterior circulation which may represent cerebral vasculitis or possibly reversible cerebral vasoconstriction syndrome (RCVS)/PRES given posterior distribution cortical abnormality. 2. No large vessel occlusion, aneurysm, or vascular malformation. 3. Widely patent carotid and vertebral arteries in the neck.   Ct Head Wo Contrast 07/01/2017 Stable infarct in the medial left occipital lobe. No new area of decreased attenuation evident by noncontrast enhanced CT. Note the noncontrast enhanced CT from 1 day prior as well as the current examination demonstrates less extensive infarct than is demonstrated on MR obtained earlier in the day. No hemorrhage or mass evident.   Mr Brain Wo Contrast 07/01/2017 0736 Cluster of acute infarctions in the posterior left temporal lobe and temporoparietal junction region consistent with embolic infarctions in the left MCA territory. No other abnormal brain parenchymal finding. Whereas this could represent an extremely localized manifestation of posterior reversible encephalopathy, I think that is less likely.    GENERAL  EXAM: Patient is in no distress  CARDIOVASCULAR: Regular rate and rhythm, no murmurs, no carotid bruits  NEUROLOGIC: MENTAL STATUS: awake, alert, speech hesitant with inconsistent expressive errors. comprehension intact. Can follow 3 step commands without difficulty.  CRANIAL NERVE: visual fields full to confrontation, extraocular muscles intact, no nystagmus, R lower facial weakness. facial sensation symmetric, tongue deviates to there right. Denies finger numbness MOTOR: normal bulk and tone, full strength in the BUE, BLE SENSORY: normal and symmetric  to light touch, temperature COORDINATION: finger-nose-finger, fine finger movements, heel-shin normal GAIT/STATION: deferred.   ASSESSMENT/PLAN Debra [name] is a 42 y.o. female with history of HTN, type 2 DM, obesity, endometriosis/ovarian cyst s/p hysterectomy/SBO and seasonal allergies presenting initially to [facility] with slurred speech and expressive aphasia. She did not receive IV t-PA due to NIH 0.   Stroke:   Left posterior temporal lobe infarcts L MCA territory in setting of uncontrolled risk factors and intracranial atherosclerosis, infarcts felt to be secondary to small vessel disease source  Resultant  Mild expressive aphasia and R facial droop  Waxing and waning of symptoms over night and this am. Treated with saline bolus. Keep in bed today with plans for OOB in am  CT head Small 14 mm cortical focus of hypoattenuation within left parieto-occipital junction possible acute infarct, ASPECTS 10  CTA H&N Multiple anterior and posterior stenosis, no ELVO  Repeat CT head w/ worsening stable left medial occipital lobe infarct, no new abnormalities.   MRI head cluster of acute infarct posterior left temporal lobe and temporoparietal junction region in the L MCA territory  2D Echo  pending   LDL 8.5  HgbA1c 12.9  SCDs for VTE prophylaxis  Diet Carb Modified Fluid consistency: Thin;   No antithrombotic prior to admission, now on aspirin 325 mg daily. Anticipate the addition of plavix to aspirin x 3 months at time of discharge  Patient counseled to be compliant with her antithrombotic medications  Ongoing aggressive stroke risk factor management  At this time, do not suspect RCVS or cerebral vasculitis (received empiric pulse dose steroids) - f/u labs (ESR 13, RA latex turbid 10.5, ANCA ab & titers pending, RF 10.5, anti-jo a ab pending, extractable nuclear antigen ab pending, CRP 1.0, CL ab pending, PT gene mut pending, Factor 5 leiden pending, homo pending, B2GP pending,  Lupus AC pending, Prot C&S pending, anti III  85)  Therapy recommendations:  Pending (deferred until am)  Disposition:  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)  Hypertension  Elevated but Stable, 170s Permissive hypertension (OK if < 220/120) but gradually normalize in 5-7 days Give cozaar this am, recheck BP at noon and give norvasc if remains stable Long-term BP goal normotensive  Hyperlipidemia  Home meds:  None  Now on Lipitor 80,  LDL 129, goal < 70  Continue statin at discharge  Diabetes  HgbA1c 8.5, goal < 7.0  Uncontrolled  Diabetes education discussed  Other Stroke Risk Factors  Morbid Obesity, Body mass index is 49.72 kg/m., recommend weight loss, diet and exercise as appropriate   UDS level neg  Home norethindrone d/c'd on admission  Family hx stroke (father)  No hx Migraines  Consider OP eval for obstructive sleep apnea given morbid obesity  Hospital day # [facility] See Amion for Pager information 07/02/2017 11:58 AM   ATTENDING NOTE: I reviewed above note and agree with the assessment and plan. I have made any additions or  clarifications directly to the above note. Pt was seen and examined.   42 year old female with history hypertension, diabetes, obesity admitted for episode of aphasia, slow thought processing and right hand numbness as well as right temporal headache.  Symptom resolved on admission.  BP elevated.  CT [place] for left occipital stroke.  CTA of the neck showed bilateral M2 stenosis, questionable for our CVS or intracranial stenosis.  MRI showed left inferior MCA patchy small infarcts.  LDL 129 and A1c 8.5.  UDS negative.  Currently on aspirin 325 and Lipitor 80.  However, overnight her symptoms fluctuate, with expressive aphasia and slurred speech on sitting up.  After IV fluid in the lying down, patient symptoms come back.  This morning NIHSS was 1.  However, episode happened again around 1030  when she got up to go to the bathroom. Speech with only mild hesitancy and no facial droop before getting up. Afterwards, she had increased expressive aphasia and a significant R facial droop. BP 170s. HR ok. NIHSS 4.  She was put on normal saline at 100 cc/h and lying down flat.  Symptoms getting improved.  However, on my examination 1230pm, she was sitting up for lunch, started again expressive aphasia and right facial droop, and right lower quadrantanopia. NIHSS = 5. After lying down, speech and right facial droop starting to improve.  Will do stat CTA head and neck and CTP for evaluation. If theno bleeding and not candidate for procedure, will need plavix load and liptor high dose as well as albumin. Will follow.  NIH Stroke Scale  Level Of Consciousness 0=Alert; keenly responsive 1=Not alert, but arousable by minor stimulation 2=Not alert, requires repeated stimulation 3=Responds only with reflex movements 0  LOC Questions to Month and Age 4=Answers both questions correctly 1=Answers one question correctly 2=Answers neither question correctly 0  LOC Commands      -Open/Close eyes     -Open/close grip 0=Performs both tasks correctly 1=Performs one task correctly 2=Performs neighter task correctly 0  Best Gaze 0=Normal 1=Partial gaze palsy 2=Forced deviation, or total gaze paresis 0  Visual 0=No visual loss 1=Partial hemianopia 2=Complete hemianopia 3=Bilateral hemianopia (blind including cortical blindness) 1  Facial Palsy 0=Normal symmetrical movement 1=Minor paralysis (asymmetry) 2=Partial paralysis (lower face) 3=Complete paralysis (upper and lower face) 2  Motor  0=No drift, limb holds posture for full 10 seconds 1=Drift, limb holds posture, no drift to bed 2=Some antigravity effort, cannot maintain posture, drifts to bed 3=No effort against gravity, limb falls 4=No movement Right Arm 0     Leg 0    Left Arm 0     Leg 0  Limb Ataxia 0=Absent 1=Present in one  limb 2=Present in two limbs 0  Sensory 0=Normal 1=Mild to moderate sensory loss 2=Severe to total sensory loss 0  Best Language 0=No aphasia, normal 1=Mild to moderate aphasia 2=Mute, global aphasia 3=Mute, global aphasia 1  Dysarthria 0=Normal 1=Mild to moderate 2=Severe, unintelligible or mute/anarthric 1  Extinction/Neglect 0=No abnormality 1=Extinction to bilateral simultaneous stimulation 2=Profound neglect 0  Total   5    Rosalin Hawking, MD PhD Stroke Neurology 07/02/2017 12:46 PM   I spent  35 minutes in total face-to-face time with the patient, more than 50% of which was spent in counseling and coordination of care, reviewing test results, images and medication, and discussing the diagnosis of stroke with fluctuating symptoms, treatment plan and potential prognosis. This patient's care requiresreview of multiple databases, neurological assessment, discussion with family, other specialists and medical decision  making of high complexity.    To contact Stroke Continuity provider, please refer to [email or web address] After hours, contact General Neurology

## 2017-07-02 NOTE — Progress Notes (Addendum)
Pt.'s speech had gotten better but after CT [facility] by the CT tech and the patient's speech immediately got worse again. Laid back down flat and returned to room.   Once in room the patient said she could not void unless [facility] was raised a little bit. HOB lifted per pt. request to 20 degrees and symptoms again returned. Dr. Roda ShuttersXu notified, new orders received.   Discussed re-doing the RN Swallow Screen with Dr. Roda ShuttersXu and Jasmine DecemberSharon. Since pt. Is unable to have the [facility] for any period of time due to neurologic worsening the swallow screen will not be repeated. Pt. Initially passed the screen in the ED. Pt. given meds while flat by crushing the meds and giving with applesauce. Pt. With no signs of aspiration with applesauce or water.

## 2017-07-02 NOTE — Progress Notes (Signed)
PROGRESS NOTE    Debra [name]  ZOX:[id number] DOB: 01/06/1976 DOA: 06/30/2017 PCP: Myrlene Brokerrawford, Elizabeth A, MD   Brief Narrative:  42 y.o. BF PMHx Asthma, Diabetes mellitus type II, Endometriosis, HTN, DJD, post traumatic with repeat falls, Morbid obesity S/P lab band 09/2010, Ovarian cyst, right s/p resection June 2013  Started to experience headache with difficulty speaking around 10 PM last night.  Patient headache was in the right temporal area just pounding in nature and severe with no associated visual symptoms.  Patient found it difficult to understand things and speak.  Patient herself drove to the ER.  Denies any weakness of the extremities.  Denies any difficulty swallowing.  Has not had any routine headaches.  Patient had mild tingling of the first 2 fingers in the right hand.   ED Course: The ER patient blood pressure was more than 200 systolic.  CT angiogram of the neck was done which did not show any large vessel occlusion but features were concerning for cerebral vasculitis versus PRES versus RCVS.  Tele neurology was consulted and this time they felt that patient does not have acute stroke.  Patient was given hydralazine followed by diphenhydramine and Compazine hand patient's headache and symptoms largely improved.  Patient is being admitted for further management of possible hypertensive encephalopathy versus TIA and [facility].  On my exam patient appears nonfocal.  States the headache is largely resolved.    Subjective: 3/13 A/O 4, negative CP, negative SOB, negative abdominal pain, negative N/V.   Assessment & Plan:   Principal Problem:   TIA (transient ischemic attack) Active Problems:   Diabetes mellitus type 2, uncontrolled (HCC)   Asthma   Hypertensive urgency   Hypertensive encephalopathy   Acute embolic stroke (HCC)  Acute LEFT Temporal lobe CVA LEFT MCA territory -permit permissive HTN -echocardiogram pending -3/12  Hemoglobin A1C= 8.5 -ASA 325 mg -Plavix 300 mg--> 75 mg daily -Albumin load per neurology -PT/OT onsult when cleared by neurology Currently patient must remain in supine position or stroke symptoms worsen   Diabetes type 2 uncontrolled complication -3/12 Hemoglobin A1c= 8.5 -Hemoglobin A1c goal< 7 -Lantus 10 units daily -Sensitive SSI  HLD -Lipid panel not within ADA guidelines -LDL goal< 70 -8/12 Lipitor 80 mg daily  Hypertensive urgency -Allow for permissive hypertension secondary to acute CVA -SBP goal 160-190 -Over next 5-7 days will gradually decrease to normal range  Headache -Currently asymptomatic    DVT prophylaxis: SCD Code Status: full Family Communication: mother at bedside for discussion of plan of care Disposition Plan: TBD   Consultants:  Stroke team    Procedures/Significant Events:  3/11 CTA head and neck:Multiple segments of arterial stenosis in the anterior and posterior circulation which may represent cerebral vasculitis or possibly reversible cerebral vasoconstriction syndrome (RCVS)/PRES given posterior distribution cortical abnormality. 2. No large vessel occlusion, aneurysm, or vascular malformation. 3. Widely patent carotid and vertebral arteries in the neck. 3/12MRI brain wo contrast:Cluster of acute infarctions in the posterior left temporal lobe and temporoparietal junction region consistent with embolic infarctions in the left MCA territory. No other abnormal brain parenchymal finding. Whereas this could represent an extremely localized manifestation of posterior reversible encephalopathy, I think that is less likely. 3/12CT head WO contrast:Stable infarct in the medial left occipital lobe. No new area of decreased attenuation evident by noncontrast enhanced CT. Note the noncontrast enhanced CT from 1 day prior as well as the current examination demonstrates less extensive infarct than is  demonstrated on MR obtained earlier in the  day. No hemorrhage or mass evident.     I have personally reviewed and interpreted all radiology studies and my findings are as above.  VENTILATOR SETTINGS:    Cultures   Antimicrobials: Anti-infectives (From admission, onward)   None       Devices    LINES / TUBES:      Continuous Infusions: . sodium chloride 100 mL/hr at 07/02/17 0305     Objective: Vitals:   07/02/17 0200 07/02/17 0341 07/02/17 0630 07/02/17 0730  BP: (!) 175/79 (!) 163/90 (!) 179/72 (!) 173/100  Pulse: 88 83 82 85  Resp: 20 18 20 20   Temp: 98.1 F (36.7 C) 98.7 F (37.1 C) 98.2 F (36.8 C) 99 F (37.2 C)  TempSrc: Oral Oral Oral Axillary  SpO2: 100% 96% 94% 100%  Weight:      Height:        Intake/Output Summary (Last 24 hours) at 07/02/2017 0850 Last data filed at 07/02/2017 0600 Gross per 24 hour  Intake 847.5 ml  Output 800 ml  Net 47.5 ml   Filed Weights   06/30/17 2301  Weight: (!) 327 lb (148.3 kg)    Examination:  General: A/O 4No acute respiratory distress Neck:  Negative scars, masses, torticollis, lymphadenopathy, JVD Lungs: Clear to auscultation bilaterally without wheezes or crackles Cardiovascular: Regular rate and rhythm without murmur gallop or rub normal S1 and S2 Abdomen: morbidly obese, negative abdominal pain, nondistended, positive soft, bowel sounds, no rebound, no ascites, no appreciable mass Extremities: No significant cyanosis, clubbing, or edema bilateral lower extremities Skin: Negative rashes, lesions, ulcers Psychiatric:  Negative depression, negative anxiety, negative fatigue, negative mania  Central nervous system:  Cranial nerves II through XII intact, tongue/uvula midline, all extremities muscle strength 5/5, sensation intact throughout, finger nose finger bilateral within normal limits, quick finger touch bilateral within normal limits, negative dysarthria, positive expressive aphasia, negative receptive aphasia.  .     Data Reviewed:  Care during the described time interval was provided by me .  I have reviewed this patient's available data, including medical history, events of note, physical examination, and all test results as part of my evaluation.   CBC: Recent Labs  Lab 06/30/17 2325 06/30/17 2331  WBC 9.3  --   NEUTROABS 6.2  --   HGB 14.3 15.6*  HCT 44.4 46.0  MCV 84.4  --   PLT 325  --    Basic Metabolic Panel: Recent Labs  Lab 06/30/17 2325 06/30/17 2331  NA 140 142  K 3.5 3.5  CL 107 106  CO2 25  --   GLUCOSE 206* 208*  BUN 6 4*  CREATININE 0.91 0.90  CALCIUM 8.7*  --   MG 1.8  --    GFR: Estimated Creatinine Clearance: 124.3 mL/min (by C-G formula based on SCr of 0.9 mg/dL). Liver Function Tests: Recent Labs  Lab 06/30/17 2325  AST 16  ALT 18  ALKPHOS 60  BILITOT 0.4  PROT 7.1  ALBUMIN 3.6   No results for input(s): LIPASE, AMYLASE in the last 168 hours. No results for input(s): AMMONIA in the last 168 hours. Coagulation Profile: Recent Labs  Lab 06/30/17 2325  INR 1.06   Cardiac Enzymes: No results for input(s): CKTOTAL, CKMB, CKMBINDEX, TROPONINI in the last 168 hours. BNP (last 3 results) No results for input(s): PROBNP in the last 8760 hours. HbA1C: Recent Labs    07/01/17 0800  HGBA1C 8.5*   CBG:  Recent Labs  Lab 07/01/17 0819 07/01/17 1217 07/01/17 1814 07/01/17 2120 07/02/17 0626  GLUCAP 230* 197* 190* 183* 201*   Lipid Profile: Recent Labs    07/01/17 0800  CHOL 168  HDL 27*  LDLCALC 129*  TRIG 60  CHOLHDL 6.2   Thyroid Function Tests: No results for input(s): TSH, T4TOTAL, FREET4, T3FREE, THYROIDAB in the last 72 hours. Anemia Panel: No results for input(s): VITAMINB12, FOLATE, FERRITIN, TIBC, IRON, RETICCTPCT in the last 72 hours. Urine analysis:    Component Value Date/Time   COLORURINE YELLOW 04/20/2016 0305   APPEARANCEUR CLEAR 04/20/2016 0305   LABSPEC 1.024 04/20/2016 0305   PHURINE 5.0 04/20/2016 0305   GLUCOSEU 150 (A) 04/20/2016  0305   GLUCOSEU NEGATIVE 04/05/2013 1003   HGBUR SMALL (A) 04/20/2016 0305   BILIRUBINUR NEGATIVE 04/20/2016 0305   KETONESUR 20 (A) 04/20/2016 0305   PROTEINUR NEGATIVE 04/20/2016 0305   UROBILINOGEN 0.2 02/10/2015 0751   NITRITE NEGATIVE 04/20/2016 0305   LEUKOCYTESUR MODERATE (A) 04/20/2016 0305   Sepsis Labs: @LABRCNTIP (procalcitonin:4,lacticidven:4)  )No results found for this or any previous visit (from the past 240 hour(s)).       Radiology Studies: Ct Angio Head W Or Wo Contrast  Result Date: 07/01/2017 CLINICAL DATA:  42 y/o F; altered mental status, evaluation for stroke. EXAM: CT ANGIOGRAPHY HEAD AND NECK TECHNIQUE: Multidetector CT imaging of the head and neck was performed using the standard protocol during bolus administration of intravenous contrast. Multiplanar CT image reconstructions and MIPs were obtained to evaluate the vascular anatomy. Carotid stenosis measurements (when applicable) are obtained utilizing NASCET criteria, using the distal internal carotid diameter as the denominator. CONTRAST:  ISOVUE-370 IOPAMIDOL (ISOVUE-370) INJECTION 76% COMPARISON:  06/30/2017 CT head. FINDINGS: CTA NECK FINDINGS Aortic arch: Standard branching. Imaged portion shows no evidence of aneurysm or dissection. No significant stenosis of the major arch vessel origins. Right carotid system: No evidence of dissection, stenosis (50% or greater) or occlusion. Left carotid system: No evidence of dissection, stenosis (50% or greater) or occlusion. Vertebral arteries: Codominant. No evidence of dissection, stenosis (50% or greater) or occlusion. Skeleton: Negative. Other neck: Right floor of mouth fat containing structure measuring 14 mm, probably a small lipoma, partially obscured by streak artifact from dental hardware (series 5, image 57). Upper chest: Negative. Review of the MIP images confirms the above findings CTA HEAD FINDINGS Anterior circulation: No large vessel occlusion, aneurysm,  or vascular malformation. Posterior circulation: No large vessel occlusion, aneurysm, or vascular malformation. Multiple segments of stenosis are present in the anterior and posterior circulation best appreciated in the right superior M2, left inferior M2, right P1, left P2 segments. Venous sinuses: As permitted by contrast timing, patent. Anatomic variants: Diminutive right and small left posterior communicating arteries. No anterior communicating artery identified. Delayed phase: No abnormal intracranial enhancement. Review of the MIP images confirms the above findings IMPRESSION: 1. Multiple segments of arterial stenosis in the anterior and posterior circulation which may represent cerebral vasculitis or possibly reversible cerebral vasoconstriction syndrome (RCVS)/PRES given posterior distribution cortical abnormality. 2. No large vessel occlusion, aneurysm, or vascular malformation. 3. Widely patent carotid and vertebral arteries in the neck. These results were called by telephone at the time of interpretation on 06/30/2017 at 11:58 pm to Dr. Dione Booze , who verbally acknowledged these results. Electronically Signed   By: Mitzi Hansen M.D.   On: 07/01/2017 00:05   Ct Head Wo Contrast  Result Date: 07/01/2017 CLINICAL DATA:  Transient slurred speech EXAM: CT  HEAD WITHOUT CONTRAST TECHNIQUE: Contiguous axial images were obtained from the base of the skull through the vertex without intravenous contrast. COMPARISON:  Head CT June 30, 2017 and brain MRI July 01, 2017 FINDINGS: Brain: There is a stable area of decreased attenuation in the medial left occipital lobe consistent with recent infarct in this area, confirmed on diffusion MR imaging. By CT, no new focus of decreased attenuation evident. No evident mass, hemorrhage, extra-axial fluid collection, or midline shift. Vascular: No hyperdense vessel evident. No vascular calcification evident. Skull: Bony calvarium appears intact.  Sinuses/Orbits: Visualized paranasal sinuses are clear. Visualized orbits appear symmetric bilaterally. Other: Mastoid air cells are clear. IMPRESSION: Stable infarct in the medial left occipital lobe. No new area of decreased attenuation evident by noncontrast enhanced CT. Note the noncontrast enhanced CT from 1 day prior as well as the current examination demonstrates less extensive infarct than is demonstrated on MR obtained earlier in the day. No hemorrhage or mass evident. Electronically Signed   By: Bretta Bang III M.D.   On: 07/01/2017 14:16   Ct Angio Neck W Or Wo Contrast  Result Date: 07/01/2017 CLINICAL DATA:  42 y/o F; altered mental status, evaluation for stroke. EXAM: CT ANGIOGRAPHY HEAD AND NECK TECHNIQUE: Multidetector CT imaging of the head and neck was performed using the standard protocol during bolus administration of intravenous contrast. Multiplanar CT image reconstructions and MIPs were obtained to evaluate the vascular anatomy. Carotid stenosis measurements (when applicable) are obtained utilizing NASCET criteria, using the distal internal carotid diameter as the denominator. CONTRAST:  ISOVUE-370 IOPAMIDOL (ISOVUE-370) INJECTION 76% COMPARISON:  06/30/2017 CT head. FINDINGS: CTA NECK FINDINGS Aortic arch: Standard branching. Imaged portion shows no evidence of aneurysm or dissection. No significant stenosis of the major arch vessel origins. Right carotid system: No evidence of dissection, stenosis (50% or greater) or occlusion. Left carotid system: No evidence of dissection, stenosis (50% or greater) or occlusion. Vertebral arteries: Codominant. No evidence of dissection, stenosis (50% or greater) or occlusion. Skeleton: Negative. Other neck: Right floor of mouth fat containing structure measuring 14 mm, probably a small lipoma, partially obscured by streak artifact from dental hardware (series 5, image 57). Upper chest: Negative. Review of the MIP images confirms the above  findings CTA HEAD FINDINGS Anterior circulation: No large vessel occlusion, aneurysm, or vascular malformation. Posterior circulation: No large vessel occlusion, aneurysm, or vascular malformation. Multiple segments of stenosis are present in the anterior and posterior circulation best appreciated in the right superior M2, left inferior M2, right P1, left P2 segments. Venous sinuses: As permitted by contrast timing, patent. Anatomic variants: Diminutive right and small left posterior communicating arteries. No anterior communicating artery identified. Delayed phase: No abnormal intracranial enhancement. Review of the MIP images confirms the above findings IMPRESSION: 1. Multiple segments of arterial stenosis in the anterior and posterior circulation which may represent cerebral vasculitis or possibly reversible cerebral vasoconstriction syndrome (RCVS)/PRES given posterior distribution cortical abnormality. 2. No large vessel occlusion, aneurysm, or vascular malformation. 3. Widely patent carotid and vertebral arteries in the neck. These results were called by telephone at the time of interpretation on 06/30/2017 at 11:58 pm to Dr. Dione Booze , who verbally acknowledged these results. Electronically Signed   By: Mitzi Hansen M.D.   On: 07/01/2017 00:05   Mr Brain Wo Contrast  Result Date: 07/01/2017 CLINICAL DATA:  Hypertension and diabetes. Acute presentation with headache and speech disturbance last night. EXAM: MRI HEAD WITHOUT CONTRAST TECHNIQUE: Multiplanar, multiecho pulse sequences of  the brain and surrounding structures were obtained without intravenous contrast. COMPARISON:  CT studies 06/30/2017 FINDINGS: Brain: Diffusion imaging shows a cluster of numerous small acute infarctions in the left posterior temporal and temporoparietal junction region consistent with embolic disease in the left MCA territory. No other region of acute insult. No large confluent infarction. The brainstem and  cerebellum are normal. No mass effect or hemorrhage. The remainder the brain is negative. No mass, hydrocephalus or extra-axial collection. Vascular: Major vessels at the base of the brain show flow. Skull and upper cervical spine: Negative Sinuses/Orbits: Clear/normal Other: None IMPRESSION: Cluster of acute infarctions in the posterior left temporal lobe and temporoparietal junction region consistent with embolic infarctions in the left MCA territory. No other abnormal brain parenchymal finding. Whereas this could represent an extremely localized manifestation of posterior reversible encephalopathy, I think that is less likely. Electronically Signed   By: Paulina Fusi M.D.   On: 07/01/2017 07:36   Ct Head Code Stroke Wo Contrast  Result Date: 06/30/2017 CLINICAL DATA:  Code stroke. 42 y/o F; altered mental status, code stroke. EXAM: CT HEAD WITHOUT CONTRAST TECHNIQUE: Contiguous axial images were obtained from the base of the skull through the vertex without intravenous contrast. COMPARISON:  None. FINDINGS: Brain: Small 14 mm wedge-shaped focus of hypoattenuation within the left parieto-occipital junction (series 2, image 18) which may represent an area of acute or subacute infarction. No hemorrhage or mass effect. No additional area of stroke, hemorrhage, or mass effect identified. No hydrocephalus, effacement of basilar cisterns, or extra-axial collection. Vascular: No hyperdense vessel or unexpected calcification. Skull: Normal. Negative for fracture or focal lesion. Sinuses/Orbits: No acute finding. Other: None. ASPECTS [facility] Stroke Program Early CT Score) - Ganglionic level infarction (caudate, lentiform nuclei, internal capsule, insula, M1-M3 cortex): 7 - Supraganglionic infarction (M4-M6 cortex): 3 Total score (0-10 with 10 being normal): 10 IMPRESSION: 1. Small 14 mm cortical focus of hypoattenuation within left parieto-occipital junction which may represent acute or subacute infarction. No  hemorrhage or mass effect. 2. ASPECTS is 10 These results were called by telephone at the time of interpretation on 06/30/2017 at 11:26 pm to Dr. Dione Booze , who verbally acknowledged these results. Electronically Signed   By: Mitzi Hansen M.D.   On: 06/30/2017 23:29        Scheduled Meds: . amLODipine  10 mg Oral Daily  . aspirin  300 mg Rectal Daily   Or  . aspirin  325 mg Oral Daily  . atorvastatin  80 mg Oral q1800  . insulin aspart  0-9 Units Subcutaneous TID WC  . insulin glargine  10 Units Subcutaneous QHS  . loratadine  10 mg Oral Daily  . LORazepam  1 mg Intravenous Once  . losartan  100 mg Oral Daily   Continuous Infusions: . sodium chloride 100 mL/hr at 07/02/17 0305     LOS: 1 day    Time spent: 40 minutes    Willard Madrigal, Roselind Messier, MD [facility] Pager [phone]   If 7PM-7AM, please contact night-coverage [email or web address] Password [facility] 07/02/2017, 8:50 AM

## 2017-07-02 NOTE — Progress Notes (Signed)
Pt. Sat up to eat lunch, within a few minutes the patient's rt droop returned along with worsening exp aphasia. Dr. Roda ShuttersXu at bedside. Pt. for stat CT.

## 2017-07-02 NOTE — Progress Notes (Signed)
CT Head, neck and perfusion completed - no hmg, no new LVO, no salvageable penumbra.   Ct Angio Head W Or Wo Contrast  Addendum Date: 07/02/2017   ADDENDUM REPORT: 07/02/2017 14:20 ADDENDUM: These results were called by telephone at the time of interpretation on 07/02/2017 at 2:20 pm to Dr. Marvel PlanJINDONG XU , who verbally acknowledged these results. Electronically Signed   By: Mitzi HansenLance  Furusawa-Stratton M.D.   On: 07/02/2017 14:20   Result Date: 07/02/2017 CLINICAL DATA:  42 y/o F; right facial droop and aphasia. Waxing and waning with original symptoms since arrival. EXAM: CT ANGIOGRAPHY HEAD AND NECK CT PERFUSION BRAIN TECHNIQUE: Multidetector CT imaging of the head and neck was performed using the standard protocol during bolus administration of intravenous contrast. Multiplanar CT image reconstructions and MIPs were obtained to evaluate the vascular anatomy. Carotid stenosis measurements (when applicable) are obtained utilizing NASCET criteria, using the distal internal carotid diameter as the denominator. Multiphase CT imaging of the brain was performed following IV bolus contrast injection. Subsequent parametric perfusion maps were calculated using RAPID software. CONTRAST:  90mL ISOVUE-370 IOPAMIDOL (ISOVUE-370) INJECTION 76% COMPARISON:  07/01/2017 CT and MRI head. 06/30/2017 CT head and CTA head and neck. FINDINGS: CTA NECK FINDINGS Aortic arch: Bovine variant branching. Imaged portion shows no evidence of aneurysm or dissection. No significant stenosis of the major arch vessel origins. Right carotid system: No evidence of dissection, stenosis (50% or greater) or occlusion. Left carotid system: No evidence of dissection, stenosis (50% or greater) or occlusion. Vertebral arteries: Codominant. No evidence of dissection, stenosis (50% or greater) or occlusion. Skeleton: Negative. Other neck: Stable right floor of mouth partially visualized fat containing structure measuring approximately 14 mm, probably lipoma.  Upper chest: Negative. Review of the MIP images confirms the above findings CTA HEAD FINDINGS Anterior circulation: Segments of stenosis again seen best appreciated in the right proximal M2 superior division at left proximal M2 inferior division. No large vessel occlusion, aneurysm, or vascular malformation identified. Posterior circulation: Stable segments of stenosis in bilateral proximal PCA. No large vessel occlusion, aneurysm, or vascular malformation identified. Venous sinuses: As permitted by contrast timing, patent. Anatomic variants: Patent circle-of-Willis. Delayed phase: No abnormal intracranial enhancement. Few additional small foci of hypoattenuation are present within the left parietal lobe cortex and subcortical white matter in comparison with original CT, with corresponding areas of reduced diffusion on MR, likely representing areas of infarction (series 14 image 18-21). Review of the MIP images confirms the above findings CT Brain Perfusion Findings: CBF (<30%) Volume: 0mL Perfusion (Tmax>6.0s) volume: 8mL Mismatch Volume: 8mL Infarction Location:Lack of perfusion anomaly in left posterior MCA region of infarction is likely due to pseudonormalization. Region of T-max > 6 seconds (8 cc) is present more superiorly in the adjacent left parietal lobe indicating area of at-risk ischemia. A larger region of T-max > 4 seconds (57 cc) extends into the left posterior frontal lobe and may be present in the right watershed compatible with oligemia. IMPRESSION: CT perfusion: 1. No region of CBF (<30%) to indicate new acute infarction. 2. Areas of infarction of left posterior MCA distribution have pseudonormalized perfusion. 3. Region of T-max > 6 seconds (8 cc) in adjacent left parietal lobe indicating penumbra. 4. Larger region of T-max > 4 seconds (57 cc) extending into left posterior frontal lobe compatible with oligemia. CTA head: 1. Persistent segments of high-grade stenosis and right proximal M2 superior  division and left proximal M2 inferior division as well as segments of mild stenosis and bilateral PCA.  2. No new large vessel occlusion or aneurysm of the intracranial circulation. CTA neck: Patent carotid and vertebral arteries. No dissection, aneurysm, or hemodynamically significant stenosis utilizing NASCET criteria. Electronically Signed: By: Mitzi HansenLance  Furusawa-Stratton M.D. On: 07/02/2017 14:06   Ct Angio Neck W Or Wo Contrast  Addendum Date: 07/02/2017   ADDENDUM REPORT: 07/02/2017 14:20 ADDENDUM: These results were called by telephone at the time of interpretation on 07/02/2017 at 2:20 pm to Dr. Marvel PlanJINDONG XU , who verbally acknowledged these results. Electronically Signed   By: Mitzi HansenLance  Furusawa-Stratton M.D.   On: 07/02/2017 14:20   Result Date: 07/02/2017 CLINICAL DATA:  42 y/o F; right facial droop and aphasia. Waxing and waning with original symptoms since arrival. EXAM: CT ANGIOGRAPHY HEAD AND NECK CT PERFUSION BRAIN TECHNIQUE: Multidetector CT imaging of the head and neck was performed using the standard protocol during bolus administration of intravenous contrast. Multiplanar CT image reconstructions and MIPs were obtained to evaluate the vascular anatomy. Carotid stenosis measurements (when applicable) are obtained utilizing NASCET criteria, using the distal internal carotid diameter as the denominator. Multiphase CT imaging of the brain was performed following IV bolus contrast injection. Subsequent parametric perfusion maps were calculated using RAPID software. CONTRAST:  90mL ISOVUE-370 IOPAMIDOL (ISOVUE-370) INJECTION 76% COMPARISON:  07/01/2017 CT and MRI head. 06/30/2017 CT head and CTA head and neck. FINDINGS: CTA NECK FINDINGS Aortic arch: Bovine variant branching. Imaged portion shows no evidence of aneurysm or dissection. No significant stenosis of the major arch vessel origins. Right carotid system: No evidence of dissection, stenosis (50% or greater) or occlusion. Left carotid system: No  evidence of dissection, stenosis (50% or greater) or occlusion. Vertebral arteries: Codominant. No evidence of dissection, stenosis (50% or greater) or occlusion. Skeleton: Negative. Other neck: Stable right floor of mouth partially visualized fat containing structure measuring approximately 14 mm, probably lipoma. Upper chest: Negative. Review of the MIP images confirms the above findings CTA HEAD FINDINGS Anterior circulation: Segments of stenosis again seen best appreciated in the right proximal M2 superior division at left proximal M2 inferior division. No large vessel occlusion, aneurysm, or vascular malformation identified. Posterior circulation: Stable segments of stenosis in bilateral proximal PCA. No large vessel occlusion, aneurysm, or vascular malformation identified. Venous sinuses: As permitted by contrast timing, patent. Anatomic variants: Patent circle-of-Willis. Delayed phase: No abnormal intracranial enhancement. Few additional small foci of hypoattenuation are present within the left parietal lobe cortex and subcortical white matter in comparison with original CT, with corresponding areas of reduced diffusion on MR, likely representing areas of infarction (series 14 image 18-21). Review of the MIP images confirms the above findings CT Brain Perfusion Findings: CBF (<30%) Volume: 0mL Perfusion (Tmax>6.0s) volume: 8mL Mismatch Volume: 8mL Infarction Location:Lack of perfusion anomaly in left posterior MCA region of infarction is likely due to pseudonormalization. Region of T-max > 6 seconds (8 cc) is present more superiorly in the adjacent left parietal lobe indicating area of at-risk ischemia. A larger region of T-max > 4 seconds (57 cc) extends into the left posterior frontal lobe and may be present in the right watershed compatible with oligemia. IMPRESSION: CT perfusion: 1. No region of CBF (<30%) to indicate new acute infarction. 2. Areas of infarction of left posterior MCA distribution have  pseudonormalized perfusion. 3. Region of T-max > 6 seconds (8 cc) in adjacent left parietal lobe indicating penumbra. 4. Larger region of T-max > 4 seconds (57 cc) extending into left posterior frontal lobe compatible with oligemia. CTA head: 1. Persistent segments of  high-grade stenosis and right proximal M2 superior division and left proximal M2 inferior division as well as segments of mild stenosis and bilateral PCA. 2. No new large vessel occlusion or aneurysm of the intracranial circulation. CTA neck: Patent carotid and vertebral arteries. No dissection, aneurysm, or hemodynamically significant stenosis utilizing NASCET criteria. Electronically Signed: By: Mitzi Hansen M.D. On: 07/02/2017 14:06   Ct Cerebral Perfusion W Contrast  Addendum Date: 07/02/2017   ADDENDUM REPORT: 07/02/2017 14:20 ADDENDUM: These results were called by telephone at the time of interpretation on 07/02/2017 at 2:20 pm to Dr. Marvel Plan , who verbally acknowledged these results. Electronically Signed   By: Mitzi Hansen M.D.   On: 07/02/2017 14:20   Result Date: 07/02/2017 CLINICAL DATA:  42 y/o F; right facial droop and aphasia. Waxing and waning with original symptoms since arrival. EXAM: CT ANGIOGRAPHY HEAD AND NECK CT PERFUSION BRAIN TECHNIQUE: Multidetector CT imaging of the head and neck was performed using the standard protocol during bolus administration of intravenous contrast. Multiplanar CT image reconstructions and MIPs were obtained to evaluate the vascular anatomy. Carotid stenosis measurements (when applicable) are obtained utilizing NASCET criteria, using the distal internal carotid diameter as the denominator. Multiphase CT imaging of the brain was performed following IV bolus contrast injection. Subsequent parametric perfusion maps were calculated using RAPID software. CONTRAST:  90mL ISOVUE-370 IOPAMIDOL (ISOVUE-370) INJECTION 76% COMPARISON:  07/01/2017 CT and MRI head. 06/30/2017 CT head  and CTA head and neck. FINDINGS: CTA NECK FINDINGS Aortic arch: Bovine variant branching. Imaged portion shows no evidence of aneurysm or dissection. No significant stenosis of the major arch vessel origins. Right carotid system: No evidence of dissection, stenosis (50% or greater) or occlusion. Left carotid system: No evidence of dissection, stenosis (50% or greater) or occlusion. Vertebral arteries: Codominant. No evidence of dissection, stenosis (50% or greater) or occlusion. Skeleton: Negative. Other neck: Stable right floor of mouth partially visualized fat containing structure measuring approximately 14 mm, probably lipoma. Upper chest: Negative. Review of the MIP images confirms the above findings CTA HEAD FINDINGS Anterior circulation: Segments of stenosis again seen best appreciated in the right proximal M2 superior division at left proximal M2 inferior division. No large vessel occlusion, aneurysm, or vascular malformation identified. Posterior circulation: Stable segments of stenosis in bilateral proximal PCA. No large vessel occlusion, aneurysm, or vascular malformation identified. Venous sinuses: As permitted by contrast timing, patent. Anatomic variants: Patent circle-of-Willis. Delayed phase: No abnormal intracranial enhancement. Few additional small foci of hypoattenuation are present within the left parietal lobe cortex and subcortical white matter in comparison with original CT, with corresponding areas of reduced diffusion on MR, likely representing areas of infarction (series 14 image 18-21). Review of the MIP images confirms the above findings CT Brain Perfusion Findings: CBF (<30%) Volume: 0mL Perfusion (Tmax>6.0s) volume: 8mL Mismatch Volume: 8mL Infarction Location:Lack of perfusion anomaly in left posterior MCA region of infarction is likely due to pseudonormalization. Region of T-max > 6 seconds (8 cc) is present more superiorly in the adjacent left parietal lobe indicating area of at-risk  ischemia. A larger region of T-max > 4 seconds (57 cc) extends into the left posterior frontal lobe and may be present in the right watershed compatible with oligemia. IMPRESSION: CT perfusion: 1. No region of CBF (<30%) to indicate new acute infarction. 2. Areas of infarction of left posterior MCA distribution have pseudonormalized perfusion. 3. Region of T-max > 6 seconds (8 cc) in adjacent left parietal lobe indicating penumbra. 4. Larger region  of T-max > 4 seconds (57 cc) extending into left posterior frontal lobe compatible with oligemia. CTA head: 1. Persistent segments of high-grade stenosis and right proximal M2 superior division and left proximal M2 inferior division as well as segments of mild stenosis and bilateral PCA. 2. No new large vessel occlusion or aneurysm of the intracranial circulation. CTA neck: Patent carotid and vertebral arteries. No dissection, aneurysm, or hemodynamically significant stenosis utilizing NASCET criteria. Electronically Signed: By: Mitzi Hansen M.D. On: 07/02/2017 14:06    Again, sx improved when pt laid flat, worsened when elevated post imaging. Will load with plavix and give albumin to help with volume in order to hopefully prevent symptom recurrence.   Initially passed swallow and tolerated POs. Unable to repeat swallow due to inability to sit up, however, symptoms (other than speech hesitance and occasional expressive aphasia) essentially resolved. Discussed with Dr. Roda Shutters and Victorino Dike RN. Will give plavix po without repeating swallow due to risk of worsening with sitting up. If cough/aspirates, will stop. Will avoid NG  For medication administration purposes.3  Continue to Keep in bed with [facility] flat   Rhoderick Moody [facility] See Amion for Pager information 07/02/2017 3:33 PM

## 2017-07-02 NOTE — Progress Notes (Signed)
RN paged because pt had a 4 min episode of slurred speech which resolved. Pt worried. NP reviewed chart and called neuro, Dr. Wilford CornerArora, to discuss case and whether pt needed further anti platelet agent at this time.. Dr. Wilford CornerArora stated ASA was fine for now as we do not know the exact origin of the stroke and she also has a PRES, vasospasm, and vasculitis appearance. NP called RN and told her to reassure pt that NP had spoken with neuro and the treatment plan was appropriate for now and stroke team would f/up in the am. Waxing and waning sx could be attributed to the spasms/vasculitis issue.  KJKG, NP [facility]

## 2017-07-02 NOTE — Progress Notes (Signed)
07/01/17  @ 22:30pm Paged Maren ReamerKaren Kirby, NP on call for pt c/o twisting feeling of rt side of mouth for 1 minute then was resolved and notified again at 00:30am for pt complained of slurred speech for  4 minutes that was resolved also. Ordered just continue to monitor and reassure pt and family at bedside.

## 2017-07-02 NOTE — Progress Notes (Signed)
Assisted the patient to the bathroom and on the way back pt. developed right droop and worsening expressive aphasia. Pt. Placed in bed and laid flat. Annie MainSharon Biby, NP on unit and made aware.

## 2017-07-02 NOTE — Progress Notes (Signed)
Pt. C/o rt hand numbness which is a new symptom for today. Resolved ~ 20 minutes later. Speech remains worse since this morning's assessment. Also noted pt. Had 600cc urine out since 1330. Dr. Roda ShuttersXu made aware, no further orders received.

## 2017-07-03 ENCOUNTER — Inpatient Hospital Stay (HOSPITAL_COMMUNITY): Payer: BC Managed Care – PPO

## 2017-07-03 DIAGNOSIS — E1169 Type 2 diabetes mellitus with other specified complication: Secondary | ICD-10-CM

## 2017-07-03 DIAGNOSIS — E785 Hyperlipidemia, unspecified: Secondary | ICD-10-CM

## 2017-07-03 LAB — GLUCOSE, CAPILLARY
GLUCOSE-CAPILLARY: 144 mg/dL — AB (ref 65–99)
GLUCOSE-CAPILLARY: 230 mg/dL — AB (ref 65–99)
GLUCOSE-CAPILLARY: 211 mg/dL — AB (ref 65–99)
Glucose-Capillary: 205 mg/dL — ABNORMAL HIGH (ref 65–99)

## 2017-07-03 LAB — CBC
HCT: 41.6 % (ref 36.0–46.0)
HEMOGLOBIN: 13.3 g/dL (ref 12.0–15.0)
MCH: 26.9 pg (ref 26.0–34.0)
MCHC: 32 g/dL (ref 30.0–36.0)
MCV: 84.2 fL (ref 78.0–100.0)
PLATELETS: 282 10*3/uL (ref 150–400)
RBC: 4.94 MIL/uL (ref 3.87–5.11)
RDW: 13 % (ref 11.5–15.5)
WBC: 9.6 10*3/uL (ref 4.0–10.5)

## 2017-07-03 LAB — MPO/PR-3 (ANCA) ANTIBODIES
ANCA Proteinase 3: <3.5 U/mL (ref 0.0–3.5)
Myeloperoxidase Abs: <9 U/mL (ref 0.0–9.0)

## 2017-07-03 LAB — BASIC METABOLIC PANEL
ANION GAP: 11 (ref 5–15)
BUN: <5 mg/dL — ABNORMAL LOW (ref 6–20)
CO2: 20 mmol/L — ABNORMAL LOW (ref 22–32)
Calcium: 8.2 mg/dL — ABNORMAL LOW (ref 8.9–10.3)
Chloride: 106 mmol/L (ref 101–111)
Creatinine, Ser: 0.77 mg/dL (ref 0.44–1.00)
GFR calc Af Amer: >60 mL/min (ref >60)
Glucose, Bld: 184 mg/dL — ABNORMAL HIGH (ref 65–99)
POTASSIUM: 3.4 mmol/L — AB (ref 3.5–5.1)
SODIUM: 137 mmol/L (ref 135–145)

## 2017-07-03 LAB — ECHOCARDIOGRAM COMPLETE
Height: 68 in
WEIGHTICAEL: 5232 [oz_av]

## 2017-07-03 LAB — BETA-2-GLYCOPROTEIN I ABS, IGG/M/A
Beta-2-Glycoprotein I IgA: <9 GPI IgA units (ref 0–25)
Beta-2-Glycoprotein I IgM: <9 GPI IgM units (ref 0–32)

## 2017-07-03 LAB — MAGNESIUM: MAGNESIUM: 1.7 mg/dL (ref 1.7–2.4)

## 2017-07-03 MED ORDER — INSULIN GLARGINE 100 UNIT/ML ~~LOC~~ SOLN
15.0000 [IU] | Freq: Every day | SUBCUTANEOUS | Status: DC
  Administered 2017-07-03 – 2017-07-08 (×6): 15 [IU] via SUBCUTANEOUS
  Filled 2017-07-03 (×7): qty 0.15

## 2017-07-03 MED ORDER — WHITE PETROLATUM EX OINT
TOPICAL_OINTMENT | CUTANEOUS | Status: AC
  Filled 2017-07-03: qty 28.35

## 2017-07-03 NOTE — Progress Notes (Addendum)
STROKE TEAM PROGRESS NOTE   SUBJECTIVE (INTERVAL HISTORY) Her brother is at the beside since 9p last night. They both report no additional events over night. She remained flat during the night. This am her HOB was elevated without neuro worsening. While expressive and receptive aphasia is worse today than yesterday, she is able to communicate main thoughts with relatively good understanding. No other new findings.    OBJECTIVE Vitals:   07/02/17 1955 07/02/17 2352 07/03/17 0321 07/03/17 0835  BP: (!) 186/81 (!) 182/71 (!) 163/79 (!) 182/82  Pulse: 78 81 81 68  Resp: _0 Temp: 98.6 F (37 C) 98.8 F (37.1 C) 99 F (37.2 C) 98.4 F (36.9 C)  TempSrc: Oral Oral Oral Oral  SpO2: 100% 100% 100% 96%  Weight:      Height:        CBC:  Recent Labs  Lab 06/30/17 2325 06/30/17 2331  WBC 9.3  --   NEUTROABS 6.2  --   HGB 14.3 15.6*  HCT 44.4 46.0  MCV 84.4  --   PLT 325  --     Basic Metabolic Panel:  Recent Labs  Lab 06/30/17 2325 06/30/17 2331  NA 140 142  K 3.5 3.5  CL 107 106  CO2 25  --   GLUCOSE 206* 208*  BUN 6 4*  CREATININE 0.91 0.90  CALCIUM 8.7*  --   MG 1.8  --     Lipid Panel:     Component Value Date/Time   CHOL 168 07/01/2017 0800   TRIG 60 07/01/2017 0800   HDL 27 (L) 07/01/2017 0800   CHOLHDL 6.2 07/01/2017 0800   VLDL 12 07/01/2017 0800   LDLCALC 129 (H) 07/01/2017 0800   HgbA1c:  Lab Results  Component Value Date   HGBA1C 8.5 (H) 07/01/2017   Urine Drug Screen:     Component Value Date/Time   LABOPIA NONE DETECTED 07/02/2017 0254   COCAINSCRNUR NONE DETECTED 07/02/2017 0254   LABBENZ NONE DETECTED 07/02/2017 0254   AMPHETMU NONE DETECTED 07/02/2017 0254   THCU NONE DETECTED 07/02/2017 0254   LABBARB NONE DETECTED 07/02/2017 0254    Alcohol Level No results found for: [place] Code Stroke Wo Contrast 06/30/2017 2329  1. Small 14 mm cortical focus of hypoattenuation within left parieto-occipital junction which  may represent acute or subacute infarction. No hemorrhage or mass effect. 2. ASPECTS is 10   Ct Angio Head W Or Wo Contrast Ct Angio Neck W Or Wo Contrast 07/01/2017  1. Multiple segments of arterial stenosis in the anterior and posterior circulation which may represent cerebral vasculitis or possibly reversible cerebral vasoconstriction syndrome (RCVS)/PRES given posterior distribution cortical abnormality. 2. No large vessel occlusion, aneurysm, or vascular malformation. 3. Widely patent carotid and vertebral arteries in the neck.   Ct Head Wo Contrast 07/01/2017 Stable infarct in the medial left occipital lobe. No new area of decreased attenuation evident by noncontrast enhanced CT. Note the noncontrast enhanced CT from 1 day prior as well as the current examination demonstrates less extensive infarct than is demonstrated on MR obtained earlier in the day. No hemorrhage or mass evident.   Mr Brain Wo Contrast 07/01/2017 0736 Cluster of acute infarctions in the posterior left temporal lobe and temporoparietal junction region consistent with embolic infarctions in the left MCA territory. No other abnormal brain parenchymal finding. Whereas this could represent an extremely localized manifestation of posterior reversible encephalopathy, I think that is less likely.  Ct Angio Head W Or Wo Contrast 07/02/2017 1. Persistent segments of high-grade stenosis and right proximal M2 superior division and left proximal M2 inferior division as well as segments of mild stenosis and bilateral PCA. 2. No new large vessel occlusion or aneurysm of the intracranial circulation.   Ct Angio Neck W Or Wo Contrast 07/02/2017 Patent carotid and vertebral arteries. No dissection, aneurysm, or hemodynamically significant stenosis utilizing NASCET criteria.   Ct Cerebral Perfusion W Contrast 07/02/2017 1. No region of CBF (<30%) to indicate new acute infarction. 2. Areas of infarction of left posterior MCA distribution  have pseudonormalized perfusion. 3. Region of T-max > 6 seconds (8 cc) in adjacent left parietal lobe indicating penumbra. 4. Larger region of T-max > 4 seconds (57 cc) extending into left posterior frontal lobe compatible with oligemia.     GENERAL EXAM: Patient is in no distress. Lying in bed with HOB at 30 degrees.  CARDIOVASCULAR: Regular rate and rhythm, no murmurs, no carotid bruits. Lungs clear.  NEUROLOGIC: MENTAL STATUS: awake, alert, speech hesitant with mild expressive aphasia with trouble naming and paraphasic errors. Comprehension mostly accurate, can follow 1&2 step commands well but difficulty with 3 step commands.  CRANIAL NERVE: decreased ability to count fingers RLQ but recognizes movement. extraocular muscles intact, no nystagmus, R lower facial weakness from yesterday resolved. facial sensation symmetric, no tongue deviation. MOTOR: normal bulk and tone, full strength on the Left. Proximal strength on R normal. Decreased FMM on the R with left arm orbiting right.  SENSORY: normal and symmetric to light touch, temperature. Sensation intact; denies finger numbness COORDINATION: finger-nose-finger, fine finger movements, heel-shin normal GAIT/STATION: deferred.   ASSESSMENT/PLAN [name] is a 42 y.o. female with history of HTN, type 2 DM, obesity, endometriosis/ovarian cyst s/p hysterectomy/SBO and seasonal allergies presenting initially to [facility] with slurred speech and expressive aphasia. She did not receive IV t-PA due to NIH 0.   Stroke:   Left posterior temporal lobe infarcts L MCA territory in setting of uncontrolled risk factors and severe bilateral M2 intracranial stenosis, infarcts felt to be secondary to large vessel disease source  Resultant  Mild expressive and receptive aphasia, mild R hand clumsiness  Symptoms have remained stable over night   CT head Small 14 mm cortical focus of hypoattenuation within left parieto-occipital junction possible acute  infarct, ASPECTS 10  CTA H&N on admission Multiple anterior and posterior stenosis, no ELVO  Repeat CT head w/ worsening stable left medial occipital lobe infarct, no new abnormalities.   MRI head cluster of acute infarct posterior left temporal lobe and temporoparietal junction region in the L MCA territory  Repeat CTA head, neck unchanged bilateram high grade M2 stenosis.   CTP with small perfusion deficit, not amenable to intervention  Repeat MRI pending   2D Echo  pending   Consider cerebral angiogram once stable to assess stenosis  LDL 8.5  HgbA1c 12.9  SCDs for VTE prophylaxis  Diet Carb Modified Fluid consistency: Thin;   No antithrombotic prior to admission, now on aspirin 325 mg daily and clopidogrel 75 mg daily following plavix load yesterday. Given large vessel disease, anticipate plavix and aspirin x 3 months at time of discharge  Added albumin 5 x 4 doses yesterday for volume  Ongoing aggressive stroke risk factor management  At this time, do not suspect RCVS or cerebral vasculitis (received empiric pulse dose steroids) - f/u labs (ESR 13, RA latex turbid 10.5, ANCA ab pending, ANCA titers neg, RF 10.5,  anti-jo a ab pending, extractable nuclear antigen ab neg, CRP 1.0, CL ab w/ IGM 15 (indeterminate), PT gene mut pending, Factor 5 leiden pending, homo neg, B2GP neg, Lupus AC neg, Prot C&S neg, anti III  85  Therapy recommendations:  Pending. Ok to get OOB  Disposition:  Anticipate return home w/ family (pt works as a Company secretary), await therapy evals  Hypertension  Elevated but Stable, 170s Permissive hypertension (OK if < 220/120) but gradually normalize in 5-7 days Ok to continue home antihypertensives Long-term BP goal normotensive  Hyperlipidemia  Home meds:  None  Now on Lipitor 80  LDL 129, goal < 70  Continue statin at discharge  Diabetes  HgbA1c 8.5, goal < 7.0  Uncontrolled  CBGs elevated  Other Stroke Risk Factors  Morbid Obesity,  Body mass index is 49.72 kg/m., recommend weight loss, diet and exercise as appropriate. Pt s/p lap band surgery 5 years ago   UDS level neg  Home norethindrone d/c'd on admission  Family hx stroke (father at age 19, L brain stroke with R HP and severe expressive aphasia)  No hx Migraines  Consider OP eval for obstructive sleep apnea given morbid obesity  Hospital day # [place] for Pager information 07/03/2017 10:03 AM   ATTENDING NOTE: I reviewed above note and agree with the assessment and plan. I have made any additions or clarifications directly to the above note. Pt was seen and examined.   42 year old female with history hypertension, diabetes, obesity admitted for episode of aphasia, slow thought processing and right hand numbness as well as right temporal headache.  Symptom resolved on admission.  BP elevated.  CT [place] for left occipital stroke.  CTA of the neck showed bilateral M2 stenosis, questionable for our CVS or intracranial stenosis.  MRI showed left inferior MCA patchy small infarcts.  LDL 129 and A1c 8.5.  UDS negative.  Currently on aspirin 325 and Lipitor 80.   However, symptoms fluctuate with expressive aphasia and slurred speech on sitting up yesterday.  repeat CTA head and neck continue to show b/l M1 and M2 stenosis, as well as b/l PCA stenosis. MRI repeat showed extension of infarct at left MCA territory. Had IVF and albumin over night and BP stable at high end. Today no more fluctuation of symptoms even with sitting up, standing up, and working with PT/OT in hallway.   Pt condition still more likely advanced atherosclerosis intracranially given risk factors including morbid obesity, HTN, HLD, and DM. However, vasculitis still in DDx. Not able to do LP due to plavix use. Will consider next week cerebral angio test. Continue DAPT and high dose lipitor.  Rosalin Hawking, MD PhD Stroke Neurology 07/03/2017 11:20 PM   To  contact Stroke Continuity provider, please refer to [email or web address] After hours, contact General Neurology

## 2017-07-03 NOTE — Progress Notes (Signed)
  Echocardiogram 2D Echocardiogram has been performed.  Debra SavoyCasey N Lacole [name] 07/03/2017, 10:53 AM

## 2017-07-03 NOTE — Progress Notes (Signed)
PROGRESS NOTE    Debra [name]  ZOX:[id number] DOB: 12-15-1975 DOA: 06/30/2017 PCP: Myrlene Broker, MD   Brief Narrative:  42 y.o. BF PMHx Asthma, Diabetes mellitus type II, Endometriosis, HTN, DJD, post traumatic with repeat falls, Morbid obesity S/P lab band 09/2010, Ovarian cyst, right s/p resection June 2013  Started to experience headache with difficulty speaking around 10 PM last night.  Patient headache was in the right temporal area just pounding in nature and severe with no associated visual symptoms.  Patient found it difficult to understand things and speak.  Patient herself drove to the ER.  Denies any weakness of the extremities.  Denies any difficulty swallowing.  Has not had any routine headaches.  Patient had mild tingling of the first 2 fingers in the right hand.   ED Course: The ER patient blood pressure was more than 200 systolic.  CT angiogram of the neck was done which did not show any large vessel occlusion but features were concerning for cerebral vasculitis versus PRES versus RCVS.  Tele neurology was consulted and this time they felt that patient does not have acute stroke.  Patient was given hydralazine followed by diphenhydramine and Compazine hand patient's headache and symptoms largely improved.  Patient is being admitted for further management of possible hypertensive encephalopathy versus TIA and on-call neurologist at [facility] has been alerted.  On my exam patient appears nonfocal.  States the headache is largely resolved.    Subjective: 3/14 A/O 4, negative CP, negative SOB, negative abdominal pain, negative N/V, Ambulated to bathroom and back without assistance.     Assessment & Plan:   Principal Problem:   TIA (transient ischemic attack) Active Problems:   Diabetes mellitus type 2, uncontrolled (HCC)   Asthma   Hypertensive urgency   Hypertensive encephalopathy   Acute embolic stroke (HCC)  Acute LEFT Temporal lobe CVA LEFT MCA  territory -permit permissive HTN -echocardiogram pending -3/12 Hemoglobin A1C= 8.5 -ASA 325 mg -Plavix 300 mg--> 75 mg daily -Albumin load per neurology -PT/OT consulted; no follow-up required -on Monday plan for CT angiogram to further evaluate extension of stroke, per stroke team.  Diabetes type 2 uncontrolled complication -3/12 Hemoglobin A1c= 8.5 -Hemoglobin A1c goal< 7 -3/14 increase lantus 15 units daily -sensitive SSI  HLD -Lipid panel not within ADA guidelines -LDL goal< 70 -8/12 Lipitor 80 mg daily  Hypertensive urgency -Allow for permissive hypertension secondary to acute CVA -SBP goal 160-190 -Over next 5-7 days will gradually decrease to normal range  Headache -Currently asymptomatic    DVT prophylaxis: SCD Code Status: full Family Communication: mother at bedside for discussion of plan of care Disposition Plan: TBD   Consultants:  Stroke team    Procedures/Significant Events:  3/11 CTA head and neck:Multiple segments of arterial stenosis in the anterior and posterior circulation which may represent cerebral vasculitis or possibly reversible cerebral vasoconstriction syndrome (RCVS)/PRES given posterior distribution cortical abnormality. 2. No large vessel occlusion, aneurysm, or vascular malformation. 3. Widely patent carotid and vertebral arteries in the neck. 3/12MRI brain wo contrast:Cluster of acute infarctions in the posterior left temporal lobe and temporoparietal junction region consistent with embolic infarctions in the left MCA territory. No other abnormal brain parenchymal finding. Whereas this could represent an extremely localized manifestation of posterior reversible encephalopathy, I think that is less likely. 3/12CT head WO contrast:Stable infarct in the medial left occipital lobe. No new area of decreased attenuation evident by noncontrast enhanced CT. Note the noncontrast enhanced CT from 1 day  prior as well as the  current examination demonstrates less extensive infarct than is demonstrated on MR obtained earlier in the day. No hemorrhage or mass evident.     I have personally reviewed and interpreted all radiology studies and my findings are as above.  VENTILATOR SETTINGS:    Cultures   Antimicrobials: Anti-infectives (From admission, onward)   None       Devices    LINES / TUBES:      Continuous Infusions: . sodium chloride 100 mL/hr at 07/03/17 0256  . albumin human       Objective: Vitals:   07/02/17 1955 07/02/17 2352 07/03/17 0321 07/03/17 0835  BP: (!) 186/81 (!) 182/71 (!) 163/79 (!) 182/82  Pulse: 78 81 81 68  Resp: 20  20 18   Temp: 98.6 F (37 C) 98.8 F (37.1 C) 99 F (37.2 C) 98.4 F (36.9 C)  TempSrc: Oral Oral Oral Oral  SpO2: 100% 100% 100% 96%  Weight:      Height:        Intake/Output Summary (Last 24 hours) at 07/03/2017 0853 Last data filed at 07/03/2017 0700 Gross per 24 hour  Intake 3623.33 ml  Output 3500 ml  Net 123.33 ml   Filed Weights   06/30/17 2301  Weight: (!) 327 lb (148.3 kg)    Physical Exam:  General: A/O 4 No acute respiratory distress Neck:  Negative scars, masses, torticollis, lymphadenopathy, JVD Lungs: Clear to auscultation bilaterally without wheezes or crackles Cardiovascular: Regular rate and rhythm without murmur gallop or rub normal S1 and S2 Abdomen: morbidly obese, negative abdominal pain, nondistended, positive soft, bowel sounds, no rebound, no ascites, no appreciable mass Extremities: No significant cyanosis, clubbing, or edema bilateral lower extremities Skin: Negative rashes, lesions, ulcers Psychiatric:  Negative depression, negative anxiety, negative fatigue, negative mania  Central nervous system:  Cranial nerves II through XII intact, tongue/uvula midline, all extremities muscle strength 5/5, sensation intact throughout, finger nose finger bilateral within normal limits, quick finger touch bilateral  within normal limits, negative dysarthria, positive expressive aphasia, negative receptive aphasia.  .     Data Reviewed: Care during the described time interval was provided by me .  I have reviewed this patient's available data, including medical history, events of note, physical examination, and all test results as part of my evaluation.   CBC: Recent Labs  Lab 06/30/17 2325 06/30/17 2331  WBC 9.3  --   NEUTROABS 6.2  --   HGB 14.3 15.6*  HCT 44.4 46.0  MCV 84.4  --   PLT 325  --    Basic Metabolic Panel: Recent Labs  Lab 06/30/17 2325 06/30/17 2331  NA 140 142  K 3.5 3.5  CL 107 106  CO2 25  --   GLUCOSE 206* 208*  BUN 6 4*  CREATININE 0.91 0.90  CALCIUM 8.7*  --   MG 1.8  --    GFR: Estimated Creatinine Clearance: 124.3 mL/min (by C-G formula based on SCr of 0.9 mg/dL). Liver Function Tests: Recent Labs  Lab 06/30/17 2325  AST 16  ALT 18  ALKPHOS 60  BILITOT 0.4  PROT 7.1  ALBUMIN 3.6   No results for input(s): LIPASE, AMYLASE in the last 168 hours. No results for input(s): AMMONIA in the last 168 hours. Coagulation Profile: Recent Labs  Lab 06/30/17 2325  INR 1.06   Cardiac Enzymes: No results for input(s): CKTOTAL, CKMB, CKMBINDEX, TROPONINI in the last 168 hours. BNP (last 3 results) No results for  input(s): PROBNP in the last 8760 hours. HbA1C: Recent Labs    07/01/17 0800  HGBA1C 8.5*   CBG: Recent Labs  Lab 07/02/17 0626 07/02/17 1215 07/02/17 1720 07/02/17 2131 07/03/17 0648  GLUCAP 201* 205* 174* 163* 144*   Lipid Profile: Recent Labs    07/01/17 0800  CHOL 168  HDL 27*  LDLCALC 129*  TRIG 60  CHOLHDL 6.2   Thyroid Function Tests: No results for input(s): TSH, T4TOTAL, FREET4, T3FREE, THYROIDAB in the last 72 hours. Anemia Panel: No results for input(s): VITAMINB12, FOLATE, FERRITIN, TIBC, IRON, RETICCTPCT in the last 72 hours. Urine analysis:    Component Value Date/Time   COLORURINE YELLOW 04/20/2016 0305    APPEARANCEUR CLEAR 04/20/2016 0305   LABSPEC 1.024 04/20/2016 0305   PHURINE 5.0 04/20/2016 0305   GLUCOSEU 150 (A) 04/20/2016 0305   GLUCOSEU NEGATIVE 04/05/2013 1003   HGBUR SMALL (A) 04/20/2016 0305   BILIRUBINUR NEGATIVE 04/20/2016 0305   KETONESUR 20 (A) 04/20/2016 0305   PROTEINUR NEGATIVE 04/20/2016 0305   UROBILINOGEN 0.2 02/10/2015 0751   NITRITE NEGATIVE 04/20/2016 0305   LEUKOCYTESUR MODERATE (A) 04/20/2016 0305   Sepsis Labs: @LABRCNTIP (procalcitonin:4,lacticidven:4)  )No results found for this or any previous visit (from the past 240 hour(s)).       Radiology Studies: Ct Angio Head W Or Wo Contrast  Addendum Date: 07/02/2017   ADDENDUM REPORT: 07/02/2017 14:20 ADDENDUM: These results were called by telephone at the time of interpretation on 07/02/2017 at 2:20 pm to Dr. Marvel Plan , who verbally acknowledged these results. Electronically Signed   By: Mitzi Hansen M.D.   On: 07/02/2017 14:20   Result Date: 07/02/2017 CLINICAL DATA:  42 y/o F; right facial droop and aphasia. Waxing and waning with original symptoms since arrival. EXAM: CT ANGIOGRAPHY HEAD AND NECK CT PERFUSION BRAIN TECHNIQUE: Multidetector CT imaging of the head and neck was performed using the standard protocol during bolus administration of intravenous contrast. Multiplanar CT image reconstructions and MIPs were obtained to evaluate the vascular anatomy. Carotid stenosis measurements (when applicable) are obtained utilizing NASCET criteria, using the distal internal carotid diameter as the denominator. Multiphase CT imaging of the brain was performed following IV bolus contrast injection. Subsequent parametric perfusion maps were calculated using RAPID software. CONTRAST:  90mL ISOVUE-370 IOPAMIDOL (ISOVUE-370) INJECTION 76% COMPARISON:  07/01/2017 CT and MRI head. 06/30/2017 CT head and CTA head and neck. FINDINGS: CTA NECK FINDINGS Aortic arch: Bovine variant branching. Imaged portion shows no  evidence of aneurysm or dissection. No significant stenosis of the major arch vessel origins. Right carotid system: No evidence of dissection, stenosis (50% or greater) or occlusion. Left carotid system: No evidence of dissection, stenosis (50% or greater) or occlusion. Vertebral arteries: Codominant. No evidence of dissection, stenosis (50% or greater) or occlusion. Skeleton: Negative. Other neck: Stable right floor of mouth partially visualized fat containing structure measuring approximately 14 mm, probably lipoma. Upper chest: Negative. Review of the MIP images confirms the above findings CTA HEAD FINDINGS Anterior circulation: Segments of stenosis again seen best appreciated in the right proximal M2 superior division at left proximal M2 inferior division. No large vessel occlusion, aneurysm, or vascular malformation identified. Posterior circulation: Stable segments of stenosis in bilateral proximal PCA. No large vessel occlusion, aneurysm, or vascular malformation identified. Venous sinuses: As permitted by contrast timing, patent. Anatomic variants: Patent circle-of-Willis. Delayed phase: No abnormal intracranial enhancement. Few additional small foci of hypoattenuation are present within the left parietal lobe cortex and subcortical white matter  in comparison with original CT, with corresponding areas of reduced diffusion on MR, likely representing areas of infarction (series 14 image 18-21). Review of the MIP images confirms the above findings CT Brain Perfusion Findings: CBF (<30%) Volume: 0mL Perfusion (Tmax>6.0s) volume: 8mL Mismatch Volume: 8mL Infarction Location:Lack of perfusion anomaly in left posterior MCA region of infarction is likely due to pseudonormalization. Region of T-max > 6 seconds (8 cc) is present more superiorly in the adjacent left parietal lobe indicating area of at-risk ischemia. A larger region of T-max > 4 seconds (57 cc) extends into the left posterior frontal lobe and may be  present in the right watershed compatible with oligemia. IMPRESSION: CT perfusion: 1. No region of CBF (<30%) to indicate new acute infarction. 2. Areas of infarction of left posterior MCA distribution have pseudonormalized perfusion. 3. Region of T-max > 6 seconds (8 cc) in adjacent left parietal lobe indicating penumbra. 4. Larger region of T-max > 4 seconds (57 cc) extending into left posterior frontal lobe compatible with oligemia. CTA head: 1. Persistent segments of high-grade stenosis and right proximal M2 superior division and left proximal M2 inferior division as well as segments of mild stenosis and bilateral PCA. 2. No new large vessel occlusion or aneurysm of the intracranial circulation. CTA neck: Patent carotid and vertebral arteries. No dissection, aneurysm, or hemodynamically significant stenosis utilizing NASCET criteria. Electronically Signed: By: Mitzi Hansen M.D. On: 07/02/2017 14:06   Ct Head Wo Contrast  Result Date: 07/01/2017 CLINICAL DATA:  Transient slurred speech EXAM: CT HEAD WITHOUT CONTRAST TECHNIQUE: Contiguous axial images were obtained from the base of the skull through the vertex without intravenous contrast. COMPARISON:  Head CT June 30, 2017 and brain MRI July 01, 2017 FINDINGS: Brain: There is a stable area of decreased attenuation in the medial left occipital lobe consistent with recent infarct in this area, confirmed on diffusion MR imaging. By CT, no new focus of decreased attenuation evident. No evident mass, hemorrhage, extra-axial fluid collection, or midline shift. Vascular: No hyperdense vessel evident. No vascular calcification evident. Skull: Bony calvarium appears intact. Sinuses/Orbits: Visualized paranasal sinuses are clear. Visualized orbits appear symmetric bilaterally. Other: Mastoid air cells are clear. IMPRESSION: Stable infarct in the medial left occipital lobe. No new area of decreased attenuation evident by noncontrast enhanced CT. Note the  noncontrast enhanced CT from 1 day prior as well as the current examination demonstrates less extensive infarct than is demonstrated on MR obtained earlier in the day. No hemorrhage or mass evident. Electronically Signed   By: Bretta Bang III M.D.   On: 07/01/2017 14:16   Ct Angio Neck W Or Wo Contrast  Addendum Date: 07/02/2017   ADDENDUM REPORT: 07/02/2017 14:20 ADDENDUM: These results were called by telephone at the time of interpretation on 07/02/2017 at 2:20 pm to Dr. Marvel Plan , who verbally acknowledged these results. Electronically Signed   By: Mitzi Hansen M.D.   On: 07/02/2017 14:20   Result Date: 07/02/2017 CLINICAL DATA:  42 y/o F; right facial droop and aphasia. Waxing and waning with original symptoms since arrival. EXAM: CT ANGIOGRAPHY HEAD AND NECK CT PERFUSION BRAIN TECHNIQUE: Multidetector CT imaging of the head and neck was performed using the standard protocol during bolus administration of intravenous contrast. Multiplanar CT image reconstructions and MIPs were obtained to evaluate the vascular anatomy. Carotid stenosis measurements (when applicable) are obtained utilizing NASCET criteria, using the distal internal carotid diameter as the denominator. Multiphase CT imaging of the brain was performed following  IV bolus contrast injection. Subsequent parametric perfusion maps were calculated using RAPID software. CONTRAST:  90mL ISOVUE-370 IOPAMIDOL (ISOVUE-370) INJECTION 76% COMPARISON:  07/01/2017 CT and MRI head. 06/30/2017 CT head and CTA head and neck. FINDINGS: CTA NECK FINDINGS Aortic arch: Bovine variant branching. Imaged portion shows no evidence of aneurysm or dissection. No significant stenosis of the major arch vessel origins. Right carotid system: No evidence of dissection, stenosis (50% or greater) or occlusion. Left carotid system: No evidence of dissection, stenosis (50% or greater) or occlusion. Vertebral arteries: Codominant. No evidence of dissection,  stenosis (50% or greater) or occlusion. Skeleton: Negative. Other neck: Stable right floor of mouth partially visualized fat containing structure measuring approximately 14 mm, probably lipoma. Upper chest: Negative. Review of the MIP images confirms the above findings CTA HEAD FINDINGS Anterior circulation: Segments of stenosis again seen best appreciated in the right proximal M2 superior division at left proximal M2 inferior division. No large vessel occlusion, aneurysm, or vascular malformation identified. Posterior circulation: Stable segments of stenosis in bilateral proximal PCA. No large vessel occlusion, aneurysm, or vascular malformation identified. Venous sinuses: As permitted by contrast timing, patent. Anatomic variants: Patent circle-of-Willis. Delayed phase: No abnormal intracranial enhancement. Few additional small foci of hypoattenuation are present within the left parietal lobe cortex and subcortical white matter in comparison with original CT, with corresponding areas of reduced diffusion on MR, likely representing areas of infarction (series 14 image 18-21). Review of the MIP images confirms the above findings CT Brain Perfusion Findings: CBF (<30%) Volume: 0mL Perfusion (Tmax>6.0s) volume: 8mL Mismatch Volume: 8mL Infarction Location:Lack of perfusion anomaly in left posterior MCA region of infarction is likely due to pseudonormalization. Region of T-max > 6 seconds (8 cc) is present more superiorly in the adjacent left parietal lobe indicating area of at-risk ischemia. A larger region of T-max > 4 seconds (57 cc) extends into the left posterior frontal lobe and may be present in the right watershed compatible with oligemia. IMPRESSION: CT perfusion: 1. No region of CBF (<30%) to indicate new acute infarction. 2. Areas of infarction of left posterior MCA distribution have pseudonormalized perfusion. 3. Region of T-max > 6 seconds (8 cc) in adjacent left parietal lobe indicating penumbra. 4.  Larger region of T-max > 4 seconds (57 cc) extending into left posterior frontal lobe compatible with oligemia. CTA head: 1. Persistent segments of high-grade stenosis and right proximal M2 superior division and left proximal M2 inferior division as well as segments of mild stenosis and bilateral PCA. 2. No new large vessel occlusion or aneurysm of the intracranial circulation. CTA neck: Patent carotid and vertebral arteries. No dissection, aneurysm, or hemodynamically significant stenosis utilizing NASCET criteria. Electronically Signed: By: Mitzi HansenLance  Furusawa-Stratton M.D. On: 07/02/2017 14:06   Ct Cerebral Perfusion W Contrast  Addendum Date: 07/02/2017   ADDENDUM REPORT: 07/02/2017 14:20 ADDENDUM: These results were called by telephone at the time of interpretation on 07/02/2017 at 2:20 pm to Dr. Marvel PlanJINDONG XU , who verbally acknowledged these results. Electronically Signed   By: Mitzi HansenLance  Furusawa-Stratton M.D.   On: 07/02/2017 14:20   Result Date: 07/02/2017 CLINICAL DATA:  42 y/o F; right facial droop and aphasia. Waxing and waning with original symptoms since arrival. EXAM: CT ANGIOGRAPHY HEAD AND NECK CT PERFUSION BRAIN TECHNIQUE: Multidetector CT imaging of the head and neck was performed using the standard protocol during bolus administration of intravenous contrast. Multiplanar CT image reconstructions and MIPs were obtained to evaluate the vascular anatomy. Carotid stenosis measurements (when applicable) are  obtained utilizing NASCET criteria, using the distal internal carotid diameter as the denominator. Multiphase CT imaging of the brain was performed following IV bolus contrast injection. Subsequent parametric perfusion maps were calculated using RAPID software. CONTRAST:  90mL ISOVUE-370 IOPAMIDOL (ISOVUE-370) INJECTION 76% COMPARISON:  07/01/2017 CT and MRI head. 06/30/2017 CT head and CTA head and neck. FINDINGS: CTA NECK FINDINGS Aortic arch: Bovine variant branching. Imaged portion shows no evidence  of aneurysm or dissection. No significant stenosis of the major arch vessel origins. Right carotid system: No evidence of dissection, stenosis (50% or greater) or occlusion. Left carotid system: No evidence of dissection, stenosis (50% or greater) or occlusion. Vertebral arteries: Codominant. No evidence of dissection, stenosis (50% or greater) or occlusion. Skeleton: Negative. Other neck: Stable right floor of mouth partially visualized fat containing structure measuring approximately 14 mm, probably lipoma. Upper chest: Negative. Review of the MIP images confirms the above findings CTA HEAD FINDINGS Anterior circulation: Segments of stenosis again seen best appreciated in the right proximal M2 superior division at left proximal M2 inferior division. No large vessel occlusion, aneurysm, or vascular malformation identified. Posterior circulation: Stable segments of stenosis in bilateral proximal PCA. No large vessel occlusion, aneurysm, or vascular malformation identified. Venous sinuses: As permitted by contrast timing, patent. Anatomic variants: Patent circle-of-Willis. Delayed phase: No abnormal intracranial enhancement. Few additional small foci of hypoattenuation are present within the left parietal lobe cortex and subcortical white matter in comparison with original CT, with corresponding areas of reduced diffusion on MR, likely representing areas of infarction (series 14 image 18-21). Review of the MIP images confirms the above findings CT Brain Perfusion Findings: CBF (<30%) Volume: 0mL Perfusion (Tmax>6.0s) volume: 8mL Mismatch Volume: 8mL Infarction Location:Lack of perfusion anomaly in left posterior MCA region of infarction is likely due to pseudonormalization. Region of T-max > 6 seconds (8 cc) is present more superiorly in the adjacent left parietal lobe indicating area of at-risk ischemia. A larger region of T-max > 4 seconds (57 cc) extends into the left posterior frontal lobe and may be present in  the right watershed compatible with oligemia. IMPRESSION: CT perfusion: 1. No region of CBF (<30%) to indicate new acute infarction. 2. Areas of infarction of left posterior MCA distribution have pseudonormalized perfusion. 3. Region of T-max > 6 seconds (8 cc) in adjacent left parietal lobe indicating penumbra. 4. Larger region of T-max > 4 seconds (57 cc) extending into left posterior frontal lobe compatible with oligemia. CTA head: 1. Persistent segments of high-grade stenosis and right proximal M2 superior division and left proximal M2 inferior division as well as segments of mild stenosis and bilateral PCA. 2. No new large vessel occlusion or aneurysm of the intracranial circulation. CTA neck: Patent carotid and vertebral arteries. No dissection, aneurysm, or hemodynamically significant stenosis utilizing NASCET criteria. Electronically Signed: By: Mitzi Hansen M.D. On: 07/02/2017 14:06        Scheduled Meds: . amLODipine  10 mg Oral Daily  . aspirin  300 mg Rectal Daily   Or  . aspirin  325 mg Oral Daily  . atorvastatin  80 mg Oral q1800  . clopidogrel  75 mg Oral Once  . insulin aspart  0-9 Units Subcutaneous TID WC  . insulin glargine  10 Units Subcutaneous QHS  . loratadine  10 mg Oral Daily  . LORazepam  1 mg Intravenous Once  . losartan  100 mg Oral Daily   Continuous Infusions: . sodium chloride 100 mL/hr at 07/03/17 0256  . albumin  human       LOS: 2 days    Time spent: 40 minutes    Arwilda Georgia, Roselind Messier, MD [facility] Pager [phone]   If 7PM-7AM, please contact night-coverage [email or web address] Password [facility] 07/03/2017, 8:53 AM

## 2017-07-03 NOTE — Progress Notes (Deleted)
CT Head, neck and perfusion completed - no hmg, no new LVO, no salvageable penumbra.   Ct Angio Head W Or Wo Contrast  Addendum Date: 07/02/2017   ADDENDUM REPORT: 07/02/2017 14:20 ADDENDUM: These results were called by telephone at the time of interpretation on 07/02/2017 at 2:20 pm to Dr. Marvel PlanJINDONG XU , who verbally acknowledged these results. Electronically Signed   By: Mitzi HansenLance  Furusawa-Stratton M.D.   On: 07/02/2017 14:20   Result Date: 07/02/2017 CLINICAL DATA:  42 y/o F; right facial droop and aphasia. Waxing and waning with original symptoms since arrival. EXAM: CT ANGIOGRAPHY HEAD AND NECK CT PERFUSION BRAIN TECHNIQUE: Multidetector CT imaging of the head and neck was performed using the standard protocol during bolus administration of intravenous contrast. Multiplanar CT image reconstructions and MIPs were obtained to evaluate the vascular anatomy. Carotid stenosis measurements (when applicable) are obtained utilizing NASCET criteria, using the distal internal carotid diameter as the denominator. Multiphase CT imaging of the brain was performed following IV bolus contrast injection. Subsequent parametric perfusion maps were calculated using RAPID software. CONTRAST:  90mL ISOVUE-370 IOPAMIDOL (ISOVUE-370) INJECTION 76% COMPARISON:  07/01/2017 CT and MRI head. 06/30/2017 CT head and CTA head and neck. FINDINGS: CTA NECK FINDINGS Aortic arch: Bovine variant branching. Imaged portion shows no evidence of aneurysm or dissection. No significant stenosis of the major arch vessel origins. Right carotid system: No evidence of dissection, stenosis (50% or greater) or occlusion. Left carotid system: No evidence of dissection, stenosis (50% or greater) or occlusion. Vertebral arteries: Codominant. No evidence of dissection, stenosis (50% or greater) or occlusion. Skeleton: Negative. Other neck: Stable right floor of mouth partially visualized fat containing structure measuring approximately 14 mm, probably lipoma.  Upper chest: Negative. Review of the MIP images confirms the above findings CTA HEAD FINDINGS Anterior circulation: Segments of stenosis again seen best appreciated in the right proximal M2 superior division at left proximal M2 inferior division. No large vessel occlusion, aneurysm, or vascular malformation identified. Posterior circulation: Stable segments of stenosis in bilateral proximal PCA. No large vessel occlusion, aneurysm, or vascular malformation identified. Venous sinuses: As permitted by contrast timing, patent. Anatomic variants: Patent circle-of-Willis. Delayed phase: No abnormal intracranial enhancement. Few additional small foci of hypoattenuation are present within the left parietal lobe cortex and subcortical white matter in comparison with original CT, with corresponding areas of reduced diffusion on MR, likely representing areas of infarction (series 14 image 18-21). Review of the MIP images confirms the above findings CT Brain Perfusion Findings: CBF (<30%) Volume: 0mL Perfusion (Tmax>6.0s) volume: 8mL Mismatch Volume: 8mL Infarction Location:Lack of perfusion anomaly in left posterior MCA region of infarction is likely due to pseudonormalization. Region of T-max > 6 seconds (8 cc) is present more superiorly in the adjacent left parietal lobe indicating area of at-risk ischemia. A larger region of T-max > 4 seconds (57 cc) extends into the left posterior frontal lobe and may be present in the right watershed compatible with oligemia. IMPRESSION: CT perfusion: 1. No region of CBF (<30%) to indicate new acute infarction. 2. Areas of infarction of left posterior MCA distribution have pseudonormalized perfusion. 3. Region of T-max > 6 seconds (8 cc) in adjacent left parietal lobe indicating penumbra. 4. Larger region of T-max > 4 seconds (57 cc) extending into left posterior frontal lobe compatible with oligemia. CTA head: 1. Persistent segments of high-grade stenosis and right proximal M2 superior  division and left proximal M2 inferior division as well as segments of mild stenosis and bilateral PCA.  2. No new large vessel occlusion or aneurysm of the intracranial circulation. CTA neck: Patent carotid and vertebral arteries. No dissection, aneurysm, or hemodynamically significant stenosis utilizing NASCET criteria. Electronically Signed: By: Mitzi HansenLance  Furusawa-Stratton M.D. On: 07/02/2017 14:06   Ct Angio Neck W Or Wo Contrast  Addendum Date: 07/02/2017   ADDENDUM REPORT: 07/02/2017 14:20 ADDENDUM: These results were called by telephone at the time of interpretation on 07/02/2017 at 2:20 pm to Dr. Marvel PlanJINDONG XU , who verbally acknowledged these results. Electronically Signed   By: Mitzi HansenLance  Furusawa-Stratton M.D.   On: 07/02/2017 14:20   Result Date: 07/02/2017 CLINICAL DATA:  42 y/o F; right facial droop and aphasia. Waxing and waning with original symptoms since arrival. EXAM: CT ANGIOGRAPHY HEAD AND NECK CT PERFUSION BRAIN TECHNIQUE: Multidetector CT imaging of the head and neck was performed using the standard protocol during bolus administration of intravenous contrast. Multiplanar CT image reconstructions and MIPs were obtained to evaluate the vascular anatomy. Carotid stenosis measurements (when applicable) are obtained utilizing NASCET criteria, using the distal internal carotid diameter as the denominator. Multiphase CT imaging of the brain was performed following IV bolus contrast injection. Subsequent parametric perfusion maps were calculated using RAPID software. CONTRAST:  90mL ISOVUE-370 IOPAMIDOL (ISOVUE-370) INJECTION 76% COMPARISON:  07/01/2017 CT and MRI head. 06/30/2017 CT head and CTA head and neck. FINDINGS: CTA NECK FINDINGS Aortic arch: Bovine variant branching. Imaged portion shows no evidence of aneurysm or dissection. No significant stenosis of the major arch vessel origins. Right carotid system: No evidence of dissection, stenosis (50% or greater) or occlusion. Left carotid system: No  evidence of dissection, stenosis (50% or greater) or occlusion. Vertebral arteries: Codominant. No evidence of dissection, stenosis (50% or greater) or occlusion. Skeleton: Negative. Other neck: Stable right floor of mouth partially visualized fat containing structure measuring approximately 14 mm, probably lipoma. Upper chest: Negative. Review of the MIP images confirms the above findings CTA HEAD FINDINGS Anterior circulation: Segments of stenosis again seen best appreciated in the right proximal M2 superior division at left proximal M2 inferior division. No large vessel occlusion, aneurysm, or vascular malformation identified. Posterior circulation: Stable segments of stenosis in bilateral proximal PCA. No large vessel occlusion, aneurysm, or vascular malformation identified. Venous sinuses: As permitted by contrast timing, patent. Anatomic variants: Patent circle-of-Willis. Delayed phase: No abnormal intracranial enhancement. Few additional small foci of hypoattenuation are present within the left parietal lobe cortex and subcortical white matter in comparison with original CT, with corresponding areas of reduced diffusion on MR, likely representing areas of infarction (series 14 image 18-21). Review of the MIP images confirms the above findings CT Brain Perfusion Findings: CBF (<30%) Volume: 0mL Perfusion (Tmax>6.0s) volume: 8mL Mismatch Volume: 8mL Infarction Location:Lack of perfusion anomaly in left posterior MCA region of infarction is likely due to pseudonormalization. Region of T-max > 6 seconds (8 cc) is present more superiorly in the adjacent left parietal lobe indicating area of at-risk ischemia. A larger region of T-max > 4 seconds (57 cc) extends into the left posterior frontal lobe and may be present in the right watershed compatible with oligemia. IMPRESSION: CT perfusion: 1. No region of CBF (<30%) to indicate new acute infarction. 2. Areas of infarction of left posterior MCA distribution have  pseudonormalized perfusion. 3. Region of T-max > 6 seconds (8 cc) in adjacent left parietal lobe indicating penumbra. 4. Larger region of T-max > 4 seconds (57 cc) extending into left posterior frontal lobe compatible with oligemia. CTA head: 1. Persistent segments of  high-grade stenosis and right proximal M2 superior division and left proximal M2 inferior division as well as segments of mild stenosis and bilateral PCA. 2. No new large vessel occlusion or aneurysm of the intracranial circulation. CTA neck: Patent carotid and vertebral arteries. No dissection, aneurysm, or hemodynamically significant stenosis utilizing NASCET criteria. Electronically Signed: By: Mitzi Hansen M.D. On: 07/02/2017 14:06   Ct Cerebral Perfusion W Contrast  Addendum Date: 07/02/2017   ADDENDUM REPORT: 07/02/2017 14:20 ADDENDUM: These results were called by telephone at the time of interpretation on 07/02/2017 at 2:20 pm to Dr. Marvel Plan , who verbally acknowledged these results. Electronically Signed   By: Mitzi Hansen M.D.   On: 07/02/2017 14:20   Result Date: 07/02/2017 CLINICAL DATA:  42 y/o F; right facial droop and aphasia. Waxing and waning with original symptoms since arrival. EXAM: CT ANGIOGRAPHY HEAD AND NECK CT PERFUSION BRAIN TECHNIQUE: Multidetector CT imaging of the head and neck was performed using the standard protocol during bolus administration of intravenous contrast. Multiplanar CT image reconstructions and MIPs were obtained to evaluate the vascular anatomy. Carotid stenosis measurements (when applicable) are obtained utilizing NASCET criteria, using the distal internal carotid diameter as the denominator. Multiphase CT imaging of the brain was performed following IV bolus contrast injection. Subsequent parametric perfusion maps were calculated using RAPID software. CONTRAST:  90mL ISOVUE-370 IOPAMIDOL (ISOVUE-370) INJECTION 76% COMPARISON:  07/01/2017 CT and MRI head. 06/30/2017 CT head  and CTA head and neck. FINDINGS: CTA NECK FINDINGS Aortic arch: Bovine variant branching. Imaged portion shows no evidence of aneurysm or dissection. No significant stenosis of the major arch vessel origins. Right carotid system: No evidence of dissection, stenosis (50% or greater) or occlusion. Left carotid system: No evidence of dissection, stenosis (50% or greater) or occlusion. Vertebral arteries: Codominant. No evidence of dissection, stenosis (50% or greater) or occlusion. Skeleton: Negative. Other neck: Stable right floor of mouth partially visualized fat containing structure measuring approximately 14 mm, probably lipoma. Upper chest: Negative. Review of the MIP images confirms the above findings CTA HEAD FINDINGS Anterior circulation: Segments of stenosis again seen best appreciated in the right proximal M2 superior division at left proximal M2 inferior division. No large vessel occlusion, aneurysm, or vascular malformation identified. Posterior circulation: Stable segments of stenosis in bilateral proximal PCA. No large vessel occlusion, aneurysm, or vascular malformation identified. Venous sinuses: As permitted by contrast timing, patent. Anatomic variants: Patent circle-of-Willis. Delayed phase: No abnormal intracranial enhancement. Few additional small foci of hypoattenuation are present within the left parietal lobe cortex and subcortical white matter in comparison with original CT, with corresponding areas of reduced diffusion on MR, likely representing areas of infarction (series 14 image 18-21). Review of the MIP images confirms the above findings CT Brain Perfusion Findings: CBF (<30%) Volume: 0mL Perfusion (Tmax>6.0s) volume: 8mL Mismatch Volume: 8mL Infarction Location:Lack of perfusion anomaly in left posterior MCA region of infarction is likely due to pseudonormalization. Region of T-max > 6 seconds (8 cc) is present more superiorly in the adjacent left parietal lobe indicating area of at-risk  ischemia. A larger region of T-max > 4 seconds (57 cc) extends into the left posterior frontal lobe and may be present in the right watershed compatible with oligemia. IMPRESSION: CT perfusion: 1. No region of CBF (<30%) to indicate new acute infarction. 2. Areas of infarction of left posterior MCA distribution have pseudonormalized perfusion. 3. Region of T-max > 6 seconds (8 cc) in adjacent left parietal lobe indicating penumbra. 4. Larger region  of T-max > 4 seconds (57 cc) extending into left posterior frontal lobe compatible with oligemia. CTA head: 1. Persistent segments of high-grade stenosis and right proximal M2 superior division and left proximal M2 inferior division as well as segments of mild stenosis and bilateral PCA. 2. No new large vessel occlusion or aneurysm of the intracranial circulation. CTA neck: Patent carotid and vertebral arteries. No dissection, aneurysm, or hemodynamically significant stenosis utilizing NASCET criteria. Electronically Signed: By: Mitzi Hansen M.D. On: 07/02/2017 14:06    Again, sx improved when pt laid flat, worsened when elevated post imaging. Will load with plavix and give albumin to help with volume in order to hopefully prevent symptom recurrence.   Initially passed swallow and tolerated POs. Unable to repeat swallow due to inability to sit up, however, symptoms (other than speech hesitance and occasional expressive aphasia) essentially resolved. Discussed with Dr. Roda Shutters and Victorino Dike RN. Will give plavix po without repeating swallow due to risk of worsening with sitting up. If cough/aspirates, will stop. Will avoid NG  For medication administration purposes.3  Continue to Keep in bed with [facility] flat   Rhoderick Moody [facility] See Amion for Pager information 07/03/2017 10:02 AM

## 2017-07-03 NOTE — Progress Notes (Signed)
Pt family came to the nurse station to inform RN pt c/o numbness and tingling. RN went to assess pt and she denies any numbness or tingling. Pt felt all sensations when tested with sharp and dull objects. Pt said her symptoms comes and goes but was not having any present during assessment. Pt and family advise to notify RN if symptoms return or any new changes. Dionne BucyP. Amo Devun Anna RN

## 2017-07-03 NOTE — Care Management Note (Addendum)
Case Management Note  Patient Details  Name: Debra [name] MRN: [id number] Date of Birth: 12/21/1975  Subjective/Objective:    Pt admitted with CVA. She is from home with her mother. Hx:   . Allergic rhinitis, cause unspecified   . Asthma   . Diabetes mellitus type II   . Endometriosis   . Hypertension   . Knee pain, right    DJD, post traumatic with repeat falls  . Morbid obesity (HCC)    s/p lab band 09/2010  . Ovarian cyst    right s/p resection June 2013   PCP:  Dr Okey Duprerawford              Action/Plan: Awaiting PT/OT evals. CM following for d/c needs, physician orders.   Expected Discharge Date:  (unknown)               Expected Discharge Plan:     In-House Referral:     Discharge planning Services     Post Acute Care Choice:    Choice offered to:     DME Arranged:    DME Agency:     HH Arranged:    HH Agency:     Status of Service:  In process, will continue to follow  If discussed at Long Length of Stay Meetings, dates discussed:    Additional Comments:  Kermit BaloKelli F Dorris Vangorder, RN 07/03/2017, 10:47 AM

## 2017-07-03 NOTE — Evaluation (Addendum)
Physical Therapy Evaluation Patient Details Name: Debra [name] MRN: [id number] DOB: [birth year] Today's Date: 07/03/2017   History of Present Illness  Malaka Gluth is a 42yo black female who comes to [facility] on 3/12  with HA, difficulty vetrbalizing thoughts, and tinglign in her Right hand. PMH includes: HTN, DM2. At baseline, pt has soem chronic Rt knee pain, but otherwise is an independent community ambulating adult.   Clinical Impression  Pt admitted with above diagnosis. Pt currently with functional limitations due to the deficits listed below (see "PT Problem List"). Upon entry, the patient is received semirecumbent in bed, pt's brother in room. The pt is awake and agreeable to participate. No acute distress noted at this time. The pt is alert and oriented x3, pleasant, conversational, and following simple and multi-step commands consistently. Speech is clear, dysfluent, requires effort, intermittently with aphasia, and lacks most regional dialect. Pt denies any acute involvement of BLE, AMB, or balance. All basic mobility is performed with modified independence, pt taking extra time and caution d/t prolonged time in bed and multiple lines/leads. AMB is below normative values for gender and age, but largely attributed to chronic knee issues and large habitus. No noted LOB identified. Pt will benefit from skilled PT intervention to increase independence and safety with basic mobility in preparation for discharge to the venue listed below.       Follow Up Recommendations No PT follow up    Equipment Recommendations  None recommended by PT    Recommendations for Other Services       Precautions / Restrictions Precautions Precautions: None Restrictions Weight Bearing Restrictions: No      Mobility  Bed Mobility Overal bed mobility: Independent                Transfers Overall transfer level: Independent Equipment used: None                 Ambulation/Gait Ambulation/Gait assistance: Supervision Ambulation Distance (Feet): 400 Feet Assistive device: None Gait Pattern/deviations: WFL(Within Functional Limits) Gait velocity: 0.79m/s (baseline per patient)       Stairs            Wheelchair Mobility    Modified Rankin (Stroke Patients Only) Modified Rankin (Stroke Patients Only) Pre-Morbid Rankin Score: No significant disability Modified Rankin: No significant disability     Balance Overall balance assessment: Independent                                           Pertinent Vitals/Pain Pain Assessment: No/denies pain    Home Living Family/patient expects to be discharged to:: Private residence Living Arrangements: Parent Available Help at Discharge: Family Type of Home: House Home Access: Stairs to enter Entrance Stairs-Rails: None Secretary/administrator of Steps: 3 Home Layout: One level Home Equipment: None      Prior Function Level of Independence: Independent               Hand Dominance   Dominant Hand: Right    Extremity/Trunk Assessment   Upper Extremity Assessment Upper Extremity Assessment: Defer to OT evaluation;Overall [facility] for tasks assessed    Lower Extremity Assessment Lower Extremity Assessment: Overall WFL for tasks assessed    Cervical / Trunk Assessment Cervical / Trunk Assessment: Normal  Communication      Cognition Arousal/Alertness: Awake/alert Behavior During Therapy: WFL for tasks assessed/performed Overall Cognitive Status: Within Functional  Limits for tasks assessed                                        General Comments      Exercises     Assessment/Plan    PT Assessment Patient needs continued PT services  PT Problem List Decreased strength;Obesity;Decreased activity tolerance       PT Treatment Interventions Therapeutic activities;Therapeutic exercise;Patient/family education;Cognitive remediation     PT Goals (Current goals can be found in the Care Plan section)  Acute Rehab PT Goals Patient Stated Goal: regain normal speech PT Goal Formulation: With patient Time For Goal Achievement: 07/17/17 Potential to Achieve Goals: Good    Frequency Min 2X/week   Barriers to discharge        Co-evaluation               AM-PAC PT "6 Clicks" Daily Activity  Outcome Measure Difficulty turning over in bed (including adjusting bedclothes, sheets and blankets)?: A Little Difficulty moving from lying on back to sitting on the side of the bed? : A Little Difficulty sitting down on and standing up from a chair with arms (e.g., wheelchair, bedside commode, etc,.)?: A Little Help needed moving to and from a bed to chair (including a wheelchair)?: A Little Help needed walking in hospital room?: A Little Help needed climbing 3-5 steps with a railing? : A Little 6 Click Score: 18    End of Session Equipment Utilized During Treatment: Gait belt Activity Tolerance: Patient tolerated treatment well Patient left: in chair;with family/visitor present;with call bell/phone within reach Nurse Communication: Mobility status PT Visit Diagnosis: Other symptoms and signs involving the nervous system (R29.898)    Time: [id number] PT Time Calculation (min) (ACUTE ONLY): 37 min   Charges:   PT Evaluation $PT Eval Low Complexity: 1 Low PT Treatments $Therapeutic Activity: 8-22 mins   PT G Codes:        12:31 PM, 07/03/17 Rosamaria LintsAllan C Chasten Blaze, PT, DPT Physical Therapist -  [phone] (Pager)  [phone] (Office)     Lianni Kanaan C 07/03/2017, 12:25 PM

## 2017-07-03 NOTE — Progress Notes (Signed)
SLP Cancellation Note  Patient Details Name: Debra [name] MRN: [id number] DOB: 03/24/1976   Cancelled treatment:       Reason Eval/Treat Not Completed: Patient at procedure or test/unavailable- Pt in MRI   Blenda MountsCouture, Francena Zender [name] 07/03/2017, 2:47 PM

## 2017-07-03 NOTE — Evaluation (Addendum)
Occupational Therapy Evaluation Patient Details Name: Debra [name] MRN: [id number] DOB: 05/19/1975 Today's Date: 07/03/2017    History of Present Illness Debra [name] is a 42yo black female [facility]  with HA, difficulty vetrbalizing thoughts, and tinglign in her Right hand. PMH includes: HTN, DM2. At baseline, pt has soem chronic Rt knee pain, but otherwise is an independent community ambulating adult.    Clinical Impression   Pt ia at min guard A level with ADLs in standing, sit - stand, min guard A with shower transfer. Pt  Demonstrates difficulty with word finding, but is able to correctly and coherently speak with extra time. Pt would benefit from acute OT services to address impairments to maximize level of function and safety     Follow Up Recommendations  No OT follow up;Supervision - Intermittent    Equipment Recommendations  None recommended by OT    Recommendations for Other Services       Precautions / Restrictions Precautions Precautions: None Restrictions Weight Bearing Restrictions: No      Mobility Bed Mobility Overal bed mobility: Independent             General bed mobility comments: slow, guarded movement  Transfers Overall transfer level: Independent Equipment used: None             General transfer comment: slow, guarded movement    Balance Overall balance assessment: Independent                                         ADL either performed or assessed with clinical judgement   ADL Overall ADL's : Needs assistance/impaired Eating/Feeding: Independent;Sitting   Grooming: Wash/dry hands;Wash/dry face;Set up;Supervision/safety;Standing;With caregiver independent assisting   Upper Body Bathing: Min guard;Standing   Lower Body Bathing: Min guard;Sit to/from stand;With caregiver independent assisting   Upper Body Dressing : Min guard;Standing;With caregiver independent assisting   Lower Body Dressing:  Min guard;Sit to/from stand;With caregiver independent assisting   Toilet Transfer: Min guard;Cueing for sequencing;With caregiver independent assisting   Toileting- Clothing Manipulation and Hygiene: Supervision/safety;Sit to/from stand   Tub/ Shower Transfer: Min guard;With caregiver independent assisting;Ambulation   Functional mobility during ADLs: Min guard General ADL Comments: pt movements slow and guarded, increased time to complete activities     Vision Baseline Vision/History: No visual deficits Patient Visual Report: No change from baseline       Perception     Praxis      Pertinent Vitals/Pain Pain Assessment: No/denies pain     Hand Dominance Right   Extremity/Trunk Assessment Upper Extremity Assessment Upper Extremity Assessment: Overall WFL for tasks assessed(slow, guarded movements)   Lower Extremity Assessment Lower Extremity Assessment: Defer to PT evaluation   Cervical / Trunk Assessment Cervical / Trunk Assessment: Normal   Communication Communication Communication: Expressive difficulties;Other (comment)(difficulty with word finding)   Cognition Arousal/Alertness: Awake/alert Behavior During Therapy: WFL for tasks assessed/performed Overall Cognitive Status: Within Functional Limits for tasks assessed                                     General Comments       Exercises     Shoulder Instructions      Home Living Family/patient expects to be discharged to:: Private residence Living Arrangements: Parent Available Help at Discharge:  Family Type of Home: House Home Access: Stairs to enter [employer] of Steps: 3 Entrance Stairs-Rails: None Home Layout: One level     Bathroom Shower/Tub: Tub/shower unit;Walk-in Human resources officer: Standard     Home Equipment: None      Lives With: Family    Prior Functioning/Environment Level of Independence: Independent                 OT Problem List:  Obesity;Decreased activity tolerance      OT Treatment/Interventions:      OT Goals(Current goals can be found in the care plan section) Acute Rehab OT Goals Patient Stated Goal: get better OT Goal Formulation: With patient/family Time For Goal Achievement: 07/17/17 Potential to Achieve Goals: Good ADL Goals Pt Will Perform Grooming: standing;with caregiver independent in assisting;with supervision;with set-up Pt Will Perform Lower Body Bathing: with caregiver independent in assisting;sit to/from stand;with supervision;with set-up Pt Will Perform Lower Body Dressing: with supervision;with set-up;with caregiver independent in assisting Pt Will Transfer to Toilet: with supervision;ambulating;with modified independence Pt Will Perform Toileting - Clothing Manipulation and hygiene: with modified independence;sit to/from stand;with caregiver independent in assisting Pt Will Perform Tub/Shower Transfer: with supervision;with modified independence;ambulating;with caregiver independent in assisting Additional ADL Goal #1: pt will verbalize safety techniques during ADL mobility tasks  OT Frequency:  2x/wk   Barriers to D/C:    no barriers       Co-evaluation              AM-PAC PT "6 Clicks" Daily Activity     Outcome Measure Help from another person eating meals?: None Help from another person taking care of personal grooming?: None Help from another person toileting, which includes using toliet, bedpan, or urinal?: A Little Help from another person bathing (including washing, rinsing, drying)?: A Little Help from another person to put on and taking off regular upper body clothing?: A Little Help from another person to put on and taking off regular lower body clothing?: A Little 6 Click Score: 20   End of Session    Activity Tolerance: Patient tolerated treatment well Patient left: in bed;with call bell/phone within reach;with family/visitor present  OT Visit Diagnosis: Other  abnormalities of gait and mobility (R26.89)                Time: 1914-7829 OT Time Calculation (min): 28 min Charges:  OT General Charges $OT Visit: 1 Visit OT Evaluation $OT Eval Low Complexity: 1 Low OT Treatments $Therapeutic Activity: 8-22 mins G-Codes: OT G-codes **NOT FOR INPATIENT CLASS** Functional Assessment Tool Used: AM-PAC 6 Clicks Daily Activity     Galen Manila 07/03/2017, 2:37 PM

## 2017-07-04 ENCOUNTER — Encounter (HOSPITAL_COMMUNITY): Payer: Self-pay | Admitting: Student

## 2017-07-04 ENCOUNTER — Inpatient Hospital Stay (HOSPITAL_COMMUNITY): Payer: BC Managed Care – PPO

## 2017-07-04 DIAGNOSIS — I6603 Occlusion and stenosis of bilateral middle cerebral arteries: Secondary | ICD-10-CM

## 2017-07-04 DIAGNOSIS — E1165 Type 2 diabetes mellitus with hyperglycemia: Secondary | ICD-10-CM

## 2017-07-04 DIAGNOSIS — R55 Syncope and collapse: Secondary | ICD-10-CM

## 2017-07-04 DIAGNOSIS — I639 Cerebral infarction, unspecified: Secondary | ICD-10-CM

## 2017-07-04 HISTORY — PX: IR ANGIO INTRA EXTRACRAN SEL COM CAROTID INNOMINATE BILAT MOD SED: IMG5360

## 2017-07-04 HISTORY — PX: IR ANGIO VERTEBRAL SEL VERTEBRAL BILAT MOD SED: IMG5369

## 2017-07-04 LAB — GLUCOSE, CAPILLARY
GLUCOSE-CAPILLARY: 137 mg/dL — AB (ref 65–99)
GLUCOSE-CAPILLARY: 148 mg/dL — AB (ref 65–99)
Glucose-Capillary: 174 mg/dL — ABNORMAL HIGH (ref 65–99)

## 2017-07-04 LAB — FACTOR 5 LEIDEN

## 2017-07-04 LAB — PROTHROMBIN GENE MUTATION

## 2017-07-04 MED ORDER — IOPAMIDOL (ISOVUE-300) INJECTION 61%
INTRAVENOUS | Status: AC
  Filled 2017-07-04: qty 50

## 2017-07-04 MED ORDER — FENTANYL CITRATE (PF) 100 MCG/2ML IJ SOLN
INTRAMUSCULAR | Status: AC
  Filled 2017-07-04: qty 2

## 2017-07-04 MED ORDER — CLOPIDOGREL BISULFATE 75 MG PO TABS
75.0000 mg | ORAL_TABLET | Freq: Every day | ORAL | Status: DC
  Administered 2017-07-04 – 2017-07-06 (×3): 75 mg via ORAL
  Filled 2017-07-04 (×4): qty 1

## 2017-07-04 MED ORDER — LIDOCAINE HCL 1 % IJ SOLN
INTRAMUSCULAR | Status: AC
  Filled 2017-07-04: qty 20

## 2017-07-04 MED ORDER — FENTANYL CITRATE (PF) 100 MCG/2ML IJ SOLN
INTRAMUSCULAR | Status: AC | PRN
  Administered 2017-07-04: 25 ug via INTRAVENOUS
  Administered 2017-07-07: 100 ug via INTRAVENOUS

## 2017-07-04 MED ORDER — HYDRALAZINE HCL 20 MG/ML IJ SOLN
INTRAMUSCULAR | Status: AC | PRN
  Administered 2017-07-04 (×2): 5 mg via INTRAVENOUS

## 2017-07-04 MED ORDER — LIDOCAINE HCL 1 % IJ SOLN
INTRAMUSCULAR | Status: AC | PRN
  Administered 2017-07-04: 20 mL

## 2017-07-04 MED ORDER — PHENYLEPHRINE HCL 10 MG/ML IJ SOLN
0.0000 ug/min | INTRAMUSCULAR | Status: DC
  Administered 2017-07-04: 130 ug/min via INTRAVENOUS
  Administered 2017-07-04 (×2): 190 ug/min via INTRAVENOUS
  Administered 2017-07-04: 200 ug/min via INTRAVENOUS
  Administered 2017-07-04 (×2): 190 ug/min via INTRAVENOUS
  Administered 2017-07-04 (×2): 200 ug/min via INTRAVENOUS
  Administered 2017-07-05: 300 ug/min via INTRAVENOUS
  Administered 2017-07-05: 290 ug/min via INTRAVENOUS
  Administered 2017-07-05: 275 ug/min via INTRAVENOUS
  Administered 2017-07-05: 290 ug/min via INTRAVENOUS
  Administered 2017-07-05: 300 ug/min via INTRAVENOUS
  Administered 2017-07-05: 275 ug/min via INTRAVENOUS
  Administered 2017-07-05 (×2): 280 ug/min via INTRAVENOUS
  Administered 2017-07-05: 220 ug/min via INTRAVENOUS
  Administered 2017-07-05: 320 ug/min via INTRAVENOUS
  Administered 2017-07-05: 290 ug/min via INTRAVENOUS
  Administered 2017-07-05: 240 ug/min via INTRAVENOUS
  Administered 2017-07-05: 300 ug/min via INTRAVENOUS
  Administered 2017-07-05: 290 ug/min via INTRAVENOUS
  Administered 2017-07-05: 260 ug/min via INTRAVENOUS
  Filled 2017-07-04: qty 10
  Filled 2017-07-04 (×10): qty 1
  Filled 2017-07-04: qty 10

## 2017-07-04 MED ORDER — IOPAMIDOL (ISOVUE-300) INJECTION 61%
INTRAVENOUS | Status: AC
  Administered 2017-07-04: 78 mL
  Filled 2017-07-04: qty 150

## 2017-07-04 MED ORDER — SODIUM CHLORIDE 0.9 % IV SOLN
INTRAVENOUS | Status: AC
  Administered 2017-07-05 – 2017-07-06 (×4): via INTRAVENOUS

## 2017-07-04 MED ORDER — HEPARIN SODIUM (PORCINE) 1000 UNIT/ML IJ SOLN
INTRAMUSCULAR | Status: AC | PRN
  Administered 2017-07-04: 1000 [IU] via INTRAVENOUS
  Administered 2017-07-07: 3000 [IU] via INTRAVENOUS

## 2017-07-04 MED ORDER — POTASSIUM CHLORIDE CRYS ER 20 MEQ PO TBCR
40.0000 meq | EXTENDED_RELEASE_TABLET | Freq: Once | ORAL | Status: AC
  Administered 2017-07-04: 40 meq via ORAL
  Filled 2017-07-04: qty 2

## 2017-07-04 MED ORDER — CLOPIDOGREL BISULFATE 75 MG PO TABS: 300.0000 mg | ORAL_TABLET | Freq: Once | ORAL | Status: DC

## 2017-07-04 MED ORDER — POTASSIUM CHLORIDE CRYS ER 10 MEQ PO TBCR
EXTENDED_RELEASE_TABLET | ORAL | Status: AC
  Administered 2017-07-04: 40 meq via ORAL
  Filled 2017-07-04: qty 4

## 2017-07-04 MED ORDER — MIDAZOLAM HCL 2 MG/2ML IJ SOLN
INTRAMUSCULAR | Status: DC | PRN
  Administered 2017-07-04: 1 mg via INTRAVENOUS

## 2017-07-04 MED ORDER — HYDRALAZINE HCL 20 MG/ML IJ SOLN
INTRAMUSCULAR | Status: AC
  Filled 2017-07-04: qty 1

## 2017-07-04 MED ORDER — MIDAZOLAM HCL 2 MG/2ML IJ SOLN
INTRAMUSCULAR | Status: AC
  Filled 2017-07-04: qty 2

## 2017-07-04 MED ORDER — HEPARIN SODIUM (PORCINE) 1000 UNIT/ML IJ SOLN
INTRAMUSCULAR | Status: AC
  Filled 2017-07-04: qty 1

## 2017-07-04 NOTE — Sedation Documentation (Signed)
Patient is resting comfortably. 

## 2017-07-04 NOTE — Progress Notes (Signed)
Patient ID: Debra [name], female   DOB: 01/20/1976, 42 y.o.   MRN: [id number] INR. Post diagnostic arteriogram,whilst pressure was being held  In the RT  groin for hemostasis  patient C/O feeling warm and nausea and was diaphoretic Systolic BP was noted to be in the 100s range with a HR of 60s. Noted to be aphasic, mostly expressive in nature. Severe RT arm and leg weakness was noted as well . Patient was given a bolus of a liter of normal saline and put in trendelenburg position. Code stroke was also called.Was assessed by the stroke teamas well. Over the next 10 to 15 min gradual return to baseline of her motor function was noted,with imprement of her speech.Motoe function back to baseline at time of transfer to NICU  for neuro obs and vasopressor support as needed to keep BP  In the 180s range.. D/W patient and her brother. [email or web address]

## 2017-07-04 NOTE — Progress Notes (Signed)
Code Stroke called for acute onset of verbal unresponsiveness in the setting of a drop in BP during 4-vessel angiogram in VIR. BP normalized to the 150s and at the time of the arrival of the stroke team, the patient's symptoms had resolved, suggestive of a watershed TIA due to the transient drop in her BP.   Initially seen by Neurohospitalist service on 3/12 for TIA manifesting as  slurred speech, expressive aphasia, and slight numbness in her right 1st and 2nd fingers. CTA head/neck at the time of initial assessment showed changes in the anterior and posterior circulation thought to represent cerebral vasculitis vs RCVS/PRES without large vessel occlusion, aneurysm, or AVM. Subsequent MRI Brain WO contrast showed a cluster of acute infarctions in the posterior left temporal lobe and temporoparietal junction region, which radiology felt were most consistent with embolic infarctions. On my review of the images, they appear more consistent with a watershed distribution.   Per Dr. Corliss Skainseveshwar, angiogram showed the following: 75% + stenosis of prox dominant LT MCA inf division and approximately 80% stenosis of RT MCA distal M1 seg    Currently on ASA and Plavix.   Assessment/Recommendations: 42 year old female with left MCA/ACA or MCA/PCA territory watershed TIA versus global hypoperfusion of left MCA territory in the setting of transient hypotension during 4 vessel angiogram.  1. Predisposing factor to watershed TIA: Left MCA stenosis.  2. Per Dr. Corliss Skainseveshwar, the stenoses appear most likely to be secondary to atherosclerotic disease rather than RCVS or vasculitis.  3. Will transfer to the ICU under the Neurology service for BP management with SBP goal aof 170-190 and DBP goal of 90-105. 4. Continue ASA and Plavix. 5. Stenting procedure with Dr. Corliss Skainseveshwar is tentatively scheduled for Monday.  6. Consulting ICU team for management of neosynephrine gtt to maintain the blood pressure parameters outlined above.    Electronically signed: Dr. Caryl PinaEric Arra Connaughton

## 2017-07-04 NOTE — Consult Note (Addendum)
PULMONARY / CRITICAL CARE MEDICINE   Name: Debra [name] MRN: [id number] DOB: September 27, 1975    ADMISSION DATE:  06/30/2017 CONSULTATION DATE:  3/11  REFERRING MD:  Horatio Pel   CHIEF COMPLAINT:  Hemodynamic management in acute CVA  HISTORY OF PRESENT ILLNESS:   This is a 42 year old female with a significant history of hypertension, diabetes type 2, And asthma.  Presented to the emergency room on 3/12 with a working diagnosis of PRES, manifesting as slurred speech, expressive aphasia and numbness on her right hand.  Subsequent imaging via MRI on 3/14 showed cluster of acute infarcts in the posterior left temporal lobe and temporoparietal junction felt to be most consistent with embolic infarct.  She had been admitted by the stroke team, and following imaging CT angiogram on 3/15 obtained demonstrated 75% plus stenosis of proximal dominant left MCA and 80% stenosis of the right MCA.  While holding pressure for sheath removal after arteriogram pt has acute vaso-vagal response w/ short change in MS that improved spontaneously. Based on these findings and concern for further risk of perfusion related neurovascular injury he was transferred to the intensive care.  He is planned for neurovascular procedure tentatively on 3/18 with hopes to stent the occluded arteries in the meantime pulmonary critical care has been asked to evaluate and manage blood pressure with goal systolic blood pressure 299 to 160   PAST MEDICAL HISTORY :  She  has a past medical history of Allergic rhinitis, cause unspecified, Asthma, Diabetes mellitus type II (08/2009 dx), Endometriosis, Hypertension, Knee pain, right, Morbid obesity ([place]), and Ovarian cyst.  PAST SURGICAL HISTORY: She  has a past surgical history that includes Laparoscopic gastric banding (10/02/2010); Salpingoophorectomy (10/08/11); and Abdominal hysterectomy (05/14/12).  Allergies  Allergen Reactions  . Influenza Vaccines   . Metformin And Related Diarrhea  .  Morphine Hives  . Augmentin [Amoxicillin-Pot Clavulanate] Rash    To bilat post hands only    No current facility-administered medications on file prior to encounter.    Current Outpatient Medications on File Prior to Encounter  Medication Sig  . albuterol (PROAIR HFA) 108 (90 BASE) MCG/ACT inhaler Inhale 2 puffs into the lungs every 4 (four) hours as needed for wheezing or shortness of breath.  [name] albuterol (PROVENTIL) (2.5 MG/3ML) 0.083% nebulizer solution Take 2.5 mg by nebulization every 6 (six) hours as needed for wheezing or shortness of breath.  . blood glucose meter kit and supplies Dispense based on patient and insurance preference. Use up to four times daily as directed. (FOR ICD-9 250.00, 250.01).  [name] diphenhydrAMINE (BENADRYL) 25 mg capsule Take 1 capsule (25 mg total) by mouth every 6 (six) hours as needed. (Patient taking differently: Take 25 mg by mouth every 6 (six) hours as needed for itching or allergies. )  . fexofenadine (ALLEGRA) 180 MG tablet Take 180 mg by mouth daily as needed. For seasonal allergies  . glipiZIDE (GLUCOTROL) 5 MG tablet TAKE 1 TABLET(5 MG) BY MOUTH TWICE DAILY BEFORE A MEAL  . glucose blood (ONE TOUCH ULTRA TEST) test strip Use as directed  . losartan (COZAAR) 100 MG tablet TAKE 1 TABLET BY MOUTH DAILY  . mometasone (NASONEX) 50 MCG/ACT nasal spray Place 2 sprays into the nose daily as needed. For nasal spray  . norethindrone (AYGESTIN) 5 MG tablet Take 5 mg by mouth daily.   . TRULICITY 1.5 BZ/1.6RC SOPN INJECT 1.5 MG INTO THE SKIN ONCE A WEEK  . benzonatate (TESSALON) 200 MG capsule Take 1 capsule (200 mg  total) by mouth 3 (three) times daily as needed. Swallow whole-do not bite pill (Patient not taking: Reported on 06/30/2017)  . dextromethorphan-guaiFENesin (MUCINEX DM) 30-600 MG 12hr tablet Take 1 tablet by mouth 2 (two) times daily as needed for cough. (Patient not taking: Reported on 06/30/2017)  . doxycycline (VIBRA-TABS) 100 MG tablet Take 1 tablet  (100 mg total) by mouth 2 (two) times daily. (Patient not taking: Reported on 06/30/2017)  . empagliflozin (JARDIANCE) 25 MG TABS tablet Take 25 mg by mouth daily. (Patient not taking: Reported on 06/30/2017)  . fluticasone (FLONASE) 50 MCG/ACT nasal spray Place 2 sprays into both nostrils daily. (Patient not taking: Reported on 06/30/2017)  . Fluticasone-Salmeterol (ADVAIR DISKUS) 250-50 MCG/DOSE AEPB Inhale 1 puff into the lungs 2 (two) times daily. (Patient not taking: Reported on 06/30/2017)  . HYDROcodone-homatropine (HYCODAN) 5-1.5 MG/5ML syrup Take 5 mLs by mouth every 6 (six) hours as needed for cough. (Patient not taking: Reported on 06/30/2017)  . olopatadine (PATANOL) 0.1 % ophthalmic solution Place 1 drop into both eyes 2 (two) times daily. (Patient not taking: Reported on 06/30/2017)  . sodium chloride (OCEAN) 0.65 % SOLN nasal spray Place 1 spray into both nostrils as needed for congestion. (Patient not taking: Reported on 06/30/2017)    FAMILY HISTORY:  Her indicated that her mother is alive. She indicated that her father is alive. She indicated that her brother is alive.   SOCIAL HISTORY: She  reports that  has never smoked. she has never used smokeless tobacco. She reports that she does not drink alcohol or use drugs.  REVIEW OF SYSTEMS:   Gen: No current headache, fever, chills HEENT: No nasal congestion, no headache, no sore throat. Pulmonary: No shortness of breath no cough no pain Cardiac no chest pain, palpitations.  Extremity, no pain  GI: No pain, Nausea vomiting Neuro: Currently no focal deficits SUBJECTIVE:  Awake alert no focal deficits currently feels comfortable  VITAL SIGNS: BP 125/73 (BP Location: Right Arm)   Pulse 74   Temp 98.4 F (36.9 C) (Oral)   Resp 20   Ht '5\' 8"'  (1.727 m)   Wt (!) 327 lb (148.3 kg)   LMP 07/28/2011   SpO2 100%   BMI 49.72 kg/m    HEMODYNAMICS:    VENTILATOR SETTINGS:    INTAKE / OUTPUT: I/O last 3 completed shifts: In:  3011.7 [P.O.:240; I.V.:2021.7; IV Piggyback:750] Out: 2700 [Urine:2700]  PHYSICAL EXAMINATION: General: Obese 42 year old female currently resting in the supine position Neuro: Awake oriented speech clear no clear focal deficits currently HEENT: Normocephalic atraumatic no jugular venous distention Cardiovascular: The rate and rhythm without murmur rub or gallop Lungs: Clear to auscultation Abdomen: Soft nontender Musculoskeletal: Equal strength and bulk Skin: Warm and dry strong pulses  LABS:  BMET Recent Labs  Lab 06/30/17 2325 06/30/17 2331 07/03/17 1002  NA 140 142 137  K 3.5 3.5 3.4*  CL 107 106 106  CO2 25  --  20*  BUN 6 4* <5*  CREATININE 0.91 0.90 0.77  GLUCOSE 206* 208* 184*    Electrolytes Recent Labs  Lab 06/30/17 2325 07/03/17 1002  CALCIUM 8.7* 8.2*  MG 1.8 1.7    CBC Recent Labs  Lab 06/30/17 2325 06/30/17 2331 07/03/17 1002  WBC 9.3  --  9.6  HGB 14.3 15.6* 13.3  HCT 44.4 46.0 41.6  PLT 325  --  282    Coag's Recent Labs  Lab 06/30/17 2325  APTT 27  INR 1.06  Sepsis Markers No results for input(s): LATICACIDVEN, PROCALCITON, O2SATVEN in the last 168 hours.  ABG No results for input(s): PHART, PCO2ART, PO2ART in the last 168 hours.  Liver Enzymes Recent Labs  Lab 06/30/17 2325  AST 16  ALT 18  ALKPHOS 60  BILITOT 0.4  ALBUMIN 3.6    Cardiac Enzymes No results for input(s): TROPONINI, PROBNP in the last 168 hours.  Glucose Recent Labs  Lab 07/03/17 0648 07/03/17 1216 07/03/17 1729 07/03/17 2255 07/04/17 0628 07/04/17 1145  GLUCAP 144* 230* 211* 205* 174* 137*    Imaging No results found.    SIGNIFICANT EVENTS  3/11  Admit for TIA  3/15  To Neuro IR for 4 vessel arteriogram   STUDIES 3/11 CTA Head/Neck >> multiple segments of arterial stenosis in the anterior and posterior circulation which may represent cerebral vasculitis or possibly reversible cerebral vasoconstriction syndrome (PRES).  No large  vessel occlusion, widely patent carotid and vertebral arteries in the neck  CTA Head/Neck 3/13 >> persistent segments of high grade stenosis and right proximal M2 superior division and left proximal M2  CT Perfusion 3/13 >> no region of CBF to indicate new infarction.  Areas of infarction of the left posterior MCA distribution have pseudonormalized perfusion Region of T-max > 6 seconds (8 cc) in adjacent left parietal lobe indicating penumbra.  Larger region of T-max > 4 seconds (57 cc) extending into left posterior frontal lobe compatible with oligemia. MRI 3/14 >> patchy acute infarct in the left posterior MCA & watershed distributions, moderat to large total extent 3/15  Arteriogram > 75%+ stenosis of proximal dominant left MCA, 80% stenosis of R MCA distal M1 segment   CULTURES   ANTIBIOTICS    DISCUSSION: Acute CVA, awaiting neurovascular intervention  ASSESSMENT / PLAN:  Acute left MCA/PCA stroke with symptoms exacerbated by transient hypotension and vasovagal response -Per IR angiogram showed 75% or more stenosis of proximal dominant left MCA and an 80% stenosis of the right MCA -Followed by stroke team and interventional radiology Plan Continuing aspirin Plavix Ck cbc now  Hold antihypertensives Systolic blood pressure goal 150-160 per my discussion with stroke team; titrating Neo-Synephrine Plan for stenting procedure in IR on 3/18 Continue serial neuro checks Continue supportive care  Transient vasovagal response Plan Continue telemetry monitoring   Fluid and electrolyte imbalance: Hypokalemia  plan Replace and recheck  Borderline hyperglycemia Plan Trend glucose  07/04/2017, 4:09 PM  Erick Colace ACNP-BC [place] Pager # [phone] OR # [phone] if no answer

## 2017-07-04 NOTE — Consult Note (Signed)
Chief Complaint: Patient was seen in consultation today for intracranial stenosis.  Referring Physician(s): Lavera Guise  Supervising Physician: Luanne Bras  Patient Status: Debra [name] - In-pt  History of Present Illness: Debra [name] is a 42 y.o. female   Hx HTN, DM, obesity. Presented to ED 06/30/2017 for altered mental status, headache, difficulty with speech, left arm weakness, and severely elevated blood pressure. Neurology evaluation was done revealing NIH stroke scale of 3.  CT angio head and neck 07/02/2017: 1. Persistent segments of high-grade stenosis and right proximal M2 superior division and left proximal M2 inferior division as well as segments of mild stenosis and bilateral PCA. 2. No new large vessel occlusion or aneurysm of the intracranial Circulation. 3. Patent carotid and vertebral arteries. No dissection, aneurysm, or hemodynamically significant stenosis utilizing NASCET criteria.  Debra brain 07/03/2017: Patchy acute infarct in the left posterior MCA and watershed distributions, moderate to large in total extent.  IR was consulted by Dr. Erlinda Hong for image-guided cerebral angiogram. Patient has difficulty with word finding and expressing. Motor function of upper extremities is symmetric. Patient is NPO.   Past Medical History:  Diagnosis Date  . Allergic rhinitis, cause unspecified   . Asthma   . Diabetes mellitus type II 08/2009 dx  . Endometriosis   . Hypertension   . Knee pain, right    DJD, post traumatic with repeat falls  . Morbid obesity ([place])    s/p lab band 09/2010  . Ovarian cyst    right s/p resection June 2013    Past Surgical History:  Procedure Laterality Date  . ABDOMINAL HYSTERECTOMY  05/14/12   [place]  . LAPAROSCOPIC GASTRIC BANDING  10/02/2010   start weight 349#  . SALPINGOOPHORECTOMY  10/08/11   right    Allergies: Influenza vaccines; Metformin and related; Morphine; and Augmentin [amoxicillin-pot  clavulanate]  Medications: Prior to Admission medications   Medication Sig Start Date End Date Taking? Authorizing Provider  albuterol (PROAIR HFA) 108 (90 BASE) MCG/ACT inhaler Inhale 2 puffs into the lungs every 4 (four) hours as needed for wheezing or shortness of breath. 04/18/15  Yes Rowe Clack, MD  albuterol (PROVENTIL) (2.5 MG/3ML) 0.083% nebulizer solution Take 2.5 mg by nebulization every 6 (six) hours as needed for wheezing or shortness of breath.   Yes [provider]  blood glucose meter kit and supplies Dispense based on patient and insurance preference. Use up to four times daily as directed. (FOR ICD-9 250.00, 250.01). 06/03/14  Yes Rowe Clack, MD  diphenhydrAMINE (BENADRYL) 25 mg capsule Take 1 capsule (25 mg total) by mouth every 6 (six) hours as needed. Patient taking differently: Take 25 mg by mouth every 6 (six) hours as needed for itching or allergies.  07/13/16  Yes Shary Decamp, PA-C  fexofenadine (ALLEGRA) 180 MG tablet Take 180 mg by mouth daily as needed. For seasonal allergies   Yes [provider]  glipiZIDE (GLUCOTROL) 5 MG tablet TAKE 1 TABLET(5 MG) BY MOUTH TWICE DAILY BEFORE A MEAL 05/15/17  Yes Hoyt Koch, MD  glucose blood (ONE TOUCH ULTRA TEST) test strip Use as directed 10/28/13  Yes Rowe Clack, MD  losartan (COZAAR) 100 MG tablet TAKE 1 TABLET BY MOUTH DAILY 06/02/17  Yes Hoyt Koch, MD  mometasone (NASONEX) 50 MCG/ACT nasal spray Place 2 sprays into the nose daily as needed. For nasal spray   Yes [provider]  norethindrone (AYGESTIN) 5 MG tablet Take 5 mg by mouth  daily.  07/06/13  Yes [provider]  TRULICITY 1.5 DU/3.7DH SOPN INJECT 1.5 MG INTO THE SKIN ONCE A WEEK 12/24/16  Yes Hoyt Koch, MD  benzonatate (TESSALON) 200 MG capsule Take 1 capsule (200 mg total) by mouth 3 (three) times daily as needed. Swallow whole-do not bite pill Patient not taking: Reported on  06/30/2017 04/19/17   Tower, Wynelle Fanny, MD  dextromethorphan-guaiFENesin [facility] DM) 30-600 MG 12hr tablet Take 1 tablet by mouth 2 (two) times daily as needed for cough. Patient not taking: Reported on 06/30/2017 02/18/17   Nche, Charlene Brooke, NP  doxycycline (VIBRA-TABS) 100 MG tablet Take 1 tablet (100 mg total) by mouth 2 (two) times daily. Patient not taking: Reported on 06/30/2017 04/24/17   Biagio Borg, MD  empagliflozin (JARDIANCE) 25 MG TABS tablet Take 25 mg by mouth daily. Patient not taking: Reported on 06/30/2017 04/24/17   Biagio Borg, MD  fluticasone [facility]) 50 MCG/ACT nasal spray Place 2 sprays into both nostrils daily. Patient not taking: Reported on 06/30/2017 09/23/16   Delano Metz, FNP  Fluticasone-Salmeterol (ADVAIR DISKUS) 250-50 MCG/DOSE AEPB Inhale 1 puff into the lungs 2 (two) times daily. Patient not taking: Reported on 06/30/2017 07/22/11   Rowe Clack, MD  HYDROcodone-homatropine [facility]) 5-1.5 MG/5ML syrup Take 5 mLs by mouth every 6 (six) hours as needed for cough. Patient not taking: Reported on 06/30/2017 02/18/17   Nche, Charlene Brooke, NP  olopatadine (PATANOL) 0.1 % ophthalmic solution Place 1 drop into both eyes 2 (two) times daily. Patient not taking: Reported on 06/30/2017 08/08/12   Darlyne Russian, MD  sodium chloride (OCEAN) 0.65 % SOLN nasal spray Place 1 spray into both nostrils as needed for congestion. Patient not taking: Reported on 06/30/2017 02/18/17   Nche, Charlene Brooke, NP     Family History  Problem Relation Age of Onset  . Heart disease Mother   . Hypertension Mother   . Diabetes Mother   . Stroke Father 43  . Diabetes Father     Social History   Socioeconomic History  . Marital status: Single    Spouse name: n/a  . Number of children: 0  . Years of education: 12+  . Highest education level: None  Social Needs  . Financial resource strain: None  . Food insecurity - worry: None  . Food insecurity - inability: None  .  Transportation needs - medical: None  . Transportation needs - non-medical: None  Occupational History  . Occupation: school bus driver  Tobacco Use  . Smoking status: Never Smoker  . Smokeless tobacco: Never Used  Substance and Sexual Activity  . Alcohol use: No    Alcohol/week: 0.0 oz  . Drug use: No  . Sexual activity: None  Other Topics Concern  . None  Social History Narrative   Single, lives with mom.    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   Completed some college.     Review of Systems: A 12 point ROS discussed and pertinent positives are indicated in the HPI above.  All other systems are negative.  Review of Systems  Constitutional: Negative for activity change and fever.  Eyes: Negative for visual disturbance.  Respiratory: Negative for shortness of breath and wheezing.   Cardiovascular: Negative for chest pain and palpitations.  Neurological: Positive for speech difficulty. Negative for dizziness, facial asymmetry and headaches.  Psychiatric/Behavioral: Negative for behavioral problems.  Vital Signs: BP (!) 157/73 (BP Location: Right Arm) Comment: map93  Pulse 79   Temp 98.4 F (36.9 C) (Oral)   Resp 18   Ht '5\' 8"'  (1.727 m)   Wt (!) 327 lb (148.3 kg)   LMP 07/28/2011   SpO2 100%   BMI 49.72 kg/m   Physical Exam  Constitutional: She is oriented to person, place, and time. She appears well-developed and well-nourished. No distress.  Cardiovascular: Normal rate, regular rhythm, normal heart sounds and intact distal pulses.  No murmur heard. Pulmonary/Chest: Effort normal and breath sounds normal. She has no wheezes.  Neurological: She is alert and oriented to person, place, and time.  Difficult word finding and expression. Bilateral upper extremity muscle strength 4/5 and symmetric. Symmetric motor function of upper extremities.  Skin: Skin is warm and dry.  Psychiatric:  She has a normal mood and affect. Her behavior is normal. Judgment and thought content normal.  Nursing note and vitals reviewed.    MD Evaluation Airway: WNL Heart: WNL Abdomen: WNL Chest/ Lungs: WNL ASA  Classification: 3 Mallampati/Airway Score: Two   Imaging: Ct Angio Head W Or [name] Contrast  Addendum Date: 07/02/2017   ADDENDUM REPORT: 07/02/2017 14:20 ADDENDUM: These results were called by telephone at the time of interpretation on 07/02/2017 at 2:20 pm to Dr. Rosalin Hawking , who verbally acknowledged these results. Electronically Signed   By: Kristine Garbe M.D.   On: 07/02/2017 14:20   Result Date: 07/02/2017 CLINICAL DATA:  42 y/o F; right facial droop and aphasia. Waxing and waning with original symptoms since arrival. EXAM: CT ANGIOGRAPHY HEAD AND NECK CT PERFUSION BRAIN TECHNIQUE: Multidetector CT imaging of the head and neck was performed using the standard protocol during bolus administration of intravenous contrast. Multiplanar CT image reconstructions and MIPs were obtained to evaluate the vascular anatomy. Carotid stenosis measurements (when applicable) are obtained utilizing NASCET criteria, using the distal internal carotid diameter as the denominator. Multiphase CT imaging of the brain was performed following IV bolus contrast injection. Subsequent parametric perfusion maps were calculated using RAPID software. CONTRAST:  50m ISOVUE-370 IOPAMIDOL (ISOVUE-370) INJECTION 76% COMPARISON:  07/01/2017 CT and MRI head. 06/30/2017 CT head and CTA head and neck. FINDINGS: CTA NECK FINDINGS Aortic arch: Bovine variant branching. Imaged portion shows no evidence of aneurysm or dissection. No significant stenosis of the major arch vessel origins. Right carotid system: No evidence of dissection, stenosis (50% or greater) or occlusion. Left carotid system: No evidence of dissection, stenosis (50% or greater) or occlusion. Vertebral arteries: Codominant. No evidence of dissection,  stenosis (50% or greater) or occlusion. Skeleton: Negative. Other neck: Stable right floor of mouth partially visualized fat containing structure measuring approximately 14 mm, probably lipoma. Upper chest: Negative. Review of the MIP images confirms the above findings CTA HEAD FINDINGS Anterior circulation: Segments of stenosis again seen best appreciated in the right proximal M2 superior division at left proximal M2 inferior division. No large vessel occlusion, aneurysm, or vascular malformation identified. Posterior circulation: Stable segments of stenosis in bilateral proximal PCA. No large vessel occlusion, aneurysm, or vascular malformation identified. Venous sinuses: As permitted by contrast timing, patent. Anatomic variants: Patent circle-of-Willis. Delayed phase: No abnormal intracranial enhancement. Few additional small foci of hypoattenuation are present within the left parietal lobe cortex and subcortical white matter in comparison with original CT, with corresponding areas of reduced diffusion on Debra, likely representing areas of infarction (series 14 image 18-21). Review of the MIP images confirms the above findings CT  Brain Perfusion Findings: CBF (<30%) Volume: 44m Perfusion (Tmax>6.0s) volume: 880mMismatch Volume: 89m289mnfarction Location:Lack of perfusion anomaly in left posterior MCA region of infarction is likely due to pseudonormalization. Region of T-max > 6 seconds (8 cc) is present more superiorly in the adjacent left parietal lobe indicating area of at-risk ischemia. A larger region of T-max > 4 seconds (57 cc) extends into the left posterior frontal lobe and may be present in the right watershed compatible with oligemia. IMPRESSION: CT perfusion: 1. No region of CBF (<30%) to indicate new acute infarction. 2. Areas of infarction of left posterior MCA distribution have pseudonormalized perfusion. 3. Region of T-max > 6 seconds (8 cc) in adjacent left parietal lobe indicating penumbra. 4.  Larger region of T-max > 4 seconds (57 cc) extending into left posterior frontal lobe compatible with oligemia. CTA head: 1. Persistent segments of high-grade stenosis and right proximal M2 superior division and left proximal M2 inferior division as well as segments of mild stenosis and bilateral PCA. 2. No new large vessel occlusion or aneurysm of the intracranial circulation. CTA neck: Patent carotid and vertebral arteries. No dissection, aneurysm, or hemodynamically significant stenosis utilizing NASCET criteria. Electronically Signed: By: LanKristine GarbeD. On: 07/02/2017 14:06   Ct Angio Head W Or [name] Contrast  Result Date: 07/01/2017 CLINICAL DATA:  [age]o F; altered mental status, evaluation for stroke. EXAM: CT ANGIOGRAPHY HEAD AND NECK TECHNIQUE: Multidetector CT imaging of the head and neck was performed using the standard protocol during bolus administration of intravenous contrast. Multiplanar CT image reconstructions and MIPs were obtained to evaluate the vascular anatomy. Carotid stenosis measurements (when applicable) are obtained utilizing NASCET criteria, using the distal internal carotid diameter as the denominator. CONTRAST:  100m189mOVUE-370 IOPAMIDOL (ISOVUE-370) INJECTION 76% COMPARISON:  06/30/2017 CT head. FINDINGS: CTA NECK FINDINGS Aortic arch: Standard branching. Imaged portion shows no evidence of aneurysm or dissection. No significant stenosis of the major arch vessel origins. Right carotid system: No evidence of dissection, stenosis (50% or greater) or occlusion. Left carotid system: No evidence of dissection, stenosis (50% or greater) or occlusion. Vertebral arteries: Codominant. No evidence of dissection, stenosis (50% or greater) or occlusion. Skeleton: Negative. Other neck: Right floor of mouth fat containing structure measuring 14 mm, probably a small lipoma, partially obscured by streak artifact from dental hardware (series 5, image 57). Upper chest: Negative. Review  of the MIP images confirms the above findings CTA HEAD FINDINGS Anterior circulation: No large vessel occlusion, aneurysm, or vascular malformation. Posterior circulation: No large vessel occlusion, aneurysm, or vascular malformation. Multiple segments of stenosis are present in the anterior and posterior circulation best appreciated in the right superior M2, left inferior M2, right P1, left P2 segments. Venous sinuses: As permitted by contrast timing, patent. Anatomic variants: Diminutive right and small left posterior communicating arteries. No anterior communicating artery identified. Delayed phase: No abnormal intracranial enhancement. Review of the MIP images confirms the above findings IMPRESSION: 1. Multiple segments of arterial stenosis in the anterior and posterior circulation which may represent cerebral vasculitis or possibly reversible cerebral vasoconstriction syndrome (RCVS)/PRES given posterior distribution cortical abnormality. 2. No large vessel occlusion, aneurysm, or vascular malformation. 3. Widely patent carotid and vertebral arteries in the neck. These results were called by telephone at the time of interpretation on 06/30/2017 at 11:58 pm to Dr. DAVIDelora Fuelho verbally acknowledged these results. Electronically Signed   By: LancKristine Garbe.   On: 07/01/2017 00:05   Ct Head [name]  Contrast  Result Date: 07/01/2017 CLINICAL DATA:  Transient slurred speech EXAM: CT HEAD WITHOUT CONTRAST TECHNIQUE: Contiguous axial images were obtained from the base of the skull through the vertex without intravenous contrast. COMPARISON:  Head CT June 30, 2017 and brain MRI July 01, 2017 FINDINGS: Brain: There is a stable area of decreased attenuation in the medial left occipital lobe consistent with recent infarct in this area, confirmed on diffusion Debra imaging. By CT, no new focus of decreased attenuation evident. No evident mass, hemorrhage, extra-axial fluid collection, or midline shift.  Vascular: No hyperdense vessel evident. No vascular calcification evident. Skull: Bony calvarium appears intact. Sinuses/Orbits: Visualized paranasal sinuses are clear. Visualized orbits appear symmetric bilaterally. Other: Mastoid air cells are clear. IMPRESSION: Stable infarct in the medial left occipital lobe. No new area of decreased attenuation evident by noncontrast enhanced CT. Note the noncontrast enhanced CT from 1 day prior as well as the current examination demonstrates less extensive infarct than is demonstrated on Debra obtained earlier in the day. No hemorrhage or mass evident. Electronically Signed   By: Lowella Grip III M.D.   On: 07/01/2017 14:16   Ct Angio Neck W Or [name] Contrast  Addendum Date: 07/02/2017   ADDENDUM REPORT: 07/02/2017 14:20 ADDENDUM: These results were called by telephone at the time of interpretation on 07/02/2017 at 2:20 pm to Dr. Rosalin Hawking , who verbally acknowledged these results. Electronically Signed   By: Kristine Garbe M.D.   On: 07/02/2017 14:20   Result Date: 07/02/2017 CLINICAL DATA:  42 y/o F; right facial droop and aphasia. Waxing and waning with original symptoms since arrival. EXAM: CT ANGIOGRAPHY HEAD AND NECK CT PERFUSION BRAIN TECHNIQUE: Multidetector CT imaging of the head and neck was performed using the standard protocol during bolus administration of intravenous contrast. Multiplanar CT image reconstructions and MIPs were obtained to evaluate the vascular anatomy. Carotid stenosis measurements (when applicable) are obtained utilizing NASCET criteria, using the distal internal carotid diameter as the denominator. Multiphase CT imaging of the brain was performed following IV bolus contrast injection. Subsequent parametric perfusion maps were calculated using RAPID software. CONTRAST:  83m ISOVUE-370 IOPAMIDOL (ISOVUE-370) INJECTION 76% COMPARISON:  07/01/2017 CT and MRI head. 06/30/2017 CT head and CTA head and neck. FINDINGS: CTA NECK FINDINGS  Aortic arch: Bovine variant branching. Imaged portion shows no evidence of aneurysm or dissection. No significant stenosis of the major arch vessel origins. Right carotid system: No evidence of dissection, stenosis (50% or greater) or occlusion. Left carotid system: No evidence of dissection, stenosis (50% or greater) or occlusion. Vertebral arteries: Codominant. No evidence of dissection, stenosis (50% or greater) or occlusion. Skeleton: Negative. Other neck: Stable right floor of mouth partially visualized fat containing structure measuring approximately 14 mm, probably lipoma. Upper chest: Negative. Review of the MIP images confirms the above findings CTA HEAD FINDINGS Anterior circulation: Segments of stenosis again seen best appreciated in the right proximal M2 superior division at left proximal M2 inferior division. No large vessel occlusion, aneurysm, or vascular malformation identified. Posterior circulation: Stable segments of stenosis in bilateral proximal PCA. No large vessel occlusion, aneurysm, or vascular malformation identified. Venous sinuses: As permitted by contrast timing, patent. Anatomic variants: Patent circle-of-Willis. Delayed phase: No abnormal intracranial enhancement. Few additional small foci of hypoattenuation are present within the left parietal lobe cortex and subcortical white matter in comparison with original CT, with corresponding areas of reduced diffusion on Debra, likely representing areas of infarction (series 14 image 18-21). Review of the MIP  images confirms the above findings CT Brain Perfusion Findings: CBF (<30%) Volume: 41m Perfusion (Tmax>6.0s) volume: 833mMismatch Volume: 59m65mnfarction Location:Lack of perfusion anomaly in left posterior MCA region of infarction is likely due to pseudonormalization. Region of T-max > 6 seconds (8 cc) is present more superiorly in the adjacent left parietal lobe indicating area of at-risk ischemia. A larger region of T-max > 4 seconds (57  cc) extends into the left posterior frontal lobe and may be present in the right watershed compatible with oligemia. IMPRESSION: CT perfusion: 1. No region of CBF (<30%) to indicate new acute infarction. 2. Areas of infarction of left posterior MCA distribution have pseudonormalized perfusion. 3. Region of T-max > 6 seconds (8 cc) in adjacent left parietal lobe indicating penumbra. 4. Larger region of T-max > 4 seconds (57 cc) extending into left posterior frontal lobe compatible with oligemia. CTA head: 1. Persistent segments of high-grade stenosis and right proximal M2 superior division and left proximal M2 inferior division as well as segments of mild stenosis and bilateral PCA. 2. No new large vessel occlusion or aneurysm of the intracranial circulation. CTA neck: Patent carotid and vertebral arteries. No dissection, aneurysm, or hemodynamically significant stenosis utilizing NASCET criteria. Electronically Signed: By: LanKristine GarbeD. On: 07/02/2017 14:06   Ct Angio Neck W Or [name] Contrast  Result Date: 07/01/2017 CLINICAL DATA:  [age]o F; altered mental status, evaluation for stroke. EXAM: CT ANGIOGRAPHY HEAD AND NECK TECHNIQUE: Multidetector CT imaging of the head and neck was performed using the standard protocol during bolus administration of intravenous contrast. Multiplanar CT image reconstructions and MIPs were obtained to evaluate the vascular anatomy. Carotid stenosis measurements (when applicable) are obtained utilizing NASCET criteria, using the distal internal carotid diameter as the denominator. CONTRAST:  100m39mOVUE-370 IOPAMIDOL (ISOVUE-370) INJECTION 76% COMPARISON:  06/30/2017 CT head. FINDINGS: CTA NECK FINDINGS Aortic arch: Standard branching. Imaged portion shows no evidence of aneurysm or dissection. No significant stenosis of the major arch vessel origins. Right carotid system: No evidence of dissection, stenosis (50% or greater) or occlusion. Left carotid system: No  evidence of dissection, stenosis (50% or greater) or occlusion. Vertebral arteries: Codominant. No evidence of dissection, stenosis (50% or greater) or occlusion. Skeleton: Negative. Other neck: Right floor of mouth fat containing structure measuring 14 mm, probably a small lipoma, partially obscured by streak artifact from dental hardware (series 5, image 57). Upper chest: Negative. Review of the MIP images confirms the above findings CTA HEAD FINDINGS Anterior circulation: No large vessel occlusion, aneurysm, or vascular malformation. Posterior circulation: No large vessel occlusion, aneurysm, or vascular malformation. Multiple segments of stenosis are present in the anterior and posterior circulation best appreciated in the right superior M2, left inferior M2, right P1, left P2 segments. Venous sinuses: As permitted by contrast timing, patent. Anatomic variants: Diminutive right and small left posterior communicating arteries. No anterior communicating artery identified. Delayed phase: No abnormal intracranial enhancement. Review of the MIP images confirms the above findings IMPRESSION: 1. Multiple segments of arterial stenosis in the anterior and posterior circulation which may represent cerebral vasculitis or possibly reversible cerebral vasoconstriction syndrome (RCVS)/PRES given posterior distribution cortical abnormality. 2. No large vessel occlusion, aneurysm, or vascular malformation. 3. Widely patent carotid and vertebral arteries in the neck. These results were called by telephone at the time of interpretation on 06/30/2017 at 11:58 pm to Dr. DAVIDelora Fuelho verbally acknowledged these results. Electronically Signed   By: LancKristine Garbe.   On: 07/01/2017  00:05   Debra [name] Contrast  Result Date: 07/03/2017 CLINICAL DATA:  Headache.  Difficulty verbalizing thoughts. EXAM: MRI HEAD WITHOUT CONTRAST TECHNIQUE: Multiplanar, multiecho pulse sequences of the brain and surrounding structures  were obtained without intravenous contrast. COMPARISON:  Head CT and CTA from yesterday. FINDINGS: Diffusion only imaging was requested. There is patchy acute infarct in the left posterior frontal, parietal, superficial temporal and far lateral occipital cortex with white matter extension to the atrium of the left lateral ventricle. Patchy left corona radiata infarct. No midline shift. No gross susceptibility artifact. IMPRESSION: Patchy acute infarct in the left posterior MCA and watershed distributions, moderate to large in total extent. Electronically Signed   By: Monte Fantasia M.D.   On: 07/03/2017 15:15   Debra [name] Contrast  Result Date: 07/01/2017 CLINICAL DATA:  Hypertension and diabetes. Acute presentation with headache and speech disturbance last night. EXAM: MRI HEAD WITHOUT CONTRAST TECHNIQUE: Multiplanar, multiecho pulse sequences of the brain and surrounding structures were obtained without intravenous contrast. COMPARISON:  CT studies 06/30/2017 FINDINGS: Brain: Diffusion imaging shows a cluster of numerous small acute infarctions in the left posterior temporal and temporoparietal junction region consistent with embolic disease in the left MCA territory. No other region of acute insult. No large confluent infarction. The brainstem and cerebellum are normal. No mass effect or hemorrhage. The remainder the brain is negative. No mass, hydrocephalus or extra-axial collection. Vascular: Major vessels at the base of the brain show flow. Skull and upper cervical spine: Negative Sinuses/Orbits: Clear/normal Other: None IMPRESSION: Cluster of acute infarctions in the posterior left temporal lobe and temporoparietal junction region consistent with embolic infarctions in the left MCA territory. No other abnormal brain parenchymal finding. Whereas this could represent an extremely localized manifestation of posterior reversible encephalopathy, I think that is less likely. Electronically Signed   By: Nelson Chimes M.D.   On: 07/01/2017 07:36   Ct Cerebral Perfusion W Contrast  Addendum Date: 07/02/2017   ADDENDUM REPORT: 07/02/2017 14:20 ADDENDUM: These results were called by telephone at the time of interpretation on 07/02/2017 at 2:20 pm to Dr. Rosalin Hawking , who verbally acknowledged these results. Electronically Signed   By: Kristine Garbe M.D.   On: 07/02/2017 14:20   Result Date: 07/02/2017 CLINICAL DATA:  42 y/o F; right facial droop and aphasia. Waxing and waning with original symptoms since arrival. EXAM: CT ANGIOGRAPHY HEAD AND NECK CT PERFUSION BRAIN TECHNIQUE: Multidetector CT imaging of the head and neck was performed using the standard protocol during bolus administration of intravenous contrast. Multiplanar CT image reconstructions and MIPs were obtained to evaluate the vascular anatomy. Carotid stenosis measurements (when applicable) are obtained utilizing NASCET criteria, using the distal internal carotid diameter as the denominator. Multiphase CT imaging of the brain was performed following IV bolus contrast injection. Subsequent parametric perfusion maps were calculated using RAPID software. CONTRAST:  72m ISOVUE-370 IOPAMIDOL (ISOVUE-370) INJECTION 76% COMPARISON:  07/01/2017 CT and MRI head. 06/30/2017 CT head and CTA head and neck. FINDINGS: CTA NECK FINDINGS Aortic arch: Bovine variant branching. Imaged portion shows no evidence of aneurysm or dissection. No significant stenosis of the major arch vessel origins. Right carotid system: No evidence of dissection, stenosis (50% or greater) or occlusion. Left carotid system: No evidence of dissection, stenosis (50% or greater) or occlusion. Vertebral arteries: Codominant. No evidence of dissection, stenosis (50% or greater) or occlusion. Skeleton: Negative. Other neck: Stable right floor of mouth partially visualized fat containing structure measuring approximately 14 mm,  probably lipoma. Upper chest: Negative. Review of the MIP images  confirms the above findings CTA HEAD FINDINGS Anterior circulation: Segments of stenosis again seen best appreciated in the right proximal M2 superior division at left proximal M2 inferior division. No large vessel occlusion, aneurysm, or vascular malformation identified. Posterior circulation: Stable segments of stenosis in bilateral proximal PCA. No large vessel occlusion, aneurysm, or vascular malformation identified. Venous sinuses: As permitted by contrast timing, patent. Anatomic variants: Patent circle-of-Willis. Delayed phase: No abnormal intracranial enhancement. Few additional small foci of hypoattenuation are present within the left parietal lobe cortex and subcortical white matter in comparison with original CT, with corresponding areas of reduced diffusion on Debra, likely representing areas of infarction (series 14 image 18-21). Review of the MIP images confirms the above findings CT Brain Perfusion Findings: CBF (<30%) Volume: 19m Perfusion (Tmax>6.0s) volume: 871mMismatch Volume: 54m40mnfarction Location:Lack of perfusion anomaly in left posterior MCA region of infarction is likely due to pseudonormalization. Region of T-max > 6 seconds (8 cc) is present more superiorly in the adjacent left parietal lobe indicating area of at-risk ischemia. A larger region of T-max > 4 seconds (57 cc) extends into the left posterior frontal lobe and may be present in the right watershed compatible with oligemia. IMPRESSION: CT perfusion: 1. No region of CBF (<30%) to indicate new acute infarction. 2. Areas of infarction of left posterior MCA distribution have pseudonormalized perfusion. 3. Region of T-max > 6 seconds (8 cc) in adjacent left parietal lobe indicating penumbra. 4. Larger region of T-max > 4 seconds (57 cc) extending into left posterior frontal lobe compatible with oligemia. CTA head: 1. Persistent segments of high-grade stenosis and right proximal M2 superior division and left proximal M2 inferior division  as well as segments of mild stenosis and bilateral PCA. 2. No new large vessel occlusion or aneurysm of the intracranial circulation. CTA neck: Patent carotid and vertebral arteries. No dissection, aneurysm, or hemodynamically significant stenosis utilizing NASCET criteria. Electronically Signed: By: LanKristine GarbeD. On: 07/02/2017 14:06   [employer] Uni Left Inc Axilla  Result Date: 06/17/2017 CLINICAL DATA:  Screening recall for possible left breast mass. EXAM: DIGITAL DIAGNOSTIC UNILATERAL LEFT MAMMOGRAM WITH CAD AND TOMO COMPARISON:  Previous exam(s). ACR Breast Density Category a: The breast tissue is almost entirely fatty. FINDINGS: Spot compression tomograms were performed over the inner left breast demonstrating a low density mass with margin irregularity measuring approximately 1 cm. Mammographic images were processed with CAD. Targeted ultrasound of the inner left breast was performed demonstrating a mixed echogenicity mass at 10 o'clock 12 cm from the nipple which appears cystic centrally and peripherally hyperechoic suggestive of an area of fat necrosis. This measures 1 x 0.8 x 0.9 cm in corresponds well with the mass seen in the left breast at mammography. In addition, there is an adjacent tiny probable oil cyst measuring less than 2 mm. IMPRESSION: Probably benign left breast mass demonstrating imaging features suggestive of fat necrosis. RECOMMENDATION: Diagnostic mammography of the left breast with possible left breast ultrasound in 6 months. I have discussed the findings and recommendations with the patient. Results were also provided in writing at the conclusion of the visit. If applicable, a reminder letter will be sent to the patient regarding the next appointment. BI-RADS CATEGORY  3: Probably benign. Electronically Signed   By: JenEverlean AlstromD.   On: 06/17/2017 14:31   Mm Diag Breast Tomo Uni Left  Result Date: 06/17/2017 CLINICAL DATA:  Screening  recall for  possible left breast mass. EXAM: DIGITAL DIAGNOSTIC UNILATERAL LEFT MAMMOGRAM WITH CAD AND TOMO COMPARISON:  Previous exam(s). ACR Breast Density Category a: The breast tissue is almost entirely fatty. FINDINGS: Spot compression tomograms were performed over the inner left breast demonstrating a low density mass with margin irregularity measuring approximately 1 cm. Mammographic images were processed with CAD. Targeted ultrasound of the inner left breast was performed demonstrating a mixed echogenicity mass at 10 o'clock 12 cm from the nipple which appears cystic centrally and peripherally hyperechoic suggestive of an area of fat necrosis. This measures 1 x 0.8 x 0.9 cm in corresponds well with the mass seen in the left breast at mammography. In addition, there is an adjacent tiny probable oil cyst measuring less than 2 mm. IMPRESSION: Probably benign left breast mass demonstrating imaging features suggestive of fat necrosis. RECOMMENDATION: Diagnostic mammography of the left breast with possible left breast ultrasound in 6 months. I have discussed the findings and recommendations with the patient. Results were also provided in writing at the conclusion of the visit. If applicable, a reminder letter will be sent to the patient regarding the next appointment. BI-RADS CATEGORY  3: Probably benign. Electronically Signed   By: Everlean Alstrom M.D.   On: 06/17/2017 14:31   Mm Screening Breast Tomo Bilateral  Result Date: 06/12/2017 CLINICAL DATA:  Screening. EXAM: DIGITAL SCREENING BILATERAL MAMMOGRAM WITH TOMO AND CAD COMPARISON:  Previous exam(s). ACR Breast Density Category a: The breast tissue is almost entirely fatty. FINDINGS: In the left breast, a possible mass warrants further evaluation. In the right breast, no findings suspicious for malignancy. Images were processed with CAD. IMPRESSION: Further evaluation is suggested for possible mass in the left breast. RECOMMENDATION: Diagnostic mammogram and possibly  ultrasound of the left breast. (Code:[id number]) The patient will be contacted regarding the findings, and additional imaging will be scheduled. BI-RADS CATEGORY  0: Incomplete. Need additional imaging evaluation and/or prior mammograms for comparison. Electronically Signed   By: Lajean Manes M.D.   On: 06/12/2017 10:35   Ct Head Code Stroke [name] Contrast  Result Date: 06/30/2017 CLINICAL DATA:  Code stroke. 42 y/o F; altered mental status, code stroke. EXAM: CT HEAD WITHOUT CONTRAST TECHNIQUE: Contiguous axial images were obtained from the base of the skull through the vertex without intravenous contrast. COMPARISON:  None. FINDINGS: Brain: Small 14 mm wedge-shaped focus of hypoattenuation within the left parieto-occipital junction (series 2, image 18) which may represent an area of acute or subacute infarction. No hemorrhage or mass effect. No additional area of stroke, hemorrhage, or mass effect identified. No hydrocephalus, effacement of basilar cisterns, or extra-axial collection. Vascular: No hyperdense vessel or unexpected calcification. Skull: Normal. Negative for fracture or focal lesion. Sinuses/Orbits: No acute finding. Other: None. ASPECTS [facility] Stroke Program Early CT Score) - Ganglionic level infarction (caudate, lentiform nuclei, internal capsule, insula, M1-M3 cortex): 7 - Supraganglionic infarction (M4-M6 cortex): 3 Total score (0-10 with 10 being normal): 10 IMPRESSION: 1. Small 14 mm cortical focus of hypoattenuation within left parieto-occipital junction which may represent acute or subacute infarction. No hemorrhage or mass effect. 2. ASPECTS is 10 These results were called by telephone at the time of interpretation on 06/30/2017 at 11:26 pm to Dr. Delora Fuel , who verbally acknowledged these results. Electronically Signed   By: Kristine Garbe M.D.   On: 06/30/2017 23:29    Labs:  CBC: Recent Labs    06/30/17 2325 06/30/17 2331 07/03/17 1002  WBC 9.3  --  9.6  HGB 14.3  15.6* 13.3  HCT 44.4 46.0 41.6  PLT 325  --  282    COAGS: Recent Labs    06/30/17 2325  INR 1.06  APTT 27    BMP: Recent Labs    06/30/17 2325 06/30/17 2331 07/03/17 1002  NA 140 142 137  K 3.5 3.5 3.4*  CL 107 106 106  CO2 25  --  20*  GLUCOSE 206* 208* 184*  BUN 6 4* <5*  CALCIUM 8.7*  --  8.2*  CREATININE 0.91 0.90 0.77  GFRNONAA >60  --  >60  GFRAA >60  --  >60    LIVER FUNCTION TESTS: Recent Labs    06/30/17 2325  BILITOT 0.4  AST 16  ALT 18  ALKPHOS 60  PROT 7.1  ALBUMIN 3.6    TUMOR MARKERS: No results for input(s): AFPTM, CEA, CA199, CHROMGRNA in the last 8760 hours.  Assessment and Plan:  Intracranial stenosis.  Case and images discussed with Dr. Estanislado Pandy. Plan for image guided cerebral angiogram today. Will determine if further intervention is needed based off angiogram results. Patient is NPO.  Risks and benefits of cerebral angiogram were discussed with the patient including, but not limited to bleeding, infection, vascular injury or contrast induced renal failure. This interventional procedure involves the use of X-rays and because of the nature of the planned procedure, it is possible that we will have prolonged use of X-ray fluoroscopy.  Potential radiation risks to you include (but are not limited to) the following: - A slightly elevated risk for cancer  several years later in life. This risk is typically less than 0.5% percent. This risk is low in comparison to the normal incidence of human cancer, which is 33% for women and 50% for men according to the [employer]. - Radiation induced injury can include skin redness, resembling a rash, tissue breakdown / ulcers and hair loss (which can be temporary or permanent).   The likelihood of either of these occurring depends on the difficulty of the procedure and whether you are sensitive to radiation due to previous procedures, disease, or genetic conditions.   IF your procedure  requires a prolonged use of radiation, you will be notified and given written instructions for further action.  It is your responsibility to monitor the irradiated area for the 2 weeks following the procedure and to notify your physician if you are concerned that you have suffered a radiation induced injury.    All of the patient's questions were answered, patient is agreeable to proceed. Consent signed and in chart.  Thank you for this interesting consult.  I greatly enjoyed meeting Debra [name] and look forward to participating in their care.  A copy of this report was sent to the requesting provider on this date.  Electronically Signed: Earley Abide, PA-C 07/04/2017, 9:59 AM   I spent a total of 30 minutes  in face to face in clinical consultation, greater than 50% of which was counseling/coordinating care for intercranial stenosis.

## 2017-07-04 NOTE — Progress Notes (Signed)
STROKE TEAM PROGRESS NOTE   SUBJECTIVE (INTERVAL HISTORY) Brother at bedside. Improving speech fluency overnight, but not resolved. She is prepared for the angio today. Wants to get it over so she can eat.   OBJECTIVE Vitals:   07/03/17 1603 07/03/17 2006 07/04/17 0018 07/04/17 0411  BP: (!) 182/99 (!) 175/88 (!) 163/97 (!) 157/73  Pulse: 77 77 84 79  Resp: _0 Temp: 97.9 F (36.6 C) 98.6 F (37 C) 98.2 F (36.8 C) 98.4 F (36.9 C)  TempSrc: Oral Oral Oral Oral  SpO2: 100% 98% 100% 100%  Weight:      Height:        CBC:  Recent Labs  Lab 06/30/17 2325 06/30/17 2331 07/03/17 1002  WBC 9.3  --  9.6  NEUTROABS 6.2  --   --   HGB 14.3 15.6* 13.3  HCT 44.4 46.0 41.6  MCV 84.4  --  84.2  PLT 325  --  342    Basic Metabolic Panel:  Recent Labs  Lab 06/30/17 2325 06/30/17 2331 07/03/17 1002  NA 140 142 137  K 3.5 3.5 3.4*  CL 107 106 106  CO2 25  --  20*  GLUCOSE 206* 208* 184*  BUN 6 4* <5*  CREATININE 0.91 0.90 0.77  CALCIUM 8.7*  --  8.2*  MG 1.8  --  1.7    Lipid Panel:     Component Value Date/Time   CHOL 168 07/01/2017 0800   TRIG 60 07/01/2017 0800   HDL 27 (L) 07/01/2017 0800   CHOLHDL 6.2 07/01/2017 0800   VLDL 12 07/01/2017 0800   LDLCALC 129 (H) 07/01/2017 0800   HgbA1c:  Lab Results  Component Value Date   HGBA1C 8.5 (H) 07/01/2017    IMAGING Mr Brain Wo Contrast  Result Date: 07/03/2017 CLINICAL DATA:  Headache.  Difficulty verbalizing thoughts. EXAM: MRI HEAD WITHOUT CONTRAST TECHNIQUE: Multiplanar, multiecho pulse sequences of the brain and surrounding structures were obtained without intravenous contrast. COMPARISON:  Head CT and CTA from yesterday. FINDINGS: Diffusion only imaging was requested. There is patchy acute infarct in the left posterior frontal, parietal, superficial temporal and far lateral occipital cortex with white matter extension to the atrium of the left lateral ventricle. Patchy left corona radiata infarct. No  midline shift. No gross susceptibility artifact. IMPRESSION: Patchy acute infarct in the left posterior MCA and watershed distributions, moderate to large in total extent. Electronically Signed   By: Monte Fantasia M.D.   On: 07/03/2017 15:15     GENERAL EXAM: Patient is in no distress. Sitting up in bed.  CARDIOVASCULAR: Regular rate and rhythm, no murmurs, no carotid bruits. Lungs clear.  NEUROLOGIC: MENTAL STATUS: awake, alert, speech hesitant with improving expressive aphasia with improved naming and less paraphasic errors. Comprehension accurate, can follow 1, 2 & 3 step commands. CRANIAL NERVE: able to count fingers in all fields, movement. extraocular muscles intact, no nystagmus, no facial weakness. facial sensation symmetric, no tongue deviation.  MOTOR: normal bulk and tone, full strength on the Left. Proximal strength on R normal. Decreased FMM on the R with left arm orbiting right.  SENSORY: normal and symmetric to light touch, temperature. Sensation intact; denies finger numbness COORDINATION: finger-nose-finger, fine finger movements, heel-shin normal GAIT/STATION: deferred.   ASSESSMENT/PLAN [name] is a 42 y.o. female with history of HTN, type 2 DM, obesity, endometriosis/ovarian cyst s/p hysterectomy/SBO and seasonal allergies presenting initially to [facility] with slurred speech and expressive aphasia. She  did not receive IV t-PA due to NIH 0.   Stroke:   Left posterior temporal lobe infarcts L MCA territory in setting of uncontrolled risk factors and severe bilateral M2 intracranial stenosis, infarcts felt to be secondary to large vessel disease source though need to rule out vasculities  Resultant  Mild expressive and receptive aphasia, mild R hand clumsiness  Symptoms have remained stable over night   CT head Small 14 mm cortical focus of hypoattenuation within left parieto-occipital junction possible acute infarct, ASPECTS 10  CTA H&N on admission Multiple  anterior and posterior stenosis, no ELVO  Repeat CT head w/ worsening stable left medial occipital lobe infarct, no new abnormalities.   MRI head cluster of acute infarct posterior left temporal lobe and temporoparietal junction region in the L MCA territory  Repeat CTA head, neck unchanged bilateram high grade M2 stenosis.   CTP with small perfusion deficit, not amenable to intervention  Repeat MRI enlarged L posterior MCA and watershed infarct   2D Echo  EF 60-65%. No source of embolus   cerebral angiogram today, Dr. Estanislado Pandy to perform   LDL 8.5  HgbA1c 12.9  SCDs for VTE prophylaxis Fall precautions  Diet NPO time specified   No antithrombotic prior to admission, now on aspirin 325 mg daily and clopidogrel 75 mg daily following plavix load. Given large vessel disease, plavix and aspirin x 3 months at time of discharge then aspirin alone  Treated with albumin 4 doses   Ongoing aggressive stroke risk factor management  At this time, do not suspect RCVS or cerebral vasculitis (received empiric pulse dose steroids) - f/u labs (ESR 13, RA latex turbid 10.5, ANCA ab negative, ANCA titers neg, RF 10.5, anti-jo a ab negative, extractable nuclear antigen ab neg, CRP 1.0, CL ab w/ IGM 15 (indeterminate), PT gene mut pending, Factor 5 leiden pending, homo neg, B2GP neg, Lupus AC neg, Prot C&S neg, anti III  85  Therapy recommendations:  No therapy needs  Disposition:  return home w/ family (pt works as a Company secretary)  Hypertension  Stable, decreasing  Permissive hypertension (OK if < 220/120) but gradually normalize in 5-7 days  Long-term BP goal normotensive  Hyperlipidemia  Home meds:  None  Now on Lipitor 80  LDL 129, goal < 70  Continue statin at discharge  Diabetes  HgbA1c 8.5, goal < 7.0  Uncontrolled  CBGs elevated  Other Stroke Risk Factors  Morbid Obesity, Body mass index is 49.72 kg/m., recommend weight loss, diet and exercise as appropriate. Pt s/p  lap band surgery 5 years ago   UDS level neg, ETOH level not performed  Home norethindrone d/c'd on admission  Family hx stroke (father at age 45, L brain stroke with R HP and severe expressive aphasia)  No hx Migraines  Consider OP eval for obstructive sleep apnea given morbid obesity  Hospital day # [place] for Pager information 07/04/2017 8:19 AM    To contact Stroke Continuity provider, please refer to [email or web address] After hours, contact General Neurology

## 2017-07-04 NOTE — Progress Notes (Addendum)
For detailed progress note, please refer to progress note done by Annie MainSharon Biby, NP, and signed by me.   Pt seen and examined in ICU. Pt underwent cerebral angiogram today with Dr. Corliss Skainseveshwar. Found to have left MCA inferior division 75%+ stenosis and 80% stenosis right M1. No apparent vasculitis, concerning for atherosclerosis.   At the end of the procedure, pt had vasovagal event while hold pressure at right groin. BP dampened and she had left MCA syndrome. With IV bolus, trendelenburg position and neo, her BP up tp 170s and symptoms resolved.   In ICU, pt still has mild expressive aphasia but that was her baseline yesterday. RUE and RLE strong 5/5. Currently BP 125/70 on neo, pt asymptomatic. However, discussed with CCM PA will increase neo and shoot for 140-160 to avoid any fluctuation. Will keep in ICU for observation overnight at least.   Also discussed with Dr. Corliss Skainseveshwar, if acute left MCA syndrome not able to recover with BP increase, she may need rescue stent. However, if she stabilized, may also consider left MCA stenting next Monday, given her young age, severe stroke risk factors, and potential disabling symptoms.   Marvel PlanJindong Ardelle Haliburton, MD PhD Stroke Neurology 07/04/2017 5:32 PM  This patient is critically ill due to left MCA infarct, b/l MCA stenosis, uncontrolled DM, hypertensive urgency, HLD and at significant risk of neurological worsening, death form recurrent stroke, hemorrhagic conversion, DKA, seizure, hypertensive emergency. This patient's care requires constant monitoring of vital signs, hemodynamics, respiratory and cardiac monitoring, review of multiple databases, neurological assessment, discussion with family, other specialists and medical decision making of high complexity. I spent 35 minutes of neurocritical care time in the care of this patient.

## 2017-07-04 NOTE — Procedures (Signed)
S/P 4 vessel cerebral arteriogram . RT CFA approach. Findings. 1.75%  + stenosis of prox dominant LT MCA inf division 2.Approx 80% stenosis of RT MCA distal M1 seg

## 2017-07-04 NOTE — Progress Notes (Signed)
Per verbal order from Dr. Roda ShuttersXu blood pressure of 140-150. Will continue to monitor. Dicie BeamFrazier, Lillyann Ahart RN BSN.

## 2017-07-04 NOTE — Sedation Documentation (Signed)
Vital signs stable. 

## 2017-07-04 NOTE — Progress Notes (Signed)
OT Cancellation Note  Patient Details Name: Debra [name] MRN: [id number] DOB: 09/21/1975   Cancelled Treatment:    Reason Eval/Treat Not Completed: Patient at procedure or test/ unavailable; will check back later today as schedule allows.   Marcy SirenBreanna Bellagrace Sylvan, OT Pager [phone] 07/04/2017   Orlando PennerBreanna L Reniah Cottingham 07/04/2017, 11:48 AM

## 2017-07-05 ENCOUNTER — Inpatient Hospital Stay: Payer: Self-pay

## 2017-07-05 DIAGNOSIS — G459 Transient cerebral ischemic attack, unspecified: Secondary | ICD-10-CM

## 2017-07-05 DIAGNOSIS — I959 Hypotension, unspecified: Secondary | ICD-10-CM

## 2017-07-05 LAB — BASIC METABOLIC PANEL
ANION GAP: 12 (ref 5–15)
BUN: <5 mg/dL — ABNORMAL LOW (ref 6–20)
CHLORIDE: 108 mmol/L (ref 101–111)
CO2: 19 mmol/L — ABNORMAL LOW (ref 22–32)
Calcium: 8.2 mg/dL — ABNORMAL LOW (ref 8.9–10.3)
Creatinine, Ser: 0.69 mg/dL (ref 0.44–1.00)
GFR calc non Af Amer: >60 mL/min (ref >60)
Glucose, Bld: 205 mg/dL — ABNORMAL HIGH (ref 65–99)
POTASSIUM: 3.4 mmol/L — AB (ref 3.5–5.1)
SODIUM: 139 mmol/L (ref 135–145)

## 2017-07-05 LAB — CBC
HCT: 42.1 % (ref 36.0–46.0)
HEMOGLOBIN: 13.5 g/dL (ref 12.0–15.0)
MCH: 27 pg (ref 26.0–34.0)
MCHC: 32.1 g/dL (ref 30.0–36.0)
MCV: 84.2 fL (ref 78.0–100.0)
Platelets: 340 10*3/uL (ref 150–400)
RBC: 5 MIL/uL (ref 3.87–5.11)
RDW: 13 % (ref 11.5–15.5)
WBC: 13.5 10*3/uL — AB (ref 4.0–10.5)

## 2017-07-05 LAB — PLATELET INHIBITION P2Y12: Platelet Function  P2Y12: 182 [PRU] — ABNORMAL LOW (ref 194–418)

## 2017-07-05 LAB — GLUCOSE, CAPILLARY
GLUCOSE-CAPILLARY: 213 mg/dL — AB (ref 65–99)
GLUCOSE-CAPILLARY: 274 mg/dL — AB (ref 65–99)
Glucose-Capillary: 211 mg/dL — ABNORMAL HIGH (ref 65–99)

## 2017-07-05 MED ORDER — CHLORHEXIDINE GLUCONATE CLOTH 2 % EX PADS
6.0000 | MEDICATED_PAD | Freq: Every day | CUTANEOUS | Status: DC
  Administered 2017-07-05 – 2017-07-08 (×3): 6 via TOPICAL

## 2017-07-05 MED ORDER — SODIUM CHLORIDE 0.9% FLUSH: 10.0000 mL | INTRAVENOUS | Status: DC | PRN

## 2017-07-05 MED ORDER — SODIUM CHLORIDE 0.9 % IV SOLN: INTRAVENOUS | Status: DC | PRN

## 2017-07-05 MED ORDER — SODIUM CHLORIDE 0.9 % IV SOLN
0.0000 ug/min | INTRAVENOUS | Status: DC
  Administered 2017-07-05: 270 ug/min via INTRAVENOUS
  Administered 2017-07-05: 50 ug/min via INTRAVENOUS
  Filled 2017-07-05 (×2): qty 4

## 2017-07-05 MED ORDER — SODIUM CHLORIDE 0.9% FLUSH
10.0000 mL | Freq: Two times a day (BID) | INTRAVENOUS | Status: DC
  Administered 2017-07-05: 20 mL
  Administered 2017-07-05 – 2017-07-06 (×2): 10 mL
  Administered 2017-07-06: 20 mL
  Administered 2017-07-07 – 2017-07-09 (×4): 10 mL

## 2017-07-05 MED ORDER — POTASSIUM CHLORIDE CRYS ER 20 MEQ PO TBCR
40.0000 meq | EXTENDED_RELEASE_TABLET | Freq: Once | ORAL | Status: AC
  Administered 2017-07-05: 40 meq via ORAL
  Filled 2017-07-05: qty 2

## 2017-07-05 NOTE — Progress Notes (Signed)
STROKE TEAM PROGRESS NOTE   History Per Note Consult 07/01/2017 Debra [name] is a 42 y.o. female with HTN, T2DM, Endometriosis s/p hysterectomy/BSO, and seasonal allergies who presented after developing slurred speech, expressive aphasia, and slight numbness in her right 1st and 2nd fingers. She said her symptoms started around 10 pm on 06/30/16 while at church. She felt that she understood what others would say to her, but could not respond appropriately.   She knew what she wanted to say, but could not formulate the words verbally or spelling correctly via texting. She had associated right frontal headache and some increased weakness of her right leg compared to baseline. She says she presented to [facility] ED around 10:45 pm and her symptoms began to improve after she was given medications for HTN and headache. She feels near her baseline now. She denies any change in vision, chest pain, palpitations, dyspnea, or similar episodes in the past.   She does not take any antiplatelet medications or statins. She reports adherence to losartan for blood pressure. She reports a family history of stroke in her father.  In the ED an initial CT head showed hypoattenuation in the left parieto-occipital junction which may represent acute or subacute Infarction.   CTA head/neck was obtained which showed changes in the anterior and posterior circulation thought to represent cerebral vasculitis vs RCVS/PRES without large vessel occlusion, aneurysm, or AVM.   Subsequent MRI Brain WO contrast showed a cluster of acute infarctions in the posterior left temporal lobe and temporoparietal junction region consistent with embolic infarctions in the left MCA territory which the radiologist felt could be an extremely localized manifestation of PRES, but thought this was less likely.   LKW: 10 pm 06/30/17 tpa given?: no, NIH 0 Premorbid modified rankin scale: 0 ICH Score: 0, no ICH NIHSS: 0   SUBJECTIVE (INTERVAL  HISTORY) Her sister is at bedside. Neo is running to keep BP within 150-160. Labile blood pressures. NIH 1 due to some word finding difficulty.  Cuff appears inaccurate will place an A line.   OBJECTIVE Vitals:   07/05/17 0820 07/05/17 0850 07/05/17 0900 07/05/17 0920  BP:      Pulse: 60 72 72 81  Resp: (!) 22   (!) 21  Temp:      TempSrc:      SpO2:    100%  Weight:      Height:        CBC:  Recent Labs  Lab 06/30/17 2325  07/03/17 1002 07/05/17 0349  WBC 9.3  --  9.6 13.5*  NEUTROABS 6.2  --   --   --   HGB 14.3   < > 13.3 13.5  HCT 44.4   < > 41.6 42.1  MCV 84.4  --  84.2 84.2  PLT 325  --  282 340   < > = values in this interval not displayed.    Basic Metabolic Panel:  Recent Labs  Lab 06/30/17 2325  07/03/17 1002 07/05/17 0349  NA 140   < > 137 139  K 3.5   < > 3.4* 3.4*  CL 107   < > 106 108  CO2 25  --  20* 19*  GLUCOSE 206*   < > 184* 205*  BUN 6   < > <5* <5*  CREATININE 0.91   < > 0.77 0.69  CALCIUM 8.7*  --  8.2* 8.2*  MG 1.8  --  1.7  --    < > =  values in this interval not displayed.    Lipid Panel:     Component Value Date/Time   CHOL 168 07/01/2017 0800   TRIG 60 07/01/2017 0800   HDL 27 (L) 07/01/2017 0800   CHOLHDL 6.2 07/01/2017 0800   VLDL 12 07/01/2017 0800   LDLCALC 129 (H) 07/01/2017 0800   HgbA1c:  Lab Results  Component Value Date   HGBA1C 8.5 (H) 07/01/2017   Urine Drug Screen:     Component Value Date/Time   LABOPIA NONE DETECTED 07/02/2017 0254   COCAINSCRNUR NONE DETECTED 07/02/2017 0254   LABBENZ NONE DETECTED 07/02/2017 0254   AMPHETMU NONE DETECTED 07/02/2017 0254   THCU NONE DETECTED 07/02/2017 0254   LABBARB NONE DETECTED 07/02/2017 0254    Alcohol Level No results found for: [place] Head Code Stroke Wo Contrast 06/30/2017 2329  1. Small 14 mm cortical focus of hypoattenuation within left parieto-occipital junction which may represent acute or subacute infarction. No hemorrhage or mass effect.   2. ASPECTS is 10   Ct Angio Head W Or Wo Contrast Ct Angio Neck W Or Wo Contrast 07/01/2017  1. Multiple segments of arterial stenosis in the anterior and posterior circulation which may represent cerebral vasculitis or possibly reversible cerebral vasoconstriction syndrome (RCVS)/PRES given posterior distribution cortical abnormality.  2. No large vessel occlusion, aneurysm, or vascular malformation.  3. Widely patent carotid and vertebral arteries in the neck.   Ct Head Wo Contrast 07/01/2017 Stable infarct in the medial left occipital lobe. No new area of decreased attenuation evident by noncontrast enhanced CT. Note the noncontrast enhanced CT from 1 day prior as well as the current examination demonstrates less extensive infarct than is demonstrated on MR obtained earlier in the day. No hemorrhage or mass evident.   Mr Brain Wo Contrast 07/01/2017 0736 Cluster of acute infarctions in the posterior left temporal lobe and temporoparietal junction region consistent with embolic infarctions in the left MCA territory. No other abnormal brain parenchymal finding. Whereas this could represent an extremely localized manifestation of posterior reversible encephalopathy, I think that is less likely.   Ct Angio Head W Or Wo Contrast 07/02/2017 1. Persistent segments of high-grade stenosis and right proximal M2 superior division and left proximal M2 inferior division as well as segments of mild stenosis and bilateral PCA.  2. No new large vessel occlusion or aneurysm of the intracranial circulation.   Ct Angio Neck W Or Wo Contrast 07/02/2017 Patent carotid and vertebral arteries. No dissection, aneurysm, or hemodynamically significant stenosis utilizing NASCET criteria.   Ct Cerebral Perfusion W Contrast 07/02/2017 1. No region of CBF (<30%) to indicate new acute infarction.  2. Areas of infarction of left posterior MCA distribution have pseudonormalized perfusion.  3. Region of T-max > 6 seconds  (8 cc) in adjacent left parietal lobe indicating penumbra.  4. Larger region of T-max > 4 seconds (57 cc) extending into left posterior frontal lobe compatible with oligemia.   Mr Brain Wo Contrast 07/03/2017  IMPRESSION: Patchy acute infarct in the left posterior MCA and watershed distributions, moderate to large in total extent.  Transthoracic Echocardiogram  07/03/2017 Study Conclusions - Left ventricle: The cavity size was normal. Systolic function was   normal. The estimated ejection fraction was in the range of 60%   to 65%. Wall motion was normal; there were no regional wall   motion abnormalities. Impressions: - No cardiac source of emboli was indentified.    GENERAL EXAM:  Vitals:   07/05/17  0820 07/05/17 0850 07/05/17 0900 07/05/17 0920  BP:      Pulse: 60 72 72 81  Resp: (!) 22   (!) 21  Temp:      TempSrc:      SpO2:    100%  Weight:      Height:        Patient is in no distress. Lying in bed with HOB at 30 degrees.  CARDIOVASCULAR: Regular rate and rhythm, no murmurs, no carotid bruits. Lungs clear.  NEUROLOGIC: MENTAL STATUS: awake, alert, speech hesitant with mild expressive aphasia with trouble naming and paraphasic errors. Comprehension mostly accurate, can follow 1&2 step commands well but difficulty with 3 step commands.  CRANIAL NERVE: decreased ability to count fingers RLQ but recognizes movement. extraocular muscles intact, no nystagmus, R lower facial weakness from yesterday resolved. facial sensation symmetric, no tongue deviation. MOTOR: normal bulk and tone, full strength on the Left. Proximal strength on R normal. Decreased FMM on the R with left arm orbiting right.  SENSORY: normal and symmetric to light touch, temperature. Sensation intact; denies finger numbness COORDINATION: finger-nose-finger, fine finger movements, heel-shin normal GAIT/STATION: deferred.   ASSESSMENT/PLAN Debra [name] is a 42 y.o. female with history of HTN, type  2 DM, obesity, endometriosis/ovarian cyst s/p hysterectomy/SBO and seasonal allergies presenting initially to [facility] with slurred speech and expressive aphasia. She did not receive IV t-PA due to NIH 0.   Stroke:   Left posterior temporal lobe infarcts L MCA territory in setting of uncontrolled risk factors and severe bilateral M2 intracranial stenosis, infarcts felt to be secondary to large vessel disease source  Resultant  Mild expressive and receptive aphasia, mild R hand clumsiness  Symptoms have remained stable over night   CT head Small 14 mm cortical focus of hypoattenuation within left parieto-occipital junction possible acute infarct, ASPECTS 10  CTA H&N on admission Multiple anterior and posterior stenosis, no ELVO  Repeat CT head w/ worsening stable left medial occipital lobe infarct, no new abnormalities.   MRI head cluster of acute infarct posterior left temporal lobe and temporoparietal junction region in the L MCA territory  Repeat CTA head, neck unchanged bilateram high grade M2 stenosis.   CTP with small perfusion deficit, not amenable to intervention  Repeat MRI - Patchy acute infarct in the left posterior MCA and watershed distributions  2D Echo - No cardiac source of emboli was indentified. EF 60-65%.  Cerebral angiogram 07/04/2017 - report pending  LDL 8.5  HgbA1c 12.9  SCDs for VTE prophylaxis  Diet Carb Modified Fluid consistency: Thin;   No antithrombotic prior to admission, now on aspirin 325 mg daily and clopidogrel 75 mg daily following plavix load yesterday. Given large vessel disease, anticipate plavix and aspirin x 3 months at time of discharge  Added albumin 5 x 4 doses yesterday for volume  Ongoing aggressive stroke risk factor management  At this time, do not suspect RCVS or cerebral vasculitis (received empiric pulse dose steroids) - f/u labs (ESR 13, RA latex turbid 10.5, , ANCA titers neg, RF 10.5, , extractable nuclear antigen ab neg, CRP 1.0,  CL ab w/ IGM 15 (indeterminate),  homo neg, B2GP neg, Lupus AC neg, Prot C&S neg, anti III  85  anti-jo a ab - normal  ANCA titers - normal  Factor 5 leiden - negative  PT gene mutation - negative   Therapy recommendations:  Pending - Ok to get OOB  Disposition:  Anticipate return home w/ family (pt  works as a Company secretary), await therapy evals  Hypertension  Elevated but Stable, 170s  Permissive hypertension (OK if < 220/120) but gradually normalize in 5-7 days  Ok to continue home antihypertensives  Long-term BP goal - may need slightly elevated BP due to advanced atherosclerosis.  Hyperlipidemia  Home meds:  None  Now on Lipitor 80  LDL 129, goal < 70  Continue statin at discharge  Diabetes  HgbA1c 8.5, goal < 7.0  Uncontrolled  CBGs elevated  Other Stroke Risk Factors  Morbid Obesity, Body mass index is 49.72 kg/m., recommend weight loss, diet and exercise as appropriate. Pt s/p lap band surgery 5 years ago   UDS level neg  Home norethindrone (oral contraceptive) d/c'd on admission  Family hx stroke (father at age 19, L brain stroke with R HP and severe expressive aphasia)  No hx Migraines  Consider OP eval for obstructive sleep apnea given morbid obesity  Hospital day # 4   Personally examined patient and images, and have participated in and made any corrections needed to history, physical, neuro exam,assessment and plan as stated above.  I have personally obtained the history, evaluated lab date, reviewed imaging studies and agree with radiology interpretations.    Debra Ill, MD Stroke Neurology   42 year old female with history hypertension, diabetes, obesity admitted for episode of aphasia, slow thought processing and right hand numbness as well as right temporal headache.  Symptom resolved on admission.  BP elevated.  CT [place] for left occipital stroke.  CTA of the neck showed bilateral M2 stenosis, questionable for our CVS or  intracranial stenosis.  MRI showed left inferior MCA patchy small infarcts.  LDL 129 and A1c 8.5.  UDS negative.  Currently on aspirin 325 and Lipitor 80.   However, symptoms fluctuate with expressive aphasia and slurred speech on sitting up yesterday. repeat CTA head and neck continue to show b/l M1 and M2 stenosis, as well as b/l PCA stenosis. MRI repeat showed extension of infarct at left MCA territory.   Pt condition still more likely advanced atherosclerosis intracranially given risk factors including morbid obesity, HTN, HLD, and DM. However, vasculitis still in DDx. Not able to do LP due to plavix use. Continue DAPT and high dose lipitor.Placing A line for better BP monitoring.     To contact Stroke Continuity provider, please refer to [email or web address] After hours, contact General Neurology

## 2017-07-05 NOTE — Progress Notes (Signed)
PULMONARY / CRITICAL CARE MEDICINE   Name: Debra [name] MRN: [id number] DOB: 03/25/1976    ADMISSION DATE:  06/30/2017 CONSULTATION DATE:  3/11  REFERRING MD:  Clent JacksLidzen   CHIEF COMPLAINT:  Hemodynamic management in acute CVA  HISTORY OF PRESENT ILLNESS:   This is a 42 year old female with a significant history of hypertension, diabetes type 2, And asthma.  Presented to the emergency room on 3/12 with a working diagnosis of PRES, manifesting as slurred speech, expressive aphasia and numbness on her right hand.  Subsequent imaging via MRI on 3/14 showed cluster of acute infarcts in the posterior left temporal lobe and temporoparietal junction felt to be most consistent with embolic infarct.  She had been admitted by the stroke team, and following imaging CT angiogram on 3/15 obtained demonstrated 75% plus stenosis of proximal dominant left MCA and 80% stenosis of the right MCA.  While holding pressure for sheath removal after arteriogram pt has acute vaso-vagal response w/ short change in MS that improved spontaneously. Based on these findings and concern for further risk of perfusion related neurovascular injury he was transferred to the intensive care.  He is planned for neurovascular procedure tentatively on 3/18 with hopes to stent the occluded arteries in the meantime pulmonary critical care has been asked to evaluate and manage blood pressure with goal systolic blood pressure 150 to 160   SUBJECTIVE:  Awake alert no focal deficits currently feels comfortable  VITAL SIGNS: BP (!) 192/80 (BP Location: Right Arm)   Pulse 74   Temp 98.5 F (36.9 C) (Oral)   Resp 14   Ht 5\' 8"  (1.727 m)   Wt (!) 148.3 kg (327 lb)   LMP 07/28/2011   SpO2 100%   BMI 49.72 kg/m   HEMODYNAMICS:    VENTILATOR SETTINGS:    INTAKE / OUTPUT: I/O last 3 completed shifts: In: 8860.5 [I.V.:8860.5] Out: 8850 [Urine:8850]  PHYSICAL EXAMINATION: General: Obese 42 year old female currently resting in the  supine position Neuro: Awake oriented speech clear no clear focal deficits currently HEENT: Normocephalic atraumatic no jugular venous distention Cardiovascular: The rate and rhythm without murmur rub or gallop Lungs: Clear to auscultation Abdomen: Soft nontender Musculoskeletal: Equal strength and bulk Skin: Warm and dry strong pulses  LABS:  BMET Recent Labs  Lab 06/30/17 2325 06/30/17 2331 07/03/17 1002 07/05/17 0349  NA 140 142 137 139  K 3.5 3.5 3.4* 3.4*  CL 107 106 106 108  CO2 25  --  20* 19*  BUN 6 4* <5* <5*  CREATININE 0.91 0.90 0.77 0.69  GLUCOSE 206* 208* 184* 205*    Electrolytes Recent Labs  Lab 06/30/17 2325 07/03/17 1002 07/05/17 0349  CALCIUM 8.7* 8.2* 8.2*  MG 1.8 1.7  --     CBC Recent Labs  Lab 06/30/17 2325 06/30/17 2331 07/03/17 1002 07/05/17 0349  WBC 9.3  --  9.6 13.5*  HGB 14.3 15.6* 13.3 13.5  HCT 44.4 46.0 41.6 42.1  PLT 325  --  282 340    Coag's Recent Labs  Lab 06/30/17 2325  APTT 27  INR 1.06    Sepsis Markers No results for input(s): LATICACIDVEN, PROCALCITON, O2SATVEN in the last 168 hours.  ABG No results for input(s): PHART, PCO2ART, PO2ART in the last 168 hours.  Liver Enzymes Recent Labs  Lab 06/30/17 2325  AST 16  ALT 18  ALKPHOS 60  BILITOT 0.4  ALBUMIN 3.6    Cardiac Enzymes No results for input(s): TROPONINI, PROBNP in the last  168 hours.  Glucose Recent Labs  Lab 07/03/17 1729 07/03/17 2255 07/04/17 0628 07/04/17 1145 07/04/17 1709 07/05/17 0820  GLUCAP 211* 205* 174* 137* 148* 211*    Imaging No results found.    SIGNIFICANT EVENTS  3/11  Admit for TIA  3/15  To Neuro IR for 4 vessel arteriogram   STUDIES 3/11 CTA Head/Neck >> multiple segments of arterial stenosis in the anterior and posterior circulation which may represent cerebral vasculitis or possibly reversible cerebral vasoconstriction syndrome (PRES).  No large vessel occlusion, widely patent carotid and vertebral  arteries in the neck  CTA Head/Neck 3/13 >> persistent segments of high grade stenosis and right proximal M2 superior division and left proximal M2  CT Perfusion 3/13 >> no region of CBF to indicate new infarction.  Areas of infarction of the left posterior MCA distribution have pseudonormalized perfusion Region of T-max > 6 seconds (8 cc) in adjacent left parietal lobe indicating penumbra.  Larger region of T-max > 4 seconds (57 cc) extending into left posterior frontal lobe compatible with oligemia. MRI 3/14 >> patchy acute infarct in the left posterior MCA & watershed distributions, moderat to large total extent 3/15  Arteriogram > 75%+ stenosis of proximal dominant left MCA, 80% stenosis of R MCA distal M1 segment   CULTURES   ANTIBIOTICS    DISCUSSION: Acute CVA, awaiting neurovascular intervention  ASSESSMENT / PLAN:  Acute left MCA/PCA stroke with symptoms exacerbated by transient hypotension and vasovagal response -Per IR angiogram showed 75% or more stenosis of proximal dominant left MCA and an 80% stenosis of the right MCA -Followed by stroke team and interventional radiology Plan Continue aspirin and Plavix Blood pressure goal greater than 150, using phenylephrine.  She will likely need a PICC line in order to continue at her current doses Other hypertensive control on hold Planning for stent procedure in IR on 3/18 Serial neuro checks Continue supportive care She asked me about a possible arterial line but I think this would be an unnecessary invasive intervention especially since she is on Plavix.  Transient vasovagal response, resolved Plan Follow telemetry   Fluid and electrolyte imbalance: Hypokalemia  plan Repeat supplementation 3/16  Borderline hyperglycemia Plan Follow CBG   Levy Pupa, MD, PhD 07/05/2017, 9:01 AM [facility] [phone] or if no answer [phone]

## 2017-07-05 NOTE — Progress Notes (Signed)
MD notified of patient being at maximum on neo and bp under parameter. RN will continue to monitor.

## 2017-07-05 NOTE — Progress Notes (Signed)
Peripherally Inserted Central Catheter/Midline Placement  The IV Nurse has discussed with the patient and/or persons authorized to consent for the patient, the purpose of this procedure and the potential benefits and risks involved with this procedure.  The benefits include less needle sticks, lab draws from the catheter, and the patient may be discharged home with the catheter. Risks include, but not limited to, infection, bleeding, blood clot (thrombus formation), and puncture of an artery; nerve damage and irregular heartbeat and possibility to perform a PICC exchange if needed/ordered by physician.  Alternatives to this procedure were also discussed.  Bard Power PICC patient education guide, fact sheet on infection prevention and patient information card has been provided to patient /or left at bedside.    PICC/Midline Placement Documentation  PICC Double Lumen 07/05/17 PICC Right Brachial 43 cm 1 cm (Active)  Indication for Insertion or Continuance of Line Vasoactive infusions 07/05/2017 11:17 AM  Exposed Catheter (cm) 1 cm 07/05/2017 11:17 AM  Site Assessment Clean;Dry;Intact 07/05/2017 11:17 AM  Lumen #1 Status Flushed;Saline locked;Blood return noted 07/05/2017 11:17 AM  Lumen #2 Status Flushed;Saline locked;Blood return noted 07/05/2017 11:17 AM  Dressing Type Transparent 07/05/2017 11:17 AM  Dressing Status Clean;Dry;Intact;Antimicrobial disc in place 07/05/2017 11:17 AM  Line Care Connections checked and tightened 07/05/2017 11:17 AM  Line Adjustment (NICU/IV Team Only) No 07/05/2017 11:17 AM  Dressing Intervention New dressing 07/05/2017 11:17 AM  Dressing Change Due 07/12/17 07/05/2017 11:17 AM       Debra [name], Debra [name] 07/05/2017, 11:18 AM

## 2017-07-05 NOTE — Procedures (Signed)
Arterial Catheter Insertion Procedure Note Marygrace Droughtiffany L Bellavance [id number] 06/24/1975  Procedure: Insertion of Arterial Catheter  Indications: Blood pressure monitoring  Procedure Details Consent: Risks of procedure as well as the alternatives and risks of each were explained to the (patient/caregiver).  Consent for procedure obtained. Time Out: Verified patient identification, verified procedure, site/side was marked, verified correct patient position, special equipment/implants available, medications/allergies/relevent history reviewed, required imaging and test results available.  Performed  Maximum sterile technique was used including antiseptics, cap, gloves, gown, hand hygiene, mask and sheet. Skin prep: Chlorhexidine; local anesthetic administered 22 gauge catheter was inserted into left radial artery using the Seldinger technique.  Evaluation Blood flow good; BP tracing good. Complications: No apparent complications.   Melanee Spryelson, Colbin Jovel Lawson 07/05/2017

## 2017-07-06 LAB — CBC
HCT: 39.1 % (ref 36.0–46.0)
HEMOGLOBIN: 12.5 g/dL (ref 12.0–15.0)
MCH: 27.1 pg (ref 26.0–34.0)
MCHC: 32 g/dL (ref 30.0–36.0)
MCV: 84.6 fL (ref 78.0–100.0)
PLATELETS: 284 10*3/uL (ref 150–400)
RBC: 4.62 MIL/uL (ref 3.87–5.11)
RDW: 13.4 % (ref 11.5–15.5)
WBC: 9.8 10*3/uL (ref 4.0–10.5)

## 2017-07-06 LAB — BASIC METABOLIC PANEL
ANION GAP: 7 (ref 5–15)
BUN: 7 mg/dL (ref 6–20)
CHLORIDE: 108 mmol/L (ref 101–111)
CO2: 20 mmol/L — AB (ref 22–32)
Calcium: 7.7 mg/dL — ABNORMAL LOW (ref 8.9–10.3)
Creatinine, Ser: 0.78 mg/dL (ref 0.44–1.00)
GFR calc non Af Amer: >60 mL/min (ref >60)
Glucose, Bld: 315 mg/dL — ABNORMAL HIGH (ref 65–99)
POTASSIUM: 3.7 mmol/L (ref 3.5–5.1)
SODIUM: 135 mmol/L (ref 135–145)

## 2017-07-06 LAB — GLUCOSE, CAPILLARY
GLUCOSE-CAPILLARY: 154 mg/dL — AB (ref 65–99)
GLUCOSE-CAPILLARY: 276 mg/dL — AB (ref 65–99)
Glucose-Capillary: 262 mg/dL — ABNORMAL HIGH (ref 65–99)

## 2017-07-06 NOTE — Progress Notes (Signed)
Neo-synephrine has been off since 1330. Pt's BP has been above target of 150-160 SBP, averaging in the 180s. Around 1500, BP spiked to 223/80. 10 mg labetalol given at 1510 to see how pt tolerated. Pt remained elevated in the 190s systolic, and the other 10 mg was given. Pt was briefly in the 160s, but has remained above 170 SBP since the Neo-synephrine was turned off. Spoke with Dr. Otelia LimesLindzen of neurology regarding pt's BP. He stated there was not a need to treat unless pt's BP rose above 220/110. Will continue to monitor and treat pt's BP as needed.

## 2017-07-06 NOTE — Progress Notes (Signed)
Off pressor as BP went above goal of 160 systolic. Per nursing pressure went up to 200 systolic and was treated with PRN labetalol. SBP now in 180s. Given the risk for ischemic stroke from hypoperfusion if BP drops too low, the best course of action is to continue to monitor and avoid excessive PRN antihypertensive use. Discussed with nursing staff. Will continue to monitor. Scheduled for 4-vessel angiogram with possible stenting tomorrow.   Electronically signed: Dr. Caryl PinaEric Merlon Alcorta

## 2017-07-06 NOTE — Progress Notes (Signed)
Informed signed consent obtained from patient and placed on the front of her shadow chart.

## 2017-07-06 NOTE — Progress Notes (Signed)
PULMONARY / CRITICAL CARE MEDICINE   Name: Debra [name] MRN: [id number] DOB: 11/14/1975    ADMISSION DATE:  06/30/2017 CONSULTATION DATE:  3/11  REFERRING MD:  Clent JacksLidzen   CHIEF COMPLAINT:  Hemodynamic management in acute CVA  HISTORY OF PRESENT ILLNESS:   This is a 42 year old female with a significant history of hypertension, diabetes type 2, And asthma.  Presented to the emergency room on 3/12 with a working diagnosis of PRES, manifesting as slurred speech, expressive aphasia and numbness on her right hand.  Subsequent imaging via MRI on 3/14 showed cluster of acute infarcts in the posterior left temporal lobe and temporoparietal junction felt to be most consistent with embolic infarct.  She had been admitted by the stroke team, and following imaging CT angiogram on 3/15 obtained demonstrated 75% plus stenosis of proximal dominant left MCA and 80% stenosis of the right MCA.  While holding pressure for sheath removal after arteriogram pt has acute vaso-vagal response w/ short change in MS that improved spontaneously. Based on these findings and concern for further risk of perfusion related neurovascular injury he was transferred to the intensive care.  He is planned for neurovascular procedure tentatively on 3/18 with hopes to stent the occluded arteries in the meantime pulmonary critical care has been asked to evaluate and manage blood pressure with goal systolic blood pressure 150 to 160   SUBJECTIVE:  Awake alert.  She reports her speech is improved today.  She tells me that she used her right hand to feed herself and although she was able to do so,  her coordination is still not at baseline.  She is still on a small dose of Neo-Synephrine.  VITAL SIGNS: BP (!) 153/85   Pulse 84   Temp 98.4 F (36.9 C) (Oral)   Resp (!) 21   Ht 5\' 8"  (1.727 m)   Wt (!) 327 lb (148.3 kg)   LMP 07/28/2011   SpO2 100%   BMI 49.72 kg/m   HEMODYNAMICS:    VENTILATOR SETTINGS:    INTAKE /  OUTPUT: I/O last 3 completed shifts: In: 10160.6 [P.O.:730; I.V.:9430.6] Out: 8250 [Urine:8250]  PHYSICAL EXAMINATION: General: Obese 42 year old female currently sitting up in a chair and appropriately interactive.  She is in no distress  Neuro: Awake oriented speech clear with occasional pausing to find words.  Is using all fours.   Cardiovascular: S1 and S2 are regular without murmur rub or gallop Lungs: Clear to auscultation Abdomen: Obese, soft nontender Musculoskeletal: Equal strength and bulk Skin: Warm and dry strong pulses  LABS:  BMET Recent Labs  Lab 07/03/17 1002 07/05/17 0349 07/06/17 1201  NA 137 139 135  K 3.4* 3.4* 3.7  CL 106 108 108  CO2 20* 19* 20*  BUN <5* <5* 7  CREATININE 0.77 0.69 0.78  GLUCOSE 184* 205* 315*    Electrolytes Recent Labs  Lab 06/30/17 2325 07/03/17 1002 07/05/17 0349 07/06/17 1201  CALCIUM 8.7* 8.2* 8.2* 7.7*  MG 1.8 1.7  --   --     CBC Recent Labs  Lab 07/03/17 1002 07/05/17 0349 07/06/17 1201  WBC 9.6 13.5* 9.8  HGB 13.3 13.5 12.5  HCT 41.6 42.1 39.1  PLT 282 340 284    Coag's Recent Labs  Lab 06/30/17 2325  APTT 27  INR 1.06    Sepsis Markers No results for input(s): LATICACIDVEN, PROCALCITON, O2SATVEN in the last 168 hours.  ABG No results for input(s): PHART, PCO2ART, PO2ART in the last 168 hours.  Liver Enzymes Recent Labs  Lab 06/30/17 2325  AST 16  ALT 18  ALKPHOS 60  BILITOT 0.4  ALBUMIN 3.6    Cardiac Enzymes No results for input(s): TROPONINI, PROBNP in the last 168 hours.  Glucose Recent Labs  Lab 07/04/17 1709 07/05/17 0820 07/05/17 1153 07/05/17 1759 07/06/17 0829 07/06/17 1232  GLUCAP 148* 211* 213* 274* 154* 276*    Imaging No results found.    SIGNIFICANT EVENTS  3/11  Admit for TIA  3/15  To Neuro IR for 4 vessel arteriogram   STUDIES 3/11 CTA Head/Neck >> multiple segments of arterial stenosis in the anterior and posterior circulation which may represent  cerebral vasculitis or possibly reversible cerebral vasoconstriction syndrome (PRES).  No large vessel occlusion, widely patent carotid and vertebral arteries in the neck  CTA Head/Neck 3/13 >> persistent segments of high grade stenosis and right proximal M2 superior division and left proximal M2  CT Perfusion 3/13 >> no region of CBF to indicate new infarction.  Areas of infarction of the left posterior MCA distribution have pseudonormalized perfusion Region of T-max > 6 seconds (8 cc) in adjacent left parietal lobe indicating penumbra.  Larger region of T-max > 4 seconds (57 cc) extending into left posterior frontal lobe compatible with oligemia. MRI 3/14 >> patchy acute infarct in the left posterior MCA & watershed distributions, moderat to large total extent 3/15  Arteriogram > 75%+ stenosis of proximal dominant left MCA, 80% stenosis of R MCA distal M1 segment   CULTURES   ANTIBIOTICS    DISCUSSION: Acute CVA, awaiting neurovascular intervention with MCA stent placement anticipated for 3/18   ASSESSMENT / PLAN:  Acute left MCA/PCA stroke with symptoms exacerbated by transient hypotension and vasovagal response -Per IR angiogram showed 75% or more stenosis of proximal dominant left MCA and an 80% stenosis of the right MCA -Followed by stroke team and interventional radiology Plan Continue aspirin and Plavix Blood pressure goal greater than 150, using phenylephrine.  She will likely need a PICC line in order to continue at her current doses Other hypertensive control on hold Planning for stent procedure in IR on 3/18 Serial neuro checks Continue supportive care  Transient vasovagal response, resolved Plan Follow telemetry   Fluid and electrolyte imbalance: Hypokalemia  plan Repeat supplementation 3/16  Borderline hyperglycemia Plan Follow [facility]   Penny Pia, MD 07/06/2017, 1:44 PM [facility] [phone] or if no answer [phone]

## 2017-07-06 NOTE — Progress Notes (Addendum)
Referring Physician(s): Dr Lavera Guise  Supervising Physician: Luanne Bras  Patient Status:  [facility] - In-pt  Chief Complaint:  CVA  Subjective:  22: S/P 4 vessel cerebral arteriogram . RT CFA approach. Findings. 1.75%  + stenosis of prox dominant LT MCA inf division 2.Approx 80% stenosis of RT MCA distal M1 seg    Pt is up in bed RN says has ambulated in room and to bathroom Using all 4s Speech still hesitates; but coherent  Scheduled for left middle cerebral artery angioplasty-stent in IR with Dr Estanislado Pandy [phone]    Allergies: Influenza vaccines; Metformin and related; Morphine; and Augmentin [amoxicillin-pot clavulanate]  Medications: Prior to Admission medications   Medication Sig Start Date End Date Taking? Authorizing Provider  albuterol (PROAIR HFA) 108 (90 BASE) MCG/ACT inhaler Inhale 2 puffs into the lungs every 4 (four) hours as needed for wheezing or shortness of breath. 04/18/15  Yes Rowe Clack, MD  albuterol (PROVENTIL) (2.5 MG/3ML) 0.083% nebulizer solution Take 2.5 mg by nebulization every 6 (six) hours as needed for wheezing or shortness of breath.   Yes [provider]  blood glucose meter kit and supplies Dispense based on patient and insurance preference. Use up to four times daily as directed. (FOR ICD-9 250.00, 250.01). 06/03/14  Yes Rowe Clack, MD  diphenhydrAMINE (BENADRYL) 25 mg capsule Take 1 capsule (25 mg total) by mouth every 6 (six) hours as needed. Patient taking differently: Take 25 mg by mouth every 6 (six) hours as needed for itching or allergies.  07/13/16  Yes Shary Decamp, PA-C  fexofenadine (ALLEGRA) 180 MG tablet Take 180 mg by mouth daily as needed. For seasonal allergies   Yes [provider]  glipiZIDE (GLUCOTROL) 5 MG tablet TAKE 1 TABLET(5 MG) BY MOUTH TWICE DAILY BEFORE A MEAL 05/15/17  Yes Hoyt Koch, MD  glucose blood (ONE TOUCH ULTRA TEST) test strip Use as directed 10/28/13  Yes Rowe Clack, MD  losartan (COZAAR) 100 MG tablet TAKE 1 TABLET BY MOUTH DAILY 06/02/17  Yes Hoyt Koch, MD  mometasone (NASONEX) 50 MCG/ACT nasal spray Place 2 sprays into the nose daily as needed. For nasal spray   Yes [provider]  norethindrone (AYGESTIN) 5 MG tablet Take 5 mg by mouth daily.  07/06/13  Yes [provider]  TRULICITY 1.5 JI/9.6VE SOPN INJECT 1.5 MG INTO THE SKIN ONCE A WEEK 12/24/16  Yes Hoyt Koch, MD  benzonatate (TESSALON) 200 MG capsule Take 1 capsule (200 mg total) by mouth 3 (three) times daily as needed. Swallow whole-do not bite pill Patient not taking: Reported on 06/30/2017 04/19/17   Tower, Wynelle Fanny, MD  dextromethorphan-guaiFENesin [facility] DM) 30-600 MG 12hr tablet Take 1 tablet by mouth 2 (two) times daily as needed for cough. Patient not taking: Reported on 06/30/2017 02/18/17   Nche, Charlene Brooke, NP  doxycycline (VIBRA-TABS) 100 MG tablet Take 1 tablet (100 mg total) by mouth 2 (two) times daily. Patient not taking: Reported on 06/30/2017 04/24/17   Biagio Borg, MD  empagliflozin (JARDIANCE) 25 MG TABS tablet Take 25 mg by mouth daily. Patient not taking: Reported on 06/30/2017 04/24/17   Biagio Borg, MD  fluticasone [facility]) 50 MCG/ACT nasal spray Place 2 sprays into both nostrils daily. Patient not taking: Reported on 06/30/2017 09/23/16   Delano Metz, FNP  Fluticasone-Salmeterol (ADVAIR DISKUS) 250-50 MCG/DOSE AEPB Inhale 1 puff into the lungs 2 (two) times daily. Patient not taking: Reported on 06/30/2017  07/22/11   Rowe Clack, MD  HYDROcodone-homatropine (HYCODAN) 5-1.5 MG/5ML syrup Take 5 mLs by mouth every 6 (six) hours as needed for cough. Patient not taking: Reported on 06/30/2017 02/18/17   Nche, Charlene Brooke, NP  olopatadine (PATANOL) 0.1 % ophthalmic solution Place 1 drop into both eyes 2 (two) times daily. Patient not taking: Reported on 06/30/2017 08/08/12   Darlyne Russian, MD  sodium chloride (OCEAN)  0.65 % SOLN nasal spray Place 1 spray into both nostrils as needed for congestion. Patient not taking: Reported on 06/30/2017 02/18/17   Nche, Charlene Brooke, NP     Vital Signs: BP (!) 153/85   Pulse 65   Temp 98.4 F (36.9 C) (Oral)   Resp 20   Ht _0  (1.727 m)   Wt (!) 327 lb (148.3 kg)   LMP 07/28/2011   SpO2 98%   BMI 49.72 kg/m   Physical Exam  Constitutional: She is oriented to person, place, and time.  HENT:  Head: Atraumatic.  Speech with minimal hesitance---but coherent and clear  Eyes: EOM are normal.  Neck: Neck supple.  Cardiovascular: Normal rate, regular rhythm and normal heart sounds.  Pulmonary/Chest: Effort normal and breath sounds normal. She has no wheezes.  Abdominal: Soft. Bowel sounds are normal. There is no tenderness.  Musculoskeletal: Normal range of motion.  Neurological: She is alert and oriented to person, place, and time.  Skin: Skin is warm and dry.  Psychiatric: She has a normal mood and affect. Her behavior is normal. Judgment and thought content normal.  Nursing note and vitals reviewed.   Imaging: Ct Angio Head W Or Wo Contrast  Addendum Date: 07/02/2017   ADDENDUM REPORT: 07/02/2017 14:20 ADDENDUM: These results were called by telephone at the time of interpretation on 07/02/2017 at 2:20 pm to Dr. Rosalin Hawking , who verbally acknowledged these results. Electronically Signed   By: Kristine Garbe M.D.   On: 07/02/2017 14:20   Result Date: 07/02/2017 CLINICAL DATA:  42 y/o F; right facial droop and aphasia. Waxing and waning with original symptoms since arrival. EXAM: CT ANGIOGRAPHY HEAD AND NECK CT PERFUSION BRAIN TECHNIQUE: Multidetector CT imaging of the head and neck was performed using the standard protocol during bolus administration of intravenous contrast. Multiplanar CT image reconstructions and MIPs were obtained to evaluate the vascular anatomy. Carotid stenosis measurements (when applicable) are obtained utilizing NASCET  criteria, using the distal internal carotid diameter as the denominator. Multiphase CT imaging of the brain was performed following IV bolus contrast injection. Subsequent parametric perfusion maps were calculated using RAPID software. CONTRAST:  4m ISOVUE-370 IOPAMIDOL (ISOVUE-370) INJECTION 76% COMPARISON:  07/01/2017 CT and MRI head. 06/30/2017 CT head and CTA head and neck. FINDINGS: CTA NECK FINDINGS Aortic arch: Bovine variant branching. Imaged portion shows no evidence of aneurysm or dissection. No significant stenosis of the major arch vessel origins. Right carotid system: No evidence of dissection, stenosis (50% or greater) or occlusion. Left carotid system: No evidence of dissection, stenosis (50% or greater) or occlusion. Vertebral arteries: Codominant. No evidence of dissection, stenosis (50% or greater) or occlusion. Skeleton: Negative. Other neck: Stable right floor of mouth partially visualized fat containing structure measuring approximately 14 mm, probably lipoma. Upper chest: Negative. Review of the MIP images confirms the above findings CTA HEAD FINDINGS Anterior circulation: Segments of stenosis again seen best appreciated in the right proximal M2 superior division at left proximal M2 inferior division. No large vessel occlusion, aneurysm, or vascular malformation identified. Posterior circulation:  Stable segments of stenosis in bilateral proximal PCA. No large vessel occlusion, aneurysm, or vascular malformation identified. Venous sinuses: As permitted by contrast timing, patent. Anatomic variants: Patent circle-of-Willis. Delayed phase: No abnormal intracranial enhancement. Few additional small foci of hypoattenuation are present within the left parietal lobe cortex and subcortical white matter in comparison with original CT, with corresponding areas of reduced diffusion on MR, likely representing areas of infarction (series 14 image 18-21). Review of the MIP images confirms the above  findings CT Brain Perfusion Findings: CBF (<30%) Volume: 50m Perfusion (Tmax>6.0s) volume: 821mMismatch Volume: 98m17mnfarction Location:Lack of perfusion anomaly in left posterior MCA region of infarction is likely due to pseudonormalization. Region of T-max > 6 seconds (8 cc) is present more superiorly in the adjacent left parietal lobe indicating area of at-risk ischemia. A larger region of T-max > 4 seconds (57 cc) extends into the left posterior frontal lobe and may be present in the right watershed compatible with oligemia. IMPRESSION: CT perfusion: 1. No region of CBF (<30%) to indicate new acute infarction. 2. Areas of infarction of left posterior MCA distribution have pseudonormalized perfusion. 3. Region of T-max > 6 seconds (8 cc) in adjacent left parietal lobe indicating penumbra. 4. Larger region of T-max > 4 seconds (57 cc) extending into left posterior frontal lobe compatible with oligemia. CTA head: 1. Persistent segments of high-grade stenosis and right proximal M2 superior division and left proximal M2 inferior division as well as segments of mild stenosis and bilateral PCA. 2. No new large vessel occlusion or aneurysm of the intracranial circulation. CTA neck: Patent carotid and vertebral arteries. No dissection, aneurysm, or hemodynamically significant stenosis utilizing NASCET criteria. Electronically Signed: By: LanKristine GarbeD. On: 07/02/2017 14:06   Ct Angio Neck W Or Wo Contrast  Addendum Date: 07/02/2017   ADDENDUM REPORT: 07/02/2017 14:20 ADDENDUM: These results were called by telephone at the time of interpretation on 07/02/2017 at 2:20 pm to Dr. JINRosalin Hawkingwho verbally acknowledged these results. Electronically Signed   By: LanKristine GarbeD.   On: 07/02/2017 14:20   Result Date: 07/02/2017 CLINICAL DATA:  42 35o F; right facial droop and aphasia. Waxing and waning with original symptoms since arrival. EXAM: CT ANGIOGRAPHY HEAD AND NECK CT PERFUSION BRAIN  TECHNIQUE: Multidetector CT imaging of the head and neck was performed using the standard protocol during bolus administration of intravenous contrast. Multiplanar CT image reconstructions and MIPs were obtained to evaluate the vascular anatomy. Carotid stenosis measurements (when applicable) are obtained utilizing NASCET criteria, using the distal internal carotid diameter as the denominator. Multiphase CT imaging of the brain was performed following IV bolus contrast injection. Subsequent parametric perfusion maps were calculated using RAPID software. CONTRAST:  44m53mOVUE-370 IOPAMIDOL (ISOVUE-370) INJECTION 76% COMPARISON:  07/01/2017 CT and MRI head. 06/30/2017 CT head and CTA head and neck. FINDINGS: CTA NECK FINDINGS Aortic arch: Bovine variant branching. Imaged portion shows no evidence of aneurysm or dissection. No significant stenosis of the major arch vessel origins. Right carotid system: No evidence of dissection, stenosis (50% or greater) or occlusion. Left carotid system: No evidence of dissection, stenosis (50% or greater) or occlusion. Vertebral arteries: Codominant. No evidence of dissection, stenosis (50% or greater) or occlusion. Skeleton: Negative. Other neck: Stable right floor of mouth partially visualized fat containing structure measuring approximately 14 mm, probably lipoma. Upper chest: Negative. Review of the MIP images confirms the above findings CTA HEAD FINDINGS Anterior circulation: Segments of stenosis again seen best appreciated  in the right proximal M2 superior division at left proximal M2 inferior division. No large vessel occlusion, aneurysm, or vascular malformation identified. Posterior circulation: Stable segments of stenosis in bilateral proximal PCA. No large vessel occlusion, aneurysm, or vascular malformation identified. Venous sinuses: As permitted by contrast timing, patent. Anatomic variants: Patent circle-of-Willis. Delayed phase: No abnormal intracranial enhancement.  Few additional small foci of hypoattenuation are present within the left parietal lobe cortex and subcortical white matter in comparison with original CT, with corresponding areas of reduced diffusion on MR, likely representing areas of infarction (series 14 image 18-21). Review of the MIP images confirms the above findings CT Brain Perfusion Findings: CBF (<30%) Volume: 13m Perfusion (Tmax>6.0s) volume: 853mMismatch Volume: 17m67mnfarction Location:Lack of perfusion anomaly in left posterior MCA region of infarction is likely due to pseudonormalization. Region of T-max > 6 seconds (8 cc) is present more superiorly in the adjacent left parietal lobe indicating area of at-risk ischemia. A larger region of T-max > 4 seconds (57 cc) extends into the left posterior frontal lobe and may be present in the right watershed compatible with oligemia. IMPRESSION: CT perfusion: 1. No region of CBF (<30%) to indicate new acute infarction. 2. Areas of infarction of left posterior MCA distribution have pseudonormalized perfusion. 3. Region of T-max > 6 seconds (8 cc) in adjacent left parietal lobe indicating penumbra. 4. Larger region of T-max > 4 seconds (57 cc) extending into left posterior frontal lobe compatible with oligemia. CTA head: 1. Persistent segments of high-grade stenosis and right proximal M2 superior division and left proximal M2 inferior division as well as segments of mild stenosis and bilateral PCA. 2. No new large vessel occlusion or aneurysm of the intracranial circulation. CTA neck: Patent carotid and vertebral arteries. No dissection, aneurysm, or hemodynamically significant stenosis utilizing NASCET criteria. Electronically Signed: By: LanKristine GarbeD. On: 07/02/2017 14:06   Mr Brain Wo Contrast  Result Date: 07/03/2017 CLINICAL DATA:  Headache.  Difficulty verbalizing thoughts. EXAM: MRI HEAD WITHOUT CONTRAST TECHNIQUE: Multiplanar, multiecho pulse sequences of the brain and surrounding  structures were obtained without intravenous contrast. COMPARISON:  Head CT and CTA from yesterday. FINDINGS: Diffusion only imaging was requested. There is patchy acute infarct in the left posterior frontal, parietal, superficial temporal and far lateral occipital cortex with white matter extension to the atrium of the left lateral ventricle. Patchy left corona radiata infarct. No midline shift. No gross susceptibility artifact. IMPRESSION: Patchy acute infarct in the left posterior MCA and watershed distributions, moderate to large in total extent. Electronically Signed   By: JonMonte FantasiaD.   On: 07/03/2017 15:15   Ct Cerebral Perfusion W Contrast  Addendum Date: 07/02/2017   ADDENDUM REPORT: 07/02/2017 14:20 ADDENDUM: These results were called by telephone at the time of interpretation on 07/02/2017 at 2:20 pm to Dr. JINRosalin Hawkingwho verbally acknowledged these results. Electronically Signed   By: LanKristine GarbeD.   On: 07/02/2017 14:20   Result Date: 07/02/2017 CLINICAL DATA:  [age] F; right facial droop and aphasia. Waxing and waning with original symptoms since arrival. EXAM: CT ANGIOGRAPHY HEAD AND NECK CT PERFUSION BRAIN TECHNIQUE: Multidetector CT imaging of the head and neck was performed using the standard protocol during bolus administration of intravenous contrast. Multiplanar CT image reconstructions and MIPs were obtained to evaluate the vascular anatomy. Carotid stenosis measurements (when applicable) are obtained utilizing NASCET criteria, using the distal internal carotid diameter as the denominator. Multiphase CT imaging of the brain  was performed following IV bolus contrast injection. Subsequent parametric perfusion maps were calculated using RAPID software. CONTRAST:  53m ISOVUE-370 IOPAMIDOL (ISOVUE-370) INJECTION 76% COMPARISON:  07/01/2017 CT and MRI head. 06/30/2017 CT head and CTA head and neck. FINDINGS: CTA NECK FINDINGS Aortic arch: Bovine variant branching.  Imaged portion shows no evidence of aneurysm or dissection. No significant stenosis of the major arch vessel origins. Right carotid system: No evidence of dissection, stenosis (50% or greater) or occlusion. Left carotid system: No evidence of dissection, stenosis (50% or greater) or occlusion. Vertebral arteries: Codominant. No evidence of dissection, stenosis (50% or greater) or occlusion. Skeleton: Negative. Other neck: Stable right floor of mouth partially visualized fat containing structure measuring approximately 14 mm, probably lipoma. Upper chest: Negative. Review of the MIP images confirms the above findings CTA HEAD FINDINGS Anterior circulation: Segments of stenosis again seen best appreciated in the right proximal M2 superior division at left proximal M2 inferior division. No large vessel occlusion, aneurysm, or vascular malformation identified. Posterior circulation: Stable segments of stenosis in bilateral proximal PCA. No large vessel occlusion, aneurysm, or vascular malformation identified. Venous sinuses: As permitted by contrast timing, patent. Anatomic variants: Patent circle-of-Willis. Delayed phase: No abnormal intracranial enhancement. Few additional small foci of hypoattenuation are present within the left parietal lobe cortex and subcortical white matter in comparison with original CT, with corresponding areas of reduced diffusion on MR, likely representing areas of infarction (series 14 image 18-21). Review of the MIP images confirms the above findings CT Brain Perfusion Findings: CBF (<30%) Volume: 09mPerfusion (Tmax>6.0s) volume: 64m41mismatch Volume: 64mL37mfarction Location:Lack of perfusion anomaly in left posterior MCA region of infarction is likely due to pseudonormalization. Region of T-max > 6 seconds (8 cc) is present more superiorly in the adjacent left parietal lobe indicating area of at-risk ischemia. A larger region of T-max > 4 seconds (57 cc) extends into the left posterior  frontal lobe and may be present in the right watershed compatible with oligemia. IMPRESSION: CT perfusion: 1. No region of CBF (<30%) to indicate new acute infarction. 2. Areas of infarction of left posterior MCA distribution have pseudonormalized perfusion. 3. Region of T-max > 6 seconds (8 cc) in adjacent left parietal lobe indicating penumbra. 4. Larger region of T-max > 4 seconds (57 cc) extending into left posterior frontal lobe compatible with oligemia. CTA head: 1. Persistent segments of high-grade stenosis and right proximal M2 superior division and left proximal M2 inferior division as well as segments of mild stenosis and bilateral PCA. 2. No new large vessel occlusion or aneurysm of the intracranial circulation. CTA neck: Patent carotid and vertebral arteries. No dissection, aneurysm, or hemodynamically significant stenosis utilizing NASCET criteria. Electronically Signed: By: LancKristine Garbe. On: 07/02/2017 14:06   [place] Site Rite  Result Date: 07/05/2017 If Site Rite image not attached, placement could not be confirmed due to current cardiac rhythm.   Labs:  CBC: Recent Labs    06/30/17 2325 06/30/17 2331 07/03/17 1002 07/05/17 0349  WBC 9.3  --  9.6 13.5*  HGB 14.3 15.6* 13.3 13.5  HCT 44.4 46.0 41.6 42.1  PLT 325  --  282 340    COAGS: Recent Labs    06/30/17 2325  INR 1.06  APTT 27    BMP: Recent Labs    06/30/17 2325 06/30/17 2331 07/03/17 1002 07/05/17 0349  NA 140 142 137 139  K 3.5 3.5 3.4* 3.4*  CL 107 106 106 108  CO2 25  --  20* 19*  GLUCOSE 206* 208* 184* 205*  BUN 6 4* <5* <5*  CALCIUM 8.7*  --  8.2* 8.2*  CREATININE 0.91 0.90 0.77 0.69  GFRNONAA >60  --  >60 >60  GFRAA >60  --  >60 >60    LIVER FUNCTION TESTS: Recent Labs    06/30/17 2325  BILITOT 0.4  AST 16  ALT 18  ALKPHOS 60  PROT 7.1  ALBUMIN 3.6    Assessment and Plan:  CVA L MCA stenosis Scheduled for cerebral arteriogram with L MCA angioplasty- stent  placement in am  Risks and benefits of cerebral angiogram with intervention were discussed with the patient including, but not limited to bleeding, infection, vascular injury, contrast induced renal failure, stroke or even death. This interventional procedure involves the use of X-rays and because of the nature of the planned procedure, it is possible that we will have prolonged use of X-ray fluoroscopy. Potential radiation risks to you include (but are not limited to) the following: - A slightly elevated risk for cancer  several years later in life. This risk is typically less than 0.5% percent. This risk is low in comparison to the normal incidence of human cancer, which is 33% for women and 50% for men according to the [place]. - Radiation induced injury can include skin redness, resembling a rash, tissue breakdown / ulcers and hair loss (which can be temporary or permanent).  The likelihood of either of these occurring depends on the difficulty of the procedure and whether you are sensitive to radiation due to previous procedures, disease, or genetic conditions.  IF your procedure requires a prolonged use of radiation, you will be notified and given written instructions for further action.  It is your responsibility to monitor the irradiated area for the 2 weeks following the procedure and to notify your physician if you are concerned that you have suffered a radiation induced injury.    All of the patient's questions were answered, patient is agreeable to proceed. Consent signed and in chart.  Electronically Signed: Lavonia Drafts, PA-C 07/06/2017, 8:47 AM   I spent a total of 25 Minutes at the the patient's bedside AND on the patient's hospital floor or unit, greater than 50% of which was counseling/coordinating care for L MCA angioplasty-stent

## 2017-07-06 NOTE — Progress Notes (Signed)
STROKE TEAM PROGRESS NOTE   History Per Note Consult 07/01/2017 Debra [name] is a 42 y.o. female with HTN, T2DM, Endometriosis s/p hysterectomy/BSO, and seasonal allergies who presented after developing slurred speech, expressive aphasia, and slight numbness in her right 1st and 2nd fingers. She said her symptoms started around 10 pm on 06/30/16 while at church. She felt that she understood what others would say to her, but could not respond appropriately.   She knew what she wanted to say, but could not formulate the words verbally or spelling correctly via texting. She had associated right frontal headache and some increased weakness of her right leg compared to baseline. She says she presented to [facility] ED around 10:45 pm and her symptoms began to improve after she was given medications for HTN and headache. She feels near her baseline now. She denies any change in vision, chest pain, palpitations, dyspnea, or similar episodes in the past.   She does not take any antiplatelet medications or statins. She reports adherence to losartan for blood pressure. She reports a family history of stroke in her father.  In the ED an initial CT head showed hypoattenuation in the left parieto-occipital junction which may represent acute or subacute Infarction.   CTA head/neck was obtained which showed changes in the anterior and posterior circulation thought to represent cerebral vasculitis vs RCVS/PRES without large vessel occlusion, aneurysm, or AVM.   Subsequent MRI Brain WO contrast showed a cluster of acute infarctions in the posterior left temporal lobe and temporoparietal junction region consistent with embolic infarctions in the left MCA territory which the radiologist felt could be an extremely localized manifestation of PRES, but thought this was less likely.   LKW: 10 pm 06/30/17 tpa given?: no, NIH 0 Premorbid modified rankin scale: 0 ICH Score: 0, no ICH NIHSS: 0   SUBJECTIVE (INTERVAL  HISTORY) Her huisband is at bedside. Neo is running to keep BP within 150-160.   blood pressures now stable Still has e to some word finding difficulty.    OBJECTIVE Vitals:   07/06/17 0935 07/06/17 1000 07/06/17 1015 07/06/17 1030  BP:      Pulse: 88 83 86 75  Resp: (!) 27  18 (!) 25  Temp:      TempSrc:      SpO2: 100% 100% 100% 98%  Weight:      Height:        CBC:  Recent Labs  Lab 06/30/17 2325  07/03/17 1002 07/05/17 0349  WBC 9.3  --  9.6 13.5*  NEUTROABS 6.2  --   --   --   HGB 14.3   < > 13.3 13.5  HCT 44.4   < > 41.6 42.1  MCV 84.4  --  84.2 84.2  PLT 325  --  282 340   < > = values in this interval not displayed.    Basic Metabolic Panel:  Recent Labs  Lab 06/30/17 2325  07/03/17 1002 07/05/17 0349  NA 140   < > 137 139  K 3.5   < > 3.4* 3.4*  CL 107   < > 106 108  CO2 25  --  20* 19*  GLUCOSE 206*   < > 184* 205*  BUN 6   < > <5* <5*  CREATININE 0.91   < > 0.77 0.69  CALCIUM 8.7*  --  8.2* 8.2*  MG 1.8  --  1.7  --    < > = values in this interval not  displayed.    Lipid Panel:     Component Value Date/Time   CHOL 168 07/01/2017 0800   TRIG 60 07/01/2017 0800   HDL 27 (L) 07/01/2017 0800   CHOLHDL 6.2 07/01/2017 0800   VLDL 12 07/01/2017 0800   LDLCALC 129 (H) 07/01/2017 0800   HgbA1c:  Lab Results  Component Value Date   HGBA1C 8.5 (H) 07/01/2017   Urine Drug Screen:     Component Value Date/Time   LABOPIA NONE DETECTED 07/02/2017 0254   COCAINSCRNUR NONE DETECTED 07/02/2017 0254   LABBENZ NONE DETECTED 07/02/2017 0254   AMPHETMU NONE DETECTED 07/02/2017 0254   THCU NONE DETECTED 07/02/2017 0254   LABBARB NONE DETECTED 07/02/2017 0254    Alcohol Level No results found for: [place] Head Code Stroke Wo Contrast 06/30/2017 2329  1. Small 14 mm cortical focus of hypoattenuation within left parieto-occipital junction which may represent acute or subacute infarction. No hemorrhage or mass effect.  2. ASPECTS is 10   Ct  Angio Head W Or Wo Contrast Ct Angio Neck W Or Wo Contrast 07/01/2017  1. Multiple segments of arterial stenosis in the anterior and posterior circulation which may represent cerebral vasculitis or possibly reversible cerebral vasoconstriction syndrome (RCVS)/PRES given posterior distribution cortical abnormality.  2. No large vessel occlusion, aneurysm, or vascular malformation.  3. Widely patent carotid and vertebral arteries in the neck.   Ct Head Wo Contrast 07/01/2017 Stable infarct in the medial left occipital lobe. No new area of decreased attenuation evident by noncontrast enhanced CT. Note the noncontrast enhanced CT from 1 day prior as well as the current examination demonstrates less extensive infarct than is demonstrated on MR obtained earlier in the day. No hemorrhage or mass evident.   Mr Brain Wo Contrast 07/01/2017 0736 Cluster of acute infarctions in the posterior left temporal lobe and temporoparietal junction region consistent with embolic infarctions in the left MCA territory. No other abnormal brain parenchymal finding. Whereas this could represent an extremely localized manifestation of posterior reversible encephalopathy, I think that is less likely.   Ct Angio Head W Or Wo Contrast 07/02/2017 1. Persistent segments of high-grade stenosis and right proximal M2 superior division and left proximal M2 inferior division as well as segments of mild stenosis and bilateral PCA.  2. No new large vessel occlusion or aneurysm of the intracranial circulation.   Ct Angio Neck W Or Wo Contrast 07/02/2017 Patent carotid and vertebral arteries. No dissection, aneurysm, or hemodynamically significant stenosis utilizing NASCET criteria.   Ct Cerebral Perfusion W Contrast 07/02/2017 1. No region of CBF (<30%) to indicate new acute infarction.  2. Areas of infarction of left posterior MCA distribution have pseudonormalized perfusion.  3. Region of T-max > 6 seconds (8 cc) in adjacent left  parietal lobe indicating penumbra.  4. Larger region of T-max > 4 seconds (57 cc) extending into left posterior frontal lobe compatible with oligemia.   Mr Brain Wo Contrast 07/03/2017  IMPRESSION: Patchy acute infarct in the left posterior MCA and watershed distributions, moderate to large in total extent.  Transthoracic Echocardiogram  07/03/2017 Study Conclusions - Left ventricle: The cavity size was normal. Systolic function was   normal. The estimated ejection fraction was in the range of 60%   to 65%. Wall motion was normal; there were no regional wall   motion abnormalities. Impressions: - No cardiac source of emboli was indentified.    GENERAL EXAM:  Vitals:   07/06/17 0935 07/06/17 1000 07/06/17 1015  07/06/17 1030  BP:      Pulse: 88 83 86 75  Resp: (!) 27  18 (!) 25  Temp:      TempSrc:      SpO2: 100% 100% 100% 98%  Weight:      Height:        Patient is in no distress.obese middle aged lady  Lying in bed with HOB at 30 degrees.  CARDIOVASCULAR: Regular rate and rhythm, no murmurs, no carotid bruits. Lungs clear.  NEUROLOGIC: MENTAL STATUS: awake, alert, speech hesitant with mild expressive aphasia with trouble naming and paraphasic errors. Comprehension mostly accurate, can follow 1&2 step commands well but difficulty with 3 step commands.  CRANIAL NERVE: decreased ability to count fingers suspect right quadrantonopsia. extraocular muscles intact, no nystagmus, R lower facial weakness from yesterday resolved. facial sensation symmetric, no tongue deviation. MOTOR: normal bulk and tone, full strength on the Left. Proximal strength on R normal. Decreased FMM on the R with left arm orbiting right.  SENSORY: normal and symmetric to light touch, temperature. Sensation intact; denies finger numbness COORDINATION: finger-nose-finger, fine finger movements, heel-shin normal GAIT/STATION: deferred.   ASSESSMENT/PLAN Debra [name] is a 42 y.o. female with  history of HTN, type 2 DM, obesity, endometriosis/ovarian cyst s/p hysterectomy/SBO and seasonal allergies presenting initially to [facility] with slurred speech and expressive aphasia. She did not receive IV t-PA due to NIH 0.   Stroke:   Left posterior temporal lobe infarcts L MCA territory in setting of uncontrolled risk factors and severe bilateral M2 intracranial stenosis, infarcts felt to be secondary to large vessel disease source  Resultant  Mild expressive and receptive aphasia, mild R hand clumsiness  Symptoms have remained stable over night   CT head Small 14 mm cortical focus of hypoattenuation within left parieto-occipital junction possible acute infarct, ASPECTS 10  CTA H&N on admission Multiple anterior and posterior stenosis, no ELVO  Repeat CT head w/ worsening stable left medial occipital lobe infarct, no new abnormalities.   MRI head cluster of acute infarct posterior left temporal lobe and temporoparietal junction region in the L MCA territory  Repeat CTA head, neck unchanged bilateram high grade M2 stenosis.   CTP with small perfusion deficit, not amenable to intervention  Repeat MRI - Patchy acute infarct in the left posterior MCA and watershed distributions  2D Echo - No cardiac source of emboli was indentified. EF 60-65%. Cerebral angiogram 07/04/2017 - 1.75%  + stenosis of prox dominant LT MCA inf division 2.Approx 80% stenosis of RT MCA distal M1 seg       LDL 8.5  HgbA1c 12.9  SCDs for VTE prophylaxis  Diet Carb Modified Fluid consistency: Thin;   No antithrombotic prior to admission, now on aspirin 325 mg daily and clopidogrel 75 mg daily following plavix load yesterday. Given large vessel disease, anticipate plavix and aspirin x 3 months at time of discharge  Added albumin 5 x 4 doses yesterday for volume  Ongoing aggressive stroke risk factor management  At this time, do not suspect RCVS or cerebral vasculitis (received empiric pulse dose steroids) -  f/u labs (ESR 13, RA latex turbid 10.5, , ANCA titers neg, RF 10.5, , extractable nuclear antigen ab neg, CRP 1.0, CL ab w/ IGM 15 (indeterminate),  homo neg, B2GP neg, Lupus AC neg, Prot C&S neg, anti III  85  anti-jo a ab - normal  ANCA titers - normal  Factor 5 leiden - negative  PT gene mutation - negative  Therapy recommendations:  Pending - Ok to get OOB  Disposition:  Anticipate return home w/ family (pt works as a Company secretary), await therapy evals  Hypertension  Elevated but Stable, 170s  Permissive hypertension (OK if < 220/120) but gradually normalize in 5-7 days  Ok to continue home antihypertensives  Long-term BP goal - may need slightly elevated BP due to advanced atherosclerosis.  Hyperlipidemia  Home meds:  None  Now on Lipitor 80  LDL 129, goal < 70  Continue statin at discharge  Diabetes  HgbA1c 8.5, goal < 7.0  Uncontrolled  CBGs elevated  Other Stroke Risk Factors  Morbid Obesity, Body mass index is 49.72 kg/m., recommend weight loss, diet and exercise as appropriate. Pt s/p lap band surgery 5 years ago   UDS level neg  Home norethindrone (oral contraceptive) d/c'd on admission  Family hx stroke (father at age 67, L brain stroke with R HP and severe expressive aphasia)  No hx Migraines  Consider OP eval for obstructive sleep apnea given morbid obesity  Hospital day # 5   Personally examined patient and images, and have participated in and made any corrections needed to history, physical, neuro exam,assessment and plan as stated above.  I have personally obtained the history, evaluated lab date, reviewed imaging studies and agree with radiology interpretations.      42 year old female with history hypertension, diabetes, obesity admitted for episode of aphasia, slow thought processing and right hand numbness as well as right temporal headache.  Symptom resolved on admission.  BP elevated.  CT  Showed left occipital stroke.  CTA of the  neck showed bilateral M2 stenosis, questionable for our CVS or intracranial stenosis.  MRI showed left inferior MCA patchy small infarcts.  LDL 129 and A1c 8.5.  UDS negative.  Currently on aspirin 325 and Lipitor 80.   However, symptoms fluctuated with expressive aphasia and slurred speech on sitting up yesterday. repeat CTA head and neck continue to show b/l M1 and M2 stenosis, as well as b/l PCA stenosis. MRI repeat showed extension of infarct at left MCA territory.   Pt condition still more likely advanced atherosclerosis intracranially given risk factors including morbid obesity, HTN, HLD, and DM.   Not able to do continue strict blood pressure control with systolic in the 094-709 range.  Elective left MCA angioplasty tomorrow.  Explained the procedure to patient and family and answered questions.  LP due to plavix use. Continue DAPT and high dose lipitor This patient is critically ill and at significant risk of neurological worsening, death and care requires constant monitoring of vital signs, hemodynamics,respiratory and cardiac monitoring, extensive review of multiple databases, frequent neurological assessment, discussion with family, other specialists and medical decision making of high complexity.I have made any additions or clarifications directly to the above note.This critical care time does not reflect procedure time, or teaching time or supervisory time of PA/NP/Med Resident etc but could involve care discussion time.  I spent 30 minutes of neurocritical care time  in the care of  this patient.     Antony Contras, MD Medical Director [facility] Stroke [facility] Pager: [phone] 07/06/2017 11:38 AM   To contact Stroke Continuity provider, please refer to [email or web address] After hours, contact General Neurology

## 2017-07-07 ENCOUNTER — Inpatient Hospital Stay (HOSPITAL_COMMUNITY): Payer: BC Managed Care – PPO

## 2017-07-07 ENCOUNTER — Inpatient Hospital Stay (HOSPITAL_COMMUNITY): Payer: BC Managed Care – PPO | Admitting: Certified Registered"

## 2017-07-07 ENCOUNTER — Encounter (HOSPITAL_COMMUNITY)
Admission: EM | Disposition: A | Discharge status: Home / Self Care | Payer: Self-pay | Source: Home / Self Care | Attending: Neurology

## 2017-07-07 ENCOUNTER — Encounter (HOSPITAL_COMMUNITY): Payer: Self-pay

## 2017-07-07 DIAGNOSIS — I6603 Occlusion and stenosis of bilateral middle cerebral arteries: Secondary | ICD-10-CM | POA: Diagnosis present

## 2017-07-07 HISTORY — PX: RADIOLOGY WITH ANESTHESIA: SHX6223

## 2017-07-07 HISTORY — PX: IR INTRA CRAN STENT: IMG2345

## 2017-07-07 LAB — GLUCOSE, CAPILLARY
GLUCOSE-CAPILLARY: 165 mg/dL — AB (ref 65–99)
Glucose-Capillary: 174 mg/dL — ABNORMAL HIGH (ref 65–99)
Glucose-Capillary: 126 mg/dL — ABNORMAL HIGH (ref 65–99)
Glucose-Capillary: 193 mg/dL — ABNORMAL HIGH (ref 65–99)

## 2017-07-07 LAB — CBC WITH DIFFERENTIAL/PLATELET
Basophils Absolute: 0 10*3/uL (ref 0.0–0.1)
Basophils Relative: 0 %
Eosinophils Absolute: 0.3 10*3/uL (ref 0.0–0.7)
Eosinophils Relative: 3 %
HEMATOCRIT: 37.2 % (ref 36.0–46.0)
HEMOGLOBIN: 11.8 g/dL — AB (ref 12.0–15.0)
LYMPHS ABS: 2.4 10*3/uL (ref 0.7–4.0)
Lymphocytes Relative: 26 %
MCH: 26.6 pg (ref 26.0–34.0)
MCHC: 31.7 g/dL (ref 30.0–36.0)
MCV: 84 fL (ref 78.0–100.0)
MONOS PCT: 5 %
Monocytes Absolute: 0.5 10*3/uL (ref 0.1–1.0)
NEUTROS ABS: 6.1 10*3/uL (ref 1.7–7.7)
NEUTROS PCT: 66 %
Platelets: 239 10*3/uL (ref 150–400)
RBC: 4.43 MIL/uL (ref 3.87–5.11)
RDW: 12.9 % (ref 11.5–15.5)
WBC: 9.2 10*3/uL (ref 4.0–10.5)

## 2017-07-07 LAB — TYPE AND SCREEN
ABO/RH(D): O POS
Antibody Screen: NEGATIVE

## 2017-07-07 LAB — PREPARE PLATELET PHERESIS
UNIT DIVISION: 0
Unit division: 0

## 2017-07-07 LAB — SURGICAL PCR SCREEN
MRSA, PCR: NEGATIVE
STAPHYLOCOCCUS AUREUS: NEGATIVE

## 2017-07-07 LAB — PLATELET INHIBITION P2Y12: PLATELET FUNCTION P2Y12: 197 [PRU] (ref 194–418)

## 2017-07-07 LAB — BPAM PLATELET PHERESIS
BLOOD PRODUCT EXPIRATION DATE: 201903182359
Blood Product Expiration Date: 201903182359
ISSUE DATE / TIME: 201903181426
ISSUE DATE / TIME: 201903181426
UNIT TYPE AND RH: 6200
Unit Type and Rh: 6200

## 2017-07-07 LAB — APTT: aPTT: 72 seconds — ABNORMAL HIGH (ref 24–36)

## 2017-07-07 LAB — PROTIME-INR
INR: 1.06
Prothrombin Time: 13.7 seconds (ref 11.4–15.2)

## 2017-07-07 LAB — POCT ACTIVATED CLOTTING TIME: Activated Clotting Time: 191 seconds

## 2017-07-07 LAB — ABO/RH: ABO/RH(D): O POS

## 2017-07-07 SURGERY — IR WITH ANESTHESIA
Anesthesia: General

## 2017-07-07 MED ORDER — IOPAMIDOL (ISOVUE-300) INJECTION 61%
INTRAVENOUS | Status: AC
  Administered 2017-07-07: 100 mL
  Filled 2017-07-07: qty 150

## 2017-07-07 MED ORDER — LIDOCAINE HCL (CARDIAC) 20 MG/ML IV SOLN
INTRAVENOUS | Status: DC | PRN
  Administered 2017-07-07: 100 mg via INTRAVENOUS

## 2017-07-07 MED ORDER — NITROGLYCERIN 1 MG/10 ML FOR IR/CATH LAB
INTRA_ARTERIAL | Status: AC
  Filled 2017-07-07: qty 10

## 2017-07-07 MED ORDER — EPTIFIBATIDE BOLUS VIA INFUSION
INTRAVENOUS | Status: AC | PRN
  Administered 2017-07-07 (×5): 1.5 mg via INTRAVENOUS

## 2017-07-07 MED ORDER — TICAGRELOR 90 MG PO TABS
90.0000 mg | ORAL_TABLET | Freq: Two times a day (BID) | ORAL | Status: DC
  Administered 2017-07-07 – 2017-07-09 (×4): 90 mg via ORAL
  Filled 2017-07-07 (×5): qty 1

## 2017-07-07 MED ORDER — TICAGRELOR 90 MG PO TABS
90.0000 mg | ORAL_TABLET | Freq: Two times a day (BID) | ORAL | Status: DC
  Filled 2017-07-07: qty 1

## 2017-07-07 MED ORDER — LABETALOL HCL 5 MG/ML IV SOLN
10.0000 mg | INTRAVENOUS | Status: DC | PRN
  Administered 2017-07-08: 10 mg via INTRAVENOUS
  Filled 2017-07-07: qty 4

## 2017-07-07 MED ORDER — ONDANSETRON HCL 4 MG/2ML IJ SOLN
INTRAMUSCULAR | Status: DC | PRN
  Administered 2017-07-07: 4 mg via INTRAVENOUS

## 2017-07-07 MED ORDER — HEPARIN (PORCINE) IN NACL 100-0.45 UNIT/ML-% IJ SOLN
INTRAMUSCULAR | Status: AC
  Administered 2017-07-07: 500 [IU]/h via INTRAVENOUS
  Filled 2017-07-07: qty 250

## 2017-07-07 MED ORDER — SUGAMMADEX SODIUM 500 MG/5ML IV SOLN
INTRAVENOUS | Status: DC | PRN
  Administered 2017-07-07: 300 mg via INTRAVENOUS

## 2017-07-07 MED ORDER — HEPARIN (PORCINE) IN NACL 100-0.45 UNIT/ML-% IJ SOLN
900.0000 [IU]/h | INTRAMUSCULAR | Status: DC
  Filled 2017-07-07: qty 250

## 2017-07-07 MED ORDER — GLYCOPYRROLATE 0.2 MG/ML IJ SOLN
INTRAMUSCULAR | Status: DC | PRN
  Administered 2017-07-07: 0.4 mg via INTRAVENOUS

## 2017-07-07 MED ORDER — EPTIFIBATIDE 20 MG/10ML IV SOLN
INTRAVENOUS | Status: AC
  Filled 2017-07-07: qty 10

## 2017-07-07 MED ORDER — TICAGRELOR 90 MG PO TABS: 90.0000 mg | ORAL_TABLET | Freq: Two times a day (BID) | ORAL | Status: DC

## 2017-07-07 MED ORDER — NIMODIPINE 30 MG PO CAPS
0.0000 mg | ORAL_CAPSULE | ORAL | Status: AC
  Administered 2017-07-07: 60 mg via ORAL
  Filled 2017-07-07: qty 2

## 2017-07-07 MED ORDER — HEPARIN (PORCINE) IN NACL 100-0.45 UNIT/ML-% IJ SOLN
500.0000 [IU]/h | INTRAMUSCULAR | Status: DC
  Administered 2017-07-07: 500 [IU]/h via INTRAVENOUS

## 2017-07-07 MED ORDER — DEXTROSE 5 % IV SOLN
INTRAVENOUS | Status: DC | PRN
  Administered 2017-07-07: 50 ug/min via INTRAVENOUS

## 2017-07-07 MED ORDER — ASPIRIN 81 MG PO CHEW
81.0000 mg | CHEWABLE_TABLET | Freq: Every day | ORAL | Status: DC
  Administered 2017-07-08: 81 mg via ORAL
  Filled 2017-07-07: qty 1

## 2017-07-07 MED ORDER — PROPOFOL 10 MG/ML IV BOLUS
INTRAVENOUS | Status: DC | PRN
  Administered 2017-07-07: 60 mg via INTRAVENOUS
  Administered 2017-07-07: 170 mg via INTRAVENOUS

## 2017-07-07 MED ORDER — NICARDIPINE HCL IN NACL 20-0.86 MG/200ML-% IV SOLN
0.0000 mg/h | INTRAVENOUS | Status: DC
  Administered 2017-07-07: 15 mg/h via INTRAVENOUS
  Administered 2017-07-07: 5 mg/h via INTRAVENOUS
  Administered 2017-07-07: 12.5 mg/h via INTRAVENOUS
  Administered 2017-07-07: 8 mg/h via INTRAVENOUS
  Administered 2017-07-07: 10 mg/h via INTRAVENOUS
  Administered 2017-07-08: 5 mg/h via INTRAVENOUS
  Administered 2017-07-08 (×4): 12.5 mg/h via INTRAVENOUS
  Administered 2017-07-08: 15 mg/h via INTRAVENOUS
  Administered 2017-07-08 (×2): 12.5 mg/h via INTRAVENOUS
  Filled 2017-07-07 (×11): qty 200
  Filled 2017-07-07: qty 400

## 2017-07-07 MED ORDER — ROCURONIUM BROMIDE 100 MG/10ML IV SOLN
INTRAVENOUS | Status: DC | PRN
  Administered 2017-07-07: 30 mg via INTRAVENOUS
  Administered 2017-07-07: 70 mg via INTRAVENOUS

## 2017-07-07 MED ORDER — MIDAZOLAM HCL 5 MG/5ML IJ SOLN
INTRAMUSCULAR | Status: DC | PRN
  Administered 2017-07-07: 0.5 mg via INTRAVENOUS

## 2017-07-07 MED ORDER — SODIUM CHLORIDE 0.9 % IV SOLN: INTRAVENOUS | Status: DC

## 2017-07-07 MED ORDER — ASPIRIN 81 MG PO CHEW: 81.0000 mg | CHEWABLE_TABLET | Freq: Every day | ORAL | Status: DC

## 2017-07-07 MED ORDER — IOPAMIDOL (ISOVUE-300) INJECTION 61%
INTRAVENOUS | Status: AC
Start: 2017-07-07 — End: 2017-07-07
  Administered 2017-07-07: 38 mL
  Filled 2017-07-07: qty 150

## 2017-07-07 MED ORDER — ASPIRIN EC 325 MG PO TBEC: 325.0000 mg | DELAYED_RELEASE_TABLET | ORAL | Status: DC

## 2017-07-07 MED ORDER — SODIUM CHLORIDE 0.9 % IJ SOLN
INTRAVENOUS | Status: AC | PRN
  Administered 2017-07-07 (×7): 25 ug via INTRA_ARTERIAL

## 2017-07-07 MED ORDER — HEPARIN (PORCINE) IN NACL 100-0.45 UNIT/ML-% IJ SOLN
900.0000 [IU]/h | INTRAMUSCULAR | Status: DC
  Administered 2017-07-07: 900 [IU]/h via INTRAVENOUS
  Filled 2017-07-07: qty 250

## 2017-07-07 MED ORDER — MUPIROCIN 2 % EX OINT
1.0000 "application " | TOPICAL_OINTMENT | Freq: Two times a day (BID) | CUTANEOUS | Status: DC
  Administered 2017-07-07 – 2017-07-08 (×3): 1 via NASAL
  Filled 2017-07-07: qty 22

## 2017-07-07 MED ORDER — LACTATED RINGERS IV SOLN
INTRAVENOUS | Status: DC | PRN
  Administered 2017-07-07: 13:00:00 via INTRAVENOUS

## 2017-07-07 MED ORDER — TICAGRELOR 90 MG PO TABS
180.0000 mg | ORAL_TABLET | Freq: Once | ORAL | Status: AC
  Administered 2017-07-07: 180 mg via ORAL
  Filled 2017-07-07: qty 2

## 2017-07-07 MED ORDER — VANCOMYCIN HCL 10 G IV SOLR
1500.0000 mg | INTRAVENOUS | Status: AC
  Administered 2017-07-07: 1500 mg via INTRAVENOUS
  Filled 2017-07-07: qty 1500

## 2017-07-07 NOTE — Sedation Documentation (Signed)
Report given to PACU RN.

## 2017-07-07 NOTE — Progress Notes (Signed)
Patient ID: Debra [name], female   DOB: 08/27/1975, [age] y.o.   MRN: [id number] INR.  Patient extubated without difficulty. Moves LLT arm and leg and Rt leg spontaneouslly and to command. Has difficulty in verbalizing and intermittentl understanding. Pupils 2mm eqaula sluggish. Mild RT facial droop unchanged from prior to the procedure. Tomgue midline. Rt groin soft. No palpable hematoma. Distal pulses present bilaterally. Will obtain CT brain. D/W Dr  Pearlean BrownieSethi . S.Jejuan Scala MD

## 2017-07-07 NOTE — Anesthesia Preprocedure Evaluation (Addendum)
Anesthesia Evaluation  Patient identified by MRN, date of birth, ID band Patient awake    Reviewed: Allergy & Precautions, NPO status , Patient's Chart, lab work & pertinent test results  History of Anesthesia Complications (+) Emergence Delirium  Airway Mallampati: II  TM Distance: >3 FB Neck ROM: Full    Dental no notable dental hx.    Pulmonary asthma ,    Pulmonary exam normal breath sounds clear to auscultation       Cardiovascular hypertension, + Peripheral Vascular Disease  Normal cardiovascular exam Rhythm:Regular Rate:Normal     Neuro/Psych CVA, Residual Symptoms    GI/Hepatic negative GI ROS,   Endo/Other  diabetes, Type 2  Renal/GU negative Renal ROS     Musculoskeletal   Abdominal (+) + obese,   Peds  Hematology   Anesthesia Other Findings   Reproductive/Obstetrics                           Lab Results  Component Value Date   WBC 9.2 07/07/2017   HGB 11.8 (L) 07/07/2017   HCT 37.2 07/07/2017   MCV 84.0 07/07/2017   PLT 239 07/07/2017   Lab Results  Component Value Date   CREATININE 0.78 07/06/2017   BUN 7 07/06/2017   NA 135 07/06/2017   K 3.7 07/06/2017   CL 108 07/06/2017   CO2 20 (L) 07/06/2017    Anesthesia Physical Anesthesia Plan  ASA: IV  Anesthesia Plan: General   Post-op Pain Management:    Induction: Intravenous  PONV Risk Score and Plan: Treatment may vary due to age or medical condition  Airway Management Planned: Oral ETT  Additional Equipment: Arterial line  Intra-op Plan:   Post-operative Plan:   Informed Consent: I have reviewed the patients History and Physical, chart, labs and discussed the procedure including the risks, benefits and alternatives for the proposed anesthesia with the patient or authorized representative who has indicated his/her understanding and acceptance.   Dental advisory given  Plan Discussed with:  CRNA  Anesthesia Plan Comments:         Anesthesia Quick Evaluation

## 2017-07-07 NOTE — Procedures (Signed)
S/P lt common carotid arteriogram followed by stenting have tandem high grade stenosis of large inf division of Lrt MCA With significantly improved caliber and flow

## 2017-07-07 NOTE — Transfer of Care (Signed)
Immediate Anesthesia Transfer of Care Note  Patient: Debra Droughtiffany L [name]  Procedure(s) Performed: STENTING (N/A )  Patient Location: PACU  Anesthesia Type:General  Level of Consciousness: awake and patient cooperative  Airway & Oxygen Therapy: Patient Spontanous Breathing and Patient connected to face mask oxygen  Post-op Assessment: Report given to RN and Post -op Vital signs reviewed and stable  Post vital signs: Reviewed and stable  Last Vitals:  Vitals:   07/07/17 1000 07/07/17 1030  BP:  (!) 168/127  Pulse: 82   Resp: (!) 7   Temp:    SpO2: 100%     Last Pain:  Vitals:   07/07/17 0800  TempSrc: Oral  PainSc:          Complications: No apparent anesthesia complications

## 2017-07-07 NOTE — Progress Notes (Signed)
Physical Therapy Treatment Patient Details Name: Debra [name] MRN: [id number] DOB: 06/12/1975 Today's Date: 07/07/2017    History of Present Illness Debra [name] is a 42yo admitted with expressive aphasia and RUE tingling with L MCA CVA. Pt with hypotension after arteriogram 3/15 with extension of infarct. Planned stent 3/18. PMHx: HTN, DM2, rt knee arthritis    PT Comments    Pt very pleasant with continued difficulty with expressive aphasia but able to clearly communicate and provide thoughts with some word finding difficulty at times. Pt with good mobility and needs to perform stairs but deferred at this time and if able to maintain mobility and stairs after procedure will be appropriate for return home from a mobility stand point.   BP 168/127 art line   Follow Up Recommendations  No PT follow up     Equipment Recommendations  None recommended by PT    Recommendations for Other Services       Precautions / Restrictions Precautions Precautions: Other (comment) Precaution Comments: SBP >150    Mobility  Bed Mobility               General bed mobility comments: in chair on arrival  Transfers Overall transfer level: Modified independent                  Ambulation/Gait Ambulation/Gait assistance: Supervision Ambulation Distance (Feet): 600 Feet Assistive device: None Gait Pattern/deviations: Step-through pattern;Decreased stride length   Gait velocity interpretation: Below normal speed for age/gender General Gait Details: slow steady gait. pt reports feeling like she is wading through Catering managerwater   Stairs            Wheelchair Mobility    Modified Rankin (Stroke Patients Only) Modified Rankin (Stroke Patients Only) Pre-Morbid Rankin Score: No significant disability Modified Rankin: No significant disability     Balance Overall balance assessment: Mild deficits observed, not formally tested                                          Cognition Arousal/Alertness: Awake/alert Behavior During Therapy: WFL for tasks assessed/performed Overall Cognitive Status: Within Functional Limits for tasks assessed                                        Exercises      General Comments        Pertinent Vitals/Pain Pain Assessment: No/denies pain    Home Living                      Prior Function            PT Goals (current goals can now be found in the care plan section) Progress towards PT goals: Progressing toward goals    Frequency    Min 3X/week      PT Plan Current plan remains appropriate;Frequency needs to be updated    Co-evaluation              AM-PAC PT "6 Clicks" Daily Activity  Outcome Measure  Difficulty turning over in bed (including adjusting bedclothes, sheets and blankets)?: None Difficulty moving from lying on back to sitting on the side of the bed? : None Difficulty sitting down on and standing up from a chair with arms (e.g., wheelchair, bedside commode,  etc,.)?: None Help needed moving to and from a bed to chair (including a wheelchair)?: None Help needed walking in hospital room?: A Little Help needed climbing 3-5 steps with a railing? : A Little 6 Click Score: 22    End of Session   Activity Tolerance: Patient tolerated treatment well Patient left: in chair;with family/visitor present;with call bell/phone within reach Nurse Communication: Mobility status PT Visit Diagnosis: Other symptoms and signs involving the nervous system (Z61.096)     Time: 0454-0981 PT Time Calculation (min) (ACUTE ONLY): 18 min  Charges:  $Gait Training: 8-22 mins                    G Codes:       Delaney Meigs, PT [phone]    Illana Nolting B Leni Pankonin 07/07/2017, 10:32 AM

## 2017-07-07 NOTE — Anesthesia Procedure Notes (Signed)
Procedure Name: Intubation Date/Time: 07/07/2017 1:29 PM Performed by: Malachi Carlho, [place], CRNA Pre-anesthesia Checklist: Emergency Drugs available, Patient identified, Suction available and Patient being monitored Patient Re-evaluated:Patient Re-evaluated prior to induction Oxygen Delivery Method: Circle system utilized Preoxygenation: Pre-oxygenation with 100% oxygen Induction Type: IV induction Ventilation: Mask ventilation without difficulty and Mask ventilation throughout procedure Laryngoscope Size: Miller and 4 Grade View: Grade I Tube type: Oral Tube size: 7.0 mm Number of attempts: 1 Airway Equipment and Method: Patient positioned with wedge pillow Placement Confirmation: ETT inserted through vocal cords under direct vision,  positive ETCO2 and breath sounds checked- equal and bilateral Secured at: 20 cm Dental Injury: Teeth and Oropharynx as per pre-operative assessment

## 2017-07-07 NOTE — Progress Notes (Signed)
PULMONARY / CRITICAL CARE MEDICINE   Name: JAIDAH LOMAX MRN: [id number] DOB: [birth year]    ADMISSION DATE:  06/30/2017 CONSULTATION DATE:  3/11  REFERRING MD:  Clent Jacks   CHIEF COMPLAINT:  Hemodynamic management in acute CVA  HISTORY OF PRESENT ILLNESS:   This is a 42 year old female with a significant history of hypertension, diabetes type 2, And asthma.  Presented to the emergency room on 3/12 with a working diagnosis of PRES, manifesting as slurred speech, expressive aphasia and numbness on her right hand.  Subsequent imaging via MRI on 3/14 showed cluster of acute infarcts in the posterior left temporal lobe and temporoparietal junction felt to be most consistent with embolic infarct.  She had been admitted by the stroke team, and following imaging CT angiogram on 3/15 obtained demonstrated 75% plus stenosis of proximal dominant left MCA and 80% stenosis of the right MCA.  While holding pressure for sheath removal after arteriogram pt has acute vaso-vagal response w/ short change in MS that improved spontaneously. Based on these findings and concern for further risk of perfusion related neurovascular injury he was transferred to the intensive care.  He is planned for neurovascular procedure tentatively on 3/18 with hopes to stent the occluded arteries in the meantime pulmonary critical care has been asked to evaluate and manage blood pressure with goal systolic blood pressure 150 to 160   SUBJECTIVE:  Awake alert.  She reports her speech continues to improve. Off neo-synephrine. Awaiting L MCA stent later today  VITAL SIGNS: BP (!) 153/85   Pulse 88   Temp 99.1 F (37.3 C) (Oral)   Resp 20   Ht 5\' 8"  (1.727 m)   Wt (!) 327 lb (148.3 kg)   LMP 07/28/2011   SpO2 100%   BMI 49.72 kg/m   HEMODYNAMICS:    VENTILATOR SETTINGS:    INTAKE / OUTPUT: I/O last 3 completed shifts: In: 3963.2 [P.O.:1080; I.V.:2883.2] Out: 3377 [Urine:3375; Stool:2]  PHYSICAL EXAMINATION: General:  Obese 42 year old female currently sitting up in a chair and appropriately interactive.  She is in no distress  Neuro: Awake oriented speech clear with fewer pauses to find words.  Pupils equal, face symmetric, EOM's full, moving all 4's on request   Cardiovascular: S1 and S2 are regular without murmur rub or gallop Lungs: Clear to auscultation Abdomen: Obese, soft nontender Musculoskeletal: Equal strength and bulk Skin: Warm and dry strong pulses  LABS:  BMET Recent Labs  Lab 07/03/17 1002 07/05/17 0349 07/06/17 1201  NA 137 139 135  K 3.4* 3.4* 3.7  CL 106 108 108  CO2 20* 19* 20*  BUN <5* <5* 7  CREATININE 0.77 0.69 0.78  GLUCOSE 184* 205* 315*    Electrolytes Recent Labs  Lab 06/30/17 2325 07/03/17 1002 07/05/17 0349 07/06/17 1201  CALCIUM 8.7* 8.2* 8.2* 7.7*  MG 1.8 1.7  --   --     CBC Recent Labs  Lab 07/05/17 0349 07/06/17 1201 07/07/17 0443  WBC 13.5* 9.8 9.2  HGB 13.5 12.5 11.8*  HCT 42.1 39.1 37.2  PLT 340 284 239    Coag's Recent Labs  Lab 06/30/17 2325 07/07/17 0443  APTT 27  --   INR 1.06 1.06    Sepsis Markers No results for input(s): LATICACIDVEN, PROCALCITON, O2SATVEN in the last 168 hours.  ABG No results for input(s): PHART, PCO2ART, PO2ART in the last 168 hours.  Liver Enzymes Recent Labs  Lab 06/30/17 2325  AST 16  ALT 18  ALKPHOS 60  BILITOT 0.4  ALBUMIN 3.6    Cardiac Enzymes No results for input(s): TROPONINI, PROBNP in the last 168 hours.  Glucose Recent Labs  Lab 07/05/17 1153 07/05/17 1759 07/06/17 0829 07/06/17 1232 07/06/17 2139 07/07/17 0740  GLUCAP 213* 274* 154* 276* 262* 174*    Imaging No results found.    SIGNIFICANT EVENTS  3/11  Admit for TIA  3/15  To Neuro IR for 4 vessel arteriogram   STUDIES 3/11 CTA Head/Neck >> multiple segments of arterial stenosis in the anterior and posterior circulation which may represent cerebral vasculitis or possibly reversible cerebral  vasoconstriction syndrome (PRES).  No large vessel occlusion, widely patent carotid and vertebral arteries in the neck  CTA Head/Neck 3/13 >> persistent segments of high grade stenosis and right proximal M2 superior division and left proximal M2  CT Perfusion 3/13 >> no region of CBF to indicate new infarction.  Areas of infarction of the left posterior MCA distribution have pseudonormalized perfusion Region of T-max > 6 seconds (8 cc) in adjacent left parietal lobe indicating penumbra.  Larger region of T-max > 4 seconds (57 cc) extending into left posterior frontal lobe compatible with oligemia. MRI 3/14 >> patchy acute infarct in the left posterior MCA & watershed distributions, moderat to large total extent 3/15  Arteriogram > 75%+ stenosis of proximal dominant left MCA, 80% stenosis of R MCA distal M1 segment   CULTURES   ANTIBIOTICS    DISCUSSION: Acute CVA, awaiting neurovascular intervention with MCA stent placement anticipated for 3/18. Minimal residual today   ASSESSMENT / PLAN:  Acute left MCA/PCA stroke with symptoms exacerbated by transient hypotension and vasovagal response -Per IR angiogram showed 75% or more stenosis of proximal dominant left MCA and an 80% stenosis of the right MCA -Followed by stroke team and interventional radiology Plan Continue aspirin and Plavix Blood pressure goal greater than 150, using phenylephrine.   Other hypertensive control on hold Planning for L MCA stent procedure in IR today Serial neuro checks Continue supportive care  Transient vasovagal response, resolved Plan Follow telemetry   Borderline hyperglycemia Plan ssi and Lantus in place.  Penny PiaWJ Tallia Moehring, MD 07/07/2017, 9:00 AM [facility] [phone] or if no answer [phone]

## 2017-07-07 NOTE — Progress Notes (Addendum)
   07/07/17 1835  Vitals  Temp 98 F (36.7 C)  Temp Source Oral  BP (!) 170/75  MAP (mmHg) 98  Pulse Rate 84  ECG Heart Rate 84  Resp (!) 21  Oxygen Therapy  SpO2 100 %  Art Line  Arterial Line BP 159/71 (cardene titrated off)  Arterial Line MAP (mmHg) 97 mmHg  Pain Assessment  Pain Assessment No/denies pain  Pt arrived to 4N21 at 1835.  Pt A&O x 4, expressive aphasia, and slight RUE weakness. R groin site covered with angioseal and tegaderm.  Foley intact/cleaned, unclamped.  Bilateral pedal pulses 2+, Pt BP goal per Dr. Corliss Skainseveshwar is SBP 160-170.  Flat bedrest until 2200 with RLE straight until tomorrow morning (3/19). Pt without distress.  Family at the bedside.   TIME OF HEMOSTASIS UNKNOWN BY PACU NURSE, IR UNATTAINABLE FOR THIS INFO. Pt was handed off from IR to PACU at 1705, hemostasis assumed to be appx this time.

## 2017-07-07 NOTE — Plan of Care (Signed)
Patient is coping well with hospitalization and cooperating with NPO status before IR procedure.

## 2017-07-07 NOTE — Progress Notes (Signed)
  Speech Language Pathology Treatment: Cognitive-Linquistic  Patient Details Name: Debra [name] MRN: [id number] DOB: 06/30/1975 Today's Date: 07/07/2017 Time: [id number] SLP Time Calculation (min) (ACUTE ONLY): 24 min  Assessment / Plan / Recommendation Clinical Impression  Patient seen for aphasia treatment, alert and upright in chair. Patient able to verbalize complex ideas at the conversation level with independent use of compensatory strategies including pausing, slow rate, deliberate word choice, and use of gestures to overcome moderate word finding deficits impacting fluency.  Automatic, rhythmic verbal output with improved but not 100% accurate fluency. Auditory comprehension appearing intact during conversation but not re-assessed formally today. Recommend continued SLP f/u with consideration of diagnostic treatment for writing and/or use of electronics as patient relies heavily on this for communication at baseline as well.    HPI HPI: 42 y.o.femalewithhistory of hypertension and diabetes mellitus type 2 started to experience headache with difficulty speaking  evening of 3/11. MRI Brain WO contrast showed a cluster of acute infarctions in the posterior left temporal lobe and temporoparietal junction region consistent with embolic infarctions in the left MCA territorywhich the radiologist felt could be an extremely localized manifestation of PRES, but thought this was less likely. Pt with subsequent waxing/waning symptoms.       SLP Plan  Continue with current plan of care                      Oral Care Recommendations: Oral care BID SLP Visit Diagnosis: Aphasia (R47.01) Plan: Continue with current plan of care       [facility] Inceah Debra Gerdeman MA, CCC-SLP [phone]   Debra [name] Debra [name] 07/07/2017, 9:56 AM

## 2017-07-07 NOTE — Progress Notes (Signed)
Inpatient Diabetes Program Recommendations  AACE/ADA: New Consensus Statement on Inpatient Glycemic Control (2015)  Target Ranges:  Prepandial:   less than 140 mg/dL      Peak postprandial:   less than 180 mg/dL (1-2 hours)      Critically ill patients:  140 - 180 mg/dL   Results for Marygrace DroughtDAVIS, Kaycie L (MRN [id number]) as of 07/07/2017 09:19  Ref. Range 07/06/2017 08:29 07/06/2017 12:32 07/06/2017 21:39 07/07/2017 07:40  Glucose-Capillary Latest Ref Range: 65 - 99 mg/dL 409154 (H) 811276 (H) 914262 (H) 174 (H)   Review of Glycemic Control  Current orders for Inpatient glycemic control: Lantus 15 units QHS, Novolog 0-9 units TID with meals  Inpatient Diabetes Program Recommendations:  Correction (SSI): Please consider ordering Novolog 0-5 units QHS for bedtime correction. Insulin - Meal Coverage: When diet resumed, please consider ordering Novolog 2 units Q4H for meal coverage if patient eats at least 50% of meals.  Thanks, Orlando PennerMarie Eyva Califano, RN, MSN, CDE Diabetes Coordinator Inpatient Diabetes Program [phone] (Team Pager from 8am to 5pm)

## 2017-07-07 NOTE — Progress Notes (Signed)
STROKE TEAM PROGRESS NOTE   History Per Note Consult 07/01/2017 Debra [name] is a 42 y.o. female with HTN, T2DM, Endometriosis s/p hysterectomy/BSO, and seasonal allergies who presented after developing slurred speech, expressive aphasia, and slight numbness in her right 1st and 2nd fingers. She said her symptoms started around 10 pm on 06/30/16 while at church. She felt that she understood what others would say to her, but could not respond appropriately.   She knew what she wanted to say, but could not formulate the words verbally or spelling correctly via texting. She had associated right frontal headache and some increased weakness of her right leg compared to baseline. She says she presented to [facility] ED around 10:45 pm and her symptoms began to improve after she was given medications for HTN and headache. She feels near her baseline now. She denies any change in vision, chest pain, palpitations, dyspnea, or similar episodes in the past.   She does not take any antiplatelet medications or statins. She reports adherence to losartan for blood pressure. She reports a family history of stroke in her father.  In the ED an initial CT head showed hypoattenuation in the left parieto-occipital junction which may represent acute or subacute Infarction.   CTA head/neck was obtained which showed changes in the anterior and posterior circulation thought to represent cerebral vasculitis vs RCVS/PRES without large vessel occlusion, aneurysm, or AVM.   Subsequent MRI Brain WO contrast showed a cluster of acute infarctions in the posterior left temporal lobe and temporoparietal junction region consistent with embolic infarctions in the left MCA territory which the radiologist felt could be an extremely localized manifestation of PRES, but thought this was less likely.   LKW: 10 pm 06/30/17 tpa given?: no, NIH 0 Premorbid modified rankin scale: 0 ICH Score: 0, no ICH NIHSS: 0   SUBJECTIVE (INTERVAL  HISTORY) No family members present.  The patient is alert and in bed without complaints. Intracranial PTA / stent planned for today.  OBJECTIVE Vitals:   07/07/17 0800 07/07/17 0900 07/07/17 1000 07/07/17 1030  BP:    (!) 168/127  Pulse: 76 81 82   Resp: (!) 23 18 (!) 7   Temp: 99.1 F (37.3 C)     TempSrc: Oral     SpO2: 100% 100% 100%   Weight:      Height:        CBC:  Recent Labs  Lab 06/30/17 2325  07/06/17 1201 07/07/17 0443  WBC 9.3   < > 9.8 9.2  NEUTROABS 6.2  --   --  6.1  HGB 14.3   < > 12.5 11.8*  HCT 44.4   < > 39.1 37.2  MCV 84.4   < > 84.6 84.0  PLT 325   < > 284 239   < > = values in this interval not displayed.    Basic Metabolic Panel:  Recent Labs  Lab 06/30/17 2325  07/03/17 1002 07/05/17 0349 07/06/17 1201  NA 140   < > 137 139 135  K 3.5   < > 3.4* 3.4* 3.7  CL 107   < > 106 108 108  CO2 25  --  20* 19* 20*  GLUCOSE 206*   < > 184* 205* 315*  BUN 6   < > <5* <5* 7  CREATININE 0.91   < > 0.77 0.69 0.78  CALCIUM 8.7*  --  8.2* 8.2* 7.7*  MG 1.8  --  1.7  --   --    < > =  values in this interval not displayed.    Lipid Panel:     Component Value Date/Time   CHOL 168 07/01/2017 0800   TRIG 60 07/01/2017 0800   HDL 27 (L) 07/01/2017 0800   CHOLHDL 6.2 07/01/2017 0800   VLDL 12 07/01/2017 0800   LDLCALC 129 (H) 07/01/2017 0800   HgbA1c:  Lab Results  Component Value Date   HGBA1C 8.5 (H) 07/01/2017   Urine Drug Screen:     Component Value Date/Time   LABOPIA NONE DETECTED 07/02/2017 0254   COCAINSCRNUR NONE DETECTED 07/02/2017 0254   LABBENZ NONE DETECTED 07/02/2017 0254   AMPHETMU NONE DETECTED 07/02/2017 0254   THCU NONE DETECTED 07/02/2017 0254   LABBARB NONE DETECTED 07/02/2017 0254    Alcohol Level No results found for: ETH  IMAGING  Ct Head Code Stroke Wo Contrast 06/30/2017 2329  1. Small 14 mm cortical focus of hypoattenuation within left parieto-occipital junction which may represent acute or subacute infarction.  No hemorrhage or mass effect.  2. ASPECTS is 10   Ct Angio Head W Or Wo Contrast Ct Angio Neck W Or Wo Contrast 07/01/2017  1. Multiple segments of arterial stenosis in the anterior and posterior circulation which may represent cerebral vasculitis or possibly reversible cerebral vasoconstriction syndrome (RCVS)/PRES given posterior distribution cortical abnormality.  2. No large vessel occlusion, aneurysm, or vascular malformation.  3. Widely patent carotid and vertebral arteries in the neck.   Ct Head Wo Contrast 07/01/2017 Stable infarct in the medial left occipital lobe. No new area of decreased attenuation evident by noncontrast enhanced CT. Note the noncontrast enhanced CT from 1 day prior as well as the current examination demonstrates less extensive infarct than is demonstrated on MR obtained earlier in the day. No hemorrhage or mass evident.   Mr Brain Wo Contrast 07/01/2017 0736 Cluster of acute infarctions in the posterior left temporal lobe and temporoparietal junction region consistent with embolic infarctions in the left MCA territory. No other abnormal brain parenchymal finding. Whereas this could represent an extremely localized manifestation of posterior reversible encephalopathy, I think that is less likely.   Ct Angio Head W Or Wo Contrast 07/02/2017 1. Persistent segments of high-grade stenosis and right proximal M2 superior division and left proximal M2 inferior division as well as segments of mild stenosis and bilateral PCA.  2. No new large vessel occlusion or aneurysm of the intracranial circulation.   Ct Angio Neck W Or Wo Contrast 07/02/2017 Patent carotid and vertebral arteries. No dissection, aneurysm, or hemodynamically significant stenosis utilizing NASCET criteria.   Ct Cerebral Perfusion W Contrast 07/02/2017 1. No region of CBF (<30%) to indicate new acute infarction.  2. Areas of infarction of left posterior MCA distribution have pseudonormalized perfusion.   3. Region of T-max > 6 seconds (8 cc) in adjacent left parietal lobe indicating penumbra.  4. Larger region of T-max > 4 seconds (57 cc) extending into left posterior frontal lobe compatible with oligemia.   Mr Brain Wo Contrast 07/03/2017  IMPRESSION: Patchy acute infarct in the left posterior MCA and watershed distributions, moderate to large in total extent.  Transthoracic Echocardiogram  07/03/2017 Study Conclusions - Left ventricle: The cavity size was normal. Systolic function was   normal. The estimated ejection fraction was in the range of 60%   to 65%. Wall motion was normal; there were no regional wall   motion abnormalities. Impressions: - No cardiac source of emboli was indentified.    GENERAL EXAM:  Vitals:   07/07/17   0800 07/07/17 0900 07/07/17 1000 07/07/17 1030  BP:    (!) 168/127  Pulse: 76 81 82   Resp: (!) 23 18 (!) 7   Temp: 99.1 F (37.3 C)     TempSrc: Oral     SpO2: 100% 100% 100%   Weight:      Height:        Patient is in no distress.obese middle aged lady  Lying in bed with HOB at 30 degrees.  CARDIOVASCULAR: Regular rate and rhythm, no murmurs, no carotid bruits. Lungs clear.  NEUROLOGIC: MENTAL STATUS: awake, alert, speech hesitant with mild expressive aphasia with trouble naming and paraphasic errors. Comprehension mostly accurate, can follow 1&2 step commands well but difficulty with 3 step commands.  CRANIAL NERVE: decreased ability to count fingers suspect right quadrantonopsia. extraocular muscles intact, no nystagmus, R lower facial weakness from yesterday resolved. facial sensation symmetric, no tongue deviation. MOTOR: normal bulk and tone, full strength on the Left. Proximal strength on R normal. Decreased FMM on the R with left arm orbiting right.  SENSORY: normal and symmetric to light touch, temperature. Sensation intact; denies finger numbness COORDINATION: finger-nose-finger, fine finger movements, heel-shin normal GAIT/STATION:  deferred.   ASSESSMENT/PLAN [name] is a 42 y.o. female with history of HTN, type 2 DM, obesity, endometriosis/ovarian cyst s/p hysterectomy/SBO and seasonal allergies presenting initially to [facility] with slurred speech and expressive aphasia. She did not receive IV t-PA due to NIH 0.   Stroke:   Left posterior temporal lobe infarcts L MCA territory in setting of uncontrolled risk factors and severe bilateral M2 intracranial stenosis, infarcts felt to be secondary to large vessel disease source  Resultant  Mild expressive and receptive aphasia, mild R hand clumsiness  Symptoms have remained stable over night   CT head Small 14 mm cortical focus of hypoattenuation within left parieto-occipital junction possible acute infarct, ASPECTS 10  CTA H&N on admission Multiple anterior and posterior stenosis, no ELVO  Repeat CT head w/ worsening stable left medial occipital lobe infarct, no new abnormalities.   MRI head cluster of acute infarct posterior left temporal lobe and temporoparietal junction region in the L MCA territory  Repeat CTA head, neck unchanged bilateram high grade M2 stenosis.   CTP with small perfusion deficit, not amenable to intervention  Repeat MRI - Patchy acute infarct in the left posterior MCA and watershed distributions  2D Echo - No cardiac source of emboli was indentified. EF 60-65%. Cerebral angiogram 07/04/2017 - 1.75%  + stenosis of prox dominant LT MCA inf division 2.Approx 80% stenosis of RT MCA distal M1 seg       LDL 8.5  HgbA1c 12.9  SCDs for VTE prophylaxis  Diet Carb Modified Fluid consistency: Thin;   No antithrombotic prior to admission, now on aspirin 325 mg daily and clopidogrel 75 mg daily following plavix load yesterday. Given large vessel disease, anticipate plavix and aspirin x 3 months at time of discharge  Added albumin 5 x 4 doses yesterday for volume  Ongoing aggressive stroke risk factor management  At this time, do not  suspect RCVS or cerebral vasculitis (received empiric pulse dose steroids) - f/u labs (ESR 13, RA latex turbid 10.5, , ANCA titers neg, RF 10.5, , extractable nuclear antigen ab neg, CRP 1.0, CL ab w/ IGM 15 (indeterminate),  homo neg, B2GP neg, Lupus AC neg, Prot C&S neg, anti III  85  anti-jo a ab - normal  ANCA titers - normal  Factor 5   leiden - negative  PT gene mutation - negative   Therapy recommendations: No PT follow-up recommended  Disposition:  Anticipate return home w/ family (pt works as a Company secretary), await therapy evals  Hypertension  Elevated but Stable, 170s  Permissive hypertension (OK if < 220/120) but gradually normalize in 5-7 days  Ok to continue home antihypertensives  Long-term BP goal - may need slightly elevated BP due to advanced atherosclerosis.  Hyperlipidemia  Home meds:  None  Now on Lipitor 80  LDL 129, goal < 70  Continue statin at discharge  Diabetes  HgbA1c 8.5, goal < 7.0  Uncontrolled  CBGs elevated  Other Stroke Risk Factors  Morbid Obesity, Body mass index is 49.72 kg/m., recommend weight loss, diet and exercise as appropriate. Pt s/p lap band surgery 5 years ago   UDS level neg  Home norethindrone (oral contraceptive) d/c'd on admission  Family hx stroke (father at age 43, L brain stroke with R HP and severe expressive aphasia)  No hx Migraines  Consider OP eval for obstructive sleep apnea given morbid obesity  Poorly controlled glucose. Will request diabetic nurse consult.  Intracranial PTA / stent today.  Hospital day # [place] PA-C [facility] Pager [phone] 07/07/2017, 2:07 PM   Personally examined patient and images, and have participated in and made any corrections needed to history, physical, neuro exam,assessment and plan as stated above.  I have personally obtained the history, evaluated lab date, reviewed imaging studies and agree with radiology interpretations.       42 year old female with history hypertension, diabetes, obesity admitted for episode of aphasia, slow thought processing and right hand numbness as well as right temporal headache.  Symptom resolved on admission.  BP elevated.  CT  Showed left occipital stroke.  CTA of the neck showed bilateral M2 stenosis, questionable for our CVS or intracranial stenosis.  MRI showed left inferior MCA patchy small infarcts.  LDL 129 and A1c 8.5.  UDS negative.  Currently on aspirin 325 and Lipitor 80.   However, symptoms fluctuated with expressive aphasia and slurred speech on sitting up yesterday. repeat CTA head and neck continue to show b/l M1 and M2 stenosis, as well as b/l PCA stenosis. MRI repeat showed extension of infarct at left MCA territory.   Pt condition still more likely advanced atherosclerosis intracranially given risk factors including morbid obesity, HTN, HLD, and DM.   Not able to do continue strict blood pressure control with systolic in the 102-725 range.  Elective left MCA angioplasty tomorrow.  Explained the procedure to patient and family and answered questions.  LP due to plavix use. Continue DAPT and high dose lipitor This patient is critically ill and at significant risk of neurological worsening, death and care requires constant monitoring of vital signs, hemodynamics,respiratory and cardiac monitoring, extensive review of multiple databases, frequent neurological assessment, discussion with family, other specialists and medical decision making of high complexity.I have made any additions or clarifications directly to the above note.This critical care time does not reflect procedure time, or teaching time or supervisory time of PA/NP/Med Resident etc but could involve care discussion time.  I spent 40 minutes of neurocritical care time  in the care of  this patient. Antony Contras, MD Medical Director [facility] St[facility] Pager: [phone] 07/07/2017 4:59 PM      To contact Stroke  Continuity provider, please refer to [email or web address] After hours, contact General Neurology

## 2017-07-07 NOTE — Progress Notes (Signed)
Patient ID: Marygrace Droughtiffany L Fackler, female   DOB: 08/23/1975, 42 y.o.   MRN: [id number] INR. CT brain NO ICH. Small hypoattenuation Lt parietal subcortical region probable subacute ischemia. Will continue on IV heparin with a PTT od 50 to 60 secs. Maintain systolic BP in the 160 to 170 range. D/W patients mother and brother.. S.Kesley Gaffey MD

## 2017-07-07 NOTE — Progress Notes (Signed)
ANTICOAGULATION CONSULT NOTE - Initial Consult  Pharmacy Consult for Heparin Indication: Post Interventional Neuroradiology Procedure  Allergies  Allergen Reactions  . Influenza Vaccines   . Metformin And Related Diarrhea  . Morphine Hives  . Augmentin [Amoxicillin-Pot Clavulanate] Rash    To bilat post hands only    Patient Measurements: Height: 5\' 8"  (172.7 cm) Weight: (!) 327 lb (148.3 kg) IBW/kg (Calculated) : 63.9 Heparin Dosing Weight: 103 kg  Vital Signs: Temp: 99.1 F (37.3 C) (03/18 0800) Temp Source: Oral (03/18 0800) BP: 168/127 (03/18 1030) Pulse Rate: 82 (03/18 1000)  Labs: Recent Labs    07/05/17 0349 07/06/17 1201 07/07/17 0443  HGB 13.5 12.5 11.8*  HCT 42.1 39.1 37.2  PLT 340 284 239  LABPROT  --   --  13.7  INR  --   --  1.06  CREATININE 0.69 0.78  --     Estimated Creatinine Clearance: 139.9 mL/min (by C-G formula based on SCr of 0.78 mg/dL).   Medical History: Past Medical History:  Diagnosis Date  . Allergic rhinitis, cause unspecified   . Asthma   . Diabetes mellitus type II 08/2009 dx  . Endometriosis   . Hypertension   . Knee pain, right    DJD, post traumatic with repeat falls  . Morbid obesity (HCC)    s/p lab band 09/2010  . Ovarian cyst    right s/p resection June 2013   Assessment:   [age] yr old female s/p neuroradiology procedure and stent. IV heparin begun at 500 units/hr in PACU.  Heparin to stop at 7am on 3/19.   Sheath out, no bleeding reported.  Goal of Therapy:  Heparin level 0.1-0.25 units/ml Monitor platelets by anticoagulation protocol: Yes   Plan:   Increase heparin drip to 900 units/hr.  Heparin level ~6 hrs after rate increase.  Stop IV heparin at 7am on 3/19.  Had been Aspirin 325 mg and Plavix 75 mg daily prior to procedure.  Brilinta load given at 1220 and to continue Brilinta 90 mg BID.  Decrease Aspirin to 81 mg daily with start of Brilinta.  Dennie FettersEgan, Lakashia Collison [place], [place] Pager: [phone]  07/07/2017,5:35  PM

## 2017-07-07 NOTE — Progress Notes (Signed)
OT Cancellation Note  Patient Details Name: Debra [name] MRN: [id number] DOB: 12/11/1975   Cancelled Treatment:    Reason Eval/Treat Not Completed: Patient at procedure or test/ unavailable.  Will reattempt.  [place] [employer], OTR/L [phone]   Jeani HawkingConarpe, Islam Eichinger M 07/07/2017, 2:21 PM

## 2017-07-08 ENCOUNTER — Encounter (HOSPITAL_COMMUNITY): Payer: Self-pay | Admitting: Interventional Radiology

## 2017-07-08 DIAGNOSIS — G459 Transient cerebral ischemic attack, unspecified: Secondary | ICD-10-CM

## 2017-07-08 LAB — CBC WITH DIFFERENTIAL/PLATELET
BASOS ABS: 0 10*3/uL (ref 0.0–0.1)
Basophils Relative: 0 %
EOS ABS: 0.2 10*3/uL (ref 0.0–0.7)
Eosinophils Relative: 2 %
HCT: 34.6 % — ABNORMAL LOW (ref 36.0–46.0)
HEMOGLOBIN: 11 g/dL — AB (ref 12.0–15.0)
LYMPHS ABS: 2 10*3/uL (ref 0.7–4.0)
LYMPHS PCT: 21 %
MCH: 26.8 pg (ref 26.0–34.0)
MCHC: 31.8 g/dL (ref 30.0–36.0)
MCV: 84.4 fL (ref 78.0–100.0)
Monocytes Absolute: 0.3 10*3/uL (ref 0.1–1.0)
Monocytes Relative: 3 %
NEUTROS PCT: 74 %
Neutro Abs: 7.3 10*3/uL (ref 1.7–7.7)
Platelets: 247 10*3/uL (ref 150–400)
RBC: 4.1 MIL/uL (ref 3.87–5.11)
RDW: 13.3 % (ref 11.5–15.5)
WBC: 9.9 10*3/uL (ref 4.0–10.5)

## 2017-07-08 LAB — GLUCOSE, CAPILLARY
GLUCOSE-CAPILLARY: 189 mg/dL — AB (ref 65–99)
GLUCOSE-CAPILLARY: 233 mg/dL — AB (ref 65–99)
GLUCOSE-CAPILLARY: 257 mg/dL — AB (ref 65–99)
Glucose-Capillary: 302 mg/dL — ABNORMAL HIGH (ref 65–99)

## 2017-07-08 LAB — BASIC METABOLIC PANEL
Anion gap: 10 (ref 5–15)
BUN: <5 mg/dL — ABNORMAL LOW (ref 6–20)
CHLORIDE: 107 mmol/L (ref 101–111)
CO2: 20 mmol/L — ABNORMAL LOW (ref 22–32)
Calcium: 7.6 mg/dL — ABNORMAL LOW (ref 8.9–10.3)
Creatinine, Ser: 0.7 mg/dL (ref 0.44–1.00)
GFR calc non Af Amer: >60 mL/min (ref >60)
Glucose, Bld: 236 mg/dL — ABNORMAL HIGH (ref 65–99)
POTASSIUM: 3.2 mmol/L — AB (ref 3.5–5.1)
SODIUM: 137 mmol/L (ref 135–145)

## 2017-07-08 LAB — HEPARIN LEVEL (UNFRACTIONATED): Heparin Unfractionated: 0.13 IU/mL — ABNORMAL LOW (ref 0.30–0.70)

## 2017-07-08 MED ORDER — HYDROCHLOROTHIAZIDE 12.5 MG PO CAPS
12.5000 mg | ORAL_CAPSULE | Freq: Every day | ORAL | Status: DC
  Administered 2017-07-08: 12.5 mg via ORAL
  Filled 2017-07-08: qty 1

## 2017-07-08 MED ORDER — LABETALOL HCL 5 MG/ML IV SOLN: 10.0000 mg | INTRAVENOUS | Status: DC | PRN

## 2017-07-08 MED ORDER — LOSARTAN POTASSIUM 50 MG PO TABS
100.0000 mg | ORAL_TABLET | Freq: Every day | ORAL | Status: DC
  Administered 2017-07-08 – 2017-07-09 (×2): 100 mg via ORAL
  Filled 2017-07-08 (×2): qty 2

## 2017-07-08 MED ORDER — MAGNESIUM SULFATE 2 GM/50ML IV SOLN
2.0000 g | Freq: Once | INTRAVENOUS | Status: AC
  Administered 2017-07-08: 2 g via INTRAVENOUS
  Filled 2017-07-08: qty 50

## 2017-07-08 MED ORDER — AMLODIPINE BESYLATE 10 MG PO TABS
10.0000 mg | ORAL_TABLET | Freq: Every day | ORAL | Status: DC
  Administered 2017-07-08 – 2017-07-09 (×2): 10 mg via ORAL
  Filled 2017-07-08 (×2): qty 1

## 2017-07-08 MED ORDER — NICARDIPINE HCL IN NACL 40-0.83 MG/200ML-% IV SOLN
0.0000 mg/h | INTRAVENOUS | Status: DC
  Administered 2017-07-08: 15 mg/h via INTRAVENOUS

## 2017-07-08 MED ORDER — POTASSIUM CHLORIDE 20 MEQ/15ML (10%) PO SOLN
40.0000 meq | Freq: Two times a day (BID) | ORAL | Status: DC
  Administered 2017-07-08 – 2017-07-09 (×3): 40 meq via ORAL
  Filled 2017-07-08 (×3): qty 30

## 2017-07-08 MED ORDER — AMLODIPINE 1 MG/ML ORAL SUSPENSION: 10.0000 mg | Freq: Every day | ORAL | Status: DC

## 2017-07-08 NOTE — Progress Notes (Addendum)
PULMONARY / CRITICAL CARE MEDICINE   Name: Debra [name] MRN: [id number] DOB: 1975/08/20    ADMISSION DATE:  06/30/2017 CONSULTATION DATE:  3/11  REFERRING MD:  Clent Jacks   CHIEF COMPLAINT:  Hemodynamic management in acute CVA  HISTORY OF PRESENT ILLNESS:   This is a 42 year old female with a significant history of hypertension, diabetes type 2, And asthma.  Presented to the emergency room on 3/12 with a working diagnosis of PRES, manifesting as slurred speech, expressive aphasia and numbness on her right hand.  Subsequent imaging via MRI on 3/14 showed cluster of acute infarcts in the posterior left temporal lobe and temporoparietal junction felt to be most consistent with embolic infarct.  She had been admitted by the stroke team, and following imaging CT angiogram on 3/15 obtained demonstrated 75% plus stenosis of proximal dominant left MCA and 80% stenosis of the right MCA.  While holding pressure for sheath removal after arteriogram pt has acute vaso-vagal response w/ short change in MS that improved spontaneously. Based on these findings and concern for further risk of perfusion related neurovascular injury he was transferred to the intensive care.  He is planned for neurovascular procedure tentatively on 3/18 with hopes to stent the occluded arteries in the meantime pulmonary critical care has been asked to evaluate and manage blood pressure with goal systolic blood pressure 160-170 ramge   SUBJECTIVE:  Awake alert.  Speech continued to improve per patient and family.. Off neo-synephrine.  L MCA stent 3/18 ( Small hypoattenuation Lt parietal subcortical region probable subacute ischemia.)  IV heparin with a PTT od 50 to 60 secs per neuro.    VITAL SIGNS: BP (!) 137/56   Pulse 80   Temp 98.6 F (37 C) (Oral)   Resp (!) 9   Ht 5\' 8"  (1.727 m)   Wt (!) 327 lb (148.3 kg)   LMP 07/28/2011   SpO2 98%   BMI 49.72 kg/m   HEMODYNAMICS:    VENTILATOR SETTINGS:    INTAKE /  OUTPUT: I/O last 3 completed shifts: In: 5307.8 [I.V.:5307.8] Out: 8395 [Urine:8375; Blood:20]  PHYSICAL EXAMINATION: General: Obese 42 year old female currently sitting up in bed and appropriately interactive. NAD   Neuro: Awake oriented speech clear , rare word searching Pupils equal, face symmetric, EOM's full, moving all 4's on request. Right leg weaker than L  Cardiovascular: S1 and S2 are regular without murmur rub or gallop Lungs: Clear to auscultation, diminished per bases Abdomen: Obese, soft non tender, ND Musculoskeletal: Equal strength and bulk, R leg weakness  Skin: Warm and dry, intact, tattoos noted, strong pulses  LABS:  BMET Recent Labs  Lab 07/05/17 0349 07/06/17 1201 07/08/17 0607  NA 139 135 137  K 3.4* 3.7 3.2*  CL 108 108 107  CO2 19* 20* 20*  BUN <5* 7 <5*  CREATININE 0.69 0.78 0.70  GLUCOSE 205* 315* 236*    Electrolytes Recent Labs  Lab 07/03/17 1002 07/05/17 0349 07/06/17 1201 07/08/17 0607  CALCIUM 8.2* 8.2* 7.7* 7.6*  MG 1.7  --   --   --     CBC Recent Labs  Lab 07/06/17 1201 07/07/17 0443 07/08/17 0607  WBC 9.8 9.2 9.9  HGB 12.5 11.8* 11.0*  HCT 39.1 37.2 34.6*  PLT 284 239 247    Coag's Recent Labs  Lab 07/07/17 0443 07/07/17 1957  APTT  --  72*  INR 1.06  --     Sepsis Markers No results for input(s): LATICACIDVEN, PROCALCITON, O2SATVEN in the  last 168 hours.  ABG No results for input(s): PHART, PCO2ART, PO2ART in the last 168 hours.  Liver Enzymes No results for input(s): AST, ALT, ALKPHOS, BILITOT, ALBUMIN in the last 168 hours.  Cardiac Enzymes No results for input(s): TROPONINI, PROBNP in the last 168 hours.  Glucose Recent Labs  Lab 07/06/17 2139 07/07/17 0740 07/07/17 1107 07/07/17 1701 07/07/17 2237 07/08/17 0755  GLUCAP 262* 174* 165* 126* 193* 189*    Imaging Ct Head Wo Contrast  Result Date: 07/07/2017 CLINICAL DATA:  Stroke-like symptoms following left MCA stent placement. EXAM: CT  HEAD WITHOUT CONTRAST TECHNIQUE: Contiguous axial images were obtained from the base of the skull through the vertex without intravenous contrast. COMPARISON:  Cerebral angiogram 07/07/2017 Brain MRI 07/03/2017 Head CT 07/02/2017 FINDINGS: Brain: No mass lesion, intraparenchymal hemorrhage or extra-axial collection. No evidence of acute cortical infarct. Normal appearance of the brain parenchyma and extra axial spaces for age. Vascular: There is a stent within the left MCA. The stent extends most of the length of the M1 segment, terminating near the bifurcation. Skull: Normal visualized skull base, calvarium and extracranial soft tissues. Sinuses/Orbits: No sinus fluid levels or advanced mucosal thickening. No mastoid effusion. Normal orbits. IMPRESSION: Status post left MCA stent placement. No hemorrhage or other acute finding. Electronically Signed   By: Deatra RobinsonKevin  Herman M.D.   On: 07/07/2017 17:11      SIGNIFICANT EVENTS  3/11  Admit for TIA  3/15  To Neuro IR for 4 vessel arteriogram   STUDIES 3/11 CTA Head/Neck >> multiple segments of arterial stenosis in the anterior and posterior circulation which may represent cerebral vasculitis or possibly reversible cerebral vasoconstriction syndrome (PRES).  No large vessel occlusion, widely patent carotid and vertebral arteries in the neck  CTA Head/Neck 3/13 >> persistent segments of high grade stenosis and right proximal M2 superior division and left proximal M2  CT Perfusion 3/13 >> no region of CBF to indicate new infarction.  Areas of infarction of the left posterior MCA distribution have pseudonormalized perfusion Region of T-max > 6 seconds (8 cc) in adjacent left parietal lobe indicating penumbra.  Larger region of T-max > 4 seconds (57 cc) extending into left posterior frontal lobe compatible with oligemia. MRI 3/14 >> patchy acute infarct in the left posterior MCA & watershed distributions, moderat to large total extent 3/15  Arteriogram > 75%+  stenosis of proximal dominant left MCA, 80% stenosis of R MCA distal M1 segment  3/18>> CT Head WO Contrast>> Status post left MCA stent placement. No hemorrhage or other acute finding  CULTURES   ANTIBIOTICS    DISCUSSION: Acute CVA, L MCA Stent placed 3/18, Tolerated procedure well. Notation of Small hypoattenuation Lt parietal subcortical region probable subacute ischemia on post procedure CT head.)  IV heparin with a PTT od 50 to 60 secs per neuro.Minimal and improving residuals noted on neuro exam.Remains maxed on Cardene gtt with continued elevated  BP. Will need addition of oral agents with blood pressure control prior to discharge home.   ASSESSMENT / PLAN:  Acute left MCA/PCA stroke with symptoms exacerbated by transient hypotension and vasovagal response -Per IR angiogram showed 75% or more stenosis of proximal dominant left MCA and an 80% stenosis of the right MCA -Followed by stroke team and interventional radiology Plan Continue aspirin and Plavix Blood pressure goal greater than 150, currently on cardene gtt max rate  hypertensive control will need to be transitioned to po>> per neuro Tolerated  L MCA stent procedure in  IR 3/18 Continue Serial neuro checks Continue supportive care  Transient vasovagal response, resolved Plan Follow telemetry EKG prn  Hypokalemia Hypomag Replete electrolytes as needed Follow BMET, Mag in am  Anemia Trend CBC Monitor for bleeding   Borderline hyperglycemia A1C Elevated on admission Plan ssi and Lantus in place. Diabetic education prior to DC  Bevelyn Ngo. AGACNP-BC 07/08/2017, 8:44 AM [facility] Pager [phone]

## 2017-07-08 NOTE — Progress Notes (Signed)
Pt is still on Cardene drip with elevated blood pressures. Home Cozaar restarted this morning and Norvasc added this afternoon. Will need to delay discharge until BP is controlled with oral medications. Labs pending for AM. Discharge summary started and "shared".  Delton Seeavid Ngan Qualls PA-C [facility] Pager [phone] 07/08/2017, 3:35 PM

## 2017-07-08 NOTE — Discharge Summary (Signed)
Stroke Discharge Summary  Patient ID: Debra [name]   MRN: [id number]      DOB: [birth year]  Date of Admission: 06/30/2017 Date of Discharge: 07/09/2017  Attending Physician:  Garvin Fila, MD, Stroke MD Consultant(s):   Treatment Team:  Stroke, Md, MD pulmonary/intensive care Dr Halford Chessman ; Interventional Radiology Dr. Estanislado Pandy. Patient's PCP:  Hoyt Koch, MD  DISCHARGE DIAGNOSIS:  Principal Problem: Left  MCA branch infarct due to severe left MCA stenosis s/p MCA angioplasty Active Problems:   Diabetes mellitus type 2, uncontrolled (HCC)   Asthma   Hypertensive urgency   Hypertensive encephalopathy   Acute embolic stroke ([place])   Hyperlipidemia   Middle cerebral artery stenosis, left 80% stenosis right middle cerebral artery-asymptomatic   Past Medical History:  Diagnosis Date  . Allergic rhinitis, cause unspecified   . Asthma   . Diabetes mellitus type II 08/2009 dx  . Endometriosis   . Hypertension   . Knee pain, right    DJD, post traumatic with repeat falls  . Morbid obesity ([place])    s/p lab band 09/2010  . Ovarian cyst    right s/p resection June 2013   Past Surgical History:  Procedure Laterality Date  . ABDOMINAL HYSTERECTOMY  05/14/12   [place] [facility]  . IR ANGIO INTRA EXTRACRAN SEL COM CAROTID INNOMINATE BILAT MOD SED  07/04/2017  . IR ANGIO VERTEBRAL SEL VERTEBRAL BILAT MOD SED  07/04/2017  . IR INTRA CRAN STENT  07/07/2017  . LAPAROSCOPIC GASTRIC BANDING  10/02/2010   start weight 349#  . RADIOLOGY WITH ANESTHESIA N/A 07/07/2017   Procedure: STENTING;  Surgeon: Luanne Bras, MD;  Location: [facility];  Service: Radiology;  Laterality: N/A;  . SALPINGOOPHORECTOMY  10/08/11   right    Allergies as of 07/08/2017      Reactions   Influenza Vaccines    Metformin And Related Diarrhea   Morphine Hives   Augmentin [amoxicillin-pot Clavulanate] Rash   To bilat post hands only        Allergies as of 07/09/2017      Reactions   Influenza  Vaccines    Metformin And Related Diarrhea   Morphine Hives   Augmentin [amoxicillin-pot Clavulanate] Rash   To bilat post hands only      Allergies as of 07/09/2017      Reactions   Influenza Vaccines    Metformin And Related Diarrhea   Morphine Hives   Augmentin [amoxicillin-pot Clavulanate] Rash   To bilat post hands only      Medication List    STOP taking these medications   benzonatate 200 MG capsule Commonly known as:  TESSALON   doxycycline 100 MG tablet Commonly known as:  VIBRA-TABS   HYDROcodone-homatropine 5-1.5 MG/5ML syrup Commonly known as:  HYCODAN   norethindrone 5 MG tablet Commonly known as:  AYGESTIN   olopatadine 0.1 % ophthalmic solution Commonly known as:  PATANOL   sodium chloride 0.65 % Soln nasal spray Commonly known as:  OCEAN     TAKE these medications   albuterol (2.5 MG/3ML) 0.083% nebulizer solution Commonly known as:  PROVENTIL Take 2.5 mg by nebulization every 6 (six) hours as needed for wheezing or shortness of breath.   albuterol 108 (90 Base) MCG/ACT inhaler Commonly known as:  PROAIR HFA Inhale 2 puffs into the lungs every 4 (four) hours as needed for wheezing or shortness of breath.   amLODipine 10 MG tablet Commonly known as:  NORVASC Take  1 tablet (10 mg total) by mouth daily.   aspirin 325 MG tablet Take 1 tablet (325 mg total) by mouth daily.   atorvastatin 80 MG tablet Commonly known as:  LIPITOR Take 1 tablet (80 mg total) by mouth daily at 6 PM.   blood glucose meter kit and supplies Dispense based on patient and insurance preference. Use up to four times daily as directed. (FOR ICD-9 250.00, 250.01).   dextromethorphan-guaiFENesin 30-600 MG 12hr tablet Commonly known as:  MUCINEX DM Take 1 tablet by mouth 2 (two) times daily as needed for cough.   diphenhydrAMINE 25 mg capsule Commonly known as:  BENADRYL Take 1 capsule (25 mg total) by mouth every 6 (six) hours as needed. What changed:  reasons to take  this   empagliflozin 25 MG Tabs tablet Commonly known as:  JARDIANCE Take 25 mg by mouth daily.   fexofenadine 180 MG tablet Commonly known as:  ALLEGRA Take 180 mg by mouth daily as needed. For seasonal allergies   fluticasone 50 MCG/ACT nasal spray Commonly known as:  FLONASE Place 2 sprays into both nostrils daily.   Fluticasone-Salmeterol 250-50 MCG/DOSE Aepb Commonly known as:  ADVAIR DISKUS Inhale 1 puff into the lungs 2 (two) times daily.   glipiZIDE 5 MG tablet Commonly known as:  GLUCOTROL TAKE 1 TABLET(5 MG) BY MOUTH TWICE DAILY BEFORE A MEAL   glucose blood test strip Commonly known as:  ONE TOUCH ULTRA TEST Use as directed   insulin glargine 100 UNIT/ML injection Commonly known as:  LANTUS Inject 0.15 mLs (15 Units total) into the skin at bedtime.   losartan 100 MG tablet Commonly known as:  COZAAR TAKE 1 TABLET BY MOUTH DAILY   NASONEX 50 MCG/ACT nasal spray Generic drug:  mometasone Place 2 sprays into the nose daily as needed. For nasal spray   potassium chloride 20 MEQ/15ML (10%) Soln Take 30 mLs (40 mEq total) by mouth 2 (two) times daily.   ticagrelor 90 MG Tabs tablet Commonly known as:  BRILINTA Take 1 tablet (90 mg total) by mouth 2 (two) times daily.   TRULICITY 1.5 VV/7.4MO Sopn Generic drug:  Dulaglutide INJECT 1.5 MG INTO THE SKIN ONCE A WEEK              LABORATORY STUDIES CBC    Component Value Date/Time   WBC 9.3 07/09/2017 0606   RBC 4.16 07/09/2017 0606   HGB 11.1 (L) 07/09/2017 0606   HCT 34.8 (L) 07/09/2017 0606   PLT 267 07/09/2017 0606   MCV 83.7 07/09/2017 0606   MCH 26.7 07/09/2017 0606   MCHC 31.9 07/09/2017 0606   RDW 13.0 07/09/2017 0606   LYMPHSABS 2.0 07/08/2017 0607   MONOABS 0.3 07/08/2017 0607   EOSABS 0.2 07/08/2017 0607   BASOSABS 0.0 07/08/2017 0607   CMP    Component Value Date/Time   NA 137 07/09/2017 0606   K 3.5 07/09/2017 0606   CL 105 07/09/2017 0606   CO2 23 07/09/2017 0606    GLUCOSE 177 (H) 07/09/2017 0606   BUN <5 (L) 07/09/2017 0606   CREATININE 0.67 07/09/2017 0606   CALCIUM 8.2 (L) 07/09/2017 0606   PROT 7.1 06/30/2017 2325   ALBUMIN 3.6 06/30/2017 2325   AST 16 06/30/2017 2325   ALT 18 06/30/2017 2325   ALKPHOS 60 06/30/2017 2325   BILITOT 0.4 06/30/2017 2325   GFRNONAA >60 07/09/2017 0606   GFRAA >60 07/09/2017 0606   COAGS Lab Results  Component Value Date  INR 1.06 07/07/2017   INR 1.06 06/30/2017   Lipid Panel    Component Value Date/Time   CHOL 168 07/01/2017 0800   TRIG 60 07/01/2017 0800   HDL 27 (L) 07/01/2017 0800   CHOLHDL 6.2 07/01/2017 0800   VLDL 12 07/01/2017 0800   LDLCALC 129 (H) 07/01/2017 0800   HgbA1C  Lab Results  Component Value Date   HGBA1C 8.5 (H) 07/01/2017   Urinalysis    Component Value Date/Time   COLORURINE YELLOW 04/20/2016 0305   APPEARANCEUR CLEAR 04/20/2016 0305   LABSPEC 1.024 04/20/2016 0305   PHURINE 5.0 04/20/2016 0305   GLUCOSEU 150 (A) 04/20/2016 0305   GLUCOSEU NEGATIVE 04/05/2013 1003   HGBUR SMALL (A) 04/20/2016 0305   BILIRUBINUR NEGATIVE 04/20/2016 0305   KETONESUR 20 (A) 04/20/2016 0305   PROTEINUR NEGATIVE 04/20/2016 0305   UROBILINOGEN 0.2 02/10/2015 0751   NITRITE NEGATIVE 04/20/2016 0305   LEUKOCYTESUR MODERATE (A) 04/20/2016 0305   Urine Drug Screen     Component Value Date/Time   LABOPIA NONE DETECTED 07/02/2017 0254   COCAINSCRNUR NONE DETECTED 07/02/2017 0254   LABBENZ NONE DETECTED 07/02/2017 0254   AMPHETMU NONE DETECTED 07/02/2017 0254   THCU NONE DETECTED 07/02/2017 0254   LABBARB NONE DETECTED 07/02/2017 0254    Alcohol Level No results found for: ETH   SIGNIFICANT DIAGNOSTIC STUDIES  Cerebral Angiogram  07/04/2017 IMPRESSION: Severe tandem 75% plus stenosis of the dominant large inferior division of the left middle cerebral artery proximally. Approximately 80% stenosis of the right middle cerebral artery distal M1 segment. PLAN: At the end of the  diagnostic procedure whilst pressure was being held in the right groin puncture site for hemostasis, the patient started complaining of nausea and feeling hot. She was slightly diaphoretic. It was noted that her heart rate had decreased to the 60s, and blood pressure was about 224 systolic. During this time it was also noted that the patient had complete expressive aphasia and right-sided arm and leg weakness with a right facial droop. Immediately, the patient was given a liter of normal saline as a fluid bolus. The patient was also placed in Trendelenburg position. Her blood pressure gradually improved to the 150s and 170s over the 825O diastolic. There was a gradual return of neurological function starting with the right lower extremity, the right upper extremity and eventually improved speech expression. The facial droop also receded. A code stroke was called for evaluation. It was agreed that this neurological deficit was a transient ischemic episode related to hypoperfusion due to the high-grade stenosis of her left middle cerebral artery as shown on the recent diagnostic arteriogram. The patient was then transported to the neuro ICU to continue with IV hydration, close neurologic observations, and also with vasopressor support in the form of Neo-Synephrine should that be needed. This was explained to the patient's husband and also to the patient as she recovered.   Ct Head Code Stroke Wo Contrast 06/30/2017 2329  1. Small 14 mm cortical focus of hypoattenuation within left parieto-occipital junction which may represent acute or subacute infarction. No hemorrhage or mass effect.  2. ASPECTS is 10    Ct Angio Head W Or Wo Contrast Ct Angio Neck W Or Wo Contrast 07/01/2017  1. Multiple segments of arterial stenosis in the anterior and posterior circulation which may represent cerebral vasculitis or possibly reversible cerebral vasoconstriction syndrome (RCVS)/PRES given  posterior distribution cortical abnormality.  2. No large vessel occlusion, aneurysm, or vascular malformation.  3. Widely patent  carotid and vertebral arteries in the neck.    Ct Head Wo Contrast 07/01/2017 Stable infarct in the medial left occipital lobe. No new area of decreased attenuation evident by noncontrast enhanced CT. Note the noncontrast enhanced CT from 1 day prior as well as the current examination demonstrates less extensive infarct than is demonstrated on MR obtained earlier in the day. No hemorrhage or mass evident.    Mr Brain Wo Contrast 07/01/2017 0736 Cluster of acute infarctions in the posterior left temporal lobe and temporoparietal junction region consistent with embolic infarctions in the left MCA territory. No other abnormal brain parenchymal finding. Whereas this could represent an extremely localized manifestation of posterior reversible encephalopathy, I think that is less likely.    Ct Angio Head W Or Wo Contrast 07/02/2017 1. Persistent segments of high-grade stenosis and right proximal M2 superior division and left proximal M2 inferior division as well as segments of mild stenosis and bilateral PCA.  2. No new large vessel occlusion or aneurysm of the intracranial circulation.    Ct Angio Neck W Or Wo Contrast 07/02/2017 Patent carotid and vertebral arteries. No dissection, aneurysm, or hemodynamically significant stenosis utilizing NASCET criteria.    Ct Cerebral Perfusion W Contrast 07/02/2017 1. No region of CBF (<30%) to indicate new acute infarction.  2. Areas of infarction of left posterior MCA distribution have pseudonormalized perfusion.  3. Region of T-max > 6 seconds (8 cc) in adjacent left parietal lobe indicating penumbra.  4. Larger region of T-max > 4 seconds (57 cc) extending into left posterior frontal lobe compatible with oligemia.    Mr Brain Wo Contrast 07/03/2017  IMPRESSION: Patchy acute infarct in the left posterior MCA and  watershed distributions, moderate to large in total extent.   Transthoracic Echocardiogram  07/03/2017 Study Conclusions - Left ventricle: The cavity size was normal. Systolic function was normal. The estimated ejection fraction was in the range of 60% to 65%.  Wall motion was normal; there were no regional wall motion abnormalities. Impressions: - No cardiac source of emboli was indentified.   Cerebral Angiogram - Dr Estanislado Pandy 07/07/2017 S/P lt common carotid arteriogram followed by stenting of tandem high grade stenosis of large inf division of Lt MCA With significantly improved caliber and flow      HISTORY OF PRESENT ILLNESS  Debra [name] a 42 y.o.femalewith HTN, T2DM, Endometriosis s/p hysterectomy/BSO, and seasonal allergies who presented after developing slurred speech, expressive aphasia, and slight numbness in her right 1st and 2nd fingers. She said her symptoms started around 10 pm on 06/30/16 while at church. She felt that she understood what others would say to her, but could not respond appropriately.   She knew what she wanted to say, but could not formulate the words verbally or spell correctly via texting. She had associated right frontal headache and some increased weakness of her right leg compared to baseline. She says she presented to [facility] ED around 10:45 pm and her symptoms began to improve after she was given medications for HTN and headache. She feels near her baseline now. She denies any change in vision, chest pain, palpitations, dyspnea, or similar episodes in the past.   She does not take any antiplatelet medications or statins. She reports adherence to losartan for blood pressure. She reports a family history of stroke in her father. In the ED an initial CT head showed hypoattenuation in the left parieto-occipital junction which may represent acute or subacute infarction.   CTA head/neck was obtained which  showed changes in the anterior and posterior  circulation thought to represent cerebral vasculitis vs RCVS/PRES without large vessel occlusion, aneurysm, or AVM.   Subsequent MRI Brain WO contrast showed a cluster of acute infarctions in the posterior left temporal lobe and temporoparietal junction region consistent with embolic infarctions in the left MCA territorywhich the radiologist felt could be an extremely localized manifestation of PRES, but thought this was less likely.  LKW:10 pm 06/30/17 tpa given?: no,NIH 0 Premorbid modified rankin scale:0 ICH Score:0, no ICH NIHSS:0   HOSPITAL COURSE Debra [name] is a 42 y.o. female with history of HTN, type 2 DM, obesity, endometriosis/ovarian cyst s/p hysterectomy/SBO and seasonal allergies presenting initially to [facility] with slurred speech and expressive aphasia. She did not receive IV t-PA due to NIH 0.   Stroke:   Left posterior temporal lobe infarcts L MCA territory in setting of uncontrolled risk factors and severe bilateral M2 intracranial stenosis, infarcts felt to be secondary to large vessel disease source  Resultant  Mild expressive and receptive aphasia, mild R hand clumsiness  Symptoms have remained stable over night   CT head Small 14 mm cortical focus of hypoattenuation within left parieto-occipital junction possible acute infarct, ASPECTS 10  CTA H&N on admission Multiple anterior and posterior stenosis, no ELVO  Repeat CT head w/ worsening stable left medial occipital lobe infarct, no new abnormalities.   MRI head cluster of acute infarct posterior left temporal lobe and temporoparietal junction region in the L MCA territory  Repeat CTA head, neck unchanged bilateram high grade M2 stenosis.   CTP with small perfusion deficit, not amenable to intervention  Repeat MRI - Patchy acute infarct in the left posterior MCA and watershed distributions  2D Echo - No cardiac source of emboli was indentified. EF 60-65%.  Cerebral angiogram 07/04/2017 - 1.75% +  stenosis of prox dominant LT MCA inf division. Approx 80% stenosis of RT MCA distal M1 seg  LDL 8.5  HgbA1c 12.9  SCDs for VTE prophylaxis  Diet Carb Modified Fluid consistency: Thin;   No antithrombotic prior to admission, now on aspirin 325 mg daily and clopidogrel 75 mg daily following plavix load yesterday. Given large vessel disease, anticipate plavix and aspirin x 3 months at time of discharge  Added albumin 5 x 4 doses yesterday for volume  Ongoing aggressive stroke risk factor management  At this time, do not suspect RCVS or cerebral vasculitis (received empiric pulse dose steroids) - f/u labs (ESR 13, RA latex turbid 10.5, , ANCA titers neg, RF 10.5, , extractable nuclear antigen ab neg, CRP 1.0, CL ab w/ IGM 15 (indeterminate),  homo neg, B2GP neg, Lupus AC neg, Prot C&S neg, anti III  85  anti-jo a ab - normal  ANCA titers - normal  Factor 5 leiden - negative  PT gene mutation - negative   Therapy recommendations: No PT follow-up recommended  Disposition:  Anticipate return home w/ family (pt works as a Company secretary), await therapy evals  Hypertension  Elevated but Stable, 170s  Permissive hypertension (OK if < 220/120) but gradually normalize in 5-7 days  Ok to continue home antihypertensives  Long-term BP goal - may need slightly elevated BP due to advanced atherosclerosis.  Hyperlipidemia  Home meds:  None  Now on Lipitor 80  LDL 129, goal < 70  Continue statin at discharge  Diabetes  HgbA1c 8.5, goal < 7.0  Uncontrolled  CBGs elevated   Cerebral Angiogram - Dr Estanislado Pandy 07/07/2017 S/P lt common  carotid arteriogram. Stenting of tandem high grade stenosis of large inf division of Lt MCA With significantly improved caliber and flow   Other Stroke Risk Factors  Morbid Obesity, Body mass index is 49.72 kg/m., recommend weight loss, diet and exercise as appropriate. Pt s/p lap band surgery 5 years ago   UDS level neg  Home  norethindrone (oral contraceptive) d/c'd on admission  Family hx stroke (father at age 78, L brain stroke with R HP and severe expressive aphasia)  No hx Migraines  Consider OP eval for obstructive sleep apnea given morbid obesity   Other Issues  Poorly controlled glucose. Diabetic nurse consult requested.  Post procedure IV Heparin discontinued at 7AM 11/07/2017 per Dr. Estanislado Pandy  Consider OP eval for obstructive sleep apnea given morbid obesity  Hypokalemia - supplemented - recheck in AM  Hypertension - Cozaar PTA -> restarted. Norvasc added. Still on Cardene drip.      DISCHARGE EXAM Vitals:   07/09/17 0500 07/09/17 0600 07/09/17 0700 07/09/17 0800  BP: 129/77 (!) 149/75 139/75 (!) 152/65  Pulse: 80 86 78 76  Resp: 19 (!) 24 (!) 22 20  Temp:    98.5 F (36.9 C)  TempSrc:    Oral  SpO2: 100% 100% 98% 96%  Weight:      Height:       CARDIOVASCULAR: Regular rate and rhythm, no murmurs, no carotid bruits. Lungs clear.  NEUROLOGIC: MENTAL STATUS: awake, alert, speech hesitant with mild expressive aphasia with trouble naming and paraphasic errors. Comprehension mostly accurate, can follow 1&2 step commands well but difficulty with 3 step commands.  CRANIAL NERVE: decreased ability to count fingers suspect right quadrantonopsia. extraocular muscles intact, no nystagmus, R lower facial weakness from yesterday resolved. facial sensation symmetric, no tongue deviation. MOTOR: normal bulk and tone, full strength on the Left. Proximal strength on R normal. Decreased FMM on the R with left arm orbiting right.  SENSORY: normal and symmetric to light touch, temperature. Sensation intact; denies finger numbness COORDINATION: finger-nose-finger, fine finger movements, heel-shin normal GAIT/STATION: deferred.   Discharge Diet   Fall precautions Diet heart healthy/carb modified Room service appropriate? Yes; Fluid consistency: Thin Diet - low sodium heart healthy  liquids  DISCHARGE PLAN  Disposition:  Discharge to home.  aspirin 325 mg daily and Brilinta 90 mg twice daily for secondary stroke prevention.  Ongoing risk factor control by Primary Care Physician at time of discharge  Follow-up Hoyt Koch, MD in 2 weeks.  Follow-up with Dr. Antony Contras, Stroke Clinic in 6 weeks, office to schedule an appointment.  Follow-up with Dr. Estanislado Pandy in 2 weeks or as instructed.  Patient advised to stop birth control pill and not to drive  45 minutes were spent preparing discharge. Antony Contras, MD Medical Director [facility] Pager: [phone] 07/09/2017 9:28 AM

## 2017-07-08 NOTE — Progress Notes (Signed)
No specific parameters for BP per Dr. Corliss Skainseveshwar this morning, pt is currently in the 170s systolically, Cardene max'd out.  Dr. Pearlean BrownieSethi added 2 oral antihypertensives in effort to reduce Cardene rate.

## 2017-07-08 NOTE — Care Management Note (Signed)
Case Management Note  Patient Details  Name: Debra [name] MRN: [id number] Date of Birth: 01/05/1976  Subjective/Objective:   Pt admitted with expressive aphasia and RUE tingling with L MCA CVA.  PTA, pt independent, lives with parent.               Action/Plan: Pt started on Brilinta.  Pt given Brilinta $5 copay card for [employer].  She is appreciative of assistance.  PT/OT recommending no OP follow up.  Expected Discharge Date:  (unknown)               Expected Discharge Plan:  Home/Self Care  In-House Referral:     Discharge planning Services  CM Consult, Medication Assistance  Post Acute Care Choice:    Choice offered to:     DME Arranged:    DME Agency:     HH Arranged:    HH Agency:     Status of Service:  In process, will continue to follow  If discussed at Long Length of Stay Meetings, dates discussed:    Additional Comments:  Quintella BatonJulie W. Yazan Gatling, RN, BSN  Trauma/Neuro ICU Case Manager [phone]

## 2017-07-08 NOTE — Progress Notes (Signed)
Patients foley catheter was removed @ 1040 per MD order and patient request. Patient tolerated removal well. Patient is expected to void by 1640 (six hours after removal). Will continue to monitor patient at this time.

## 2017-07-08 NOTE — Anesthesia Postprocedure Evaluation (Signed)
Anesthesia Post Note  Patient: Debra Droughtiffany L [name]  Procedure(s) Performed: STENTING (N/A )     Patient location during evaluation: PACU Anesthesia Type: General Level of consciousness: awake and alert Pain management: pain level controlled Vital Signs Assessment: post-procedure vital signs reviewed and stable Respiratory status: spontaneous breathing, nonlabored ventilation and respiratory function stable Cardiovascular status: blood pressure returned to baseline and stable Postop Assessment: no apparent nausea or vomiting Anesthetic complications: no    Last Vitals:  Vitals:   07/08/17 0700 07/08/17 0730  BP: (!) 167/65   Pulse: 81 80  Resp: (!) 24 (!) 21  Temp:    SpO2: 98% 95%    Last Pain:  Vitals:   07/08/17 0400  TempSrc: Oral  PainSc:                  Cecile HearingStephen Edward Turk

## 2017-07-08 NOTE — Progress Notes (Signed)
Occupational Therapy Treatment Patient Details Name: DARL BRISBIN MRN: [id number] DOB: 1975-09-23 Today's Date: 07/08/2017    History of present illness Mana Limones is a 42yo admitted with expressive aphasia and RUE tingling with L MCA CVA. Pt with hypotension after arteriogram 3/15 with extension of infarct. Planned stent 3/18. PMHx: HTN, DM2, rt knee arthritis   OT comments  Pt progressing towards established OT goals. Pt performing toileting with Min Guard A and Min VCs. Performing grooming at sink with supervision. Pt requiring increased time during session for processing. Pt reports she is fatigued and having more difficulty with word finding today. Continue to recommend dc home once medically stable and will continue to follow acutely to facilitate safe dc.    Follow Up Recommendations  No OT follow up;Supervision - Intermittent    Equipment Recommendations  None recommended by OT    Recommendations for Other Services      Precautions / Restrictions Precautions Precautions: None Precaution Comments: SBP >150 Restrictions Weight Bearing Restrictions: No       Mobility Bed Mobility Overal bed mobility: Modified Independent             General bed mobility comments: Increased time to return to bed  Transfers Overall transfer level: Needs assistance Equipment used: None Transfers: Sit to/from Stand Sit to Stand: Supervision         General transfer comment: supervision for lines, no physical assist    Balance Overall balance assessment: Mild deficits observed, not formally tested                                         ADL either performed or assessed with clinical judgement   ADL Overall ADL's : Needs assistance/impaired     Grooming: Wash/dry hands;Set up;Supervision/safety;Standing Grooming Details (indicate cue type and reason): Pt performing hand hygiene with supervision             Lower Body Dressing: Set  up;Supervision/safety Lower Body Dressing Details (indicate cue type and reason): Pt doffing socks at EOB by using opposite foot to pull sock over heel Toilet Transfer: Solicitor;Ambulation Toilet Transfer Details (indicate cue type and reason): Pt performing toilet transfer with MIn guard for safety.  Toileting- Clothing Manipulation and Hygiene: Set up;Supervision/safety;Sit to/from stand;Cueing for sequencing Toileting - Clothing Manipulation Details (indicate cue type and reason): supervision for safety. VCs to make sure gown has been moved before using toilet.      Functional mobility during ADLs: Min guard General ADL Comments: Pt slow to process and required increased time.     Vision       Perception     Praxis      Cognition Arousal/Alertness: Awake/alert Behavior During Therapy: WFL for tasks assessed/performed Overall Cognitive Status: Impaired/Different from baseline Area of Impairment: Following commands                       Following Commands: Follows multi-step commands with increased time       General Comments: Pt slower processing today and required increased time for following commands and aswering questions. Pt reporting she is fatigued.        Exercises     Shoulder Instructions       General Comments Mother present throughout session    Pertinent Vitals/ Pain       Pain Assessment: No/denies pain  Home  Living                                          Prior Functioning/Environment              Frequency  Min 2X/week        Progress Toward Goals  OT Goals(current goals can now be found in the care plan section)  Progress towards OT goals: Progressing toward goals  Acute Rehab OT Goals Patient Stated Goal: get better OT Goal Formulation: With patient/family Time For Goal Achievement: 07/17/17 Potential to Achieve Goals: Good ADL Goals Pt Will Perform Grooming: standing;with caregiver  independent in assisting;with supervision;with set-up Pt Will Perform Lower Body Bathing: with caregiver independent in assisting;sit to/from stand;with supervision;with set-up Pt Will Perform Lower Body Dressing: with supervision;with set-up;with caregiver independent in assisting Pt Will Transfer to Toilet: with supervision;ambulating;with modified independence Pt Will Perform Toileting - Clothing Manipulation and hygiene: with modified independence;sit to/from stand;with caregiver independent in assisting Pt Will Perform Tub/Shower Transfer: with supervision;with modified independence;ambulating;with caregiver independent in assisting Additional ADL Goal #1: pt will verbalize safety techniques during ADL mobility tasks  Plan Discharge plan remains appropriate    Co-evaluation                 AM-PAC PT "6 Clicks" Daily Activity     Outcome Measure   Help from another person eating meals?: None Help from another person taking care of personal grooming?: None Help from another person toileting, which includes using toliet, bedpan, or urinal?: A Little Help from another person bathing (including washing, rinsing, drying)?: A Little Help from another person to put on and taking off regular upper body clothing?: A Little Help from another person to put on and taking off regular lower body clothing?: A Little 6 Click Score: 20    End of Session    OT Visit Diagnosis: Other abnormalities of gait and mobility (R26.89)   Activity Tolerance Patient tolerated treatment well   Patient Left in bed;with call bell/phone within reach;with family/visitor present   Nurse Communication Mobility status        Time: [id number] OT Time Calculation (min): 8 min  Charges: OT General Charges $OT Visit: 1 Visit OT Treatments $Self Care/Home Management : 8-22 mins  Bryan Omura MSOT, OTR/L Acute Rehab Pager: [phone] Office: [phone]   Theodoro GristCharis M Caden Fukushima 07/08/2017, 5:01  PM

## 2017-07-08 NOTE — Progress Notes (Signed)
Patients arterial line BP was reading high and not correlating with the BP cuff on patients leg. Arterial line had a wip in it after being leveled and re-zeroed. Patient was educated about the importance of having an her BP monitored via the BP cuff due to her BP being elevated and agreed to letting us take her BP via the cuff on her leg. Patient requested arterial line be removed because it was starting to "bother" her.

## 2017-07-08 NOTE — Progress Notes (Signed)
ANTICOAGULATION CONSULT NOTE   Pharmacy Consult for Heparin Indication: Post Interventional Neuroradiology Procedure  Allergies  Allergen Reactions  . Influenza Vaccines   . Metformin And Related Diarrhea  . Morphine Hives  . Augmentin [Amoxicillin-Pot Clavulanate] Rash    To bilat post hands only    Patient Measurements: Height: 5\' 8"  (172.7 cm) Weight: (!) 327 lb (148.3 kg) IBW/kg (Calculated) : 63.9 Heparin Dosing Weight: 103 kg  Vital Signs: Temp: 98.5 F (36.9 C) (03/18 2000) Temp Source: Oral (03/18 2000) BP: 171/73 (03/19 0030) Pulse Rate: 100 (03/19 0030)  Labs: Recent Labs    07/05/17 0349 07/06/17 1201 07/07/17 0443 07/07/17 1957 07/08/17 0013  HGB 13.5 12.5 11.8*  --   --   HCT 42.1 39.1 37.2  --   --   PLT 340 284 239  --   --   APTT  --   --   --  72*  --   LABPROT  --   --  13.7  --   --   INR  --   --  1.06  --   --   HEPARINUNFRC  --   --   --   --  0.13*  CREATININE 0.69 0.78  --   --   --     Estimated Creatinine Clearance: 139.9 mL/min (by C-G formula based on SCr of 0.78 mg/dL).  Assessment: 42 y.o. female s/p carotid stenting for heparin   Goal of Therapy:  Heparin level 0.1-0.25 units/ml Monitor platelets by anticoagulation protocol: Yes   Plan:  Continue Heparin at current rate   Geannie RisenGreg Marvel Mcphillips, PharmD, BCPS  07/08/2017,12:46 AM

## 2017-07-08 NOTE — Progress Notes (Signed)
Referring Physician(s): Lavera Guise  Supervising Physician: Luanne Bras  Patient Status:  [name] - In-pt  Chief Complaint:  LMCA angioplasty/stenting  Subjective:  Post-op day 1 s/p LMCA angioplasy/stenting on 07/07/2017. Patient is sitting up in bed feeding herself breakfast. Return to baseline of motor function, moves right arm/leg. Mild issues with word finding, but this is also her baseline.  Distal pulses intact. Right groin incision c/d/i.  Allergies: Influenza vaccines; Metformin and related; Morphine; and Augmentin [amoxicillin-pot clavulanate]  Medications: Prior to Admission medications   Medication Sig Start Date End Date Taking? Authorizing Provider  albuterol (PROAIR HFA) 108 (90 BASE) MCG/ACT inhaler Inhale 2 puffs into the lungs every 4 (four) hours as needed for wheezing or shortness of breath. 04/18/15  Yes Rowe Clack, MD  albuterol (PROVENTIL) (2.5 MG/3ML) 0.083% nebulizer solution Take 2.5 mg by nebulization every 6 (six) hours as needed for wheezing or shortness of breath.   Yes [provider]  blood glucose meter kit and supplies Dispense based on patient and insurance preference. Use up to four times daily as directed. (FOR ICD-9 250.00, 250.01). 06/03/14  Yes Rowe Clack, MD  diphenhydrAMINE (BENADRYL) 25 mg capsule Take 1 capsule (25 mg total) by mouth every 6 (six) hours as needed. Patient taking differently: Take 25 mg by mouth every 6 (six) hours as needed for itching or allergies.  07/13/16  Yes Shary Decamp, PA-C  fexofenadine (ALLEGRA) 180 MG tablet Take 180 mg by mouth daily as needed. For seasonal allergies   Yes [provider]  glipiZIDE (GLUCOTROL) 5 MG tablet TAKE 1 TABLET(5 MG) BY MOUTH TWICE DAILY BEFORE A MEAL 05/15/17  Yes Hoyt Koch, MD  glucose blood (ONE TOUCH ULTRA TEST) test strip Use as directed 10/28/13  Yes Rowe Clack, MD  losartan (COZAAR) 100 MG tablet TAKE 1 TABLET BY MOUTH DAILY  06/02/17  Yes Hoyt Koch, MD  mometasone (NASONEX) 50 MCG/ACT nasal spray Place 2 sprays into the nose daily as needed. For nasal spray   Yes [provider]  norethindrone (AYGESTIN) 5 MG tablet Take 5 mg by mouth daily.  07/06/13  Yes [provider]  TRULICITY 1.5 KC/1.2XN SOPN INJECT 1.5 MG INTO THE SKIN ONCE A WEEK 12/24/16  Yes Hoyt Koch, MD  benzonatate (TESSALON) 200 MG capsule Take 1 capsule (200 mg total) by mouth 3 (three) times daily as needed. Swallow whole-do not bite pill Patient not taking: Reported on 06/30/2017 04/19/17   Tower, Wynelle Fanny, MD  dextromethorphan-guaiFENesin [facility] DM) 30-600 MG 12hr tablet Take 1 tablet by mouth 2 (two) times daily as needed for cough. Patient not taking: Reported on 06/30/2017 02/18/17   Nche, Charlene Brooke, NP  doxycycline (VIBRA-TABS) 100 MG tablet Take 1 tablet (100 mg total) by mouth 2 (two) times daily. Patient not taking: Reported on 06/30/2017 04/24/17   Biagio Borg, MD  empagliflozin (JARDIANCE) 25 MG TABS tablet Take 25 mg by mouth daily. Patient not taking: Reported on 06/30/2017 04/24/17   Biagio Borg, MD  fluticasone [facility]) 50 MCG/ACT nasal spray Place 2 sprays into both nostrils daily. Patient not taking: Reported on 06/30/2017 09/23/16   Delano Metz, FNP  Fluticasone-Salmeterol (ADVAIR DISKUS) 250-50 MCG/DOSE AEPB Inhale 1 puff into the lungs 2 (two) times daily. Patient not taking: Reported on 06/30/2017 07/22/11   Rowe Clack, MD  HYDROcodone-homatropine [facility]) 5-1.5 MG/5ML syrup Take 5 mLs by mouth every 6 (six) hours as needed for cough. Patient  not taking: Reported on 06/30/2017 02/18/17   Nche, Charlene Brooke, NP  olopatadine (PATANOL) 0.1 % ophthalmic solution Place 1 drop into both eyes 2 (two) times daily. Patient not taking: Reported on 06/30/2017 08/08/12   Darlyne Russian, MD  sodium chloride (OCEAN) 0.65 % SOLN nasal spray Place 1 spray into both nostrils as needed for  congestion. Patient not taking: Reported on 06/30/2017 02/18/17   Nche, Charlene Brooke, NP     Vital Signs: BP (!) 137/56   Pulse 95   Temp 98.9 F (37.2 C) (Oral)   Resp 17   Ht 5' 8" (1.727 m)   Wt (!) 327 lb (148.3 kg)   LMP 07/28/2011   SpO2 97%   BMI 49.72 kg/m   Physical Exam  Constitutional: She is oriented to person, place, and time. She appears well-developed and well-nourished. No distress.  Cardiovascular: Normal rate, regular rhythm, normal heart sounds and intact distal pulses.  No murmur heard. Pulmonary/Chest: Effort normal and breath sounds normal. She has no wheezes.  Neurological: She is alert and oriented to person, place, and time.  Return to baseline- moves right arm/leg, feeding self. Mild issues with word finding, but this is at her baseline.  Skin: Skin is warm and dry.  Right groin incision c/d/i without hematoma or active bleeding.  Psychiatric: She has a normal mood and affect. Her behavior is normal. Judgment and thought content normal.  Nursing note and vitals reviewed.   Imaging: Ct Head Wo Contrast  Result Date: 07/07/2017 CLINICAL DATA:  Stroke-like symptoms following left MCA stent placement. EXAM: CT HEAD WITHOUT CONTRAST TECHNIQUE: Contiguous axial images were obtained from the base of the skull through the vertex without intravenous contrast. COMPARISON:  Cerebral angiogram 07/07/2017 Brain MRI 07/03/2017 Head CT 07/02/2017 FINDINGS: Brain: No mass lesion, intraparenchymal hemorrhage or extra-axial collection. No evidence of acute cortical infarct. Normal appearance of the brain parenchyma and extra axial spaces for age. Vascular: There is a stent within the left MCA. The stent extends most of the length of the M1 segment, terminating near the bifurcation. Skull: Normal visualized skull base, calvarium and extracranial soft tissues. Sinuses/Orbits: No sinus fluid levels or advanced mucosal thickening. No mastoid effusion. Normal orbits. IMPRESSION:  Status post left MCA stent placement. No hemorrhage or other acute finding. Electronically Signed   By: Ulyses Jarred M.D.   On: 07/07/2017 17:11   [name] Ekg Site Rite  Result Date: 07/05/2017 If Site Rite image not attached, placement could not be confirmed due to current cardiac rhythm.   Labs:  CBC: Recent Labs    07/05/17 0349 07/06/17 1201 07/07/17 0443 07/08/17 0607  WBC 13.5* 9.8 9.2 9.9  HGB 13.5 12.5 11.8* 11.0*  HCT 42.1 39.1 37.2 34.6*  PLT 340 284 239 247    COAGS: Recent Labs    06/30/17 2325 07/07/17 0443 07/07/17 1957  INR 1.06 1.06  --   APTT 27  --  72*    BMP: Recent Labs    07/03/17 1002 07/05/17 0349 07/06/17 1201 07/08/17 0607  NA 137 139 135 137  K 3.4* 3.4* 3.7 3.2*  CL 106 108 108 107  CO2 20* 19* 20* 20*  GLUCOSE 184* 205* 315* 236*  BUN <5* <5* 7 <5*  CALCIUM 8.2* 8.2* 7.7* 7.6*  CREATININE 0.77 0.69 0.78 0.70  GFRNONAA >60 >60 >60 >60  GFRAA >60 >60 >60 >60    LIVER FUNCTION TESTS: Recent Labs    06/30/17 2325  BILITOT 0.4  AST 16  ALT 18  ALKPHOS 60  PROT 7.1  ALBUMIN 3.6    Assessment and Plan:  LMCA angioplasty/stenting. Post-op day 1. Return to baseline of function. Groin intact. Continue ASA once a day and Brilinta twice a day as directed.  Electronically Signed: Earley Abide, PA-C 07/08/2017, 9:14 AM   I spent a total of 15 Minutes at the the patient's bedside AND on the patient's hospital floor or unit, greater than 50% of which was counseling/coordinating care for LMCA angioplasty/stenting.

## 2017-07-08 NOTE — Progress Notes (Signed)
STROKE TEAM PROGRESS NOTE   History Per Note Consult 07/01/2017 Debra [name] is a 42 y.o. female with HTN, T2DM, Endometriosis s/p hysterectomy/BSO, and seasonal allergies who presented after developing slurred speech, expressive aphasia, and slight numbness in her right 1st and 2nd fingers. She said her symptoms started around 10 pm on 06/30/16 while at church. She felt that she understood what others would say to her, but could not respond appropriately.   She knew what she wanted to say, but could not formulate the words verbally or spelling correctly via texting. She had associated right frontal headache and some increased weakness of her right leg compared to baseline. She says she presented to [facility] ED around 10:45 pm and her symptoms began to improve after she was given medications for HTN and headache. She feels near her baseline now. She denies any change in vision, chest pain, palpitations, dyspnea, or similar episodes in the past.   She does not take any antiplatelet medications or statins. She reports adherence to losartan for blood pressure. She reports a family history of stroke in her father.  In the ED an initial CT head showed hypoattenuation in the left parieto-occipital junction which may represent acute or subacute Infarction.   CTA head/neck was obtained which showed changes in the anterior and posterior circulation thought to represent cerebral vasculitis vs RCVS/PRES without large vessel occlusion, aneurysm, or AVM.   Subsequent MRI Brain WO contrast showed a cluster of acute infarctions in the posterior left temporal lobe and temporoparietal junction region consistent with embolic infarctions in the left MCA territory which the radiologist felt could be an extremely localized manifestation of PRES, but thought this was less likely.   LKW: 10 pm 06/30/17 tpa given?: no, NIH 0 Premorbid modified rankin scale: 0 ICH Score: 0, no ICH NIHSS: 0   SUBJECTIVE (INTERVAL  HISTORY) The patient's husband was at the bedside. The plan is to mobilize the patient, attempt to discontinue the Cardene, control blood pressure with oral agents, and prepare for discharge to home.  OBJECTIVE Vitals:   07/08/17 1200 07/08/17 1215 07/08/17 1230 07/08/17 1245  BP: (!) 176/66     Pulse: 87 81 80 78  Resp: (!) 28 18 (!) 25 (!) 22  Temp: 98.8 F (37.1 C)     TempSrc: Oral     SpO2: 98% 100% 97% 99%  Weight:      Height:        CBC:  Recent Labs  Lab 07/07/17 0443 07/08/17 0607  WBC 9.2 9.9  NEUTROABS 6.1 7.3  HGB 11.8* 11.0*  HCT 37.2 34.6*  MCV 84.0 84.4  PLT 239 191    Basic Metabolic Panel:  Recent Labs  Lab 07/03/17 1002  07/06/17 1201 07/08/17 0607  NA 137   < > 135 137  K 3.4*   < > 3.7 3.2*  CL 106   < > 108 107  CO2 20*   < > 20* 20*  GLUCOSE 184*   < > 315* 236*  BUN <5*   < > 7 <5*  CREATININE 0.77   < > 0.78 0.70  CALCIUM 8.2*   < > 7.7* 7.6*  MG 1.7  --   --   --    < > = values in this interval not displayed.    Lipid Panel:     Component Value Date/Time   CHOL 168 07/01/2017 0800   TRIG 60 07/01/2017 0800   HDL 27 (L) 07/01/2017 0800  CHOLHDL 6.2 07/01/2017 0800   VLDL 12 07/01/2017 0800   LDLCALC 129 (H) 07/01/2017 0800   HgbA1c:  Lab Results  Component Value Date   HGBA1C 8.5 (H) 07/01/2017   Urine Drug Screen:     Component Value Date/Time   LABOPIA NONE DETECTED 07/02/2017 0254   COCAINSCRNUR NONE DETECTED 07/02/2017 0254   LABBENZ NONE DETECTED 07/02/2017 0254   AMPHETMU NONE DETECTED 07/02/2017 0254   THCU NONE DETECTED 07/02/2017 0254   LABBARB NONE DETECTED 07/02/2017 0254    Alcohol Level No results found for: [place] Head Code Stroke Wo Contrast 06/30/2017 2329  1. Small 14 mm cortical focus of hypoattenuation within left parieto-occipital junction which may represent acute or subacute infarction. No hemorrhage or mass effect.  2. ASPECTS is 10   Ct Angio Head W Or Wo Contrast Ct Angio  Neck W Or Wo Contrast 07/01/2017  1. Multiple segments of arterial stenosis in the anterior and posterior circulation which may represent cerebral vasculitis or possibly reversible cerebral vasoconstriction syndrome (RCVS)/PRES given posterior distribution cortical abnormality.  2. No large vessel occlusion, aneurysm, or vascular malformation.  3. Widely patent carotid and vertebral arteries in the neck.   Ct Head Wo Contrast 07/01/2017 Stable infarct in the medial left occipital lobe. No new area of decreased attenuation evident by noncontrast enhanced CT. Note the noncontrast enhanced CT from 1 day prior as well as the current examination demonstrates less extensive infarct than is demonstrated on MR obtained earlier in the day. No hemorrhage or mass evident.   Mr Brain Wo Contrast 07/01/2017 0736 Cluster of acute infarctions in the posterior left temporal lobe and temporoparietal junction region consistent with embolic infarctions in the left MCA territory. No other abnormal brain parenchymal finding. Whereas this could represent an extremely localized manifestation of posterior reversible encephalopathy, I think that is less likely.   Ct Angio Head W Or Wo Contrast 07/02/2017 1. Persistent segments of high-grade stenosis and right proximal M2 superior division and left proximal M2 inferior division as well as segments of mild stenosis and bilateral PCA.  2. No new large vessel occlusion or aneurysm of the intracranial circulation.   Ct Angio Neck W Or Wo Contrast 07/02/2017 Patent carotid and vertebral arteries. No dissection, aneurysm, or hemodynamically significant stenosis utilizing NASCET criteria.   Ct Cerebral Perfusion W Contrast 07/02/2017 1. No region of CBF (<30%) to indicate new acute infarction.  2. Areas of infarction of left posterior MCA distribution have pseudonormalized perfusion.  3. Region of T-max > 6 seconds (8 cc) in adjacent left parietal lobe indicating penumbra.  4.  Larger region of T-max > 4 seconds (57 cc) extending into left posterior frontal lobe compatible with oligemia.   Mr Brain Wo Contrast 07/03/2017  IMPRESSION: Patchy acute infarct in the left posterior MCA and watershed distributions, moderate to large in total extent.  Transthoracic Echocardiogram  07/03/2017 Study Conclusions - Left ventricle: The cavity size was normal. Systolic function was   normal. The estimated ejection fraction was in the range of 60%   to 65%. Wall motion was normal; there were no regional wall   motion abnormalities. Impressions: - No cardiac source of emboli was indentified.    GENERAL EXAM:  Vitals:   07/08/17 1200 07/08/17 1215 07/08/17 1230 07/08/17 1245  BP: (!) 176/66     Pulse: 87 81 80 78  Resp: (!) 28 18 (!) 25 (!) 22  Temp: 98.8 F (37.1 C)  TempSrc: Oral     SpO2: 98% 100% 97% 99%  Weight:      Height:        Patient is in no distress.obese middle aged lady  Lying in bed with HOB at 30 degrees.  CARDIOVASCULAR: Regular rate and rhythm, no murmurs, no carotid bruits. Lungs clear.  NEUROLOGIC: MENTAL STATUS: awake, alert, speech hesitant with mild expressive aphasia with trouble naming and paraphasic errors. Comprehension mostly accurate, can follow 1&2 step commands well but difficulty with 3 step commands.  CRANIAL NERVE: decreased ability to count fingers suspect right quadrantonopsia. extraocular muscles intact, no nystagmus, R lower facial weakness from yesterday resolved. facial sensation symmetric, no tongue deviation. MOTOR: normal bulk and tone, full strength on the Left. Proximal strength on R normal. Decreased FMM on the R with left arm orbiting right.  SENSORY: normal and symmetric to light touch, temperature. Sensation intact; denies finger numbness COORDINATION: finger-nose-finger, fine finger movements, heel-shin normal GAIT/STATION: deferred.   ASSESSMENT/PLAN [name] is a 42 y.o. female with history of  HTN, type 2 DM, obesity, endometriosis/ovarian cyst s/p hysterectomy/SBO and seasonal allergies presenting initially to [facility] with slurred speech and expressive aphasia. She did not receive IV t-PA due to NIH 0.   Stroke:   Left posterior temporal lobe infarcts L MCA territory in setting of uncontrolled risk factors and severe bilateral M2 intracranial stenosis, infarcts felt to be secondary to large vessel disease source  Resultant  Mild expressive and receptive aphasia, mild R hand clumsiness  Symptoms have remained stable over night   CT head Small 14 mm cortical focus of hypoattenuation within left parieto-occipital junction possible acute infarct, ASPECTS 10  CTA H&N on admission Multiple anterior and posterior stenosis, no ELVO  Repeat CT head w/ worsening stable left medial occipital lobe infarct, no new abnormalities.   MRI head cluster of acute infarct posterior left temporal lobe and temporoparietal junction region in the L MCA territory  Repeat CTA head, neck unchanged bilateram high grade M2 stenosis.   CTP with small perfusion deficit, not amenable to intervention  Repeat MRI - Patchy acute infarct in the left posterior MCA and watershed distributions  2D Echo - No cardiac source of emboli was indentified. EF 60-65%. Cerebral angiogram 07/04/2017 - 1.75%  + stenosis of prox dominant LT MCA inf division 2.Approx 80% stenosis of RT MCA distal M1 seg       LDL 8.5  HgbA1c 12.9  SCDs for VTE prophylaxis  Diet Carb Modified Fluid consistency: Thin;   No antithrombotic prior to admission, now on aspirin 325 mg daily and clopidogrel 75 mg daily following plavix load yesterday. Given large vessel disease, anticipate plavix and aspirin x 3 months at time of discharge  Added albumin 5 x 4 doses yesterday for volume  Ongoing aggressive stroke risk factor management  At this time, do not suspect RCVS or cerebral vasculitis (received empiric pulse dose steroids) - f/u labs  (ESR 13, RA latex turbid 10.5, , ANCA titers neg, RF 10.5, , extractable nuclear antigen ab neg, CRP 1.0, CL ab w/ IGM 15 (indeterminate),  homo neg, B2GP neg, Lupus AC neg, Prot C&S neg, anti III  85  anti-jo a ab - normal  ANCA titers - normal  Factor 5 leiden - negative  PT gene mutation - negative   Therapy recommendations: No PT follow-up recommended  Disposition:  Anticipate return home w/ family (pt works as a Company secretary), await therapy evals  Hypertension  Elevated but  Stable, 170s  Permissive hypertension (OK if < 220/120) but gradually normalize in 5-7 days  Ok to continue home antihypertensives  Long-term BP goal - may need slightly elevated BP due to advanced atherosclerosis.  Hyperlipidemia  Home meds:  None  Now on Lipitor 80  LDL 129, goal < 70  Continue statin at discharge  Diabetes  HgbA1c 8.5, goal < 7.0  Uncontrolled  CBGs elevated  Other Stroke Risk Factors  Morbid Obesity, Body mass index is 49.72 kg/m., recommend weight loss, diet and exercise as appropriate. Pt s/p lap band surgery 5 years ago   UDS level neg  Home norethindrone (oral contraceptive) d/c'd on admission  Family hx stroke (father at age 31, L brain stroke with R HP and severe expressive aphasia)  No hx Migraines  Consider OP eval for obstructive sleep apnea given morbid obesity  Poorly controlled glucose. Will request diabetic nurse consult.  Intracranial PTA / stent today.  Hospital day # [place] PA-C [facility] Pager [phone] 07/08/2017, 2:19 PM   Personally examined patient and images, and have participated in and made any corrections needed to history, physical, neuro exam,assessment and plan as stated above.  I have personally obtained the history, evaluated lab date, reviewed imaging studies and agree with radiology interpretations.      42 year old female with history hypertension, diabetes, obesity admitted for episode  of aphasia, slow thought processing and right hand numbness as well as right temporal headache.  Symptom resolved on admission.  BP elevated.  CT  Showed left occipital stroke.  CTA of the neck showed bilateral M2 stenosis, questionable for our CVS or intracranial stenosis.  MRI showed left inferior MCA patchy small infarcts.  LDL 129 and A1c 8.5.  UDS negative.  Currently on aspirin 325 and Lipitor 80.   However, symptoms fluctuated with expressive aphasia and slurred speech on sitting up yesterday. repeat CTA head and neck continue to show b/l M1 and M2 stenosis, as well as b/l PCA stenosis. MRI repeat showed extension of infarct at left MCA territory.   Pt condition still more likely advanced atherosclerosis intracranially given risk factors including morbid obesity, HTN, HLD, and DM.   Not able to do continue strict blood pressure control with systolic in the 200-941 range.  Elective left MCA angioplasty tomorrow.  Explained the procedure to patient and family and answered questions.  LP due to plavix use. Continue DAPT and high dose lipitor  Plan  we will plan on discharge later if she is off the Cardene drip and follow-up as an outpatient in stroke clinic. Patient was counseled to be compliant with aspirin and went out to prevent stent occlusion. She voiced understanding Antony Contras, MD Medical Director [place] Pager: [phone] 07/08/2017 2:28 PM   To contact Stroke Continuity provider, please refer to [email or web address] After hours, contact General Neurology

## 2017-07-08 NOTE — Progress Notes (Signed)
Patient refuses BP cuff because she states it is too uncomfortable to her. MD is aware. Patient has arterial line in place, which has an accurate wave form and has been leveled and zeroed.

## 2017-07-08 NOTE — Progress Notes (Signed)
Physical Therapy Treatment Patient Details Name: Debra [name] MRN: [id number] DOB: 1975/05/19 Today's Date: 07/08/2017    History of Present Illness Debra [name] is a 42yo admitted with expressive aphasia and RUE tingling with L MCA CVA. Pt with hypotension after arteriogram 3/15 with extension of infarct. Planned stent 3/18. PMHx: HTN, DM2, rt knee arthritis    PT Comments    Pt with decreased activity tolerance with increased expressive aphasia today. Pt with increased difficulty with word finding, difficulty with dual task with gait and decreased gait compared to last session. Plan to perform stairs for D/C however pt unable to tolerate today. Pt encouraged to mobilize with nursing and will continue to further assess balance and gait.     Follow Up Recommendations  No PT follow up     Equipment Recommendations  None recommended by PT    Recommendations for Other Services       Precautions / Restrictions Precautions Precautions: None    Mobility  Bed Mobility               General bed mobility comments: in chair on arrival  Transfers Overall transfer level: Needs assistance   Transfers: Sit to/from Stand Sit to Stand: Supervision         General transfer comment: supervision for lines, no physical assist  Ambulation/Gait Ambulation/Gait assistance: Supervision Ambulation Distance (Feet): 300 Feet Assistive device: None Gait Pattern/deviations: Step-through pattern;Decreased stride length   Gait velocity interpretation: Below normal speed for age/gender General Gait Details: slow steady gait with increased fatigue and decreased activity tolerance with inability to dual task with gait    Stairs            Wheelchair Mobility    Modified Rankin (Stroke Patients Only) Modified Rankin (Stroke Patients Only) Pre-Morbid Rankin Score: No significant disability Modified Rankin: Slight disability     Balance Overall balance assessment: Mild  deficits observed, not formally tested                                          Cognition Arousal/Alertness: Awake/alert Behavior During Therapy: WFL for tasks assessed/performed Overall Cognitive Status: Within Functional Limits for tasks assessed                                        Exercises      General Comments        Pertinent Vitals/Pain Pain Assessment: No/denies pain    Home Living                      Prior Function            PT Goals (current goals can now be found in the care plan section) Progress towards PT goals: Progressing toward goals    Frequency           PT Plan Current plan remains appropriate;Frequency needs to be updated    Co-evaluation              AM-PAC PT "6 Clicks" Daily Activity  Outcome Measure  Difficulty turning over in bed (including adjusting bedclothes, sheets and blankets)?: None Difficulty moving from lying on back to sitting on the side of the bed? : None Difficulty sitting down on and standing up from a chair  with arms (e.g., wheelchair, bedside commode, etc,.)?: None Help needed moving to and from a bed to chair (including a wheelchair)?: A Little Help needed walking in hospital room?: A Little Help needed climbing 3-5 steps with a railing? : A Little 6 Click Score: 21    End of Session Equipment Utilized During Treatment: Gait belt Activity Tolerance: Patient tolerated treatment well Patient left: in chair;with family/visitor present;with call bell/phone within reach Nurse Communication: Mobility status PT Visit Diagnosis: Other symptoms and signs involving the nervous system (E33.295(R29.898)     Time: [id number] PT Time Calculation (min) (ACUTE ONLY): 21 min  Charges:  $Gait Training: 8-22 mins                    G Codes:       Delaney MeigsMaija Tabor Zakyah Yanes, PT [phone]    Arlene Genova B Abas Leicht 07/08/2017, 1:11 PM

## 2017-07-09 ENCOUNTER — Encounter (HOSPITAL_COMMUNITY): Payer: Self-pay | Admitting: Interventional Radiology

## 2017-07-09 LAB — CBC
HEMATOCRIT: 34.8 % — AB (ref 36.0–46.0)
HEMOGLOBIN: 11.1 g/dL — AB (ref 12.0–15.0)
MCH: 26.7 pg (ref 26.0–34.0)
MCHC: 31.9 g/dL (ref 30.0–36.0)
MCV: 83.7 fL (ref 78.0–100.0)
Platelets: 267 10*3/uL (ref 150–400)
RBC: 4.16 MIL/uL (ref 3.87–5.11)
RDW: 13 % (ref 11.5–15.5)
WBC: 9.3 10*3/uL (ref 4.0–10.5)

## 2017-07-09 LAB — BASIC METABOLIC PANEL
Anion gap: 9 (ref 5–15)
BUN: <5 mg/dL — ABNORMAL LOW (ref 6–20)
CALCIUM: 8.2 mg/dL — AB (ref 8.9–10.3)
CHLORIDE: 105 mmol/L (ref 101–111)
CO2: 23 mmol/L (ref 22–32)
Creatinine, Ser: 0.67 mg/dL (ref 0.44–1.00)
GFR calc non Af Amer: >60 mL/min (ref >60)
Glucose, Bld: 177 mg/dL — ABNORMAL HIGH (ref 65–99)
POTASSIUM: 3.5 mmol/L (ref 3.5–5.1)
Sodium: 137 mmol/L (ref 135–145)

## 2017-07-09 LAB — GLUCOSE, CAPILLARY: GLUCOSE-CAPILLARY: 177 mg/dL — AB (ref 65–99)

## 2017-07-09 LAB — MAGNESIUM: Magnesium: 1.8 mg/dL (ref 1.7–2.4)

## 2017-07-09 MED ORDER — ATORVASTATIN CALCIUM 80 MG PO TABS: 80.0000 mg | ORAL_TABLET | Freq: Every day | ORAL | 1 refills | Status: DC

## 2017-07-09 MED ORDER — POTASSIUM CHLORIDE 20 MEQ/15ML (10%) PO SOLN: 40.0000 meq | Freq: Two times a day (BID) | ORAL | 0 refills | Status: DC

## 2017-07-09 MED ORDER — TICAGRELOR 90 MG PO TABS: 90.0000 mg | ORAL_TABLET | Freq: Two times a day (BID) | ORAL | 2 refills | Status: DC

## 2017-07-09 MED ORDER — ASPIRIN 325 MG PO TABS
325.0000 mg | ORAL_TABLET | Freq: Every day | ORAL | Status: DC
  Administered 2017-07-09: 325 mg via ORAL
  Filled 2017-07-09: qty 1

## 2017-07-09 MED ORDER — ASPIRIN 325 MG PO TABS: 325.0000 mg | ORAL_TABLET | Freq: Every day | ORAL | 1 refills | Status: DC

## 2017-07-09 MED ORDER — AMLODIPINE BESYLATE 10 MG PO TABS: 10.0000 mg | ORAL_TABLET | Freq: Every day | ORAL | 1 refills | Status: DC

## 2017-07-09 MED ORDER — INSULIN GLARGINE 100 UNIT/ML ~~LOC~~ SOLN: 15.0000 [IU] | Freq: Every day | SUBCUTANEOUS | 11 refills | Status: DC

## 2017-07-09 NOTE — Progress Notes (Signed)
Referring Physician(s): Rosalin Hawking  Supervising Physician: Luanne Bras  Patient Status:  [facility] - In-pt  Chief Complaint: [facility] CV s/p revascularization  Subjective:  (L)MCA CV s/p revascularization 07/07/2017. Patient alert sitting in bed. Motor function return to baseline, spontaneously moves all 4 extremities. Difficulty word finding also returned to baseline. Denies H/A, seizures, weakness, or new complaints at this time. Right groin c/d/i. Neurology plans for discharge this afternoon.  Allergies: Influenza vaccines; Metformin and related; Morphine; and Augmentin [amoxicillin-pot clavulanate]  Medications: Prior to Admission medications   Medication Sig Start Date End Date Taking? Authorizing Provider  albuterol (PROAIR HFA) 108 (90 BASE) MCG/ACT inhaler Inhale 2 puffs into the lungs every 4 (four) hours as needed for wheezing or shortness of breath. 04/18/15  Yes Rowe Clack, MD  albuterol (PROVENTIL) (2.5 MG/3ML) 0.083% nebulizer solution Take 2.5 mg by nebulization every 6 (six) hours as needed for wheezing or shortness of breath.   Yes [provider]  blood glucose meter kit and supplies Dispense based on patient and insurance preference. Use up to four times daily as directed. (FOR ICD-9 250.00, 250.01). 06/03/14  Yes Rowe Clack, MD  diphenhydrAMINE (BENADRYL) 25 mg capsule Take 1 capsule (25 mg total) by mouth every 6 (six) hours as needed. Patient taking differently: Take 25 mg by mouth every 6 (six) hours as needed for itching or allergies.  07/13/16  Yes Shary Decamp, PA-C  fexofenadine (ALLEGRA) 180 MG tablet Take 180 mg by mouth daily as needed. For seasonal allergies   Yes [provider]  glipiZIDE (GLUCOTROL) 5 MG tablet TAKE 1 TABLET(5 MG) BY MOUTH TWICE DAILY BEFORE A MEAL 05/15/17  Yes Hoyt Koch, MD  glucose blood (ONE TOUCH ULTRA TEST) test strip Use as directed 10/28/13  Yes Rowe Clack, MD  losartan  (COZAAR) 100 MG tablet TAKE 1 TABLET BY MOUTH DAILY 06/02/17  Yes Hoyt Koch, MD  mometasone (NASONEX) 50 MCG/ACT nasal spray Place 2 sprays into the nose daily as needed. For nasal spray   Yes [provider]  norethindrone (AYGESTIN) 5 MG tablet Take 5 mg by mouth daily.  07/06/13  Yes [provider]  TRULICITY 1.5 QK/8.6NO SOPN INJECT 1.5 MG INTO THE SKIN ONCE A WEEK 12/24/16  Yes Hoyt Koch, MD  amLODipine (NORVASC) 10 MG tablet Take 1 tablet (10 mg total) by mouth daily. 07/09/17   Garvin Fila, MD  aspirin 325 MG tablet Take 1 tablet (325 mg total) by mouth daily. 07/09/17   Garvin Fila, MD  atorvastatin (LIPITOR) 80 MG tablet Take 1 tablet (80 mg total) by mouth daily at 6 PM. 07/09/17   Garvin Fila, MD  benzonatate (TESSALON) 200 MG capsule Take 1 capsule (200 mg total) by mouth 3 (three) times daily as needed. Swallow whole-do not bite pill Patient not taking: Reported on 06/30/2017 04/19/17   Tower, Wynelle Fanny, MD  dextromethorphan-guaiFENesin [facility] DM) 30-600 MG 12hr tablet Take 1 tablet by mouth 2 (two) times daily as needed for cough. Patient not taking: Reported on 06/30/2017 02/18/17   Nche, Charlene Brooke, NP  doxycycline (VIBRA-TABS) 100 MG tablet Take 1 tablet (100 mg total) by mouth 2 (two) times daily. Patient not taking: Reported on 06/30/2017 04/24/17   Biagio Borg, MD  empagliflozin (JARDIANCE) 25 MG TABS tablet Take 25 mg by mouth daily. Patient not taking: Reported on 06/30/2017 04/24/17   Biagio Borg, MD  fluticasone [facility]) 50 MCG/ACT nasal  spray Place 2 sprays into both nostrils daily. Patient not taking: Reported on 06/30/2017 09/23/16   Delano Metz, FNP  Fluticasone-Salmeterol (ADVAIR DISKUS) 250-50 MCG/DOSE AEPB Inhale 1 puff into the lungs 2 (two) times daily. Patient not taking: Reported on 06/30/2017 07/22/11   Rowe Clack, MD  HYDROcodone-homatropine [facility]) 5-1.5 MG/5ML syrup Take 5 mLs by mouth every 6 (six)  hours as needed for cough. Patient not taking: Reported on 06/30/2017 02/18/17   Nche, Charlene Brooke, NP  insulin glargine (LANTUS) 100 UNIT/ML injection Inject 0.15 mLs (15 Units total) into the skin at bedtime. 07/09/17   Garvin Fila, MD  olopatadine (PATANOL) 0.1 % ophthalmic solution Place 1 drop into both eyes 2 (two) times daily. Patient not taking: Reported on 06/30/2017 08/08/12   Darlyne Russian, MD  potassium chloride 20 MEQ/15ML (10%) SOLN Take 30 mLs (40 mEq total) by mouth 2 (two) times daily. 07/09/17   Garvin Fila, MD  sodium chloride (OCEAN) 0.65 % SOLN nasal spray Place 1 spray into both nostrils as needed for congestion. Patient not taking: Reported on 06/30/2017 02/18/17   Nche, Charlene Brooke, NP  ticagrelor (BRILINTA) 90 MG TABS tablet Take 1 tablet (90 mg total) by mouth 2 (two) times daily. 07/09/17   Garvin Fila, MD     Vital Signs: BP (!) 161/64   Pulse (!) 110   Temp 98.5 F (36.9 C) (Oral)   Resp 18   Ht '5\' 8"'  (1.727 m)   Wt (!) 327 lb (148.3 kg)   LMP 07/28/2011   SpO2 100%   BMI 49.72 kg/m   Physical Exam  Constitutional: She is oriented to person, place, and time. She appears well-developed and well-nourished. No distress.  Cardiovascular: Normal rate, regular rhythm, normal heart sounds and intact distal pulses.  No murmur heard. Pulmonary/Chest: Effort normal and breath sounds normal. She has no wheezes.  Neurological: She is alert and oriented to person, place, and time.  Motor function back to baseline, spontaneously moves all 4 extremities. Baseline difficulty word finding.  Skin: Skin is warm and dry.  Right groin incision c/d/i without hematoma or active bleeding.  Psychiatric: She has a normal mood and affect. Her behavior is normal. Judgment and thought content normal.  Nursing note and vitals reviewed.   Imaging: Ct Head Wo Contrast  Result Date: 07/07/2017 CLINICAL DATA:  Stroke-like symptoms following left MCA stent placement.  EXAM: CT HEAD WITHOUT CONTRAST TECHNIQUE: Contiguous axial images were obtained from the base of the skull through the vertex without intravenous contrast. COMPARISON:  Cerebral angiogram 07/07/2017 Brain MRI 07/03/2017 Head CT 07/02/2017 FINDINGS: Brain: No mass lesion, intraparenchymal hemorrhage or extra-axial collection. No evidence of acute cortical infarct. Normal appearance of the brain parenchyma and extra axial spaces for age. Vascular: There is a stent within the left MCA. The stent extends most of the length of the M1 segment, terminating near the bifurcation. Skull: Normal visualized skull base, calvarium and extracranial soft tissues. Sinuses/Orbits: No sinus fluid levels or advanced mucosal thickening. No mastoid effusion. Normal orbits. IMPRESSION: Status post left MCA stent placement. No hemorrhage or other acute finding. Electronically Signed   By: Ulyses Jarred M.D.   On: 07/07/2017 17:11   Ir Intra Cran Stent  Result Date: 07/09/2017 CLINICAL DATA:  Bilateral significant middle cerebral artery stenosis, symptomatic left-sided severe middle cerebral artery stenosis failing medical treatment. Hemodynamically significant with resultant reversible expressive aphasia and right hemiplegia. EXAM: INTRACRANIAL STENT (INCL PTA) COMPARISON:  CT angiogram  of 07/01/2017, MRI of the brain of 07/03/2017, and diagnostic catheter arteriogram of 07/04/2017. MEDICATIONS: Heparin 3,000 units IV; vancomycin 1.5 g IV antibiotic was administered within 1 hour of the procedure. ANESTHESIA/SEDATION: General anesthesia. CONTRAST:  Isovue 300 approximately 120 mL. FLUOROSCOPY TIME:  Fluoroscopy Time: 46 minutes 0 seconds (4075 mGy). COMPLICATIONS: None immediate. TECHNIQUE: Informed written consent was obtained from the patient after a thorough discussion of the procedural risks, benefits and alternatives. All questions were addressed. Maximal Sterile Barrier Technique was utilized including caps, mask, sterile gowns,  sterile gloves, sterile drape, hand hygiene and skin antiseptic. A timeout was performed prior to the initiation of the procedure. The right groin was prepped and draped in the usual sterile fashion. Thereafter using modified Seldinger technique, transfemoral access into the right common femoral artery was obtained without difficulty. Over a 0.035 inch guidewire, a 5 French Pinnacle sheath was inserted. Through this, and also over 0.035 inch guidewire, a 5 [place] JB 1 catheter was advanced to the aortic arch region and selectively positioned in the left common carotid artery. Arteriograms were then performed centered extra cranially and intracranially. FINDINGS: The left common carotid arteriogram again demonstrates wide patency of the left external carotid artery origin and its branches. The left internal carotid artery at the bulb to the cranial skull base opacifies widely. The petrous, the cavernous and the supraclinoid segments demonstrate wide patency. A left posterior communicating artery is seen opacifying partially the left posterior cerebral artery distribution. The left middle cerebral artery and the left anterior cerebral artery opacify into the capillary and venous phases. Magnified oblique images again demonstrate the symptomatic high-grade stenosis of the left M1 segment, and also the distal M1 segment with tandem severe stenosis. Measurements were performed of the left middle cerebral artery dominant inferior division distal and proximal to the areas of the tandem severe stenosis. PROCEDURE: The diagnostic JB 1 catheter in the left common carotid artery was exchanged over a 0.035 inch 300 cm Rosen exchange guidewire for an 80 cm 6 [place] Cook shuttle sheath using biplane roadmap technique and constant fluoroscopic guidance. Good aspiration obtained from the hub of the [facility] shuttle sheath, the tip of which was just proximal to the left common carotid bifurcation. This was then connected to continuous  heparinized saline infusion. Over a 0.035 inch Roadrunner guidewire, the 6 [place] Cook shuttle sheath was then advanced to the distal 1/3 of the cervical segment of the left internal carotid artery. The guidewire was removed. Good aspiration obtained from the hub of the [facility] shuttle sheath. A control arteriogram performed through the [facility] shuttle sheath centered intracranially demonstrated no angiographic changes. A Sofia 5 French 124 cm guide catheter was then advanced over a 0.035 inch Roadrunner guidewire to the petrous cavernous junction of the left internal carotid artery using biplane roadmap technique and constant fluoroscopic guidance. The guidewire was removed. Good aspiration obtained from the hub of the [place] guide catheter. A gentle contrast injection demonstrated no change in the intracranial circulation. At this time, a Trevo ProVue 021 microcatheter was advanced over a 0.014 inch Softip Synchro micro guidewire to the distal end of the H. Rivera Colon guide catheter. With the micro guidewire leading with a J-tip configuration, the combination was advanced with the micro guidewire first being advanced to the distal M1 segment followed by the microcatheter. Under constant magnified roadmap technique, careful manipulation enabled advancement of the micro guidewire to the M2 M3 region of the dominant inferior division of the left middle  cerebral artery. However, advancement of the Trevo ProVue 021 microcatheter was met with significant resistance especially at the acute bend at the site of severe proximal stenosis. The micro guidewire and the microcatheter were then retrieved and removed. A Headway 2 tip microcatheter was then advanced over a 0.014 inch Softip Synchro micro guidewire through the Harold guide catheter which was now in the left internal carotid artery supraclinoid segment. The guidewire was then gently manipulated using a torque device through the proximal and then the more  distal tandem stenosis of the inferior division followed by the microcatheter. There was no resistance encountered at the site of the stenosis proximally and distally. The microcatheter was advanced to the M2 M3 region of the left middle cerebral artery inferior division. The guidewire was removed. Good aspiration obtained from the hub of the microcatheter. A gentle contrast injection demonstrated free antegrade flow distally. This was then connected to continuous heparinized saline infusion. At this time, a 4.5 mm x 30 mm Neuroform Atlas stent was advanced in a coaxial manner and with constant heparinized saline infusion using biplane roadmap technique and constant fluoroscopic guidance to the distal end of the Headway microcatheter. Once the proximal and the distal landing zones had been defined, the O ring on the delivery microcatheter was loosened. With slight forward gentle traction with the right hand on the delivery micro guidewire, with the left hand the delivery microcatheter was retrieved slowly and deliberately under constant angiographic surveillance. Once the entire device had been deployed such that the proximal tines of the stent were in the more proximal inferior division of the left middle cerebral artery, the micro guidewire was retrieved and removed without entanglement with the stent. Control arteriogram performed through the [place] guide catheter in the left internal carotid supraclinoid segment demonstrated excellent flow through the stented segment of the inferior division. The superior division remained widely patent unchanged. The delivery microcatheter was then completely retrieved and removed. Control arteriograms performed through the [place] guide catheter iin the left internal carotid artery continued to demonstrate excellent flow through the now significantly improved caliber and flow through the inferior division. Over the next 30 minutes, there was no evidence of  intraluminal irregularities or of filling defects. The patient was still given approximately 7.5 mg of super selective intracranial intra-arterial Integrilin in the left middle cerebral artery. A final control arteriogram performed through the [place] guide catheter in the left internal carotid artery demonstrated significant improved caliber and flow through the stented segment of the inferior division of the left middle cerebral artery and the entirety of the left middle cerebral artery and the anterior cerebral artery. The patient's neurological status and hemodynamic status remained stable throughout the procedure. The patient's ACT was maintained in the region of approximately 180-200 seconds throughout the procedure. The [place] guide catheter was then retrieved and removed. The [facility] Iv shuttle sheath was retrieved into the abdominal aorta and successfully removed with the application of a 6 French Angio-Seal closure device in the right groin. There was no evidence of bleeding or hematoma at the end of procedure. The patient's distal pulses remained palpable in the dorsalis pedis bilaterally and the posterior tibials bilaterally unchanged from prior to the procedure. The patient's general anesthesia was then reversed. Upon recovery the patient was extubated without difficulty. She continued to have significant expressive aphasia although her right leg and right upper extremity started to gradually improve in motor strength. The patient underwent a  stat CT scan of the brain to ensure no intracranial hemorrhage. Known was observed. The patient was then taken to the PACU to continue on low-dose IV heparin and for her blood pressure to be in the higher 160s to 170s mm mercury arranged for improved perfusion. Toward the early evening, the patient's right-sided motor strength and speech pre procedural baseline levels. The patient was maintained on IV heparin overnight which was then stopped. The patient  will be left on aspirin 81 mg a day, and Brilinta 90 mg a day for least 90 days. The patient will be seen in the follow-up clinic in 2-3 weeks from the time of her discharge. She was asked to maintain adequate hydration. This was also discussed with the patient's referring neurologist. IMPRESSION: Status final intracranial stenting for symptomatic severe tandem stenosis of the proximal dominant inferior division of the left middle cerebral artery with significantly improved caliber and flow through the treated middle cerebral artery branch. PLAN: The patient will be seen in follow-up in the clinic in 3-4 weeks following her discharge. Electronically Signed   By: Luanne Bras M.D.   On: 07/08/2017 12:10    Labs:  CBC: Recent Labs    07/06/17 1201 07/07/17 0443 07/08/17 0607 07/09/17 0606  WBC 9.8 9.2 9.9 9.3  HGB 12.5 11.8* 11.0* 11.1*  HCT 39.1 37.2 34.6* 34.8*  PLT 284 239 247 267    COAGS: Recent Labs    06/30/17 2325 07/07/17 0443 07/07/17 1957  INR 1.06 1.06  --   APTT 27  --  72*    BMP: Recent Labs    07/05/17 0349 07/06/17 1201 07/08/17 0607 07/09/17 0606  NA 139 135 137 137  K 3.4* 3.7 3.2* 3.5  CL 108 108 107 105  CO2 19* 20* 20* 23  GLUCOSE 205* 315* 236* 177*  BUN <5* 7 <5* <5*  CALCIUM 8.2* 7.7* 7.6* 8.2*  CREATININE 0.69 0.78 0.70 0.67  GFRNONAA >60 >60 >60 >60  GFRAA >60 >60 >60 >60    LIVER FUNCTION TESTS: Recent Labs    06/30/17 2325  BILITOT 0.4  AST 16  ALT 18  ALKPHOS 60  PROT 7.1  ALBUMIN 3.6    Assessment and Plan:  (L)MCA CV s/p revascularization 07/07/2017. Return to baseline of motor function and difficulty word finding. Groin intact. Continue ASA once a day and Brilinta twice a day as directed. Appreciate Neurology management, agree with discharge plans. Follow-up with Dr. Estanislado Pandy 2-3 weeks after discharge to monitor P2Y12 level.  Electronically Signed: Earley Abide, PA-C 07/09/2017, 11:07 AM   I spent a total of 15  Minutes at the the patient's bedside AND on the patient's hospital floor or unit, greater than 50% of which was counseling/coordinating care for [facility] CV s/p revascularization.

## 2017-07-09 NOTE — Progress Notes (Signed)
Patient was discharged per MD order. All discharge paper work was given to patient and patient stated that she did not have any questions or concerns regarding discharge. Central line was removed was removed by IV team and site was clean, dry, and intact at discharge. [place]

## 2017-07-21 ENCOUNTER — Telehealth: Payer: Self-pay | Admitting: Neurology

## 2017-07-21 ENCOUNTER — Encounter: Payer: Self-pay | Admitting: Internal Medicine

## 2017-07-21 ENCOUNTER — Ambulatory Visit: Payer: Self-pay

## 2017-07-21 ENCOUNTER — Ambulatory Visit: Payer: BC Managed Care – PPO | Admitting: Internal Medicine

## 2017-07-21 DIAGNOSIS — I6602 Occlusion and stenosis of left middle cerebral artery: Secondary | ICD-10-CM | POA: Diagnosis not present

## 2017-07-21 DIAGNOSIS — Z8673 Personal history of transient ischemic attack (TIA), and cerebral infarction without residual deficits: Secondary | ICD-10-CM

## 2017-07-21 DIAGNOSIS — E785 Hyperlipidemia, unspecified: Secondary | ICD-10-CM

## 2017-07-21 DIAGNOSIS — N809 Endometriosis, unspecified: Secondary | ICD-10-CM | POA: Diagnosis not present

## 2017-07-21 DIAGNOSIS — I10 Essential (primary) hypertension: Secondary | ICD-10-CM | POA: Diagnosis not present

## 2017-07-21 DIAGNOSIS — E1165 Type 2 diabetes mellitus with hyperglycemia: Secondary | ICD-10-CM

## 2017-07-21 MED ORDER — INSULIN GLARGINE-LIXISENATIDE 100-33 UNT-MCG/ML ~~LOC~~ SOPN: 15.0000 [IU] | PEN_INJECTOR | Freq: Every day | SUBCUTANEOUS | 6 refills | Status: DC

## 2017-07-21 NOTE — Telephone Encounter (Signed)
Noted  

## 2017-07-21 NOTE — Telephone Encounter (Signed)
Nesa with [facility] is calling regarding insulin glargine (LANTUS) 100 UNIT/ML injection that Dr. Pearlean BrownieSethi prescribed in the hospital.  Insurance will not pay for this and wants to discuss changing to another medication.

## 2017-07-21 NOTE — Telephone Encounter (Signed)
Patient called with c/o "high blood sugar."  She says "I just got out of the hospital 2 weeks ago and I was supposed to be on Lantus insulin when I got home. My insurance denied it. I wasn't able to check my blood sugar last week because I was so out of it from being out the hospital and the stroke. I got myself together enough to check it yesterday and today and it was 250 both days. I figured I would call to be seen to get back on the insulin." I asked about taking the pills, she says "they didn't take me off of those, so I've been taking them as prescribed."  I asked about symptoms of weakness, frequent urination, fever, weakness, vomiting, she says "none of those, but it's hard to tell if the weakness is from my stroke or the blood sugar."  According to protocol, see PCP within 24 hours, appointment made for today at 1315, care advice given, patient verbalized understanding.  Reason for Disposition . [1] Symptoms of high blood sugar (e.g., frequent urination, weak, weight loss) AND [2] not able to test blood glucose  Answer Assessment - Initial Assessment Questions 1. BLOOD GLUCOSE: "What is your blood glucose level?"      250 2. ONSET: "When did you check the blood glucose?"     Today 3. USUAL RANGE: "What is your glucose level usually?" (e.g., usual fasting morning value, usual evening value)     147 4. KETONES: "Do you check for ketones (urine or blood test strips)?" If yes, ask: "What does the test show now?"      N/A 5. TYPE 1 or 2:  "Do you know what type of diabetes you have?"  (e.g., Type 1, Type 2, Gestational; doesn't know)      Type 2 6. INSULIN: "Do you take insulin?" If yes, ask: "Have you missed any shots recently?"     I took it in the hospital 7. DIABETES PILLS: "Do you take any pills for your diabetes?" If yes, ask: "Have you missed taking any pills recently?"     Yes, missed 1 week of trulicity, but took pills 8. OTHER SYMPTOMS: "Do you have any symptoms?" (e.g., fever,  frequent urination, difficulty breathing, dizziness, weakness, vomiting)     None 9. PREGNANCY: "Is there any chance you are pregnant?" "When was your last menstrual period?"     No  Protocols used: DIABETES - HIGH BLOOD SUGAR-A-AH

## 2017-07-21 NOTE — Progress Notes (Signed)
   Subjective:    Patient ID: Debra [name], female    DOB: 03/08/1976, 42 y.o.   MRN: [id number]  HPI The patient is a 42 YO female coming in for hospital follow up (in for stroke with stenting done for poor circulation, had been off medications prior to hospital, with htn and diabetes poorly controlled). Since leaving the hospital she has not been doing well. She was not able to get insulin as her insurance did not cover. She called neurology who discharged her and they did not help her and advised that she come see [name]. She is having sugars 200-300 in the last several days. Still taking her oral meds glipizide. Still taking trulicity. She felt that the discharge instructions were not clear as some things she was told that were not on her list and other things were on the list and she was not told. She was not sent home with PT/OT/SLP but is still having some hesitation with her speech. 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 She denies worsening since leaving the hospital. Some fatigue and tiredness more than usual.   PMH, [facility], social history reviewed and updated.   Review of Systems  Constitutional: Positive for activity change and appetite change.  HENT: Negative.   Eyes: Negative.   Respiratory: Negative for cough, chest tightness and shortness of breath.   Cardiovascular: Negative for chest pain, palpitations and leg swelling.  Gastrointestinal: Negative for abdominal distention, abdominal pain, constipation, diarrhea, nausea and vomiting.  Musculoskeletal: Negative.   Skin: Negative.   Neurological: Positive for speech difficulty and weakness. Negative for dizziness, facial asymmetry, light-headedness, numbness and headaches.  Psychiatric/Behavioral: Negative.       Objective:   Physical Exam  Constitutional: She is oriented to person, place, and time. She appears well-developed and well-nourished.  overweight    HENT:  Head: Normocephalic and atraumatic.  Eyes: EOM are normal.  Neck: Normal range of motion.  Cardiovascular: Normal rate and regular rhythm.  Pulmonary/Chest: Effort normal and breath sounds normal. No respiratory distress. She has no wheezes. She has no rales.  Abdominal: Soft. Bowel sounds are normal. She exhibits no distension. There is no tenderness. There is no rebound.  Musculoskeletal: She exhibits no edema.  Neurological: She is alert and oriented to person, place, and time. Coordination abnormal.  Speech is hesitating during exam, slow deliberate gait  Skin: Skin is warm and dry.  Psychiatric: She has a normal mood and affect.   Vitals:   07/21/17 1314  BP: 138/84  Pulse: 85  Temp: 98.6 F (37 C)  TempSrc: Oral  SpO2: 99%  Weight: (!) 312 lb (141.5 kg)  Height:  (1.727 m)      Assessment & Plan:

## 2017-07-21 NOTE — Telephone Encounter (Signed)
Rn call Nesa back about lantus for patient. RN stated pt was a diabetic,and on medications prior to being hospitalized. Pt was seen for a stroke. Rn stated patient suppose to follow up with her primary doctor for the hospital follow up,and go over any new medications. Rn stated Dr. Pearlean BrownieSethi does not manage diabetes only the stroke diagnosis. Dr. Pearlean BrownieSethi was discharging patient for the stroke. Nesa verbalized understanding, and will tell the patient.

## 2017-07-21 NOTE — Patient Instructions (Addendum)
We will get OT and speech therapy coming to the house.  We have sent in the new medicine soliqua. Start off taking 15 units daily. After 4 days if the blood sugar is still >120 in the morning before eating increase 2 units up to a maximum dose of 25 units per day.   Stop taking trulicity when starting soliqua. Keep taking glipizide.   Do not take the aspirin until I verify with the neurologist.   Stop taking the birth control pills.

## 2017-07-22 ENCOUNTER — Telehealth: Payer: Self-pay | Admitting: Internal Medicine

## 2017-07-22 NOTE — Assessment & Plan Note (Signed)
Will clarify with neurology what antiplatelet she should be on since med list does not match what was told patient and that does not match written description of regimen on D/C summary. She is currently taking brilinta and plavix and asked her to continue that only until we get an answer and then adjust as needed.

## 2017-07-22 NOTE — Telephone Encounter (Signed)
Copied from CRM [id number]. Topic: Quick Communication - Rx Refill/Question >> Jul 22, 2017  4:23 PM Cipriano BunkerLambe, Annette S wrote: Medication:  Test strips and finger prick thing (she does not know what they are called)  Has enough until Thursday  Has the patient contacted their pharmacy? Yes.    (Agent: If no, request that the patient contact the pharmacy for the refill.) Preferred Pharmacy (with phone number or street name):   [id number] [address], [place] - [id number] [facility] [address] [facility] [address] [place] [address] Phone: [phone] Fax: [phone]   Agent: Please be advised that RX refills may take up to 3 business days. We ask that you follow-up with your pharmacy.

## 2017-07-22 NOTE — Assessment & Plan Note (Signed)
Taking lipitor 80 mg daily without side effects since leaving the hospital.

## 2017-07-22 NOTE — Telephone Encounter (Signed)
Pt requesting rx for test strips for meter(One Touch 2), lancets and needles to go on the end on Insulin Glargine-Lixisenatide Debra [name](Soliqua).   LOV: 07/21/17  Dr. Dorie Rankrawford  [facility]   [address]

## 2017-07-22 NOTE — Assessment & Plan Note (Signed)
HgA1c better than previous at hospital. Will combine lantus and glp-1 to soliqua and start at 15 units and titrate to max 25 units and then see her back. Continue glipizide for now. She is monitoring morning sugars. At follow up will add random post meal sugar and titrate as needed.

## 2017-07-22 NOTE — Assessment & Plan Note (Signed)
BP is better controlled than usual on her current amlodipine and losartan. Reassurance given that losartan is safe if hers was not recalled. No BMP today as recent from the hospital fine.

## 2017-07-22 NOTE — Assessment & Plan Note (Signed)
Needs home health for PT/OT/SLP evaluation and treatment which is placed. She is still adapting to being home. Her employment does require usage of her speech and it is vitally important to be aggressive with therapy early to help her make a full recovery.

## 2017-07-22 NOTE — Assessment & Plan Note (Signed)
She was not given clear instructions on meds when leaving the hospital and did not stop estrogen oral pills and have asked her to stop today and she agrees. She will call gyn if hot flashes return for alternative treatment.

## 2017-07-23 MED ORDER — GLUCOSE BLOOD VI STRP: ORAL_STRIP | 3 refills | Status: DC

## 2017-07-23 MED ORDER — LANCETS MISC: 3 refills | Status: DC

## 2017-07-23 MED ORDER — INSULIN PEN NEEDLE 31G X 6 MM MISC: 0 refills | Status: DC

## 2017-07-23 NOTE — Telephone Encounter (Signed)
Sent in to pharmacy.  

## 2017-07-24 ENCOUNTER — Ambulatory Visit: Payer: BC Managed Care – PPO | Admitting: Internal Medicine

## 2017-07-24 NOTE — Telephone Encounter (Signed)
Contacted pharmacy and informed them to fill the correct test strips that are needed

## 2017-07-24 NOTE — Telephone Encounter (Signed)
Pharmacy said prescription sent for test strips were not for her One Touch.  She needs the ones that go for her One Touch.   [facility] [id number] [address]- [place], [place] - [address] AT [facility] [address] [facility]  [address] [place] [id number]  Phone: [phone] Fax: [phone]

## 2017-07-24 NOTE — Telephone Encounter (Signed)
See request from pharmacy re: incorrect diabetic test strips ordered. Thank you.

## 2017-07-25 ENCOUNTER — Telehealth: Payer: Self-pay | Admitting: Internal Medicine

## 2017-07-25 NOTE — Telephone Encounter (Signed)
Verbals given as well as MD response  

## 2017-07-25 NOTE — Telephone Encounter (Signed)
Copied from CRM [id number]. Topic: General - Other >> Jul 25, 2017 12:35 PM Gerrianne ScalePayne, Winifred Balogh L wrote: Reason for CRM: Beth from [facility] [phone] calling for verbal orders for pt for speech therapy  Once a week for four weeks   She also wanted the provider to know that it had poped up on her computer that it was a conflict with the patients ASA and

## 2017-07-25 NOTE — Telephone Encounter (Signed)
Please advise per Dr. Crawford's absence. Thank you  

## 2017-07-25 NOTE — Telephone Encounter (Signed)
Okay to give verbal orders.  She is on double anticoagulation with Brilinta and aspirin-looks like she recently had a stent placed by interventional radiology and needs double anticoagulation.

## 2017-07-28 ENCOUNTER — Telehealth: Payer: Self-pay | Admitting: Internal Medicine

## 2017-07-28 NOTE — Telephone Encounter (Signed)
Copied from CRM [phone]. Topic: General - Other >> Jul 28, 2017 12:14 PM Elliot GaultBell, Darlynn M wrote: Jethro Bolusaller name: Stark Kleinatiana Jones Relation to pt: PT from [facility]  Call back number: [phone]    Reason for call:  Requesting verbal orders for PT 2x 4 effective 07/27/17, please advise

## 2017-07-28 NOTE — Telephone Encounter (Signed)
Fine

## 2017-07-28 NOTE — Telephone Encounter (Signed)
Verbals given  

## 2017-08-04 ENCOUNTER — Other Ambulatory Visit (HOSPITAL_COMMUNITY): Payer: Self-pay | Admitting: Interventional Radiology

## 2017-08-04 DIAGNOSIS — I639 Cerebral infarction, unspecified: Secondary | ICD-10-CM

## 2017-08-13 ENCOUNTER — Telehealth: Payer: Self-pay | Admitting: Student

## 2017-08-13 ENCOUNTER — Ambulatory Visit (HOSPITAL_COMMUNITY)
Admission: RE | Admit: 2017-08-13 | Discharge: 2017-08-13 | Disposition: A | Discharge status: Home / Self Care | Payer: BC Managed Care – PPO | Source: Ambulatory Visit | Attending: Interventional Radiology | Admitting: Interventional Radiology

## 2017-08-13 DIAGNOSIS — Z7982 Long term (current) use of aspirin: Secondary | ICD-10-CM | POA: Insufficient documentation

## 2017-08-13 DIAGNOSIS — Z794 Long term (current) use of insulin: Secondary | ICD-10-CM | POA: Insufficient documentation

## 2017-08-13 DIAGNOSIS — Z79899 Other long term (current) drug therapy: Secondary | ICD-10-CM | POA: Diagnosis not present

## 2017-08-13 DIAGNOSIS — I639 Cerebral infarction, unspecified: Secondary | ICD-10-CM

## 2017-08-13 HISTORY — PX: IR RADIOLOGIST EVAL & MGMT: IMG5224

## 2017-08-13 LAB — PLATELET INHIBITION P2Y12: PLATELET FUNCTION P2Y12: 4 [PRU] — AB (ref 194–418)

## 2017-08-13 NOTE — Consult Note (Signed)
Chief Complaint: Patient was seen in consultation today for left MCA proximal inferior division stenosis s/p revascularization and right MCA distal M1 segment stenosis.  Supervising Physician: Luanne Bras  Patient Status: [facility] - Out-pt  History of Present Illness: Debra [name] is a 42 y.o. female  Hx CVA 06/2017, hypertension, diabetes mellitus, morbid obesity, and asthma. Known to NIR, followed by Dr. Estanislado Pandy.  Cerebral angiogram 07/04/2017: 1. Severe tandem 75% plus stenosis of the dominant large inferior division of the left middle cerebral artery proximally. 2. Approximately 80% stenosis of the right middle cerebral artery distal M1 segment.  Hx left MCA proximal inferior division stenosis s/p revascularization with stent assisted angioplasty 07/07/2017 with Dr. Estanislado Pandy.  Patient presents for follow-up regarding her left MCA proximal inferior division stenosis s/p revascularization 07/07/2017 and right MCA distal M1 segment stenosis. Accompanied by mother. Complains of speech difficulty which has significantly improved since procedure 07/07/2017 with the help of regular speech therapy. Complains of dizziness which has improved since procedure 07/07/2017. Complains of right-sided weakness which has significantly improved since procedure 07/07/2017 with the help of regular PT. Complains of easy bruising since procedure. Denies headache, syncope, numbness/tingling, vision changes, hearing changes, or tinnitus.  Patient is taking Brilinta 90 mg twice daily and Aspirin 325 mg once daily.  Past Medical History:  Diagnosis Date  . Allergic rhinitis, cause unspecified   . Asthma   . Diabetes mellitus type II 08/2009 dx  . Endometriosis   . Hypertension   . Knee pain, right    DJD, post traumatic with repeat falls  . Morbid obesity ([place])    s/p lab band 09/2010  . Ovarian cyst    right s/p resection June 2013    Past Surgical History:  Procedure Laterality Date  .  ABDOMINAL HYSTERECTOMY  05/14/12   [place]  . IR ANGIO INTRA EXTRACRAN SEL COM CAROTID INNOMINATE BILAT MOD SED  07/04/2017  . IR ANGIO VERTEBRAL SEL VERTEBRAL BILAT MOD SED  07/04/2017  . IR INTRA CRAN STENT  07/07/2017  . LAPAROSCOPIC GASTRIC BANDING  10/02/2010   start weight 349#  . RADIOLOGY WITH ANESTHESIA N/A 07/07/2017   Procedure: STENTING;  Surgeon: Luanne Bras, MD;  Location: [facility];  Service: Radiology;  Laterality: N/A;  . SALPINGOOPHORECTOMY  10/08/11   right    Allergies: Influenza vaccines; Metformin and related; Morphine; and Augmentin [amoxicillin-pot clavulanate]  Medications: Prior to Admission medications   Medication Sig Start Date End Date Taking? Authorizing Provider  albuterol (PROAIR HFA) 108 (90 BASE) MCG/ACT inhaler Inhale 2 puffs into the lungs every 4 (four) hours as needed for wheezing or shortness of breath. 04/18/15   Rowe Clack, MD  albuterol (PROVENTIL) (2.5 MG/3ML) 0.083% nebulizer solution Take 2.5 mg by nebulization every 6 (six) hours as needed for wheezing or shortness of breath.    [provider]  amLODipine (NORVASC) 10 MG tablet Take 1 tablet (10 mg total) by mouth daily. 07/09/17   Garvin Fila, MD  aspirin 325 MG tablet Take 1 tablet (325 mg total) by mouth daily. Patient not taking: Reported on 07/21/2017 07/09/17   Garvin Fila, MD  atorvastatin (LIPITOR) 80 MG tablet Take 1 tablet (80 mg total) by mouth daily at 6 PM. 07/09/17   Garvin Fila, MD  blood glucose meter kit and supplies Dispense based on patient and insurance preference. Use up to four times daily as directed. (FOR ICD-9 250.00, 250.01). 06/03/14   Rowe Clack, MD  dextromethorphan-guaiFENesin (MUCINEX DM) 30-600 MG 12hr tablet Take 1 tablet by mouth 2 (two) times daily as needed for cough. 02/18/17   Nche, Charlene Brooke, NP  fexofenadine (ALLEGRA) 180 MG tablet Take 180 mg by mouth daily as needed. For seasonal allergies    [provider]  fluticasone (FLONASE) 50 MCG/ACT nasal spray Place 2 sprays into both nostrils daily. 09/23/16   Kordsmeier, Gregary Signs, FNP  glipiZIDE (GLUCOTROL) 5 MG tablet TAKE 1 TABLET(5 MG) BY MOUTH TWICE DAILY BEFORE A MEAL 05/15/17   Hoyt Koch, MD  glucose blood (ONE TOUCH ULTRA TEST) test strip Use as directed 10/28/13   Rowe Clack, MD  glucose blood test strip Used to check blood sugars up to four times daily as needed 07/23/17   Hoyt Koch, MD  Insulin Glargine-Lixisenatide (SOLIQUA) 100-33 UNT-MCG/ML SOPN Inject 15-25 Units into the skin daily. 07/21/17   Hoyt Koch, MD  Insulin Pen Needle 31G X 6 MM MISC Use to inject insulin 07/23/17   Hoyt Koch, MD  Lancets MISC Used to check blood sugars up to four times daily as needed 07/23/17   Hoyt Koch, MD  losartan (COZAAR) 100 MG tablet TAKE 1 TABLET BY MOUTH DAILY 06/02/17   Hoyt Koch, MD  mometasone (NASONEX) 50 MCG/ACT nasal spray Place 2 sprays into the nose daily as needed. For nasal spray    [provider]  norethindrone (AYGESTIN) 5 MG tablet  07/18/17   [provider]  potassium chloride 20 MEQ/15ML (10%) SOLN Take 30 mLs (40 mEq total) by mouth 2 (two) times daily. 07/09/17   Garvin Fila, MD  ticagrelor (BRILINTA) 90 MG TABS tablet Take 1 tablet (90 mg total) by mouth 2 (two) times daily. 07/09/17   Garvin Fila, MD     Family History  Problem Relation Age of Onset  . Heart disease Mother   . Hypertension Mother   . Diabetes Mother   . Stroke Father 24  . Diabetes Father     Social History   Socioeconomic History  . Marital status: Single    Spouse name: n/a  . Number of children: 0  . Years of education: 12+  . Highest education level: Not on file  Occupational History  . Occupation: school bus driver  Social Needs  . Financial resource strain: Not on file  . Food insecurity:    Worry: Not on file    Inability: Not on file  .  Transportation needs:    Medical: Not on file    Non-medical: Not on file  Tobacco Use  . Smoking status: Never Smoker  . Smokeless tobacco: Never Used  Substance and Sexual Activity  . Alcohol use: No    Alcohol/week: 0.0 oz  . Drug use: No  . Sexual activity: Not on file  Lifestyle  . Physical activity:    Days per week: Not on file    Minutes per session: Not on file  . Stress: Not on file  Relationships  . Social connections:    Talks on phone: Not on file    Gets together: Not on file    Attends religious service: Not on file    Active member of club or organization: Not on file    Attends meetings of clubs or organizations: Not on file    Relationship status: Not on file  Other Topics Concern  . Not on file  Social History Narrative   Single, lives with  mom.    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   Completed some college.     Review of Systems: A 12 point ROS discussed and pertinent positives are indicated in the HPI above.  All other systems are negative.  Review of Systems  Constitutional: Negative for activity change and fever.  HENT: Negative for hearing loss and tinnitus.   Eyes: Negative for visual disturbance.  Respiratory: Negative for shortness of breath and wheezing.   Cardiovascular: Negative for chest pain and palpitations.  Neurological: Positive for dizziness, speech difficulty and weakness. Negative for syncope, numbness and headaches.  Hematological: Bruises/bleeds easily.  Psychiatric/Behavioral: Negative for behavioral problems and confusion.    Vital Signs: LMP 07/28/2011   Physical Exam  Constitutional: She is oriented to person, place, and time. She appears well-developed and well-nourished. No distress.  Neurological: She is alert and oriented to person, place, and time.  Psychiatric: She has a normal mood and affect. Her behavior is normal. Judgment and  thought content normal.     Imaging: No results found.  Labs:  CBC: Recent Labs    07/06/17 1201 07/07/17 0443 07/08/17 0607 07/09/17 0606  WBC 9.8 9.2 9.9 9.3  HGB 12.5 11.8* 11.0* 11.1*  HCT 39.1 37.2 34.6* 34.8*  PLT 284 239 247 267    COAGS: Recent Labs    06/30/17 2325 07/07/17 0443 07/07/17 1957  INR 1.06 1.06  --   APTT 27  --  72*    BMP: Recent Labs    07/05/17 0349 07/06/17 1201 07/08/17 0607 07/09/17 0606  NA 139 135 137 137  K 3.4* 3.7 3.2* 3.5  CL 108 108 107 105  CO2 19* 20* 20* 23  GLUCOSE 205* 315* 236* 177*  BUN <5* 7 <5* <5*  CALCIUM 8.2* 7.7* 7.6* 8.2*  CREATININE 0.69 0.78 0.70 0.67  GFRNONAA >60 >60 >60 >60  GFRAA >60 >60 >60 >60    LIVER FUNCTION TESTS: Recent Labs    06/30/17 2325  BILITOT 0.4  AST 16  ALT 18  ALKPHOS 60  PROT 7.1  ALBUMIN 3.6    TUMOR MARKERS: No results for input(s): AFPTM, CEA, CA199, CHROMGRNA in the last 8760 hours.  Assessment and Plan:  Left MCA proximal inferior division stenosis s/p revascularization 07/07/2017. Right MCA distal M1 segment stenosis. Imaging reviewed with patient and mother.  Discussed patient's diabetes control and weight loss. Patient states that she is on a new medication regimen for her diabetes and her sugars have been running from 130-200. States she has lost 15 pounds since discharge and is continuing to lose weight. Reminded patient of the importance of weight loss and keeping her diabetes under control, as her right MCA stenosis could improve from this. Instructed patient to continue following up with her PCP for diabetes management.  Informed patient that the next best course of management at this time is with continued medical therapy and routine imaging scans.  Plan for follow-up with CTA head in 3 months. Informed patient that our schedulers will call her to set up this imaging study. Will order P2Y12 today due to patient's complaint of easy bruising. Instructed  patient to continue taking Brilinta 90 mg twice daily. Instructed patient to discontinue taking Aspirin 325 mg. Instructed patient to begin taking Aspirin 81 mg once daily. Instructed patient to continue following up with PT and speech therapy.  All questions answered and concerns addressed. Patient  and mother convey understanding and agree with plan.  Thank you for this interesting consult.  I greatly enjoyed meeting HENLEE DONOVAN and look forward to participating in their care.  A copy of this report was sent to the requesting provider on this date.  Electronically Signed: Earley Abide, PA-C 08/13/2017, 2:52 PM   I spent a total of 30 minutes in face to face in clinical consultation, greater than 50% of which was counseling/coordinating care for left MCA proximal inferior division stenosis s/p revascularization and right MCA distal M1 segment stenosis.

## 2017-08-13 NOTE — Telephone Encounter (Signed)
Received patient's P2Y12 lab result (4 PRU) from today. Hx left MCA proximal inferior division stenosis s/p revascularization 07/07/2017 with Dr. Corliss Skainseveshwar and right MCA distal M1 segment stenosis. Patient is currently taking Brilinta 90 mg twice daily and Aspirin 81 mg once daily.  Informed patient of P2Y12 lab results. Informed patient, per Dr. Corliss Skainseveshwar, that we are going to alter her medication dosing based on this lab value. Instructed patient to discontinue taking Brilinta 90 mg twice daily. Instructed patient to begin taking Brilinta 45 mg twice daily. Instructed patient to continue taking Aspirin 81 mg once daily.  All questions answered and concerns addressed. Patient conveys understanding and agrees with plan.  Waylan Bogalexandra M Eilis Chestnutt, PA-C 08/13/2017, 3:58 PM

## 2017-08-14 ENCOUNTER — Encounter (HOSPITAL_COMMUNITY): Payer: Self-pay | Admitting: Student

## 2017-08-18 ENCOUNTER — Encounter: Payer: Self-pay | Admitting: Neurology

## 2017-08-18 ENCOUNTER — Ambulatory Visit: Payer: BC Managed Care – PPO | Admitting: Neurology

## 2017-08-18 VITALS — BP 135/79 | HR 80 | Ht 68.0 in | Wt 324.4 lb

## 2017-08-18 DIAGNOSIS — I10 Essential (primary) hypertension: Secondary | ICD-10-CM | POA: Diagnosis not present

## 2017-08-18 DIAGNOSIS — Z794 Long term (current) use of insulin: Secondary | ICD-10-CM

## 2017-08-18 DIAGNOSIS — I63512 Cerebral infarction due to unspecified occlusion or stenosis of left middle cerebral artery: Secondary | ICD-10-CM | POA: Diagnosis not present

## 2017-08-18 DIAGNOSIS — E1159 Type 2 diabetes mellitus with other circulatory complications: Secondary | ICD-10-CM | POA: Diagnosis not present

## 2017-08-18 DIAGNOSIS — E785 Hyperlipidemia, unspecified: Secondary | ICD-10-CM

## 2017-08-18 DIAGNOSIS — I6603 Occlusion and stenosis of bilateral middle cerebral arteries: Secondary | ICD-10-CM

## 2017-08-18 NOTE — Patient Instructions (Addendum)
-   continue ASA and brilinta for stroke prevention and after stent  - continue lipitor  - check BP and glucose and record - continue work with PT and speech - No restriction for driving, but recommend to drive with family members on board for 2-3 times initially. If both parties feel comfortable of your driving, you can drive alone after. However, you are recommended to drive during the day not at night, no long distance and drive in familiar roads. - continue to follow up with Dr. Corliss Skains as scheduled and to determine the duration of ASA and brilinta.  - diabetic diet and regular exercise. Lose weight - Follow up with your primary care physician for stroke risk factor modification. Recommend maintain blood pressure goal <130/80, diabetes with hemoglobin A1c goal below 7.0% and lipids with LDL cholesterol goal below 70 mg/dL.  - follow up in 3 months with Jessicca.

## 2017-08-18 NOTE — Progress Notes (Signed)
STROKE NEUROLOGY FOLLOW UP NOTE  NAME: Debra [name] DOB: 1976-01-27  REASON FOR VISIT: stroke follow up HISTORY FROM: pt and chart  Today we had Debra pleasure of seeing Debra [name] in follow-up at our [facility]rology [facility]. Pt was accompanied by mom.   History Summary Debra [name] is a 42 y.o. female with history of HTN, type 2 DM, morbid obesity, endometriosis/ovarian cyst s/p hysterectomy/SBO and seasonal allergies admitted on 06/30/17 for slurred speech and expressive aphasia.  CT head showed small cortical hypoattenuation within Debra left parietal occipital junction possible acute infarct. CTA head and neck on admission multiple anterior and posterior stenosis, and MRI showed a cluster of acute infarct posterior left temporal lobe and temporoparietal junction region in Debra left MCA territory.  After admission, [name] had fluctuating left MCA syndrome especially with positional changes and BP changes.  Repeat CT showed worsening stable left medial occipital lobe infarct.  Repeat CT head and neck showed unchanged bilateral high-grade M2 stenosis.  CT perfusion with small perfusion deficit, not amendable to intervention.  Repeat MRI showed patchy acute infarct in Debra left posterior MCA and watershed distributions.  Underwent cerebral angiogram on 07/04/2017, found to have left MCA inferior division >75% stenosis and right M1 80% stenosis, no vasculitis but atherosclerosis.  At end of procedure [name] had a vasovagal event while holding pressure at Debra right groin.  BP dropped and Debra [name] again had left MCA syndrome.  Debra [name] was given IV bolus, put into Trendelenburg position and treated with pressors.  BP improved and symptoms resolved.  Debra [name] was admitted in ICU after procedure, continues to have mild expressive aphasia but motor function at baseline.  LDL 129, and A1c 8.5.  UDS negative.  Hypercoagulable work-up negative.  Debra [name] received left MCA stenting on 07/07/2017.  Her symptoms improved and was  discharged home on 07/08/2017 with home health PT/speech.  Home medication including aspirin, Brilinta and Lipitor 80.  Interval History During Debra interval time, Debra [name] has been doing well. Still has mild expressive aphasia but much improve. Debra [name] still has HH speech and PT. Debra [name] followed with Dr. Corliss Skains on 08/14/17 and P2Y12 was low. So her brilinta was decreased in half.  Next visit in 3 months. Continued on ASA 81 and lipitor. Debra [name] stated that her BP and glucose controlled well at home. Today BP 135/79.   REVIEW OF SYSTEMS: Full 14 system review of systems performed and notable only for those listed below and in HPI above, all others are negative:  Constitutional:   Cardiovascular:  Ear/Nose/Throat:   Skin:  Eyes:   Respiratory:   Gastroitestinal:   Genitourinary:  Hematology/Lymphatic:  Easy bruising  Endocrine:  Musculoskeletal:   Allergy/Immunology:  Allergies  Neurological:   Psychiatric:  Sleep:   Debra following represents Debra [name]'s updated allergies and side effects list: Allergies  Allergen Reactions  . Influenza Vaccines   . Metformin And Related Diarrhea  . Morphine Hives  . Augmentin [Amoxicillin-Pot Clavulanate] Rash    To bilat post hands only    Debra neurologically relevant items on Debra [name]'s problem list were reviewed on today's visit.  Neurologic Examination  A problem focused neurological exam (12 or more points of Debra single system neurologic examination, vital signs counts as 1 point, cranial nerves count for 8 points) was performed.  Blood pressure 135/79, pulse 80, height  (1.727 m), weight (!) 324 lb 6.4 oz (147.1 kg), last menstrual period 07/28/2011.  General - Morbid obesity, well developed,  in no apparent distress.  Ophthalmologic - Sharp disc margins OU.   Cardiovascular - Regular rate and rhythm.  Mental Status -  Level of arousal and orientation to time, place, and person were intact. Language including naming, repetition,  comprehension was assessed and found intact. However, Debra [name] still has mild expressive aphasia with occasional paraphasic errors.  Attention span and concentration were normal. Fund of Knowledge was assessed and was intact.  Cranial Nerves II - XII - II - Visual field intact OU. III, IV, VI - Extraocular movements intact. V - Facial sensation intact bilaterally. VII - Facial movement intact bilaterally. VIII - Hearing & vestibular intact bilaterally. X - Palate elevates symmetrically. XI - Chin turning & shoulder shrug intact bilaterally. XII - Tongue protrusion intact.  Motor Strength - Debra [name]'s strength was normal in all extremities and pronator drift was absent.  Bulk was normal and fasciculations were absent   Motor Tone - Muscle tone was assessed at Debra neck and appendages and was normal.  Reflexes - Debra [name]'s reflexes were 1+ in all extremities and Debra [name] had no pathological reflexes.  Sensory - Light touch, temperature/pinprick, vibration and proprioception, and Romberg testing were assessed and were normal.    Coordination - Debra [name] had normal movements in Debra hands and feet with no ataxia or dysmetria.  Tremor was absent.  Gait and Station - Debra [name]'s transfers, posture, gait, station, and turns were observed as normal.   Functional score  mRS = 1   0 - No symptoms.   1 - No significant disability. Able to carry out all usual activities, despite some symptoms.   2 - Slight disability. Able to look after own affairs without assistance, but unable to carry out all previous activities.   3 - Moderate disability. Requires some help, but able to walk unassisted.   4 - Moderately severe disability. Unable to attend to own bodily needs without assistance, and unable to walk unassisted.   5 - Severe disability. Requires constant nursing care and attention, bedridden, incontinent.   6 - Dead.   NIH Stroke Scale  Level Of Consciousness 0=Alert; keenly  responsive 1=Not alert, but arousable by minor stimulation 2=Not alert, requires repeated stimulation 3=Responds only with reflex movements 0  LOC Questions to Month and Age 57=Answers both questions correctly 1=Answers one question correctly 2=Answers neither question correctly 0  LOC Commands      -Open/Close eyes     -Open/close grip 0=Performs both tasks correctly 1=Performs one task correctly 2=Performs neighter task correctly 0  Best Gaze 0=Normal 1=Partial gaze palsy 2=Forced deviation, or total gaze paresis 0  Visual 0=No visual loss 1=Partial hemianopia 2=Complete hemianopia 3=Bilateral hemianopia (blind including cortical blindness) 0  Facial Palsy 0=Normal symmetrical movement 1=Minor paralysis (asymmetry) 2=Partial paralysis (lower face) 3=Complete paralysis (upper and lower face) 0  Motor  0=No drift, limb holds posture for full 10 seconds 1=Drift, limb holds posture, no drift to bed 2=Some antigravity effort, cannot maintain posture, drifts to bed 3=No effort against gravity, limb falls 4=No movement Right Arm 0     Leg 0    Left Arm 0     Leg 0  Limb Ataxia 0=Absent 1=Present in one limb 2=Present in two limbs 0  Sensory 0=Normal 1=Mild to moderate sensory loss 2=Severe to total sensory loss 0  Best Language 0=No aphasia, normal 1=Mild to moderate aphasia 2=Mute, global aphasia 3=Mute, global aphasia 1  Dysarthria 0=Normal 1=Mild to moderate 2=Severe, unintelligible or mute/anarthric 0  Extinction/Neglect  0=No abnormality 1=Extinction to bilateral simultaneous stimulation 2=Profound neglect 0  Total   1     Data reviewed: I personally reviewed Debra images and agree with Debra radiology interpretations.  Cerebral Angiogram  07/04/2017 IMPRESSION: Severe tandem 75% plus stenosis of Debra dominant large inferior division of Debra left middle cerebral artery proximally. Approximately 80% stenosis of Debra right middle cerebral artery distal M1  segment. PLAN: At Debra end of Debra diagnostic procedure whilst pressure was being held in Debra right groin puncture site for hemostasis, Debra [name] started complaining of nausea and feeling hot. Debra [name] was slightly diaphoretic. It was noted that her heart rate had decreased to Debra 60s, and blood pressure was about 104 systolic. During this time it was also noted that Debra [name] had complete expressive aphasia and right-sided arm and leg weakness with a right facial droop. Immediately, Debra [name] was given a liter of normal saline as a fluid bolus. Debra [name] was also placed in Trendelenburg position. Her blood pressure gradually improved to Debra 150s and 170s over Debra 100s diastolic. There was a gradual return of neurological function starting with Debra right lower extremity, Debra right upper extremity and eventually improved speech expression. Debra facial droop also receded. A code stroke was called for evaluation. It was agreed that this neurological deficit was a transient ischemic episode related to hypoperfusion due to Debra high-grade stenosis of her left middle cerebral artery as shown on Debra recent diagnostic arteriogram. Debra [name] was then transported to Debra neuro ICU to continue with IV hydration, close neurologic observations, and also with vasopressor support in Debra form of Neo-Synephrine should that be needed. This was explained to Debra [name]'s husband and also to Debra [name] as Debra [name] recovered.   Ct Head Code Stroke Wo Contrast 06/30/2017 2329 1. Small 14 mm cortical focus of hypoattenuation within left parieto-occipital junction which may represent acute or subacute infarction.No hemorrhage or mass effect.  2. ASPECTS is 10    Ct Angio Head W Or Wo Contrast Ct Angio Neck W Or Wo Contrast 07/01/2017 1. Multiple segments of arterial stenosis in Debra anterior and posterior circulation which may represent cerebral vasculitis or possibly reversible cerebral  vasoconstriction syndrome (RCVS)/PRES given posterior distribution cortical abnormality.  2. No large vessel occlusion, aneurysm, or vascular malformation.  3. Widely patent carotid and vertebral arteries in Debra neck.    Ct Head Wo Contrast 07/01/2017 Stable infarct in Debra medial left occipital lobe.No new area of decreased attenuation evident by noncontrast enhanced CT. Note Debra noncontrast enhanced CT from 1 day prior as well as Debra current examination demonstrates less extensive infarct than is demonstrated on MR obtained earlier in Debra day. No hemorrhage or mass evident.    Mr Brain Wo Contrast 07/01/2017 0736 Cluster of acute infarctions in Debra posterior left temporal lobe and temporoparietal junction region consistent with embolic infarctions in Debra left MCA territory. No other abnormal brain parenchymal finding. Whereas this could represent an extremely localized manifestation of posterior reversible encephalopathy, I think that is less likely.    Ct Angio Head W Or Wo Contrast 07/02/2017 1. Persistent segments of high-grade stenosis and right proximal M2 superior division and left proximal M2 inferior divisionas well as segments of mild stenosis and bilateral PCA.  2. No new large vessel occlusion or aneurysm of Debra intracranial circulation.    Ct Angio Neck W Or Wo Contrast 07/02/2017 Patent carotid and vertebral arteries. No dissection, aneurysm, or hemodynamically significant stenosis utilizing NASCET criteria.  Ct Cerebral Perfusion W Contrast 07/02/2017 1. No region of CBF (<30%) to indicate new acute infarction.  2. Areas of infarction of left posterior MCA distribution have pseudonormalized perfusion.  3. Region of T-max >6 seconds (8 cc) in adjacent left parietal lobe indicating penumbra.  4. Larger region of T-max >4 seconds (57 cc) extending into left posterior frontal lobe compatible with oligemia.    Mr Brain Wo Contrast 07/03/2017   IMPRESSION: Patchy acute infarct in Debra left posterior MCA and watershed distributions,moderate to large in total extent.   Transthoracic Echocardiogram  07/03/2017 Study Conclusions - Left ventricle: Debra cavity size was normal. Systolic function was normal. Debra estimated ejection fraction was in Debra range of 60% to 65%.  Wall motion was normal; there were no regional wall motion abnormalities. Impressions: - No cardiac source of emboli was indentified.   Cerebral Angiogram - Dr Corliss Skains 07/07/2017 S/P lt common carotid arteriogram followed by stenting of tandem high grade stenosis of large inf division of Lt MCA With significantly improved caliber and flow  Component     Latest Ref Rng & Units 07/01/2017 07/02/2017  Cholesterol     0 - 200 mg/dL 161   Triglycerides     <150 mg/dL 60   HDL Cholesterol     >40 mg/dL 27 (L)   Total CHOL/HDL Ratio     RATIO 6.2   VLDL     0 - 40 mg/dL 12   LDL (calc)     0 - 99 mg/dL 096 (H)   Ribonucleic Protein     0.0 - 0.9 AI <0.2   SSA (Ro) (ENA) Antibody, IgG     0.0 - 0.9 AI <0.2   Scleroderma (Scl-70) (ENA) Antibody, IgG     0.0 - 0.9 AI <0.2   ENA SM Ab Ser-aCnc     0.0 - 0.9 AI <0.2   SSB (La) (ENA) Antibody, IgG     0.0 - 0.9 AI <0.2   ds DNA Ab     0 - 9 IU/mL 4   Opiates     NONE DETECTED  NONE DETECTED  COCAINE     NONE DETECTED  NONE DETECTED  Benzodiazepines     NONE DETECTED  NONE DETECTED  Amphetamines     NONE DETECTED  NONE DETECTED  Tetrahydrocannabinol     NONE DETECTED  NONE DETECTED  Barbiturates     NONE DETECTED  NONE DETECTED  PTT Lupus Anticoagulant     0.0 - 51.9 sec 34.4   DRVVT     0.0 - 47.0 sec 41.8   Lupus Anticoag Interp      Comment:   Beta-2 Glycoprotein I Ab, IgG     0 - 20 GPI IgG units <9   Beta-2-Glycoprotein I IgM     0 - 32 GPI IgM units <9   Beta-2-Glycoprotein I IgA     0 - 25 GPI IgA units <9   Anticardiolipin Ab,IgG,Qn     0 - 14 GPL U/mL <9   Anticardiolipin  Ab,IgM,Qn     0 - 12 MPL U/mL 15 (H)   Anticardiolipin Ab,IgA,Qn     0 - 11 APL U/mL <9   Cytoplasmic (C-ANCA)     Neg:<1:20 titer <1:20   P-ANCA     Neg:<1:20 titer <1:20   Atypical P-ANCA titer     Neg:<1:20 titer <1:20   Hemoglobin A1C     4.8 - 5.6 % 8.5 (H)   Mean Plasma  Glucose     mg/dL 259.56   Myeloperoxidase Abs     0.0 - 9.0 U/mL <9.0   ANCA Proteinase 3     0.0 - 3.5 U/mL <3.5   HIV Screen 4th Generation wRfx     Non Reactive Non Reactive   Antithrombin Activity     75 - 120 % 85   Protein C-Functional     73 - 180 % 110   Protein C, Total     60 - 150 % 92   Protein S-Functional     63 - 140 % 108   Protein S, Total     60 - 150 % 87   Homocysteine     0.0 - 15.0 umol/L 12.9   Recommendations-F5LEID:      Comment   Recommendations-PTGENE:      Comment   Sed Rate     0 - 22 mm/hr 13   CRP     <1.0 mg/dL 1.0 (H)   Anti JO-1     0.0 - 0.9 AI <0.2   RA Latex Turbid.     0.0 - 13.9 IU/mL 10.5    Component     Latest Ref Rng & Units 07/05/2017 07/07/2017 08/13/2017  Platelet Function  P2Y12     194 - 418 PRU 182 (L) 197 4 (L)     Assessment: As you may recall, Debra [name] is a 42 y.o. African American female with PMH of HTN, type 2 DM, morbid obesity, endometriosis/ovarian cyst s/p hysterectomy/SBO and seasonal allergies admitted on 06/30/17 for slurred speech and expressive aphasia.  CT head showed small cortical hypoattenuation within Debra left parietal occipital junction possible acute infarct. CTA head and neck on admission multiple anterior and posterior stenosis, and MRI showed a cluster of acute infarct posterior left temporal lobe and temporoparietal junction region in Debra left MCA territory.  After admission, [name] had fluctuating left MCA syndrome especially with positional changes and BP changes.  Repeat CT showed worsening stable left medial occipital lobe infarct.  Repeat CT head and neck showed unchanged bilateral high-grade M2 stenosis.  CT perfusion  with small perfusion deficit, not amendable to intervention.  Repeat MRI showed patchy acute infarct in Debra left posterior MCA and watershed distributions.  Underwent cerebral angiogram on 07/04/2017, found to have left MCA inferior division >75% stenosis and right M1 80% stenosis, no vasculitis but atherosclerosis.  Post procedure Debra [name] had again had left MCA syndrome with vasovagal hypotension.  Debra [name] was treated, BP improved but continue to have mild expressive aphasia but motor function resolved.  LDL 129, and A1c 8.5.  UDS negative.  Hypercoagulable work-up negative except slightly elevated cardiolipin IgM.  Debra [name] received left MCA stenting on 07/07/2017.  Discharged on aspirin, Brilinta and Lipitor 80. Debra [name] still has very mild expressive aphasia on [facility] speech and PT. Debra [name] followed with Dr. Corliss Skains on 08/14/17 and P2Y12 was low. So her brilinta was decreased in half.  BP and glucose controlled well at home.    Plan:  - continue ASA 81mg  and brilinta 45mg  bid for stroke prevention and post stent  - continue to follow up with Dr. Corliss Skains as scheduled and to determine Debra duration of ASA and brilinta.  - continue lipitor for stroke prevention - check BP and glucose and record - continue work with PT and speech - No restriction for driving, but recommend to drive with family members on board for 2-3 times initially. If both parties feel comfortable of your driving, you  can drive alone after. However, you are recommended to drive during Debra day not at night, no long distance and drive in familiar roads. - diabetic diet and regular exercise. Lose weight - Follow up with your primary care physician for stroke risk factor modification. Recommend maintain blood pressure goal <130/80, diabetes with hemoglobin A1c goal below 7.0% and lipids with LDL cholesterol goal below 70 mg/dL.  - follow up in 3 months with Jessicca.   I spent more than 25 minutes of face to face time with Debra [name]. Greater than 50% of time  was spent in counseling and coordination of care. We discussed follow up with IR, continue medication as instructed, cautious with driving but no restriction, check BP and glucose at home  No orders of Debra defined types were placed in this encounter.   No orders of Debra defined types were placed in this encounter.   [name] Instructions  - continue ASA and brilinta for stroke prevention and after stent  - continue lipitor  - check BP and glucose and record - continue work with PT and speech - No restriction for driving, but recommend to drive with family members on board for 2-3 times initially. If both parties feel comfortable of your driving, you can drive alone after. However, you are recommended to drive during Debra day not at night, no long distance and drive in familiar roads. - continue to follow up with Dr. Corliss Skains as scheduled and to determine Debra duration of ASA and brilinta.  - diabetic diet and regular exercise. Lose weight - Follow up with your primary care physician for stroke risk factor modification. Recommend maintain blood pressure goal <130/80, diabetes with hemoglobin A1c goal below 7.0% and lipids with LDL cholesterol goal below 70 mg/dL.  - follow up in 3 months with Jessicca.      Marvel Plan, MD PhD [facility], [facility] [address], [address] [place], [place] [address] [phone]

## 2017-08-19 DIAGNOSIS — E119 Type 2 diabetes mellitus without complications: Secondary | ICD-10-CM | POA: Insufficient documentation

## 2017-08-19 DIAGNOSIS — I63512 Cerebral infarction due to unspecified occlusion or stenosis of left middle cerebral artery: Secondary | ICD-10-CM | POA: Insufficient documentation

## 2017-08-21 ENCOUNTER — Telehealth: Payer: Self-pay | Admitting: Neurology

## 2017-08-21 NOTE — Telephone Encounter (Signed)
Rn receive call from patient that she has a Research scientist (physical sciences) for massage therapy. Also she has not been back since she had the stroke. PT stated the massage is for the whole body. She wants to know if that's safe. Rn stated Dr. Roda Shutters is out of the office today,and Friday. Rn stated a recommendation may not be done until  Monday. Pt verbalized understanding.

## 2017-08-21 NOTE — Telephone Encounter (Signed)
Pt called she is wanting to know if she could massage therapy is ok. Please call to advise

## 2017-08-21 NOTE — Telephone Encounter (Signed)
Please let the pt know that generally speaking, massage therapy is not contraindicated for her even she had stroke recently. However, we do have evidence that neck massages associated with higher risk of stroke due to neck manipulations. Therefore, if neck massages is not necessary, that will be great. However, if she has to have neck massages, please be careful and avoid over extension of neck and avoid abrupt maneuver of the neck. Other body area massage would be fine. Thanks.   Marvel Plan, MD PhD Stroke Neurology 08/21/2017 5:13 PM

## 2017-08-21 NOTE — Telephone Encounter (Signed)
Rn call patient to give her Dr Roda Shutters recommendations on massages on neck and body.Rn stated that generally speaking, massage therapy is not contraindicated for her even she had stroke recently. However, we do have evidence that neck massages associated with higher risk of stroke due to neck manipulations. Therefore, if neck massages is not necessary, that will be great. However, if she has to have neck massages, please be careful and avoid over extension of neck and avoid abrupt maneuver of the neck. Other body area massage would be fine. Thanks.   Marvel Plan, MD PhD Stroke Neurology 08/21/2017 5:13 PM  Rn read the above message to patient and she verbalized understanding. Pt stated she would just had the body massage She will not be having the neck massage.

## 2017-08-21 NOTE — Telephone Encounter (Signed)
Left vm for patient to call back on what body parts she is wanting to be approve for massage therapy.

## 2017-09-14 ENCOUNTER — Other Ambulatory Visit: Payer: Self-pay | Admitting: Internal Medicine

## 2017-09-17 NOTE — Telephone Encounter (Signed)
Pt called stating she is needing a note stating she has been cleared to go back to massage therapy.

## 2017-09-17 NOTE — Telephone Encounter (Signed)
Letter done and please mail to pt. Thanks.   Marvel Plan, MD PhD Stroke Neurology 09/17/2017 5:03 PM

## 2017-09-17 NOTE — Telephone Encounter (Signed)
Rn spoke with Dr. Roda Shutters. He can only give recommendations for massage.Note below does not give clearance pt call about massage therapy. See note from Rn on 08/21/2017 at 1742. Pt verbalized understanding of recommendation. Letter for massage therapy put in mail.

## 2017-09-17 NOTE — Telephone Encounter (Signed)
Left vm for patient that letter put in mail for massage therapy recommendations.

## 2017-09-24 ENCOUNTER — Telehealth: Payer: Self-pay | Admitting: Internal Medicine

## 2017-09-24 NOTE — Telephone Encounter (Signed)
MD is out of the office this week. Pt will need to make f./u appt to be re-evaluated for sxs. Pls contact pt and make next available appt w/MD../lmb

## 2017-09-24 NOTE — Telephone Encounter (Signed)
Patient would like to wait and see Dr Okey Duprerawford at her current scheduled time.

## 2017-09-24 NOTE — Telephone Encounter (Signed)
Copied from CRM [id number]. Topic: Quick Communication - See Telephone Encounter >> Sep 24, 2017  3:29 PM Lorrine KinMcGee, Kinsie Belford B, NT wrote: CRM for notification. See Telephone encounter for: 09/24/17. Patient states that she had to come off of her hormone replacement medication due to her stroke 2 months ago. States that she is having trouble sleeping now  due to hot flashes and can't fall asleep. Would like a call to discuss what she could do to take that could help her get back into a normal sleep pattern? CB#: [phone]

## 2017-09-30 ENCOUNTER — Other Ambulatory Visit: Payer: Self-pay | Admitting: Internal Medicine

## 2017-10-02 ENCOUNTER — Ambulatory Visit: Payer: BC Managed Care – PPO | Admitting: Internal Medicine

## 2017-10-02 ENCOUNTER — Encounter: Payer: Self-pay | Admitting: Internal Medicine

## 2017-10-02 ENCOUNTER — Other Ambulatory Visit (INDEPENDENT_AMBULATORY_CARE_PROVIDER_SITE_OTHER): Payer: BC Managed Care – PPO

## 2017-10-02 VITALS — BP 126/78 | HR 68 | Temp 99.0°F | Ht 68.0 in | Wt 324.0 lb

## 2017-10-02 DIAGNOSIS — E1165 Type 2 diabetes mellitus with hyperglycemia: Secondary | ICD-10-CM | POA: Diagnosis not present

## 2017-10-02 DIAGNOSIS — R232 Flushing: Secondary | ICD-10-CM

## 2017-10-02 DIAGNOSIS — Z8673 Personal history of transient ischemic attack (TIA), and cerebral infarction without residual deficits: Secondary | ICD-10-CM | POA: Diagnosis not present

## 2017-10-02 LAB — HEMOGLOBIN A1C: Hgb A1c MFr Bld: 11.2 % — ABNORMAL HIGH (ref 4.6–6.5)

## 2017-10-02 MED ORDER — NYSTATIN-TRIAMCINOLONE 100000-0.1 UNIT/GM-% EX OINT: 1.0000 "application " | TOPICAL_OINTMENT | Freq: Two times a day (BID) | CUTANEOUS | 0 refills | Status: DC

## 2017-10-02 NOTE — Progress Notes (Signed)
   Subjective:    Patient ID: Debra [name], female    DOB: 02/10/1976, 42 y.o.   MRN: [id number]  HPI The patient is a 42 YO female coming in for follow up of her diabetes (taking 25 units soliqua and sugars more around 200 now, sometimes 230 depending on food, taking her other meds as prescribed, no low sugars, no numbness or weakness, complicated by past stroke) and expressive aphasia (better with SLP, not quite back to normal, no new stroke symptoms, speech is more fluid, still some word finding, singing now which is helping) and hot flashes (since stopping hormone replacement she is having hot flashes, more in the evening and night, keeping her from sleeping, she has not tried anything as she was not sure what was safe). Driving independently again without issues.   Review of Systems  Constitutional: Negative.   HENT: Negative.   Eyes: Negative.   Respiratory: Negative for cough, chest tightness and shortness of breath.   Cardiovascular: Negative for chest pain, palpitations and leg swelling.  Gastrointestinal: Negative for abdominal distention, abdominal pain, constipation, diarrhea, nausea and vomiting.  Musculoskeletal: Negative.   Skin: Negative.   Neurological: Negative.   Psychiatric/Behavioral: Negative.       Objective:   Physical Exam  Constitutional: She is oriented to person, place, and time. She appears well-developed and well-nourished.  HENT:  Head: Normocephalic and atraumatic.  Eyes: EOM are normal.  Neck: Normal range of motion.  Cardiovascular: Normal rate and regular rhythm.  Pulmonary/Chest: Effort normal and breath sounds normal. No respiratory distress. She has no wheezes. She has no rales.  Abdominal: Soft. Bowel sounds are normal. She exhibits no distension. There is no tenderness. There is no rebound.  Musculoskeletal: She exhibits no edema.  Neurological: She is alert and oriented to person, place, and time. Coordination normal.  Speech is much more  fluent that past visit  Skin: Skin is warm and dry.  Psychiatric: She has a normal mood and affect.   Vitals:   10/02/17 1303  BP: 126/78  Pulse: 68  Temp: 99 F (37.2 C)  TempSrc: Oral  SpO2: 99%  Weight: (!) 324 lb (147 kg)  Height: 5\' 8"  (1.727 m)      Assessment & Plan:

## 2017-10-02 NOTE — Patient Instructions (Addendum)
We have sent in the cream to use 2 times a day on the rash.   We are checking the labs today. It is okay to go up from 25 units up by 2 units every 4 days up to a maximum of 40 units. Let us know if the sugars are still high at the end. The goal is around 130 fasting in the morning.   Try melatonin or benadryl for sleep.

## 2017-10-04 DIAGNOSIS — R232 Flushing: Secondary | ICD-10-CM | POA: Insufficient documentation

## 2017-10-04 NOTE — Assessment & Plan Note (Signed)
Weight stable since last visit. She is working on becoming more mobile and more stamina since last visit.

## 2017-10-04 NOTE — Assessment & Plan Note (Signed)
Due to stopping hormone replacement. We talked about options such as fans, layers, lexapro. She would like to try melatonin for sleep to see if this can help. Overall symtoms are gradually improving over the last 2 months and we talked about likely course of gradual improvement over the next 1-2 years of these. She will let us know if she wants to try lexapro. Given the stroke on hormones would not be able to try systemic hormones and since regional dryness is not symptoms topical estrogen would not be helpful.

## 2017-10-04 NOTE — Assessment & Plan Note (Signed)
Speech is much improved from prior. SLP and PT/OT were very helpful. Reminded her that neurological symptoms can improve up to 1-2 years after stroke so to continue working for improvement.

## 2017-10-04 NOTE — Assessment & Plan Note (Signed)
Checking HgA1c and fructosamine. Continue to increase soliqua for fasting around 120. Continue glipizide, soliqua. Adjust as needed. Complicated by stroke.

## 2017-10-05 LAB — FRUCTOSAMINE: FRUCTOSAMINE: 338 umol/L — AB (ref 190–270)

## 2017-10-20 ENCOUNTER — Other Ambulatory Visit: Payer: Self-pay | Admitting: Internal Medicine

## 2017-11-07 ENCOUNTER — Other Ambulatory Visit (HOSPITAL_COMMUNITY): Payer: Self-pay | Admitting: Interventional Radiology

## 2017-11-07 DIAGNOSIS — I639 Cerebral infarction, unspecified: Secondary | ICD-10-CM

## 2017-11-14 NOTE — Progress Notes (Signed)
STROKE NEUROLOGY FOLLOW UP NOTE  NAME: Debra [name] DOB: May 04, 1975  REASON FOR VISIT: stroke follow up HISTORY FROM: pt and chart  Today we had the pleasure of seeing Debra [name] in follow-up at our Neurology [facility]. Pt was accompanied by mom.   History Summary Debra [name] is a 42 y.o. female with history of HTN, type 2 DM, morbid obesity, endometriosis/ovarian cyst s/p hysterectomy/SBO and seasonal allergies admitted on 06/30/17 for slurred speech and expressive aphasia.  CT head showed small cortical hypoattenuation within the left parietal occipital junction possible acute infarct. CTA head and neck on admission multiple anterior and posterior stenosis, and MRI showed a cluster of acute infarct posterior left temporal lobe and temporoparietal junction region in the left MCA territory.  After admission, patient had fluctuating left MCA syndrome especially with positional changes and BP changes.  Repeat CT showed worsening stable left medial occipital lobe infarct.  Repeat CT head and neck showed unchanged bilateral high-grade M2 stenosis.  CT perfusion with small perfusion deficit, not amendable to intervention.  Repeat MRI showed patchy acute infarct in the left posterior MCA and watershed distributions.  Underwent cerebral angiogram on 07/04/2017, found to have left MCA inferior division >75% stenosis and right M1 80% stenosis, no vasculitis but atherosclerosis.  At end of procedure patient had a vasovagal event while holding pressure at the right groin.  BP dropped and she again had left MCA syndrome.  She was given IV bolus, put into Trendelenburg position and treated with pressors.  BP improved and symptoms resolved.  She was admitted in ICU after procedure, continues to have mild expressive aphasia but motor function at baseline.  LDL 129, and A1c 8.5.  UDS negative.  Hypercoagulable work-up negative.  She received left MCA stenting on 07/07/2017.  Her symptoms improved and was  discharged home on 07/08/2017 with home health PT/speech.  Home medication including aspirin, Brilinta and Lipitor 80.  08/18/17 visit JX: During the interval time, the patient has been doing well. Still has mild expressive aphasia but much improve. She still has HH speech and PT. She followed with Dr. Corliss Skains on 08/14/17 and P2Y12 was low. So her brilinta was decreased in half.  Next visit in 3 months. Continued on ASA 81 and lipitor. She stated that her BP and glucose controlled well at home. Today BP 135/79.   11/17/17 UPDATE: Patient is being seen today for follow-up and is accompanied by her mother.  She states her speech continues to improve and only has occasional episodes of difficulty getting certain words out with possible stuttering but this occurs more frequently when she is tired, upset or excited.  She does continue to do home speech exercises.  She also has intermittent right arm tingling but this also has become less frequent.  She continues to take both aspirin and Brilinta with bruising but no bleeding.  Continues to take Lipitor but does endorse hip pain that has been new since starting Lipitor but states she did not realize this could be a possible side effect.  Cholesterol levels have not been rechecked since hospital admission.  Blood pressure satisfactory at 143/89.  Patient does continue to monitor this at home.  She also continues to check glucose levels which she states has been improving since her recent adjustment by PCP.  Patient continues to stay active with her church community and attempts to maintain a healthy diet.  She will be having repeat CTA head/neck that was ordered by Dr.  Deveshwar 11/19/2017 for follow-up of stent placement and assessment of right stenosis.  Patient denies new or worsening stroke/TIA symptoms.     REVIEW OF SYSTEMS: Full 14 system review of systems performed and notable only for those listed below and in HPI above, all others are negative:  Food  allergies and bruise easily  The following represents the patient's updated allergies and side effects list: Allergies  Allergen Reactions  . Influenza Vaccines   . Metformin And Related Diarrhea  . Morphine Hives  . Augmentin [Amoxicillin-Pot Clavulanate] Rash    To bilat post hands only    The neurologically relevant items on the patient's problem list were reviewed on today's visit.  Neurologic Examination  A problem focused neurological exam (12 or more points of the single system neurologic examination, vital signs counts as 1 point, cranial nerves count for 8 points) was performed.  Blood pressure (!) 143/89, pulse 72, height 5\' 8"  (1.727 m), weight (!) 329 lb (149.2 kg), last menstrual period 07/28/2011.  General - Morbid obesity, well developed, pleasant middle-aged African-American female, in no apparent distress.  Ophthalmologic - Sharp disc margins OU.   Cardiovascular - Regular rate and rhythm.  Mental Status -  Level of arousal and orientation to time, place, and person were intact. Language including naming, repetition, comprehension was assessed and found intact.  Intermittent mild expressive aphasia Attention span and concentration were normal. Fund of Knowledge was assessed and was intact.  Cranial Nerves II - XII - II - Visual field intact OU. III, IV, VI - Extraocular movements intact. V - Facial sensation intact bilaterally. VII - Facial movement intact bilaterally. VIII - Hearing & vestibular intact bilaterally. X - Palate elevates symmetrically. XI - Chin turning & shoulder shrug intact bilaterally. XII - Tongue protrusion intact.  Motor Strength - The patient's strength was normal in all extremities and pronator drift was absent.  Bulk was normal and fasciculations were absent   Motor Tone - Muscle tone was assessed at the neck and appendages and was normal.  Reflexes - The patient's reflexes were 1+ in all extremities and she had no pathological  reflexes.  Sensory - Light touch, temperature/pinprick, vibration and proprioception, and Romberg testing were assessed and were normal.    Coordination - The patient had normal movements in the hands and feet with no ataxia or dysmetria.  Tremor was absent.  Gait and Station - The patient's transfers, posture, gait, station, and turns were observed as normal.    Data reviewed: I personally reviewed the images and agree with the radiology interpretations.  Cerebral Angiogram  07/04/2017 IMPRESSION: Severe tandem 75% plus stenosis of the dominant large inferior division of the left middle cerebral artery proximally. Approximately 80% stenosis of the right middle cerebral artery distal M1 segment. PLAN: At the end of the diagnostic procedure whilst pressure was being held in the right groin puncture site for hemostasis, the patient started complaining of nausea and feeling hot. She was slightly diaphoretic. It was noted that her heart rate had decreased to the 60s, and blood pressure was about 104 systolic. During this time it was also noted that the patient had complete expressive aphasia and right-sided arm and leg weakness with a right facial droop. Immediately, the patient was given a liter of normal saline as a fluid bolus. The patient was also placed in Trendelenburg position. Her blood pressure gradually improved to the 150s and 170s over the 100s diastolic. There was a gradual return of neurological function starting  with the right lower extremity, the right upper extremity and eventually improved speech expression. The facial droop also receded. A code stroke was called for evaluation. It was agreed that this neurological deficit was a transient ischemic episode related to hypoperfusion due to the high-grade stenosis of her left middle cerebral artery as shown on the recent diagnostic arteriogram. The patient was then transported to the neuro ICU to continue with IV  hydration, close neurologic observations, and also with vasopressor support in the form of Neo-Synephrine should that be needed. This was explained to the patient's husband and also to the patient as she recovered.   Ct Head Code Stroke Wo Contrast 06/30/2017 2329 1. Small 14 mm cortical focus of hypoattenuation within left parieto-occipital junction which may represent acute or subacute infarction.No hemorrhage or mass effect.  2. ASPECTS is 10    Ct Angio Head W Or Wo Contrast Ct Angio Neck W Or Wo Contrast 07/01/2017 1. Multiple segments of arterial stenosis in the anterior and posterior circulation which may represent cerebral vasculitis or possibly reversible cerebral vasoconstriction syndrome (RCVS)/PRES given posterior distribution cortical abnormality.  2. No large vessel occlusion, aneurysm, or vascular malformation.  3. Widely patent carotid and vertebral arteries in the neck.    Ct Head Wo Contrast 07/01/2017 Stable infarct in the medial left occipital lobe.No new area of decreased attenuation evident by noncontrast enhanced CT. Note the noncontrast enhanced CT from 1 day prior as well as the current examination demonstrates less extensive infarct than is demonstrated on MR obtained earlier in the day. No hemorrhage or mass evident.    Mr Brain Wo Contrast 07/01/2017 0736 Cluster of acute infarctions in the posterior left temporal lobe and temporoparietal junction region consistent with embolic infarctions in the left MCA territory. No other abnormal brain parenchymal finding. Whereas this could represent an extremely localized manifestation of posterior reversible encephalopathy, I think that is less likely.    Ct Angio Head W Or Wo Contrast 07/02/2017 1. Persistent segments of high-grade stenosis and right proximal M2 superior division and left proximal M2 inferior divisionas well as segments of mild stenosis and bilateral PCA.  2. No new large vessel  occlusion or aneurysm of the intracranial circulation.    Ct Angio Neck W Or Wo Contrast 07/02/2017 Patent carotid and vertebral arteries. No dissection, aneurysm, or hemodynamically significant stenosis utilizing NASCET criteria.    Ct Cerebral Perfusion W Contrast 07/02/2017 1. No region of CBF (<30%) to indicate new acute infarction.  2. Areas of infarction of left posterior MCA distribution have pseudonormalized perfusion.  3. Region of T-max >6 seconds (8 cc) in adjacent left parietal lobe indicating penumbra.  4. Larger region of T-max >4 seconds (57 cc) extending into left posterior frontal lobe compatible with oligemia.    Mr Brain Wo Contrast 07/03/2017  IMPRESSION: Patchy acute infarct in the left posterior MCA and watershed distributions,moderate to large in total extent.   Transthoracic Echocardiogram  07/03/2017 Study Conclusions - Left ventricle: The cavity size was normal. Systolic function was normal. The estimated ejection fraction was in the range of 60% to 65%.  Wall motion was normal; there were no regional wall motion abnormalities. Impressions: - No cardiac source of emboli was indentified.   Cerebral Angiogram - Dr Corliss Skainseveshwar 07/07/2017 S/P lt common carotid arteriogram followed by stenting of tandem high grade stenosis of large inf division of Lt MCA With significantly improved caliber and flow  Component     Latest Ref Rng & Units 07/01/2017 07/02/2017  Cholesterol     0 - 200 mg/dL 161   Triglycerides     <150 mg/dL 60   HDL Cholesterol     >40 mg/dL 27 (L)   Total CHOL/HDL Ratio     RATIO 6.2   VLDL     0 - 40 mg/dL 12   LDL (calc)     0 - 99 mg/dL 096 (H)   Ribonucleic Protein     0.0 - 0.9 AI <0.2   SSA (Ro) (ENA) Antibody, IgG     0.0 - 0.9 AI <0.2   Scleroderma (Scl-70) (ENA) Antibody, IgG     0.0 - 0.9 AI <0.2   ENA SM Ab Ser-aCnc     0.0 - 0.9 AI <0.2   SSB (La) (ENA) Antibody, IgG     0.0 - 0.9 AI <0.2   ds DNA  Ab     0 - 9 IU/mL 4   Opiates     NONE DETECTED  NONE DETECTED  COCAINE     NONE DETECTED  NONE DETECTED  Benzodiazepines     NONE DETECTED  NONE DETECTED  Amphetamines     NONE DETECTED  NONE DETECTED  Tetrahydrocannabinol     NONE DETECTED  NONE DETECTED  Barbiturates     NONE DETECTED  NONE DETECTED  PTT Lupus Anticoagulant     0.0 - 51.9 sec 34.4   DRVVT     0.0 - 47.0 sec 41.8   Lupus Anticoag Interp      Comment:   Beta-2 Glycoprotein I Ab, IgG     0 - 20 GPI IgG units <9   Beta-2-Glycoprotein I IgM     0 - 32 GPI IgM units <9   Beta-2-Glycoprotein I IgA     0 - 25 GPI IgA units <9   Anticardiolipin Ab,IgG,Qn     0 - 14 GPL U/mL <9   Anticardiolipin Ab,IgM,Qn     0 - 12 MPL U/mL 15 (H)   Anticardiolipin Ab,IgA,Qn     0 - 11 APL U/mL <9   Cytoplasmic (C-ANCA)     Neg:<1:20 titer <1:20   P-ANCA     Neg:<1:20 titer <1:20   Atypical P-ANCA titer     Neg:<1:20 titer <1:20   Hemoglobin A1C     4.8 - 5.6 % 8.5 (H)   Mean Plasma Glucose     mg/dL 045.40   Myeloperoxidase Abs     0.0 - 9.0 U/mL <9.0   ANCA Proteinase 3     0.0 - 3.5 U/mL <3.5   HIV Screen 4th Generation wRfx     Non Reactive Non Reactive   Antithrombin Activity     75 - 120 % 85   Protein C-Functional     73 - 180 % 110   Protein C, Total     60 - 150 % 92   Protein S-Functional     63 - 140 % 108   Protein S, Total     60 - 150 % 87   Homocysteine     0.0 - 15.0 umol/L 12.9   Recommendations-F5LEID:      Comment   Recommendations-PTGENE:      Comment   Sed Rate     0 - 22 mm/hr 13   CRP     <1.0 mg/dL 1.0 (H)   Anti JO-1     0.0 - 0.9 AI <0.2   RA Latex Turbid.     0.0 -  13.9 IU/mL 10.5    Component     Latest Ref Rng & Units 07/05/2017 07/07/2017 08/13/2017  Platelet Function  P2Y12     194 - 418 PRU 182 (L) 197 4 (L)     Assessment: Debra [name] ia a 42 year old female with left MCA territory infarct with left MCA angioplasty and stent placement on 06/30/17 secondary to  large vessel disease. Vascular risk factors include HTN, HLD and DM.  Patient returns today for follow-up visit and overall continues to do well with continued speech improvement.   Plan:  - continue ASA 81mg  and brilinta 45mg  bid for stroke prevention and post stent  - continue to follow up with Dr. Corliss Skains as scheduled with repeat imaging on 11/19/2017 and to determine the duration of ASA and brilinta.  - continue lipitor for stroke prevention -Check lipid panel today and consider decreasing Lipitor dosage or changing statin due to possible side effects of myalgias with Lipitor 80 mg - check BP and glucose and record - continue work with PT and speech -Advised to continue to stay active and maintain a healthy diet - diabetic diet and regular exercise. Lose weight - Follow up with your primary care physician for stroke risk factor modification. Recommend maintain blood pressure goal <130/80, diabetes with hemoglobin A1c goal below 7.0% and lipids with LDL cholesterol goal below 70 mg/dL.   Patient will follow up in 6 months or call earlier if needed  I spent more than 25 minutes of face to face time with the patient. Greater than 50% of time was spent in counseling and coordination of care. We discussed follow up with IR, continue medication as instructed,  check BP and glucose at home  George Hugh, [facility]  [facility] [address] , [place] [address]  Phone [phone] Fax [phone] Note: This document was prepared with digital dictation and possible smart phrase technology. Any transcriptional errors that result from this process are unintentional.

## 2017-11-17 ENCOUNTER — Encounter: Payer: Self-pay | Admitting: Adult Health

## 2017-11-17 ENCOUNTER — Ambulatory Visit: Payer: BC Managed Care – PPO | Admitting: Adult Health

## 2017-11-17 VITALS — BP 143/89 | HR 72 | Ht 68.0 in | Wt 329.0 lb

## 2017-11-17 DIAGNOSIS — I10 Essential (primary) hypertension: Secondary | ICD-10-CM

## 2017-11-17 DIAGNOSIS — Z794 Long term (current) use of insulin: Secondary | ICD-10-CM

## 2017-11-17 DIAGNOSIS — E785 Hyperlipidemia, unspecified: Secondary | ICD-10-CM

## 2017-11-17 DIAGNOSIS — I63512 Cerebral infarction due to unspecified occlusion or stenosis of left middle cerebral artery: Secondary | ICD-10-CM

## 2017-11-17 DIAGNOSIS — E1159 Type 2 diabetes mellitus with other circulatory complications: Secondary | ICD-10-CM

## 2017-11-17 NOTE — Patient Instructions (Addendum)
Continue aspirin 81 mg daily and brilinta  and lipitor  for secondary stroke prevention  Check cholesterol panel today and consider decreasing lipitor dose or changing medication all together  Continue to follow up with PCP regarding cholesterol, diabetes and blood pressure management   Continue to follow with Dr. Corliss Skainseveshwar with imaging and scheduled follow ups  Continue to monitor blood pressure at home  Maintain strict control of hypertension with blood pressure goal below 130/90, diabetes with hemoglobin A1c goal below 6.5% and cholesterol with LDL cholesterol (bad cholesterol) goal below 70 mg/dL. I also advised the patient to eat a healthy diet with plenty of whole grains, cereals, fruits and vegetables, exercise regularly and maintain ideal body weight.  Followup in the future with me in 6 months or call earlier if needed        Thank you for coming to see us at [facility]. I hope we have been able to provide you high quality care today.  You may receive a patient satisfaction survey over the next few weeks. We would appreciate your feedback and comments so that we may continue to improve ourselves and the health of our patients.

## 2017-11-18 ENCOUNTER — Telehealth: Payer: Self-pay

## 2017-11-18 ENCOUNTER — Other Ambulatory Visit: Payer: Self-pay | Admitting: Adult Health

## 2017-11-18 LAB — LIPID PANEL
Chol/HDL Ratio: 2.3 ratio (ref 0.0–4.4)
Cholesterol, Total: 109 mg/dL (ref 100–199)
HDL: 47 mg/dL (ref >39)
LDL Calculated: 47 mg/dL (ref 0–99)
TRIGLYCERIDES: 75 mg/dL (ref 0–149)
VLDL Cholesterol Cal: 15 mg/dL (ref 5–40)

## 2017-11-18 MED ORDER — ATORVASTATIN CALCIUM 40 MG PO TABS: 40.0000 mg | ORAL_TABLET | Freq: Every day | ORAL | 3 refills | Status: DC

## 2017-11-18 NOTE — Telephone Encounter (Signed)
-----   Message from George HughJessica Vanschaick, NP sent at 11/18/2017  7:21 AM EDT ----- Please let Mashelle know that her bad cholesterol (LDL) looks great at 47 as we want it less than 70. As discussed during appointment, she can cut the lipitor in half and take 40mg  daily to see if this helps with possible statin side effect of myalgias. Please advise her to give the lower dose a few days to see if this helps. Thank you.

## 2017-11-18 NOTE — Progress Notes (Signed)
Recent lipid panel shows LDL 47. As discussed during recent appointment, she has possible statin side effect of myalgias. Lipitor 80mg  can be cut in half at 40mg  to see if this subsides myalgias.

## 2017-11-18 NOTE — Telephone Encounter (Signed)
Notes recorded by Hildred AlaminMurrell, Katrina Y, RN on 11/18/2017 at 9:26 AM EDT Rn call Chandi about her lab work for bad cholesterol LDL. Rn stated per Shanda BumpsJessica NP her labs look good. Rn stated they discuss during their appt she can cut her remaining 80 mg in half to 40mg  to see if this helps her possible side effect of myalgias. Shanda BumpsJessica NP recommend to give the lower dose a couple of days to see if it helps. Rn also stated a new rx was sent to her pharmacy of 40mg .Pt verbalized understanding. ------

## 2017-11-19 ENCOUNTER — Ambulatory Visit (HOSPITAL_COMMUNITY)
Admission: RE | Admit: 2017-11-19 | Discharge: 2017-11-19 | Disposition: A | Discharge status: Home / Self Care | Payer: BC Managed Care – PPO | Source: Ambulatory Visit | Attending: Interventional Radiology | Admitting: Interventional Radiology

## 2017-11-19 DIAGNOSIS — Z95828 Presence of other vascular implants and grafts: Secondary | ICD-10-CM | POA: Diagnosis not present

## 2017-11-19 DIAGNOSIS — I669 Occlusion and stenosis of unspecified cerebral artery: Secondary | ICD-10-CM | POA: Diagnosis not present

## 2017-11-19 DIAGNOSIS — Z8673 Personal history of transient ischemic attack (TIA), and cerebral infarction without residual deficits: Secondary | ICD-10-CM | POA: Diagnosis not present

## 2017-11-19 DIAGNOSIS — I639 Cerebral infarction, unspecified: Secondary | ICD-10-CM | POA: Diagnosis present

## 2017-11-19 LAB — POCT I-STAT CREATININE: CREATININE: 0.8 mg/dL (ref 0.44–1.00)

## 2017-11-19 MED ORDER — IOPAMIDOL (ISOVUE-370) INJECTION 76%
50.0000 mL | Freq: Once | INTRAVENOUS | Status: AC | PRN
  Administered 2017-11-19: 50 mL via INTRAVENOUS

## 2017-11-24 ENCOUNTER — Telehealth (HOSPITAL_COMMUNITY): Payer: Self-pay

## 2017-11-24 NOTE — Telephone Encounter (Signed)
Pt agreed to f/u in 6 months with cta head/neck. AW 

## 2017-11-25 ENCOUNTER — Telehealth: Payer: Self-pay | Admitting: *Deleted

## 2017-11-25 DIAGNOSIS — Z0289 Encounter for other administrative examinations: Secondary | ICD-10-CM

## 2017-11-25 NOTE — Telephone Encounter (Signed)
Pt Insurance form on [employer] desk.

## 2017-11-26 NOTE — Telephone Encounter (Signed)
Late entry from 11/25/17: I contacted pt yesterday to ask her about forms. She states she is applying for supplemental insurance and needs form filled out. Verified with her that she was placed on disability by her ortho MD for severe arthritis in right knee. Advised we did not place her on any kind of disability. She verified this form was not form disability, only insurance. She was a school bus driver. Advised I will speak with Shanda BumpsJessica, NP and call her back tomorrow to let her know if we can fill forms out for sure. She verbalized understanding.   I called patient back today 11/26/17. I relayed per Shanda BumpsJessica that we can fill forms out but we are writing on forms that we did not put her out on disability. That she is cleared neurologically. She does have some expressive aphasia post stroke. Pt verbalized understanding and appreciation. Quay Burowdvised Debra in medical records will contact her once forms are ready. She verbalized understanding.

## 2017-11-26 NOTE — Telephone Encounter (Signed)
Gave completed/signed forms back to medical records to process for pt.   

## 2017-12-04 NOTE — Progress Notes (Signed)
I agree with the above plan 

## 2017-12-05 ENCOUNTER — Telehealth: Payer: Self-pay

## 2017-12-05 NOTE — Telephone Encounter (Signed)
Rn spoke with Dr.Sethi,and Shanda BumpsJessica Np about refill sent from pts pharmacy for brilinta request. Per Dr. Pearlean BrownieSEthi he stated brilinta is usually 3 to 6 months when pts are seeing Dr. Alric Quaneveshwar,and than pt is on aspirin ongoing. He advised me pt should call Dr. Drusilla Kannereveshawar office to see if she should discontinue brilinta or just take aspirin ongoing.

## 2017-12-05 NOTE — Telephone Encounter (Signed)
RN spoke with patient about brilinta refill request. RN stated Dr. Pearlean BrownieSethi wants her to contact Dr. Corliss Skainseveshwar office about the medication management. PT stated she saw Dr. Corliss Skainseveshwar in July 2019 but was not told to stop it. She is still taking aspirin,and brilinta. Pt was given Dr. Corliss Skainseveshwar number to clarify,and will call us back before noon today.

## 2017-12-08 ENCOUNTER — Telehealth: Payer: Self-pay | Admitting: Student

## 2017-12-08 ENCOUNTER — Telehealth (HOSPITAL_COMMUNITY): Payer: Self-pay

## 2017-12-08 NOTE — Telephone Encounter (Signed)
Pt called wanting to know if she could stop her Brilinta. Informed pt that per Dr. Corliss Skainseveshwar, she is to stay on Brilinta. If she stops it could cause her to have a big stroke. She agreed with the plan to continue and asked for us to call in a refill. We will call into [facility]. AW

## 2017-12-08 NOTE — Progress Notes (Signed)
Prescription phoned into [facility] in [place], [place] [phone])- Brilinta 90 mg tablets, take one half tablet by mouth twice daily, dispense 30 tablets with 3 refills.  Waylan Bogalexandra M Abad Manard, PA-C 12/08/2017, 2:02 PM

## 2017-12-10 NOTE — Telephone Encounter (Signed)
See below note :Medication now being prescribed by the radiologist office Dr.Deveshwar,MD.   Baird LyonsLouk, Alexandra M, PA-C       12/08/17 2:02 PM  Note    Prescription phoned into [facility] [place], [place] [phone])- Brilinta 90 mg tablets, take one half tablet by mouth twice daily, dispense 30 tablets with 3 refills.  Waylan Bogalexandra M Louk, PA-C 12/08/2017, 2:02 PM

## 2017-12-17 ENCOUNTER — Other Ambulatory Visit: Payer: Self-pay | Admitting: Obstetrics and Gynecology

## 2017-12-17 ENCOUNTER — Ambulatory Visit
Admission: RE | Admit: 2017-12-17 | Discharge: 2017-12-17 | Disposition: A | Discharge status: Home / Self Care | Payer: BC Managed Care – PPO | Source: Ambulatory Visit | Attending: Obstetrics and Gynecology | Admitting: Obstetrics and Gynecology

## 2017-12-17 DIAGNOSIS — N6320 Unspecified lump in the left breast, unspecified quadrant: Secondary | ICD-10-CM

## 2017-12-17 DIAGNOSIS — N6489 Other specified disorders of breast: Secondary | ICD-10-CM

## 2018-01-01 ENCOUNTER — Ambulatory Visit: Payer: BC Managed Care – PPO | Admitting: Internal Medicine

## 2018-01-01 DIAGNOSIS — Z0289 Encounter for other administrative examinations: Secondary | ICD-10-CM

## 2018-01-02 ENCOUNTER — Ambulatory Visit: Payer: BC Managed Care – PPO | Admitting: Internal Medicine

## 2018-01-05 ENCOUNTER — Encounter: Payer: Self-pay | Admitting: Internal Medicine

## 2018-01-05 ENCOUNTER — Ambulatory Visit: Payer: BC Managed Care – PPO | Admitting: Internal Medicine

## 2018-01-05 ENCOUNTER — Other Ambulatory Visit (INDEPENDENT_AMBULATORY_CARE_PROVIDER_SITE_OTHER): Payer: BC Managed Care – PPO

## 2018-01-05 VITALS — BP 132/76 | HR 69 | Temp 98.7°F | Ht 68.0 in | Wt 329.0 lb

## 2018-01-05 DIAGNOSIS — E1169 Type 2 diabetes mellitus with other specified complication: Secondary | ICD-10-CM

## 2018-01-05 DIAGNOSIS — E1165 Type 2 diabetes mellitus with hyperglycemia: Secondary | ICD-10-CM

## 2018-01-05 DIAGNOSIS — I10 Essential (primary) hypertension: Secondary | ICD-10-CM | POA: Diagnosis not present

## 2018-01-05 DIAGNOSIS — Z8673 Personal history of transient ischemic attack (TIA), and cerebral infarction without residual deficits: Secondary | ICD-10-CM

## 2018-01-05 DIAGNOSIS — E785 Hyperlipidemia, unspecified: Secondary | ICD-10-CM

## 2018-01-05 LAB — COMPREHENSIVE METABOLIC PANEL
ALK PHOS: 103 U/L (ref 39–117)
ALT: 17 U/L (ref 0–35)
AST: 12 U/L (ref 0–37)
Albumin: 3.5 g/dL (ref 3.5–5.2)
BUN: 9 mg/dL (ref 6–23)
CO2: 28 mEq/L (ref 19–32)
Calcium: 8.9 mg/dL (ref 8.4–10.5)
Chloride: 104 mEq/L (ref 96–112)
Creatinine, Ser: 0.77 mg/dL (ref 0.40–1.20)
GFR: 104.88 mL/min (ref >60.00)
Glucose, Bld: 249 mg/dL — ABNORMAL HIGH (ref 70–99)
POTASSIUM: 3.6 meq/L (ref 3.5–5.1)
Sodium: 140 mEq/L (ref 135–145)
TOTAL PROTEIN: 6.4 g/dL (ref 6.0–8.3)
Total Bilirubin: 0.4 mg/dL (ref 0.2–1.2)

## 2018-01-05 LAB — CBC
HEMATOCRIT: 40.2 % (ref 36.0–46.0)
Hemoglobin: 13 g/dL (ref 12.0–15.0)
MCHC: 32.5 g/dL (ref 30.0–36.0)
MCV: 78.8 fl (ref 78.0–100.0)
Platelets: 314 10*3/uL (ref 150.0–400.0)
RBC: 5.1 Mil/uL (ref 3.87–5.11)
RDW: 13.8 % (ref 11.5–15.5)
WBC: 8.7 10*3/uL (ref 4.0–10.5)

## 2018-01-05 LAB — HEMOGLOBIN A1C: Hgb A1c MFr Bld: 11.1 % — ABNORMAL HIGH (ref 4.6–6.5)

## 2018-01-05 MED ORDER — INSULIN GLARGINE-LIXISENATIDE 100-33 UNT-MCG/ML ~~LOC~~ SOPN: 38.0000 [IU] | PEN_INJECTOR | Freq: Every day | SUBCUTANEOUS | 4 refills | Status: DC

## 2018-01-05 NOTE — Patient Instructions (Signed)
We have sent in refill of the soliqua at the right dose.   We are checking the labs today.

## 2018-01-05 NOTE — Progress Notes (Signed)
   Subjective:    Patient ID: Debra [name], female    DOB: 12/24/1975, 42 y.o.   MRN: [id number]  HPI The patient is a 42 YO female coming in for follow up of diabetes. She is still using soliqua and is up to 38 units in the morning. She is having some increased cravings for foods. Trying not to eat in the evening but this is difficult for her. She is also still taking glipizide. She denies numbness or tingling in arms or legs. Denies new or worsening stroke symptoms. Still having some speech difficulties although this is gradually improving. Notices it is more slurred when low sleep or increased stress.  She also needs follow up of blood pressure (taking amlodipine and losartan with good success, denies side effects, denies chest pains or SOB or headaches, denies missing doses), and her stroke recent (still taking brilinta 1/2 twice a day and aspirin 81 mg daily, will be on this for 6 months after stroke and then continue on just aspirin 81 mg daily, BP at goal, diabetes not at goal, weight is stable but not decreasing, denies worsening symptoms, speech as above still impaired some).   Review of Systems  Constitutional: Positive for activity change, appetite change and fatigue.  HENT: Negative.   Eyes: Negative.   Respiratory: Negative for cough, chest tightness and shortness of breath.   Cardiovascular: Negative for chest pain, palpitations and leg swelling.  Gastrointestinal: Negative for abdominal distention, abdominal pain, constipation, diarrhea, nausea and vomiting.  Musculoskeletal: Negative.   Skin: Negative.   Neurological: Positive for speech difficulty. Negative for dizziness, tremors, weakness, light-headedness and headaches.  Psychiatric/Behavioral: Negative.       Objective:   Physical Exam  Constitutional: She is oriented to person, place, and time. She appears well-developed and well-nourished.  overweight  HENT:  Head: Normocephalic and atraumatic.  Eyes: EOM are normal.   Neck: Normal range of motion.  Cardiovascular: Normal rate and regular rhythm.  Pulmonary/Chest: Effort normal and breath sounds normal. No respiratory distress. She has no wheezes. She has no rales.  Abdominal: Soft. Bowel sounds are normal. She exhibits no distension. There is no tenderness. There is no rebound.  Musculoskeletal: She exhibits no edema.  Neurological: She is alert and oriented to person, place, and time. Coordination normal.  Speech is fluid although sometimes she does think about what she needs to say for longer than in the past.   Skin: Skin is warm and dry.  Psychiatric: She has a normal mood and affect.   Vitals:   01/05/18 0945  BP: 132/76  Pulse: 69  Temp: 98.7 F (37.1 C)  TempSrc: Oral  SpO2: 97%  Weight: (!) 329 lb (149.2 kg)  Height: 5\' 8"  (1.727 m)      Assessment & Plan:

## 2018-01-06 MED ORDER — AMLODIPINE BESYLATE 10 MG PO TABS: 10.0000 mg | ORAL_TABLET | Freq: Every day | ORAL | 3 refills | Status: DC

## 2018-01-06 NOTE — Assessment & Plan Note (Signed)
Last LDL better and taking lipitor 40 mg daily 1/2 pill per neurology and has not noticed any improvement in myalgias with lower dose.

## 2018-01-06 NOTE — Assessment & Plan Note (Signed)
BP at goal, diabetes still needs work. Taking brilinta and aspirin 81 mg daily currently and plan is for long term aspirin 81 mg daily only timing per neurology.

## 2018-01-06 NOTE — Assessment & Plan Note (Signed)
BP at goal on amlodipine and losartan. Refilled as needed. Checking CMP and adjust as needed.

## 2018-01-06 NOTE — Assessment & Plan Note (Signed)
With complication of past stroke, foot exam done. Checking HgA1c. Taking soliqua and glipizide and may need additional medications. We talked about the importance of good control to prevent future stroke. Taking ARB and statin. Taking daily aspirin. Adjust as needed. This is severely elevated per last HgA1c although fructosamine indicates some better control.

## 2018-01-07 ENCOUNTER — Encounter: Payer: Self-pay | Admitting: Internal Medicine

## 2018-01-07 MED ORDER — EMPAGLIFLOZIN 25 MG PO TABS: 25.0000 mg | ORAL_TABLET | Freq: Every day | ORAL | 1 refills | Status: DC

## 2018-01-15 ENCOUNTER — Encounter: Payer: Self-pay | Admitting: Internal Medicine

## 2018-01-17 ENCOUNTER — Encounter: Payer: Self-pay | Admitting: Internal Medicine

## 2018-01-19 ENCOUNTER — Encounter: Payer: Self-pay | Admitting: Family

## 2018-01-19 ENCOUNTER — Ambulatory Visit: Payer: BC Managed Care – PPO | Admitting: Family

## 2018-01-19 VITALS — BP 144/78 | HR 68 | Temp 98.3°F | Ht 68.0 in | Wt 323.1 lb

## 2018-01-19 DIAGNOSIS — J209 Acute bronchitis, unspecified: Secondary | ICD-10-CM | POA: Diagnosis not present

## 2018-01-19 MED ORDER — AZITHROMYCIN 250 MG PO TABS: ORAL_TABLET | ORAL | 0 refills | Status: DC

## 2018-01-19 MED ORDER — NYSTATIN-TRIAMCINOLONE 100000-0.1 UNIT/GM-% EX OINT: 1.0000 "application " | TOPICAL_OINTMENT | Freq: Two times a day (BID) | CUTANEOUS | 0 refills | Status: DC

## 2018-01-19 MED ORDER — TICAGRELOR 90 MG PO TABS: 45.0000 mg | ORAL_TABLET | Freq: Two times a day (BID) | ORAL | 0 refills | Status: DC

## 2018-01-19 NOTE — Progress Notes (Signed)
Debra [name] is a 42 y.o. female with the following history as recorded in EpicCare:  Patient Active Problem List   Diagnosis Date Noted  . Hot flashes 10/04/2017  . Stenosis of both middle cerebral arteries 07/07/2017  . Hyperlipidemia associated with type 2 diabetes mellitus ([place])   . TIA (transient ischemic attack) 07/01/2017  . Hx of completed stroke 07/01/2017  . Left shoulder pain 05/21/2016  . Routine general medical examination at a health care facility 10/13/2015  . Hypertension 08/26/2011  . Hx of laparoscopic gastric banding, 10/02/2010. 11/27/2010  . Diabetes mellitus type 2, uncontrolled ([place]) 08/23/2009  . Morbid obesity ([place]) 08/22/2009  . Allergic rhinitis 08/22/2009  . Asthma 08/22/2009  . ARTHRITIS 08/22/2009    Current Outpatient Medications  Medication Sig Dispense Refill  . albuterol (PROAIR HFA) 108 (90 BASE) MCG/ACT inhaler Inhale 2 puffs into the lungs every 4 (four) hours as needed for wheezing or shortness of breath. 18 g 2  . albuterol (PROVENTIL) (2.5 MG/3ML) 0.083% nebulizer solution Take 2.5 mg by nebulization every 6 (six) hours as needed for wheezing or shortness of breath.    [name] amLODipine (NORVASC) 10 MG tablet Take 1 tablet (10 mg total) by mouth daily. 90 tablet 3  . aspirin EC 81 MG tablet Take 81 mg by mouth daily.    [name] atorvastatin (LIPITOR) 40 MG tablet Take 1 tablet (40 mg total) by mouth daily. 90 tablet 3  . blood glucose meter kit and supplies Dispense based on patient and insurance preference. Use up to four times daily as directed. (FOR ICD-9 250.00, 250.01). 1 each 0  . Docusate Sodium (COLACE PO) Take by mouth.    . empagliflozin (JARDIANCE) 25 MG TABS tablet Take 25 mg by mouth daily. 90 tablet 1  . fexofenadine (ALLEGRA) 180 MG tablet Take 180 mg by mouth daily as needed. For seasonal allergies    . fluticasone (FLONASE) 50 MCG/ACT nasal spray Place 2 sprays into both nostrils daily. 16 g 6  . glipiZIDE (GLUCOTROL) 5 MG tablet TAKE 1  TABLET(5 MG) BY MOUTH TWICE DAILY BEFORE A MEAL 180 tablet 1  . glucose blood (ONE TOUCH ULTRA TEST) test strip Use as directed 100 each 2  . glucose blood test strip Used to check blood sugars up to four times daily as needed 200 each 3  . ibuprofen (ADVIL,MOTRIN) 800 MG tablet ibuprofen 800 mg tablet    . Insulin Glargine-Lixisenatide (SOLIQUA) 100-33 UNT-MCG/ML SOPN Inject 38 Units into the skin daily. 12 pen 4  . Insulin Pen Needle (B-D ULTRAFINE III SHORT PEN) 31G X 8 MM MISC USE AS DIRECTED TO INJECT INSULIN. 100 each 0  . Lancets MISC Used to check blood sugars up to four times daily as needed 200 each 3  . losartan (COZAAR) 100 MG tablet TAKE 1 TABLET BY MOUTH DAILY 90 tablet 1  . nystatin-triamcinolone ointment (MYCOLOG) Apply 1 application topically 2 (two) times daily. 60 g 0  . olopatadine (PATANOL) 0.1 % ophthalmic solution Apply to eye.    . ticagrelor (BRILINTA) 90 MG TABS tablet Take 0.5 tablets (45 mg total) by mouth 2 (two) times daily. 60 tablet 0  . azithromycin (ZITHROMAX) 250 MG tablet 2 tabs po qd x 1 day; 1 tablet per day x 4 days; 6 tablet 0   No current facility-administered medications for this visit.     Allergies: Influenza vaccines; Metformin and related; Morphine; and Augmentin [amoxicillin-pot clavulanate]  Past Medical History:  Diagnosis Date  .  Allergic rhinitis, cause unspecified   . Asthma   . Diabetes mellitus type II 08/2009 dx  . Endometriosis   . Hypertension   . Knee pain, right    DJD, post traumatic with repeat falls  . Morbid obesity ([place])    s/p lab band 09/2010  . Ovarian cyst    right s/p resection June 2013  . Stroke [facility])     Past Surgical History:  Procedure Laterality Date  . ABDOMINAL HYSTERECTOMY  05/14/12   [place]  . IR ANGIO INTRA EXTRACRAN SEL COM CAROTID INNOMINATE BILAT MOD SED  07/04/2017  . IR ANGIO VERTEBRAL SEL VERTEBRAL BILAT MOD SED  07/04/2017  . IR INTRA CRAN STENT  07/07/2017  . IR RADIOLOGIST EVAL &  MGMT  08/13/2017  . LAPAROSCOPIC GASTRIC BANDING  10/02/2010   start weight 349#  . RADIOLOGY WITH ANESTHESIA N/A 07/07/2017   Procedure: STENTING;  Surgeon: Luanne Bras, MD;  Location: [facility];  Service: Radiology;  Laterality: N/A;  . SALPINGOOPHORECTOMY  10/08/11   right    Family History  Problem Relation Age of Onset  . Heart disease Mother   . Hypertension Mother   . Diabetes Mother   . Stroke Father 48  . Diabetes Father     Social History   Tobacco Use  . Smoking status: Never Smoker  . Smokeless tobacco: Never Used  Substance Use Topics  . Alcohol use: No    Alcohol/week: 0.0 standard drinks    Subjective:  Patient presents with cold symptoms x 4 days; has underlying allergies- on Allegra, Flonase; no fever, no chest pain or shortness of breath; has needed to use her inhaler more in the past few days;    Objective:  Vitals:   01/19/18 1601  BP: (!) 144/78  Pulse: 68  Temp: 98.3 F (36.8 C)  TempSrc: Oral  SpO2: 99%  Weight: (!) 323 lb 1.9 oz (146.6 kg)  Height: _0  (1.727 m)    General: Well developed, well nourished, in no acute distress  Skin : Warm and dry.  Head: Normocephalic and atraumatic  Eyes: Sclera and conjunctiva clear; pupils round and reactive to light; extraocular movements intact  Ears: External normal; canals clear; tympanic membranes normal  Oropharynx: Pink, supple. No suspicious lesions  Neck: Supple without thyromegaly, adenopathy  Lungs: Respirations unlabored; clear to auscultation bilaterally without wheeze, rales, rhonchi  CVS exam: normal rate and regular rhythm.  Neurologic: Alert and oriented; speech intact; face symmetrical; moves all extremities well; CNII-XII intact without focal deficit   Assessment:  1. Acute bronchitis, unspecified organism     Plan:  Rx for Z-pak #1 take as directed; sample of BREO 100 mg qd; increase fluids, rest and follow-up worse, no better.  No follow-ups on file.  No orders of the defined  types were placed in this encounter.   Requested Prescriptions   Signed Prescriptions Disp Refills  . nystatin-triamcinolone ointment (MYCOLOG) 60 g 0    Sig: Apply 1 application topically 2 (two) times daily.  . ticagrelor (BRILINTA) 90 MG TABS tablet 60 tablet 0    Sig: Take 0.5 tablets (45 mg total) by mouth 2 (two) times daily.  [name] azithromycin (ZITHROMAX) 250 MG tablet 6 tablet 0    Sig: 2 tabs po qd x 1 day; 1 tablet per day x 4 days;

## 2018-01-19 NOTE — Telephone Encounter (Signed)
Pt has made appt w/Laura concerning sxs.[name] KitchenRaechel Chute

## 2018-01-21 ENCOUNTER — Telehealth: Payer: Self-pay | Admitting: Internal Medicine

## 2018-01-21 NOTE — Telephone Encounter (Signed)
Copied from CRM [id number]. Topic: General - Other >> Jan 21, 2018  1:54 PM Percival Spanish wrote:  Pt saw  dentist Dr Consuella Lose today and they are asking for her to have a note from he PCP stating that is is ok for her to have dental work done.

## 2018-01-22 NOTE — Telephone Encounter (Signed)
Called patient got fax number and faxed letter and med list to fax number given per patient request

## 2018-01-22 NOTE — Telephone Encounter (Signed)
Pt stated that per the office if a patient has had a stroke in the last 6 months they have to be cleared to get a cleaning doen. She is at her 6 month mark. She doe snot know if the office needs a copy of her med list. The office is [facility]  phone number [phone]. I called twice and no one answered. I do not know the fax number. Please advise on note for clearance for the cleaning.

## 2018-01-22 NOTE — Telephone Encounter (Signed)
No medical restrictions on cleaning. If they are asking to stop anticoagulation would need to review.

## 2018-01-22 NOTE — Telephone Encounter (Signed)
Are they asking to stop her anticoagulation or proceed while on anticoagulation?

## 2018-03-19 ENCOUNTER — Other Ambulatory Visit: Payer: Self-pay | Admitting: Internal Medicine

## 2018-03-28 ENCOUNTER — Other Ambulatory Visit: Payer: Self-pay | Admitting: Internal Medicine

## 2018-04-03 ENCOUNTER — Ambulatory Visit: Payer: BC Managed Care – PPO | Admitting: Internal Medicine

## 2018-04-03 ENCOUNTER — Encounter: Payer: Self-pay | Admitting: Internal Medicine

## 2018-04-03 ENCOUNTER — Telehealth: Payer: Self-pay | Admitting: Radiology

## 2018-04-03 DIAGNOSIS — J4521 Mild intermittent asthma with (acute) exacerbation: Secondary | ICD-10-CM

## 2018-04-03 MED ORDER — AZITHROMYCIN 250 MG PO TABS: ORAL_TABLET | ORAL | 0 refills | Status: DC

## 2018-04-03 NOTE — Progress Notes (Signed)
   Refilled Pharmacy request for Brilinta refill per Dr Debera Lateveshwar [facility] [address] [place], [place]    Brilinta 1/2 tablet daily #90  Pt is to return for follow up and repeat P2y12 per Dr Dellis Aneseveshwar  Scheduler will contact pt.

## 2018-04-03 NOTE — Progress Notes (Signed)
   Subjective:    Patient ID: Debra [name], female    DOB: 05/09/1975, 42 y.o.   MRN: [id number]  HPI The patient is a 42 YO female coming in for cough, sore throat and sinus symptoms. Started about 1 week ago. She has been using albuterol inhaler more often and having some SOB. Denies fevers or chills. Denies ear pain. Denies headaches. Taking allegra and flonase without relief. Overall is worsening. She has had flare before and this feels similar. Denies taking cold medicine due to not knowing if she can take it.   Review of Systems  Constitutional: Negative.   HENT: Negative.   Eyes: Negative.   Respiratory: Negative for cough, chest tightness and shortness of breath.   Cardiovascular: Negative for chest pain, palpitations and leg swelling.  Gastrointestinal: Negative for abdominal distention, abdominal pain, constipation, diarrhea, nausea and vomiting.  Musculoskeletal: Negative.   Skin: Negative.   Neurological: Negative.   Psychiatric/Behavioral: Negative.       Objective:   Physical Exam Constitutional:      Appearance: She is well-developed.  HENT:     Head: Normocephalic and atraumatic.     Comments: Oropharynx with redness and clear drainage, nose with swollen turbinates, TMs normal bilaterally.  Neck:     Musculoskeletal: Normal range of motion.     Thyroid: No thyromegaly.  Cardiovascular:     Rate and Rhythm: Normal rate and regular rhythm.  Pulmonary:     Effort: Pulmonary effort is normal. No respiratory distress.     Breath sounds: Normal breath sounds. No wheezing or rales.  Abdominal:     Palpations: Abdomen is soft.  Musculoskeletal:        General: Tenderness present.  Lymphadenopathy:     Cervical: No cervical adenopathy.  Skin:    General: Skin is warm and dry.  Neurological:     Mental Status: She is alert and oriented to person, place, and time.    Vitals:   04/03/18 1418  BP: 110/74  Pulse: 63  Temp: 98.1 F (36.7 C)  TempSrc: Oral    SpO2: 99%  Height: 5\' 8"  (1.727 m)      Assessment & Plan:

## 2018-04-03 NOTE — Assessment & Plan Note (Signed)
Rx for azithromycin 7 day course. Given other medical conditions would like to avoid steroids. Can continue albuterol inhaler and allegra and flonase.

## 2018-04-03 NOTE — Patient Instructions (Signed)
We have sent in azithromycin to take 2 pills today, then 1 pill daily until gone starting tomorrow.

## 2018-04-06 ENCOUNTER — Telehealth: Payer: Self-pay | Admitting: Adult Health

## 2018-04-06 NOTE — Telephone Encounter (Signed)
Patient stated she got a refill on Brilinta but she is wondering if she needs to stay on it or come off of it. Because she is suppose to be checked every 3 months and she just wants to make sure she is suppose to stay on it.

## 2018-04-06 NOTE — Telephone Encounter (Signed)
RN call pt that her brilinta is manage by radiologist Dr. Antony Odeaevehswar office.Rn stated refills have been sent from his office. PT was given number to call Dr. Nicki Reaperevshwar office. She verbalized understanding.

## 2018-04-09 ENCOUNTER — Encounter: Payer: Self-pay | Admitting: Internal Medicine

## 2018-04-09 ENCOUNTER — Ambulatory Visit: Payer: BC Managed Care – PPO | Admitting: Internal Medicine

## 2018-04-09 VITALS — BP 112/70 | HR 58 | Temp 98.9°F | Ht 68.0 in | Wt 320.0 lb

## 2018-04-09 DIAGNOSIS — B379 Candidiasis, unspecified: Secondary | ICD-10-CM | POA: Diagnosis not present

## 2018-04-09 DIAGNOSIS — E1165 Type 2 diabetes mellitus with hyperglycemia: Secondary | ICD-10-CM | POA: Diagnosis not present

## 2018-04-09 DIAGNOSIS — J4521 Mild intermittent asthma with (acute) exacerbation: Secondary | ICD-10-CM

## 2018-04-09 LAB — POCT GLYCOSYLATED HEMOGLOBIN (HGB A1C): Hemoglobin A1C: 9.2 % — AB (ref 4.0–5.6)

## 2018-04-09 MED ORDER — FLUCONAZOLE 150 MG PO TABS: 150.0000 mg | ORAL_TABLET | Freq: Every day | ORAL | 0 refills | Status: DC

## 2018-04-09 MED ORDER — METHYLPREDNISOLONE ACETATE 40 MG/ML IJ SUSP
40.0000 mg | Freq: Once | INTRAMUSCULAR | Status: AC
  Administered 2018-04-09: 40 mg via INTRAMUSCULAR

## 2018-04-09 NOTE — Assessment & Plan Note (Signed)
London PepperJardiance is likely the cause. Will do diflucan 150 mg daily for 1 week and then weekly for 3 weeks to erradicate and hopefully stop her problems to keep her on jardiance as it is helping well with her blood sugars.

## 2018-04-09 NOTE — Assessment & Plan Note (Signed)
HgA1c 9.2 today which is a good improvement. We will continue jardiance and glipizide and see her back in 3 months and adjust at that time if needed.

## 2018-04-09 NOTE — Patient Instructions (Addendum)
We have sent in the diflucan to take 1 pill daily for 1 week, then 1 pill weekly for 3 more weeks.   Keep the medicines the same for the sugars and come back in 3 months.

## 2018-04-09 NOTE — Progress Notes (Signed)
   Subjective:    Patient ID: Debra [name], female    DOB: 10/12/1975, 42 y.o.   MRN: [id number]  HPI The patient is a 42 YO female coming in for follow up of her poorly controlled diabetes (has started taking jardiance and is still taking glipizide, she did stop soliqua as this in conjunction with everything else was causing low sugars, she is working on weight and is stable now, denies new numbness or weakness, denies low sugars, on this regimen for about 2-3 months), and asthma (just finished z-pack and is feeling less congestion, still very significant drainage, still hard time breathing and pressure in her chest, not able to get a full breath, denies fevers or chills, ear pain is decreasing), and yeast infections (since starting jardiance has yeast discharge on some days, has used creams over the counter which help some, denies itching or pain, denies new sexual partner)  Review of Systems  Constitutional: Positive for activity change and appetite change. Negative for chills, fatigue, fever and unexpected weight change.  HENT: Positive for congestion, postnasal drip, rhinorrhea and sinus pressure. Negative for ear discharge, ear pain, sinus pain, sneezing, sore throat, tinnitus, trouble swallowing and voice change.   Eyes: Negative.   Respiratory: Positive for cough, chest tightness and shortness of breath. Negative for wheezing.   Cardiovascular: Negative.  Negative for chest pain, palpitations and leg swelling.  Gastrointestinal: Negative.  Negative for abdominal distention, abdominal pain, constipation, diarrhea, nausea and vomiting.  Genitourinary: Positive for vaginal discharge. Negative for difficulty urinating, dysuria, frequency, genital sores and urgency.  Musculoskeletal: Positive for myalgias.  Skin: Negative.   Neurological: Negative.   Psychiatric/Behavioral: Negative.       Objective:   Physical Exam Constitutional:      Appearance: She is well-developed.  HENT:   Head: Normocephalic and atraumatic.     Comments: Oropharynx with redness and clear drainage, nose with swollen turbinates, TMs normal bilaterally.  Neck:     Musculoskeletal: Normal range of motion.     Thyroid: No thyromegaly.  Cardiovascular:     Rate and Rhythm: Normal rate and regular rhythm.  Pulmonary:     Effort: Pulmonary effort is normal. No respiratory distress.     Breath sounds: Normal breath sounds. No wheezing or rales.  Abdominal:     General: Bowel sounds are normal. There is no distension.     Palpations: Abdomen is soft.     Tenderness: There is no abdominal tenderness. There is no rebound.  Musculoskeletal:        General: Tenderness present.  Lymphadenopathy:     Cervical: No cervical adenopathy.  Skin:    General: Skin is warm and dry.  Neurological:     Mental Status: She is alert and oriented to person, place, and time.     Coordination: Coordination normal.    Vitals:   04/09/18 1048  BP: 112/70  Pulse: (!) 58  Temp: 98.9 F (37.2 C)  TempSrc: Oral  SpO2: 98%  Weight: (!) 320 lb (145.2 kg)  Height: 5\' 8"  (1.727 m)      Assessment & Plan:  Depo-medrol 40 mg IM given at visit

## 2018-04-09 NOTE — Assessment & Plan Note (Signed)
Depo-medrol 40 mg given at visit for continued flare although symptoms are improving. No indication for more antibiotics.

## 2018-04-20 ENCOUNTER — Encounter (HOSPITAL_COMMUNITY): Payer: Self-pay

## 2018-05-01 ENCOUNTER — Telehealth: Payer: Self-pay | Admitting: Student

## 2018-05-01 NOTE — Telephone Encounter (Signed)
Faxed prescription to [facility] pharmacy on [address] in [place], [place] [phone])- Brilinta 90 mg tablets, take 1/2 tablet by mouth twice daily, dispense 30 tablets with 3 refills.  Waylan Boga Louk, PA-C 05/01/2018, 1:47 PM

## 2018-05-04 ENCOUNTER — Other Ambulatory Visit: Payer: Self-pay | Admitting: Radiology

## 2018-05-04 MED ORDER — TICAGRELOR 90 MG PO TABS: 45.0000 mg | ORAL_TABLET | Freq: Two times a day (BID) | ORAL | 0 refills | Status: DC

## 2018-05-05 ENCOUNTER — Encounter: Payer: Self-pay | Admitting: Family Medicine

## 2018-05-05 ENCOUNTER — Ambulatory Visit (INDEPENDENT_AMBULATORY_CARE_PROVIDER_SITE_OTHER): Payer: BC Managed Care – PPO

## 2018-05-05 ENCOUNTER — Ambulatory Visit: Payer: BC Managed Care – PPO | Admitting: Family Medicine

## 2018-05-05 ENCOUNTER — Ambulatory Visit: Payer: BC Managed Care – PPO | Admitting: Family

## 2018-05-05 ENCOUNTER — Ambulatory Visit: Payer: Self-pay

## 2018-05-05 VITALS — BP 124/78 | HR 56 | Temp 98.7°F | Ht 68.0 in

## 2018-05-05 DIAGNOSIS — M25561 Pain in right knee: Secondary | ICD-10-CM

## 2018-05-05 MED ORDER — IBUPROFEN-FAMOTIDINE 800-26.6 MG PO TABS: 1.0000 | ORAL_TABLET | Freq: Three times a day (TID) | ORAL | 3 refills | Status: DC

## 2018-05-05 MED ORDER — DICLOFENAC SODIUM 2 % TD SOLN: 1.0000 "application " | Freq: Two times a day (BID) | TRANSDERMAL | 3 refills | Status: DC

## 2018-05-05 NOTE — Patient Instructions (Addendum)
Nice to meet you  Please try to ice the knee  Please use tylenol for pain  Please use the brace  I will call you with the results from today  Please see me back in 1 week.

## 2018-05-05 NOTE — Telephone Encounter (Signed)
Pt feel and all her weight went to her knees. Pt stated that her knees are swollen and pain is 8/10 to the right knee and 4/10 to the left knee. Pt stated that she has arthritis to the right knee and it is more swollen than the left.  Care advice given and pt verbalized understanding. No appts with PCP today. Appt made with Ria Clock NP today.   Reason for Disposition . [1] Very swollen joint AND [2] no fever  Answer Assessment - Initial Assessment Questions 1. LOCATION: "Where is the swelling located?"  (e.g., left, right, both knees)     Both knees  2. SIZE and DESCRIPTION: "What does the swelling look like?"  (e.g., entire knee, localized)     Left knee with goose egg right one swollen  3. ONSET: "When did the swelling start?" "Does it come and go, or is it there all the time?"     Last night 4. PAIN: "Is there any pain?" If so, ask: "How bad is it?" (Scale 1-10; or mild, moderate, severe)     Burning pain-bending right knee hurts bends (8/10)the left one sore (4/10) 5. SETTING: "Has there been any recent work, exercise or other activity that involved that part of the body?"      Fell last night 6. AGGRAVATING FACTORS: "What makes the knee swelling worse?" (e.g., walking, climbing stairs, running)     Standing bending makes it worse 7. ASSOCIATED SYMPTOMS: "Is there any pain or redness?"     pain 8. OTHER SYMPTOMS: "Do you have any other symptoms?" (e.g., chest pain, difficulty breathing, fever, calf pain)     no 9. PREGNANCY: "Is there any chance you are pregnant?" "When was your last menstrual period?"     N/a- n/a  Protocols used: KNEE SWELLING-A-AH

## 2018-05-05 NOTE — Assessment & Plan Note (Signed)
Effusion and pain likely result of a flare of her underlying arthritis.  Independent review of the x-ray did not demonstrate a fracture.  Possible to have a meniscal injury as well.  Exam limited today -Provided Vimovo samples.  Sent in Duexis and Pennsaid. -Continue the brace -X-ray. -Counseled on ice -Follow-up in 1 week to reevaluate

## 2018-05-05 NOTE — Progress Notes (Signed)
Debra [name] - 43 y.o. female MRN [id number]  Date of birth: 25-Mar-1976  SUBJECTIVE:  Including CC & ROS.  Chief Complaint  Patient presents with  . Knee Pain    both , fell last night     Debra [name] is a 43 y.o. female that is presenting with bilateral knee pain.  She was walking up the stairs last night and fell and landed on her knees.  Since that time she has had swelling of the right knee.  She has had limited range of motion of the right knee with intense and constant pain.  She is tried using ibuprofen with limited improvement.  She has anterior pain on each knee.  The left knee pain is less severe than the right.  She has a history of arthritis.  No prior surgeries.  Is unable to bear weight without pain.  Pain is localized to the knee.  It is sharp and stabbing.  Denies any numbness or tingling.   Review of Systems  Constitutional: Negative for fever.  HENT: Negative for congestion.   Respiratory: Negative for cough.   Cardiovascular: Negative for chest pain.  Gastrointestinal: Negative for abdominal pain.  Musculoskeletal: Positive for arthralgias, gait problem and joint swelling.  Skin: Negative for color change.  Neurological: Negative for weakness.  Hematological: Bruises/bleeds easily.  Psychiatric/Behavioral: Negative for agitation.    HISTORY: Past Medical, Surgical, Social, and Family History Reviewed & Updated per EMR.   Pertinent Historical Findings include:  Past Medical History:  Diagnosis Date  . Allergic rhinitis, cause unspecified   . Asthma   . Diabetes mellitus type II 08/2009 dx  . Endometriosis   . Hypertension   . Knee pain, right    DJD, post traumatic with repeat falls  . Morbid obesity (HCC)    s/p lab band 09/2010  . Ovarian cyst    right s/p resection June 2013  . Stroke [facility])     Past Surgical History:  Procedure Laterality Date  . ABDOMINAL HYSTERECTOMY  05/14/12   [place]  . IR ANGIO INTRA EXTRACRAN SEL COM CAROTID  INNOMINATE BILAT MOD SED  07/04/2017  . IR ANGIO VERTEBRAL SEL VERTEBRAL BILAT MOD SED  07/04/2017  . IR INTRA CRAN STENT  07/07/2017  . IR RADIOLOGIST EVAL & MGMT  08/13/2017  . LAPAROSCOPIC GASTRIC BANDING  10/02/2010   start weight 349#  . RADIOLOGY WITH ANESTHESIA N/A 07/07/2017   Procedure: STENTING;  Surgeon: Julieanne Cotton, MD;  Location: [facility] OR;  Service: Radiology;  Laterality: N/A;  . SALPINGOOPHORECTOMY  10/08/11   right    Allergies  Allergen Reactions  . Influenza Vaccines   . Metformin And Related Diarrhea  . Morphine Hives  . Augmentin [Amoxicillin-Pot Clavulanate] Rash    To bilat post hands only    Family History  Problem Relation Age of Onset  . Heart disease Mother   . Hypertension Mother   . Diabetes Mother   . Stroke Father 20  . Diabetes Father      Social History   Socioeconomic History  . Marital status: Single    Spouse name: n/a  . Number of children: 0  . Years of education: 12+  . Highest education level: Not on file  Occupational History  . Occupation: school bus driver  Social Needs  . Financial resource strain: Not on file  . Food insecurity:    Worry: Not on file    Inability: Not on file  . Transportation  needs:    Medical: Not on file    Non-medical: Not on file  Tobacco Use  . Smoking status: Never Smoker  . Smokeless tobacco: Never Used  Substance and Sexual Activity  . Alcohol use: No    Alcohol/week: 0.0 standard drinks  . Drug use: No  . Sexual activity: Not on file  Lifestyle  . Physical activity:    Days per week: Not on file    Minutes per session: Not on file  . Stress: Not on file  Relationships  . Social connections:    Talks on phone: Not on file    Gets together: Not on file    Attends religious service: Not on file    Active member of club or organization: Not on file    Attends meetings of clubs or organizations: Not on file    Relationship status: Not on file  . Intimate partner violence:    Fear of  current or ex partner: Not on file    Emotionally abused: Not on file    Physically abused: Not on file    Forced sexual activity: Not on file  Other Topics Concern  . Not on file  Social History Narrative   Single, lives with mom.    Previously Employed as Building control surveyor for [employer] - not driving bus since 15/1761 knee injury s/p fall on steps.   Completed some college.     PHYSICAL EXAM:  VS: BP 124/78   Pulse (!) 56   Temp 98.7 F (37.1 C) (Oral)   Ht 5\' 8"  (1.727 m)   LMP 07/28/2011   SpO2 99%   BMI 48.66 kg/m  Physical Exam Gen: NAD, alert, cooperative with exam, well-appearing ENT: normal lips, normal nasal mucosa,  Eye: normal EOM, normal conjunctiva and lids CV:  no edema, +2 pedal pulses   Resp: no accessory muscle use, non-labored,  Skin: no rashes, no areas of induration  Neuro: normal tone, normal sensation to touch Psych:  normal insight, alert and oriented MSK:  Right knee: Limited extension secondary to pain. Limited flexion to about 100 degrees. Small effusion. Tenderness palpation over the patellar tendon. Able to flex and extend actively. Exam limited secondary to pain. Left knee: No obvious effusion. Normal range of motion. Normal strength resistance. Tenderness palpation over the medial joint line and patellar tendon. No instability. Neurovascular intact  Limited ultrasound: Right knee:  Moderate effusion within the suprapatellar pouch. Quad and patellar tendon are intact. Moderate medial joint line arthritic change. No fracture appreciated  Summary: Effusion with moderate degenerative changes of the medial joint line  Ultrasound and interpretation by Clare Gandy, MD      ASSESSMENT & PLAN:   Acute pain of right knee Effusion and pain likely result of a flare of her underlying arthritis.  Independent review of the x-ray did not demonstrate a fracture.  Possible to have a meniscal injury as well.  Exam limited  today -Provided Vimovo samples.  Sent in Duexis and Pennsaid. -Continue the brace -X-ray. -Counseled on ice -Follow-up in 1 week to reevaluate

## 2018-05-06 ENCOUNTER — Telehealth: Payer: Self-pay | Admitting: Family Medicine

## 2018-05-06 NOTE — Telephone Encounter (Signed)
Left VM for patient. If she calls back please have her speak with a nurse/CMA and inform that her xray doesn't show a fracture. The [facility] can report results to patient.   If any questions then please take the best time and phone number to call and I will try to call her back.   Myra Rude, MD [facility] 05/06/2018, 10:18 AM

## 2018-05-14 ENCOUNTER — Other Ambulatory Visit (HOSPITAL_COMMUNITY): Payer: Self-pay | Admitting: Interventional Radiology

## 2018-05-14 DIAGNOSIS — I639 Cerebral infarction, unspecified: Secondary | ICD-10-CM

## 2018-05-18 ENCOUNTER — Ambulatory Visit: Payer: BC Managed Care – PPO | Admitting: Family Medicine

## 2018-05-18 ENCOUNTER — Encounter: Payer: Self-pay | Admitting: Family Medicine

## 2018-05-18 DIAGNOSIS — M25561 Pain in right knee: Secondary | ICD-10-CM | POA: Diagnosis not present

## 2018-05-18 NOTE — Progress Notes (Signed)
Debra [name] - 43 y.o. female MRN [id number]  Date of birth: [birth year]  SUBJECTIVE:  Including CC & ROS.  Chief Complaint  Patient presents with  . Follow-up    f/u right leg pain/ a lot better-- still some swelling/ stopped dicolfenac may be allergic    Debra [name] is a 43 y.o. female that is is following up for right knee pain.  She reports having significant improvement in the pain and range of motion.  She is able to walk.  The pain is intermittent and mild.  Has had a mild reaction with the Pennsaid were caused her skin to be red.  Has quit taking the Vimovo on a regular basis and takes it as needed.  Denies any mechanical symptoms.  Still feels pain anteriorly..  Independent review of the right knee x-ray from 1/14 shows degenerative changes medially and patellofemoral knee.   Review of Systems  Constitutional: Negative for fever.  HENT: Negative for congestion.   Respiratory: Negative for cough.   Cardiovascular: Negative for chest pain.  Gastrointestinal: Negative for abdominal pain.  Musculoskeletal: Positive for arthralgias.  Skin: Negative for color change.  Neurological: Negative for weakness.  Hematological: Negative for adenopathy.  Psychiatric/Behavioral: Negative for agitation.    HISTORY: Past Medical, Surgical, Social, and Family History Reviewed & Updated per EMR.   Pertinent Historical Findings include:  Past Medical History:  Diagnosis Date  . Allergic rhinitis, cause unspecified   . Asthma   . Diabetes mellitus type II 08/2009 dx  . Endometriosis   . Hypertension   . Knee pain, right    DJD, post traumatic with repeat falls  . Morbid obesity (HCC)    s/p lab band 09/2010  . Ovarian cyst    right s/p resection June 2013  . Stroke [facility])     Past Surgical History:  Procedure Laterality Date  . ABDOMINAL HYSTERECTOMY  05/14/12   [place]  . IR ANGIO INTRA EXTRACRAN SEL COM CAROTID INNOMINATE BILAT MOD SED  07/04/2017  . IR ANGIO  VERTEBRAL SEL VERTEBRAL BILAT MOD SED  07/04/2017  . IR INTRA CRAN STENT  07/07/2017  . IR RADIOLOGIST EVAL & MGMT  08/13/2017  . LAPAROSCOPIC GASTRIC BANDING  10/02/2010   start weight 349#  . RADIOLOGY WITH ANESTHESIA N/A 07/07/2017   Procedure: STENTING;  Surgeon: Julieanne Cotton, MD;  Location: [facility] OR;  Service: Radiology;  Laterality: N/A;  . SALPINGOOPHORECTOMY  10/08/11   right    Allergies  Allergen Reactions  . Influenza Vaccines   . Metformin And Related Diarrhea  . Morphine Hives  . Augmentin [Amoxicillin-Pot Clavulanate] Rash    To bilat post hands only    Family History  Problem Relation Age of Onset  . Heart disease Mother   . Hypertension Mother   . Diabetes Mother   . Stroke Father 70  . Diabetes Father      Social History   Socioeconomic History  . Marital status: Single    Spouse name: n/a  . Number of children: 0  . Years of education: 12+  . Highest education level: Not on file  Occupational History  . Occupation: school bus driver  Social Needs  . Financial resource strain: Not on file  . Food insecurity:    Worry: Not on file    Inability: Not on file  . Transportation needs:    Medical: Not on file    Non-medical: Not on file  Tobacco Use  . Smoking  status: Never Smoker  . Smokeless tobacco: Never Used  Substance and Sexual Activity  . Alcohol use: No    Alcohol/week: 0.0 standard drinks  . Drug use: No  . Sexual activity: Not on file  Lifestyle  . Physical activity:    Days per week: Not on file    Minutes per session: Not on file  . Stress: Not on file  Relationships  . Social connections:    Talks on phone: Not on file    Gets together: Not on file    Attends religious service: Not on file    Active member of club or organization: Not on file    Attends meetings of clubs or organizations: Not on file    Relationship status: Not on file  . Intimate partner violence:    Fear of current or ex partner: Not on file    Emotionally  abused: Not on file    Physically abused: Not on file    Forced sexual activity: Not on file  Other Topics Concern  . Not on file  Social History Narrative   Single, lives with mom.    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   Completed some college.     PHYSICAL EXAM:  VS: BP 108/80   Pulse 65   Temp 98.4 F (36.9 C) (Oral)   Ht 5\' 8"  (1.727 m)   Wt (!) 326 lb 6.4 oz (148.1 kg)   LMP 07/28/2011   SpO2 98%   BMI 49.63 kg/m  Physical Exam Gen: NAD, alert, cooperative with exam, well-appearing ENT: normal lips, normal nasal mucosa,  Eye: normal EOM, normal conjunctiva and lids CV:  no edema, +2 pedal pulses   Resp: no accessory muscle use, non-labored,  Skin: no rashes, no areas of induration  Neuro: normal tone, normal sensation to touch Psych:  normal insight, alert and oriented MSK:  Right knee: Mild effusion. Mild tenderness the medial joint line. Normal range of motion. Normal strength resistance. No instability with valgus or varus stress testing. Negative McMurray's test. Neurovascular intact     ASSESSMENT & PLAN:   Acute pain of right knee Has had significant improvement of her pain.  Unclear if she had a small meniscal injury or an exacerbation of underlying arthritis. -Counseled on the use of Pennsaid -Counseled on home exercise therapy and supportive care -If no improvement can consider injection.

## 2018-05-18 NOTE — Patient Instructions (Signed)
Good to see you  Continue the pennsaid but don't rub it in  Please try the exercises  Please see me back if the pain fails to improve

## 2018-05-19 NOTE — Assessment & Plan Note (Signed)
Has had significant improvement of her pain.  Unclear if she had a small meniscal injury or an exacerbation of underlying arthritis. -Counseled on the use of Pennsaid -Counseled on home exercise therapy and supportive care -If no improvement can consider injection.

## 2018-05-20 ENCOUNTER — Encounter: Payer: Self-pay | Admitting: Adult Health

## 2018-05-20 ENCOUNTER — Ambulatory Visit: Payer: BC Managed Care – PPO | Admitting: Adult Health

## 2018-05-20 VITALS — BP 130/87 | HR 63 | Ht 68.0 in | Wt 323.2 lb

## 2018-05-20 DIAGNOSIS — I63512 Cerebral infarction due to unspecified occlusion or stenosis of left middle cerebral artery: Secondary | ICD-10-CM

## 2018-05-20 DIAGNOSIS — I10 Essential (primary) hypertension: Secondary | ICD-10-CM

## 2018-05-20 DIAGNOSIS — E1159 Type 2 diabetes mellitus with other circulatory complications: Secondary | ICD-10-CM

## 2018-05-20 DIAGNOSIS — E785 Hyperlipidemia, unspecified: Secondary | ICD-10-CM

## 2018-05-20 DIAGNOSIS — I6603 Occlusion and stenosis of bilateral middle cerebral arteries: Secondary | ICD-10-CM

## 2018-05-20 DIAGNOSIS — Z794 Long term (current) use of insulin: Secondary | ICD-10-CM

## 2018-05-20 NOTE — Patient Instructions (Addendum)
Continue aspirin 81 mg daily and Brilinta  and atorvastatin for secondary stroke prevention  Continue to follow up with PCP regarding cholesterol, blood pressure and diabetes management   We will check cholesterol level today to ensure adequate management with dose of statin   Continue to follow with vascular surgery Dr. Corliss Skains on 05/27/18  Referral will be placed for healthy weight and wellness clinic   Increase activity level and maintain a healthy diet  Continue to monitor blood pressure at home  Maintain strict control of hypertension with blood pressure goal below 130/90, diabetes with hemoglobin A1c goal below 6.5% and cholesterol with LDL cholesterol (bad cholesterol) goal below 70 mg/dL. I also advised the patient to eat a healthy diet with plenty of whole grains, cereals, fruits and vegetables, exercise regularly and maintain ideal body weight.  Followup in the future with me in 6 months or call earlier if needed       Thank you for coming to see [name] at [facility]. I hope we have been able to provide you high quality care today.  You may receive a patient satisfaction survey over the next few weeks. We would appreciate your feedback and comments so that we may continue to improve ourselves and the health of our patients.

## 2018-05-20 NOTE — Progress Notes (Signed)
STROKE NEUROLOGY FOLLOW UP NOTE  NAME: Debra [name] DOB: May 04, 1975  REASON FOR VISIT: stroke follow up HISTORY FROM: pt and chart  Today we had the pleasure of seeing Debra [name] in follow-up at our Neurology Clinic. Pt was accompanied by mom.   History Summary Debra [name] is a 43 y.o. female with history of HTN, type 2 DM, morbid obesity, endometriosis/ovarian cyst s/p hysterectomy/SBO and seasonal allergies admitted on 06/30/17 for slurred speech and expressive aphasia.  CT head showed small cortical hypoattenuation within the left parietal occipital junction possible acute infarct. CTA head and neck on admission multiple anterior and posterior stenosis, and MRI showed a cluster of acute infarct posterior left temporal lobe and temporoparietal junction region in the left MCA territory.  After admission, patient had fluctuating left MCA syndrome especially with positional changes and BP changes.  Repeat CT showed worsening stable left medial occipital lobe infarct.  Repeat CT head and neck showed unchanged bilateral high-grade M2 stenosis.  CT perfusion with small perfusion deficit, not amendable to intervention.  Repeat MRI showed patchy acute infarct in the left posterior MCA and watershed distributions.  Underwent cerebral angiogram on 07/04/2017, found to have left MCA inferior division >75% stenosis and right M1 80% stenosis, no vasculitis but atherosclerosis.  At end of procedure patient had a vasovagal event while holding pressure at the right groin.  BP dropped and she again had left MCA syndrome.  She was given IV bolus, put into Trendelenburg position and treated with pressors.  BP improved and symptoms resolved.  She was admitted in ICU after procedure, continues to have mild expressive aphasia but motor function at baseline.  LDL 129, and A1c 8.5.  UDS negative.  Hypercoagulable work-up negative.  She received left MCA stenting on 07/07/2017.  Her symptoms improved and was  discharged home on 07/08/2017 with home health PT/speech.  Home medication including aspirin, Brilinta and Lipitor 80.  08/18/17 visit JX: During the interval time, the patient has been doing well. Still has mild expressive aphasia but much improve. She still has HH speech and PT. She followed with Dr. Corliss Skains on 08/14/17 and P2Y12 was low. So her brilinta was decreased in half.  Next visit in 3 months. Continued on ASA 81 and lipitor. She stated that her BP and glucose controlled well at home. Today BP 135/79.   11/17/17 UPDATE: Patient is being seen today for follow-up and is accompanied by her mother.  She states her speech continues to improve and only has occasional episodes of difficulty getting certain words out with possible stuttering but this occurs more frequently when she is tired, upset or excited.  She does continue to do home speech exercises.  She also has intermittent right arm tingling but this also has become less frequent.  She continues to take both aspirin and Brilinta with bruising but no bleeding.  Continues to take Lipitor but does endorse hip pain that has been new since starting Lipitor but states she did not realize this could be a possible side effect.  Cholesterol levels have not been rechecked since hospital admission.  Blood pressure satisfactory at 143/89.  Patient does continue to monitor this at home.  She also continues to check glucose levels which she states has been improving since her recent adjustment by PCP.  Patient continues to stay active with her church community and attempts to maintain a healthy diet.  She will be having repeat CTA head/neck that was ordered by Dr.  Deveshwar 11/19/2017 for follow-up of stent placement and assessment of right stenosis.  Patient denies new or worsening stroke/TIA symptoms.  Interval history 05/20/2018: Debra [name] is being seen today for scheduled follow-up visit.  She has been stable from a stroke standpoint with only intermittent  hesitant speech only during anxiety provoking situations but otherwise denies any other difficulty.  She did have follow-up appointment Dr. Corliss Skainsdeveshwar with repeat imaging on 11/19/2017 which showed progression of severe distal right M1 stenosis from prior imaging but did show left M1 and inferior division M2 stent widely patent and she has continued on aspirin and Brilinta with side effects of easy bruising but denies bleeding.  She plans on returning for repeat imaging on 05/27/18. Lipid panel obtained after prior appointment with LDL 47 therefore atorvastatin dose decreased from 80 mg to 40 mg due to reported statin side effects.  She does endorse improvement of her myalgias and continues on atorvastatin 40mg .  Blood pressure today satisfactory at 130/87.  Continues to monitor glucose levels with recent A1c showing improvement from prior A1c's at 9.2.  Denies new or worsening stroke/TIA symptoms.     REVIEW OF SYSTEMS: Full 14 system review of systems performed and notable only for those listed below and in HPI above, all others are negative:  Joint pain, joint swelling, fatigue and food allergies  The following represents the patient's updated allergies and side effects list: Allergies  Allergen Reactions  . Influenza Vaccines   . Metformin And Related Diarrhea  . Morphine Hives  . Augmentin [Amoxicillin-Pot Clavulanate] Rash    To bilat post hands only    The neurologically relevant items on the patient's problem list were reviewed on today's visit.  Neurologic Examination  A problem focused neurological exam (12 or more points of the single system neurologic examination, vital signs counts as 1 point, cranial nerves count for 8 points) was performed.  Blood pressure 130/87, pulse 63, height 5\' 8"  (1.727 m), weight (!) 323 lb 3.2 oz (146.6 kg), last menstrual period 07/28/2011.  General - Morbid obesity, well developed, pleasant middle-aged African-American female, in no apparent  distress.  Ophthalmologic - Sharp disc margins OU.   Cardiovascular - Regular rate and rhythm.  Mental Status -  Level of arousal and orientation to time, place, and person were intact. Language including naming, repetition, comprehension was assessed and found intact.  Unable to appreciate any language or speech difficulties during appointment Attention span and concentration were normal. Fund of Knowledge was assessed and was intact.  Cranial Nerves II - XII - II - Visual field intact OU. III, IV, VI - Extraocular movements intact. V - Facial sensation intact bilaterally. VII - Facial movement intact bilaterally. VIII - Hearing & vestibular intact bilaterally. X - Palate elevates symmetrically. XI - Chin turning & shoulder shrug intact bilaterally. XII - Tongue protrusion intact.  Motor Strength - The patient's strength was normal in all extremities and pronator drift was absent.  Bulk was normal and fasciculations were absent   Motor Tone - Muscle tone was assessed at the neck and appendages and was normal.  Reflexes - The patient's reflexes were 1+ in all extremities and she had no pathological reflexes.  Sensory - Light touch, temperature/pinprick, vibration and proprioception, and Romberg testing were assessed and were normal.    Coordination - The patient had normal movements in the hands and feet with no ataxia or dysmetria.  Tremor was absent.  Gait and Station - The patient's transfers, posture, gait, station,  and turns were observed as normal.    Data reviewed: I personally reviewed the images and agree with the radiology interpretations.  CT ANGIO HEAD W OR WO CONTRAST CT ANGIO NECK W OR WO CONTRAST 11/19/2017 IMPRESSION: CT head:  1. No acute intracranial process or abnormal enhancement of the brain identified. 2. Chronic left parietal region infarction.  CTA neck:  Patent carotid and vertebral arteries. No dissection, aneurysm, or hemodynamically  significant stenosis utilizing NASCET criteria.  CTA head:  1. Progression of severe distal right M1 stenosis from 03/19. 2. Left M1 and inferior division M2 stent is widely patent. 3. Stable mild-to-moderate bilateral proximal PCA stenosis. 4. No new large vessel occlusion, new segment of high-grade stenosis, aneurysm, or vascular malformation.      Assessment: Debra [name] ia a 43 year old female with left MCA territory infarct with left MCA angioplasty and stent placement on 06/30/17 secondary to large vessel disease. Vascular risk factors include HTN, HLD and DM.  She returns today for follow-up visit with excellent improvement of her expressive aphasia with only intermittent speech hesitancy with increased anxiety situations.   Plan:  - continue ASA 81mg  and brilinta 45mg  bid for stroke prevention and post stent  - continue to follow up with Dr. Corliss Skainseveshwar as scheduled with repeat imaging on 05/27/2018 and to determine the duration of ASA and brilinta.  - continue lipitor for stroke prevention -Repeat lipid panel with possible consideration of changing statin if LDL elevated -at this time, advised to continue atorvastatin 40 mg daily for secondary stroke prevention HLD management -Referral to healthy weight and wellness to assist with weight loss goals - check BP and glucose and record -Advised to continue to stay active and maintain a healthy diet - Follow up with your primary care physician for stroke risk factor modification. Recommend maintain blood pressure goal <130/80, diabetes with hemoglobin A1c goal below 7.0% and lipids with LDL cholesterol goal below 70 mg/dL.   Patient will follow up in 6 months or call earlier if needed  I spent more than 25 minutes of face to face time with the patient. Greater than 50% of time was spent in counseling and coordination of care. We discussed follow up with IR, continue medication as instructed,  check BP and glucose at home  George HughJessica  Allysia Ingles, [facility] SurgeryGNP-BC  [facility] [address] [place], [place] [id number]  Phone [phone] Fax [phone] Note: This document was prepared with digital dictation and possible smart phrase technology. Any transcriptional errors that result from this process are unintentional.

## 2018-05-21 ENCOUNTER — Other Ambulatory Visit: Payer: Self-pay | Admitting: Adult Health

## 2018-05-21 ENCOUNTER — Telehealth: Payer: Self-pay

## 2018-05-21 LAB — LIPID PANEL
Chol/HDL Ratio: 2.8 ratio (ref 0.0–4.4)
Cholesterol, Total: 156 mg/dL (ref 100–199)
HDL: 56 mg/dL (ref >39)
LDL CALC: 85 mg/dL (ref 0–99)
Triglycerides: 73 mg/dL (ref 0–149)
VLDL Cholesterol Cal: 15 mg/dL (ref 5–40)

## 2018-05-21 MED ORDER — ROSUVASTATIN CALCIUM 40 MG PO TABS: 40.0000 mg | ORAL_TABLET | Freq: Every day | ORAL | 3 refills | Status: DC

## 2018-05-21 MED ORDER — EZETIMIBE 10 MG PO TABS: 10.0000 mg | ORAL_TABLET | Freq: Every day | ORAL | 3 refills | Status: DC

## 2018-05-21 NOTE — Telephone Encounter (Signed)
-----   Message from George Hugh, NP sent at 05/21/2018  6:59 AM EST ----- Please advise patient that her bad cholesterol LDL continues to be elevated at 85 with goal less than 70.  As she was unable to tolerate high intensity atorvastatin, recommend discontinuing and initiate Crestor 40 mg daily.

## 2018-05-21 NOTE — Telephone Encounter (Signed)
I called patient that her bad cholesterol LDL continues to be elevated at 85 with goal less than 70. I stated per Shanda Bumps NP she unable to tolerate high intensity atorvastatin, recommend discontinuing and initiate Crestor 40 mg daily.I stated medication was sent to the pharmacy today. Pt verbalized understanding.

## 2018-05-21 NOTE — Progress Notes (Signed)
I agree with the above plan 

## 2018-05-21 NOTE — Addendum Note (Signed)
Addended by: George Hugh on: 05/21/2018 07:00 AM   Modules accepted: Orders

## 2018-05-22 ENCOUNTER — Encounter: Payer: Self-pay | Admitting: Adult Health

## 2018-05-27 ENCOUNTER — Ambulatory Visit (HOSPITAL_COMMUNITY): Payer: BC Managed Care – PPO

## 2018-05-27 ENCOUNTER — Ambulatory Visit (HOSPITAL_COMMUNITY)
Admission: RE | Admit: 2018-05-27 | Discharge: 2018-05-27 | Disposition: A | Discharge status: Home / Self Care | Payer: BC Managed Care – PPO | Source: Ambulatory Visit | Attending: Interventional Radiology | Admitting: Interventional Radiology

## 2018-05-27 DIAGNOSIS — I639 Cerebral infarction, unspecified: Secondary | ICD-10-CM | POA: Diagnosis not present

## 2018-05-27 MED ORDER — IOPAMIDOL (ISOVUE-370) INJECTION 76%
INTRAVENOUS | Status: AC
  Administered 2018-05-27: 16:00:00
  Filled 2018-05-27: qty 100

## 2018-06-01 ENCOUNTER — Other Ambulatory Visit (HOSPITAL_COMMUNITY): Payer: Self-pay | Admitting: Interventional Radiology

## 2018-06-01 DIAGNOSIS — I771 Stricture of artery: Secondary | ICD-10-CM

## 2018-06-09 ENCOUNTER — Telehealth (HOSPITAL_COMMUNITY): Payer: Self-pay

## 2018-06-09 NOTE — Telephone Encounter (Signed)
Called to reschedule consult, no answer, left vm. AW  

## 2018-06-11 ENCOUNTER — Ambulatory Visit (HOSPITAL_COMMUNITY): Payer: BC Managed Care – PPO

## 2018-06-18 ENCOUNTER — Other Ambulatory Visit: Payer: Self-pay | Admitting: Internal Medicine

## 2018-06-22 ENCOUNTER — Ambulatory Visit
Admission: RE | Admit: 2018-06-22 | Discharge: 2018-06-22 | Disposition: A | Discharge status: Home / Self Care | Payer: BC Managed Care – PPO | Source: Ambulatory Visit | Attending: Obstetrics and Gynecology | Admitting: Obstetrics and Gynecology

## 2018-06-22 ENCOUNTER — Ambulatory Visit: Payer: BC Managed Care – PPO

## 2018-06-22 DIAGNOSIS — N6320 Unspecified lump in the left breast, unspecified quadrant: Secondary | ICD-10-CM

## 2018-06-23 ENCOUNTER — Ambulatory Visit (HOSPITAL_COMMUNITY): Admission: RE | Admit: 2018-06-23 | Payer: BC Managed Care – PPO | Source: Ambulatory Visit

## 2018-06-23 ENCOUNTER — Encounter: Payer: Self-pay | Admitting: Internal Medicine

## 2018-06-25 ENCOUNTER — Encounter (INDEPENDENT_AMBULATORY_CARE_PROVIDER_SITE_OTHER): Payer: BC Managed Care – PPO

## 2018-06-26 ENCOUNTER — Ambulatory Visit (HOSPITAL_COMMUNITY)
Admission: RE | Admit: 2018-06-26 | Discharge: 2018-06-26 | Disposition: A | Discharge status: Home / Self Care | Payer: BC Managed Care – PPO | Source: Ambulatory Visit | Attending: Interventional Radiology | Admitting: Interventional Radiology

## 2018-06-26 DIAGNOSIS — I771 Stricture of artery: Secondary | ICD-10-CM

## 2018-06-26 NOTE — Progress Notes (Signed)
Chief Complaint: Patient was seen in consultation today for follow up (L)MCA stenting at the request of Deveshwar,Sanjeev  Referring Physician(s): Deveshwar,Sanjeev  Supervising Physician: Luanne Bras  Patient Status: [facility] - Out-pt  History of Present Illness: Debra [name] is a 43 y.o. female who is here for follow up after CTA head. She has hx of CVA with (L)MCA occlusion requiring revasc and stenting procedure. She has really done quite well since the event last year. She believes her speech and motor strength have come back fully.  She continues to take ASA 60m daily and Brilinta 479mbid. She reports no new sxs. She stays on top of her BP, cholesterol, and glucose management. Denies smoking and is also working on an exercise program. She had recent CTA.  Past Medical History:  Diagnosis Date  . Allergic rhinitis, cause unspecified   . Asthma   . Diabetes mellitus type II 08/2009 dx  . Endometriosis   . Hypertension   . Knee pain, right    DJD, post traumatic with repeat falls  . [place] ([place]   s/p lab band 09/2010  . Ovarian cyst    right s/p resection June 2013  . [facility]    Past Surgical History:  Procedure Laterality Date  . ABDOMINAL HYSTERECTOMY  05/14/12   [place]. IR ANGIO INTRA EXTRACRAN SEL COM CAROTID INNOMINATE BILAT MOD SED  07/04/2017  . IR ANGIO VERTEBRAL SEL VERTEBRAL BILAT MOD SED  07/04/2017  . IR INTRA CRAN STENT  07/07/2017  . IR RADIOLOGIST EVAL & MGMT  08/13/2017  . LAPAROSCOPIC GASTRIC BANDING  10/02/2010   start weight 349#  . RADIOLOGY WITH ANESTHESIA N/A 07/07/2017   Procedure: STENTING;  Surgeon: DeLuanne BrasMD;  [facility] [facility] [place];  Laterality: N/A;  . SALPINGOOPHORECTOMY  10/08/11   right    Allergies: Influenza vaccines; Metformin and related; Morphine; and Augmentin [amoxicillin-pot clavulanate]  Medications:  Current Outpatient Medications:  .  albuterol (PROAIR  HFA) 108 (90 BASE) MCG/ACT inhaler, Inhale 2 puffs into the lungs every 4 (four) hours as needed for wheezing or shortness of breath., Disp: 18 g, Rfl: 2 .  albuterol (PROVENTIL) (2.5 MG/3ML) 0.083% nebulizer solution, Take 2.5 mg by nebulization every 6 (six) hours as needed for wheezing or shortness of breath., Disp: , Rfl:  .  amLODipine (NORVASC) 10 MG tablet, Take 1 tablet (10 mg total) by mouth daily., Disp: 90 tablet, Rfl: 3 .  aspirin EC 81 MG tablet, Take 81 mg by mouth daily., Disp: , Rfl:  .  blood glucose meter kit and supplies, Dispense based on patient and insurance preference. Use up to four times daily as directed. (FOR ICD-9 250.00, 250.01)., Disp: 1 each, Rfl: 0 .  Diclofenac Sodium (PENNSAID) 2 % SOLN, Place 1 application onto the skin 2 (two) times daily., Disp: 1 Bottle, Rfl: 3 .  Docusate Sodium (COLACE PO), Take by mouth., Disp: , Rfl:  .  empagliflozin (JARDIANCE) 25 MG TABS tablet, Take 25 mg by mouth daily., Disp: 90 tablet, Rfl: 1 .  fexofenadine (ALLEGRA) 180 MG tablet, Take 180 mg by mouth daily as needed. For seasonal allergies, Disp: , Rfl:  .  fluconazole (DIFLUCAN) 150 MG tablet, Take 1 tablet (150 mg total) by mouth daily., Disp: 10 tablet, Rfl: 0 .  fluticasone (FLONASE) 50 MCG/ACT nasal spray, Place 2 sprays into both nostrils daily., Disp: 16 g, Rfl: 6 .  glipiZIDE (GLUCOTROL) 5 MG  tablet, TAKE 1 TABLET(5 MG) BY MOUTH TWICE DAILY BEFORE A MEAL, Disp: 180 tablet, Rfl: 1 .  glucose blood (ONE TOUCH ULTRA TEST) test strip, Use as directed, Disp: 100 each, Rfl: 2 .  glucose blood test strip, Used to check blood sugars up to four times daily as needed, Disp: 200 each, Rfl: 3 .  ibuprofen (ADVIL,MOTRIN) 800 MG tablet, ibuprofen 800 mg tablet, Disp: , Rfl:  .  Ibuprofen-Famotidine 800-26.6 MG TABS, Take 1 tablet by mouth 3 (three) times daily., Disp: 90 tablet, Rfl: 3 .  Insulin Pen Needle (B-D ULTRAFINE III SHORT PEN) 31G X 8 MM MISC, USE AS DIRECTED TO INJECT INSULIN.,  Disp: 100 each, Rfl: 0 .  Lancets MISC, Used to check blood sugars up to four times daily as needed, Disp: 200 each, Rfl: 3 .  losartan (COZAAR) 100 MG tablet, TAKE 1 TABLET(100 MG) BY MOUTH DAILY, Disp: 90 tablet, Rfl: 1 .  nystatin-triamcinolone ointment (MYCOLOG), Apply 1 application topically 2 (two) times daily., Disp: 60 g, Rfl: 0 .  olopatadine (PATANOL) 0.1 % ophthalmic solution, Apply to eye., Disp: , Rfl:  .  rosuvastatin (CRESTOR) 40 MG tablet, Take 1 tablet (40 mg total) by mouth daily., Disp: 30 tablet, Rfl: 3 .  ticagrelor (BRILINTA) 90 MG TABS tablet, Take 0.5 tablets (45 mg total) by mouth 2 (two) times daily., Disp: 60 tablet, Rfl: 0    Family History  Problem Relation Age of Onset  . Heart disease Mother   . Hypertension Mother   . Diabetes Mother   . Stroke Father 84  . Diabetes Father     Social History   Socioeconomic History  . Marital status: Single    Spouse name: n/a  . Number of children: 0  . Years of education: 12+  . Highest education level: Not on file  Occupational History  . Occupation: school bus driver  Social Needs  . Financial resource strain: Not on file  . Food insecurity:    Worry: Not on file    Inability: Not on file  . Transportation needs:    Medical: Not on file    Non-medical: Not on file  Tobacco Use  . Smoking status: Never Smoker  . Smokeless tobacco: Never Used  Substance and Sexual Activity  . Alcohol use: No    Alcohol/week: 0.0 standard drinks  . Drug use: No  . Sexual activity: Not on file  Lifestyle  . Physical activity:    Days per week: Not on file    Minutes per session: Not on file  . Stress: Not on file  Relationships  . Social connections:    Talks on phone: Not on file    Gets together: Not on file    Attends religious service: Not on file    Active member of club or organization: Not on file    Attends meetings of clubs or organizations: Not on file    Relationship status: Not on file  Other Topics  Concern  . Not on file  Social History Narrative   Single, lives with mom.    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   Completed some college.    Review of Systems: A 12 point ROS discussed and pertinent positives are indicated in the HPI above.  All other systems are negative.  Review of Systems  Vital Signs: LMP 07/28/2011   Physical Exam Constitutional:  Appearance: Normal appearance. She is obese. She is not ill-appearing.  HENT:     Mouth/Throat:     Mouth: Mucous membranes are moist.     Pharynx: Oropharynx is clear.  Cardiovascular:     Rate and Rhythm: Normal rate and regular rhythm.     Heart sounds: Normal heart sounds.  Pulmonary:     Effort: Pulmonary effort is normal. No respiratory distress.     Breath sounds: Normal breath sounds.  Skin:    General: Skin is warm and dry.  Neurological:     General: No focal deficit present.     Mental Status: She is alert and oriented to person, place, and time.     Cranial Nerves: No cranial nerve deficit.     Sensory: No sensory deficit.     Motor: No weakness.     Gait: Gait normal.  Psychiatric:        Mood and Affect: Mood normal.        Judgment: Judgment normal.     Imaging: Ct Angio Head W Or Wo Contrast  Result Date: 05/27/2018 CLINICAL DATA:  43 year old female status post left MCA territory infarcts in 2019, multiple advanced intracranial atherosclerotic stenoses. Left MCA stenting. Severe distal right M1 stenosis. EXAM: CT ANGIOGRAPHY HEAD AND NECK TECHNIQUE: Multidetector CT imaging of the head and neck was performed using the standard protocol during bolus administration of intravenous contrast. Multiplanar CT image reconstructions and MIPs were obtained to evaluate the vascular anatomy. Carotid stenosis measurements (when applicable) are obtained utilizing NASCET criteria, using the distal internal carotid diameter as the  denominator. CONTRAST:  75 milliliters Isovue 370 ISOVUE-370 IOPAMIDOL (ISOVUE-370) INJECTION 76% COMPARISON:  CTA head and neck 11/19/2017 and earlier. FINDINGS: CT HEAD Brain: Chronic encephalomalacia in the posterior left MCA territory. Stable cerebral volume since 2019. Stable gray-white matter differentiation throughout the brain. No midline shift, ventriculomegaly, mass effect, evidence of mass lesion, intracranial hemorrhage or evidence of cortically based acute infarction. Calvarium and skull base: No acute osseous abnormality identified. Paranasal sinuses: Visualized paranasal sinuses and mastoids are stable and well pneumatized. Orbits: Visualized orbits and scalp soft tissues are within normal limits. CTA NECK Skeleton: No acute osseous abnormality identified. Upper chest: Large body habitus, otherwise negative. Other neck: Stable mild thyromegaly.  Otherwise negative. Aortic arch: 3 vessel arch configuration with no arch or proximal great vessel atherosclerosis. Right carotid system: Right CCA origin less visible today due to dense right subclavian vein contrast. Minimal calcified plaque at the posterior right ICA origin. No stenosis. Left carotid system: Negative. Vertebral arteries: Proximal subclavian arteries and cervical vertebral arteries are negative. CTA HEAD Posterior circulation: Codominant distal vertebral arteries and basilar arteries are negative. Patent PICA, AICA and SCA origins. Patent PCA origins. Mild bilateral P1 segment irregularity and stenosis appears stable (series 10, image 21). Generalized mild to moderate bilateral PCA branch irregularity is stable and perhaps greater on the left. Anterior circulation: Both ICA siphons remain patent with no siphon stenosis. Normal left ophthalmic and posterior communicating artery origins. There is minimal calcified plaque of the right supraclinoid segment. Normal right ophthalmic artery origin. Right posterior communicating artery is diminutive  or absent. Patent carotid termini. Normal MCA and ACA origins. Diminutive or absent anterior communicating artery. Mild bilateral ACA branch irregularity is stable on series 12, image 22. The proximal right MCA M1 remains normal, the with severe M2 branch stenoses at the right MCA bifurcation (series 11, image 18. Stenosis of the dominant posterior right M2 branch  appears stable to perhaps mildly improved on series 10, image 22 (compare to series 17, image 23 in 2019). Otherwise right MCA branches are stable. Left MCA stent extending from the M1 into the posterior M2 branch is patent and left MCA branches appear stable since 2019. There is mild left M3 branch irregularity. Venous sinuses: Patent on the delayed images. Anatomic variants: None. Delayed phase: No abnormal enhancement identified. Review of the MIP images confirms the above findings IMPRESSION: 1. Stable and patent left MCA stent and left MCA branches. 2. Severe irregularity and stenosis at the right MCA bifurcation is stable or minimally improved since the CTA on 11/19/2017. 3. Mild to moderate bilateral PCA and ACA branch irregularity and stenosis is stable. 4. Minimal atherosclerosis in the neck and at the skull base. 5. Stable CT appearance of the brain with chronic encephalomalacia in the posterior left MCA territory. Electronically Signed   By: Genevie Ann M.D.   On: 05/27/2018 17:14   Ct Angio Neck W Or Wo Contrast  Result Date: 05/27/2018 CLINICAL DATA:  43 year old female status post left MCA territory infarcts in 2019, multiple advanced intracranial atherosclerotic stenoses. Left MCA stenting. Severe distal right M1 stenosis. EXAM: CT ANGIOGRAPHY HEAD AND NECK TECHNIQUE: Multidetector CT imaging of the head and neck was performed using the standard protocol during bolus administration of intravenous contrast. Multiplanar CT image reconstructions and MIPs were obtained to evaluate the vascular anatomy. Carotid stenosis measurements (when  applicable) are obtained utilizing NASCET criteria, using the distal internal carotid diameter as the denominator. CONTRAST:  75 milliliters Isovue 370 ISOVUE-370 IOPAMIDOL (ISOVUE-370) INJECTION 76% COMPARISON:  CTA head and neck 11/19/2017 and earlier. FINDINGS: CT HEAD Brain: Chronic encephalomalacia in the posterior left MCA territory. Stable cerebral volume since 2019. Stable gray-white matter differentiation throughout the brain. No midline shift, ventriculomegaly, mass effect, evidence of mass lesion, intracranial hemorrhage or evidence of cortically based acute infarction. Calvarium and skull base: No acute osseous abnormality identified. Paranasal sinuses: Visualized paranasal sinuses and mastoids are stable and well pneumatized. Orbits: Visualized orbits and scalp soft tissues are within normal limits. CTA NECK Skeleton: No acute osseous abnormality identified. Upper chest: Large body habitus, otherwise negative. Other neck: Stable mild thyromegaly.  Otherwise negative. Aortic arch: 3 vessel arch configuration with no arch or proximal great vessel atherosclerosis. Right carotid system: Right CCA origin less visible today due to dense right subclavian vein contrast. Minimal calcified plaque at the posterior right ICA origin. No stenosis. Left carotid system: Negative. Vertebral arteries: Proximal subclavian arteries and cervical vertebral arteries are negative. CTA HEAD Posterior circulation: Codominant distal vertebral arteries and basilar arteries are negative. Patent PICA, AICA and SCA origins. Patent PCA origins. Mild bilateral P1 segment irregularity and stenosis appears stable (series 10, image 21). Generalized mild to moderate bilateral PCA branch irregularity is stable and perhaps greater on the left. Anterior circulation: Both ICA siphons remain patent with no siphon stenosis. Normal left ophthalmic and posterior communicating artery origins. There is minimal calcified plaque of the right  supraclinoid segment. Normal right ophthalmic artery origin. Right posterior communicating artery is diminutive or absent. Patent carotid termini. Normal MCA and ACA origins. Diminutive or absent anterior communicating artery. Mild bilateral ACA branch irregularity is stable on series 12, image 22. The proximal right MCA M1 remains normal, the with severe M2 branch stenoses at the right MCA bifurcation (series 11, image 18. Stenosis of the dominant posterior right M2 branch appears stable to perhaps mildly improved on series 10, image 22 (  compare to series 17, image 23 in 2019). Otherwise right MCA branches are stable. Left MCA stent extending from the M1 into the posterior M2 branch is patent and left MCA branches appear stable since 2019. There is mild left M3 branch irregularity. Venous sinuses: Patent on the delayed images. Anatomic variants: None. Delayed phase: No abnormal enhancement identified. Review of the MIP images confirms the above findings IMPRESSION: 1. Stable and patent left MCA stent and left MCA branches. 2. Severe irregularity and stenosis at the right MCA bifurcation is stable or minimally improved since the CTA on 11/19/2017. 3. Mild to moderate bilateral PCA and ACA branch irregularity and stenosis is stable. 4. Minimal atherosclerosis in the neck and at the skull base. 5. Stable CT appearance of the brain with chronic encephalomalacia in the posterior left MCA territory. Electronically Signed   By: Genevie Ann M.D.   On: 05/27/2018 17:14   Mm Diag Breast Tomo Bilateral  Result Date: 06/22/2018 CLINICAL DATA:  43 year old patient presents for annual examination and follow-up of probably benign fat necrosis in the left breast. EXAM: DIGITAL DIAGNOSTIC BILATERAL MAMMOGRAM WITH CAD AND TOMO COMPARISON:  June 10 2017, June 17, 2017, December 17, 2017 ACR Breast Density Category a: The breast tissue is almost entirely fatty. FINDINGS: Interval resolution of the previously described mass in the  upper inner left breast. No mass, architectural distortion, or suspicious microcalcification is identified in either breast to suggest malignancy. Mammographic images were processed with CAD. IMPRESSION: Previously described left breast mass has resolved, consistent with resolution of benign fat necrosis. No evidence of malignancy in either breast. RECOMMENDATION: Screening mammogram in one year.(Code:SM-B-01Y) I have discussed the findings and recommendations with the patient. Results were also provided in writing at the conclusion of the visit. If applicable, a reminder letter will be sent to the patient regarding the next appointment. BI-RADS CATEGORY  1: Negative. Electronically Signed   By: Curlene Dolphin M.D.   On: 06/22/2018 14:09    Labs:  CBC: Recent Labs    07/07/17 0443 07/08/17 0607 07/09/17 0606 01/05/18 1013  WBC 9.2 9.9 9.3 8.7  HGB 11.8* 11.0* 11.1* 13.0  HCT 37.2 34.6* 34.8* 40.2  PLT 239 247 267 314.0    COAGS: Recent Labs    06/30/17 2325 07/07/17 0443 07/07/17 1957  INR 1.06 1.06  --   APTT 27  --  72*    BMP: Recent Labs    07/05/17 0349 07/06/17 1201 07/08/17 0607 07/09/17 0606 11/19/17 1610 01/05/18 1013  NA 139 135 137 137  --  140  K 3.4* 3.7 3.2* 3.5  --  3.6  CL 108 108 107 105  --  104  CO2 19* 20* 20* 23  --  28  GLUCOSE 205* 315* 236* 177*  --  249*  BUN <5* 7 <5* <5*  --  9  CALCIUM 8.2* 7.7* 7.6* 8.2*  --  8.9  CREATININE 0.69 0.78 0.70 0.67 0.80 0.77  GFRNONAA >60 >60 >60 >60  --   --   GFRAA >60 >60 >60 >60  --   --     LIVER FUNCTION TESTS: Recent Labs    06/30/17 2325 01/05/18 1013  BILITOT 0.4 0.4  AST 16 12  ALT 18 17  ALKPHOS 60 103  PROT 7.1 6.4  ALBUMIN 3.6 3.5    TUMOR MARKERS: No results for input(s): AFPTM, CEA, CA199, CHROMGRNA in the last 8760 hours.  Assessment and Plan: Hx CVA s/p (L)MCA stent angioplasty  Known (R)MCA stenosis. Reviewed CTA today. (L)MCA stent widely patent.  (R)MCA stenosis  stable. Encouraged pt to continue risk factor control, follow up with PCP. Continue ASA and Brilinta as she is. Repeat CTA in 6 months.  Thank you for this interesting consult.  I greatly enjoyed meeting JADWIGA FAIDLEY and look forward to participating in their care.  A copy of this report was sent to the requesting provider on this date.  Electronically Signed: Ascencion Dike, PA-C 06/26/2018, 2:04 PM   I spent a total of 25 minutes in face to face in clinical consultation, greater than 50% of which was counseling/coordinating care for MCA stenosis

## 2018-07-01 ENCOUNTER — Ambulatory Visit (INDEPENDENT_AMBULATORY_CARE_PROVIDER_SITE_OTHER): Payer: BC Managed Care – PPO | Admitting: Family Medicine

## 2018-07-01 ENCOUNTER — Other Ambulatory Visit: Payer: Self-pay

## 2018-07-01 ENCOUNTER — Encounter (INDEPENDENT_AMBULATORY_CARE_PROVIDER_SITE_OTHER): Payer: Self-pay | Admitting: Family Medicine

## 2018-07-01 VITALS — BP 114/76 | HR 72 | Temp 97.8°F | Ht 67.0 in | Wt 322.0 lb

## 2018-07-01 DIAGNOSIS — Z9189 Other specified personal risk factors, not elsewhere classified: Secondary | ICD-10-CM

## 2018-07-01 DIAGNOSIS — R0602 Shortness of breath: Secondary | ICD-10-CM

## 2018-07-01 DIAGNOSIS — Z6841 Body Mass Index (BMI) 40.0 and over, adult: Secondary | ICD-10-CM

## 2018-07-01 DIAGNOSIS — Z0289 Encounter for other administrative examinations: Secondary | ICD-10-CM

## 2018-07-01 DIAGNOSIS — R5383 Other fatigue: Secondary | ICD-10-CM

## 2018-07-01 DIAGNOSIS — I10 Essential (primary) hypertension: Secondary | ICD-10-CM

## 2018-07-01 DIAGNOSIS — Z1331 Encounter for screening for depression: Secondary | ICD-10-CM | POA: Diagnosis not present

## 2018-07-01 DIAGNOSIS — E1165 Type 2 diabetes mellitus with hyperglycemia: Secondary | ICD-10-CM

## 2018-07-01 DIAGNOSIS — Z8673 Personal history of transient ischemic attack (TIA), and cerebral infarction without residual deficits: Secondary | ICD-10-CM

## 2018-07-01 DIAGNOSIS — E66813 Obesity, class 3: Secondary | ICD-10-CM

## 2018-07-02 LAB — COMPREHENSIVE METABOLIC PANEL
ALK PHOS: 109 IU/L (ref 39–117)
ALT: 19 IU/L (ref 0–32)
AST: 14 IU/L (ref 0–40)
Albumin/Globulin Ratio: 1.7 (ref 1.2–2.2)
Albumin: 4.2 g/dL (ref 3.8–4.8)
BUN/Creatinine Ratio: 13 (ref 9–23)
BUN: 10 mg/dL (ref 6–24)
Bilirubin Total: 0.3 mg/dL (ref 0.0–1.2)
CO2: 21 mmol/L (ref 20–29)
CREATININE: 0.75 mg/dL (ref 0.57–1.00)
Calcium: 9.2 mg/dL (ref 8.7–10.2)
Chloride: 103 mmol/L (ref 96–106)
GFR calc Af Amer: 112 mL/min/{1.73_m2} (ref >59)
GFR calc non Af Amer: 97 mL/min/{1.73_m2} (ref >59)
GLUCOSE: 173 mg/dL — AB (ref 65–99)
Globulin, Total: 2.5 g/dL (ref 1.5–4.5)
Potassium: 3.7 mmol/L (ref 3.5–5.2)
Sodium: 142 mmol/L (ref 134–144)
Total Protein: 6.7 g/dL (ref 6.0–8.5)

## 2018-07-02 LAB — MICROALBUMIN / CREATININE URINE RATIO
Creatinine, Urine: 67.6 mg/dL
Microalb/Creat Ratio: <4 mg/g creat (ref 0–29)
Microalbumin, Urine: <3 ug/mL

## 2018-07-02 LAB — CBC WITH DIFFERENTIAL
Basophils Absolute: 0.1 10*3/uL (ref 0.0–0.2)
Basos: 1 %
EOS (ABSOLUTE): 0.3 10*3/uL (ref 0.0–0.4)
Eos: 3 %
Hematocrit: 42.7 % (ref 34.0–46.6)
Hemoglobin: 13.5 g/dL (ref 11.1–15.9)
IMMATURE GRANULOCYTES: 0 %
Immature Grans (Abs): 0 10*3/uL (ref 0.0–0.1)
Lymphocytes Absolute: 2.2 10*3/uL (ref 0.7–3.1)
Lymphs: 22 %
MCH: 25.8 pg — ABNORMAL LOW (ref 26.6–33.0)
MCHC: 31.6 g/dL (ref 31.5–35.7)
MCV: 82 fL (ref 79–97)
MONOCYTES: 4 %
Monocytes Absolute: 0.4 10*3/uL (ref 0.1–0.9)
Neutrophils Absolute: 6.9 10*3/uL (ref 1.4–7.0)
Neutrophils: 70 %
RBC: 5.24 x10E6/uL (ref 3.77–5.28)
RDW: 13.7 % (ref 11.7–15.4)
WBC: 9.9 10*3/uL (ref 3.4–10.8)

## 2018-07-02 LAB — TSH: TSH: 1.62 u[IU]/mL (ref 0.450–4.500)

## 2018-07-02 LAB — INSULIN, RANDOM: INSULIN: 8.9 u[IU]/mL (ref 2.6–24.9)

## 2018-07-02 LAB — T4, FREE: Free T4: 1.3 ng/dL (ref 0.82–1.77)

## 2018-07-02 LAB — VITAMIN B12: VITAMIN B 12: 512 pg/mL (ref 232–1245)

## 2018-07-02 LAB — T3: T3, Total: 116 ng/dL (ref 71–180)

## 2018-07-02 LAB — FOLATE: Folate: 6.4 ng/mL (ref >3.0)

## 2018-07-02 LAB — VITAMIN D 25 HYDROXY (VIT D DEFICIENCY, FRACTURES): VIT D 25 HYDROXY: 22.6 ng/mL — AB (ref 30.0–100.0)

## 2018-07-02 NOTE — Progress Notes (Signed)
Office: [phone]  /  Fax: [phone]   Dear Venancio Poisson, NP,   Thank you for referring Debra [name] to our clinic. The following note includes my evaluation and treatment recommendations.  HPI:   Chief Complaint: OBESITY    [name] has been referred by Venancio Poisson, NP  for consultation regarding her obesity and obesity related comorbidities.    Debra [name] (MR# [id number]) is a 43 y.o. female who presents on 07/01/2018 for obesity evaluation and treatment. Current BMI is Body mass index is 50.43 kg/m. Debra [name] has been struggling with her weight for many years and has been unsuccessful in either losing weight, maintaining weight loss, or reaching her healthy weight goal.     Debra [name] has a history of lactose intolerance, she can tolerate cheese. She has a history of lap band surgery, and her restriction is that she can tolerate half a meal.     Debra [name] attended our information session and states she is currently in the action stage of change and ready to dedicate time achieving and maintaining a healthier weight. Debra [name] is interested in becoming our patient and working on intensive lifestyle modifications including (but not limited to) diet, exercise and weight loss.    Debra [name] states her family eats meals together she thinks her family will eat healthier with  her her desired weight loss is 72 lbs she has been heavy most of  her life she started gaining weight after high school her heaviest weight ever was 250 lbs she is a picky eater and doesn't like to eat healthier foods  she has significant food cravings issues  she snacks frequently in the evenings she wakes up frquently in the middle of the night to eat she is frequently drinking liquids with calories she frequently makes poor food choices she struggles with emotional eating    Fatigue Debra [name] feels her energy is lower than it should be. This has worsened with weight gain and has not  worsened recently. Debra [name] admits to daytime somnolence and  admits to waking up still tired. Patient is at risk for obstructive sleep apnea. Patent has a history of symptoms of daytime fatigue. Patient generally gets 6 hours of sleep per night, and states they generally have difficulty falling asleep. Snoring is present. Apneic episodes are not present. Epworth Sleepiness Score is 3.  Dyspnea on exertion Debra [name] notes increasing shortness of breath with exercising and seems to be worsening over time with weight gain. She notes getting out of breath sooner with activity than she used to. This has not gotten worse recently. EKG-normal sinus rhythm. Debra [name] denies orthopnea.  Hypertension Treniece CAROLLYN ETCHEVERRY is a 43 y.o. female with hypertension. Lynley's blood pressure is controlled. She has had this diagnosis for 4 years. She denies chest pain, chest pressure, or headaches. She is working on weight loss to help control her blood pressure with the goal of decreasing her risk of heart attack and stroke.   Diabetes II with Hyperglycemia Debra [name] has a diagnosis of diabetes type II. Debra [name] is on glipizide and Jardiance. She was previously on Trulicity (but it didn't help by itself), metformin (but caused stomach upset), and Januvia (but it was not enough by itself). She states fasting BGs range between 130's and 200. She denies hypoglycemia. Last A1c was 9.2. She has been working on intensive lifestyle modifications including diet, exercise, and weight loss to help control her blood glucose levels.  At risk for cardiovascular disease Debra [name] is at a higher than  average risk for cardiovascular disease due to obesity, hypertension, and diabetes II. She currently denies any chest pain.  Depression Screen Debra [name]'s Food and Mood (modified PHQ-9) score was  Depression screen PHQ 2/9 07/01/2018  Decreased Interest 1  Down, Depressed, Hopeless 0  PHQ - 2 Score 1  Altered sleeping 0  Tired, decreased energy 1   Change in appetite 1  Feeling bad or failure about yourself  0  Trouble concentrating 0  Moving slowly or fidgety/restless 0  Suicidal thoughts 0  PHQ-9 Score 3  Difficult doing work/chores Not difficult at all    ASSESSMENT AND PLAN:  Other fatigue - Plan: EKG 12-Lead, Vitamin B12, CBC With Differential, Folate, T3, T4, free, TSH, VITAMIN D 25 Hydroxy (Vit-D Deficiency, Fractures)  Shortness of breath on exertion  Essential hypertension  Type 2 diabetes mellitus with hyperglycemia, without long-term current use of insulin (HCC) - Plan: Comprehensive metabolic panel, Insulin, random  Status post CVA  Depression screening  At risk for heart disease  Class 3 severe obesity with serious comorbidity and body mass index (BMI) of 50.0 to 59.9 in adult, unspecified obesity type (HCC)  PLAN:  Fatigue Debra [name] was informed that her fatigue may be related to obesity, depression or many other causes. Labs will be ordered, and in the meanwhile Debra [name] has agreed to work on diet, exercise and weight loss to help with fatigue. Proper sleep hygiene was discussed including the need for 7-8 hours of quality sleep each night. A sleep study was not ordered based on symptoms and Epworth score.  Dyspnea on exertion Debra [name]'s shortness of breath appears to be obesity related and exercise induced. She has agreed to work on weight loss and gradually increase exercise to treat her exercise induced shortness of breath. If Debra [name] follows our instructions and loses weight without improvement of her shortness of breath, we will plan to refer to pulmonology. We will monitor this condition regularly. Debra [name] agrees to this plan.  Hypertension We discussed sodium restriction, working on healthy weight loss, and a regular exercise program as the means to achieve improved blood pressure control. Debra [name] agreed with this plan and agreed to follow up as directed. We will continue to monitor her blood pressure as  well as her progress with the above lifestyle modifications. Debra [name] will continue her medications as prescribed and will watch for signs of hypotension as she continues her lifestyle modifications. EKG was done, and we will check CMP today. Porshe agrees to follow up with our clinic in 2 weeks.  Diabetes II with Hyperglycemia Angelize has been given extensive diabetes education by myself today including ideal fasting and post-prandial blood glucose readings, individual ideal Hgb A1c goals and hypoglycemia prevention. We discussed the importance of good blood sugar control to decrease the likelihood of diabetic complications such as nephropathy, neuropathy, limb loss, blindness, coronary artery disease, and death. We discussed the importance of intensive lifestyle modification including diet, exercise and weight loss as the first line treatment for diabetes. Kirbi agrees to continue her diabetes medications and we will check insulin and Urine: MAB today. Dyneshia agrees to follow up with our clinic in 2 weeks.  Cardiovascular risk counseling Mirjana was given extended (15 minutes) coronary artery disease prevention counseling today. She is 43 y.o. female and has risk factors for heart disease including obesity, hypertension, and diabetes II. We discussed intensive lifestyle modifications today with an emphasis on specific weight loss instructions and strategies. Pt was also informed of the importance of increasing exercise and  decreasing saturated fats to help prevent heart disease.  Depression Screen Berneita had a negative depression screening. Depression is commonly associated with obesity and often results in emotional eating behaviors. We will monitor this closely and work on CBT to help improve the non-hunger eating patterns. Referral to Psychology may be required if no improvement is seen as she continues in our clinic.  Obesity Mayzee is currently in the action stage of change and her goal is to  continue with weight loss efforts. I recommend Starasia begin the structured treatment plan as follows:  She has agreed to follow the category 3 plan Marylene has been instructed to eventually work up to a goal of 150 minutes of combined cardio and strengthening exercise per week for weight loss and overall health benefits. We discussed the following Behavioral Modification Strategies today: increasing lean protein intake, increasing vegetables and work on meal planning and easy cooking plans   She was informed of the importance of frequent follow up visits to maximize her success with intensive lifestyle modifications for her multiple health conditions. She was informed we would discuss her lab results at her next visit unless there is a critical issue that needs to be addressed sooner. Chinenye agreed to keep her next visit at the agreed upon time to discuss these results.  ALLERGIES: Allergies  Allergen Reactions  . Influenza Vaccines   . Metformin And Related Diarrhea  . Morphine Hives  . Augmentin [Amoxicillin-Pot Clavulanate] Rash    To bilat post hands only    MEDICATIONS: Current Outpatient Medications on File Prior to Visit  Medication Sig Dispense Refill  . albuterol (PROAIR HFA) 108 (90 BASE) MCG/ACT inhaler Inhale 2 puffs into the lungs every 4 (four) hours as needed for wheezing or shortness of breath. 18 g 2  . albuterol (PROVENTIL) (2.5 MG/3ML) 0.083% nebulizer solution Take 2.5 mg by nebulization every 6 (six) hours as needed for wheezing or shortness of breath.    [name] amLODipine (NORVASC) 10 MG tablet Take 1 tablet (10 mg total) by mouth daily. 90 tablet 3  . aspirin EC 81 MG tablet Take 81 mg by mouth daily.    . blood glucose meter kit and supplies Dispense based on patient and insurance preference. Use up to four times daily as directed. (FOR ICD-9 250.00, 250.01). 1 each 0  . Diclofenac Sodium (PENNSAID) 2 % SOLN Place 1 application onto the skin 2 (two) times daily. 1  Bottle 3  . Docusate Sodium (COLACE PO) Take by mouth.    . empagliflozin (JARDIANCE) 25 MG TABS tablet Take 25 mg by mouth daily. 90 tablet 1  . fexofenadine (ALLEGRA) 180 MG tablet Take 180 mg by mouth daily as needed. For seasonal allergies    . fluconazole (DIFLUCAN) 150 MG tablet Take 1 tablet (150 mg total) by mouth daily. 10 tablet 0  . fluticasone (FLONASE) 50 MCG/ACT nasal spray Place 2 sprays into both nostrils daily. 16 g 6  . glipiZIDE (GLUCOTROL) 5 MG tablet TAKE 1 TABLET(5 MG) BY MOUTH TWICE DAILY BEFORE A MEAL 180 tablet 1  . glucose blood (ONE TOUCH ULTRA TEST) test strip Use as directed 100 each 2  . glucose blood test strip Used to check blood sugars up to four times daily as needed 200 each 3  . ibuprofen (ADVIL,MOTRIN) 800 MG tablet ibuprofen 800 mg tablet    . Insulin Pen Needle (B-D ULTRAFINE III SHORT PEN) 31G X 8 MM MISC USE AS DIRECTED TO INJECT INSULIN. 100 each  0  . Lancets MISC Used to check blood sugars up to four times daily as needed 200 each 3  . losartan (COZAAR) 100 MG tablet TAKE 1 TABLET(100 MG) BY MOUTH DAILY 90 tablet 1  . nystatin-triamcinolone ointment (MYCOLOG) Apply 1 application topically 2 (two) times daily. 60 g 0  . olopatadine (PATANOL) 0.1 % ophthalmic solution Apply to eye.    . rosuvastatin (CRESTOR) 40 MG tablet Take 1 tablet (40 mg total) by mouth daily. 30 tablet 3  . ticagrelor (BRILINTA) 90 MG TABS tablet Take 0.5 tablets (45 mg total) by mouth 2 (two) times daily. 60 tablet 0   No current facility-administered medications on file prior to visit.     PAST MEDICAL HISTORY: Past Medical History:  Diagnosis Date  . Allergic rhinitis, cause unspecified   . Arthritis   . Asthma   . Diabetes mellitus type II 08/2009 dx  . Endometriosis   . Hyperlipemia   . Hypertension   . Knee pain, right    DJD, post traumatic with repeat falls  . Lactose intolerance   . Morbid obesity ([place])    s/p lab band 09/2010  . Osteoarthritis   . Ovarian  cyst    right s/p resection June 2013  . Stroke [facility])     PAST SURGICAL HISTORY: Past Surgical History:  Procedure Laterality Date  . ABDOMINAL HYSTERECTOMY  05/14/12   [place] [facility]  . IR ANGIO INTRA EXTRACRAN SEL COM CAROTID INNOMINATE BILAT MOD SED  07/04/2017  . IR ANGIO VERTEBRAL SEL VERTEBRAL BILAT MOD SED  07/04/2017  . IR INTRA CRAN STENT  07/07/2017  . IR RADIOLOGIST EVAL & MGMT  08/13/2017  . LAPAROSCOPIC GASTRIC BANDING  10/02/2010   start weight 349#  . RADIOLOGY WITH ANESTHESIA N/A 07/07/2017   Procedure: STENTING;  Surgeon: Luanne Bras, MD;  Location: [facility];  Service: Radiology;  Laterality: N/A;  . SALPINGOOPHORECTOMY  10/08/11   right    SOCIAL HISTORY: Social History   Tobacco Use  . Smoking status: Never Smoker  . Smokeless tobacco: Never Used  Substance Use Topics  . Alcohol use: No    Alcohol/week: 0.0 standard drinks  . Drug use: No    FAMILY HISTORY: Family History  Problem Relation Age of Onset  . Heart disease Mother   . Hypertension Mother   . Diabetes Mother   . Stroke Father 62  . Diabetes Father   . Hypertension Father   . Hyperlipidemia Father   . Heart disease Father   . Kidney disease Father   . Cancer Father   . Liver disease Father   . Obesity Father     ROS: Review of Systems  Constitutional: Positive for malaise/fatigue. Negative for weight loss.       + Trouble sleeping  Respiratory: Positive for shortness of breath (with exertion).   Cardiovascular: Negative for chest pain and orthopnea.       Negative chest pressure  Gastrointestinal:       + Rectal bleeding  Musculoskeletal:       + Muscle or joint pain + Red or swollen joints  Neurological: Negative for headaches.  Endo/Heme/Allergies: Bruises/bleeds easily.       Negative hypoglycemia    PHYSICAL EXAM: Blood pressure 114/76, pulse 72, temperature 97.8 F (36.6 C), temperature source Oral, height '5\' 7"'  (1.702 m), weight (!) 322 lb (146.1 kg), last  menstrual period 07/28/2011, SpO2 98 %. Body mass index is 50.43 kg/m. Physical Exam Vitals signs  reviewed.  Constitutional:      Appearance: Normal appearance. She is obese.  HENT:     Head: Normocephalic and atraumatic.     Nose: Nose normal.  Eyes:     General: No scleral icterus.    Extraocular Movements: Extraocular movements intact.  Neck:     Musculoskeletal: Normal range of motion and neck supple.     Comments: No thyromegaly present Cardiovascular:     Rate and Rhythm: Normal rate and regular rhythm.     Pulses: Normal pulses.     Heart sounds: Normal heart sounds.  Pulmonary:     Effort: Pulmonary effort is normal. No respiratory distress.     Breath sounds: Normal breath sounds.  Abdominal:     Palpations: Abdomen is soft.     Tenderness: There is no abdominal tenderness.     Comments: + Obesity  Musculoskeletal: Normal range of motion.     Right lower leg: No edema.     Left lower leg: No edema.  Skin:    General: Skin is warm and dry.  Neurological:     Mental Status: She is alert and oriented to person, place, and time.     Coordination: Coordination normal.  Psychiatric:        Mood and Affect: Mood normal.        Behavior: Behavior normal.     RECENT LABS AND TESTS: BMET    Component Value Date/Time   NA 140 01/05/2018 1013   K 3.6 01/05/2018 1013   CL 104 01/05/2018 1013   CO2 28 01/05/2018 1013   GLUCOSE 249 (H) 01/05/2018 1013   BUN 9 01/05/2018 1013   CREATININE 0.77 01/05/2018 1013   CALCIUM 8.9 01/05/2018 1013   GFRNONAA >60 07/09/2017 0606   GFRAA >60 07/09/2017 0606   Lab Results  Component Value Date   HGBA1C 9.2 (A) 04/09/2018   No results found for: INSULIN CBC    Component Value Date/Time   WBC 8.7 01/05/2018 1013   RBC 5.10 01/05/2018 1013   HGB 13.0 01/05/2018 1013   HCT 40.2 01/05/2018 1013   PLT 314.0 01/05/2018 1013   MCV 78.8 01/05/2018 1013   MCH 26.7 07/09/2017 0606   MCHC 32.5 01/05/2018 1013   RDW 13.8  01/05/2018 1013   LYMPHSABS 2.0 07/08/2017 0607   MONOABS 0.3 07/08/2017 0607   EOSABS 0.2 07/08/2017 0607   BASOSABS 0.0 07/08/2017 0607   Iron/TIBC/Ferritin/ %Sat No results found for: IRON, TIBC, FERRITIN, IRONPCTSAT Lipid Panel     Component Value Date/Time   CHOL 156 05/20/2018 1321   TRIG 73 05/20/2018 1321   HDL 56 05/20/2018 1321   CHOLHDL 2.8 05/20/2018 1321   CHOLHDL 6.2 07/01/2017 0800   VLDL 12 07/01/2017 0800   LDLCALC 85 05/20/2018 1321   Hepatic Function Panel     Component Value Date/Time   PROT 6.4 01/05/2018 1013   ALBUMIN 3.5 01/05/2018 1013   AST 12 01/05/2018 1013   ALT 17 01/05/2018 1013   ALKPHOS 103 01/05/2018 1013   BILITOT 0.4 01/05/2018 1013   BILIDIR 0.1 04/18/2015 1140      Component Value Date/Time   TSH 2.54 04/18/2015 1140   TSH 1.66 07/22/2013 1215   TSH 1.24 07/22/2011 1001    ECG  shows NSR with a rate of 71 BPM INDIRECT CALORIMETER done today shows a VO2 of 323 and a REE of 2252.  Her calculated basal metabolic rate is 4967 thus her basal metabolic rate  is better than expected.       OBESITY BEHAVIORAL INTERVENTION VISIT  Today's visit was # 1   Starting weight: 322 lbs Starting date: 07/01/2018 Today's weight : 322 lbs Today's date: 07/01/2018 Total lbs lost to date: 0    07/01/2018  Height '5\' 7"'  (1.702 m)  Weight 322 lb (146.1 kg) (A)  BMI (Calculated) 50.42  BLOOD PRESSURE - SYSTOLIC 171  BLOOD PRESSURE - DIASTOLIC 76  Waist Measurement  49.5 inches   Body Fat % 54.1 %  Total Body Water (lbs) 102.8 lbs  RMR 2252     ASK: We discussed the diagnosis of obesity with Debra [name] today and Ashley agreed to give [name] permission to discuss obesity behavioral modification therapy today.  ASSESS: Wanell has the diagnosis of obesity and her BMI today is 50.42 Zilda is in the action stage of change   ADVISE: Lyliana was educated on the multiple health risks of obesity as well as the benefit of weight loss to  improve her health. She was advised of the need for long term treatment and the importance of lifestyle modifications to improve her current health and to decrease her risk of future health problems.  AGREE: Multiple dietary modification options and treatment options were discussed and  Derrian agreed to follow the recommendations documented in the above note.  ARRANGE: Stephenie was educated on the importance of frequent visits to treat obesity as outlined per CMS and USPSTF guidelines and agreed to schedule her next follow up appointment today.  I, Trixie Dredge, am acting as transcriptionist for Ilene Qua, MD   I have reviewed the above documentation for accuracy and completeness, and I agree with the above. - Ilene Qua, MD

## 2018-07-06 ENCOUNTER — Other Ambulatory Visit: Payer: Self-pay

## 2018-07-06 ENCOUNTER — Encounter (INDEPENDENT_AMBULATORY_CARE_PROVIDER_SITE_OTHER): Payer: Self-pay | Admitting: Family Medicine

## 2018-07-06 MED ORDER — EMPAGLIFLOZIN 25 MG PO TABS: 25.0000 mg | ORAL_TABLET | Freq: Every day | ORAL | 1 refills | Status: DC

## 2018-07-09 ENCOUNTER — Other Ambulatory Visit: Payer: Self-pay

## 2018-07-09 ENCOUNTER — Ambulatory Visit: Payer: BC Managed Care – PPO | Admitting: Internal Medicine

## 2018-07-09 ENCOUNTER — Encounter: Payer: Self-pay | Admitting: Internal Medicine

## 2018-07-09 VITALS — BP 110/60 | HR 66 | Temp 99.0°F | Ht 67.0 in | Wt 323.0 lb

## 2018-07-09 DIAGNOSIS — I10 Essential (primary) hypertension: Secondary | ICD-10-CM

## 2018-07-09 DIAGNOSIS — E1165 Type 2 diabetes mellitus with hyperglycemia: Secondary | ICD-10-CM | POA: Diagnosis not present

## 2018-07-09 DIAGNOSIS — Z8673 Personal history of transient ischemic attack (TIA), and cerebral infarction without residual deficits: Secondary | ICD-10-CM | POA: Diagnosis not present

## 2018-07-09 LAB — POCT GLYCOSYLATED HEMOGLOBIN (HGB A1C): Hemoglobin A1C: 9.7 % — AB (ref 4.0–5.6)

## 2018-07-09 MED ORDER — VITAMIN D (ERGOCALCIFEROL) 1.25 MG (50000 UNIT) PO CAPS: 50000.0000 [IU] | ORAL_CAPSULE | ORAL | 0 refills | Status: DC

## 2018-07-09 NOTE — Assessment & Plan Note (Signed)
Continued improvement. We talked about better sugar management to help avoid another stroke. BP at goal, lipids slightly above goal recently.

## 2018-07-09 NOTE — Assessment & Plan Note (Signed)
Taking amlodipine and losartan. Recent BMP without indications for change.

## 2018-07-09 NOTE — Progress Notes (Signed)
   Subjective:   Patient ID: Debra [name], female    DOB: Apr 12, 1976, 43 y.o.   MRN: [id number]  HPI The patient is a 43 YO female coming in for follow up of her diabetes (taking jardiance and glipizide, denies checking sugars after meals, in the morning running 150s typically, she is having itching from jardiance and is taking it but not found of how it makes her feel, has taken trulicity in the past without much luck, metformin caused severe diarrhea, checked sugar after a meal which was 260, she just went to weight management and they have her starting a new diet in the last 1-2 days) and her stroke (speech is still improving and is much better, still a few lapses in words but not noticeable and no slurring of speech, denies weakness), and blood pressure (doing well, denies headaches or chest pains, amlodipine and losrtan without side effects, BP at goal at home).   Review of Systems  Constitutional: Negative.   HENT: Negative.   Eyes: Negative.   Respiratory: Negative for cough, chest tightness and shortness of breath.   Cardiovascular: Negative for chest pain, palpitations and leg swelling.  Gastrointestinal: Negative for abdominal distention, abdominal pain, constipation, diarrhea, nausea and vomiting.  Musculoskeletal: Negative.   Skin: Negative.        itching  Neurological: Negative.   Psychiatric/Behavioral: Negative.     Objective:  Physical Exam Constitutional:      Appearance: She is well-developed.  HENT:     Head: Normocephalic and atraumatic.  Neck:     Musculoskeletal: Normal range of motion.  Cardiovascular:     Rate and Rhythm: Normal rate and regular rhythm.  Pulmonary:     Effort: Pulmonary effort is normal. No respiratory distress.     Breath sounds: Normal breath sounds. No wheezing or rales.  Abdominal:     General: Bowel sounds are normal. There is no distension.     Palpations: Abdomen is soft.     Tenderness: There is no abdominal tenderness. There is  no rebound.  Skin:    General: Skin is warm and dry.  Neurological:     Mental Status: She is alert and oriented to person, place, and time.     Coordination: Coordination normal.     Vitals:   07/09/18 1257  BP: 110/60  Pulse: 66  Temp: 99 F (37.2 C)  TempSrc: Oral  SpO2: 98%  Weight: (!) 323 lb (146.5 kg)  Height: 5\' 7"  (1.702 m)    Assessment & Plan:

## 2018-07-09 NOTE — Patient Instructions (Addendum)
Your HgA1c is 9.7. Try the new diet and check sugars in the morning and about 30-60 minutes after meals. Let us know in a couple of weeks and we may need to adjust the medicines.  We have sent in the vitamin D to take 1 pill weekly for 3 months then stop.

## 2018-07-09 NOTE — Assessment & Plan Note (Signed)
HgA1c 9.7 done in the office which is worsened. She will be changing diet and wants to wait for that. Can try acarbose with meals if not able to improve. She is not sure if she wants to keep jardiance but will for now and continue glipizide as well.

## 2018-07-14 ENCOUNTER — Encounter (INDEPENDENT_AMBULATORY_CARE_PROVIDER_SITE_OTHER): Payer: Self-pay

## 2018-07-15 ENCOUNTER — Encounter (INDEPENDENT_AMBULATORY_CARE_PROVIDER_SITE_OTHER): Payer: Self-pay

## 2018-07-15 ENCOUNTER — Other Ambulatory Visit: Payer: Self-pay

## 2018-07-15 ENCOUNTER — Encounter (INDEPENDENT_AMBULATORY_CARE_PROVIDER_SITE_OTHER): Payer: Self-pay | Admitting: Family Medicine

## 2018-07-15 ENCOUNTER — Ambulatory Visit (INDEPENDENT_AMBULATORY_CARE_PROVIDER_SITE_OTHER): Payer: BC Managed Care – PPO | Admitting: Family Medicine

## 2018-07-15 DIAGNOSIS — E559 Vitamin D deficiency, unspecified: Secondary | ICD-10-CM | POA: Diagnosis not present

## 2018-07-15 DIAGNOSIS — E1165 Type 2 diabetes mellitus with hyperglycemia: Secondary | ICD-10-CM | POA: Diagnosis not present

## 2018-07-15 DIAGNOSIS — Z6841 Body Mass Index (BMI) 40.0 and over, adult: Secondary | ICD-10-CM | POA: Diagnosis not present

## 2018-07-16 MED ORDER — SEMAGLUTIDE 3 MG PO TABS: 3.0000 mg | ORAL_TABLET | Freq: Every day | ORAL | 0 refills | Status: DC

## 2018-07-16 NOTE — Progress Notes (Signed)
Office: [phone]  /  Fax: [phone] TeleHealth Visit:  Debra [name] has consented to this TeleHealth visit today via telephone call. The patient is located in her car, the provider is located at the [employer] [facility] office. The participants in this visit include the listed provider and patient and any and all parties involved. Time spent on visit was 25 minutes  HPI:   Chief Complaint: OBESITY Debra [name] is here to discuss her progress with her obesity treatment plan. She is on the Category 3 plan and is following her eating plan approximately 80 % of the time. She states she is walking for 30 minutes 4 times per week. Debra [name] has found it hard to stay focused amid the [place] virus. She is struggling to get all of the food in on her meal plan. She is doing ranch powder in [place] yogurt, sugar free wafers and fruit. [name] has done some snacking secondary to boredom. We were unable to weight the patient today for this TeleHealth visit.She feels as if she has lost 3 pounds  since her last visit. She has lost 3 lbs since starting treatment with [name].  Vitamin D deficiency Udell has a diagnosis of vitamin D deficiency. Her last vitamin D level was at 22.6 [name] is currently taking prescription vit D 50,000 IU weekly  and she denies nausea, vomiting or muscle weakness.  Diabetes II non insulin, with hyperglycemia Debra [name] has a diagnosis of diabetes type II. Debra [name] states morning fasting BGs are in the 150's. Last A1c was at 9.7 She was prescribed Jardiance by her PCP and she agreed to take Glipizide. She has been working on intensive lifestyle modifications including diet, exercise, and weight loss to help control her blood glucose levels.  ASSESSMENT AND PLAN:  Vitamin D deficiency  Type 2 diabetes mellitus with hyperglycemia, without long-term current use of insulin (HCC) - Plan: Semaglutide (RYBELSUS) 3 MG TABS  Class 3 severe obesity with serious comorbidity and body  mass index (BMI) of 50.0 to 59.9 in adult, unspecified obesity type ([place])  PLAN:  Vitamin D Deficiency [name] was informed that low vitamin D levels contributes to fatigue and are associated with obesity, breast, and colon cancer. She will continue her vitamin D supplementation as prescribed by her PCP and will follow up for routine testing of vitamin D, at least 2-3 times per year. She was informed of the risk of over-replacement of vitamin D and agrees to not increase her dose unless she discusses this with [name] first.  Diabetes II non insulin, with hyperglycemia Debra [name] has been given extensive diabetes education by myself today including ideal fasting and post-prandial blood glucose readings, individual ideal Hgb A1c goals and hypoglycemia prevention. We discussed the importance of good blood sugar control to decrease the likelihood of diabetic complications such as nephropathy, neuropathy, limb loss, blindness, coronary artery disease, and death. We discussed the importance of intensive lifestyle modification including diet, exercise and weight loss as the first line treatment for diabetes. Debra [name] agrees to start Rybelsus 3 mg qAM #30 with no refills. Debra [name] will keep checking her blood sugar at least fasting and she agrees to follow up at the agreed upon time.  Obesity Debra [name] is currently in the action stage of change. As such, her goal is to continue with weight loss efforts She has agreed to follow the Category 3 plan Debra [name] has been instructed to work up to a goal of 150 minutes of combined cardio and strengthening exercise per week for  weight loss and overall health benefits. We discussed the following Behavioral Modification Strategies today: planning for success, better snacking choices, increasing lean protein intake, increasing vegetables and work on meal planning and easy cooking plans  Debra [name] has agreed to follow up with our clinic in 2 weeks. She was informed of the importance  of frequent follow up visits to maximize her success with intensive lifestyle modifications for her multiple health conditions.  ALLERGIES: Allergies  Allergen Reactions  . Influenza Vaccines   . Metformin And Related Diarrhea  . Morphine Hives  . Augmentin [Amoxicillin-Pot Clavulanate] Rash    To bilat post hands only    MEDICATIONS: Current Outpatient Medications on File Prior to Visit  Medication Sig Dispense Refill  . albuterol (PROAIR HFA) 108 (90 BASE) MCG/ACT inhaler Inhale 2 puffs into the lungs every 4 (four) hours as needed for wheezing or shortness of breath. 18 g 2  . albuterol (PROVENTIL) (2.5 MG/3ML) 0.083% nebulizer solution Take 2.5 mg by nebulization every 6 (six) hours as needed for wheezing or shortness of breath.    Debra [name] Kitchen amLODipine (NORVASC) 10 MG tablet Take 1 tablet (10 mg total) by mouth daily. 90 tablet 3  . aspirin EC 81 MG tablet Take 81 mg by mouth daily.    . blood glucose meter kit and supplies Dispense based on patient and insurance preference. Use up to four times daily as directed. (FOR ICD-9 250.00, 250.01). 1 each 0  . Diclofenac Sodium (PENNSAID) 2 % SOLN Place 1 application onto the skin 2 (two) times daily. 1 Bottle 3  . Docusate Sodium (COLACE PO) Take by mouth.    . empagliflozin (JARDIANCE) 25 MG TABS tablet Take 25 mg by mouth daily. 90 tablet 1  . fexofenadine (ALLEGRA) 180 MG tablet Take 180 mg by mouth daily as needed. For seasonal allergies    . fluconazole (DIFLUCAN) 150 MG tablet Take 1 tablet (150 mg total) by mouth daily. 10 tablet 0  . fluticasone (FLONASE) 50 MCG/ACT nasal spray Place 2 sprays into both nostrils daily. 16 g 6  . glucose blood (ONE TOUCH ULTRA TEST) test strip Use as directed 100 each 2  . glucose blood test strip Used to check blood sugars up to four times daily as needed 200 each 3  . ibuprofen (ADVIL,MOTRIN) 800 MG tablet ibuprofen 800 mg tablet    . Insulin Pen Needle (B-D ULTRAFINE III SHORT PEN) 31G X 8 MM MISC USE AS  DIRECTED TO INJECT INSULIN. 100 each 0  . Lancets MISC Used to check blood sugars up to four times daily as needed 200 each 3  . losartan (COZAAR) 100 MG tablet TAKE 1 TABLET(100 MG) BY MOUTH DAILY 90 tablet 1  . nystatin-triamcinolone ointment (MYCOLOG) Apply 1 application topically 2 (two) times daily. 60 g 0  . olopatadine (PATANOL) 0.1 % ophthalmic solution Apply to eye.    . rosuvastatin (CRESTOR) 40 MG tablet Take 1 tablet (40 mg total) by mouth daily. 30 tablet 3  . ticagrelor (BRILINTA) 90 MG TABS tablet Take 0.5 tablets (45 mg total) by mouth 2 (two) times daily. 60 tablet 0  . Vitamin D, Ergocalciferol, (DRISDOL) 1.25 MG (50000 UT) CAPS capsule Take 1 capsule (50,000 Units total) by mouth every 7 (seven) days. 12 capsule 0   No current facility-administered medications on file prior to visit.     PAST MEDICAL HISTORY: Past Medical History:  Diagnosis Date  . Allergic rhinitis, cause unspecified   . Arthritis   .  Asthma   . Diabetes mellitus type II 08/2009 dx  . Endometriosis   . Hyperlipemia   . Hypertension   . Knee pain, right    DJD, post traumatic with repeat falls  . Lactose intolerance   . Morbid obesity ([place])    s/p lab band 09/2010  . Osteoarthritis   . Ovarian cyst    right s/p resection June 2013  . Stroke [facility])     PAST SURGICAL HISTORY: Past Surgical History:  Procedure Laterality Date  . ABDOMINAL HYSTERECTOMY  05/14/12   [place]  . IR ANGIO INTRA EXTRACRAN SEL COM CAROTID INNOMINATE BILAT MOD SED  07/04/2017  . IR ANGIO VERTEBRAL SEL VERTEBRAL BILAT MOD SED  07/04/2017  . IR INTRA CRAN STENT  07/07/2017  . IR RADIOLOGIST EVAL & MGMT  08/13/2017  . LAPAROSCOPIC GASTRIC BANDING  10/02/2010   start weight 349#  . RADIOLOGY WITH ANESTHESIA N/A 07/07/2017   Procedure: STENTING;  Surgeon: Luanne Bras, MD;  Location: [facility];  Service: Radiology;  Laterality: N/A;  . SALPINGOOPHORECTOMY  10/08/11   right    SOCIAL HISTORY: Social History    Tobacco Use  . Smoking status: Never Smoker  . Smokeless tobacco: Never Used  Substance Use Topics  . Alcohol use: No    Alcohol/week: 0.0 standard drinks  . Drug use: No    FAMILY HISTORY: Family History  Problem Relation Age of Onset  . Heart disease Mother   . Hypertension Mother   . Diabetes Mother   . Stroke Father 62  . Diabetes Father   . Hypertension Father   . Hyperlipidemia Father   . Heart disease Father   . Kidney disease Father   . Cancer Father   . Liver disease Father   . Obesity Father     ROS: Review of Systems  Constitutional: Positive for weight loss.  Gastrointestinal: Negative for nausea and vomiting.  Musculoskeletal:       Negative for muscle weakness  Endo/Heme/Allergies:       Positive for hyperglycemia    PHYSICAL EXAM: Pt in no acute distress  RECENT LABS AND TESTS: BMET    Component Value Date/Time   NA 142 07/01/2018 1412   K 3.7 07/01/2018 1412   CL 103 07/01/2018 1412   CO2 21 07/01/2018 1412   GLUCOSE 173 (H) 07/01/2018 1412   GLUCOSE 249 (H) 01/05/2018 1013   BUN 10 07/01/2018 1412   CREATININE 0.75 07/01/2018 1412   CALCIUM 9.2 07/01/2018 1412   GFRNONAA 97 07/01/2018 1412   GFRAA 112 07/01/2018 1412   Lab Results  Component Value Date   HGBA1C 9.7 (A) 07/09/2018   HGBA1C 9.2 (A) 04/09/2018   HGBA1C 11.1 (H) 01/05/2018   HGBA1C 11.2 (H) 10/02/2017   HGBA1C 8.5 (H) 07/01/2017   Lab Results  Component Value Date   INSULIN 8.9 07/01/2018   CBC    Component Value Date/Time   WBC 9.9 07/01/2018 1412   WBC 8.7 01/05/2018 1013   RBC 5.24 07/01/2018 1412   RBC 5.10 01/05/2018 1013   HGB 13.5 07/01/2018 1412   HCT 42.7 07/01/2018 1412   PLT 314.0 01/05/2018 1013   MCV 82 07/01/2018 1412   MCH 25.8 (L) 07/01/2018 1412   MCH 26.7 07/09/2017 0606   MCHC 31.6 07/01/2018 1412   MCHC 32.5 01/05/2018 1013   RDW 13.7 07/01/2018 1412   LYMPHSABS 2.2 07/01/2018 1412   MONOABS 0.3 07/08/2017 0607   EOSABS 0.3  07/01/2018  1412   BASOSABS 0.1 07/01/2018 1412   Iron/TIBC/Ferritin/ %Sat No results found for: IRON, TIBC, FERRITIN, IRONPCTSAT Lipid Panel     Component Value Date/Time   CHOL 156 05/20/2018 1321   TRIG 73 05/20/2018 1321   HDL 56 05/20/2018 1321   CHOLHDL 2.8 05/20/2018 1321   CHOLHDL 6.2 07/01/2017 0800   VLDL 12 07/01/2017 0800   LDLCALC 85 05/20/2018 1321   Hepatic Function Panel     Component Value Date/Time   PROT 6.7 07/01/2018 1412   ALBUMIN 4.2 07/01/2018 1412   AST 14 07/01/2018 1412   ALT 19 07/01/2018 1412   ALKPHOS 109 07/01/2018 1412   BILITOT 0.3 07/01/2018 1412   BILIDIR 0.1 04/18/2015 1140      Component Value Date/Time   TSH 1.620 07/01/2018 1412   TSH 2.54 04/18/2015 1140   TSH 1.66 07/22/2013 1215     Ref. Range 07/01/2018 14:12  Vitamin D, 25-Hydroxy Latest Ref Range: 30.0 - 100.0 ng/mL 22.6 (L)     I, Doreene Nest, am acting as transcriptionist for Eber Jones, MD  I have reviewed the above documentation for accuracy and completeness, and I agree with the above. - Ilene Qua, MD

## 2018-07-30 ENCOUNTER — Other Ambulatory Visit: Payer: Self-pay

## 2018-07-30 ENCOUNTER — Encounter (INDEPENDENT_AMBULATORY_CARE_PROVIDER_SITE_OTHER): Payer: Self-pay | Admitting: Family Medicine

## 2018-07-30 ENCOUNTER — Ambulatory Visit (INDEPENDENT_AMBULATORY_CARE_PROVIDER_SITE_OTHER): Payer: BC Managed Care – PPO | Admitting: Family Medicine

## 2018-07-30 DIAGNOSIS — E559 Vitamin D deficiency, unspecified: Secondary | ICD-10-CM | POA: Diagnosis not present

## 2018-07-30 DIAGNOSIS — E1165 Type 2 diabetes mellitus with hyperglycemia: Secondary | ICD-10-CM

## 2018-07-30 DIAGNOSIS — Z6841 Body Mass Index (BMI) 40.0 and over, adult: Secondary | ICD-10-CM | POA: Diagnosis not present

## 2018-08-03 NOTE — Progress Notes (Signed)
Office: [phone]  /  Fax: [phone] TeleHealth Visit:  Debra [name] has verbally consented to this TeleHealth visit today. The patient is located at home, the provider is located at the [employer] [facility] office. The participants in this visit include the listed provider and patient. The visit was conducted today via FaceTime.  HPI:   Chief Complaint: OBESITY Debra [name] is here to discuss her progress with her obesity treatment plan. She is on the Category 3 plan and is following her eating plan approximately 80% of the time. She states she is walking 35-40 minutes 4 days a week. Evola states she is still not always able to find food on her meal plan. She has been doing some walking. Occasionally she chooses to indulge in sweets, not daily but on most days. She reports on some days she is all in to the meal plan and some days she can't focus. We were unable to weigh the patient today for this TeleHealth visit. She feels as if she has lost a little weight since her last visit. She has lost 0 lbs since starting treatment with [name].  Diabetes II with Hyperglycemia, not on long-term insulin Debra [name] has a diagnosis of diabetes type II. She states she is enjoying Rybelsus; stopped Jardiance. Debra [name] states fasting blood sugars run low in the 90's, otherwise averaging 135 and 145. She denies feelings of hypoglycemic episodes. Last A1c was reported at 9.7 on 07/09/2018. She has been working on intensive lifestyle modifications including diet, exercise, and weight loss to help control her blood glucose levels.  Vitamin D deficiency Debra [name] has a diagnosis of Vitamin D deficiency. She is currently taking Vit D and denies nausea, vomiting or muscle weakness. She does report fatigue.  ASSESSMENT AND PLAN:  Type 2 diabetes mellitus with hyperglycemia, without long-term current use of insulin (HCC)  Vitamin D deficiency  Class 3 severe obesity with serious comorbidity and body mass index  (BMI) of 50.0 to 59.9 in adult, unspecified obesity type ([place])  PLAN:  Diabetes II with Hyperglycemia, not on long-term insulin Debra [name] has been given extensive diabetes education by myself today including ideal fasting and post-prandial blood glucose readings, individual ideal Hgb A1c goals  and hypoglycemia prevention. We discussed the importance of good blood sugar control to decrease the likelihood of diabetic complications such as nephropathy, neuropathy, limb loss, blindness, coronary artery disease, and death. We discussed the importance of intensive lifestyle modification including diet, exercise and weight loss as the first line treatment for diabetes. Debra [name] will continue taking Rybelsus 3 mg; will need to increase to 7 mg at her next appointment. She continues taking Glipizide. Debra [name] agrees to follow-up with our clinic in 2 weeks.  Vitamin D Deficiency Debra [name] was informed that low Vitamin D levels contributes to fatigue and are associated with obesity, breast, and colon cancer. She agrees to continue taking Vit D supplement (no refill needed) and will follow-up for routine testing of Vitamin D, at least 2-3 times per year. She was informed of the risk of over-replacement of Vitamin D and agrees to not increase her dose unless she discusses this with [name] first. Debra [name] agrees to follow-up with our clinic in 2 weeks.  Obesity Debra [name] is currently in the action stage of change. As such, her goal is to continue with weight loss efforts. She has agreed to follow the Category 3 plan. Debra [name] has been instructed to work up to a goal of 150 minutes of combined cardio and strengthening exercise per week  for weight loss and overall health benefits. We discussed the following Behavioral Modification Strategies today: increasing lean protein intake, increasing vegetables, work on meal planning and easy cooking plans, better snacking choices, emotional eating strategies, avoiding temptations, and  planning for success.  Debra [name] has agreed to follow-up with our clinic in 2 weeks. She was informed of the importance of frequent follow-up visits to maximize her success with intensive lifestyle modifications for her multiple health conditions.  ALLERGIES: Allergies  Allergen Reactions  . Influenza Vaccines   . Metformin And Related Diarrhea  . Morphine Hives  . Augmentin [Amoxicillin-Pot Clavulanate] Rash    To bilat post hands only    MEDICATIONS: Current Outpatient Medications on File Prior to Visit  Medication Sig Dispense Refill  . albuterol (PROAIR HFA) 108 (90 BASE) MCG/ACT inhaler Inhale 2 puffs into the lungs every 4 (four) hours as needed for wheezing or shortness of breath. 18 g 2  . albuterol (PROVENTIL) (2.5 MG/3ML) 0.083% nebulizer solution Take 2.5 mg by nebulization every 6 (six) hours as needed for wheezing or shortness of breath.    [name] amLODipine (NORVASC) 10 MG tablet Take 1 tablet (10 mg total) by mouth daily. 90 tablet 3  . aspirin EC 81 MG tablet Take 81 mg by mouth daily.    . blood glucose meter kit and supplies Dispense based on patient and insurance preference. Use up to four times daily as directed. (FOR ICD-9 250.00, 250.01). 1 each 0  . Diclofenac Sodium (PENNSAID) 2 % SOLN Place 1 application onto the skin 2 (two) times daily. 1 Bottle 3  . Docusate Sodium (COLACE PO) Take by mouth.    . empagliflozin (JARDIANCE) 25 MG TABS tablet Take 25 mg by mouth daily. 90 tablet 1  . fexofenadine (ALLEGRA) 180 MG tablet Take 180 mg by mouth daily as needed. For seasonal allergies    . fluconazole (DIFLUCAN) 150 MG tablet Take 1 tablet (150 mg total) by mouth daily. 10 tablet 0  . fluticasone (FLONASE) 50 MCG/ACT nasal spray Place 2 sprays into both nostrils daily. 16 g 6  . glucose blood (ONE TOUCH ULTRA TEST) test strip Use as directed 100 each 2  . glucose blood test strip Used to check blood sugars up to four times daily as needed 200 each 3  . ibuprofen  (ADVIL,MOTRIN) 800 MG tablet ibuprofen 800 mg tablet    . Insulin Pen Needle (B-D ULTRAFINE III SHORT PEN) 31G X 8 MM MISC USE AS DIRECTED TO INJECT INSULIN. 100 each 0  . Lancets MISC Used to check blood sugars up to four times daily as needed 200 each 3  . losartan (COZAAR) 100 MG tablet TAKE 1 TABLET(100 MG) BY MOUTH DAILY 90 tablet 1  . nystatin-triamcinolone ointment (MYCOLOG) Apply 1 application topically 2 (two) times daily. 60 g 0  . olopatadine (PATANOL) 0.1 % ophthalmic solution Apply to eye.    . rosuvastatin (CRESTOR) 40 MG tablet Take 1 tablet (40 mg total) by mouth daily. 30 tablet 3  . Semaglutide (RYBELSUS) 3 MG TABS Take 3 mg by mouth daily. 30 tablet 0  . ticagrelor (BRILINTA) 90 MG TABS tablet Take 0.5 tablets (45 mg total) by mouth 2 (two) times daily. 60 tablet 0  . Vitamin D, Ergocalciferol, (DRISDOL) 1.25 MG (50000 UT) CAPS capsule Take 1 capsule (50,000 Units total) by mouth every 7 (seven) days. 12 capsule 0   No current facility-administered medications on file prior to visit.     PAST MEDICAL  HISTORY: Past Medical History:  Diagnosis Date  . Allergic rhinitis, cause unspecified   . Arthritis   . Asthma   . Diabetes mellitus type II 08/2009 dx  . Endometriosis   . Hyperlipemia   . Hypertension   . Knee pain, right    DJD, post traumatic with repeat falls  . Lactose intolerance   . Morbid obesity ([place])    s/p lab band 09/2010  . Osteoarthritis   . Ovarian cyst    right s/p resection June 2013  . Stroke [facility])     PAST SURGICAL HISTORY: Past Surgical History:  Procedure Laterality Date  . ABDOMINAL HYSTERECTOMY  05/14/12   [place]  . IR ANGIO INTRA EXTRACRAN SEL COM CAROTID INNOMINATE BILAT MOD SED  07/04/2017  . IR ANGIO VERTEBRAL SEL VERTEBRAL BILAT MOD SED  07/04/2017  . IR INTRA CRAN STENT  07/07/2017  . IR RADIOLOGIST EVAL & MGMT  08/13/2017  . LAPAROSCOPIC GASTRIC BANDING  10/02/2010   start weight 349#  . RADIOLOGY WITH ANESTHESIA N/A  07/07/2017   Procedure: STENTING;  Surgeon: Luanne Bras, MD;  Location: [facility];  Service: Radiology;  Laterality: N/A;  . SALPINGOOPHORECTOMY  10/08/11   right    SOCIAL HISTORY: Social History   Tobacco Use  . Smoking status: Never Smoker  . Smokeless tobacco: Never Used  Substance Use Topics  . Alcohol use: No    Alcohol/week: 0.0 standard drinks  . Drug use: No    FAMILY HISTORY: Family History  Problem Relation Age of Onset  . Heart disease Mother   . Hypertension Mother   . Diabetes Mother   . Stroke Father 65  . Diabetes Father   . Hypertension Father   . Hyperlipidemia Father   . Heart disease Father   . Kidney disease Father   . Cancer Father   . Liver disease Father   . Obesity Father    ROS: Review of Systems  Constitutional: Positive for malaise/fatigue.  Gastrointestinal: Negative for nausea and vomiting.  Musculoskeletal:       Negative for muscle weakness.  Endo/Heme/Allergies:       Negative for hypoglycemia.   PHYSICAL EXAM: Pt in no acute distress  RECENT LABS AND TESTS: BMET    Component Value Date/Time   NA 142 07/01/2018 1412   K 3.7 07/01/2018 1412   CL 103 07/01/2018 1412   CO2 21 07/01/2018 1412   GLUCOSE 173 (H) 07/01/2018 1412   GLUCOSE 249 (H) 01/05/2018 1013   BUN 10 07/01/2018 1412   CREATININE 0.75 07/01/2018 1412   CALCIUM 9.2 07/01/2018 1412   GFRNONAA 97 07/01/2018 1412   GFRAA 112 07/01/2018 1412   Lab Results  Component Value Date   HGBA1C 9.7 (A) 07/09/2018   HGBA1C 9.2 (A) 04/09/2018   HGBA1C 11.1 (H) 01/05/2018   HGBA1C 11.2 (H) 10/02/2017   HGBA1C 8.5 (H) 07/01/2017   Lab Results  Component Value Date   INSULIN 8.9 07/01/2018   CBC    Component Value Date/Time   WBC 9.9 07/01/2018 1412   WBC 8.7 01/05/2018 1013   RBC 5.24 07/01/2018 1412   RBC 5.10 01/05/2018 1013   HGB 13.5 07/01/2018 1412   HCT 42.7 07/01/2018 1412   PLT 314.0 01/05/2018 1013   MCV 82 07/01/2018 1412   MCH 25.8 (L)  07/01/2018 1412   MCH 26.7 07/09/2017 0606   MCHC 31.6 07/01/2018 1412   MCHC 32.5 01/05/2018 1013   RDW 13.7 07/01/2018 1412  LYMPHSABS 2.2 07/01/2018 1412   MONOABS 0.3 07/08/2017 0607   EOSABS 0.3 07/01/2018 1412   BASOSABS 0.1 07/01/2018 1412   Iron/TIBC/Ferritin/ %Sat No results found for: IRON, TIBC, FERRITIN, IRONPCTSAT Lipid Panel     Component Value Date/Time   CHOL 156 05/20/2018 1321   TRIG 73 05/20/2018 1321   HDL 56 05/20/2018 1321   CHOLHDL 2.8 05/20/2018 1321   CHOLHDL 6.2 07/01/2017 0800   VLDL 12 07/01/2017 0800   LDLCALC 85 05/20/2018 1321   Hepatic Function Panel     Component Value Date/Time   PROT 6.7 07/01/2018 1412   ALBUMIN 4.2 07/01/2018 1412   AST 14 07/01/2018 1412   ALT 19 07/01/2018 1412   ALKPHOS 109 07/01/2018 1412   BILITOT 0.3 07/01/2018 1412   BILIDIR 0.1 04/18/2015 1140      Component Value Date/Time   TSH 1.620 07/01/2018 1412   TSH 2.54 04/18/2015 1140   TSH 1.66 07/22/2013 1215   Results for Trotta, Rogelio L (MRN [id number]) as of 08/03/2018 10:03  Ref. Range 07/01/2018 14:12  Vitamin D, 25-Hydroxy Latest Ref Range: 30.0 - 100.0 ng/mL 22.6 (L)    I, Michaelene Song, am acting as Location manager for Ilene Qua, MD  I have reviewed the above documentation for accuracy and completeness, and I agree with the above. - Ilene Qua, MD

## 2018-08-05 ENCOUNTER — Telehealth: Payer: Self-pay | Admitting: Student

## 2018-08-05 NOTE — Telephone Encounter (Signed)
Brilinta refill called to [facility] on [address] as requested.   Loyce Dys, MS RD PA-C

## 2018-08-13 ENCOUNTER — Ambulatory Visit (INDEPENDENT_AMBULATORY_CARE_PROVIDER_SITE_OTHER): Payer: BC Managed Care – PPO | Admitting: Family Medicine

## 2018-08-13 ENCOUNTER — Encounter (INDEPENDENT_AMBULATORY_CARE_PROVIDER_SITE_OTHER): Payer: Self-pay | Admitting: Family Medicine

## 2018-08-13 ENCOUNTER — Other Ambulatory Visit: Payer: Self-pay

## 2018-08-13 DIAGNOSIS — Z6841 Body Mass Index (BMI) 40.0 and over, adult: Secondary | ICD-10-CM

## 2018-08-13 DIAGNOSIS — I10 Essential (primary) hypertension: Secondary | ICD-10-CM

## 2018-08-13 DIAGNOSIS — E119 Type 2 diabetes mellitus without complications: Secondary | ICD-10-CM

## 2018-08-13 DIAGNOSIS — E66813 Obesity, class 3: Secondary | ICD-10-CM

## 2018-08-13 MED ORDER — SEMAGLUTIDE 7 MG PO TABS: 7.0000 mg | ORAL_TABLET | Freq: Every day | ORAL | 0 refills | Status: DC

## 2018-08-18 NOTE — Progress Notes (Signed)
Office: [phone]  /  Fax: [phone] TeleHealth Visit:  Debra [name] has verbally consented to this TeleHealth visit today. The patient is located at home, the provider is located at the [employer] [facility] office. The participants in this visit include the listed provider and patient. The visit was conducted today via face time.  HPI:   Chief Complaint: OBESITY Debra [name] is here to discuss her progress with her obesity treatment plan. She is on the Category 3 plan and is following her eating plan approximately 80 % of the time. She states she is walking for 40 minutes 3 times per week. Debra [name] is not able to tolerate 45 calorie bread therefore, she is not eating bread throughout the day. She is otherwise finding the meal plan to be doable. No difficulty finding food. She is using Yasso bars to control her sweet cravings.  We were unable to weigh the patient today for this TeleHealth visit. She feels as if she has maintained her weight since her last visit. She has lost 0 lbs since starting treatment with [name].  Diabetes II with Hyperglycemia Debra [name] has a diagnosis of diabetes type II. Debra [name] states her BGs are running around 130. She denies feelings of hypoglycemia. Last A1c was 9.7. She has been working on intensive lifestyle modifications including diet, exercise, and weight loss to help control her blood glucose levels.  Hypertension Debra [name] is a 43 y.o. female with hypertension. [name] is on losartan and she denies chest pain, chest pressure, headaches, dizziness, or lightheadedness. She is working on weight loss to help control her blood pressure with the goal of decreasing her risk of heart attack and stroke.  ASSESSMENT AND PLAN:  Type 2 diabetes mellitus without complication, without long-term current use of insulin (HCC) - Plan: Semaglutide (RYBELSUS) 7 MG TABS  Essential hypertension  Class 3 severe obesity with serious comorbidity and body mass index  (BMI) of 50.0 to 59.9 in adult, unspecified obesity type ([place])  PLAN:  Diabetes II with Hyperglycemia Debra [name] has been given extensive diabetes education by myself today including ideal fasting and post-prandial blood glucose readings, individual ideal Hgb A1c goals and hypoglycemia prevention. We discussed the importance of good blood sugar control to decrease the likelihood of diabetic complications such as nephropathy, neuropathy, limb loss, blindness, coronary artery disease, and death. We discussed the importance of intensive lifestyle modification including diet, exercise and weight loss as the first line treatment for diabetes. Debra [name] agrees to continue taking Rybelsus 7 mg PO daily #30 and we will refill for 1 month. If fasting BGs <80's then decrease glipizide to 1 time daily, and if still <80's then stop taking it. Debra [name] agrees to follow up with our clinic in 2 weeks.  Hypertension We discussed sodium restriction, working on healthy weight loss, and a regular exercise program as the means to achieve improved blood pressure control. Debra [name] agreed with this plan and agreed to follow up as directed. We will continue to monitor her blood pressure as well as her progress with the above lifestyle modifications. Debra [name] agrees to continue her medications and will watch for signs of hypotension as she continues her lifestyle modifications. She is to check her blood pressure 2-3 times per week. Debra [name] agrees to follow up with our clinic in 2 weeks.  Obesity Debra [name] is currently in the action stage of change. As such, her goal is to continue with weight loss efforts She has agreed to follow the Category 3 plan Debra [name] has been  instructed to work up to a goal of 150 minutes of combined cardio and strengthening exercise per week for weight loss and overall health benefits. We discussed the following Behavioral Modification Strategies today: increasing lean protein intake, decreasing simple  carbohydrates, increasing vegetables, work on meal planning and easy cooking plans, keeping healthy foods in the home, and planning for success   Debra [name] has agreed to follow up with our clinic in 2 weeks. She was informed of the importance of frequent follow up visits to maximize her success with intensive lifestyle modifications for her multiple health conditions.  ALLERGIES: Allergies  Allergen Reactions  . Influenza Vaccines   . Metformin And Related Diarrhea  . Morphine Hives  . Augmentin [Amoxicillin-Pot Clavulanate] Rash    To bilat post hands only    MEDICATIONS: Current Outpatient Medications on File Prior to Visit  Medication Sig Dispense Refill  . albuterol (PROAIR HFA) 108 (90 BASE) MCG/ACT inhaler Inhale 2 puffs into the lungs every 4 (four) hours as needed for wheezing or shortness of breath. 18 g 2  . albuterol (PROVENTIL) (2.5 MG/3ML) 0.083% nebulizer solution Take 2.5 mg by nebulization every 6 (six) hours as needed for wheezing or shortness of breath.    [name] amLODipine (NORVASC) 10 MG tablet Take 1 tablet (10 mg total) by mouth daily. 90 tablet 3  . aspirin EC 81 MG tablet Take 81 mg by mouth daily.    . blood glucose meter kit and supplies Dispense based on patient and insurance preference. Use up to four times daily as directed. (FOR ICD-9 250.00, 250.01). 1 each 0  . Diclofenac Sodium (PENNSAID) 2 % SOLN Place 1 application onto the skin 2 (two) times daily. 1 Bottle 3  . Docusate Sodium (COLACE PO) Take by mouth.    . empagliflozin (JARDIANCE) 25 MG TABS tablet Take 25 mg by mouth daily. 90 tablet 1  . fexofenadine (ALLEGRA) 180 MG tablet Take 180 mg by mouth daily as needed. For seasonal allergies    . fluconazole (DIFLUCAN) 150 MG tablet Take 1 tablet (150 mg total) by mouth daily. 10 tablet 0  . fluticasone (FLONASE) 50 MCG/ACT nasal spray Place 2 sprays into both nostrils daily. 16 g 6  . glucose blood (ONE TOUCH ULTRA TEST) test strip Use as directed 100 each 2   . glucose blood test strip Used to check blood sugars up to four times daily as needed 200 each 3  . ibuprofen (ADVIL,MOTRIN) 800 MG tablet ibuprofen 800 mg tablet    . Insulin Pen Needle (B-D ULTRAFINE III SHORT PEN) 31G X 8 MM MISC USE AS DIRECTED TO INJECT INSULIN. 100 each 0  . Lancets MISC Used to check blood sugars up to four times daily as needed 200 each 3  . losartan (COZAAR) 100 MG tablet TAKE 1 TABLET(100 MG) BY MOUTH DAILY 90 tablet 1  . nystatin-triamcinolone ointment (MYCOLOG) Apply 1 application topically 2 (two) times daily. 60 g 0  . olopatadine (PATANOL) 0.1 % ophthalmic solution Apply to eye.    . rosuvastatin (CRESTOR) 40 MG tablet Take 1 tablet (40 mg total) by mouth daily. 30 tablet 3  . ticagrelor (BRILINTA) 90 MG TABS tablet Take 0.5 tablets (45 mg total) by mouth 2 (two) times daily. 60 tablet 0  . Vitamin D, Ergocalciferol, (DRISDOL) 1.25 MG (50000 UT) CAPS capsule Take 1 capsule (50,000 Units total) by mouth every 7 (seven) days. 12 capsule 0   No current facility-administered medications on file prior to visit.  PAST MEDICAL HISTORY: Past Medical History:  Diagnosis Date  . Allergic rhinitis, cause unspecified   . Arthritis   . Asthma   . Diabetes mellitus type II 08/2009 dx  . Endometriosis   . Hyperlipemia   . Hypertension   . Knee pain, right    DJD, post traumatic with repeat falls  . Lactose intolerance   . Morbid obesity ([place])    s/p lab band 09/2010  . Osteoarthritis   . Ovarian cyst    right s/p resection June 2013  . Stroke [facility])     PAST SURGICAL HISTORY: Past Surgical History:  Procedure Laterality Date  . ABDOMINAL HYSTERECTOMY  05/14/12   [place]  . IR ANGIO INTRA EXTRACRAN SEL COM CAROTID INNOMINATE BILAT MOD SED  07/04/2017  . IR ANGIO VERTEBRAL SEL VERTEBRAL BILAT MOD SED  07/04/2017  . IR INTRA CRAN STENT  07/07/2017  . IR RADIOLOGIST EVAL & MGMT  08/13/2017  . LAPAROSCOPIC GASTRIC BANDING  10/02/2010   start weight  349#  . RADIOLOGY WITH ANESTHESIA N/A 07/07/2017   Procedure: STENTING;  Surgeon: Luanne Bras, MD;  Location: [facility];  Service: Radiology;  Laterality: N/A;  . SALPINGOOPHORECTOMY  10/08/11   right    SOCIAL HISTORY: Social History   Tobacco Use  . Smoking status: Never Smoker  . Smokeless tobacco: Never Used  Substance Use Topics  . Alcohol use: No    Alcohol/week: 0.0 standard drinks  . Drug use: No    FAMILY HISTORY: Family History  Problem Relation Age of Onset  . Heart disease Mother   . Hypertension Mother   . Diabetes Mother   . Stroke Father 39  . Diabetes Father   . Hypertension Father   . Hyperlipidemia Father   . Heart disease Father   . Kidney disease Father   . Cancer Father   . Liver disease Father   . Obesity Father     ROS: Review of Systems  Constitutional: Negative for weight loss.  Cardiovascular: Negative for chest pain.       Negative chest pressure  Neurological: Negative for dizziness and headaches.       Negative lightheadedness  Endo/Heme/Allergies:       Negative hypoglycemia    PHYSICAL EXAM: Pt in no acute distress  RECENT LABS AND TESTS: BMET    Component Value Date/Time   NA 142 07/01/2018 1412   K 3.7 07/01/2018 1412   CL 103 07/01/2018 1412   CO2 21 07/01/2018 1412   GLUCOSE 173 (H) 07/01/2018 1412   GLUCOSE 249 (H) 01/05/2018 1013   BUN 10 07/01/2018 1412   CREATININE 0.75 07/01/2018 1412   CALCIUM 9.2 07/01/2018 1412   GFRNONAA 97 07/01/2018 1412   GFRAA 112 07/01/2018 1412   Lab Results  Component Value Date   HGBA1C 9.7 (A) 07/09/2018   HGBA1C 9.2 (A) 04/09/2018   HGBA1C 11.1 (H) 01/05/2018   HGBA1C 11.2 (H) 10/02/2017   HGBA1C 8.5 (H) 07/01/2017   Lab Results  Component Value Date   INSULIN 8.9 07/01/2018   CBC    Component Value Date/Time   WBC 9.9 07/01/2018 1412   WBC 8.7 01/05/2018 1013   RBC 5.24 07/01/2018 1412   RBC 5.10 01/05/2018 1013   HGB 13.5 07/01/2018 1412   HCT 42.7 07/01/2018  1412   PLT 314.0 01/05/2018 1013   MCV 82 07/01/2018 1412   MCH 25.8 (L) 07/01/2018 1412   MCH 26.7 07/09/2017 0606   MCHC  31.6 07/01/2018 1412   MCHC 32.5 01/05/2018 1013   RDW 13.7 07/01/2018 1412   LYMPHSABS 2.2 07/01/2018 1412   MONOABS 0.3 07/08/2017 0607   EOSABS 0.3 07/01/2018 1412   BASOSABS 0.1 07/01/2018 1412   Iron/TIBC/Ferritin/ %Sat No results found for: IRON, TIBC, FERRITIN, IRONPCTSAT Lipid Panel     Component Value Date/Time   CHOL 156 05/20/2018 1321   TRIG 73 05/20/2018 1321   HDL 56 05/20/2018 1321   CHOLHDL 2.8 05/20/2018 1321   CHOLHDL 6.2 07/01/2017 0800   VLDL 12 07/01/2017 0800   LDLCALC 85 05/20/2018 1321   Hepatic Function Panel     Component Value Date/Time   PROT 6.7 07/01/2018 1412   ALBUMIN 4.2 07/01/2018 1412   AST 14 07/01/2018 1412   ALT 19 07/01/2018 1412   ALKPHOS 109 07/01/2018 1412   BILITOT 0.3 07/01/2018 1412   BILIDIR 0.1 04/18/2015 1140      Component Value Date/Time   TSH 1.620 07/01/2018 1412   TSH 2.54 04/18/2015 1140   TSH 1.66 07/22/2013 1215      I, Trixie Dredge, am acting as transcriptionist for Ilene Qua, MD  I have reviewed the above documentation for accuracy and completeness, and I agree with the above. - Ilene Qua, MD

## 2018-08-27 ENCOUNTER — Encounter (INDEPENDENT_AMBULATORY_CARE_PROVIDER_SITE_OTHER): Payer: Self-pay | Admitting: Family Medicine

## 2018-08-27 ENCOUNTER — Ambulatory Visit (INDEPENDENT_AMBULATORY_CARE_PROVIDER_SITE_OTHER): Payer: BC Managed Care – PPO | Admitting: Family Medicine

## 2018-08-27 ENCOUNTER — Other Ambulatory Visit: Payer: Self-pay

## 2018-08-27 DIAGNOSIS — E1165 Type 2 diabetes mellitus with hyperglycemia: Secondary | ICD-10-CM | POA: Diagnosis not present

## 2018-08-27 DIAGNOSIS — Z6841 Body Mass Index (BMI) 40.0 and over, adult: Secondary | ICD-10-CM | POA: Diagnosis not present

## 2018-08-27 DIAGNOSIS — I10 Essential (primary) hypertension: Secondary | ICD-10-CM

## 2018-08-31 NOTE — Progress Notes (Signed)
Office: [phone]  /  Fax: [phone] TeleHealth Visit:  Debra [name] has verbally consented to this TeleHealth visit today. The patient is located at home, the provider is located at the [employer] [facility] office. The participants in this visit include the listed provider and patient. The visit was conducted today via face time.  HPI:   Chief Complaint: OBESITY Debra [name] is here to discuss her progress with her obesity treatment plan. She is on the Category 3 plan and is following her eating plan approximately 80 % of the time. She states she is exercising 0 minutes 0 times per week. Debra [name] is doing some emotional eating secondary to stress of COVID-19. She is often turning carbohydrate snacks because other stress relievers aren't available. She has had no difficulty finding food on the meal plan.  We were unable to weigh the patient today for this TeleHealth visit. She feels as if she has gained 1-2 lbs since her last visit. She has lost 0 lbs since starting treatment with [name].  Diabetes II with Hyperglycemia [name] has a diagnosis of diabetes type II. Debra [name] is still taking glipizide 5 mg BID. She states her BGs averaging at 105. Last A1c was 9.7. She denies hypoglycemia. She has been working on intensive lifestyle modifications including diet, exercise, and weight loss to help control her blood glucose levels.  Hypertension Debra [name] is a 43 y.o. female with hypertension. Debra [name] denies chest pain, chest pressure, headaches, lightheadedness, or dizziness. She states her blood pressure at home is 127/84. She is working on weight loss to help control her blood pressure with the goal of decreasing her risk of heart attack and stroke.   ASSESSMENT AND PLAN:  Type 2 diabetes mellitus with hyperglycemia, without long-term current use of insulin (HCC)  Essential hypertension  Class 3 severe obesity with serious comorbidity and body mass index (BMI) of 50.0 to 59.9 in  adult, unspecified obesity type ([place])  PLAN:  Diabetes II with Hyperglycemia Debra [name] has been given extensive diabetes education by myself today including ideal fasting and post-prandial blood glucose readings, individual ideal Hgb A1c goals and hypoglycemia prevention. We discussed the importance of good blood sugar control to decrease the likelihood of diabetic complications such as nephropathy, neuropathy, limb loss, blindness, coronary artery disease, and death. We discussed the importance of intensive lifestyle modification including diet, exercise and weight loss as the first line treatment for diabetes. Debra [name] agrees to decrease glipizide to 5 mg daily, and she agrees to follow up with our clinic in 2 weeks.  Hypertension We discussed sodium restriction, working on healthy weight loss, and a regular exercise program as the means to achieve improved blood pressure control. Anam agreed with this plan and agreed to follow up as directed. We will continue to monitor her blood pressure as well as her progress with the above lifestyle modifications. Debra [name] agrees to continue her current medications and will watch for signs of hypotension as she continues her lifestyle modifications. Debra [name] agrees to follow up with our clinic in 2 weeks.  Obesity Debra [name] is currently in the action stage of change. As such, her goal is to continue with weight loss efforts She has agreed to follow the Category 3 plan Debra [name] has been instructed to work up to a goal of 150 minutes of combined cardio and strengthening exercise per week for weight loss and overall health benefits. We discussed the following Behavioral Modification Strategies today: increasing lean protein intake, increasing vegetables and work on meal  planning and easy cooking plans, increase H20 intake, keeping healthy foods in the home, and planning for success   Debra [name] has agreed to follow up with our clinic in 2 weeks. She was informed of the  importance of frequent follow up visits to maximize her success with intensive lifestyle modifications for her multiple health conditions.  ALLERGIES: Allergies  Allergen Reactions  . Influenza Vaccines   . Metformin And Related Diarrhea  . Morphine Hives  . Augmentin [Amoxicillin-Pot Clavulanate] Rash    To bilat post hands only    MEDICATIONS: Current Outpatient Medications on File Prior to Visit  Medication Sig Dispense Refill  . albuterol (PROAIR HFA) 108 (90 BASE) MCG/ACT inhaler Inhale 2 puffs into the lungs every 4 (four) hours as needed for wheezing or shortness of breath. 18 g 2  . albuterol (PROVENTIL) (2.5 MG/3ML) 0.083% nebulizer solution Take 2.5 mg by nebulization every 6 (six) hours as needed for wheezing or shortness of breath.    [name] amLODipine (NORVASC) 10 MG tablet Take 1 tablet (10 mg total) by mouth daily. 90 tablet 3  . aspirin EC 81 MG tablet Take 81 mg by mouth daily.    . blood glucose meter kit and supplies Dispense based on patient and insurance preference. Use up to four times daily as directed. (FOR ICD-9 250.00, 250.01). 1 each 0  . Diclofenac Sodium (PENNSAID) 2 % SOLN Place 1 application onto the skin 2 (two) times daily. 1 Bottle 3  . Docusate Sodium (COLACE PO) Take by mouth.    . fexofenadine (ALLEGRA) 180 MG tablet Take 180 mg by mouth daily as needed. For seasonal allergies    . fluconazole (DIFLUCAN) 150 MG tablet Take 1 tablet (150 mg total) by mouth daily. 10 tablet 0  . fluticasone (FLONASE) 50 MCG/ACT nasal spray Place 2 sprays into both nostrils daily. 16 g 6  . glucose blood (ONE TOUCH ULTRA TEST) test strip Use as directed 100 each 2  . glucose blood test strip Used to check blood sugars up to four times daily as needed 200 each 3  . ibuprofen (ADVIL,MOTRIN) 800 MG tablet ibuprofen 800 mg tablet    . Insulin Pen Needle (B-D ULTRAFINE III SHORT PEN) 31G X 8 MM MISC USE AS DIRECTED TO INJECT INSULIN. 100 each 0  . Lancets MISC Used to check blood  sugars up to four times daily as needed 200 each 3  . losartan (COZAAR) 100 MG tablet TAKE 1 TABLET(100 MG) BY MOUTH DAILY 90 tablet 1  . nystatin-triamcinolone ointment (MYCOLOG) Apply 1 application topically 2 (two) times daily. 60 g 0  . olopatadine (PATANOL) 0.1 % ophthalmic solution Apply to eye.    . rosuvastatin (CRESTOR) 40 MG tablet Take 1 tablet (40 mg total) by mouth daily. 30 tablet 3  . Semaglutide (RYBELSUS) 7 MG TABS Take 7 mg by mouth daily. 30 tablet 0  . ticagrelor (BRILINTA) 90 MG TABS tablet Take 0.5 tablets (45 mg total) by mouth 2 (two) times daily. 60 tablet 0  . Vitamin D, Ergocalciferol, (DRISDOL) 1.25 MG (50000 UT) CAPS capsule Take 1 capsule (50,000 Units total) by mouth every 7 (seven) days. 12 capsule 0   No current facility-administered medications on file prior to visit.     PAST MEDICAL HISTORY: Past Medical History:  Diagnosis Date  . Allergic rhinitis, cause unspecified   . Arthritis   . Asthma   . Diabetes mellitus type II 08/2009 dx  . Endometriosis   . Hyperlipemia   .  Hypertension   . Knee pain, right    DJD, post traumatic with repeat falls  . Lactose intolerance   . Morbid obesity ([place])    s/p lab band 09/2010  . Osteoarthritis   . Ovarian cyst    right s/p resection June 2013  . Stroke [facility])     PAST SURGICAL HISTORY: Past Surgical History:  Procedure Laterality Date  . ABDOMINAL HYSTERECTOMY  05/14/12   [place] [facility]  . IR ANGIO INTRA EXTRACRAN SEL COM CAROTID INNOMINATE BILAT MOD SED  07/04/2017  . IR ANGIO VERTEBRAL SEL VERTEBRAL BILAT MOD SED  07/04/2017  . IR INTRA CRAN STENT  07/07/2017  . IR RADIOLOGIST EVAL & MGMT  08/13/2017  . LAPAROSCOPIC GASTRIC BANDING  10/02/2010   start weight 349#  . RADIOLOGY WITH ANESTHESIA N/A 07/07/2017   Procedure: STENTING;  Surgeon: Luanne Bras, MD;  Location: [facility];  Service: Radiology;  Laterality: N/A;  . SALPINGOOPHORECTOMY  10/08/11   right    SOCIAL HISTORY: Social History    Tobacco Use  . Smoking status: Never Smoker  . Smokeless tobacco: Never Used  Substance Use Topics  . Alcohol use: No    Alcohol/week: 0.0 standard drinks  . Drug use: No    FAMILY HISTORY: Family History  Problem Relation Age of Onset  . Heart disease Mother   . Hypertension Mother   . Diabetes Mother   . Stroke Father 76  . Diabetes Father   . Hypertension Father   . Hyperlipidemia Father   . Heart disease Father   . Kidney disease Father   . Cancer Father   . Liver disease Father   . Obesity Father     ROS: Review of Systems  Constitutional: Negative for weight loss.  Cardiovascular: Negative for chest pain.       Negative chest pressure  Neurological: Negative for dizziness.       Negative lightheadedness  Endo/Heme/Allergies:       Negative hypoglycemia    PHYSICAL EXAM: Pt in no acute distress  RECENT LABS AND TESTS: BMET    Component Value Date/Time   NA 142 07/01/2018 1412   K 3.7 07/01/2018 1412   CL 103 07/01/2018 1412   CO2 21 07/01/2018 1412   GLUCOSE 173 (H) 07/01/2018 1412   GLUCOSE 249 (H) 01/05/2018 1013   BUN 10 07/01/2018 1412   CREATININE 0.75 07/01/2018 1412   CALCIUM 9.2 07/01/2018 1412   GFRNONAA 97 07/01/2018 1412   GFRAA 112 07/01/2018 1412   Lab Results  Component Value Date   HGBA1C 9.7 (A) 07/09/2018   HGBA1C 9.2 (A) 04/09/2018   HGBA1C 11.1 (H) 01/05/2018   HGBA1C 11.2 (H) 10/02/2017   HGBA1C 8.5 (H) 07/01/2017   Lab Results  Component Value Date   INSULIN 8.9 07/01/2018   CBC    Component Value Date/Time   WBC 9.9 07/01/2018 1412   WBC 8.7 01/05/2018 1013   RBC 5.24 07/01/2018 1412   RBC 5.10 01/05/2018 1013   HGB 13.5 07/01/2018 1412   HCT 42.7 07/01/2018 1412   PLT 314.0 01/05/2018 1013   MCV 82 07/01/2018 1412   MCH 25.8 (L) 07/01/2018 1412   MCH 26.7 07/09/2017 0606   MCHC 31.6 07/01/2018 1412   MCHC 32.5 01/05/2018 1013   RDW 13.7 07/01/2018 1412   LYMPHSABS 2.2 07/01/2018 1412   MONOABS 0.3  07/08/2017 0607   EOSABS 0.3 07/01/2018 1412   BASOSABS 0.1 07/01/2018 1412   Iron/TIBC/Ferritin/ %Sat No  results found for: IRON, TIBC, FERRITIN, IRONPCTSAT Lipid Panel     Component Value Date/Time   CHOL 156 05/20/2018 1321   TRIG 73 05/20/2018 1321   HDL 56 05/20/2018 1321   CHOLHDL 2.8 05/20/2018 1321   CHOLHDL 6.2 07/01/2017 0800   VLDL 12 07/01/2017 0800   LDLCALC 85 05/20/2018 1321   Hepatic Function Panel     Component Value Date/Time   PROT 6.7 07/01/2018 1412   ALBUMIN 4.2 07/01/2018 1412   AST 14 07/01/2018 1412   ALT 19 07/01/2018 1412   ALKPHOS 109 07/01/2018 1412   BILITOT 0.3 07/01/2018 1412   BILIDIR 0.1 04/18/2015 1140      Component Value Date/Time   TSH 1.620 07/01/2018 1412   TSH 2.54 04/18/2015 1140   TSH 1.66 07/22/2013 1215      I, Trixie Dredge, am acting as transcriptionist for Ilene Qua, MD  I have reviewed the above documentation for accuracy and completeness, and I agree with the above. - Ilene Qua, MD

## 2018-09-10 ENCOUNTER — Encounter (INDEPENDENT_AMBULATORY_CARE_PROVIDER_SITE_OTHER): Payer: Self-pay | Admitting: Family Medicine

## 2018-09-10 ENCOUNTER — Ambulatory Visit (INDEPENDENT_AMBULATORY_CARE_PROVIDER_SITE_OTHER): Payer: BC Managed Care – PPO | Admitting: Family Medicine

## 2018-09-10 ENCOUNTER — Other Ambulatory Visit: Payer: Self-pay

## 2018-09-10 DIAGNOSIS — E119 Type 2 diabetes mellitus without complications: Secondary | ICD-10-CM

## 2018-09-10 DIAGNOSIS — E559 Vitamin D deficiency, unspecified: Secondary | ICD-10-CM

## 2018-09-10 DIAGNOSIS — Z6841 Body Mass Index (BMI) 40.0 and over, adult: Secondary | ICD-10-CM | POA: Diagnosis not present

## 2018-09-10 MED ORDER — SEMAGLUTIDE 7 MG PO TABS: 7.0000 mg | ORAL_TABLET | Freq: Every day | ORAL | 0 refills | Status: DC

## 2018-09-14 NOTE — Progress Notes (Signed)
Office: [phone]  /  Fax: [phone] TeleHealth Visit:  Debra [name] has verbally consented to this TeleHealth visit today. The patient is located at home, the provider is located at the [employer] [facility] office. The participants in this visit include the listed provider and patient. The visit was conducted today via face time.  HPI:   Chief Complaint: OBESITY Debra [name] is here to discuss her progress with her obesity treatment plan. She is on the Category 3 plan and is following her eating plan approximately 0 % of the time (unsure). She states she is exercising 0 minutes 0 times per week. Debra [name] is not weighing at home, but feels she has maintained her weight. She is not really walking much secondary to knee pain. She has been able to find all the food on the plan. She voices she isn't able to eat all of the food on the plan. She reports her blood pressure is 135/85. We were unable to weigh the patient today for this TeleHealth visit. She feels as if she has maintained her weight since her last visit. She has lost 0 lbs since starting treatment with [name].  Diabetes II with Hyperglycemia Debra [name] has a diagnosis of diabetes type II. Debra [name] states fasting BGs range between 100 and 110. She denies hypoglycemic episodes. She denies GI side effects of Rybelsus. Last A1c was 9.7. She has been working on intensive lifestyle modifications including diet, exercise, and weight loss to help control her blood glucose levels.  Vitamin D Deficiency Debra [name] has a diagnosis of vitamin D deficiency. She is currently taking prescription Vit D. She notes fatigue and denies nausea, vomiting or muscle weakness.  ASSESSMENT AND PLAN:  Vitamin D deficiency  Type 2 diabetes mellitus without complication, without long-term current use of insulin (HCC) - Plan: Semaglutide (RYBELSUS) 7 MG TABS  Class 3 severe obesity with serious comorbidity and body mass index (BMI) of 50.0 to 59.9 in adult,  unspecified obesity type ([place])  PLAN:  Diabetes II with Hyperglycemia Debra [name] has been given extensive diabetes education by myself today including ideal fasting and post-prandial blood glucose readings, individual ideal Hgb A1c goals and hypoglycemia prevention. We discussed the importance of good blood sugar control to decrease the likelihood of diabetic complications such as nephropathy, neuropathy, limb loss, blindness, coronary artery disease, and death. We discussed the importance of intensive lifestyle modification including diet, exercise and weight loss as the first line treatment for diabetes. Debra [name] agrees to continue taking Rybelsus 7 mg PO q AM #30 and we will refill for 1 month, and she agrees to stop Glipizide. Debra [name] agrees to follow up with our clinic in 3 weeks.  Vitamin D Deficiency Debra [name] was informed that low vitamin D levels contributes to fatigue and are associated with obesity, breast, and colon cancer. Debra [name] agrees to continue taking prescription Vit D '@50' ,000 IU every week, no refill needed. She will follow up for routine testing of vitamin D, at least 2-3 times per year. She was informed of the risk of over-replacement of vitamin D and agrees to not increase her dose unless she discusses this with [name] first. Debra [name] agrees to follow up with our clinic in 3 weeks.  Obesity Debra [name] is currently in the action stage of change. As such, her goal is to continue with weight loss efforts She has agreed to follow the Category 3 plan Debra [name] has been instructed to work up to a goal of 150 minutes of combined cardio and strengthening exercise per week  or physical activity as tolerated for knee pain for weight loss and overall health benefits. We discussed the following Behavioral Modification Strategies today: increasing lean protein intake, increasing vegetables and work on meal planning and easy cooking plans, keeping healthy foods in the home, better snacking choices, and  planning for success   Debra [name] has agreed to follow up with our clinic in 3 weeks. She was informed of the importance of frequent follow up visits to maximize her success with intensive lifestyle modifications for her multiple health conditions.  ALLERGIES: Allergies  Allergen Reactions  . Influenza Vaccines   . Metformin And Related Diarrhea  . Morphine Hives  . Augmentin [Amoxicillin-Pot Clavulanate] Rash    To bilat post hands only    MEDICATIONS: Current Outpatient Medications on File Prior to Visit  Medication Sig Dispense Refill  . albuterol (PROAIR HFA) 108 (90 BASE) MCG/ACT inhaler Inhale 2 puffs into the lungs every 4 (four) hours as needed for wheezing or shortness of breath. 18 g 2  . albuterol (PROVENTIL) (2.5 MG/3ML) 0.083% nebulizer solution Take 2.5 mg by nebulization every 6 (six) hours as needed for wheezing or shortness of breath.    [name] amLODipine (NORVASC) 10 MG tablet Take 1 tablet (10 mg total) by mouth daily. 90 tablet 3  . aspirin EC 81 MG tablet Take 81 mg by mouth daily.    . blood glucose meter kit and supplies Dispense based on patient and insurance preference. Use up to four times daily as directed. (FOR ICD-9 250.00, 250.01). 1 each 0  . Diclofenac Sodium (PENNSAID) 2 % SOLN Place 1 application onto the skin 2 (two) times daily. 1 Bottle 3  . Docusate Sodium (COLACE PO) Take by mouth.    . fexofenadine (ALLEGRA) 180 MG tablet Take 180 mg by mouth daily as needed. For seasonal allergies    . fluconazole (DIFLUCAN) 150 MG tablet Take 1 tablet (150 mg total) by mouth daily. 10 tablet 0  . fluticasone (FLONASE) 50 MCG/ACT nasal spray Place 2 sprays into both nostrils daily. 16 g 6  . glucose blood (ONE TOUCH ULTRA TEST) test strip Use as directed 100 each 2  . glucose blood test strip Used to check blood sugars up to four times daily as needed 200 each 3  . ibuprofen (ADVIL,MOTRIN) 800 MG tablet ibuprofen 800 mg tablet    . Insulin Pen Needle (B-D ULTRAFINE III  SHORT PEN) 31G X 8 MM MISC USE AS DIRECTED TO INJECT INSULIN. 100 each 0  . Lancets MISC Used to check blood sugars up to four times daily as needed 200 each 3  . losartan (COZAAR) 100 MG tablet TAKE 1 TABLET(100 MG) BY MOUTH DAILY 90 tablet 1  . nystatin-triamcinolone ointment (MYCOLOG) Apply 1 application topically 2 (two) times daily. 60 g 0  . olopatadine (PATANOL) 0.1 % ophthalmic solution Apply to eye.    . rosuvastatin (CRESTOR) 40 MG tablet Take 1 tablet (40 mg total) by mouth daily. 30 tablet 3  . ticagrelor (BRILINTA) 90 MG TABS tablet Take 0.5 tablets (45 mg total) by mouth 2 (two) times daily. 60 tablet 0  . Vitamin D, Ergocalciferol, (DRISDOL) 1.25 MG (50000 UT) CAPS capsule Take 1 capsule (50,000 Units total) by mouth every 7 (seven) days. 12 capsule 0   No current facility-administered medications on file prior to visit.     PAST MEDICAL HISTORY: Past Medical History:  Diagnosis Date  . Allergic rhinitis, cause unspecified   . Arthritis   .  Asthma   . Diabetes mellitus type II 08/2009 dx  . Endometriosis   . Hyperlipemia   . Hypertension   . Knee pain, right    DJD, post traumatic with repeat falls  . Lactose intolerance   . Morbid obesity ([place])    s/p lab band 09/2010  . Osteoarthritis   . Ovarian cyst    right s/p resection June 2013  . Stroke [facility])     PAST SURGICAL HISTORY: Past Surgical History:  Procedure Laterality Date  . ABDOMINAL HYSTERECTOMY  05/14/12   [place]  . IR ANGIO INTRA EXTRACRAN SEL COM CAROTID INNOMINATE BILAT MOD SED  07/04/2017  . IR ANGIO VERTEBRAL SEL VERTEBRAL BILAT MOD SED  07/04/2017  . IR INTRA CRAN STENT  07/07/2017  . IR RADIOLOGIST EVAL & MGMT  08/13/2017  . LAPAROSCOPIC GASTRIC BANDING  10/02/2010   start weight 349#  . RADIOLOGY WITH ANESTHESIA N/A 07/07/2017   Procedure: STENTING;  Surgeon: Luanne Bras, MD;  Location: [facility];  Service: Radiology;  Laterality: N/A;  . SALPINGOOPHORECTOMY  10/08/11   right     SOCIAL HISTORY: Social History   Tobacco Use  . Smoking status: Never Smoker  . Smokeless tobacco: Never Used  Substance Use Topics  . Alcohol use: No    Alcohol/week: 0.0 standard drinks  . Drug use: No    FAMILY HISTORY: Family History  Problem Relation Age of Onset  . Heart disease Mother   . Hypertension Mother   . Diabetes Mother   . Stroke Father 65  . Diabetes Father   . Hypertension Father   . Hyperlipidemia Father   . Heart disease Father   . Kidney disease Father   . Cancer Father   . Liver disease Father   . Obesity Father     ROS: Review of Systems  Constitutional: Positive for malaise/fatigue. Negative for weight loss.  Gastrointestinal: Negative for nausea and vomiting.  Musculoskeletal:       Negative muscle weakness + Knee pain  Endo/Heme/Allergies:       Negative hypoglycemia    PHYSICAL EXAM: Pt in no acute distress  RECENT LABS AND TESTS: BMET    Component Value Date/Time   NA 142 07/01/2018 1412   K 3.7 07/01/2018 1412   CL 103 07/01/2018 1412   CO2 21 07/01/2018 1412   GLUCOSE 173 (H) 07/01/2018 1412   GLUCOSE 249 (H) 01/05/2018 1013   BUN 10 07/01/2018 1412   CREATININE 0.75 07/01/2018 1412   CALCIUM 9.2 07/01/2018 1412   GFRNONAA 97 07/01/2018 1412   GFRAA 112 07/01/2018 1412   Lab Results  Component Value Date   HGBA1C 9.7 (A) 07/09/2018   HGBA1C 9.2 (A) 04/09/2018   HGBA1C 11.1 (H) 01/05/2018   HGBA1C 11.2 (H) 10/02/2017   HGBA1C 8.5 (H) 07/01/2017   Lab Results  Component Value Date   INSULIN 8.9 07/01/2018   CBC    Component Value Date/Time   WBC 9.9 07/01/2018 1412   WBC 8.7 01/05/2018 1013   RBC 5.24 07/01/2018 1412   RBC 5.10 01/05/2018 1013   HGB 13.5 07/01/2018 1412   HCT 42.7 07/01/2018 1412   PLT 314.0 01/05/2018 1013   MCV 82 07/01/2018 1412   MCH 25.8 (L) 07/01/2018 1412   MCH 26.7 07/09/2017 0606   MCHC 31.6 07/01/2018 1412   MCHC 32.5 01/05/2018 1013   RDW 13.7 07/01/2018 1412   LYMPHSABS 2.2  07/01/2018 1412   MONOABS 0.3 07/08/2017 6553  EOSABS 0.3 07/01/2018 1412   BASOSABS 0.1 07/01/2018 1412   Iron/TIBC/Ferritin/ %Sat No results found for: IRON, TIBC, FERRITIN, IRONPCTSAT Lipid Panel     Component Value Date/Time   CHOL 156 05/20/2018 1321   TRIG 73 05/20/2018 1321   HDL 56 05/20/2018 1321   CHOLHDL 2.8 05/20/2018 1321   CHOLHDL 6.2 07/01/2017 0800   VLDL 12 07/01/2017 0800   LDLCALC 85 05/20/2018 1321   Hepatic Function Panel     Component Value Date/Time   PROT 6.7 07/01/2018 1412   ALBUMIN 4.2 07/01/2018 1412   AST 14 07/01/2018 1412   ALT 19 07/01/2018 1412   ALKPHOS 109 07/01/2018 1412   BILITOT 0.3 07/01/2018 1412   BILIDIR 0.1 04/18/2015 1140      Component Value Date/Time   TSH 1.620 07/01/2018 1412   TSH 2.54 04/18/2015 1140   TSH 1.66 07/22/2013 1215      I, Trixie Dredge, am acting as transcriptionist for Ilene Qua, MD  I have reviewed the above documentation for accuracy and completeness, and I agree with the above. - Ilene Qua, MD

## 2018-09-26 ENCOUNTER — Other Ambulatory Visit: Payer: Self-pay | Admitting: Internal Medicine

## 2018-09-30 ENCOUNTER — Encounter (INDEPENDENT_AMBULATORY_CARE_PROVIDER_SITE_OTHER): Payer: Self-pay | Admitting: Family Medicine

## 2018-09-30 ENCOUNTER — Other Ambulatory Visit: Payer: Self-pay

## 2018-09-30 ENCOUNTER — Ambulatory Visit (INDEPENDENT_AMBULATORY_CARE_PROVIDER_SITE_OTHER): Payer: BC Managed Care – PPO | Admitting: Family Medicine

## 2018-09-30 DIAGNOSIS — Z6841 Body Mass Index (BMI) 40.0 and over, adult: Secondary | ICD-10-CM | POA: Diagnosis not present

## 2018-09-30 DIAGNOSIS — I10 Essential (primary) hypertension: Secondary | ICD-10-CM | POA: Diagnosis not present

## 2018-09-30 DIAGNOSIS — E1165 Type 2 diabetes mellitus with hyperglycemia: Secondary | ICD-10-CM

## 2018-10-01 NOTE — Progress Notes (Signed)
Office: [phone]  /  Fax: [phone] TeleHealth Visit:  Debra [name] has verbally consented to this TeleHealth visit today. The patient is located at home, the provider is located at the [employer] [facility] office. The participants in this visit include the listed provider and patient. The visit was conducted today via face time.  HPI:   Chief Complaint: OBESITY Debra [name] is here to discuss her progress with her obesity treatment plan. She is on the Category 3 plan and is following her eating plan approximately 80 % of the time. She states she is walking for 30 minutes 3-4 times per week. Debra [name] finds that following the plan has gotten easier as she has gotten more creative. She did go to the beach last weekend. Her weight is of 325 lbs. She is looking for other options for new meal plan.  We were unable to weigh the patient today for this TeleHealth visit. She feels as if she has maintained her weight since her last visit. She has lost 0 lbs since starting treatment with [name].  Diabetes II with Hyperglycemia Debra [name] has a diagnosis of diabetes type II. Debra [name] states her fasting BGs range between 135 and 140, with 1 episode of hypoglycemia secondary to not eating. She denies side effects of her medications. She is on Rybelsus 7 mg. Last A1c was 9.7. She has been working on intensive lifestyle modifications including diet, exercise, and weight loss to help control her blood glucose levels.  Hypertension Debra [name] is a 43 y.o. female with hypertension. Debra [name]'s blood pressure is well controlled at home (122/82). She denies chest pain, chest pressure, or headaches. She is working on weight loss to help control her blood pressure with the goal of decreasing her risk of heart attack and stroke.   ASSESSMENT AND PLAN:  Type 2 diabetes mellitus with hyperglycemia, without long-term current use of insulin (HCC)  Essential hypertension  Class 3 severe obesity with serious  comorbidity and body mass index (BMI) of 50.0 to 59.9 in adult, unspecified obesity type ([place])  PLAN:  Diabetes II with Hyperglycemia Tracina has been given extensive diabetes education by myself today including ideal fasting and post-prandial blood glucose readings, individual ideal Hgb A1c goals and hypoglycemia prevention. We discussed the importance of good blood sugar control to decrease the likelihood of diabetic complications such as nephropathy, neuropathy, limb loss, blindness, coronary artery disease, and death. We discussed the importance of intensive lifestyle modification including diet, exercise and weight loss as the first line treatment for diabetes. Zarayah agrees to continue her current diabetes medications, and she agrees to follow up with our clinic in 2 weeks.  Hypertension We discussed sodium restriction, working on healthy weight loss, and a regular exercise program as the means to achieve improved blood pressure control. Debra [name] agreed with this plan and agreed to follow up as directed. We will continue to monitor her blood pressure as well as her progress with the above lifestyle modifications. Debra [name] agrees to continue her current medications and will watch for signs of hypotension as she continues her lifestyle modifications. Debra [name] agrees to follow up with our clinic in 2 weeks.  Obesity Debra [name] is currently in the action stage of change. As such, her goal is to continue with weight loss efforts She has agreed to follow the Pescatarian eating plan + 300 calories Debra [name] has been instructed to work up to a goal of 150 minutes of combined cardio and strengthening exercise per week or continue physical activity 3-4  times per week for weight loss and overall health benefits. We discussed the following Behavioral Modification Strategies today: increasing lean protein intake, increasing vegetables and work on meal planning and easy cooking plans, keeping healthy foods in the  home, and planning for success   Debra [name] has agreed to follow up with our clinic in 2 weeks. She was informed of the importance of frequent follow up visits to maximize her success with intensive lifestyle modifications for her multiple health conditions.  ALLERGIES: Allergies  Allergen Reactions  . Influenza Vaccines   . Metformin And Related Diarrhea  . Morphine Hives  . Augmentin [Amoxicillin-Pot Clavulanate] Rash    To bilat post hands only    MEDICATIONS: Current Outpatient Medications on File Prior to Visit  Medication Sig Dispense Refill  . albuterol (PROAIR HFA) 108 (90 BASE) MCG/ACT inhaler Inhale 2 puffs into the lungs every 4 (four) hours as needed for wheezing or shortness of breath. 18 g 2  . albuterol (PROVENTIL) (2.5 MG/3ML) 0.083% nebulizer solution Take 2.5 mg by nebulization every 6 (six) hours as needed for wheezing or shortness of breath.    [name] amLODipine (NORVASC) 10 MG tablet Take 1 tablet (10 mg total) by mouth daily. 90 tablet 3  . aspirin EC 81 MG tablet Take 81 mg by mouth daily.    . blood glucose meter kit and supplies Dispense based on patient and insurance preference. Use up to four times daily as directed. (FOR ICD-9 250.00, 250.01). 1 each 0  . Diclofenac Sodium (PENNSAID) 2 % SOLN Place 1 application onto the skin 2 (two) times daily. 1 Bottle 3  . Docusate Sodium (COLACE PO) Take by mouth.    . fexofenadine (ALLEGRA) 180 MG tablet Take 180 mg by mouth daily as needed. For seasonal allergies    . fluconazole (DIFLUCAN) 150 MG tablet Take 1 tablet (150 mg total) by mouth daily. 10 tablet 0  . fluticasone (FLONASE) 50 MCG/ACT nasal spray Place 2 sprays into both nostrils daily. 16 g 6  . glucose blood (ONE TOUCH ULTRA TEST) test strip Use as directed 100 each 2  . glucose blood test strip Used to check blood sugars up to four times daily as needed 200 each 3  . ibuprofen (ADVIL,MOTRIN) 800 MG tablet ibuprofen 800 mg tablet    . Insulin Pen Needle (B-D  ULTRAFINE III SHORT PEN) 31G X 8 MM MISC USE AS DIRECTED TO INJECT INSULIN. 100 each 0  . Lancets MISC Used to check blood sugars up to four times daily as needed 200 each 3  . losartan (COZAAR) 100 MG tablet TAKE 1 TABLET(100 MG) BY MOUTH DAILY 90 tablet 1  . nystatin-triamcinolone ointment (MYCOLOG) Apply 1 application topically 2 (two) times daily. 60 g 0  . olopatadine (PATANOL) 0.1 % ophthalmic solution Apply to eye.    . rosuvastatin (CRESTOR) 40 MG tablet Take 1 tablet (40 mg total) by mouth daily. 30 tablet 3  . Semaglutide (RYBELSUS) 7 MG TABS Take 7 mg by mouth daily. 30 tablet 0  . ticagrelor (BRILINTA) 90 MG TABS tablet Take 0.5 tablets (45 mg total) by mouth 2 (two) times daily. 60 tablet 0  . Vitamin D, Ergocalciferol, (DRISDOL) 1.25 MG (50000 UT) CAPS capsule Take 1 capsule (50,000 Units total) by mouth every 7 (seven) days. 12 capsule 0   No current facility-administered medications on file prior to visit.     PAST MEDICAL HISTORY: Past Medical History:  Diagnosis Date  . Allergic rhinitis, cause  unspecified   . Arthritis   . Asthma   . Diabetes mellitus type II 08/2009 dx  . Endometriosis   . Hyperlipemia   . Hypertension   . Knee pain, right    DJD, post traumatic with repeat falls  . Lactose intolerance   . Morbid obesity ([place])    s/p lab band 09/2010  . Osteoarthritis   . Ovarian cyst    right s/p resection June 2013  . Stroke [facility])     PAST SURGICAL HISTORY: Past Surgical History:  Procedure Laterality Date  . ABDOMINAL HYSTERECTOMY  05/14/12   [place]  . IR ANGIO INTRA EXTRACRAN SEL COM CAROTID INNOMINATE BILAT MOD SED  07/04/2017  . IR ANGIO VERTEBRAL SEL VERTEBRAL BILAT MOD SED  07/04/2017  . IR INTRA CRAN STENT  07/07/2017  . IR RADIOLOGIST EVAL & MGMT  08/13/2017  . LAPAROSCOPIC GASTRIC BANDING  10/02/2010   start weight 349#  . RADIOLOGY WITH ANESTHESIA N/A 07/07/2017   Procedure: STENTING;  Surgeon: Luanne Bras, MD;  Location: [facility];  Service: Radiology;  Laterality: N/A;  . SALPINGOOPHORECTOMY  10/08/11   right    SOCIAL HISTORY: Social History   Tobacco Use  . Smoking status: Never Smoker  . Smokeless tobacco: Never Used  Substance Use Topics  . Alcohol use: No    Alcohol/week: 0.0 standard drinks  . Drug use: No    FAMILY HISTORY: Family History  Problem Relation Age of Onset  . Heart disease Mother   . Hypertension Mother   . Diabetes Mother   . Stroke Father 82  . Diabetes Father   . Hypertension Father   . Hyperlipidemia Father   . Heart disease Father   . Kidney disease Father   . Cancer Father   . Liver disease Father   . Obesity Father     ROS: Review of Systems  Constitutional: Negative for weight loss.  Cardiovascular: Negative for chest pain.       Negative chest pressure  Neurological: Negative for headaches.  Endo/Heme/Allergies:       Negative hypoglycemia    PHYSICAL EXAM: Pt in no acute distress  RECENT LABS AND TESTS: BMET    Component Value Date/Time   NA 142 07/01/2018 1412   K 3.7 07/01/2018 1412   CL 103 07/01/2018 1412   CO2 21 07/01/2018 1412   GLUCOSE 173 (H) 07/01/2018 1412   GLUCOSE 249 (H) 01/05/2018 1013   BUN 10 07/01/2018 1412   CREATININE 0.75 07/01/2018 1412   CALCIUM 9.2 07/01/2018 1412   GFRNONAA 97 07/01/2018 1412   GFRAA 112 07/01/2018 1412   Lab Results  Component Value Date   HGBA1C 9.7 (A) 07/09/2018   HGBA1C 9.2 (A) 04/09/2018   HGBA1C 11.1 (H) 01/05/2018   HGBA1C 11.2 (H) 10/02/2017   HGBA1C 8.5 (H) 07/01/2017   Lab Results  Component Value Date   INSULIN 8.9 07/01/2018   CBC    Component Value Date/Time   WBC 9.9 07/01/2018 1412   WBC 8.7 01/05/2018 1013   RBC 5.24 07/01/2018 1412   RBC 5.10 01/05/2018 1013   HGB 13.5 07/01/2018 1412   HCT 42.7 07/01/2018 1412   PLT 314.0 01/05/2018 1013   MCV 82 07/01/2018 1412   MCH 25.8 (L) 07/01/2018 1412   MCH 26.7 07/09/2017 0606   MCHC 31.6 07/01/2018 1412   MCHC 32.5  01/05/2018 1013   RDW 13.7 07/01/2018 1412   LYMPHSABS 2.2 07/01/2018 1412   MONOABS  0.3 07/08/2017 0607   EOSABS 0.3 07/01/2018 1412   BASOSABS 0.1 07/01/2018 1412   Iron/TIBC/Ferritin/ %Sat No results found for: IRON, TIBC, FERRITIN, IRONPCTSAT Lipid Panel     Component Value Date/Time   CHOL 156 05/20/2018 1321   TRIG 73 05/20/2018 1321   HDL 56 05/20/2018 1321   CHOLHDL 2.8 05/20/2018 1321   CHOLHDL 6.2 07/01/2017 0800   VLDL 12 07/01/2017 0800   LDLCALC 85 05/20/2018 1321   Hepatic Function Panel     Component Value Date/Time   PROT 6.7 07/01/2018 1412   ALBUMIN 4.2 07/01/2018 1412   AST 14 07/01/2018 1412   ALT 19 07/01/2018 1412   ALKPHOS 109 07/01/2018 1412   BILITOT 0.3 07/01/2018 1412   BILIDIR 0.1 04/18/2015 1140      Component Value Date/Time   TSH 1.620 07/01/2018 1412   TSH 2.54 04/18/2015 1140   TSH 1.66 07/22/2013 1215      I, Trixie Dredge, am acting as transcriptionist for Ilene Qua, MD  I have reviewed the above documentation for accuracy and completeness, and I agree with the above. - Ilene Qua, MD

## 2018-10-06 ENCOUNTER — Other Ambulatory Visit: Payer: Self-pay

## 2018-10-06 MED ORDER — ROSUVASTATIN CALCIUM 40 MG PO TABS: 40.0000 mg | ORAL_TABLET | Freq: Every day | ORAL | 0 refills | Status: DC

## 2018-10-08 ENCOUNTER — Telehealth: Payer: Self-pay

## 2018-10-08 NOTE — Telephone Encounter (Signed)
Can do in-patient or virtual with labs after

## 2018-10-08 NOTE — Telephone Encounter (Signed)
Copied from [place] [id number]. Topic: Appointment Scheduling - Scheduling Inquiry for Clinic >> Oct 07, 2018  4:34 PM Alanda Slim E wrote: Reason for CRM: Pt has a 3 month follow up appt on 6.19.20 and wants to know if its in office or virtual and if she should just schedule for lab work to be done / please advise

## 2018-10-08 NOTE — Telephone Encounter (Signed)
Do you need patient to come in to re-check sugars or are they able to do a virtual visit instead

## 2018-10-09 ENCOUNTER — Ambulatory Visit (INDEPENDENT_AMBULATORY_CARE_PROVIDER_SITE_OTHER): Payer: BC Managed Care – PPO | Admitting: Internal Medicine

## 2018-10-09 ENCOUNTER — Encounter: Payer: Self-pay | Admitting: Internal Medicine

## 2018-10-09 ENCOUNTER — Other Ambulatory Visit: Payer: Self-pay

## 2018-10-09 ENCOUNTER — Other Ambulatory Visit (INDEPENDENT_AMBULATORY_CARE_PROVIDER_SITE_OTHER): Payer: BC Managed Care – PPO

## 2018-10-09 VITALS — BP 104/60 | HR 73 | Temp 98.6°F | Ht 67.0 in | Wt 327.0 lb

## 2018-10-09 DIAGNOSIS — E1169 Type 2 diabetes mellitus with other specified complication: Secondary | ICD-10-CM

## 2018-10-09 DIAGNOSIS — E1165 Type 2 diabetes mellitus with hyperglycemia: Secondary | ICD-10-CM

## 2018-10-09 DIAGNOSIS — E785 Hyperlipidemia, unspecified: Secondary | ICD-10-CM

## 2018-10-09 LAB — POCT GLYCOSYLATED HEMOGLOBIN (HGB A1C): Hemoglobin A1C: 7.9 % — AB (ref 4.0–5.6)

## 2018-10-09 LAB — LIPID PANEL
Cholesterol: 90 mg/dL (ref 0–200)
HDL: 42.1 mg/dL (ref >39.00)
LDL Cholesterol: 37 mg/dL (ref 0–99)
NonHDL: 47.91
Total CHOL/HDL Ratio: 2
Triglycerides: 55 mg/dL (ref 0.0–149.0)
VLDL: 11 mg/dL (ref 0.0–40.0)

## 2018-10-09 NOTE — Assessment & Plan Note (Signed)
Still slightly above goal of 7.5 but much improved. Her HgA1c was done in the office and is 7.9 today which is great. She will continue on rybelsus for now. Work on diet and exercise also.

## 2018-10-09 NOTE — Assessment & Plan Note (Signed)
Needs lipid panel after changing to crestor 40 mg daily in January. Adjust as needed for LDL<100.

## 2018-10-09 NOTE — Progress Notes (Signed)
   Subjective:   Patient ID: Debra [name], female    DOB: 1975-12-24, 43 y.o.   MRN: [id number]  HPI The patient is a 43 YO female coming in for diabetes follow up. She is working on weight loss but has increased 5 pounds since our last visit. Her weight loss specialist has She was taking jardiance and glipizide and this was adjusted to rybelsus and was taking glipizide. She then started having borderline low sugars to 70s and the glipizide was stopped. She has had a little slide back due to pandemic but overall she is still trying hard with diet. She denies worsening numbness or weakness. Speech is still improving. Stress levels are manageable.   Review of Systems  Constitutional: Negative.   HENT: Negative.   Eyes: Negative.   Respiratory: Negative for cough, chest tightness and shortness of breath.   Cardiovascular: Negative for chest pain, palpitations and leg swelling.  Gastrointestinal: Negative for abdominal distention, abdominal pain, constipation, diarrhea, nausea and vomiting.  Musculoskeletal: Negative.   Skin: Negative.   Neurological: Negative.   Psychiatric/Behavioral: Negative.     Objective:  Physical Exam Constitutional:      Appearance: She is well-developed.  HENT:     Head: Normocephalic and atraumatic.  Neck:     Musculoskeletal: Normal range of motion.  Cardiovascular:     Rate and Rhythm: Normal rate and regular rhythm.  Pulmonary:     Effort: Pulmonary effort is normal. No respiratory distress.     Breath sounds: Normal breath sounds. No wheezing or rales.  Abdominal:     General: Bowel sounds are normal. There is no distension.     Palpations: Abdomen is soft.     Tenderness: There is no abdominal tenderness. There is no rebound.  Skin:    General: Skin is warm and dry.  Neurological:     Mental Status: She is alert and oriented to person, place, and time.     Coordination: Coordination normal.     Vitals:   10/09/18 1257  BP: 104/60  Pulse:  73  Temp: 98.6 F (37 C)  TempSrc: Oral  SpO2: 98%  Weight: (!) 327 lb (148.3 kg)  Height: 5\' 7"  (1.702 m)    Assessment & Plan:  Visit time 25 minutes: greater than 50% of that time was spent in face to face counseling and coordination of care with the patient: counseled about diabetes and diet and making sure she is still working on this and not beating herself up over slips

## 2018-10-09 NOTE — Patient Instructions (Addendum)
You are going great with the sugars! The HgA1c is 7.9

## 2018-10-13 ENCOUNTER — Other Ambulatory Visit: Payer: Self-pay

## 2018-10-13 ENCOUNTER — Telehealth (INDEPENDENT_AMBULATORY_CARE_PROVIDER_SITE_OTHER): Payer: BC Managed Care – PPO | Admitting: Family Medicine

## 2018-10-13 DIAGNOSIS — Z6841 Body Mass Index (BMI) 40.0 and over, adult: Secondary | ICD-10-CM

## 2018-10-13 DIAGNOSIS — I10 Essential (primary) hypertension: Secondary | ICD-10-CM | POA: Diagnosis not present

## 2018-10-13 DIAGNOSIS — E119 Type 2 diabetes mellitus without complications: Secondary | ICD-10-CM

## 2018-10-14 ENCOUNTER — Encounter (INDEPENDENT_AMBULATORY_CARE_PROVIDER_SITE_OTHER): Payer: Self-pay | Admitting: Family Medicine

## 2018-10-14 NOTE — Progress Notes (Signed)
Office: [phone]  /  Fax: [phone] TeleHealth Visit:  Debra [name] has verbally consented to this TeleHealth visit today. The patient is located at home, the provider is located at the [facility] office. The participants in this visit include the listed provider and patient. The visit was conducted today via FaceTime.  HPI:   Chief Complaint: OBESITY Debra [name] is here to discuss her progress with her obesity treatment plan. She is on the Pescatarian plan and is following her eating plan approximately 75-80% of the time. She states she is walking 30 minutes 3 times per week. Debra [name] saw Dr. Sharlet Salina in the office 4 days ago. She reports she never got a Pescatarian plan so she stayed on Category 3 with no obstacles in the next 2 weeks.  We were unable to weigh the patient today for this TeleHealth visit. She feels as if she has gained weight since her last visit. She has lost 0 lbs since starting treatment with [name].  Diabetes II With Hyperglycemia, Not On Insulin Debra [name] has a diagnosis of diabetes type II and is on Rybelsus. She reports some rebound hyperglycemia with cessation of Glipizide. Debra [name] does not report checking her blood sugars. She denies hypoglycemia. Last A1c was 7.9 on 10/09/2018. She has been working on intensive lifestyle modifications including diet, exercise, and weight loss to help control her blood glucose levels. She denies side effects with Rybelsus.  Hypertension Debra [name] is a 43 y.o. female with hypertension.  Laporsha Dianna [name] denies chest pain or shortness of breath on exertion. She is working weight loss to help control her blood pressure with the goal of decreasing her risk of heart attack and stroke. Debra [name]'s blood pressure is borderline low, ~952'W systolic.  ASSESSMENT AND PLAN:  No diagnosis found.  PLAN:  Diabetes II With Hyperglycemia, Not On Insulin [name] has been given extensive diabetes education by myself today  including ideal fasting and post-prandial blood glucose readings, individual ideal HgA1c goals  and hypoglycemia prevention. We discussed the importance of good blood sugar control to decrease the likelihood of diabetic complications such as nephropathy, neuropathy, limb loss, blindness, coronary artery disease, and death. We discussed the importance of intensive lifestyle modification including diet, exercise and weight loss as the first line treatment for diabetes. [name] was given a refill on her Rybelsus 7 mg PO daily #30 with 0 refills. She agrees to follow-up with our clinic in 2 weeks.  Hypertension We discussed sodium restriction, working on healthy weight loss, and a regular exercise program as the means to achieve improved blood pressure control. [name] agreed with this plan and agreed to follow up as directed. We will continue to monitor her blood pressure as well as her progress with the above lifestyle modifications. [name] will decrease her Amlodipine to 5 mg. She will cut pills in half. She will watch for signs of hypotension as she continues her lifestyle modifications.  Obesity Debra [name] is currently in the action stage of change. As such, her goal is to continue with weight loss efforts. She has agreed to follow the Pescatarian plan + 300 calories and journal 250-350 calories and 25+ grams of protein at breakfast. Debra [name] has been instructed to work up to a goal of 150 minutes of combined cardio and strengthening exercise per week for weight loss and overall health benefits. We discussed the following Behavioral Modification Strategies today: increasing lean protein intake, increasing vegetables, work on meal planning and easy cooking plans, keeping healthy foods in  the home, better snacking choices, and planning for success.  Debra [name] has agreed to follow-up with our clinic in 2 weeks. She was informed of the importance of frequent follow-up visits to maximize her success with  intensive lifestyle modifications for her multiple health conditions.  ALLERGIES: Allergies  Allergen Reactions  . Influenza Vaccines   . Metformin And Related Diarrhea  . Morphine Hives  . Augmentin [Amoxicillin-Pot Clavulanate] Rash    To bilat post hands only    MEDICATIONS: Current Outpatient Medications on File Prior to Visit  Medication Sig Dispense Refill  . albuterol (PROAIR HFA) 108 (90 BASE) MCG/ACT inhaler Inhale 2 puffs into the lungs every 4 (four) hours as needed for wheezing or shortness of breath. 18 g 2  . albuterol (PROVENTIL) (2.5 MG/3ML) 0.083% nebulizer solution Take 2.5 mg by nebulization every 6 (six) hours as needed for wheezing or shortness of breath.    [name] amLODipine (NORVASC) 10 MG tablet Take 1 tablet (10 mg total) by mouth daily. 90 tablet 3  . aspirin EC 81 MG tablet Take 81 mg by mouth daily.    . blood glucose meter kit and supplies Dispense based on patient and insurance preference. Use up to four times daily as directed. (FOR ICD-9 250.00, 250.01). 1 each 0  . Docusate Sodium (COLACE PO) Take by mouth.    . fexofenadine (ALLEGRA) 180 MG tablet Take 180 mg by mouth daily as needed. For seasonal allergies    . fluticasone (FLONASE) 50 MCG/ACT nasal spray Place 2 sprays into both nostrils daily. 16 g 6  . glucose blood test strip Used to check blood sugars up to four times daily as needed 200 each 3  . ibuprofen (ADVIL,MOTRIN) 800 MG tablet ibuprofen 800 mg tablet    . Lancets MISC Used to check blood sugars up to four times daily as needed 200 each 3  . losartan (COZAAR) 100 MG tablet TAKE 1 TABLET(100 MG) BY MOUTH DAILY 90 tablet 1  . olopatadine (PATANOL) 0.1 % ophthalmic solution Apply to eye.    . rosuvastatin (CRESTOR) 40 MG tablet Take 1 tablet (40 mg total) by mouth daily. 90 tablet 0  . Semaglutide (RYBELSUS) 7 MG TABS Take 7 mg by mouth daily. 30 tablet 0  . ticagrelor (BRILINTA) 90 MG TABS tablet Take 0.5 tablets (45 mg total) by mouth 2 (two)  times daily. 60 tablet 0   No current facility-administered medications on file prior to visit.     PAST MEDICAL HISTORY: Past Medical History:  Diagnosis Date  . Allergic rhinitis, cause unspecified   . Arthritis   . Asthma   . Diabetes mellitus type II 08/2009 dx  . Endometriosis   . Hyperlipemia   . Hypertension   . Knee pain, right    DJD, post traumatic with repeat falls  . Lactose intolerance   . Morbid obesity ([place])    s/p lab band 09/2010  . Osteoarthritis   . Ovarian cyst    right s/p resection June 2013  . Stroke [facility])     PAST SURGICAL HISTORY: Past Surgical History:  Procedure Laterality Date  . ABDOMINAL HYSTERECTOMY  05/14/12   [place] [facility]  . IR ANGIO INTRA EXTRACRAN SEL COM CAROTID INNOMINATE BILAT MOD SED  07/04/2017  . IR ANGIO VERTEBRAL SEL VERTEBRAL BILAT MOD SED  07/04/2017  . IR INTRA CRAN STENT  07/07/2017  . IR RADIOLOGIST EVAL & MGMT  08/13/2017  . LAPAROSCOPIC GASTRIC BANDING  10/02/2010   start  weight 349#  . RADIOLOGY WITH ANESTHESIA N/A 07/07/2017   Procedure: STENTING;  Surgeon: Luanne Bras, MD;  Location: [facility];  Service: Radiology;  Laterality: N/A;  . SALPINGOOPHORECTOMY  10/08/11   right    SOCIAL HISTORY: Social History   Tobacco Use  . Smoking status: Never Smoker  . Smokeless tobacco: Never Used  Substance Use Topics  . Alcohol use: No    Alcohol/week: 0.0 standard drinks  . Drug use: No    FAMILY HISTORY: Family History  Problem Relation Age of Onset  . Heart disease Mother   . Hypertension Mother   . Diabetes Mother   . Stroke Father 79  . Diabetes Father   . Hypertension Father   . Hyperlipidemia Father   . Heart disease Father   . Kidney disease Father   . Cancer Father   . Liver disease Father   . Obesity Father    ROS: Review of Systems  Endo/Heme/Allergies:       Negative for hypoglycemia.   PHYSICAL EXAM: Pt in no acute distress  RECENT LABS AND TESTS: BMET    Component Value Date/Time    NA 142 07/01/2018 1412   K 3.7 07/01/2018 1412   CL 103 07/01/2018 1412   CO2 21 07/01/2018 1412   GLUCOSE 173 (H) 07/01/2018 1412   GLUCOSE 249 (H) 01/05/2018 1013   BUN 10 07/01/2018 1412   CREATININE 0.75 07/01/2018 1412   CALCIUM 9.2 07/01/2018 1412   GFRNONAA 97 07/01/2018 1412   GFRAA 112 07/01/2018 1412   Lab Results  Component Value Date   HGBA1C 7.9 (A) 10/09/2018   HGBA1C 9.7 (A) 07/09/2018   HGBA1C 9.2 (A) 04/09/2018   HGBA1C 11.1 (H) 01/05/2018   HGBA1C 11.2 (H) 10/02/2017   Lab Results  Component Value Date   INSULIN 8.9 07/01/2018   CBC    Component Value Date/Time   WBC 9.9 07/01/2018 1412   WBC 8.7 01/05/2018 1013   RBC 5.24 07/01/2018 1412   RBC 5.10 01/05/2018 1013   HGB 13.5 07/01/2018 1412   HCT 42.7 07/01/2018 1412   PLT 314.0 01/05/2018 1013   MCV 82 07/01/2018 1412   MCH 25.8 (L) 07/01/2018 1412   MCH 26.7 07/09/2017 0606   MCHC 31.6 07/01/2018 1412   MCHC 32.5 01/05/2018 1013   RDW 13.7 07/01/2018 1412   LYMPHSABS 2.2 07/01/2018 1412   MONOABS 0.3 07/08/2017 0607   EOSABS 0.3 07/01/2018 1412   BASOSABS 0.1 07/01/2018 1412   Iron/TIBC/Ferritin/ %Sat No results found for: IRON, TIBC, FERRITIN, IRONPCTSAT Lipid Panel     Component Value Date/Time   CHOL 90 10/09/2018 1328   CHOL 156 05/20/2018 1321   TRIG 55.0 10/09/2018 1328   HDL 42.10 10/09/2018 1328   HDL 56 05/20/2018 1321   CHOLHDL 2 10/09/2018 1328   VLDL 11.0 10/09/2018 1328   LDLCALC 37 10/09/2018 1328   LDLCALC 85 05/20/2018 1321   Hepatic Function Panel     Component Value Date/Time   PROT 6.7 07/01/2018 1412   ALBUMIN 4.2 07/01/2018 1412   AST 14 07/01/2018 1412   ALT 19 07/01/2018 1412   ALKPHOS 109 07/01/2018 1412   BILITOT 0.3 07/01/2018 1412   BILIDIR 0.1 04/18/2015 1140      Component Value Date/Time   TSH 1.620 07/01/2018 1412   TSH 2.54 04/18/2015 1140   TSH 1.66 07/22/2013 1215   Results for Sago, Melessa L (MRN [id number]) as of 10/14/2018 14:01   Ref. Range 07/01/2018  14:12  Vitamin D, 25-Hydroxy Latest Ref Range: 30.0 - 100.0 ng/mL 22.6 (L)    I, Michaelene Song, am acting as Location manager for Ilene Qua, MD   I have reviewed the above documentation for accuracy and completeness, and I agree with the above. - Ilene Qua, MD

## 2018-10-15 MED ORDER — RYBELSUS 7 MG PO TABS: 7.0000 mg | ORAL_TABLET | Freq: Every day | ORAL | 0 refills | Status: DC

## 2018-10-28 ENCOUNTER — Encounter (INDEPENDENT_AMBULATORY_CARE_PROVIDER_SITE_OTHER): Payer: Self-pay | Admitting: Family Medicine

## 2018-10-28 ENCOUNTER — Ambulatory Visit (INDEPENDENT_AMBULATORY_CARE_PROVIDER_SITE_OTHER): Payer: BC Managed Care – PPO | Admitting: Family Medicine

## 2018-10-28 ENCOUNTER — Other Ambulatory Visit: Payer: Self-pay

## 2018-10-28 VITALS — BP 109/73 | HR 69 | Temp 97.9°F | Ht 67.0 in | Wt 323.0 lb

## 2018-10-28 DIAGNOSIS — E119 Type 2 diabetes mellitus without complications: Secondary | ICD-10-CM | POA: Diagnosis not present

## 2018-10-28 DIAGNOSIS — I10 Essential (primary) hypertension: Secondary | ICD-10-CM | POA: Diagnosis not present

## 2018-10-28 DIAGNOSIS — Z6841 Body Mass Index (BMI) 40.0 and over, adult: Secondary | ICD-10-CM

## 2018-10-28 NOTE — Progress Notes (Signed)
Office: [phone]  /  Fax: [phone]   HPI:   Chief Complaint: OBESITY Debra [name] is here to discuss her progress with her obesity treatment plan. She is on the keep a food journal with 250-350 calories and 25+ grams of protein at breakfast daily and follow the Pescatarian eating plan + 300 calories and is following her eating plan approximately 75-80 % of the time. She states she is walking for 30 minutes 1-3 times per week. Debra [name] voices the last few weeks were "alright". She is going back and forth between Category 3 and Pescatarian. For breakfast she is doing just ad an egg. She denies being hungry. She is often skipping meals, mostly lunch. Last night she did salad with meat with only 1/2 chicken breast, and chips after dinner.  Her weight is (!) 323 lb (146.5 kg) today and has gained 1 lb since her last visit. She has lost 0 lbs since starting treatment with [name].  Diabetes II with Hyperglycemia Debra [name] has a diagnosis of diabetes type II. Debra [name] states fasting BGs range between 150 and 160's. She is sometimes forgetting to check her BGs. She denies hypoglycemia. Last A1c was 7.9. She has been working on intensive lifestyle modifications including diet, exercise, and weight loss to help control her blood glucose levels.  Hypertension Debra [name] is a 43 y.o. female with hypertension. Debra [name] increased amlodipine to 10 mg after her blood pressure went up. She denies chest pain, chest pressure, or headaches. She is working on weight loss to help control her blood pressure with the goal of decreasing her risk of heart attack and stroke.   ASSESSMENT AND PLAN:  Essential hypertension  Type 2 diabetes mellitus without complication, without long-term current use of insulin (HCC)  Class 3 severe obesity with serious comorbidity and body mass index (BMI) of 50.0 to 59.9 in adult, unspecified obesity type ([place])  PLAN:  Diabetes II with Hyperglycemia Debra [name] has been given extensive  diabetes education by myself today including ideal fasting and post-prandial blood glucose readings, individual ideal Hgb A1c goals and hypoglycemia prevention. We discussed the importance of good blood sugar control to decrease the likelihood of diabetic complications such as nephropathy, neuropathy, limb loss, blindness, coronary artery disease, and death. We discussed the importance of intensive lifestyle modification including diet, exercise and weight loss as the first line treatment for diabetes. Debra [name] agrees to continue taking Rybelsus, and she is to continue checking her blood sugar BID. Debra [name] agrees to follow up with our clinic in 2 weeks.  Hypertension We discussed sodium restriction, working on healthy weight loss, and a regular exercise program as the means to achieve improved blood pressure control. [name] agreed with this plan and agreed to follow up as directed. We will continue to monitor her blood pressure as well as her progress with the above lifestyle modifications. Debra [name] agrees to continue taking amlodipine, if her blood pressure is controlled at next visit we will decrease amlodipine to 7.5 mg. She will watch for signs of hypotension as she continues her lifestyle modifications. Debra [name] agrees to follow up with our clinic in 2 weeks.  Obesity Debra [name] is currently in the action stage of change. As such, her goal is to continue with weight loss efforts She has agreed to follow the Category 3 plan or follow the Pescatarian eating plan + 300 calories Debra [name] is to set an alarm on her watch to eat every 3 hours. Debra [name] has been instructed to work up to a goal of 150  minutes of combined cardio and strengthening exercise per week for weight loss and overall health benefits. We discussed the following Behavioral Modification Strategies today: increasing lean protein intake, increasing vegetables and work on meal planning and easy cooking plans, no skipping meals, and planning for  success   Debra [name] has agreed to follow up with our clinic in 2 weeks. She was informed of the importance of frequent follow up visits to maximize her success with intensive lifestyle modifications for her multiple health conditions.  ALLERGIES: Allergies  Allergen Reactions  . Influenza Vaccines   . Metformin And Related Diarrhea  . Morphine Hives  . Augmentin [Amoxicillin-Pot Clavulanate] Rash    To bilat post hands only    MEDICATIONS: Current Outpatient Medications on File Prior to Visit  Medication Sig Dispense Refill  . albuterol (PROAIR HFA) 108 (90 BASE) MCG/ACT inhaler Inhale 2 puffs into the lungs every 4 (four) hours as needed for wheezing or shortness of breath. 18 g 2  . albuterol (PROVENTIL) (2.5 MG/3ML) 0.083% nebulizer solution Take 2.5 mg by nebulization every 6 (six) hours as needed for wheezing or shortness of breath.    [name] amLODipine (NORVASC) 10 MG tablet Take 1 tablet (10 mg total) by mouth daily. 90 tablet 3  . aspirin EC 81 MG tablet Take 81 mg by mouth daily.    . blood glucose meter kit and supplies Dispense based on patient and insurance preference. Use up to four times daily as directed. (FOR ICD-9 250.00, 250.01). 1 each 0  . Docusate Sodium (COLACE PO) Take by mouth.    . fexofenadine (ALLEGRA) 180 MG tablet Take 180 mg by mouth daily as needed. For seasonal allergies    . fluticasone (FLONASE) 50 MCG/ACT nasal spray Place 2 sprays into both nostrils daily. 16 g 6  . glucose blood test strip Used to check blood sugars up to four times daily as needed 200 each 3  . ibuprofen (ADVIL,MOTRIN) 800 MG tablet ibuprofen 800 mg tablet    . Lancets MISC Used to check blood sugars up to four times daily as needed 200 each 3  . losartan (COZAAR) 100 MG tablet TAKE 1 TABLET(100 MG) BY MOUTH DAILY 90 tablet 1  . olopatadine (PATANOL) 0.1 % ophthalmic solution Apply to eye.    . rosuvastatin (CRESTOR) 40 MG tablet Take 1 tablet (40 mg total) by mouth daily. 90 tablet 0  .  Semaglutide (RYBELSUS) 7 MG TABS Take 7 mg by mouth daily. 30 tablet 0  . ticagrelor (BRILINTA) 90 MG TABS tablet Take 0.5 tablets (45 mg total) by mouth 2 (two) times daily. 60 tablet 0   No current facility-administered medications on file prior to visit.     PAST MEDICAL HISTORY: Past Medical History:  Diagnosis Date  . Allergic rhinitis, cause unspecified   . Arthritis   . Asthma   . Diabetes mellitus type II 08/2009 dx  . Endometriosis   . Hyperlipemia   . Hypertension   . Knee pain, right    DJD, post traumatic with repeat falls  . Lactose intolerance   . Morbid obesity ([place])    s/p lab band 09/2010  . Osteoarthritis   . Ovarian cyst    right s/p resection June 2013  . Stroke [facility])     PAST SURGICAL HISTORY: Past Surgical History:  Procedure Laterality Date  . ABDOMINAL HYSTERECTOMY  05/14/12   [place] [facility]  . IR ANGIO INTRA EXTRACRAN SEL COM CAROTID INNOMINATE BILAT MOD SED  07/04/2017  . IR ANGIO VERTEBRAL SEL VERTEBRAL BILAT MOD SED  07/04/2017  . IR INTRA CRAN STENT  07/07/2017  . IR RADIOLOGIST EVAL & MGMT  08/13/2017  . LAPAROSCOPIC GASTRIC BANDING  10/02/2010   start weight 349#  . RADIOLOGY WITH ANESTHESIA N/A 07/07/2017   Procedure: STENTING;  Surgeon: Luanne Bras, MD;  Location: [facility];  Service: Radiology;  Laterality: N/A;  . SALPINGOOPHORECTOMY  10/08/11   right    SOCIAL HISTORY: Social History   Tobacco Use  . Smoking status: Never Smoker  . Smokeless tobacco: Never Used  Substance Use Topics  . Alcohol use: No    Alcohol/week: 0.0 standard drinks  . Drug use: No    FAMILY HISTORY: Family History  Problem Relation Age of Onset  . Heart disease Mother   . Hypertension Mother   . Diabetes Mother   . Stroke Father 64  . Diabetes Father   . Hypertension Father   . Hyperlipidemia Father   . Heart disease Father   . Kidney disease Father   . Cancer Father   . Liver disease Father   . Obesity Father     ROS: Review of  Systems  Constitutional: Negative for weight loss.  Cardiovascular: Negative for chest pain.       Negative chest pressure  Neurological: Negative for headaches.  Endo/Heme/Allergies:       Negative hypoglycemia    PHYSICAL EXAM: Blood pressure 109/73, pulse 69, temperature 97.9 F (36.6 C), temperature source Oral, height '5\' 7"'  (1.702 m), weight (!) 323 lb (146.5 kg), last menstrual period 07/28/2011, SpO2 98 %. Body mass index is 50.59 kg/m. Physical Exam Vitals signs reviewed.  Constitutional:      Appearance: Normal appearance. She is obese.  Cardiovascular:     Rate and Rhythm: Normal rate.     Pulses: Normal pulses.  Pulmonary:     Effort: Pulmonary effort is normal.     Breath sounds: Normal breath sounds.  Musculoskeletal: Normal range of motion.  Skin:    General: Skin is warm and dry.  Neurological:     Mental Status: She is alert and oriented to person, place, and time.  Psychiatric:        Mood and Affect: Mood normal.        Behavior: Behavior normal.     RECENT LABS AND TESTS: BMET    Component Value Date/Time   NA 142 07/01/2018 1412   K 3.7 07/01/2018 1412   CL 103 07/01/2018 1412   CO2 21 07/01/2018 1412   GLUCOSE 173 (H) 07/01/2018 1412   GLUCOSE 249 (H) 01/05/2018 1013   BUN 10 07/01/2018 1412   CREATININE 0.75 07/01/2018 1412   CALCIUM 9.2 07/01/2018 1412   GFRNONAA 97 07/01/2018 1412   GFRAA 112 07/01/2018 1412   Lab Results  Component Value Date   HGBA1C 7.9 (A) 10/09/2018   HGBA1C 9.7 (A) 07/09/2018   HGBA1C 9.2 (A) 04/09/2018   HGBA1C 11.1 (H) 01/05/2018   HGBA1C 11.2 (H) 10/02/2017   Lab Results  Component Value Date   INSULIN 8.9 07/01/2018   CBC    Component Value Date/Time   WBC 9.9 07/01/2018 1412   WBC 8.7 01/05/2018 1013   RBC 5.24 07/01/2018 1412   RBC 5.10 01/05/2018 1013   HGB 13.5 07/01/2018 1412   HCT 42.7 07/01/2018 1412   PLT 314.0 01/05/2018 1013   MCV 82 07/01/2018 1412   MCH 25.8 (L) 07/01/2018 1412    MCH  26.7 07/09/2017 0606   MCHC 31.6 07/01/2018 1412   MCHC 32.5 01/05/2018 1013   RDW 13.7 07/01/2018 1412   LYMPHSABS 2.2 07/01/2018 1412   MONOABS 0.3 07/08/2017 0607   EOSABS 0.3 07/01/2018 1412   BASOSABS 0.1 07/01/2018 1412   Iron/TIBC/Ferritin/ %Sat No results found for: IRON, TIBC, FERRITIN, IRONPCTSAT Lipid Panel     Component Value Date/Time   CHOL 90 10/09/2018 1328   CHOL 156 05/20/2018 1321   TRIG 55.0 10/09/2018 1328   HDL 42.10 10/09/2018 1328   HDL 56 05/20/2018 1321   CHOLHDL 2 10/09/2018 1328   VLDL 11.0 10/09/2018 1328   LDLCALC 37 10/09/2018 1328   LDLCALC 85 05/20/2018 1321   Hepatic Function Panel     Component Value Date/Time   PROT 6.7 07/01/2018 1412   ALBUMIN 4.2 07/01/2018 1412   AST 14 07/01/2018 1412   ALT 19 07/01/2018 1412   ALKPHOS 109 07/01/2018 1412   BILITOT 0.3 07/01/2018 1412   BILIDIR 0.1 04/18/2015 1140      Component Value Date/Time   TSH 1.620 07/01/2018 1412   TSH 2.54 04/18/2015 1140   TSH 1.66 07/22/2013 1215      OBESITY BEHAVIORAL INTERVENTION VISIT  Today's visit was # 8   Starting weight: 322 lbs Starting date: 07/01/2018 Today's weight : 323 lbs Today's date: 10/28/2018 Total lbs lost to date: 0    ASK: We discussed the diagnosis of obesity with Debra [name] today and Debra [name] agreed to give [name] permission to discuss obesity behavioral modification therapy today.  ASSESS: Keiandra has the diagnosis of obesity and her BMI today is 50.58 Yaileen is in the action stage of change   ADVISE: Rola was educated on the multiple health risks of obesity as well as the benefit of weight loss to improve her health. She was advised of the need for long term treatment and the importance of lifestyle modifications to improve her current health and to decrease her risk of future health problems.  AGREE: Multiple dietary modification options and treatment options were discussed and  Debra [name] agreed to follow the  recommendations documented in the above note.  ARRANGE: Debra [name] was educated on the importance of frequent visits to treat obesity as outlined per CMS and USPSTF guidelines and agreed to schedule her next follow up appointment today.  I, Trixie Dredge, am acting as transcriptionist for Ilene Qua, MD  I have reviewed the above documentation for accuracy and completeness, and I agree with the above. - Ilene Qua, MD

## 2018-11-10 ENCOUNTER — Telehealth: Payer: BC Managed Care – PPO | Admitting: Nurse Practitioner

## 2018-11-10 DIAGNOSIS — J301 Allergic rhinitis due to pollen: Secondary | ICD-10-CM | POA: Diagnosis not present

## 2018-11-10 MED ORDER — FLUTICASONE PROPIONATE 50 MCG/ACT NA SUSP: 2.0000 | Freq: Every day | NASAL | 6 refills | Status: DC

## 2018-11-10 NOTE — Progress Notes (Signed)
E visit for Allergic Rhinitis We are sorry that you are not feeling well.  Here is how we plan to help!  Based on what you have shared with me it looks like you have Allergic Rhinitis.  Rhinitis is when a reaction occurs that causes nasal congestion, runny nose, sneezing, and itching.  Most types of rhinitis are caused by an inflammation and are associated with symptoms in the eyes ears or throat. There are several types of rhinitis.  The most common are acute rhinitis, which is usually caused by a viral illness, allergic or seasonal rhinitis, and nonallergic or year-round rhinitis.  Nasal allergies occur certain times of the year.  Allergic rhinitis is caused when allergens in the air trigger the release of histamine in the body.  Histamine causes itching, swelling, and fluid to build up in the fragile linings of the nasal passages, sinuses and eyelids.  An itchy nose and clear discharge are common.  I recommend the following over the counter treatments: Allegra 60 mg twice daily  I also would recommend a nasal spray: prescription has been called in Flonase 2 sprays into each nostril once daily  You may also benefit from eye drops such as: Systane 1-2 driops each eye twice daily as needed  HOME CARE:   You can use an over-the-counter saline nasal spray as needed  Avoid areas where there is heavy dust, mites, or molds  Stay indoors on windy days during the pollen season  Keep windows closed in home, at least in bedroom; use air conditioner.  Use high-efficiency house air filter  Keep windows closed in car, turn AC on re-circulate  Avoid playing out with dog during pollen season  GET HELP RIGHT AWAY IF:   If your symptoms do not improve within 10 days  You become short of breath  You develop yellow or green discharge from your nose for over 3 days  You have coughing fits  MAKE SURE YOU:   Understand these instructions  Will watch your condition  Will get help right  away if you are not doing well or get worse  Thank you for choosing an e-visit. Your e-visit answers were reviewed by a board certified advanced clinical practitioner to complete your personal care plan. Depending upon the condition, your plan could have included both over the counter or prescription medications. Please review your pharmacy choice. Be sure that the pharmacy you have chosen is open so that you can pick up your prescription now.  If there is a problem you may message your provider in [place] to have the prescription routed to another pharmacy. Your safety is important to [name]. If you have drug allergies check your prescription carefully.  For the next 24 hours, you can use MyChart to ask questions about today's visit, request a non-urgent call back, or ask for a work or school excuse from your e-visit provider. You will get an email in the next two days asking about your experience. I hope that your e-visit has been valuable and will speed your recovery.    5-10 minutes spent reviewing and documenting in chart.

## 2018-11-11 ENCOUNTER — Ambulatory Visit (INDEPENDENT_AMBULATORY_CARE_PROVIDER_SITE_OTHER): Payer: BC Managed Care – PPO | Admitting: Family Medicine

## 2018-11-11 ENCOUNTER — Other Ambulatory Visit: Payer: Self-pay

## 2018-11-11 ENCOUNTER — Encounter (INDEPENDENT_AMBULATORY_CARE_PROVIDER_SITE_OTHER): Payer: Self-pay | Admitting: Family Medicine

## 2018-11-11 VITALS — BP 108/72 | HR 76 | Temp 98.2°F | Ht 67.0 in | Wt 325.0 lb

## 2018-11-11 DIAGNOSIS — Z6841 Body Mass Index (BMI) 40.0 and over, adult: Secondary | ICD-10-CM

## 2018-11-11 DIAGNOSIS — I10 Essential (primary) hypertension: Secondary | ICD-10-CM | POA: Diagnosis not present

## 2018-11-11 DIAGNOSIS — E119 Type 2 diabetes mellitus without complications: Secondary | ICD-10-CM | POA: Diagnosis not present

## 2018-11-11 DIAGNOSIS — Z9189 Other specified personal risk factors, not elsewhere classified: Secondary | ICD-10-CM | POA: Diagnosis not present

## 2018-11-11 MED ORDER — AMLODIPINE BESYLATE 2.5 MG PO TABS: 7.5000 mg | ORAL_TABLET | Freq: Every day | ORAL | 0 refills | Status: DC

## 2018-11-11 NOTE — Progress Notes (Signed)
Office: [phone]  /  Fax: [phone]   HPI:   Chief Complaint: OBESITY Debra [name] is here to discuss her progress with her obesity treatment plan. She is on the Category 3 plan or follow the Pescatarian eating plan + 300 calories and is following her eating plan approximately 80 % of the time. She states she is walking for 30-45 minutes 3 times per week. Debra [name] is getting more on track to stop skipping so many meals, and she has not skipped meals. She finds all the meat to be the hardest to get in. She has been trying to increase water consumption, but is limited secondary to lap band.  Her weight is (!) 325 lb (147.4 kg) today and has gained 2 lbs since her last visit. She has lost 0 lbs since starting treatment with [name].  Diabetes II with Hyperglycemia Debra [name] has a diagnosis of diabetes type II. Debra [name] states her fasting BGs range in 140's. She denies hypoglycemia. She denies GI side effects of Rybelsus and she is not on metformin. Last A1c was 7.9. She has been working on intensive lifestyle modifications including diet, exercise, and weight loss to help control her blood glucose levels.  Hypertension Debra [name] is a 43 y.o. female with hypertension. Debra [name]'s blood pressure is controlled, but it is low. She is working on weight loss to help control her blood pressure with the goal of decreasing her risk of heart attack and stroke.   At risk for cardiovascular disease Debra [name] is at a higher than average risk for cardiovascular disease due to obesity, diabetes II, and hypertension. She currently denies any chest pain.  ASSESSMENT AND PLAN:  Type 2 diabetes mellitus without complication, without long-term current use of insulin (HCC)  Essential hypertension  At risk for heart disease  Class 3 severe obesity with serious comorbidity and body mass index (BMI) of 50.0 to 59.9 in adult, unspecified obesity type ([place])  PLAN:  Diabetes II with Hyperglycemia Debra [name] has been  given extensive diabetes education by myself today including ideal fasting and post-prandial blood glucose readings, individual ideal Hgb A1c goals and hypoglycemia prevention. We discussed the importance of good blood sugar control to decrease the likelihood of diabetic complications such as nephropathy, neuropathy, limb loss, blindness, coronary artery disease, and death. We discussed the importance of intensive lifestyle modification including diet, exercise and weight loss as the first line treatment for diabetes. Debra [name] agrees to continue taking Rybelsus and she agrees to follow up with our clinic in 2 weeks.  Hypertension We discussed sodium restriction, working on healthy weight loss, and a regular exercise program as the means to achieve improved blood pressure control. Debra [name] agreed with this plan and agreed to follow up as directed. We will continue to monitor her blood pressure as well as her progress with the above lifestyle modifications. Debra [name] agrees to decrease amlodipine to 7.5 mg PO daily #90 with no refills. She will watch for signs of hypotension as she continues her lifestyle modifications. Debra [name] agrees to follow up with our clinic in 2 weeks.  Cardiovascular risk counseling Debra [name] was given extended (15 minutes) coronary artery disease prevention counseling today. She is 43 y.o. female and has risk factors for heart disease including obesity, diabetes II, and hypertension. We discussed intensive lifestyle modifications today with an emphasis on specific weight loss instructions and strategies. Pt was also informed of the importance of increasing exercise and decreasing saturated fats to help prevent heart disease.  Obesity Debra [name] is currently in the  action stage of change. As such, her goal is to continue with weight loss efforts She has agreed to follow the Category 3 plan and keep a food journal with 400-500 calories and 35+ grams of protein at lunch daily, or follow the  [employer] eating plan Debra [name] has been instructed to work up to a goal of 150 minutes of combined cardio and strengthening exercise per week for weight loss and overall health benefits. We discussed the following Behavioral Modification Strategies today: increasing lean protein intake, increasing vegetables and work on meal planning and easy cooking plans, keeping healthy foods in the home, and planning for success   Debra [name] has agreed to follow up with our clinic in 2 weeks. She was informed of the importance of frequent follow up visits to maximize her success with intensive lifestyle modifications for her multiple health conditions.  ALLERGIES: Allergies  Allergen Reactions  . Influenza Vaccines   . Metformin And Related Diarrhea  . Morphine Hives  . Augmentin [Amoxicillin-Pot Clavulanate] Rash    To bilat post hands only    MEDICATIONS: Current Outpatient Medications on File Prior to Visit  Medication Sig Dispense Refill  . albuterol (PROAIR HFA) 108 (90 BASE) MCG/ACT inhaler Inhale 2 puffs into the lungs every 4 (four) hours as needed for wheezing or shortness of breath. 18 g 2  . albuterol (PROVENTIL) (2.5 MG/3ML) 0.083% nebulizer solution Take 2.5 mg by nebulization every 6 (six) hours as needed for wheezing or shortness of breath.    [name] aspirin EC 81 MG tablet Take 81 mg by mouth daily.    . blood glucose meter kit and supplies Dispense based on patient and insurance preference. Use up to four times daily as directed. (FOR ICD-9 250.00, 250.01). 1 each 0  . Docusate Sodium (COLACE PO) Take by mouth.    . fexofenadine (ALLEGRA) 180 MG tablet Take 180 mg by mouth daily as needed. For seasonal allergies    . fluticasone (FLONASE) 50 MCG/ACT nasal spray Place 2 sprays into both nostrils daily. 16 g 6  . glucose blood test strip Used to check blood sugars up to four times daily as needed 200 each 3  . ibuprofen (ADVIL,MOTRIN) 800 MG tablet ibuprofen 800 mg tablet    . Lancets MISC  Used to check blood sugars up to four times daily as needed 200 each 3  . losartan (COZAAR) 100 MG tablet TAKE 1 TABLET(100 MG) BY MOUTH DAILY 90 tablet 1  . olopatadine (PATANOL) 0.1 % ophthalmic solution Apply to eye.    . rosuvastatin (CRESTOR) 40 MG tablet Take 1 tablet (40 mg total) by mouth daily. 90 tablet 0  . Semaglutide (RYBELSUS) 7 MG TABS Take 7 mg by mouth daily. 30 tablet 0  . ticagrelor (BRILINTA) 90 MG TABS tablet Take 0.5 tablets (45 mg total) by mouth 2 (two) times daily. 60 tablet 0   No current facility-administered medications on file prior to visit.     PAST MEDICAL HISTORY: Past Medical History:  Diagnosis Date  . Allergic rhinitis, cause unspecified   . Arthritis   . Asthma   . Diabetes mellitus type II 08/2009 dx  . Endometriosis   . Hyperlipemia   . Hypertension   . Knee pain, right    DJD, post traumatic with repeat falls  . Lactose intolerance   . Morbid obesity ([place])    s/p lab band 09/2010  . Osteoarthritis   . Ovarian cyst    right s/p resection June 2013  .  Stroke [facility])     PAST SURGICAL HISTORY: Past Surgical History:  Procedure Laterality Date  . ABDOMINAL HYSTERECTOMY  05/14/12   [place]  . IR ANGIO INTRA EXTRACRAN SEL COM CAROTID INNOMINATE BILAT MOD SED  07/04/2017  . IR ANGIO VERTEBRAL SEL VERTEBRAL BILAT MOD SED  07/04/2017  . IR INTRA CRAN STENT  07/07/2017  . IR RADIOLOGIST EVAL & MGMT  08/13/2017  . LAPAROSCOPIC GASTRIC BANDING  10/02/2010   start weight 349#  . RADIOLOGY WITH ANESTHESIA N/A 07/07/2017   Procedure: STENTING;  Surgeon: Luanne Bras, MD;  Location: [place];  Service: Radiology;  Laterality: N/A;  . SALPINGOOPHORECTOMY  10/08/11   right    SOCIAL HISTORY: Social History   Tobacco Use  . Smoking status: Never Smoker  . Smokeless tobacco: Never Used  Substance Use Topics  . Alcohol use: No    Alcohol/week: 0.0 standard drinks  . Drug use: No    FAMILY HISTORY: Family History  Problem Relation  Age of Onset  . Heart disease Mother   . Hypertension Mother   . Diabetes Mother   . Stroke Father 76  . Diabetes Father   . Hypertension Father   . Hyperlipidemia Father   . Heart disease Father   . Kidney disease Father   . Cancer Father   . Liver disease Father   . Obesity Father     ROS: Review of Systems  Constitutional: Negative for weight loss.  Cardiovascular: Negative for chest pain.  Endo/Heme/Allergies:       Negative hypoglycemia    PHYSICAL EXAM: Blood pressure 108/72, pulse 76, temperature 98.2 F (36.8 C), temperature source Oral, height 5' 7" (1.702 m), weight (!) 325 lb (147.4 kg), last menstrual period 07/28/2011, SpO2 97 %. Body mass index is 50.9 kg/m. Physical Exam Vitals signs reviewed.  Constitutional:      Appearance: Normal appearance. She is obese.  Cardiovascular:     Rate and Rhythm: Normal rate.     Pulses: Normal pulses.  Pulmonary:     Effort: Pulmonary effort is normal.     Breath sounds: Normal breath sounds.  Musculoskeletal: Normal range of motion.  Skin:    General: Skin is warm and dry.  Neurological:     Mental Status: She is alert and oriented to person, place, and time.  Psychiatric:        Mood and Affect: Mood normal.        Behavior: Behavior normal.     RECENT LABS AND TESTS: BMET    Component Value Date/Time   NA 142 07/01/2018 1412   K 3.7 07/01/2018 1412   CL 103 07/01/2018 1412   CO2 21 07/01/2018 1412   GLUCOSE 173 (H) 07/01/2018 1412   GLUCOSE 249 (H) 01/05/2018 1013   BUN 10 07/01/2018 1412   CREATININE 0.75 07/01/2018 1412   CALCIUM 9.2 07/01/2018 1412   GFRNONAA 97 07/01/2018 1412   GFRAA 112 07/01/2018 1412   Lab Results  Component Value Date   HGBA1C 7.9 (A) 10/09/2018   HGBA1C 9.7 (A) 07/09/2018   HGBA1C 9.2 (A) 04/09/2018   HGBA1C 11.1 (H) 01/05/2018   HGBA1C 11.2 (H) 10/02/2017   Lab Results  Component Value Date   INSULIN 8.9 07/01/2018   CBC    Component Value Date/Time   WBC  9.9 07/01/2018 1412   WBC 8.7 01/05/2018 1013   RBC 5.24 07/01/2018 1412   RBC 5.10 01/05/2018 1013   HGB 13.5 07/01/2018 1412  HCT 42.7 07/01/2018 1412   PLT 314.0 01/05/2018 1013   MCV 82 07/01/2018 1412   MCH 25.8 (L) 07/01/2018 1412   MCH 26.7 07/09/2017 0606   MCHC 31.6 07/01/2018 1412   MCHC 32.5 01/05/2018 1013   RDW 13.7 07/01/2018 1412   LYMPHSABS 2.2 07/01/2018 1412   MONOABS 0.3 07/08/2017 0607   EOSABS 0.3 07/01/2018 1412   BASOSABS 0.1 07/01/2018 1412   Iron/TIBC/Ferritin/ %Sat No results found for: IRON, TIBC, FERRITIN, IRONPCTSAT Lipid Panel     Component Value Date/Time   CHOL 90 10/09/2018 1328   CHOL 156 05/20/2018 1321   TRIG 55.0 10/09/2018 1328   HDL 42.10 10/09/2018 1328   HDL 56 05/20/2018 1321   CHOLHDL 2 10/09/2018 1328   VLDL 11.0 10/09/2018 1328   LDLCALC 37 10/09/2018 1328   LDLCALC 85 05/20/2018 1321   Hepatic Function Panel     Component Value Date/Time   PROT 6.7 07/01/2018 1412   ALBUMIN 4.2 07/01/2018 1412   AST 14 07/01/2018 1412   ALT 19 07/01/2018 1412   ALKPHOS 109 07/01/2018 1412   BILITOT 0.3 07/01/2018 1412   BILIDIR 0.1 04/18/2015 1140      Component Value Date/Time   TSH 1.620 07/01/2018 1412   TSH 2.54 04/18/2015 1140   TSH 1.66 07/22/2013 1215      OBESITY BEHAVIORAL INTERVENTION VISIT  Today's visit was # 10   Starting weight: 322 lbs Starting date: 07/01/2018 Today's weight : 325 lbs Today's date: 11/11/2018 Total lbs lost to date: 0    ASK: We discussed the diagnosis of obesity with Debra [name] today and Debra [name] agreed to give [name] permission to discuss obesity behavioral modification therapy today.  ASSESS: Debra [name] has the diagnosis of obesity and her BMI today is 50.89 Debra [name] is in the action stage of change   ADVISE: Addysyn was educated on the multiple health risks of obesity as well as the benefit of weight loss to improve her health. She was advised of the need for long term treatment and  the importance of lifestyle modifications to improve her current health and to decrease her risk of future health problems.  AGREE: Multiple dietary modification options and treatment options were discussed and  Debra [name] agreed to follow the recommendations documented in the above note.  ARRANGE: Debra [name] was educated on the importance of frequent visits to treat obesity as outlined per CMS and USPSTF guidelines and agreed to schedule her next follow up appointment today.  I, Trixie Dredge, am acting as transcriptionist for Ilene Qua, MD  I have reviewed the above documentation for accuracy and completeness, and I agree with the above. - Ilene Qua, MD

## 2018-11-19 ENCOUNTER — Ambulatory Visit: Payer: BC Managed Care – PPO | Admitting: Adult Health

## 2018-11-19 ENCOUNTER — Telehealth: Payer: Self-pay | Admitting: *Deleted

## 2018-11-19 NOTE — Telephone Encounter (Signed)
Spoke to pt, cancelled appt today due to provider ill.  Will cb to r/s.

## 2018-11-23 ENCOUNTER — Encounter: Payer: Self-pay | Admitting: Adult Health

## 2018-11-23 ENCOUNTER — Other Ambulatory Visit: Payer: Self-pay

## 2018-11-23 ENCOUNTER — Ambulatory Visit: Payer: BC Managed Care – PPO | Admitting: Adult Health

## 2018-11-23 VITALS — BP 133/84 | HR 66 | Temp 98.5°F | Ht 67.0 in | Wt 334.6 lb

## 2018-11-23 DIAGNOSIS — R5383 Other fatigue: Secondary | ICD-10-CM

## 2018-11-23 DIAGNOSIS — Z794 Long term (current) use of insulin: Secondary | ICD-10-CM

## 2018-11-23 DIAGNOSIS — I6603 Occlusion and stenosis of bilateral middle cerebral arteries: Secondary | ICD-10-CM

## 2018-11-23 DIAGNOSIS — G4700 Insomnia, unspecified: Secondary | ICD-10-CM | POA: Diagnosis not present

## 2018-11-23 DIAGNOSIS — I10 Essential (primary) hypertension: Secondary | ICD-10-CM

## 2018-11-23 DIAGNOSIS — I63512 Cerebral infarction due to unspecified occlusion or stenosis of left middle cerebral artery: Secondary | ICD-10-CM | POA: Diagnosis not present

## 2018-11-23 DIAGNOSIS — E1159 Type 2 diabetes mellitus with other circulatory complications: Secondary | ICD-10-CM

## 2018-11-23 DIAGNOSIS — E785 Hyperlipidemia, unspecified: Secondary | ICD-10-CM

## 2018-11-23 NOTE — Patient Instructions (Signed)
Continue aspirin 81 mg daily and Brilinta (ticagrelor) 90 mg bid  and Crestor 40 mg daily for secondary stroke prevention  Continue to follow up with PCP regarding cholesterol, blood pressure and diabetes management  - GREAT WORK on getting these levels under control!!!  Keep up the good work!!!  Referral placed to [place] [facility] for evaluation of potential sleep apnea  Follow-up with vascular surgery as recommended in September for repeat imaging and ongoing duration of Brilinta and aspirin  Continue to monitor blood pressure at home  Maintain strict control of hypertension with blood pressure goal below 130/90, diabetes with hemoglobin A1c goal below 6.5% and cholesterol with LDL cholesterol (bad cholesterol) goal below 70 mg/dL. I also advised the patient to eat a healthy diet with plenty of whole grains, cereals, fruits and vegetables, exercise regularly and maintain ideal body weight.  You have recovered well from a stroke standpoint and at this time, recommend follow-up as needed but to call office with questions or concerns regarding her stroke       Thank you for coming to see [name] at [facility]. I hope we have been able to provide you high quality care today.  You may receive a patient satisfaction survey over the next few weeks. We would appreciate your feedback and comments so that we may continue to improve ourselves and the health of our patients.

## 2018-11-23 NOTE — Telephone Encounter (Signed)
LMVM for pt to return call to r/s appt.  

## 2018-11-23 NOTE — Progress Notes (Signed)
STROKE NEUROLOGY FOLLOW UP NOTE  NAME: Debra [name] DOB: 02/04/1976  REASON FOR VISIT: stroke follow up HISTORY FROM: pt and chart  Chief Complaint  Patient presents with   Follow-up    6 mon f/u. Alone. Rm 9. No new concerns at this time.      History Summary [name] is a 43 y.o. female with history of HTN, type 2 DM, morbid obesity, endometriosis/ovarian cyst s/p hysterectomy/SBO and seasonal allergies admitted on 06/30/17 for slurred speech and expressive aphasia.  CT head showed small cortical hypoattenuation within the left parietal occipital junction possible acute infarct. CTA head and neck on admission multiple anterior and posterior stenosis, and MRI showed a cluster of acute infarct posterior left temporal lobe and temporoparietal junction region in the left MCA territory.  After admission, patient had fluctuating left MCA syndrome especially with positional changes and BP changes.  Repeat CT showed worsening stable left medial occipital lobe infarct.  Repeat CT head and neck showed unchanged bilateral high-grade M2 stenosis.  CT perfusion with small perfusion deficit, not amendable to intervention.  Repeat MRI showed patchy acute infarct in the left posterior MCA and watershed distributions.  Underwent cerebral angiogram on 07/04/2017, found to have left MCA inferior division >75% stenosis and right M1 80% stenosis, no vasculitis but atherosclerosis.  At end of procedure patient had a vasovagal event while holding pressure at the right groin.  BP dropped and she again had left MCA syndrome.  She was given IV bolus, put into Trendelenburg position and treated with pressors.  BP improved and symptoms resolved.  She was admitted in ICU after procedure, continues to have mild expressive aphasia but motor function at baseline.  LDL 129, and A1c 8.5.  UDS negative.  Hypercoagulable work-up negative.  She received left MCA stenting on 07/07/2017.  Her symptoms improved and was  discharged home on 07/08/2017 with home health PT/speech.  Home medication including aspirin, Brilinta and Lipitor 80.  08/18/17 visit JX: During the interval time, the patient has been doing well. Still has mild expressive aphasia but much improve. She still has HH speech and PT. She followed with Dr. Corliss Skainseveshwar on 08/14/17 and P2Y12 was low. So her brilinta was decreased in half.  Next visit in 3 months. Continued on ASA 81 and lipitor. She stated that her BP and glucose controlled well at home. Today BP 135/79.   11/17/17 UPDATE: Patient is being seen today for follow-up and is accompanied by her mother.  She states her speech continues to improve and only has occasional episodes of difficulty getting certain words out with possible stuttering but this occurs more frequently when she is tired, upset or excited.  She does continue to do home speech exercises.  She also has intermittent right arm tingling but this also has become less frequent.  She continues to take both aspirin and Brilinta with bruising but no bleeding.  Continues to take Lipitor but does endorse hip pain that has been new since starting Lipitor but states she did not realize this could be a possible side effect.  Cholesterol levels have not been rechecked since hospital admission.  Blood pressure satisfactory at 143/89.  Patient does continue to monitor this at home.  She also continues to check glucose levels which she states has been improving since her recent adjustment by PCP.  Patient continues to stay active with her church community and attempts to maintain a healthy diet.  She will be having repeat CTA  head/neck that was ordered by Dr. Corliss Skainseveshwar 11/19/2017 for follow-up of stent placement and assessment of right stenosis.  Patient denies new or worsening stroke/TIA symptoms.  05/20/2018 update: [name] is being seen today for scheduled follow-up visit.  She has been stable from a stroke standpoint with only intermittent hesitant speech  only during anxiety provoking situations but otherwise denies any other difficulty.  She did have follow-up appointment Dr. Corliss Skainsdeveshwar with repeat imaging on 11/19/2017 which showed progression of severe distal right M1 stenosis from prior imaging but did show left M1 and inferior division M2 stent widely patent and she has continued on aspirin and Brilinta with side effects of easy bruising but denies bleeding.  She plans on returning for repeat imaging on 05/27/18. Lipid panel obtained after prior appointment with LDL 47 therefore atorvastatin dose decreased from 80 mg to 40 mg due to reported statin side effects.  She does endorse improvement of her myalgias and continues on atorvastatin 40mg .  Blood pressure today satisfactory at 130/87.  Continues to monitor glucose levels with recent A1c showing improvement from prior A1c's at 9.2.  Denies new or worsening stroke/TIA symptoms.   11/23/2018 update: [name] is a 43 year old female who is being seen today for stroke follow-up.  Residual deficits of intermittent speech hesitancy which is worsened with anxiety but overall recovered well from a stroke standpoint.  She did have follow-up visit with vascular surgery on 06/26/2018 with left MCA stent widely patent and right MCA stenosis stable.  Recommended continuation of aspirin/Brilinta and follow-up CTA in 6 months time.  She denies bleeding and only mild bruising with ongoing use of aspirin and Brilinta.  Continues on Crestor 40 mg daily without myalgias.  Recent lipid panel by PCP on 10/09/2018 showed LDL 37.  Blood pressure stable at 133/84.  Recent A1c 7.9 on 10/09/2018.  She continues to follow a healthy weight and wellness who has been helping decrease medications and improvement of stroke factors.  Per patient, once HTN, HLD and DM are stable, they will start to address weight loss.  She does endorse insomnia and excessive daytime fatigue.  Denies snoring or morning headaches.  No further concerns at this  time.    REVIEW OF SYSTEMS: Full 14 system review of systems performed and notable only for those listed below and in HPI above, all others are negative:  Insomnia, fatigue  The following represents the patient's updated allergies and side effects list: Allergies  Allergen Reactions   Influenza Vaccines    Metformin And Related Diarrhea   Morphine Hives   Augmentin [Amoxicillin-Pot Clavulanate] Rash    To bilat post hands only    The neurologically relevant items on the patient's problem list were reviewed on today's visit.  Neurologic Examination  A problem focused neurological exam (12 or more points of the single system neurologic examination, vital signs counts as 1 point, cranial nerves count for 8 points) was performed.  Blood pressure 133/84, pulse 66, temperature 98.5 F (36.9 C), temperature source Oral, height 5\' 7"  (1.702 m), weight (!) 334 lb 9.6 oz (151.8 kg), last menstrual period 07/28/2011.  General - Morbid obesity, well developed, pleasant middle-aged African-American female, in no apparent distress.  Ophthalmologic - Sharp disc margins OU.   Cardiovascular - Regular rate and rhythm.  Mental Status -  Level of arousal and orientation to time, place, and person were intact. Language including naming, repetition, comprehension was assessed and found intact.  Unable to appreciate any language or speech difficulties  during appointment Attention span and concentration were normal. Fund of Knowledge was assessed and was intact.  Cranial Nerves II - XII - II - Visual field intact OU. III, IV, VI - Extraocular movements intact. V - Facial sensation intact bilaterally. VII - Facial movement intact bilaterally. VIII - Hearing & vestibular intact bilaterally. X - Palate elevates symmetrically. XI - Chin turning & shoulder shrug intact bilaterally. XII - Tongue protrusion intact.  Motor Strength - The patients strength was normal in all extremities and  pronator drift was absent.  Bulk was normal and fasciculations were absent   Motor Tone - Muscle tone was assessed at the neck and appendages and was normal.  Reflexes - The patients reflexes were 1+ in all extremities and she had no pathological reflexes.  Sensory - Light touch, temperature/pinprick, vibration and proprioception, and Romberg testing were assessed and were normal.    Coordination - The patient had normal movements in the hands and feet with no ataxia or dysmetria.  Tremor was absent.  Gait and Station - The patient's transfers, posture, gait, station, and turns were observed as normal.    Data reviewed: I personally reviewed the images and agree with the radiology interpretations.  CT ANGIO HEAD W OR WO CONTRAST CT ANGIO NECK W OR WO CONTRAST 11/19/2017 IMPRESSION: CT head:  1. No acute intracranial process or abnormal enhancement of the brain identified. 2. Chronic left parietal region infarction.  CTA neck:  Patent carotid and vertebral arteries. No dissection, aneurysm, or hemodynamically significant stenosis utilizing NASCET criteria.  CTA head:  1. Progression of severe distal right M1 stenosis from 03/19. 2. Left M1 and inferior division M2 stent is widely patent. 3. Stable mild-to-moderate bilateral proximal PCA stenosis. 4. No new large vessel occlusion, new segment of high-grade stenosis, aneurysm, or vascular malformation.      Assessment: Ms Latina Frank ia a 43 year old female with left MCA territory infarct with left MCA angioplasty and stent placement on 06/30/17 secondary to large vessel disease. Vascular risk factors include HTN, HLD and DM.  Residual deficits of intermittent aphasia with anxiety but overall recovered well from a stroke standpoint.   Plan:  - continue ASA 81mg  and brilinta 45mg  bid for stroke prevention and post stent  - continue to follow up with Dr. Estanislado Pandy with repeat imaging around September 2020 as  recommended and instructions for ongoing duration of aspirin and Brilinta - continue Crestor 40 mg daily for HDL and secondary stroke prevention -Continue to follow with healthy weight and wellness -Referral placed to [facility] for evaluation of potential apnea with morbid obesity, history of stroke, insomnia and daytime fatigue - check BP and glucose and record -Advised to continue to stay active and maintain a healthy diet - Follow up with your primary care physician for stroke risk factor modification. Recommend maintain blood pressure goal <130/80, diabetes with hemoglobin A1c goal below 7.0% and lipids with LDL cholesterol goal below 70 mg/dL.   Stable from stroke standpoint recommend follow-up as needed  I spent more than 25 minutes of face to face time with the patient. Greater than 50% of time was spent in counseling and coordination of care. We discussed follow up with IR, continue medication as instructed, undergoing sleep evaluation for potential OSA and increased risk factors of untreated OSA, check BP and glucose at home  Venancio Poisson, [facility]  [facility] [address] [place], [place] [address]  Phone [phone] Fax [phone] Note: This document  was prepared with digital dictation and possible smart phrase technology. Any transcriptional errors that result from this process are unintentional.

## 2018-11-30 NOTE — Progress Notes (Signed)
I agree with the above plan 

## 2018-12-02 ENCOUNTER — Encounter (INDEPENDENT_AMBULATORY_CARE_PROVIDER_SITE_OTHER): Payer: Self-pay | Admitting: Family Medicine

## 2018-12-02 ENCOUNTER — Ambulatory Visit (INDEPENDENT_AMBULATORY_CARE_PROVIDER_SITE_OTHER): Payer: BC Managed Care – PPO | Admitting: Family Medicine

## 2018-12-02 ENCOUNTER — Other Ambulatory Visit: Payer: Self-pay

## 2018-12-02 VITALS — BP 125/80 | HR 71 | Temp 98.0°F | Ht 67.0 in | Wt 329.0 lb

## 2018-12-02 DIAGNOSIS — E1165 Type 2 diabetes mellitus with hyperglycemia: Secondary | ICD-10-CM | POA: Diagnosis not present

## 2018-12-02 DIAGNOSIS — Z9189 Other specified personal risk factors, not elsewhere classified: Secondary | ICD-10-CM

## 2018-12-02 DIAGNOSIS — I10 Essential (primary) hypertension: Secondary | ICD-10-CM | POA: Diagnosis not present

## 2018-12-02 DIAGNOSIS — Z6841 Body Mass Index (BMI) 40.0 and over, adult: Secondary | ICD-10-CM

## 2018-12-02 MED ORDER — RYBELSUS 14 MG PO TABS: 14.0000 mg | ORAL_TABLET | Freq: Every day | ORAL | 0 refills | Status: DC

## 2018-12-03 ENCOUNTER — Telehealth: Payer: Self-pay | Admitting: Internal Medicine

## 2018-12-03 NOTE — Telephone Encounter (Signed)
Patient is calling because she poked herself in the eye. And is red. Patient is wanting to schedule and appt. CB- [phone]

## 2018-12-04 ENCOUNTER — Encounter: Payer: Self-pay | Admitting: Internal Medicine

## 2018-12-04 ENCOUNTER — Other Ambulatory Visit: Payer: Self-pay

## 2018-12-04 ENCOUNTER — Ambulatory Visit (INDEPENDENT_AMBULATORY_CARE_PROVIDER_SITE_OTHER): Payer: BC Managed Care – PPO | Admitting: Internal Medicine

## 2018-12-04 VITALS — BP 126/80 | HR 64 | Temp 98.6°F | Ht 67.0 in | Wt 332.0 lb

## 2018-12-04 DIAGNOSIS — H5711 Ocular pain, right eye: Secondary | ICD-10-CM | POA: Diagnosis not present

## 2018-12-04 NOTE — Progress Notes (Signed)
   Subjective:   Patient ID: Debra [name], female    DOB: 1975/11/04, 43 y.o.   MRN: [id number]  HPI The patient is a 43 YO female coming in for injury to eye. Was stepping into the car on Wednesday and a straw from her drink hit her right eye. She did have immediate pain and denies any foreign body sensation. Denies change in vision or double vision or vision loss. She has past stroke and has some problems with peripheral vision on that side which is why she did not see straw coming. Some redness in the eye afterwards and it does feel irritated. Mild swelling right eyelid. Overall stable to mildly less red today.  Review of Systems  Constitutional: Negative.   HENT: Negative.   Eyes: Positive for pain and redness.  Respiratory: Negative for cough, chest tightness and shortness of breath.   Cardiovascular: Negative for chest pain, palpitations and leg swelling.  Gastrointestinal: Negative for abdominal distention, abdominal pain, constipation, diarrhea, nausea and vomiting.  Musculoskeletal: Negative.   Skin: Negative.   Neurological: Negative.   Psychiatric/Behavioral: Negative.     Objective:  Physical Exam Constitutional:      Appearance: She is well-developed. She is obese.  HENT:     Head: Normocephalic and atraumatic.  Eyes:     Extraocular Movements: Extraocular movements intact.     Pupils: Pupils are equal, round, and reactive to light.     Comments: Redness right eye without signs of infection or tear in eye. No periorbital edema or cellulitis  Neck:     Musculoskeletal: Normal range of motion.  Cardiovascular:     Rate and Rhythm: Normal rate and regular rhythm.  Pulmonary:     Effort: Pulmonary effort is normal. No respiratory distress.     Breath sounds: Normal breath sounds. No wheezing or rales.  Abdominal:     General: Bowel sounds are normal. There is no distension.     Palpations: Abdomen is soft.     Tenderness: There is no abdominal tenderness. There is no  rebound.  Skin:    General: Skin is warm and dry.  Neurological:     Mental Status: She is alert and oriented to person, place, and time.     Coordination: Coordination normal.     Vitals:   12/04/18 1342  BP: 126/80  Pulse: 64  Temp: 98.6 F (37 C)  TempSrc: Oral  SpO2: 98%  Weight: (!) 332 lb (150.6 kg)  Height: 5\' 7"  (1.702 m)    Assessment & Plan:

## 2018-12-04 NOTE — Patient Instructions (Addendum)
You can use saline eye drops or lubricant eye drops to help with irritation.    Rehab Ask your health care provider which exercises are safe for you. Do exercises exactly as told by your health care provider and adjust them as directed. It is normal to feel mild stretching, pulling, tightness, or discomfort as you do these exercises. Stop right away if you feel sudden pain or your pain gets worse. Do not begin these exercises until told by your health care provider. Stretching and range-of-motion exercises These exercises warm up your muscles and joints and improve the movement and flexibility of your foot. These exercises also help to relieve pain. Plantar fascia stretch  1. Sit with your left / right leg crossed over your opposite knee. 2. Hold your heel with one hand with that thumb near your arch. With your other hand, hold your toes and gently pull them back toward the top of your foot. You should feel a stretch on the bottom of your toes or your foot (plantar fascia) or both. 3. Hold this stretch for__________ seconds. 4. Slowly release your toes and return to the starting position. Repeat __________ times. Complete this exercise __________ times a day. Gastrocnemius stretch, standing This exercise is also called a calf (gastroc) stretch. It stretches the muscles in the back of the upper calf. 1. Stand with your hands against a wall. 2. Extend your left / right leg behind you, and bend your front knee slightly. 3. Keeping your heels on the floor and your back knee straight, shift your weight toward the wall. Do not arch your back. You should feel a gentle stretch in your upper left / right calf. 4. Hold this position for __________ seconds. Repeat __________ times. Complete this exercise __________ times a day. Soleus stretch, standing This exercise is also called a calf (soleus) stretch. It stretches the muscles in the back of the lower calf. 1. Stand with your hands against a  wall. 2. Extend your left / right leg behind you, and bend your front knee slightly. 3. Keeping your heels on the floor, bend your back knee and shift your weight slightly over your back leg. You should feel a gentle stretch deep in your lower calf. 4. Hold this position for __________ seconds. Repeat __________ times. Complete this exercise __________ times a day. Gastroc and soleus stretch, standing step This exercise stretches the muscles in the back of the lower leg. These muscles are in the upper calf (gastrocnemius) and the lower calf (soleus). 1. Stand with the ball of your left / right foot on a step. The ball of your foot is on the walking surface, right under your toes. 2. Keep your other foot firmly on the same step. 3. Hold on to the wall or a railing for balance. 4. Slowly lift your other foot, allowing your body weight to press your left / right heel down over the edge of the step. You should feel a stretch in your left / right calf. 5. Hold this position for __________ seconds. 6. Return both feet to the step. 7. Repeat this exercise with a slight bend in your left / right knee. Repeat __________ times with your left / right knee straight and __________ times with your left / right knee bent. Complete this exercise __________ times a day. Balance exercise This exercise builds your balance and strength control of your arch to help take pressure off your plantar fascia. Single leg stand If this exercise is too easy,  you can try it with your eyes closed or while standing on a pillow. 1. Without shoes, stand near a railing or in a doorway. You may hold on to the railing or door frame as needed. 2. Stand on your left / right foot. Keep your big toe down on the floor and try to keep your arch lifted. Do not let your foot roll inward. 3. Hold this position for __________ seconds. Repeat __________ times. Complete this exercise __________ times a day. This information is not intended to  replace advice given to you by your health care provider. Make sure you discuss any questions you have with your health care provider. Document Released: 04/08/2005 Document Revised: 07/30/2018 Document Reviewed: 02/04/2018 Elsevier Patient Education  2020 [employer]

## 2018-12-04 NOTE — Telephone Encounter (Signed)
After speaking to patient she informed me she has an eye doctor, she is going to call them for an appointment

## 2018-12-04 NOTE — Assessment & Plan Note (Signed)
Appears without injury. Advised saline drops and warm compress.

## 2018-12-04 NOTE — Telephone Encounter (Signed)
LVM for patient to call back. ?

## 2018-12-07 NOTE — Progress Notes (Signed)
 Office: [phone]  /  Fax: [phone]   HPI:   Chief Complaint: OBESITY Debra [name] is here to discuss her progress with her obesity treatment plan. She is on the keep a food journal with 400-500 calories and 35+ grams of protein at lunch daily and follow the Category 3 plan and is following her eating plan approximately 75-80 % of the time. She states she is walking 1 mile 7 times per week. Debra [name] reports she has been trying to do more fruit and Yasso bars for snack. She reports she and her friend is trying to walk a mile a day.  Her weight is (!) 329 lb (149.2 kg) today and has gained 4 lbs since her last visit. She has lost 0 lbs since starting treatment with us.  Diabetes II with Hyperglycemia Debra [name] has a diagnosis of diabetes type II. Debra [name] states her fasting BGs range between 140 and 150's. She denies hypoglycemia. She is on Rybelsus 7 mg. Last Hgb A1c was 7.9. She has been working on intensive lifestyle modifications including diet, exercise, and weight loss to help control her blood glucose levels.  Hypertension Debra [name] is a 43 y.o. female with hypertension. Debra [name]'s blood pressure is controlled. She denies chest pain, chest pressure, or headaches. She is working on weight loss to help control her blood pressure with the goal of decreasing her risk of heart attack and stroke.   At risk for cardiovascular disease Debra [name] is at a higher than average risk for cardiovascular disease due to obesity, diabetes II, and hypertension. She currently denies any chest pain.  ASSESSMENT AND PLAN:  Type 2 diabetes mellitus with hyperglycemia, without long-term current use of insulin (HCC) - Plan: Semaglutide (RYBELSUS) 14 MG TABS  Essential hypertension  At risk for heart disease  Class 3 severe obesity with serious comorbidity and body mass index (BMI) of 50.0 to 59.9 in adult, unspecified obesity type (HCC)  PLAN:  Diabetes II with Hyperglycemia Debra [name] has been given  extensive diabetes education by myself today including ideal fasting and post-prandial blood glucose readings, individual ideal Hgb A1c goals and hypoglycemia prevention. We discussed the importance of good blood sugar control to decrease the likelihood of diabetic complications such as nephropathy, neuropathy, limb loss, blindness, coronary artery disease, and death. We discussed the importance of intensive lifestyle modification including diet, exercise and weight loss as the first line treatment for diabetes. Debra [name] agrees to increase Rybelsus to 14 mg PO daily #30, with no refills. Debra [name] agrees to follow up with our clinic in 2 weeks.  Hypertension We discussed sodium restriction, working on healthy weight loss, and a regular exercise program as the means to achieve improved blood pressure control. Debra [name] agreed with this plan and agreed to follow up as directed. We will continue to monitor her blood pressure as well as her progress with the above lifestyle modifications. She will continue her medications and will watch for signs of hypotension as she continues her lifestyle modifications. We will follow up on her blood pressure at her next appointment. Debra [name] agrees to follow up with our clinic in 2 weeks.  Cardiovascular risk counseling Debra [name] was given extended (15 minutes) coronary artery disease prevention counseling today. She is 43 y.o. female and has risk factors for heart disease including obesity, diabetes II, and hypertension. We discussed intensive lifestyle modifications today with an emphasis on specific weight loss instructions and strategies. Pt was also informed of the importance of increasing exercise and decreasing saturated fats to help prevent   heart disease.  Obesity Debra [name] is currently in the action stage of change. As such, her goal is to continue with weight loss efforts She has agreed to keep a food journal with 1450-1600 calories and 100+ grams of protein daily or  follow the Pescatarian eating plan + 300 calories Debra [name] has been instructed to work up to a goal of 150 minutes of combined cardio and strengthening exercise per week for weight loss and overall health benefits. We discussed the following Behavioral Modification Strategies today: increasing lean protein intake, increasing vegetables and work on meal planning and easy cooking plans, keeping healthy foods in the home, and planning for success   Debra [name] has agreed to follow up with our clinic in 2 weeks. She was informed of the importance of frequent follow up visits to maximize her success with intensive lifestyle modifications for her multiple health conditions.  ALLERGIES: Allergies  Allergen Reactions  . Influenza Vaccines   . Metformin And Related Diarrhea  . Morphine Hives  . Augmentin [Amoxicillin-Pot Clavulanate] Rash    To bilat post hands only    MEDICATIONS: Current Outpatient Medications on File Prior to Visit  Medication Sig Dispense Refill  . albuterol (PROAIR HFA) 108 (90 BASE) MCG/ACT inhaler Inhale 2 puffs into the lungs every 4 (four) hours as needed for wheezing or shortness of breath. 18 g 2  . albuterol (PROVENTIL) (2.5 MG/3ML) 0.083% nebulizer solution Take 2.5 mg by nebulization every 6 (six) hours as needed for wheezing or shortness of breath.    . aspirin EC 81 MG tablet Take 81 mg by mouth daily.    . blood glucose meter kit and supplies Dispense based on patient and insurance preference. Use up to four times daily as directed. (FOR ICD-9 250.00, 250.01). 1 each 0  . Docusate Sodium (COLACE PO) Take by mouth.    . fexofenadine (ALLEGRA) 180 MG tablet Take 180 mg by mouth daily as needed. For seasonal allergies    . fluticasone (FLONASE) 50 MCG/ACT nasal spray Place 2 sprays into both nostrils daily. 16 g 6  . glucose blood test strip Used to check blood sugars up to four times daily as needed 200 each 3  . ibuprofen (ADVIL,MOTRIN) 800 MG tablet ibuprofen 800 mg  tablet    . Lancets MISC Used to check blood sugars up to four times daily as needed 200 each 3  . losartan (COZAAR) 100 MG tablet TAKE 1 TABLET(100 MG) BY MOUTH DAILY 90 tablet 1  . olopatadine (PATANOL) 0.1 % ophthalmic solution Apply to eye.    . rosuvastatin (CRESTOR) 40 MG tablet Take 1 tablet (40 mg total) by mouth daily. 90 tablet 0  . ticagrelor (BRILINTA) 90 MG TABS tablet Take 0.5 tablets (45 mg total) by mouth 2 (two) times daily. 60 tablet 0  . amLODipine (NORVASC) 2.5 MG tablet Take 3 tablets (7.5 mg total) by mouth daily. 90 tablet 0   No current facility-administered medications on file prior to visit.     PAST MEDICAL HISTORY: Past Medical History:  Diagnosis Date  . Allergic rhinitis, cause unspecified   . Arthritis   . Asthma   . Diabetes mellitus type II 08/2009 dx  . Endometriosis   . Hyperlipemia   . Hypertension   . Knee pain, right    DJD, post traumatic with repeat falls  . Lactose intolerance   . Morbid obesity (HCC)    s/p lab band 09/2010  . Osteoarthritis   . Ovarian cyst      right s/p resection June 2013  . Stroke [facility])     PAST SURGICAL HISTORY: Past Surgical History:  Procedure Laterality Date  . ABDOMINAL HYSTERECTOMY  05/14/12   [place]  . IR ANGIO INTRA EXTRACRAN SEL COM CAROTID INNOMINATE BILAT MOD SED  07/04/2017  . IR ANGIO VERTEBRAL SEL VERTEBRAL BILAT MOD SED  07/04/2017  . IR INTRA CRAN STENT  07/07/2017  . IR RADIOLOGIST EVAL & MGMT  08/13/2017  . LAPAROSCOPIC GASTRIC BANDING  10/02/2010   start weight 349#  . RADIOLOGY WITH ANESTHESIA N/A 07/07/2017   Procedure: STENTING;  Surgeon: Luanne Bras, MD;  Location: [place];  Service: Radiology;  Laterality: N/A;  . SALPINGOOPHORECTOMY  10/08/11   right    SOCIAL HISTORY: Social History   Tobacco Use  . Smoking status: Never Smoker  . Smokeless tobacco: Never Used  Substance Use Topics  . Alcohol use: No    Alcohol/week: 0.0 standard drinks  . Drug use: No     FAMILY HISTORY: Family History  Problem Relation Age of Onset  . Heart disease Mother   . Hypertension Mother   . Diabetes Mother   . Stroke Father 26  . Diabetes Father   . Hypertension Father   . Hyperlipidemia Father   . Heart disease Father   . Kidney disease Father   . Cancer Father   . Liver disease Father   . Obesity Father     ROS: Review of Systems  Constitutional: Negative for weight loss.  Cardiovascular: Negative for chest pain.       Negative chest pressure  Neurological: Negative for headaches.  Endo/Heme/Allergies:       Negative hypoglycemia    PHYSICAL EXAM: Blood pressure 125/80, pulse 71, temperature 98 F (36.7 C), temperature source Oral, height 5' 7" (1.702 m), weight (!) 329 lb (149.2 kg), last menstrual period 07/28/2011, SpO2 97 %. Body mass index is 51.53 kg/m. Physical Exam Vitals signs reviewed.  Constitutional:      Appearance: Normal appearance. She is obese.  Cardiovascular:     Rate and Rhythm: Normal rate.     Pulses: Normal pulses.  Pulmonary:     Effort: Pulmonary effort is normal.     Breath sounds: Normal breath sounds.  Musculoskeletal: Normal range of motion.  Skin:    General: Skin is warm and dry.  Neurological:     Mental Status: She is alert and oriented to person, place, and time.  Psychiatric:        Mood and Affect: Mood normal.        Behavior: Behavior normal.     RECENT LABS AND TESTS: BMET    Component Value Date/Time   NA 142 07/01/2018 1412   K 3.7 07/01/2018 1412   CL 103 07/01/2018 1412   CO2 21 07/01/2018 1412   GLUCOSE 173 (H) 07/01/2018 1412   GLUCOSE 249 (H) 01/05/2018 1013   BUN 10 07/01/2018 1412   CREATININE 0.75 07/01/2018 1412   CALCIUM 9.2 07/01/2018 1412   GFRNONAA 97 07/01/2018 1412   GFRAA 112 07/01/2018 1412   Lab Results  Component Value Date   HGBA1C 7.9 (A) 10/09/2018   HGBA1C 9.7 (A) 07/09/2018   HGBA1C 9.2 (A) 04/09/2018   HGBA1C 11.1 (H) 01/05/2018   HGBA1C 11.2 (H)  10/02/2017   Lab Results  Component Value Date   INSULIN 8.9 07/01/2018   CBC    Component Value Date/Time   WBC 9.9 07/01/2018 1412   WBC 8.7  01/05/2018 1013   RBC 5.24 07/01/2018 1412   RBC 5.10 01/05/2018 1013   HGB 13.5 07/01/2018 1412   HCT 42.7 07/01/2018 1412   PLT 314.0 01/05/2018 1013   MCV 82 07/01/2018 1412   MCH 25.8 (L) 07/01/2018 1412   MCH 26.7 07/09/2017 0606   MCHC 31.6 07/01/2018 1412   MCHC 32.5 01/05/2018 1013   RDW 13.7 07/01/2018 1412   LYMPHSABS 2.2 07/01/2018 1412   MONOABS 0.3 07/08/2017 0607   EOSABS 0.3 07/01/2018 1412   BASOSABS 0.1 07/01/2018 1412   Iron/TIBC/Ferritin/ %Sat No results found for: IRON, TIBC, FERRITIN, IRONPCTSAT Lipid Panel     Component Value Date/Time   CHOL 90 10/09/2018 1328   CHOL 156 05/20/2018 1321   TRIG 55.0 10/09/2018 1328   HDL 42.10 10/09/2018 1328   HDL 56 05/20/2018 1321   CHOLHDL 2 10/09/2018 1328   VLDL 11.0 10/09/2018 1328   LDLCALC 37 10/09/2018 1328   LDLCALC 85 05/20/2018 1321   Hepatic Function Panel     Component Value Date/Time   PROT 6.7 07/01/2018 1412   ALBUMIN 4.2 07/01/2018 1412   AST 14 07/01/2018 1412   ALT 19 07/01/2018 1412   ALKPHOS 109 07/01/2018 1412   BILITOT 0.3 07/01/2018 1412   BILIDIR 0.1 04/18/2015 1140      Component Value Date/Time   TSH 1.620 07/01/2018 1412   TSH 2.54 04/18/2015 1140   TSH 1.66 07/22/2013 1215      OBESITY BEHAVIORAL INTERVENTION VISIT  Today's visit was # 11   Starting weight: 322 lbs Starting date: 07/01/2018 Today's weight : 329 lbs Today's date: 12/02/2018 Total lbs lost to date: 0    ASK: We discussed the diagnosis of obesity with Debra [name] today and Debra [name] agreed to give us permission to discuss obesity behavioral modification therapy today.  ASSESS: Debra [name] has the diagnosis of obesity and her BMI today is 51.52 Debra [name] is in the action stage of change   ADVISE: Debra [name] was educated on the multiple health risks of  obesity as well as the benefit of weight loss to improve her health. She was advised of the need for long term treatment and the importance of lifestyle modifications to improve her current health and to decrease her risk of future health problems.  AGREE: Multiple dietary modification options and treatment options were discussed and  Debra [name] agreed to follow the recommendations documented in the above note.  ARRANGE: Debra [name] was educated on the importance of frequent visits to treat obesity as outlined per CMS and USPSTF guidelines and agreed to schedule her next follow up appointment today.  I, Sharon Martin, am acting as transcriptionist for Alexandria Kadolph, MD  I have reviewed the above documentation for accuracy and completeness, and I agree with the above. - Alexandria Kadolph, MD  

## 2018-12-10 ENCOUNTER — Ambulatory Visit: Payer: BC Managed Care – PPO | Admitting: Neurology

## 2018-12-10 ENCOUNTER — Other Ambulatory Visit: Payer: Self-pay

## 2018-12-10 ENCOUNTER — Other Ambulatory Visit (INDEPENDENT_AMBULATORY_CARE_PROVIDER_SITE_OTHER): Payer: Self-pay | Admitting: Family Medicine

## 2018-12-10 ENCOUNTER — Encounter: Payer: Self-pay | Admitting: Neurology

## 2018-12-10 VITALS — BP 141/94 | HR 68 | Ht 68.0 in | Wt 331.0 lb

## 2018-12-10 DIAGNOSIS — J4521 Mild intermittent asthma with (acute) exacerbation: Secondary | ICD-10-CM | POA: Diagnosis not present

## 2018-12-10 DIAGNOSIS — E1165 Type 2 diabetes mellitus with hyperglycemia: Secondary | ICD-10-CM | POA: Diagnosis not present

## 2018-12-10 DIAGNOSIS — F5104 Psychophysiologic insomnia: Secondary | ICD-10-CM

## 2018-12-10 DIAGNOSIS — R0683 Snoring: Secondary | ICD-10-CM | POA: Diagnosis not present

## 2018-12-10 DIAGNOSIS — N951 Menopausal and female climacteric states: Secondary | ICD-10-CM

## 2018-12-10 DIAGNOSIS — I10 Essential (primary) hypertension: Secondary | ICD-10-CM

## 2018-12-10 DIAGNOSIS — I63512 Cerebral infarction due to unspecified occlusion or stenosis of left middle cerebral artery: Secondary | ICD-10-CM

## 2018-12-10 DIAGNOSIS — Z8673 Personal history of transient ischemic attack (TIA), and cerebral infarction without residual deficits: Secondary | ICD-10-CM

## 2018-12-10 NOTE — Patient Instructions (Signed)
  Please remember to try to maintain good sleep hygiene, which means: Keep a regular sleep and wake schedule, try not to exercise or have a meal within 2 hours of your bedtime, try to keep your bedroom conducive for sleep, that is, cool and dark, without light distractors such as an illuminated alarm clock, and refrain from watching TV right before sleep or in the middle of the night and do not keep the TV or radio on during the night. Also, try not to use or play on electronic devices at bedtime, such as your cell phone, tablet PC or laptop. If you like to read at bedtime on an electronic device, try to dim the background light as much as possible. Do not eat in the middle of the night.   We will request a sleep study.    We will look for leg twitching and snoring or sleep apnea.   For chronic insomnia, you are best followed by a psychiatrist and/or sleep psychologist.   We will call you with the sleep study results and make a follow up appointment if needed.   Insomnia Insomnia is a sleep disorder that makes it difficult to fall asleep or stay asleep. Insomnia can cause fatigue, low energy, difficulty concentrating, mood swings, and poor performance at work or school. There are three different ways to classify insomnia:  Difficulty falling asleep.  Difficulty staying asleep.  Waking up too early in the morning. Any type of insomnia can be long-term (chronic) or short-term (acute). Both are common. Short-term insomnia usually lasts for three months or less. Chronic insomnia occurs at least three times a week for longer than three months. What are the causes? Insomnia may be caused by another condition, situation, or substance, such as:  Anxiety.  Certain medicines.  Gastroesophageal reflux disease (GERD) or other gastrointestinal conditions.  Asthma or other breathing conditions.  Restless legs syndrome, sleep apnea, or other sleep disorders.  Chronic pain.  Menopause.   Stroke.  Abuse of alcohol, tobacco, or illegal drugs.  Mental health conditions, such as depression.  Caffeine.  Neurological disorders, such as Alzheimer's disease.  An overactive thyroid (hyperthyroidism). Sometimes, the cause of insomnia may not be known. What increases the risk? Risk factors for insomnia include:  Gender. Women are affected more often than men.  Age. Insomnia is more common as you get older.  Stress.  Lack of exercise.  Irregular work schedule or working night shifts.  Traveling between different time zones.  Certain medical and mental health conditions. What are the signs or symptoms? If you have insomnia, the main symptom is having trouble falling asleep or having trouble staying asleep. This may lead to other symptoms, such as:  Feeling fatigued or having low energy.  Feeling nervous about going to sleep.  Not feeling rested in the morning.  Having trouble concentrating.  Feeling irritable, anxious, or depressed. How is this diagnosed? This condition may be diagnosed based on:  Your symptoms and medical history. Your health care provider may ask about: ? Your sleep habits. ? Any medical conditions you have. ? Your mental health.  A physical exam. How is this treated? Treatment for insomnia depends on the cause. Treatment may focus on treating an underlying condition that is causing insomnia. Treatment may also include:  Medicines to help you sleep.  Counseling or therapy.  Lifestyle adjustments to help you sleep better. Follow these instructions at home: Eating and drinking   Limit or avoid alcohol, caffeinated beverages, and cigarettes,   especially close to bedtime. These can disrupt your sleep.  Do not eat a large meal or eat spicy foods right before bedtime. This can lead to digestive discomfort that can make it hard for you to sleep. Sleep habits   Keep a sleep diary to help you and your health care provider figure out what  could be causing your insomnia. Write down: ? When you sleep. ? When you wake up during the night. ? How well you sleep. ? How rested you feel the next day. ? Any side effects of medicines you are taking. ? What you eat and drink.  Make your bedroom a dark, comfortable place where it is easy to fall asleep. ? Put up shades or blackout curtains to block light from outside. ? Use a white noise machine to block noise. ? Keep the temperature cool.  Limit screen use before bedtime. This includes: ? Watching TV. ? Using your smartphone, tablet, or computer.  Stick to a routine that includes going to bed and waking up at the same times every day and night. This can help you fall asleep faster. Consider making a quiet activity, such as reading, part of your nighttime routine.  Try to avoid taking naps during the day so that you sleep better at night.  Get out of bed if you are still awake after 15 minutes of trying to sleep. Keep the lights down, but try reading or doing a quiet activity. When you feel sleepy, go back to bed. General instructions  Take over-the-counter and prescription medicines only as told by your health care provider.  Exercise regularly, as told by your health care provider. Avoid exercise starting several hours before bedtime.  Use relaxation techniques to manage stress. Ask your health care provider to suggest some techniques that may work well for you. These may include: ? Breathing exercises. ? Routines to release muscle tension. ? Visualizing peaceful scenes.  Make sure that you drive carefully. Avoid driving if you feel very sleepy.  Keep all follow-up visits as told by your health care provider. This is important. Contact a health care provider if:  You are tired throughout the day.  You have trouble in your daily routine due to sleepiness.  You continue to have sleep problems, or your sleep problems get worse. Get help right away if:  You have serious  thoughts about hurting yourself or someone else. If you ever feel like you may hurt yourself or others, or have thoughts about taking your own life, get help right away. You can go to your nearest emergency department or call:  Your local emergency services (911 in the [place]  A suicide crisis helpline, such as the [facility] at (973)306-7063. This is open 24 hours a day. Summary  Insomnia is a sleep disorder that makes it difficult to fall asleep or stay asleep.  Insomnia can be long-term (chronic) or short-term (acute).  Treatment for insomnia depends on the cause. Treatment may focus on treating an underlying condition that is causing insomnia.  Keep a sleep diary to help you and your health care provider figure out what could be causing your insomnia. This information is not intended to replace advice given to you by your health care provider. Make sure you discuss any questions you have with your health care provider. Document Released: 04/05/2000 Document Revised: 03/21/2017 Document Reviewed: 01/16/2017 Elsevier Patient Education  2020 [employer].

## 2018-12-10 NOTE — Progress Notes (Signed)
SLEEP MEDICINE CLINIC    Provider:  Larey Seat, MD  Primary Care Physician:  Hoyt Koch, MD [place] [id number]     Referring Provider: Venancio Poisson, NP; stroke team          Chief Complaint according to patient   Patient presents with:    . New Patient (Initial Visit)           HISTORY OF PRESENT ILLNESS:  Debra [name] is a 42 y.o. year old 50 or [place] American female patient seen here upon referral by Venancio Poisson on 12/10/2018    Chief concern according to patient : " My sleeping patten has changed with my recent stroke- and started in March 19th 2019 ""  My doctors d/c my hormone replacement therapy  after my strokewhich has helped me to sleep'-      I have the pleasure of seeing Debra [name] today, a right-handed [place] American female with a possible sleep disorder.  She has had a stroke in the left MCA - Dr. Erlinda Hong.   has a past medical history of Allergic rhinitis, cause unspecified, Arthritis, Asthma, Diabetes mellitus type II (08/2009 dx), Endometriosis, Hyperlipemia, Hypertension, Knee pain, right, Lactose intolerance, Morbid obesity ([place]), Osteoarthritis, Ovarian cyst, and Stroke ([place]). she is in surgical menopause , status post total hysterectomy ( 2015). The patient never had a sleep study.   History Summary [name] is a 43 y.o. female with history of HTN, type 2 DM, morbid obesity, endometriosis/ovarian cyst s/p hysterectomy/SBO and seasonal allergies admitted on 06/30/17 for slurred speech and expressive aphasia.  CT head showed small cortical hypoattenuation within the left parietal occipital junction possible acute infarct. CTA head and neck on admission multiple anterior and posterior stenosis, and MRI showed a cluster of acute infarct posterior left temporal lobe and temporoparietal junction region in the left MCA territory.  After admission, patient had fluctuating left MCA syndrome especially  with positional changes and BP changes.  Repeat CT showed worsening stable left medial occipital lobe infarct.  Repeat CT head and neck showed unchanged bilateral high-grade M2 stenosis.  CT perfusion with small perfusion deficit, not amendable to intervention.  Repeat MRI showed patchy acute infarct in the left posterior MCA and watershed distributions.  Underwent cerebral angiogram on 07/04/2017, found to have left MCA inferior division >75% stenosis and right M1 80% stenosis, no vasculitis but atherosclerosis.  At end of procedure patient had a vasovagal event while holding pressure at the right groin.  BP dropped and she again had left MCA syndrome.  She was given IV bolus, put into Trendelenburg position and treated with pressors.  BP improved and symptoms resolved.  She was admitted in ICU after procedure, continues to have mild expressive aphasia but motor function at baseline.  LDL 129, and A1c 8.5.  UDS negative.  Hypercoagulable work-up negative.  She received left MCA stenting on 07/07/2017.  Her symptoms improved and was discharged home on 07/08/2017 with home health PT/speech.  Home medication including aspirin, Brilinta and Lipitor 80.     Sleep relevant medical history:  Tonsillectomy; none , cervical spine surgery no ENT surgery or trauma. She is morbidly obese.      Family medical /sleep history: No other family member on CPAP with OSA. father had a CVA in his 41s and remained aphasic. HTN, DM .    Social history:  Patient is retired from school bus driver - she became  disabled with a fall , not with the stroke.   and lives in a household with 2 persons- she and her mother . Family status is single. Pets are not  present. Tobacco use: none .  ETOH use ; none . Caffeine intake in form of Coffee (none) Soda(2 cans a day ) Tea ( rarely) , no energy drinks. Regular exercise in form of walking  Hobbies :  Shooting.     Sleep habits are as follows:  The patient's dinner time is between 6-7  PM. The patient goes to bed at 11-12 PM and continues to have trouble to initiate sleep for several hours, most nights. If asleep, stays asleep for hours. She is easily woken by sounds She may wake for one bathroom break - but cannot go back to sleep after. Her bedroom is not cool, quiet and dark- her mother needs the home warmer. She sweats a lot.  The preferred sleep position is lateral , with the support of 2 pillows. Dreams are reportedly frequent and vivid. Not nightmarish.    10.30 AM is the usual rise time. The patient wakes up spontaneously.  She reports not feeling refreshed or restored in AM, rarely having symptoms such as dry mouth or morning headaches but residual fatigue.  Naps are not taken.   Review of Systems: Out of a complete 14 system review, the patient complains of only the following symptoms, and all other reviewed systems are negative.:  Fatigue,not sleepiness- snoring reported by her mother. Menopausal symptoms, hot flashes. ,  fragmented sleep, Insomnia   She had a stroke with dysarthria, garbled speech. Right hand weakness.    How likely are you to doze in the following situations: 0 = not likely, 1 = slight chance, 2 = moderate chance, 3 = high chance   Sitting and Reading? Watching Television? Sitting inactive in a public place (theater or meeting)? As a passenger in a car for an hour without a break? Lying down in the afternoon when circumstances permit? Sitting and talking to someone? Sitting quietly after lunch without alcohol? In a car, while stopped for a few minutes in traffic?   Total = 1/ 24 points   FSS endorsed at 38/ 63 points.   Social History   Socioeconomic History  . Marital status: Single    Spouse name: n/a  . Number of children: 0  . Years of education: 12+  . Highest education level: Not on file  Occupational History  . Occupation: Retired  Scientific laboratory technician  . Financial resource strain: Not on file  . Food insecurity    Worry: Not  on file    Inability: Not on file  . Transportation needs    Medical: Not on file    Non-medical: Not on file  Tobacco Use  . Smoking status: Never Smoker  . Smokeless tobacco: Never Used  Substance and Sexual Activity  . Alcohol use: No    Alcohol/week: 0.0 standard drinks  . Drug use: No  . Sexual activity: Not on file  Lifestyle  . Physical activity    Days per week: Not on file    Minutes per session: Not on file  . Stress: Not on file  Relationships  . Social Herbalist on phone: Not on file    Gets together: Not on file    Attends religious service: Not on file    Active member of club or organization: Not on file    Attends meetings of  clubs or organizations: Not on file    Relationship status: Not on file  Other Topics Concern  . Not on file  Social History Narrative   Single, lives with mom.    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   Completed some college.    Family History  Problem Relation Age of Onset  . Heart disease Mother   . Hypertension Mother   . Diabetes Mother   . Stroke Father 22  . Diabetes Father   . Hypertension Father   . Hyperlipidemia Father   . Heart disease Father   . Kidney disease Father   . Cancer Father   . Liver disease Father   . Obesity Father     Past Medical History:  Diagnosis Date  . Allergic rhinitis, cause unspecified   . Arthritis   . Asthma   . Diabetes mellitus type II 08/2009 dx  . Endometriosis   . Hyperlipemia   . Hypertension   . Knee pain, right    DJD, post traumatic with repeat falls  . Lactose intolerance   . Morbid obesity ([employer])    s/p lab band 09/2010  . Osteoarthritis   . Ovarian cyst    right s/p resection June 2013  . Stroke [facility])     Past Surgical History:  Procedure Laterality Date  . ABDOMINAL HYSTERECTOMY  05/14/12   [place]  . IR ANGIO INTRA EXTRACRAN SEL COM CAROTID INNOMINATE  BILAT MOD SED  07/04/2017  . IR ANGIO VERTEBRAL SEL VERTEBRAL BILAT MOD SED  07/04/2017  . IR INTRA CRAN STENT  07/07/2017  . IR RADIOLOGIST EVAL & MGMT  08/13/2017  . LAPAROSCOPIC GASTRIC BANDING  10/02/2010   start weight 349#  . RADIOLOGY WITH ANESTHESIA N/A 07/07/2017   Procedure: STENTING;  Surgeon: Luanne Bras, MD;  Location: [facility];  Service: Radiology;  Laterality: N/A;  . SALPINGOOPHORECTOMY  10/08/11   right     Current Outpatient Medications on File Prior to Visit  Medication Sig Dispense Refill  . albuterol (PROAIR HFA) 108 (90 BASE) MCG/ACT inhaler Inhale 2 puffs into the lungs every 4 (four) hours as needed for wheezing or shortness of breath. 18 g 2  . albuterol (PROVENTIL) (2.5 MG/3ML) 0.083% nebulizer solution Take 2.5 mg by nebulization every 6 (six) hours as needed for wheezing or shortness of breath.    [name] amLODipine (NORVASC) 2.5 MG tablet Take 3 tablets (7.5 mg total) by mouth daily. 90 tablet 0  . aspirin EC 81 MG tablet Take 81 mg by mouth daily.    . blood glucose meter kit and supplies Dispense based on patient and insurance preference. Use up to four times daily as directed. (FOR ICD-9 250.00, 250.01). 1 each 0  . Docusate Sodium (COLACE PO) Take by mouth.    . fexofenadine (ALLEGRA) 180 MG tablet Take 180 mg by mouth daily as needed. For seasonal allergies    . fluticasone (FLONASE) 50 MCG/ACT nasal spray Place 2 sprays into both nostrils daily. 16 g 6  . glucose blood test strip Used to check blood sugars up to four times daily as needed 200 each 3  . ibuprofen (ADVIL,MOTRIN) 800 MG tablet ibuprofen 800 mg tablet    . Lancets MISC Used to check blood sugars up to four times daily as needed 200 each 3  . losartan (COZAAR) 100 MG tablet TAKE 1 TABLET(100 MG) BY MOUTH DAILY  90 tablet 1  . olopatadine (PATANOL) 0.1 % ophthalmic solution Apply to eye.    . rosuvastatin (CRESTOR) 40 MG tablet Take 1 tablet (40 mg total) by mouth daily. 90 tablet 0  . Semaglutide  (RYBELSUS) 14 MG TABS Take 14 mg by mouth daily. 30 tablet 0  . ticagrelor (BRILINTA) 90 MG TABS tablet Take 0.5 tablets (45 mg total) by mouth 2 (two) times daily. 60 tablet 0   No current facility-administered medications on file prior to visit.     Allergies  Allergen Reactions  . Dust Mite Extract   . Influenza Vaccines   . Metformin And Related Diarrhea  . Morphine Hives  . Nutritional Supplements     NUT allergy  . Augmentin [Amoxicillin-Pot Clavulanate] Rash    To bilat post hands only    Physical exam:  Today's Vitals   12/10/18 1254  BP: (!) 141/94  Pulse: 68  Weight: (!) 331 lb (150.1 kg)  Height: _0  (1.727 m)   Body mass index is 50.33 kg/m.   Wt Readings from Last 3 Encounters:  12/10/18 (!) 331 lb (150.1 kg)  12/04/18 (!) 332 lb (150.6 kg)  12/02/18 (!) 329 lb (149.2 kg)     Ht Readings from Last 3 Encounters:  12/10/18 _1  (1.727 m)  12/04/18 _2  (1.702 m)  12/02/18 _3  (1.702 m)      General: The patient is awake, alert and appears not in acute distress. She is very friendly and talkative. The patient is well groomed. Head: Normocephalic, atraumatic. Neck is supple. Mallampati 4,  neck circumference:16 inches .  Nasal airflow patent.  Retrognathia is mildly seen.  Dental status: intact  Cardiovascular:  Regular rate and cardiac rhythm by pulse,  without distended neck veins. Respiratory: Lungs are clear to auscultation.  Skin:  Without evidence of ankle edema, or rash. Trunk: The patient's posture is erect.   Neurologic exam : The patient is awake and alert, oriented to place and time.   Memory subjective described as intact.  Attention span & concentration ability appears normal.  Speech is fluent, without dysarthria, dysphonia or aphasia.  Mood and affect are appropriate.   Cranial nerves: no loss of smell or taste reported  Pupils are equal and briskly reactive to light. Funduscopic exam deferred.  Extraocular movements in  vertical and horizontal planes were intact and without nystagmus. No Diplopia. Visual fields by finger perimetry are intact. Hearing was intact to soft voice and finger rubbing.    Facial sensation intact to fine touch.  Facial motor strength is symmetric and tongue and uvula move midline.  Neck ROM : rotation, tilt and flexion extension were normal for age and shoulder shrug was symmetrical.    Motor exam:  Symmetric bulk, tone and ROM.   Normal tone without cog wheeling, symmetric grip strength .   Sensory:  Fine touch, pinprick and vibration were tested  and  normal.  Proprioception tested in the upper extremities was normal.   Coordination: Rapid alternating movements in the fingers/hands were of normal speed.  The Finger-to-nose maneuver was intact without evidence of ataxia, dysmetria or tremor.   Gait and station: Patient could rise unassisted from a seated position, walked without assistive device.  Stance is of normal width/ base and the patient turned with 3 steps.  Toe and heel walk were deferred.  Deep tendon reflexes: in the  upper and lower extremities are symmetric and intact.  Babinski response was deferred.  After spending a total time of  35 minutes face to face and additional time for physical and neurologic examination, review of laboratory studies,  personal review of imaging studies, reports and results of other testing and review of referral information / records as far as provided in visit, I have established the following assessments:  1)  Patient had a very good recovery from MCA stroke, had stent placed in March 2020 and is doing well in regards to motor, sensory and cognitive function.   2)  OSA ? She snores loudly , and she is morbidly obese. Has retrognathia and high grade Mallampati. She denies sleepiness.    3) Insomnia related to anxiety -  She is afraid to have another stroke in her sleep. There is also the re-onset of menopausal symptoms now that  she cannot longer use hormonal replacement therapy.     My Plan is to proceed with:  1) I will order a sleep apnea evaluation,   2) I recommended to try estrogen cream for some reduction of menopausal symptoms,   3) Melatonin 5 mg or less before bedtime.   I would like to thank Venancio Poisson, NP  for allowing me to meet with and to take care of this pleasant patient.   In short, Debra [name] is presenting with Insomnia , a symptom that can be attributed to anxiety, and menopausal therapy.   I plan to follow up either personally or through our NP within 2-3  month.     Electronically signed by: Larey Seat, MD 12/10/2018 1:02 PM  [facility] certified by The [employer] of Sleep Medicine and Diplomate of the [employer] of Sleep Medicine. Board certified In Neurology through the [place], Fellow of the [employer] of Neurology. Medical Director of [employer].

## 2018-12-15 ENCOUNTER — Other Ambulatory Visit (INDEPENDENT_AMBULATORY_CARE_PROVIDER_SITE_OTHER): Payer: Self-pay | Admitting: Family Medicine

## 2018-12-15 MED ORDER — AMLODIPINE BESYLATE 2.5 MG PO TABS: 7.5000 mg | ORAL_TABLET | Freq: Every day | ORAL | 0 refills | Status: DC

## 2018-12-18 ENCOUNTER — Telehealth: Payer: Self-pay | Admitting: Student

## 2018-12-18 NOTE — Telephone Encounter (Signed)
NIR.  Received prescription refill request from pharmacy for [place].  Faxed request to [facility] in [place], [place] [phone]) at [phone] to fill prescription- Brilinta 90 mg tablets, take 1/2 tablet by mouth twice daily, dispense 30 tablets with 3 refills.   Bea Graff Lasharn Bufkin, PA-C 12/18/2018, 3:42 PM

## 2018-12-21 ENCOUNTER — Ambulatory Visit (INDEPENDENT_AMBULATORY_CARE_PROVIDER_SITE_OTHER): Payer: BC Managed Care – PPO | Admitting: Family Medicine

## 2018-12-23 ENCOUNTER — Ambulatory Visit (INDEPENDENT_AMBULATORY_CARE_PROVIDER_SITE_OTHER): Payer: BC Managed Care – PPO | Admitting: Neurology

## 2018-12-23 ENCOUNTER — Other Ambulatory Visit: Payer: Self-pay

## 2018-12-23 DIAGNOSIS — G4733 Obstructive sleep apnea (adult) (pediatric): Secondary | ICD-10-CM | POA: Diagnosis not present

## 2018-12-23 DIAGNOSIS — I63512 Cerebral infarction due to unspecified occlusion or stenosis of left middle cerebral artery: Secondary | ICD-10-CM

## 2018-12-23 DIAGNOSIS — J4521 Mild intermittent asthma with (acute) exacerbation: Secondary | ICD-10-CM

## 2018-12-23 DIAGNOSIS — E1165 Type 2 diabetes mellitus with hyperglycemia: Secondary | ICD-10-CM

## 2018-12-23 DIAGNOSIS — I10 Essential (primary) hypertension: Secondary | ICD-10-CM

## 2018-12-23 DIAGNOSIS — F5104 Psychophysiologic insomnia: Secondary | ICD-10-CM

## 2018-12-23 DIAGNOSIS — N951 Menopausal and female climacteric states: Secondary | ICD-10-CM

## 2018-12-23 DIAGNOSIS — R0683 Snoring: Secondary | ICD-10-CM

## 2019-01-07 ENCOUNTER — Other Ambulatory Visit (INDEPENDENT_AMBULATORY_CARE_PROVIDER_SITE_OTHER): Payer: Self-pay | Admitting: Family Medicine

## 2019-01-08 ENCOUNTER — Other Ambulatory Visit: Payer: Self-pay

## 2019-01-08 DIAGNOSIS — F5104 Psychophysiologic insomnia: Secondary | ICD-10-CM | POA: Insufficient documentation

## 2019-01-08 DIAGNOSIS — N951 Menopausal and female climacteric states: Secondary | ICD-10-CM | POA: Insufficient documentation

## 2019-01-08 DIAGNOSIS — G4733 Obstructive sleep apnea (adult) (pediatric): Secondary | ICD-10-CM | POA: Insufficient documentation

## 2019-01-08 DIAGNOSIS — I63512 Cerebral infarction due to unspecified occlusion or stenosis of left middle cerebral artery: Secondary | ICD-10-CM | POA: Insufficient documentation

## 2019-01-08 DIAGNOSIS — R0683 Snoring: Secondary | ICD-10-CM | POA: Insufficient documentation

## 2019-01-08 MED ORDER — LOSARTAN POTASSIUM 100 MG PO TABS: ORAL_TABLET | ORAL | 1 refills | Status: DC

## 2019-01-08 NOTE — Addendum Note (Signed)
Addended by: Larey Seat on: 01/08/2019 11:12 AM   Modules accepted: Orders

## 2019-01-08 NOTE — Procedures (Signed)
PATIENT'S NAME:  Debra [name], Debra [name] DOB:      [birth year]      MR#:    [id number]     DATE OF RECORDING: 12/23/2018 REFERRING M.D.:  Antony Contras Stroke MD/ NP Venancio Poisson  Study Performed:   Baseline Polysomnogram HISTORY:  Debra [name] is a 43 year old African-American Female with a BMI of 58, having recently suffered a stroke. She is referred by NP Venancio Poisson with the Stroke Team to evaluate for OSA. History of Asthma, Diabetes, HTN. Surgical menopause after Endometriosis, Hot flushes in sleep, Chronic Insomnia. The patient endorsed the Epworth Sleepiness Scale at 1 point. The patient's weight 331 pounds with a height of 68 (inches), resulting in a BMI of 50.1 kg/m2.  The patient goes to bed at 11-12 PM and continues to have trouble to initiate sleep for several hours on most nights. If asleep, stays asleep for hours. She is easily woken by sounds She may wake for one bathroom break - but cannot go back to sleep after. She sweats a lot.  Dreams are reportedly frequent and vivid. 10.30 AM is the usual rise time, she wakes up spontaneously.  She reports not feeling refreshed or restored in AM with residual fatigue.   CURRENT MEDICATIONS: Albuterol, Norvasc, Aspirin, Allegra, Flonase, Cozaar, Pantanal, Crestor, Rybelsus, Brilinta   PROCEDURE:  This is a multichannel digital polysomnogram utilizing the Somnostar 11.2 system.  Electrodes and sensors were applied and monitored per AASM Specifications.   EEG, EOG, Chin and Limb EMG, were sampled at 200 Hz.  ECG, Snore and Nasal Pressure, Thermal Airflow, Respiratory Effort, CPAP Flow and Pressure, Oximetry was sampled at 50 Hz. Digital video and audio were recorded.      BASELINE STUDY: Lights Out was at 22:32 and Lights On at 05:10.  Total recording time (TRT) was 398 minutes, with a total sleep time (TST) of 239.5 minutes.   The patient's sleep latency was 11 minutes.  REM latency was 298 minutes.  The sleep efficiency was 60.2 %.      SLEEP ARCHITECTURE: WASO (Wake after sleep onset) was 147 minutes.  There were 22 minutes in Stage N1, 207.5 minutes Stage N2, 0 minutes Stage N3 and 10 minutes in Stage REM. The percentage of Stage N1 was 9.2%, Stage N2 was 86.6%, Stage N3 was 0% and Stage R (REM sleep) was 4.2%.    RESPIRATORY ANALYSIS:  There were a total of 22 respiratory events:  0 obstructive apneas, 0 central apneas and 0 mixed apneas with a total of 0 apneas and an apnea index (AI) of 0 /hour. There were 22 hypopneas with a hypopnea index of 5.5 /hour. The patient also had 5 respiratory event related arousals (RERAs).     The total APNEA/HYPOPNEA INDEX (AHI) was 5.5/hour and the total RESPIRATORY DISTURBANCE INDEX was 7.0 /hour.  4 events occurred in REM sleep and 36 events in NREM. The REM AHI was 24 /hour, versus a non-REM AHI of 4.7. The patient spent 145.5 minutes of total sleep time in the supine position and 94 minutes in non-supine. The supine AHI was 9.1 versus a non-supine AHI of 0.0.  OXYGEN SATURATION & C02:  The Wake baseline 02 saturation was 96%, with the lowest being 91%. Time spent below 89% saturation equaled 0 minutes. The arousals were noted as: 25 were spontaneous, 0 were associated with PLMs, and 17 were associated with respiratory events. The patient had a total of 0 Periodic Limb Movements.    Audio  and video analysis did not show any abnormal or unusual movements, behaviors, phonations or vocalizations. The patient woke several times spontaneously and audio recording indicates that a device was playing in the background, audible narrative was going on at 0.30 AM (?), not sure if this was a smartphone based video? It seems to not have helped her to go to sleep.  There was a strong association of loud snoring in supine sleep.  The patient took one bathroom break. Loud Snoring was noted. EKG was in keeping with normal sinus rhythm (NSR).  IMPRESSION:  1. Mild Obstructive Sleep Apnea (OSA). 2. Loud,  Primary Snoring. 3. Behavior dysfunctions associated with sleep stages or arousal from sleep. 4. Normal EKG, at average 65 bpm.   RECOMMENDATIONS:  1. Advise to address sleep hygiene and behavior first. Patient needs to get off devices in the middle of the night. She could have asked for the installed sound machine to generate background sounds but was on her phone. Her periods of wakefulness were very long.  2. I will order also a full-night, attended, CPAP titration study to optimize therapy.  3. Weight loss will likely treat this mild sleep apnea completely, and avoiding supine sleep will also help.     I certify that I have reviewed the entire raw data recording prior to the issuance of this report in accordance with the Standards of Accreditation of the American Academy of Sleep Medicine (AASM)    Melvyn Novasarmen Kami Kube, MD 01-06-2019 Diplomat, American Board of Psychiatry and Neurology  Diplomat, American Board of Sleep Medicine Medical Director, [facility]

## 2019-01-11 ENCOUNTER — Telehealth: Payer: Self-pay | Admitting: Neurology

## 2019-01-11 ENCOUNTER — Encounter: Payer: Self-pay | Admitting: Neurology

## 2019-01-11 NOTE — Telephone Encounter (Signed)
-----   Message from Larey Seat, MD sent at 01/08/2019 11:12 AM EDT ----- The patient woke several times spontaneously and audio recording indicates that a device was playing in the background, audible narrative was going on at 0.30 AM (?), not sure if this was a smartphone based video? It seems to not have helped her to go to sleep.  There was a strong association of loud snoring in supine sleep.  The patient took one bathroom break. Loud Snoring was noted. EKG was in keeping with normal sinus rhythm (NSR).  IMPRESSION:  1. Mild Obstructive Sleep Apnea (OSA). 2. Loud, Primary Snoring. 3. Behavior dysfunctions associated with sleep stages or arousal from sleep. 4. Normal EKG, at average 65 bpm.   RECOMMENDATIONS:  1. Advise to address sleep hygiene and behavior first. Patient needs to get off devices in the middle of the night. She could have asked for the installed sound machine to generate background sounds but was on her phone. Her periods of wakefulness were very long.  2. I will order also a full-night, attended, CPAP titration study to optimize therapy.  3. Weight loss will likely treat this mild sleep apnea completely, and avoiding supine sleep will also help.

## 2019-01-11 NOTE — Telephone Encounter (Signed)
Called patient to discuss sleep study results. No answer at this time. LVM for the patient to call back.  Will also send a mychart message.  

## 2019-01-12 ENCOUNTER — Other Ambulatory Visit (INDEPENDENT_AMBULATORY_CARE_PROVIDER_SITE_OTHER): Payer: Self-pay | Admitting: Family Medicine

## 2019-01-14 ENCOUNTER — Encounter (INDEPENDENT_AMBULATORY_CARE_PROVIDER_SITE_OTHER): Payer: Self-pay | Admitting: Family Medicine

## 2019-01-14 ENCOUNTER — Telehealth (INDEPENDENT_AMBULATORY_CARE_PROVIDER_SITE_OTHER): Payer: BC Managed Care – PPO | Admitting: Family Medicine

## 2019-01-14 ENCOUNTER — Other Ambulatory Visit: Payer: Self-pay

## 2019-01-14 DIAGNOSIS — E1165 Type 2 diabetes mellitus with hyperglycemia: Secondary | ICD-10-CM | POA: Diagnosis not present

## 2019-01-14 DIAGNOSIS — Z6841 Body Mass Index (BMI) 40.0 and over, adult: Secondary | ICD-10-CM | POA: Diagnosis not present

## 2019-01-14 DIAGNOSIS — I10 Essential (primary) hypertension: Secondary | ICD-10-CM

## 2019-01-14 MED ORDER — AMLODIPINE BESYLATE 2.5 MG PO TABS: 7.5000 mg | ORAL_TABLET | Freq: Every day | ORAL | 0 refills | Status: DC

## 2019-01-14 MED ORDER — RYBELSUS 14 MG PO TABS: 14.0000 mg | ORAL_TABLET | Freq: Every day | ORAL | 0 refills | Status: DC

## 2019-01-18 NOTE — Progress Notes (Signed)
Office: [phone]  /  Fax: [phone] TeleHealth Visit:  Debra [name] has verbally consented to this TeleHealth visit today. The patient is located at home, the provider is located at the [facility] office. The participants in this visit include the listed provider and patient. The visit was conducted today via face time.  HPI:   Chief Complaint: OBESITY Debra [name] is here to discuss her progress with her obesity treatment plan. She is on the Category 3 plan and is following her eating plan approximately 80 % of the time. She states she is walking for 30-45 minutes 6 times per week. Debra [name] started a new job at the [employer] helping with [employer]. She denies hunger or cravings. She finds herself eating more at her new job which has led to her following the plan more frequent. She is occasionally eating off the plan.  We were unable to weigh the patient today for this TeleHealth visit. She feels as if she has lost weight since her last visit. She has lost 0 lbs since starting treatment with [name].  Hypertension Debra [name] is a 43 y.o. female with hypertension. Denia's blood pressure was controlled at her previous appointment. She denies chest pain, chest pressure, or headaches. She is working on weight loss to help control her blood pressure with the goal of decreasing her risk of heart attack and stroke.   Diabetes II with Hyperglycemia Debra [name] has a diagnosis of diabetes type II. Debra [name] states her BGs range in 140's. She denies hypoglycemia or GI side effects of Rybelsus. Last A1c was 7.9. She has been working on intensive lifestyle modifications including diet, exercise, and weight loss to help control her blood glucose levels.  ASSESSMENT AND PLAN:  Essential hypertension - Plan: amLODipine (NORVASC) 2.5 MG tablet  Type 2 diabetes mellitus with hyperglycemia, without long-term current use of insulin (HCC) - Plan: Semaglutide (RYBELSUS) 14 MG TABS   Class 3 severe obesity with serious comorbidity and body mass index (BMI) of 50.0 to 59.9 in adult, unspecified obesity type ([place])  PLAN:  Hypertension We discussed sodium restriction, working on healthy weight loss, and a regular exercise program as the means to achieve improved blood pressure control. Tamelia agreed with this plan and agreed to follow up as directed. We will continue to monitor her blood pressure as well as her progress with the above lifestyle modifications. Debra [name] agrees to continue taking amlodipine 75 mg PO daily #90 day supply with no refills. She will watch for signs of hypotension as she continues her lifestyle modifications. Debra [name] agrees to follow up with our clinic in 2 to 3 weeks.  Diabetes II with Hyperglycemia Debra [name] has been given extensive diabetes education by myself today including ideal fasting and post-prandial blood glucose readings, individual ideal Hgb A1c goals and hypoglycemia prevention. We discussed the importance of good blood sugar control to decrease the likelihood of diabetic complications such as nephropathy, neuropathy, limb loss, blindness, coronary artery disease, and death. We discussed the importance of intensive lifestyle modification including diet, exercise and weight loss as the first line treatment for diabetes. Debra [name] agrees to continue taking Rybelsus 14 mg PO daily #30 and we will refill for 1 month. Aurore agrees to follow up with our clinic in 2 to 3 weeks.  Obesity Debra [name] is currently in the action stage of change. As such, her goal is to continue with weight loss efforts She has agreed to follow the Category 3 plan Debra [name] has been instructed  to work up to a goal of 150 minutes of combined cardio and strengthening exercise per week for weight loss and overall health benefits. We discussed the following Behavioral Modification Strategies today: increasing lean protein intake, increasing vegetables and work on meal planning and  easy cooking plans, keeping healthy foods in the home, better snacking choices, and planning for success   Debra [name] has agreed to follow up with our clinic in 2 to 3 weeks. She was informed of the importance of frequent follow up visits to maximize her success with intensive lifestyle modifications for her multiple health conditions.  ALLERGIES: Allergies  Allergen Reactions  . Dust Mite Extract   . Influenza Vaccines   . Metformin And Related Diarrhea  . Morphine Hives  . Nutritional Supplements     NUT allergy  . Augmentin [Amoxicillin-Pot Clavulanate] Rash    To bilat post hands only    MEDICATIONS: Current Outpatient Medications on File Prior to Visit  Medication Sig Dispense Refill  . albuterol (PROAIR HFA) 108 (90 BASE) MCG/ACT inhaler Inhale 2 puffs into the lungs every 4 (four) hours as needed for wheezing or shortness of breath. 18 g 2  . albuterol (PROVENTIL) (2.5 MG/3ML) 0.083% nebulizer solution Take 2.5 mg by nebulization every 6 (six) hours as needed for wheezing or shortness of breath.    [name] aspirin EC 81 MG tablet Take 81 mg by mouth daily.    . blood glucose meter kit and supplies Dispense based on patient and insurance preference. Use up to four times daily as directed. (FOR ICD-9 250.00, 250.01). 1 each 0  . Docusate Sodium (COLACE PO) Take by mouth.    . fexofenadine (ALLEGRA) 180 MG tablet Take 180 mg by mouth daily as needed. For seasonal allergies    . fluticasone (FLONASE) 50 MCG/ACT nasal spray Place 2 sprays into both nostrils daily. 16 g 6  . glucose blood test strip Used to check blood sugars up to four times daily as needed 200 each 3  . ibuprofen (ADVIL,MOTRIN) 800 MG tablet ibuprofen 800 mg tablet    . Lancets MISC Used to check blood sugars up to four times daily as needed 200 each 3  . losartan (COZAAR) 100 MG tablet TAKE 1 TABLET(100 MG) BY MOUTH DAILY 90 tablet 1  . olopatadine (PATANOL) 0.1 % ophthalmic solution Apply to eye.    . rosuvastatin  (CRESTOR) 40 MG tablet Take 1 tablet (40 mg total) by mouth daily. 90 tablet 0  . ticagrelor (BRILINTA) 90 MG TABS tablet Take 0.5 tablets (45 mg total) by mouth 2 (two) times daily. 60 tablet 0   No current facility-administered medications on file prior to visit.     PAST MEDICAL HISTORY: Past Medical History:  Diagnosis Date  . Allergic rhinitis, cause unspecified   . Arthritis   . Asthma   . Diabetes mellitus type II 08/2009 dx  . Endometriosis   . Hyperlipemia   . Hypertension   . Knee pain, right    DJD, post traumatic with repeat falls  . Lactose intolerance   . Morbid obesity ([place])    s/p lab band 09/2010  . Osteoarthritis   . Ovarian cyst    right s/p resection June 2013  . Stroke [facility])     PAST SURGICAL HISTORY: Past Surgical History:  Procedure Laterality Date  . ABDOMINAL HYSTERECTOMY  05/14/12   [place]  . IR ANGIO INTRA EXTRACRAN SEL COM CAROTID INNOMINATE BILAT MOD SED  07/04/2017  .  IR ANGIO VERTEBRAL SEL VERTEBRAL BILAT MOD SED  07/04/2017  . IR INTRA CRAN STENT  07/07/2017  . IR RADIOLOGIST EVAL & MGMT  08/13/2017  . LAPAROSCOPIC GASTRIC BANDING  10/02/2010   start weight 349#  . RADIOLOGY WITH ANESTHESIA N/A 07/07/2017   Procedure: STENTING;  Surgeon: Luanne Bras, MD;  Location: [place];  Service: Radiology;  Laterality: N/A;  . SALPINGOOPHORECTOMY  10/08/11   right    SOCIAL HISTORY: Social History   Tobacco Use  . Smoking status: Never Smoker  . Smokeless tobacco: Never Used  Substance Use Topics  . Alcohol use: No    Alcohol/week: 0.0 standard drinks  . Drug use: No    FAMILY HISTORY: Family History  Problem Relation Age of Onset  . Heart disease Mother   . Hypertension Mother   . Diabetes Mother   . Stroke Father 2  . Diabetes Father   . Hypertension Father   . Hyperlipidemia Father   . Heart disease Father   . Kidney disease Father   . Cancer Father   . Liver disease Father   . Obesity Father     ROS: Review  of Systems  Constitutional: Positive for weight loss.  Cardiovascular: Negative for chest pain.       Negative chest pressure  Neurological: Negative for headaches.  Endo/Heme/Allergies:       Negative hypoglycemia    PHYSICAL EXAM: Pt in no acute distress  RECENT LABS AND TESTS: BMET    Component Value Date/Time   NA 142 07/01/2018 1412   K 3.7 07/01/2018 1412   CL 103 07/01/2018 1412   CO2 21 07/01/2018 1412   GLUCOSE 173 (H) 07/01/2018 1412   GLUCOSE 249 (H) 01/05/2018 1013   BUN 10 07/01/2018 1412   CREATININE 0.75 07/01/2018 1412   CALCIUM 9.2 07/01/2018 1412   GFRNONAA 97 07/01/2018 1412   GFRAA 112 07/01/2018 1412   Lab Results  Component Value Date   HGBA1C 7.9 (A) 10/09/2018   HGBA1C 9.7 (A) 07/09/2018   HGBA1C 9.2 (A) 04/09/2018   HGBA1C 11.1 (H) 01/05/2018   HGBA1C 11.2 (H) 10/02/2017   Lab Results  Component Value Date   INSULIN 8.9 07/01/2018   CBC    Component Value Date/Time   WBC 9.9 07/01/2018 1412   WBC 8.7 01/05/2018 1013   RBC 5.24 07/01/2018 1412   RBC 5.10 01/05/2018 1013   HGB 13.5 07/01/2018 1412   HCT 42.7 07/01/2018 1412   PLT 314.0 01/05/2018 1013   MCV 82 07/01/2018 1412   MCH 25.8 (L) 07/01/2018 1412   MCH 26.7 07/09/2017 0606   MCHC 31.6 07/01/2018 1412   MCHC 32.5 01/05/2018 1013   RDW 13.7 07/01/2018 1412   LYMPHSABS 2.2 07/01/2018 1412   MONOABS 0.3 07/08/2017 0607   EOSABS 0.3 07/01/2018 1412   BASOSABS 0.1 07/01/2018 1412   Iron/TIBC/Ferritin/ %Sat No results found for: IRON, TIBC, FERRITIN, IRONPCTSAT Lipid Panel     Component Value Date/Time   CHOL 90 10/09/2018 1328   CHOL 156 05/20/2018 1321   TRIG 55.0 10/09/2018 1328   HDL 42.10 10/09/2018 1328   HDL 56 05/20/2018 1321   CHOLHDL 2 10/09/2018 1328   VLDL 11.0 10/09/2018 1328   LDLCALC 37 10/09/2018 1328   LDLCALC 85 05/20/2018 1321   Hepatic Function Panel     Component Value Date/Time   PROT 6.7 07/01/2018 1412   ALBUMIN 4.2 07/01/2018 1412   AST  14 07/01/2018 1412   ALT  19 07/01/2018 1412   ALKPHOS 109 07/01/2018 1412   BILITOT 0.3 07/01/2018 1412   BILIDIR 0.1 04/18/2015 1140      Component Value Date/Time   TSH 1.620 07/01/2018 1412   TSH 2.54 04/18/2015 1140   TSH 1.66 07/22/2013 1215      I, Trixie Dredge, am acting as transcriptionist for Ilene Qua, MD  I have reviewed the above documentation for accuracy and completeness, and I agree with the above. - Ilene Qua, MD

## 2019-02-01 ENCOUNTER — Inpatient Hospital Stay (HOSPITAL_COMMUNITY): Admission: RE | Admit: 2019-02-01 | Payer: Self-pay | Source: Ambulatory Visit

## 2019-02-03 ENCOUNTER — Other Ambulatory Visit (HOSPITAL_COMMUNITY): Payer: Self-pay | Admitting: Interventional Radiology

## 2019-02-03 ENCOUNTER — Other Ambulatory Visit (HOSPITAL_COMMUNITY): Payer: BC Managed Care – PPO

## 2019-02-03 DIAGNOSIS — I771 Stricture of artery: Secondary | ICD-10-CM

## 2019-02-08 ENCOUNTER — Encounter: Payer: Self-pay | Admitting: Internal Medicine

## 2019-02-08 ENCOUNTER — Other Ambulatory Visit: Payer: Self-pay

## 2019-02-08 ENCOUNTER — Ambulatory Visit (INDEPENDENT_AMBULATORY_CARE_PROVIDER_SITE_OTHER): Payer: BC Managed Care – PPO | Admitting: Internal Medicine

## 2019-02-08 VITALS — BP 122/78 | HR 67 | Temp 98.4°F | Ht 68.0 in | Wt 330.0 lb

## 2019-02-08 DIAGNOSIS — Z0000 Encounter for general adult medical examination without abnormal findings: Secondary | ICD-10-CM

## 2019-02-08 DIAGNOSIS — E785 Hyperlipidemia, unspecified: Secondary | ICD-10-CM

## 2019-02-08 DIAGNOSIS — Z8673 Personal history of transient ischemic attack (TIA), and cerebral infarction without residual deficits: Secondary | ICD-10-CM

## 2019-02-08 DIAGNOSIS — I10 Essential (primary) hypertension: Secondary | ICD-10-CM

## 2019-02-08 DIAGNOSIS — E1165 Type 2 diabetes mellitus with hyperglycemia: Secondary | ICD-10-CM

## 2019-02-08 DIAGNOSIS — E1169 Type 2 diabetes mellitus with other specified complication: Secondary | ICD-10-CM

## 2019-02-08 LAB — POCT GLYCOSYLATED HEMOGLOBIN (HGB A1C): Hemoglobin A1C: 8.5 % — AB (ref 4.0–5.6)

## 2019-02-08 MED ORDER — AMLODIPINE BESYLATE 2.5 MG PO TABS: 7.5000 mg | ORAL_TABLET | Freq: Every day | ORAL | 1 refills | Status: DC

## 2019-02-08 MED ORDER — RYBELSUS 14 MG PO TABS: 14.0000 mg | ORAL_TABLET | Freq: Every day | ORAL | 0 refills | Status: DC

## 2019-02-08 NOTE — Progress Notes (Signed)
   Subjective:   Patient ID: Debra [name], female    DOB: [birth year], 43 y.o.   MRN: [id number]  HPI The patient is a 43 YO female coming in for physical.   PMH, [place], social history reviewed and updated  Review of Systems  Constitutional: Negative.   HENT: Negative.   Eyes: Negative.   Respiratory: Negative for cough, chest tightness and shortness of breath.   Cardiovascular: Negative for chest pain, palpitations and leg swelling.  Gastrointestinal: Negative for abdominal distention, abdominal pain, constipation, diarrhea, nausea and vomiting.  Musculoskeletal: Negative.   Skin: Negative.   Neurological: Negative.   Psychiatric/Behavioral: Negative.     Objective:  Physical Exam Constitutional:      Appearance: She is well-developed.  HENT:     Head: Normocephalic and atraumatic.  Neck:     Musculoskeletal: Normal range of motion.  Cardiovascular:     Rate and Rhythm: Normal rate and regular rhythm.  Pulmonary:     Effort: Pulmonary effort is normal. No respiratory distress.     Breath sounds: Normal breath sounds. No wheezing or rales.  Abdominal:     General: Bowel sounds are normal. There is no distension.     Palpations: Abdomen is soft.     Tenderness: There is no abdominal tenderness. There is no rebound.  Skin:    General: Skin is warm and dry.     Comments: Foot exam done  Neurological:     Mental Status: She is alert and oriented to person, place, and time.     Coordination: Coordination normal.     Vitals:   02/08/19 1258  BP: 122/78  Pulse: 67  Temp: 98.4 F (36.9 C)  TempSrc: Oral  SpO2: 99%  Weight: (!) 330 lb (149.7 kg)  Height: 5\' 8"  (1.727 m)    Assessment & Plan:

## 2019-02-08 NOTE — Patient Instructions (Addendum)
Keep up the good work with diet and walking.   Health Maintenance, Female Adopting a healthy lifestyle and getting preventive care are important in promoting health and wellness. Ask your health care provider about:  The right schedule for you to have regular tests and exams.  Things you can do on your own to prevent diseases and keep yourself healthy. What should I know about diet, weight, and exercise? Eat a healthy diet   Eat a diet that includes plenty of vegetables, fruits, low-fat dairy products, and lean protein.  Do not eat a lot of foods that are high in solid fats, added sugars, or sodium. Maintain a healthy weight Body mass index (BMI) is used to identify weight problems. It estimates body fat based on height and weight. Your health care provider can help determine your BMI and help you achieve or maintain a healthy weight. Get regular exercise Get regular exercise. This is one of the most important things you can do for your health. Most adults should:  Exercise for at least 150 minutes each week. The exercise should increase your heart rate and make you sweat (moderate-intensity exercise).  Do strengthening exercises at least twice a week. This is in addition to the moderate-intensity exercise.  Spend less time sitting. Even light physical activity can be beneficial. Watch cholesterol and blood lipids Have your blood tested for lipids and cholesterol at 44 years of age, then have this test every 5 years. Have your cholesterol levels checked more often if:  Your lipid or cholesterol levels are high.  You are older than 44 years of age.  You are at high risk for heart disease. What should I know about cancer screening? Depending on your health history and family history, you may need to have cancer screening at various ages. This may include screening for:  Breast cancer.  Cervical cancer.  Colorectal cancer.  Skin cancer.  Lung cancer. What should I know  about heart disease, diabetes, and high blood pressure? Blood pressure and heart disease  High blood pressure causes heart disease and increases the risk of stroke. This is more likely to develop in people who have high blood pressure readings, are of African descent, or are overweight.  Have your blood pressure checked: ? Every 3-5 years if you are 6-50 years of age. ? Every year if you are 75 years old or older. Diabetes Have regular diabetes screenings. This checks your fasting blood sugar level. Have the screening done:  Once every three years after age 61 if you are at a normal weight and have a low risk for diabetes.  More often and at a younger age if you are overweight or have a high risk for diabetes. What should I know about preventing infection? Hepatitis B If you have a higher risk for hepatitis B, you should be screened for this virus. Talk with your health care provider to find out if you are at risk for hepatitis B infection. Hepatitis C Testing is recommended for:  Everyone born from [birth year] through 1966.  Anyone with known risk factors for hepatitis C. Sexually transmitted infections (STIs)  Get screened for STIs, including gonorrhea and chlamydia, if: ? You are sexually active and are younger than 44 years of age. ? You are older than 44 years of age and your health care provider tells you that you are at risk for this type of infection. ? Your sexual activity has changed since you were last screened, and you are at increased  risk for chlamydia or gonorrhea. Ask your health care provider if you are at risk.  Ask your health care provider about whether you are at high risk for HIV. Your health care provider may recommend a prescription medicine to help prevent HIV infection. If you choose to take medicine to prevent HIV, you should first get tested for HIV. You should then be tested every 3 months for as long as you are taking the medicine. Pregnancy  If you are about  to stop having your period (premenopausal) and you may become pregnant, seek counseling before you get pregnant.  Take 400 to 800 micrograms (mcg) of folic acid every day if you become pregnant.  Ask for birth control (contraception) if you want to prevent pregnancy. Osteoporosis and menopause Osteoporosis is a disease in which the bones lose minerals and strength with aging. This can result in bone fractures. If you are 65 years old or older, or if you are at risk for osteoporosis and fractures, ask your health care provider if you should:  Be screened for bone loss.  Take a calcium or vitamin D supplement to lower your risk of fractures.  Be given hormone replacement therapy (HRT) to treat symptoms of menopause. Follow these instructions at home: Lifestyle  Do not use any products that contain nicotine or tobacco, such as cigarettes, e-cigarettes, and chewing tobacco. If you need help quitting, ask your health care provider.  Do not use street drugs.  Do not share needles.  Ask your health care provider for help if you need support or information about quitting drugs. Alcohol use  Do not drink alcohol if: ? Your health care provider tells you not to drink. ? You are pregnant, may be pregnant, or are planning to become pregnant.  If you drink alcohol: ? Limit how much you use to 0-1 drink a day. ? Limit intake if you are breastfeeding.  Be aware of how much alcohol is in your drink. In the [place] one drink equals one 12 oz bottle of beer (355 mL), one 5 oz glass of wine (148 mL), or one 1 oz glass of hard liquor (44 mL). General instructions  Schedule regular health, dental, and eye exams.  Stay current with your vaccines.  Tell your health care provider if: ? You often feel depressed. ? You have ever been abused or do not feel safe at home. Summary  Adopting a healthy lifestyle and getting preventive care are important in promoting health and wellness.  Follow your  health care provider's instructions about healthy diet, exercising, and getting tested or screened for diseases.  Follow your health care provider's instructions on monitoring your cholesterol and blood pressure. This information is not intended to replace advice given to you by your health care provider. Make sure you discuss any questions you have with your health care provider. Document Released: 10/22/2010 Document Revised: 04/01/2018 Document Reviewed: 04/01/2018 Elsevier Patient Education  2020 [employer]  

## 2019-02-09 NOTE — Assessment & Plan Note (Signed)
Working on diabetes control and in the last year she has gone from HgA1c 11.2 to today 8.5 and she is continuing to make progress. BP at goal today. On ASA and brilinta currently.

## 2019-02-09 NOTE — Assessment & Plan Note (Signed)
Flu shot does not get. Pneumonia up to date. Tetanus up to date. Mammogram up to date, pap smear not indicated. Counseled about sun safety and mole surveillance. Counseled about the dangers of distracted driving. Given 10 year screening recommendations.

## 2019-02-09 NOTE — Assessment & Plan Note (Signed)
Taking crestor 40 mg daily and most recent lipid panel at goal.

## 2019-02-09 NOTE — Assessment & Plan Note (Signed)
Weight is stable and overall decreasing in the last year.

## 2019-02-09 NOTE — Assessment & Plan Note (Signed)
HgA1c done in the office which is slightly worse but she is making lifestyle changes. Monitoring sugars and we will keep rybelsus at this time. Refilled today. Foot exam done and reminded about yearly eye exam. On statin and ARB.

## 2019-02-11 ENCOUNTER — Other Ambulatory Visit: Payer: Self-pay | Admitting: Adult Health

## 2019-02-12 ENCOUNTER — Other Ambulatory Visit: Payer: Self-pay | Admitting: Internal Medicine

## 2019-02-12 MED ORDER — ROSUVASTATIN CALCIUM 40 MG PO TABS: 40.0000 mg | ORAL_TABLET | Freq: Every day | ORAL | 3 refills | Status: DC

## 2019-03-01 ENCOUNTER — Encounter: Payer: Self-pay | Admitting: Internal Medicine

## 2019-03-01 ENCOUNTER — Inpatient Hospital Stay (HOSPITAL_COMMUNITY): Admission: RE | Admit: 2019-03-01 | Payer: Self-pay | Source: Ambulatory Visit

## 2019-03-01 ENCOUNTER — Telehealth: Payer: BC Managed Care – PPO | Admitting: Physician Assistant

## 2019-03-01 ENCOUNTER — Ambulatory Visit: Payer: Self-pay

## 2019-03-01 DIAGNOSIS — J309 Allergic rhinitis, unspecified: Secondary | ICD-10-CM

## 2019-03-01 MED ORDER — FLUTICASONE PROPIONATE 50 MCG/ACT NA SUSP: 2.0000 | Freq: Every day | NASAL | 6 refills | Status: DC

## 2019-03-01 MED ORDER — MONTELUKAST SODIUM 10 MG PO TABS: 10.0000 mg | ORAL_TABLET | Freq: Every day | ORAL | 3 refills | Status: DC

## 2019-03-01 NOTE — Telephone Encounter (Signed)
Patient called stating that she has had allergic reaction to lysol spray.  She has a hx of allergic reactions to scents. She has no fever She has nasal congestion and scratchy throat. She has had a E-visit today.  Care advice and  call transferred to office for scheduling.   Reason for Disposition . [1] Mild facial swelling (puffiness) AND [2] persists > 3 days  Answer Assessment - Initial Assessment Questions 1. ONSET: "When did the swelling start?" (e.g., minutes, hours, days)     This AM when getting out of bed 2. LOCATION: "What part of the face is swollen?"     facia nose and under eyes 3. SEVERITY: "How swollen is it?"    Less than this AM 4. ITCHING: "Is there any itching?" If so, ask: "How much?"   (Scale 1-10; mild, moderate or severe)    All over hot feeling  5. PAIN: "Is the swelling painful to touch?" If so, ask: "How painful is it?"   (Scale 1-10; mild, moderate or severe)     no 6. FEVER: "Do you have a fever?" If so, ask: "What is it, how was it measured, and when did it start?"    none 7. CAUSE: "What do you think is causing the face swelling?"    Spray yesterday 8. RECURRENT SYMPTOM: "Have you had face swelling before?" If so, ask: "When was the last time?" "What happened that time?"     I have allergies to sents lysol 9. OTHER SYMPTOMS: "Do you have any other symptoms?" (e.g., toothache, leg swelling)     No scratchy throat 10. PREGNANCY: "Is there any chance you are pregnant?" "When was your last menstrual period?"       No  No periods  Protocols used: [facility]

## 2019-03-01 NOTE — Telephone Encounter (Signed)
fyi  Doxy with [name] tomorrow to discuss

## 2019-03-01 NOTE — Telephone Encounter (Signed)
Patient informed of MD response and stated understanding  

## 2019-03-01 NOTE — Progress Notes (Signed)
E visit for Allergic Rhinitis We are sorry that you are not feeling well.  Here is how we plan to help!  If your allergies are difficult for you to control, please contact your primary care provider regarding testing/treatment options.   Based on what you have shared with me it looks like you have Allergic Rhinitis.  Rhinitis is when a reaction occurs that causes nasal congestion, runny nose, sneezing, and itching.  Most types of rhinitis are caused by an inflammation and are associated with symptoms in the eyes ears or throat. There are several types of rhinitis.  The most common are acute rhinitis, which is usually caused by a viral illness, allergic or seasonal rhinitis, and nonallergic or year-round rhinitis.  Nasal allergies occur certain times of the year.  Allergic rhinitis is caused when allergens in the air trigger the release of histamine in the body.  Histamine causes itching, swelling, and fluid to build up in the fragile linings of the nasal passages, sinuses and eyelids.  An itchy nose and clear discharge are common.  I recommend the following over the counter treatments: Xyzal 5 mg take 1 tablet daily  I also would recommend a nasal spray: Flonase 2 sprays into each nostril once daily and Saline 1 spray into each nostril as needed  You may also benefit from eye drops such as: Systane 1-2 driops each eye twice daily as needed  HOME CARE:   You can use an over-the-counter saline nasal spray as needed  Avoid areas where there is heavy dust, mites, or molds  Stay indoors on windy days during the pollen season  Keep windows closed in home, at least in bedroom; use air conditioner.  Use high-efficiency house air filter  Keep windows closed in car, turn AC on re-circulate  Avoid playing out with dog during pollen season  GET HELP RIGHT AWAY IF:   If your symptoms do not improve within 10 days  You become short of breath  You develop yellow or green discharge from your  nose for over 3 days  You have coughing fits  MAKE SURE YOU:   Understand these instructions  Will watch your condition  Will get help right away if you are not doing well or get worse  Thank you for choosing an e-visit. Your e-visit answers were reviewed by a board certified advanced clinical practitioner to complete your personal care plan. Depending upon the condition, your plan could have included both over the counter or prescription medications. Please review your pharmacy choice. Be sure that the pharmacy you have chosen is open so that you can pick up your prescription now.  If there is a problem you may message your provider in [place] to have the prescription routed to another pharmacy. Your safety is important to [name]. If you have drug allergies check your prescription carefully.  For the next 24 hours, you can use MyChart to ask questions about today's visit, request a non-urgent call back, or ask for a work or school excuse from your e-visit provider. You will get an email in the next two days asking about your experience. I hope that your e-visit has been valuable and will speed your recovery.   Greater than 5 minutes, yet less than 10 minutes of time have been spent researching, coordinating and implementing care for this patient today.

## 2019-03-01 NOTE — Telephone Encounter (Signed)
Can take benadryl for swelling if not advised.

## 2019-03-02 ENCOUNTER — Ambulatory Visit (INDEPENDENT_AMBULATORY_CARE_PROVIDER_SITE_OTHER): Payer: BC Managed Care – PPO | Admitting: Internal Medicine

## 2019-03-02 ENCOUNTER — Encounter: Payer: Self-pay | Admitting: Internal Medicine

## 2019-03-02 DIAGNOSIS — T7840XA Allergy, unspecified, initial encounter: Secondary | ICD-10-CM | POA: Insufficient documentation

## 2019-03-02 DIAGNOSIS — R220 Localized swelling, mass and lump, head: Secondary | ICD-10-CM

## 2019-03-02 MED ORDER — EPINEPHRINE 0.3 MG/0.3ML IJ SOAJ: 0.3000 mg | INTRAMUSCULAR | 1 refills | Status: AC | PRN

## 2019-03-02 MED ORDER — PREDNISONE 20 MG PO TABS: 40.0000 mg | ORAL_TABLET | Freq: Every day | ORAL | 0 refills | Status: DC

## 2019-03-02 NOTE — Progress Notes (Signed)
Virtual Visit via Video Note  I connected with Debra [name] on 03/02/19 at 11:00 AM EST by a video enabled telemedicine application and verified that I am speaking with the correct person using two identifiers.  The patient and the provider were at separate locations throughout the entire encounter.   I discussed the limitations of evaluation and management by telemedicine and the availability of in person appointments. The patient expressed understanding and agreed to proceed. The patient and the provider were the only parties present for the visit unless noted in HPI below.  History of Present Illness: The patient is a 43 y.o. female with visit for facial swelling due to allergic reaction. Started about 2 days ago. Has some swelling in the bottom lip and around nose and eyes. Denies SOB or throat closing. Some sensation of thickness in throat. This is overall improving. She has taken benadryl last night which helped. Taking her usual allergy medications and started montelukast yesterday. Denies rash on the skin. No tongue swelling. Reaction was to a lysol spray someone did at work.   Observations/Objective: Appearance: normal, face with swelling around the nose and lower lip, tongue normal, breathing appears normal without stridor or dyspnea, casual grooming, abdomen does not appear distended, throat normal, memory normal, mental status is A and O times 3  Assessment and Plan: See problem oriented charting  Follow Up Instructions: rx prednisone, epi-pen  I discussed the assessment and treatment plan with the patient. The patient was provided an opportunity to ask questions and all were answered. The patient agreed with the plan and demonstrated an understanding of the instructions.   The patient was advised to call back or seek an in-person evaluation if the symptoms worsen or if the condition fails to improve as anticipated.  Hoyt Koch, MD

## 2019-03-02 NOTE — Assessment & Plan Note (Signed)
Rx prednisone, not anaphylactic. Rx epi-pen to use if this happens again. Avoid triggers. Keep taking her usual allergy medications. Call for worsening or seek care.

## 2019-03-08 ENCOUNTER — Other Ambulatory Visit (HOSPITAL_COMMUNITY)
Admission: RE | Admit: 2019-03-08 | Discharge: 2019-03-08 | Disposition: A | Discharge status: Home / Self Care | Payer: BC Managed Care – PPO | Source: Ambulatory Visit | Attending: Neurology | Admitting: Neurology

## 2019-03-08 DIAGNOSIS — Z01812 Encounter for preprocedural laboratory examination: Secondary | ICD-10-CM | POA: Insufficient documentation

## 2019-03-08 DIAGNOSIS — Z20828 Contact with and (suspected) exposure to other viral communicable diseases: Secondary | ICD-10-CM | POA: Insufficient documentation

## 2019-03-10 ENCOUNTER — Ambulatory Visit (HOSPITAL_COMMUNITY)
Admission: RE | Admit: 2019-03-10 | Discharge: 2019-03-10 | Disposition: A | Discharge status: Home / Self Care | Payer: BC Managed Care – PPO | Source: Ambulatory Visit | Attending: Interventional Radiology | Admitting: Interventional Radiology

## 2019-03-10 ENCOUNTER — Other Ambulatory Visit: Payer: Self-pay

## 2019-03-10 DIAGNOSIS — I771 Stricture of artery: Secondary | ICD-10-CM | POA: Insufficient documentation

## 2019-03-10 LAB — NOVEL CORONAVIRUS, NAA (HOSP ORDER, SEND-OUT TO REF LAB; TAT 18-24 HRS): SARS-CoV-2, NAA: NOT DETECTED

## 2019-03-10 MED ORDER — IOHEXOL 350 MG/ML SOLN
100.0000 mL | Freq: Once | INTRAVENOUS | Status: AC | PRN
  Administered 2019-03-10: 11:00:00 100 mL via INTRAVENOUS

## 2019-03-11 ENCOUNTER — Ambulatory Visit (INDEPENDENT_AMBULATORY_CARE_PROVIDER_SITE_OTHER): Payer: BC Managed Care – PPO | Admitting: Neurology

## 2019-03-11 DIAGNOSIS — G4733 Obstructive sleep apnea (adult) (pediatric): Secondary | ICD-10-CM

## 2019-03-11 DIAGNOSIS — E1165 Type 2 diabetes mellitus with hyperglycemia: Secondary | ICD-10-CM

## 2019-03-11 DIAGNOSIS — F5104 Psychophysiologic insomnia: Secondary | ICD-10-CM

## 2019-03-11 DIAGNOSIS — R0683 Snoring: Secondary | ICD-10-CM

## 2019-03-11 DIAGNOSIS — I63512 Cerebral infarction due to unspecified occlusion or stenosis of left middle cerebral artery: Secondary | ICD-10-CM

## 2019-03-11 DIAGNOSIS — N951 Menopausal and female climacteric states: Secondary | ICD-10-CM

## 2019-03-17 ENCOUNTER — Telehealth: Payer: Self-pay | Admitting: *Deleted

## 2019-03-17 ENCOUNTER — Encounter: Payer: Self-pay | Admitting: *Deleted

## 2019-03-17 NOTE — Addendum Note (Signed)
Addended by: Larey Seat on: 03/17/2019 01:47 PM   Modules accepted: Orders

## 2019-03-17 NOTE — Procedures (Signed)
PATIENT'S NAME:  Debra [name], Debra [name] DOB:      [birth year]      MR#:    [id number]     DATE OF RECORDING: 03/11/2019  Debra [name] REFERRING M.D.:  Debra [name] ,MD Study Performed:   Titration to positive airway pressure  HISTORY:  Debra [name] is a 43 -year-old African-American Female with a BMI of 50 kg/m2, having recently suffered a stroke. She is referred by Debra [name] with the Stroke Team to evaluate for OSA. Also has a history of Asthma, Diabetes, HTN , morbid obesity, surgical menopause after Endometriosis, Hot flushes in sleep, Chronic Insomnia. The patient returned after her Polysomnography from 12-23-2018- these were the results; Loud, primary snoring with mild OSA, frequent arousals form sleep. The total APNEA/HYPOPNEA INDEX (AHI) was 5.5/hour. The REM AHI was 24 /hour, versus a non-REM AHI of 4.7. The patient spent 145.5 minutes of total sleep time in the supine position and 94 minutes in non-supine. The supine AHI was 9.1 versus a non-supine AHI of 0.0. SpO2 saturation was 96%, with the lowest being 91%.  The patient endorsed the Epworth Sleepiness Scale at 1 point.   The patient's weight 331 pounds with a height of 68 (inches), resulting in a BMI of 50.1 kg/m2. The patient's neck circumference measured 16 inches.  CURRENT MEDICATIONS: Albuterol, Norvasc, Aspirin, Allegra, Flonase, Cozaar, Pantanal, Crestor, [place].  PROCEDURE:  This is a multichannel digital polysomnogram utilizing the SomnoStar 11.2 system.  Electrodes and sensors were applied and monitored per AASM Specifications.   EEG, EOG, Chin and Limb EMG, were sampled at 200 Hz.  ECG, Snore and Nasal Pressure, Thermal Airflow, Respiratory Effort, CPAP Flow and Pressure, Oximetry was sampled at 50 Hz. Digital video and audio were recorded.      CPAP was initiated at 5 cmH20 with heated humidity per AASM split night standards and pressure was advanced to 11 cmH20 because of hypopneas, apneas and desaturations.  At a PAP pressure of  11 cmH20, there was a reduction of the AHI to 0.0/h with improvement of sleep apnea. Lights Out was at 21:13 and Lights On at 04:33. Total recording time (TRT) was 440.5 minutes, with a total sleep time (TST) of 362.5 minutes. The patient's sleep latency was 27.5 minutes. REM latency was 131.5 minutes.  The sleep efficiency was 82.3 %.    SLEEP ARCHITECTURE: WASO (Wake after sleep onset)  was 60.5 minutes.  There were 31 minutes in Stage N1, 215 minutes Stage N2, 15.5 minutes Stage N3 and 101 minutes in Stage REM.  The percentage of Stage N1 was 8.6%, Stage N2 was 59.3%, Stage N3 was 4.3% and Stage R (REM sleep) was 27.9%. The sleep architecture was notable for multiple REM cycles.  RESPIRATORY ANALYSIS:  There was a total of 1 respiratory event: 0 apneas and 1 hypopnea. The total APNEA/HYPOPNEA INDEX  (AHI) was 0.2 /hour and the total RESPIRATORY DISTURBANCE INDEX was 0.2 /hour  0 events occurred in REM sleep and 1 events in NREM. The REM AHI was 0 /hour versus a non-REM AHI of 0.2 /hour.  The patient spent 23 minutes of total sleep time in the supine position and 340 minutes in non-supine. The supine AHI was 0.0, versus a non-supine AHI of 0.2.  OXYGEN SATURATION & C02:  The baseline 02 saturation was 96%, with the lowest being 93%. Time spent below 89% saturation equaled 0 minutes.  The arousals were noted as: 23 were spontaneous, 0 were associated with PLMs, 0 were associated with  respiratory events. The patient had a total of 0 Periodic Limb Movements. Audio and video analysis did not show any abnormal or unusual movements, behaviors, phonations or vocalizations.   Snoring was controlled. EKG was in keeping with normal sinus rhythm (NSR). The patient was fitted with a DreamWear FFM in medium size mask.  DIAGNOSIS 1. Obstructive Sleep Apnea was completely controlled using a DreamWear FFM in medium size and CPAP at 11 cm water.  PLANS/RECOMMENDATIONS: 1. All apnea patients should avoid sedatives,  hypnotics, and alcoholic beverage consumption before bedtime, as these affect the AHI.  2. CPAP therapy compliance is defined as 4 hours or more of nightly use.   A follow up appointment will be scheduled in the Sleep Clinic at [facility].   Please call [phone] with any questions.      I certify that I have reviewed the entire raw data recording prior to the issuance of this report in accordance with the Standards of Accreditation of the American Academy of Sleep Medicine (AASM)  Larey Seat, M.D. Diplomat, Tax adviser of Psychiatry and Neurology  Diplomat, Tax adviser of Sleep Medicine Market researcher, [employer] [facility] at [employer]

## 2019-03-17 NOTE — Telephone Encounter (Signed)
Spoke with patient and discussed sleep study results. Patient understands she did well on CPAP, OSA well controlled. She was advised of the things to avoid before bedtime as listed below by Dr. Brett Fairy. The patient understands the DME company will be calling her to set her up with the CPAP. She was advised to try to use this at least 4 hours every night for compliance. Pt aware an appt is needed for initial CPAP follow-up. I scheduled her with Amy NP on Tues 05/24/2018 @ 3:30 pm arrival 15 minutes prior. She understands to bring her CPAP machine to the initial visit. The patient verbalized understanding and appreciation for the results. She understands we will send a letter with this summary to her home. Confirmed address on file is correct. Her questions were answered. She understands per Dr. Edwena Felty note, ok to use melatonin at night if needed, no more than 5 mg.   Letter addressed to pt and placed with outgoing mail.

## 2019-03-17 NOTE — Telephone Encounter (Signed)
-----   Message from Larey Seat, MD sent at 03/17/2019  1:46 PM EST ----- DIAGNOSIS  1. Obstructive Sleep Apnea was completely controlled using a  DreamWear FFM in medium size and CPAP at 11 cm water.  PLANS/RECOMMENDATIONS:  1. All apnea patients should avoid sedatives, hypnotics, and  alcoholic beverage consumption before bedtime, as these affect  the AHI.  2. CPAP therapy compliance is defined as 4 hours or more of  nightly use.

## 2019-03-22 ENCOUNTER — Telehealth (HOSPITAL_COMMUNITY): Payer: Self-pay

## 2019-03-22 NOTE — Telephone Encounter (Signed)
Pt agreed to f/u in 3 months with angiogram. She is not having any sx at this time. AW

## 2019-04-14 ENCOUNTER — Ambulatory Visit (INDEPENDENT_AMBULATORY_CARE_PROVIDER_SITE_OTHER): Payer: BC Managed Care – PPO | Admitting: Internal Medicine

## 2019-04-14 ENCOUNTER — Other Ambulatory Visit: Payer: Self-pay

## 2019-04-14 ENCOUNTER — Encounter: Payer: Self-pay | Admitting: Internal Medicine

## 2019-04-14 VITALS — BP 142/94 | HR 66 | Temp 98.0°F | Ht 68.0 in | Wt 330.0 lb

## 2019-04-14 DIAGNOSIS — I10 Essential (primary) hypertension: Secondary | ICD-10-CM | POA: Diagnosis not present

## 2019-04-14 DIAGNOSIS — R42 Dizziness and giddiness: Secondary | ICD-10-CM | POA: Insufficient documentation

## 2019-04-14 DIAGNOSIS — E1165 Type 2 diabetes mellitus with hyperglycemia: Secondary | ICD-10-CM | POA: Diagnosis not present

## 2019-04-14 LAB — COMPREHENSIVE METABOLIC PANEL
ALT: 18 U/L (ref 0–35)
AST: 13 U/L (ref 0–37)
Albumin: 3.7 g/dL (ref 3.5–5.2)
Alkaline Phosphatase: 99 U/L (ref 39–117)
BUN: 9 mg/dL (ref 6–23)
CO2: 27 mEq/L (ref 19–32)
Calcium: 9 mg/dL (ref 8.4–10.5)
Chloride: 105 mEq/L (ref 96–112)
Creatinine, Ser: 0.81 mg/dL (ref 0.40–1.20)
GFR: 92.53 mL/min (ref >60.00)
Glucose, Bld: 222 mg/dL — ABNORMAL HIGH (ref 70–99)
Potassium: 4.4 mEq/L (ref 3.5–5.1)
Sodium: 141 mEq/L (ref 135–145)
Total Bilirubin: 0.3 mg/dL (ref 0.2–1.2)
Total Protein: 6 g/dL (ref 6.0–8.3)

## 2019-04-14 NOTE — Assessment & Plan Note (Signed)
Checking fructosamine but suspect not as tight control with steroids last month. Will decrease rybelsus dosing to 7 mg daily to help with loss of appetite which is causing her to not be able to take her medications daily.

## 2019-04-14 NOTE — Assessment & Plan Note (Signed)
Suspect could be low sugar with rybelsus since she is not eating well due to poor appetite. Asked her to half dose to 7 mg daily rybelsus and let us know if this solves the problem. BP is running mildly high at home but should not be causing symptoms.

## 2019-04-14 NOTE — Assessment & Plan Note (Signed)
Taking amlodipine and losartan but compliance is poor lately due to side effects likely of her diabetes medicine. Will have her start taking amlodipine and losartan regularly and can take in evening instead if she needs to not take meds all at once. Checking CMP and adjust as needed. BP mildly high here and consistent with home readings.

## 2019-04-14 NOTE — Patient Instructions (Signed)
We will check the labs today and will have you cut the rybelsus in half to see if this is the problem.

## 2019-04-14 NOTE — Progress Notes (Signed)
   Subjective:   Patient ID: Debra [name], female    DOB: [birth year], 43 y.o.   MRN: [id number]  HPI The patient is a 43 YO female coming in for follow up diabetes (taking rybelsus, has no appetite and has to force herself to eat and schedule this, some lightheaded feeling after eating and taking meds in the morning, if she skips this in the morning and eats around lunchtime this does not happen, denies having low sugars, does not check sugars when this is happening, denies weight loss or change, recent steroids last month for allergic reaction, no recurrence of allergic reaction since that time) and blood pressure (BP at home running mildly high in the 140s/80s, denies headaches or lightheadedness, denies chest pains or SOB, taking her amlodipine and losartan but spotty lately due to this new lightheadedness she is having in the morning so not taking meds consistently in the last few weeks) and having new lightheadedness (happens after she takes her medications and eats in the morning, if she misses breakfast and does not take meds until later in the day when eating she does not get this, has not checked sugar during episode although she has a meter, taking rybelsus for sugars, no recent change to BP medications, has BP cuff and levels are staying normal to mildly high at home, denies lightheadedness with standing, feels with the rybelsus she is not eating and has no appetite and has to force herself to eat).   Review of Systems  Constitutional: Positive for appetite change.  HENT: Negative.   Eyes: Negative.   Respiratory: Negative for cough, chest tightness and shortness of breath.   Cardiovascular: Negative for chest pain, palpitations and leg swelling.  Gastrointestinal: Negative for abdominal distention, abdominal pain, constipation, diarrhea, nausea and vomiting.  Musculoskeletal: Negative.   Skin: Negative.   Neurological: Positive for dizziness and light-headedness.    Psychiatric/Behavioral: Negative.     Objective:  Physical Exam Constitutional:      Appearance: She is well-developed. She is obese.  HENT:     Head: Normocephalic and atraumatic.  Cardiovascular:     Rate and Rhythm: Normal rate and regular rhythm.  Pulmonary:     Effort: Pulmonary effort is normal. No respiratory distress.     Breath sounds: Normal breath sounds. No wheezing or rales.  Abdominal:     General: Bowel sounds are normal. There is no distension.     Palpations: Abdomen is soft.     Tenderness: There is no abdominal tenderness. There is no rebound.  Musculoskeletal:     Cervical back: Normal range of motion.  Skin:    General: Skin is warm and dry.  Neurological:     Mental Status: She is alert and oriented to person, place, and time.     Coordination: Coordination normal.     Vitals:   04/14/19 1107  BP: (!) 142/94  Pulse: 66  Temp: 98 F (36.7 C)  TempSrc: Oral  SpO2: 99%  Weight: (!) 330 lb (149.7 kg)  Height: 5\' 8"  (1.727 m)    This visit occurred during the SARS-CoV-2 public health emergency.  Safety protocols were in place, including screening questions prior to the visit, additional usage of staff PPE, and extensive cleaning of exam room while observing appropriate contact time as indicated for disinfecting solutions.   Assessment & Plan:

## 2019-04-16 ENCOUNTER — Emergency Department (HOSPITAL_COMMUNITY): Payer: BC Managed Care – PPO

## 2019-04-16 ENCOUNTER — Emergency Department (HOSPITAL_COMMUNITY)
Admission: EM | Admit: 2019-04-16 | Discharge: 2019-04-16 | Disposition: A | Discharge status: Home / Self Care | Payer: BC Managed Care – PPO | Attending: Emergency Medicine | Admitting: Emergency Medicine

## 2019-04-16 ENCOUNTER — Other Ambulatory Visit: Payer: Self-pay

## 2019-04-16 DIAGNOSIS — Z8673 Personal history of transient ischemic attack (TIA), and cerebral infarction without residual deficits: Secondary | ICD-10-CM | POA: Diagnosis not present

## 2019-04-16 DIAGNOSIS — M25562 Pain in left knee: Secondary | ICD-10-CM | POA: Diagnosis present

## 2019-04-16 DIAGNOSIS — Z7982 Long term (current) use of aspirin: Secondary | ICD-10-CM | POA: Insufficient documentation

## 2019-04-16 DIAGNOSIS — W2210XA Striking against or struck by unspecified automobile airbag, initial encounter: Secondary | ICD-10-CM | POA: Diagnosis not present

## 2019-04-16 DIAGNOSIS — M79641 Pain in right hand: Secondary | ICD-10-CM | POA: Insufficient documentation

## 2019-04-16 DIAGNOSIS — Y998 Other external cause status: Secondary | ICD-10-CM | POA: Insufficient documentation

## 2019-04-16 DIAGNOSIS — I10 Essential (primary) hypertension: Secondary | ICD-10-CM | POA: Insufficient documentation

## 2019-04-16 DIAGNOSIS — Y9389 Activity, other specified: Secondary | ICD-10-CM | POA: Insufficient documentation

## 2019-04-16 DIAGNOSIS — R0789 Other chest pain: Secondary | ICD-10-CM | POA: Diagnosis not present

## 2019-04-16 DIAGNOSIS — E119 Type 2 diabetes mellitus without complications: Secondary | ICD-10-CM | POA: Insufficient documentation

## 2019-04-16 DIAGNOSIS — Y92410 Unspecified street and highway as the place of occurrence of the external cause: Secondary | ICD-10-CM | POA: Insufficient documentation

## 2019-04-16 DIAGNOSIS — Z79899 Other long term (current) drug therapy: Secondary | ICD-10-CM | POA: Insufficient documentation

## 2019-04-16 DIAGNOSIS — J45909 Unspecified asthma, uncomplicated: Secondary | ICD-10-CM | POA: Insufficient documentation

## 2019-04-16 LAB — CBC WITH DIFFERENTIAL/PLATELET
Abs Immature Granulocytes: 0.03 10*3/uL (ref 0.00–0.07)
Basophils Absolute: 0.1 10*3/uL (ref 0.0–0.1)
Basophils Relative: 0 %
Eosinophils Absolute: 0.3 10*3/uL (ref 0.0–0.5)
Eosinophils Relative: 3 %
HCT: 45.9 % (ref 36.0–46.0)
Hemoglobin: 14.1 g/dL (ref 12.0–15.0)
Immature Granulocytes: 0 %
Lymphocytes Relative: 20 %
Lymphs Abs: 2.2 10*3/uL (ref 0.7–4.0)
MCH: 25.4 pg — ABNORMAL LOW (ref 26.0–34.0)
MCHC: 30.7 g/dL (ref 30.0–36.0)
MCV: 82.7 fL (ref 80.0–100.0)
Monocytes Absolute: 0.5 10*3/uL (ref 0.1–1.0)
Monocytes Relative: 4 %
Neutro Abs: 8.2 10*3/uL — ABNORMAL HIGH (ref 1.7–7.7)
Neutrophils Relative %: 73 %
Platelets: 331 10*3/uL (ref 150–400)
RBC: 5.55 MIL/uL — ABNORMAL HIGH (ref 3.87–5.11)
RDW: 13.1 % (ref 11.5–15.5)
WBC: 11.2 10*3/uL — ABNORMAL HIGH (ref 4.0–10.5)
nRBC: 0 % (ref 0.0–0.2)

## 2019-04-16 LAB — BASIC METABOLIC PANEL
Anion gap: 10 (ref 5–15)
BUN: 8 mg/dL (ref 6–20)
CO2: 24 mmol/L (ref 22–32)
Calcium: 9.2 mg/dL (ref 8.9–10.3)
Chloride: 104 mmol/L (ref 98–111)
Creatinine, Ser: 0.8 mg/dL (ref 0.44–1.00)
GFR calc Af Amer: >60 mL/min (ref >60)
GFR calc non Af Amer: >60 mL/min (ref >60)
Glucose, Bld: 189 mg/dL — ABNORMAL HIGH (ref 70–99)
Potassium: 3.6 mmol/L (ref 3.5–5.1)
Sodium: 138 mmol/L (ref 135–145)

## 2019-04-16 LAB — PROTIME-INR
INR: 1 (ref 0.8–1.2)
Prothrombin Time: 12.6 seconds (ref 11.4–15.2)

## 2019-04-16 LAB — CBG MONITORING, ED: Glucose-Capillary: 179 mg/dL — ABNORMAL HIGH (ref 70–99)

## 2019-04-16 MED ORDER — CYCLOBENZAPRINE HCL 10 MG PO TABS: 10.0000 mg | ORAL_TABLET | Freq: Two times a day (BID) | ORAL | 0 refills | Status: DC | PRN

## 2019-04-16 MED ORDER — IBUPROFEN 800 MG PO TABS: 800.0000 mg | ORAL_TABLET | Freq: Three times a day (TID) | ORAL | 0 refills | Status: DC
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
## 2019-04-16 NOTE — ED Notes (Signed)
Discharge instructions and prescriptions discussed with Pt. Pt verbalized understanding. Pt stable and ambulatory.   

## 2019-04-22 LAB — FRUCTOSAMINE: Fructosamine: 282 umol/L (ref 205–285)

## 2019-05-11 ENCOUNTER — Telehealth: Payer: Self-pay

## 2019-05-11 ENCOUNTER — Ambulatory Visit: Payer: BC Managed Care – PPO | Admitting: Internal Medicine

## 2019-05-11 NOTE — Telephone Encounter (Signed)
Copied from CRM [id number]. Topic: General - Other >> May 11, 2019 12:42 PM Debra [name] wrote: Reason for CRM:Pt was seen at ER on 04/15/20 and had x ray of her right hand.She is still having swelling and has developed a knot on pointer finger.She would like to know if she needs to be seen by your office or does she need referral to specialist   F/u  Appt with Ria Clock on  05/12/19 @ 11:20am

## 2019-05-12 ENCOUNTER — Ambulatory Visit: Payer: Medicaid Other | Admitting: Obstetrics and Gynecology

## 2019-05-12 ENCOUNTER — Other Ambulatory Visit: Payer: Self-pay

## 2019-05-12 ENCOUNTER — Ambulatory Visit (INDEPENDENT_AMBULATORY_CARE_PROVIDER_SITE_OTHER): Payer: Medicaid Other

## 2019-05-12 ENCOUNTER — Encounter: Payer: Self-pay | Admitting: Family

## 2019-05-12 ENCOUNTER — Ambulatory Visit (INDEPENDENT_AMBULATORY_CARE_PROVIDER_SITE_OTHER): Payer: Self-pay | Admitting: Family

## 2019-05-12 VITALS — BP 132/84 | HR 78 | Temp 97.7°F | Ht 68.0 in | Wt 328.8 lb

## 2019-05-12 DIAGNOSIS — M79641 Pain in right hand: Secondary | ICD-10-CM
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
## 2019-05-25 ENCOUNTER — Ambulatory Visit: Payer: Self-pay | Admitting: Family Medicine

## 2019-06-01 ENCOUNTER — Encounter: Payer: Self-pay | Admitting: Internal Medicine

## 2019-06-03 ENCOUNTER — Encounter: Payer: Self-pay | Admitting: Internal Medicine

## 2019-06-03 ENCOUNTER — Ambulatory Visit (INDEPENDENT_AMBULATORY_CARE_PROVIDER_SITE_OTHER): Payer: Self-pay | Admitting: Family Medicine

## 2019-06-03 ENCOUNTER — Encounter: Payer: Self-pay | Admitting: Family Medicine

## 2019-06-03 ENCOUNTER — Ambulatory Visit: Payer: Self-pay

## 2019-06-03 ENCOUNTER — Other Ambulatory Visit: Payer: Self-pay

## 2019-06-03 VITALS — BP 120/76 | HR 66 | Ht 68.0 in | Wt 329.4 lb

## 2019-06-03 DIAGNOSIS — M79644 Pain in right finger(s): Secondary | ICD-10-CM
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
## 2019-06-03 NOTE — Patient Instructions (Signed)
Thank you for coming in today. Try heat.  Try using modeling clay to pinch or pull or push Recheck in 4 -6 weeks.  Return or contact me sooner if not doing well.   I think the problem is traumatic synovitis.

## 2019-06-08 ENCOUNTER — Telehealth: Payer: Self-pay

## 2019-06-08 DIAGNOSIS — I10 Essential (primary) hypertension: Secondary | ICD-10-CM

## 2019-06-08 MED ORDER — AMLODIPINE BESYLATE 2.5 MG PO TABS: 7.5000 mg | ORAL_TABLET | Freq: Every day | ORAL | 3 refills | Status: DC

## 2019-06-08 MED ORDER — ROSUVASTATIN CALCIUM 40 MG PO TABS: 40.0000 mg | ORAL_TABLET | Freq: Every day | ORAL | 3 refills | Status: DC

## 2019-06-08 MED ORDER — TICAGRELOR 90 MG PO TABS: 45.0000 mg | ORAL_TABLET | Freq: Two times a day (BID) | ORAL | 0 refills | Status: DC

## 2019-06-08 MED ORDER — LOSARTAN POTASSIUM 100 MG PO TABS: ORAL_TABLET | ORAL | 3 refills | Status: DC

## 2019-06-08 NOTE — Telephone Encounter (Signed)
New message     1. Which medications need to be refilled? (please list name of each medication and dose if known)   ticagrelor (BRILINTA) 90 MG TABS tablet  amLODipine (NORVASC) 2.5 MG tablet  rosuvastatin (CRESTOR) 40 MG tablet  losartan (COZAAR) 100 MG tablet  2. Which pharmacy/location (including street and city if local pharmacy) is medication to be sent to? [facility] in [place]   3. Do they need a 30 day or 90 day supply? 30 day supply

## 2019-06-09 ENCOUNTER — Telehealth: Payer: Self-pay | Admitting: Internal Medicine

## 2019-06-09 NOTE — Telephone Encounter (Signed)
Appointment scheduled for 06/11/19    [facility] NOTE: Chief Complaint Blood Sugar Low  Nurse Assessment Nurse: Daphine Deutscher, RN, Melanie Date/Time Debra [name] Time): 06/08/2019 12:23:44 PM  Date/Time Debra [name] Time): 06/08/2019 12:23:44 PM Confirm and document reason for call. If symptomatic, describe symptoms. ---Caller states her BS was 215 this morning and was 274 on Thursday. Usually in 140s and 150s. Takes rubelis 14 mg and made her feel a little sick , went back to glipizide 5 mg and was supposed to take 1 AM and PM but instead she took 2 AM and PM. Caller states she has blurry vision.

## 2019-06-11 ENCOUNTER — Ambulatory Visit (INDEPENDENT_AMBULATORY_CARE_PROVIDER_SITE_OTHER): Payer: Medicaid Other | Admitting: Internal Medicine

## 2019-06-11 ENCOUNTER — Encounter: Payer: Self-pay | Admitting: Internal Medicine

## 2019-06-11 ENCOUNTER — Other Ambulatory Visit: Payer: Self-pay

## 2019-06-11 DIAGNOSIS — E1165 Type 2 diabetes mellitus with hyperglycemia: Secondary | ICD-10-CM

## 2019-06-11 MED ORDER — GLIPIZIDE 10 MG PO TABS: 10.0000 mg | ORAL_TABLET | Freq: Two times a day (BID) | ORAL | 3 refills | Status: DC

## 2019-06-11 MED ORDER — SITAGLIPTIN PHOSPHATE 100 MG PO TABS: 100.0000 mg | ORAL_TABLET | Freq: Every day | ORAL | 3 refills | Status: DC

## 2019-06-11 NOTE — Progress Notes (Signed)
Virtual Visit via Video Note  I connected with Debra [name] on 06/11/19 at  1:00 PM EST by a video enabled telemedicine application and verified that I am speaking with the correct person using two identifiers.  The patient and the provider were at separate locations throughout the entire encounter.   I discussed the limitations of evaluation and management by telemedicine and the availability of in person appointments. The patient expressed understanding and agreed to proceed. The patient and the provider were the only parties present for the visit unless noted in HPI below.  History of Present Illness: The patient is a 44 y.o. female with visit for diabetes. Taking januvia 100 mg and glipizide 10 mg BID. Sugars started running high several weeks ago. She was in the midst of losing insurance and now she has medicaid so that has provided some relief to the stress. Maybe was using more sugar in things but not hugely different. No signs of any infections. Started coming down since she made changes to her medicines. She used to take rybelsus 7 mg daily but had to stop this. Overall it is improving some. Has tried changing diet as well  Observations/Objective: Appearance: normal, breathing appears normal, casual grooming, abdomen does not appear distended, throat normal, memory normal, mental status is A and O times 3  Assessment and Plan: See problem oriented charting  Follow Up Instructions: refill januvia and glipizide, continue for 2 more weeks and then check in on sugar readings  I discussed the assessment and treatment plan with the patient. The patient was provided an opportunity to ask questions and all were answered. The patient agreed with the plan and demonstrated an understanding of the instructions.   The patient was advised to call back or seek an in-person evaluation if the symptoms worsen or if the condition fails to improve as anticipated.  Myrlene Broker, MD

## 2019-06-11 NOTE — Assessment & Plan Note (Signed)
Now taking januvia 100 mg daily and glipizide 10 mg BID. Refilled to her pharmacy and she will continue monitoring sugars and check in with [name] in 2 weeks or so. Adjust as needed then.

## 2019-06-14 ENCOUNTER — Encounter: Payer: Self-pay | Admitting: Internal Medicine

## 2019-06-14 NOTE — Progress Notes (Signed)
PATIENT: Debra [name] DOB: 05-23-1975  REASON FOR VISIT: follow up HISTORY FROM: patient  Chief Complaint  Patient presents with  . Follow-up    [place]. Alone. States she is doing well. CPAP works well. Sometimes is too hot.      HISTORY OF PRESENT ILLNESS: Today 06/15/19 Debra [name] is a 44 y.o. female here today for follow up for OSA on CPAP. Sleep study revealed mild OSA with total AHI of 5.5/hr but strong REM dependant OSA with AHI of 24/hr. She was started on auto PAP therapy at 6-12cmH20 pressure and medium Dreamwear FFM. She is doing very well with CPAP therapy.  She has adjusted well and noted significant improvement in sleep quality and increased energy.  She does report that he had humidity is, at times, too hot and causes her to have hot flash.  She has not tried adjusting the settings on her CPAP machine at home.  Compliance report dated 05/15/2019 through 06/13/2019 reveals that she used CPAP 30 out of the last 30 days for compliance of 100%.  26 of the last 30 days she used CPAP greater than 4 hours for compliance of 87%.  Average usage was 6 hours and 50 minutes.  Residual AHI was 0.2 on 6 to 12 cm of water and an EPR of 3.  There was no significant leak noted.    HISTORY: (copied from Dr Edwena Felty note on 12/10/2018)  Debra [name] a 44 y.o. year old 30 or [place] American female patientseen here upon referralby Venancio Poisson on 12/10/2018   Chiefconcernaccording to patient : " My sleeping patten has changed with my recent stroke- and started in March 19th 2019 ""  My doctors d/c my hormone replacement therapy  after my strokewhich has helped me to sleep'-     I have the pleasure of seeing Debra [name] today,a right-handed [place] American female with a possible sleep disorder. She has had a stroke in the left MCA - Dr. Erlinda Hong.   has a past medical history of Allergic rhinitis, cause unspecified, Arthritis, Asthma, Diabetes mellitus type II (08/2009  dx), Endometriosis, Hyperlipemia, Hypertension, Knee pain, right, Lactose intolerance, Morbid obesity ([place]), Osteoarthritis, Ovarian cyst, and Stroke ([place]). she is in surgical menopause , status post total hysterectomy ( 2015). The patient never had a sleep study.   History Summary Ms.Sybilla L Davisis a 44 y.o.femalewith history of HTN, type 2 DM, morbid obesity, endometriosis/ovarian cyst s/p hysterectomy/SBO and seasonal allergies admitted on 06/30/17 for slurred speech and expressive aphasia.  CT head showed small cortical hypoattenuation within the left parietal occipital junction possible acute infarct. CTA head and neck on admission multiple anterior and posterior stenosis, and MRI showed a cluster of acute infarct posterior left temporal lobe and temporoparietal junction region in the left MCA territory.  After admission, patient had fluctuating left MCA syndrome especially with positional changes and BP changes.  Repeat CT showed worsening stable left medial occipital lobe infarct.  Repeat CT head and neck showed unchanged bilateral high-grade M2 stenosis.  CT perfusion with small perfusion deficit, not amendable to intervention.  Repeat MRI showed patchy acute infarct in the left posterior MCA and watershed distributions.  Underwent cerebral angiogram on 07/04/2017, found to have left MCA inferior division >75% stenosis and right M1 80% stenosis, no vasculitis but atherosclerosis.  At end of procedure patient had a vasovagal event while holding pressure at the right groin.  BP dropped and she again had left MCA syndrome.  She was given IV bolus, put into Trendelenburg position and treated with pressors.  BP improved and symptoms resolved.  She was admitted in ICU after procedure, continues to have mild expressive aphasia but motor function at baseline.  LDL 129, and A1c 8.5.  UDS negative.  Hypercoagulable work-up negative.  She received left MCA stenting on 07/07/2017.  Her symptoms improved and was  discharged home on 07/08/2017 with home health PT/speech.  Home medication including aspirin, Brilinta and Lipitor 80.    Sleeprelevant medical history:  Tonsillectomy; none , cervical spine surgery no ENT surgery or trauma. She is morbidly obese.    Familymedical /sleep history:No other family member on CPAP with OSA.father had a CVA in his 38s and remained aphasic. HTN, DM .   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   and lives in a household with 2 persons- she and her mother . Family status is single. Pets are not  present. Tobacco use: none . ETOH use ; none . Caffeine intake in form of Coffee (none) Soda(2 cans a day ) Tea ( rarely) , no energy drinks. Regular exercise in form of walking  Hobbies :  Shooting.    Sleep habits are as follows: The patient's dinner time is between 6-7 PM. The patient goes to bed at 11-12 PM and continues to have trouble to initiate sleep for several hours, most nights. If asleep, stays asleep for hours. She is easily woken by sounds She may wake for one bathroom break - but cannot go back to sleep after. Her bedroom is not cool, quiet and dark- her mother needs the home warmer. She sweats a lot.  The preferred sleep position is lateral , with the support of 2 pillows. Dreams are reportedly frequent and vivid. Not nightmarish.    10.30 AM is the usual rise time. The patient wakes up spontaneously.  She reports not feeling refreshed or restored in AM, rarely having symptoms such as dry mouth or morning headaches but residual fatigue.  Naps are not taken.   REVIEW OF SYSTEMS: Out of a complete 14 system review of symptoms, the patient complains only of the following symptoms, none and all other reviewed systems are negative.  Epworth sleepiness scale: 2 Fatigue severity scale: 16  ALLERGIES: Allergies  Allergen Reactions  . Dust Mite Extract   . Influenza  Vaccines   . Metformin And Related Diarrhea  . Morphine Hives  . Nutritional Supplements     NUT allergy  . Augmentin [Amoxicillin-Pot Clavulanate] Rash    To bilat post hands only    HOME MEDICATIONS: Outpatient Medications Prior to Visit  Medication Sig Dispense Refill  . albuterol (PROAIR HFA) 108 (90 BASE) MCG/ACT inhaler Inhale 2 puffs into the lungs every 4 (four) hours as needed for wheezing or shortness of breath. 18 g 2  . albuterol (PROVENTIL) (2.5 MG/3ML) 0.083% nebulizer solution Take 2.5 mg by nebulization every 6 (six) hours as needed for wheezing or shortness of breath.    [name] amLODipine (NORVASC) 2.5 MG tablet Take 3 tablets (7.5 mg total) by mouth daily. 270 tablet 3  . aspirin EC 81 MG tablet Take 81 mg by mouth daily.    . blood glucose meter kit and supplies Dispense based on patient and insurance preference. Use up to four times daily as directed. (FOR ICD-9 250.00, 250.01). 1 each 0  . Docusate Sodium (COLACE PO) Take by  mouth.    . EPINEPHrine 0.3 mg/0.3 mL IJ SOAJ injection Inject 0.3 mLs (0.3 mg total) into the muscle as needed for anaphylaxis. 1 each 1  . fexofenadine (ALLEGRA) 180 MG tablet Take 180 mg by mouth daily as needed. For seasonal allergies    . fluticasone (FLONASE) 50 MCG/ACT nasal spray Place 2 sprays into both nostrils daily. 16 g 6  . glipiZIDE (GLUCOTROL) 10 MG tablet Take 1 tablet (10 mg total) by mouth 2 (two) times daily before a meal. 180 tablet 3  . glucose blood test strip Used to check blood sugars up to four times daily as needed 200 each 3  . ibuprofen (ADVIL) 800 MG tablet Take 1 tablet (800 mg total) by mouth 3 (three) times daily. 21 tablet 0  . Lancets MISC Used to check blood sugars up to four times daily as needed 200 each 3  . losartan (COZAAR) 100 MG tablet TAKE 1 TABLET(100 MG) BY MOUTH DAILY 90 tablet 3  . montelukast (SINGULAIR) 10 MG tablet Take 1 tablet (10 mg total) by mouth at bedtime. 30 tablet 3  . olopatadine (PATANOL) 0.1  % ophthalmic solution Apply to eye.    . rosuvastatin (CRESTOR) 40 MG tablet Take 1 tablet (40 mg total) by mouth daily. 90 tablet 3  . sitaGLIPtin (JANUVIA) 100 MG tablet Take 1 tablet (100 mg total) by mouth daily. 90 tablet 3  . ticagrelor (BRILINTA) 90 MG TABS tablet Take 0.5 tablets (45 mg total) by mouth 2 (two) times daily. 60 tablet 0  . fluticasone (FLONASE) 50 MCG/ACT nasal spray Place 2 sprays into both nostrils daily. 16 g 6   No facility-administered medications prior to visit.    PAST MEDICAL HISTORY: Past Medical History:  Diagnosis Date  . Allergic rhinitis, cause unspecified   . Arthritis   . Asthma   . Diabetes mellitus type II 08/2009 dx  . Endometriosis   . Hyperlipemia   . Hypertension   . Knee pain, right    DJD, post traumatic with repeat falls  . Lactose intolerance   . Morbid obesity ([place])    s/p lab band 09/2010  . Osteoarthritis   . Ovarian cyst    right s/p resection June 2013  . Stroke [facility])     PAST SURGICAL HISTORY: Past Surgical History:  Procedure Laterality Date  . ABDOMINAL HYSTERECTOMY  05/14/12   [place] [facility]  . IR ANGIO INTRA EXTRACRAN SEL COM CAROTID INNOMINATE BILAT MOD SED  07/04/2017  . IR ANGIO VERTEBRAL SEL VERTEBRAL BILAT MOD SED  07/04/2017  . IR INTRA CRAN STENT  07/07/2017  . IR RADIOLOGIST EVAL & MGMT  08/13/2017  . LAPAROSCOPIC GASTRIC BANDING  10/02/2010   start weight 349#  . RADIOLOGY WITH ANESTHESIA N/A 07/07/2017   Procedure: STENTING;  Surgeon: Luanne Bras, MD;  Location: [place];  Service: Radiology;  Laterality: N/A;  . SALPINGOOPHORECTOMY  10/08/11   right    FAMILY HISTORY: Family History  Problem Relation Age of Onset  . Heart disease Mother   . Hypertension Mother   . Diabetes Mother   . Stroke Father 48  . Diabetes Father   . Hypertension Father   . Hyperlipidemia Father   . Heart disease Father   . Kidney disease Father   . Cancer Father   . Liver disease Father   . Obesity Father      SOCIAL HISTORY: Social History   Socioeconomic History  . Marital status: Single  Spouse name: n/a  . Number of children: 0  . Years of education: 12+  . Highest education level: Not on file  Occupational History  . Occupation: Retired  Tobacco Use  . Smoking status: Never Smoker  . Smokeless tobacco: Never Used  Substance and Sexual Activity  . Alcohol use: No    Alcohol/week: 0.0 standard drinks  . Drug use: No  . Sexual activity: Not on file  Other Topics Concern  . Not on file  Social History Narrative   Single, lives with mom.    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   Completed some college.   Social Determinants of Health   Financial Resource Strain:   . Difficulty of Paying Living Expenses: Not on file  Food Insecurity:   . Worried About Charity fundraiser in the Last Year: Not on file  . Ran Out of Food in the Last Year: Not on file  Transportation Needs:   . Lack of Transportation (Medical): Not on file  . Lack of Transportation (Non-Medical): Not on file  Physical Activity:   . Days of Exercise per Week: Not on file  . Minutes of Exercise per Session: Not on file  Stress:   . Feeling of Stress : Not on file  Social Connections:   . Frequency of Communication with Friends and Family: Not on file  . Frequency of Social Gatherings with Friends and Family: Not on file  . Attends Religious Services: Not on file  . Active Member of Clubs or Organizations: Not on file  . Attends Archivist Meetings: Not on file  . Marital Status: Not on file  Intimate Partner Violence:   . Fear of Current or Ex-Partner: Not on file  . Emotionally Abused: Not on file  . Physically Abused: Not on file  . Sexually Abused: Not on file      PHYSICAL EXAM  Vitals:   06/15/19 0806  BP: 123/81  Pulse: 65  Temp: (!) 97.3 F (36.3 C)  Weight: (!) 334 lb 12.8 oz (151.9 kg)   Height: '5\' 8"'  (1.727 m)   Body mass index is 50.91 kg/m.  Generalized: Well developed, in no acute distress  Cardiology: normal rate and rhythm, no murmur noted Respiratory: clear to auscultation bilaterally  Neurological examination  Mentation: Alert oriented to time, place, history taking. Follows all commands speech and language fluent Cranial nerve II-XII: Pupils were equal round reactive to light. Extraocular movements were full, visual field were full  Motor: The motor testing reveals 5 over 5 strength of all 4 extremities. Good symmetric motor tone is noted throughout.  Gait and station: Gait is normal.   DIAGNOSTIC DATA (LABS, IMAGING, TESTING) - I reviewed patient records, labs, notes, testing and imaging myself where available.  No flowsheet data found.   Lab Results  Component Value Date   WBC 11.2 (H) 04/16/2019   HGB 14.1 04/16/2019   HCT 45.9 04/16/2019   MCV 82.7 04/16/2019   PLT 331 04/16/2019      Component Value Date/Time   NA 138 04/16/2019 2014   NA 142 07/01/2018 1412   K 3.6 04/16/2019 2014   CL 104 04/16/2019 2014   CO2 24 04/16/2019 2014   GLUCOSE 189 (H) 04/16/2019 2014   BUN 8 04/16/2019 2014   BUN 10 07/01/2018 1412   CREATININE 0.80 04/16/2019 2014   CALCIUM 9.2 04/16/2019 2014  PROT 6.0 04/14/2019 1140   PROT 6.7 07/01/2018 1412   ALBUMIN 3.7 04/14/2019 1140   ALBUMIN 4.2 07/01/2018 1412   AST 13 04/14/2019 1140   ALT 18 04/14/2019 1140   ALKPHOS 99 04/14/2019 1140   BILITOT 0.3 04/14/2019 1140   BILITOT 0.3 07/01/2018 1412   GFRNONAA >60 04/16/2019 2014   GFRAA >60 04/16/2019 2014   Lab Results  Component Value Date   CHOL 90 10/09/2018   HDL 42.10 10/09/2018   LDLCALC 37 10/09/2018   TRIG 55.0 10/09/2018   CHOLHDL 2 10/09/2018   Lab Results  Component Value Date   HGBA1C 8.5 (A) 02/08/2019   Lab Results  Component Value Date   VITAMINB12 512 07/01/2018   Lab Results  Component Value Date   TSH 1.620 07/01/2018        ASSESSMENT AND PLAN 43 y.o. year old female  has a past medical history of Allergic rhinitis, cause unspecified, Arthritis, Asthma, Diabetes mellitus type II (08/2009 dx), Endometriosis, Hyperlipemia, Hypertension, Knee pain, right, Lactose intolerance, Morbid obesity ([place]), Osteoarthritis, Ovarian cyst, and Stroke ([place]). here with     ICD-10-CM   1. OSA on CPAP  G47.33 For home use only DME continuous positive airway pressure (CPAP)   Z99.89     Sunnie is doing very well with CPAP therapy at home.  She has adjusted well and definitely notes increased energy and improved sleep quality.  She was encouraged to continue using CPAP nightly and for greater than 4 hours each night.  I have also instructed her to call her DME company to assist her in how to adjust humidity settings with her CPAP machine.  If she continues to have difficulty with heated humidity we can discontinue.  She will follow-up with me in 6 months, sooner if needed.  She verbalizes understanding and agreement with this plan.   Orders Placed This Encounter  Procedures  . For home use only DME continuous positive airway pressure (CPAP)    Supplies please    Order Specific Question:   Length of Need    Answer:   Lifetime    Order Specific Question:   Patient has OSA or probable OSA    Answer:   Yes    Order Specific Question:   Is the patient currently using CPAP in the home    Answer:   Yes    Order Specific Question:   Settings    Answer:   Other see comments    Order Specific Question:   CPAP supplies needed    Answer:   Mask, headgear, cushions, filters, heated tubing and water chamber     No orders of the defined types were placed in this encounter.     I spent 15 minutes with the patient. 50% of this time was spent counseling and educating patient on plan of care and medications.    Debbora Presto, FNP-C 06/15/2019, 8:33 AM [facility] [address] [place], [place] [address] [phone]

## 2019-06-14 NOTE — Patient Instructions (Addendum)
Please continue using your CPAP regularly. While your insurance requires that you use CPAP at least 4 hours each night on 70% of the nights, I recommend, that you not skip any nights and use it throughout the night if you can. Getting used to CPAP and staying with the treatment long term does take time and patience and discipline. Untreated obstructive sleep apnea when it is moderate to severe can have an adverse impact on cardiovascular health and raise her risk for heart disease, arrhythmias, hypertension, congestive heart failure, stroke and diabetes. Untreated obstructive sleep apnea causes sleep disruption, nonrestorative sleep, and sleep deprivation. This can have an impact on your day to day functioning and cause daytime sleepiness and impairment of cognitive function, memory loss, mood disturbance, and problems focussing. Using CPAP regularly can improve these symptoms.  Follow up in 6 months, sooner if needed   Sleep Apnea Sleep apnea affects breathing during sleep. It causes breathing to stop for a short time or to become shallow. It can also increase the risk of:  Heart attack.  Stroke.  Being very overweight (obese).  Diabetes.  Heart failure.  Irregular heartbeat. The goal of treatment is to help you breathe normally again. What are the causes? There are three kinds of sleep apnea:  Obstructive sleep apnea. This is caused by a blocked or collapsed airway.  Central sleep apnea. This happens when the brain does not send the right signals to the muscles that control breathing.  Mixed sleep apnea. This is a combination of obstructive and central sleep apnea. The most common cause of this condition is a collapsed or blocked airway. This can happen if:  Your throat muscles are too relaxed.  Your tongue and tonsils are too large.  You are overweight.  Your airway is too small. What increases the risk?  Being overweight.  Smoking.  Having a small airway.  Being  older.  Being female.  Drinking alcohol.  Taking medicines to calm yourself (sedatives or tranquilizers).  Having family members with the condition. What are the signs or symptoms?  Trouble staying asleep.  Being sleepy or tired during the day.  Getting angry a lot.  Loud snoring.  Headaches in the morning.  Not being able to focus your mind (concentrate).  Forgetting things.  Less interest in sex.  Mood swings.  Personality changes.  Feelings of sadness (depression).  Waking up a lot during the night to pee (urinate).  Dry mouth.  Sore throat. How is this diagnosed?  Your medical history.  A physical exam.  A test that is done when you are sleeping (sleep study). The test is most often done in a sleep lab but may also be done at home. How is this treated?   Sleeping on your side.  Using a medicine to get rid of mucus in your nose (decongestant).  Avoiding the use of alcohol, medicines to help you relax, or certain pain medicines (narcotics).  Losing weight, if needed.  Changing your diet.  Not smoking.  Using a machine to open your airway while you sleep, such as: ? An oral appliance. This is a mouthpiece that shifts your lower jaw forward. ? A CPAP device. This device blows air through a mask when you breathe out (exhale). ? An EPAP device. This has valves that you put in each nostril. ? A BPAP device. This device blows air through a mask when you breathe in (inhale) and breathe out.  Having surgery if other treatments do not work.   It is important to get treatment for sleep apnea. Without treatment, it can lead to:  High blood pressure.  Coronary artery disease.  In men, not being able to have an erection (impotence).  Reduced thinking ability. Follow these instructions at home: Lifestyle  Make changes that your doctor recommends.  Eat a healthy diet.  Lose weight if needed.  Avoid alcohol, medicines to help you relax, and some pain  medicines.  Do not use any products that contain nicotine or tobacco, such as cigarettes, e-cigarettes, and chewing tobacco. If you need help quitting, ask your doctor. General instructions  Take over-the-counter and prescription medicines only as told by your doctor.  If you were given a machine to use while you sleep, use it only as told by your doctor.  If you are having surgery, make sure to tell your doctor you have sleep apnea. You may need to bring your device with you.  Keep all follow-up visits as told by your doctor. This is important. Contact a doctor if:  The machine that you were given to use during sleep bothers you or does not seem to be working.  You do not get better.  You get worse. Get help right away if:  Your chest hurts.  You have trouble breathing in enough air.  You have an uncomfortable feeling in your back, arms, or stomach.  You have trouble talking.  One side of your body feels weak.  A part of your face is hanging down. These symptoms may be an emergency. Do not wait to see if the symptoms will go away. Get medical help right away. Call your local emergency services (911 in the [place] Do not drive yourself to the hospital. Summary  This condition affects breathing during sleep.  The most common cause is a collapsed or blocked airway.  The goal of treatment is to help you breathe normally while you sleep. This information is not intended to replace advice given to you by your health care provider. Make sure you discuss any questions you have with your health care provider. Document Revised: 01/23/2018 Document Reviewed: 12/02/2017 Elsevier Patient Education  2020 [employer]vier Inc.  

## 2019-06-15 ENCOUNTER — Encounter: Payer: Self-pay | Admitting: Family Medicine

## 2019-06-15 ENCOUNTER — Ambulatory Visit: Payer: No Typology Code available for payment source | Admitting: Family Medicine

## 2019-06-15 ENCOUNTER — Other Ambulatory Visit: Payer: Self-pay

## 2019-06-15 VITALS — BP 123/81 | HR 65 | Temp 97.3°F | Ht 68.0 in | Wt 334.8 lb

## 2019-06-15 DIAGNOSIS — Z9989 Dependence on other enabling machines and devices: Secondary | ICD-10-CM

## 2019-06-15 DIAGNOSIS — G4733 Obstructive sleep apnea (adult) (pediatric): Secondary | ICD-10-CM

## 2019-06-18 NOTE — Telephone Encounter (Signed)
Pt has been informed and will try the patient assistance program.

## 2019-06-21 ENCOUNTER — Telehealth: Payer: Self-pay

## 2019-06-21 NOTE — Telephone Encounter (Signed)
  New message    The patient call has a form been sent to the office medication assistance  ticagrelor (BRILINTA) 90 MG TABS tablet

## 2019-06-22 NOTE — Telephone Encounter (Signed)
Called pt, LVM.   No form has been received.

## 2019-06-29 ENCOUNTER — Telehealth: Payer: Self-pay

## 2019-06-29 NOTE — Telephone Encounter (Signed)
New message    The patient calling will be dropping off paperwork for the MD to review / filled out from [employer]     Due to no insurance.

## 2019-06-29 NOTE — Telephone Encounter (Signed)
noted 

## 2019-07-01 ENCOUNTER — Encounter: Payer: Self-pay | Admitting: Family Medicine

## 2019-07-01 ENCOUNTER — Other Ambulatory Visit: Payer: Self-pay

## 2019-07-01 ENCOUNTER — Ambulatory Visit (INDEPENDENT_AMBULATORY_CARE_PROVIDER_SITE_OTHER): Payer: Medicaid Other | Admitting: Family Medicine

## 2019-07-01 ENCOUNTER — Ambulatory Visit: Payer: Self-pay

## 2019-07-01 VITALS — BP 120/72 | HR 68 | Ht 68.0 in | Wt 336.4 lb

## 2019-07-01 DIAGNOSIS — M79644 Pain in right finger(s): Secondary | ICD-10-CM

## 2019-07-01 NOTE — Patient Instructions (Addendum)
Thank you for coming in today. Call or go to the ER if you develop a large red swollen joint with extreme pain or oozing puss.  Let me know if this does not improve enough.  I can arrange for occupational therapy.    Phone for OT Ambulatory referral to Occupational Therapy Where: Outpatient Rehabilitation [facility] Address: [id number] [facility] [place] [address] Phone: [phone]
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
## 2019-07-12 ENCOUNTER — Other Ambulatory Visit: Payer: Self-pay

## 2019-07-12 ENCOUNTER — Encounter: Payer: Self-pay | Admitting: Occupational Therapy

## 2019-07-12 ENCOUNTER — Ambulatory Visit: Payer: Medicaid Other | Attending: Family Medicine | Admitting: Occupational Therapy

## 2019-07-12 DIAGNOSIS — M79641 Pain in right hand: Secondary | ICD-10-CM | POA: Insufficient documentation

## 2019-07-12 DIAGNOSIS — M6281 Muscle weakness (generalized): Secondary | ICD-10-CM | POA: Diagnosis present

## 2019-07-12 DIAGNOSIS — M25641 Stiffness of right hand, not elsewhere classified: Secondary | ICD-10-CM | POA: Diagnosis present
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
## 2019-07-19 ENCOUNTER — Other Ambulatory Visit (HOSPITAL_COMMUNITY): Payer: Self-pay | Admitting: Interventional Radiology

## 2019-07-19 DIAGNOSIS — I771 Stricture of artery: Secondary | ICD-10-CM

## 2019-07-20 ENCOUNTER — Telehealth: Payer: Self-pay

## 2019-07-20 NOTE — Telephone Encounter (Signed)
New message    [employer]  is requesting additional information on paperwork 30-60 quantity left off ticagrelor (BRILINTA) 90 MG TABS tablet

## 2019-07-21 NOTE — Telephone Encounter (Signed)
F/u   Call the patient and left a voicemail asking for a phone number to [employer] the office phone number was given to call back

## 2019-07-21 NOTE — Telephone Encounter (Signed)
   Patient calling back to clarify details Prescription needs to be written for 30 day supply, 11 refills [employer] phone [phone] Fax [phone]

## 2019-07-22 ENCOUNTER — Other Ambulatory Visit: Payer: Self-pay

## 2019-07-22 ENCOUNTER — Other Ambulatory Visit: Payer: Self-pay | Admitting: Family Medicine

## 2019-07-22 NOTE — Telephone Encounter (Signed)
Pt advised.

## 2019-07-22 NOTE — Telephone Encounter (Signed)
I called pt back that Dr Corliss Skains has always manage her brilinita medication or the PA. I gave her numbers for the PA and assistance at Dr. Corliss Skains office. She verbalized understanding. Pt has an appt in April 2021 to see him for follow up.

## 2019-07-22 NOTE — Telephone Encounter (Signed)
1) Medication(s) Requested (by name): ticagrelor (BRILINTA) 90 MG TABS tablet   2) Pharmacy of Choice: [facility] with manufacturer   3) Special Requests:   pt states that her PCP is out and she unable to  Send the prescription over to pharmacy/prescription program and would like to know since medication was originally prescribed by neurologist if she can send.

## 2019-07-22 NOTE — Telephone Encounter (Signed)
New message:    Pt is calling to follow up on the status of this fax. Please call patient to inform where we are on this.

## 2019-07-28 ENCOUNTER — Ambulatory Visit: Payer: No Typology Code available for payment source | Attending: Family Medicine | Admitting: Occupational Therapy

## 2019-07-28 ENCOUNTER — Encounter: Payer: Self-pay | Admitting: Occupational Therapy

## 2019-07-28 ENCOUNTER — Other Ambulatory Visit: Payer: Self-pay

## 2019-07-28 DIAGNOSIS — M25641 Stiffness of right hand, not elsewhere classified: Secondary | ICD-10-CM | POA: Diagnosis present

## 2019-07-28 DIAGNOSIS — M79641 Pain in right hand: Secondary | ICD-10-CM | POA: Insufficient documentation

## 2019-07-28 DIAGNOSIS — M6281 Muscle weakness (generalized): Secondary | ICD-10-CM | POA: Diagnosis present

## 2019-07-28 NOTE — Telephone Encounter (Signed)
This should go to cardiology since they are prescribing

## 2019-07-28 NOTE — Patient Instructions (Signed)
Access Code: [email or web address] URL: [email or web address] Date: 07/28/2019 Prepared by: Merleen Milliner  Exercises Hand Fist Pumps - 3-4 x daily - 7 x weekly - 3 sets - 10 reps Hand AROM PIP Blocking - 2 x daily - 7 x weekly - 3 sets - 10 reps Hand AROM Flat Fist - 2 x daily - 7 x weekly - 3 sets - 10 reps

## 2019-07-28 NOTE — Therapy (Signed)
[facility] [address] [place], [place], [address] Phone: [phone]   Fax:  [phone]  Occupational Therapy Treatment  Patient Details  Name: Debra [name] MRN: [id number] Date of Birth: February 02, 1976 Referring Provider (OT): Dr. Lynne Leader   Encounter Date: 07/28/2019  OT End of Session - 07/28/19 1605    OT Start Time  1530    OT Stop Time  1610    OT Time Calculation (min)  40 min    Activity Tolerance  Patient tolerated treatment well    Behavior During Therapy  [facility] for tasks assessed/performed       Past Medical History:  Diagnosis Date  . Allergic rhinitis, cause unspecified   . Arthritis   . Asthma   . Diabetes mellitus type II 08/2009 dx  . Endometriosis   . Hyperlipemia   . Hypertension   . Knee pain, right    DJD, post traumatic with repeat falls  . Lactose intolerance   . Morbid obesity ([place])    s/p lab band 09/2010  . Osteoarthritis   . Ovarian cyst    right s/p resection June 2013  . Stroke [facility])     Past Surgical History:  Procedure Laterality Date  . ABDOMINAL HYSTERECTOMY  05/14/12   [facility]  . IR ANGIO INTRA EXTRACRAN SEL COM CAROTID INNOMINATE BILAT MOD SED  07/04/2017  . IR ANGIO VERTEBRAL SEL VERTEBRAL BILAT MOD SED  07/04/2017  . IR INTRA CRAN STENT  07/07/2017  . IR RADIOLOGIST EVAL & MGMT  08/13/2017  . LAPAROSCOPIC GASTRIC BANDING  10/02/2010   start weight 349#  . RADIOLOGY WITH ANESTHESIA N/A 07/07/2017   Procedure: STENTING;  Surgeon: Luanne Bras, MD;  Location: [facility];  Service: Radiology;  Laterality: N/A;  . SALPINGOOPHORECTOMY  10/08/11   right    There were no vitals filed for this visit.  Subjective Assessment - 07/28/19 1537    Subjective   Its a tad better.  Patient got injection 3/11.  I get checked again on Monday - 4/12    Currently in Pain?  No/denies    Pain Score  0-No pain    Pain Location  Finger (Comment which one)   index   Pain  Orientation  Right    Pain Descriptors / Indicators  Sharp;Throbbing    Pain Radiating Towards  PIP    Pain Onset  More than a month ago    Pain Frequency  Intermittent    Aggravating Factors   Gripping    Pain Relieving Factors  heat, ice, recent injection                   OT Treatments/Exercises (OP) - 07/28/19 0001      Hand Exercises   Other Hand Exercises  PIP, DIP blocking exercises for index - right.  flat fist working toward full fist - working within pain tolerance    Other Hand Exercises  edema massage and educated       [place]   Number Minutes Fluidotherapy  10 Minutes    RUE Fluidotherapy Location  Hand;Wrist    Comments  pain relief             OT Education - 07/28/19 1602    Education Details  edema management, hook fist, blocking exercises for index    Person(s) Educated  Patient    Methods  Explanation;Tactile cues;Demonstration;Verbal cues;Handout    Comprehension  Verbalized understanding;Returned demonstration;Need further  instruction       OT Short Term Goals - 07/12/19 1521      OT SHORT TERM GOAL #1   Title  Pt will be mod I with HEP to address ROM/stiffness and pain in R hand - 08/09/2019    Status  New      OT SHORT TERM GOAL #2   Title  Pt will demonstrate full composite flexion in R hand with no more than 2/10 pain (no resistance just ROM) to assist with light grasping activities    Status  New      OT SHORT TERM GOAL #3   Title  Pt will report pain no greater than 2/10 with functional activities requiring little resistance (such as driving, typing, buttoning, zipping,    Status  New      OT SHORT TERM GOAL #4   Title  Pt will verbalize understanding of use of modalities to address edema, pain (such as heat and ice)    Status  New        OT Long Term Goals - 07/12/19 1524      OT LONG TERM GOAL #1   Title  Pt will be mod I with gentle strengthing program to improve both grip and pinch strength in R hand  -  09/16/2019    Status  New      OT LONG TERM GOAL #2   Title  Pt will report pain no greater than 2/10 with more resistive activities such as cutting food on plate, cooking, picking up items, home mgmt tasks    Status  New      OT LONG TERM GOAL #3   Title  Pt will demonstrate ability to write 3 sentence pararaph with normal hand orienation (AE prn) with no greater than 2/10 pain    Status  New      OT LONG TERM GOAL #4   Title  Pt will demonstrate improve grip strength by at least 5 pounds to assist with home mgmt tasks    Status  New            Plan - 07/28/19 1609    Clinical Impression Statement  Patient showing improved range of motion and reduced swelling in index finger.  Patient motivated for improvement    OT Frequency  2x / week    OT Duration  8 weeks    OT Treatment/Interventions  Self-care/ADL training;Moist Heat;Fluidtherapy;Contrast Bath;Therapeutic exercise;Manual Therapy;Ultrasound;DME and/or AE instruction;Passive range of motion;Patient/family education;Therapeutic activities;Splinting    Plan  Start with fluido, check HEP, and update -needs range PIP, DIP composite flexion    Consulted and Agree with Plan of Care  Patient       Patient will benefit from skilled therapeutic intervention in order to improve the following deficits and impairments:           Visit Diagnosis: Pain in right hand  Stiffness of right hand, not elsewhere classified  Muscle weakness (generalized)    Problem List Patient Active Problem List   Diagnosis Date Noted  . Lightheadedness 04/14/2019  . Allergic reaction 03/02/2019  . Cerebrovascular accident (CVA) due to occlusion of left middle cerebral artery (HCC) 01/08/2019  . Psychophysiological insomnia 01/08/2019  . Menopausal vasomotor syndrome 01/08/2019  . OSA (obstructive sleep apnea) 01/08/2019  . Hot flashes 10/04/2017  . Stenosis of both middle cerebral arteries 07/07/2017  . Hyperlipidemia associated with type  2 diabetes mellitus (HCC)   . Hx of completed stroke 07/01/2017  .  Left shoulder pain 05/21/2016  . Routine general medical examination at a health care facility 10/13/2015  . Hypertension 08/26/2011  . Hx of laparoscopic gastric banding, 10/02/2010. 11/27/2010  . Diabetes mellitus type 2, uncontrolled (HCC) 08/23/2009  . Morbid obesity (HCC) 08/22/2009  . Allergic rhinitis 08/22/2009  . Asthma 08/22/2009  . ARTHRITIS 08/22/2009    Collier [place], OTR/L 07/28/2019, 4:12 PM  [facility] [address] [place], [place], [address] Phone: [phone]   Fax:  [phone]  Name: Debra [name] MRN: [id number] Date of Birth: 1976/04/18

## 2019-08-02 ENCOUNTER — Other Ambulatory Visit: Payer: Self-pay

## 2019-08-02 ENCOUNTER — Ambulatory Visit (INDEPENDENT_AMBULATORY_CARE_PROVIDER_SITE_OTHER): Payer: Medicaid Other | Admitting: Family Medicine

## 2019-08-02 ENCOUNTER — Encounter: Payer: Self-pay | Admitting: Family Medicine

## 2019-08-02 VITALS — BP 120/68 | HR 62 | Ht 68.0 in | Wt 337.2 lb

## 2019-08-02 DIAGNOSIS — M79644 Pain in right finger(s): Secondary | ICD-10-CM
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
## 2019-08-02 NOTE — Patient Instructions (Signed)
Thank you for coming in today.  Continue with therapy and home exercises.  Recheck with me as needed.  Let me know if you need me to re-authorize hand therapy.

## 2019-08-03 MED ORDER — TICAGRELOR 90 MG PO TABS: 45.0000 mg | ORAL_TABLET | Freq: Two times a day (BID) | ORAL | 11 refills | Status: DC

## 2019-08-03 NOTE — Addendum Note (Signed)
Addended by: Verlan Friends on: 08/03/2019 08:23 AM   Modules accepted: Orders

## 2019-08-03 NOTE — Telephone Encounter (Signed)
I have completed the form and printed the rx for PCP to sign.

## 2019-08-04 ENCOUNTER — Other Ambulatory Visit: Payer: Self-pay

## 2019-08-04 ENCOUNTER — Encounter: Payer: Self-pay | Admitting: Occupational Therapy

## 2019-08-04 ENCOUNTER — Ambulatory Visit: Payer: No Typology Code available for payment source | Admitting: Occupational Therapy

## 2019-08-04 DIAGNOSIS — M79641 Pain in right hand: Secondary | ICD-10-CM

## 2019-08-04 DIAGNOSIS — M25641 Stiffness of right hand, not elsewhere classified: Secondary | ICD-10-CM

## 2019-08-04 DIAGNOSIS — M6281 Muscle weakness (generalized): Secondary | ICD-10-CM

## 2019-08-04 NOTE — Therapy (Signed)
[facility] [address] [place], [place], [address] Phone: [phone]   Fax:  [phone]  Occupational Therapy Treatment  Patient Details  Name: Debra [name] MRN: [id number] Date of Birth: 16-Jan-1976 Referring Provider (OT): Dr. Clementeen Graham   Encounter Date: 08/04/2019  OT End of Session - 08/04/19 1730    Visit Number  3    Number of Visits  16    Date for OT Re-Evaluation  09/06/19    Authorization Type  self pay    Authorization Time Period  asked pt to wait to schedule first tx until next week to allow time for insurance verification    OT Start Time  1530    OT Stop Time  1613    OT Time Calculation (min)  43 min    Activity Tolerance  Patient tolerated treatment well    Behavior During Therapy  [facility] for tasks assessed/performed       Past Medical History:  Diagnosis Date  . Allergic rhinitis, cause unspecified   . Arthritis   . Asthma   . Diabetes mellitus type II 08/2009 dx  . Endometriosis   . Hyperlipemia   . Hypertension   . Knee pain, right    DJD, post traumatic with repeat falls  . Lactose intolerance   . Morbid obesity (HCC)    s/p lab band 09/2010  . Osteoarthritis   . Ovarian cyst    right s/p resection June 2013  . Stroke [facility])     Past Surgical History:  Procedure Laterality Date  . ABDOMINAL [place]STERECTOMY  05/14/12   [place]  . IR ANGIO INTRA EXTRACRAN SEL COM CAROTID INNOMINATE BILAT MOD SED  07/04/2017  . IR ANGIO VERTEBRAL SEL VERTEBRAL BILAT MOD SED  07/04/2017  . IR INTRA CRAN STENT  07/07/2017  . IR RADIOLOGIST EVAL & MGMT  08/13/2017  . LAPAROSCOPIC GASTRIC BANDING  10/02/2010   start weight 349#  . RADIOLOGY WITH ANESTHESIA N/A 07/07/2017   Procedure: STENTING;  Surgeon: Julieanne Cotton, MD;  Location: [facility] OR;  Service: Radiology;  Laterality: N/A;  . SALPINGOOPHORECTOMY  10/08/11   right    There were no vitals filed for this visit.  Subjective Assessment  - 08/04/19 1540    Subjective   You are going to be proud of me!  Patient able to make contact with index to palm for flat fist.  "Typing is a little better"    Currently in Pain?  No/denies    Pain Score  0-No pain                   OT Treatments/Exercises (OP) - 08/04/19 0001      ADLs   Home Maintenance  Patient reports that she is now able to open the dryer, hanging clothes (like clothespin)    Work  Patient reports that typing is getting a little better - more of an issue with reaching with index finger, then pressure down onto keys.        Hand Exercises   Other Hand Exercises  continuing to work toward full composite flexion    Other Hand Exercises  reviewed edema management techniques - massage, elevation, pumping      Modalities   Modalities  Cryotherapy   final 5 min     RUE Fluidotherapy   Number Minutes Fluidotherapy  10 Minutes    RUE Fluidotherapy Location  Hand;Wrist    Comments  prep for stretching  Manual Therapy   Manual Therapy  Soft tissue mobilization    Manual therapy comments  Right index finger             OT Education - 08/04/19 1730    Education Details  edema management, benfits of heat/ice    Person(s) Educated  Patient    Methods  Explanation    Comprehension  Verbalized understanding       OT Short Term Goals - 08/04/19 1554      OT SHORT TERM GOAL #1   Title  Pt will be mod I with HEP to address ROM/stiffness and pain in R hand - 08/09/2019    Status  Achieved      OT SHORT TERM GOAL #2   Title  Pt will demonstrate full composite flexion in R hand with no more than 2/10 pain (no resistance just ROM) to assist with light grasping activities    Status  On-going      OT SHORT TERM GOAL #3   Title  Pt will report pain no greater than 2/10 with functional activities requiring little resistance (such as driving, typing, buttoning, zipping,    Status  On-going      OT SHORT TERM GOAL #4   Title  Pt will verbalize  understanding of use of modalities to address edema, pain (such as heat and ice)    Status  Achieved        OT Long Term Goals - 07/12/19 1524      OT LONG TERM GOAL #1   Title  Pt will be mod I with gentle strengthing program to improve both grip and pinch strength in R hand  - 09/16/2019    Status  New      OT LONG TERM GOAL #2   Title  Pt will report pain no greater than 2/10 with more resistive activities such as cutting food on plate, cooking, picking up items, home mgmt tasks    Status  New      OT LONG TERM GOAL #3   Title  Pt will demonstrate ability to write 3 sentence pararaph with normal hand orienation (AE prn) with no greater than 2/10 pain    Status  New      OT LONG TERM GOAL #4   Title  Pt will demonstrate improve grip strength by at least 5 pounds to assist with home mgmt tasks    Status  New            Plan - 08/04/19 1731    Clinical Impression Statement  Patient continues with decrease swelling, increased range, and decreased pain and therefore increased functional use of R index    OT Frequency  2x / week    OT Duration  8 weeks    OT Treatment/Interventions  Self-care/ADL training;Moist Heat;Fluidtherapy;Contrast Bath;Therapeutic exercise;Manual Therapy;Ultrasound;DME and/or AE instruction;Passive range of motion;Patient/family education;Therapeutic activities;Splinting    Plan  Start with fluido,  needs range PIP, DIP composite flexion, check in with goals - her function is improving but she still slides up to 4/10 with activity    OT Home Exercise Plan  PIP/DIP blocking, flat and full fist    Consulted and Agree with Plan of Care  Patient       Patient will benefit from skilled therapeutic intervention in order to improve the following deficits and impairments:           Visit Diagnosis: Pain in right hand  Stiffness of right hand, not  elsewhere classified  Muscle weakness (generalized)    Problem List Patient Active Problem List    Diagnosis Date Noted  . Lightheadedness 04/14/2019  . Allergic reaction 03/02/2019  . Cerebrovascular accident (CVA) due to occlusion of left middle cerebral artery ([place]) 01/08/2019  . Psychophysiological insomnia 01/08/2019  . Menopausal vasomotor syndrome 01/08/2019  . OSA (obstructive sleep apnea) 01/08/2019  . Hot flashes 10/04/2017  . Stenosis of both middle cerebral arteries 07/07/2017  . Hyperlipidemia associated with type 2 diabetes mellitus ([place])   . Hx of completed stroke 07/01/2017  . Left shoulder pain 05/21/2016  . Routine general medical examination at a health care facility 10/13/2015  . Hypertension 08/26/2011  . Hx of laparoscopic gastric banding, 10/02/2010. 11/27/2010  . Diabetes mellitus type 2, uncontrolled ([place]) 08/23/2009  . Morbid obesity ([place]) 08/22/2009  . Allergic rhinitis 08/22/2009  . Asthma 08/22/2009  . ARTHRITIS 08/22/2009    Mariah Milling, OTR/L 08/04/2019, 5:33 PM  [facility] [address] [place], [place], [address] Phone: [phone]   Fax:  [phone]  Name: Debra [name] MRN: [id number] Date of Birth: November 01, 1975

## 2019-08-09 ENCOUNTER — Ambulatory Visit: Payer: No Typology Code available for payment source | Admitting: Occupational Therapy

## 2019-08-09 ENCOUNTER — Other Ambulatory Visit: Payer: Self-pay

## 2019-08-09 DIAGNOSIS — M79641 Pain in right hand: Secondary | ICD-10-CM

## 2019-08-09 DIAGNOSIS — M6281 Muscle weakness (generalized): Secondary | ICD-10-CM

## 2019-08-09 DIAGNOSIS — M25641 Stiffness of right hand, not elsewhere classified: Secondary | ICD-10-CM
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
## 2019-08-09 NOTE — Patient Instructions (Signed)
Flexor Tendon Gliding (Active Hook Fist)   With fingers and knuckles straight, bend middle and tip joints. Do not bend large knuckles. Repeat _10-15___ times. Do _4-6___ sessions per day.  Finger Flexion / Extension   With palm up, bend fingers of right hand toward palm, making a  fist. Straighten fingers, opening fist. Repeat sequence _10-15___ times per session. Do _4-6__ sessions per day. Hand Variation: Palm down   PIP Flexion (Active Blocked)    Hold large knuckle straight using other hand. Bend middle joint of ___index___ finger as far as possible. Hold __5__ seconds. Repeat _10___ times. Do _4-6___ sessions per day.    DIP Flexion (Active Blocked)    Hold __Index____ finger firmly at the middle so that only the tip joint can bend. Hold _5___ seconds. Repeat _10___ times. Do _4-6___ sessions per day.   PIP / DIP Composite Flexion (Passive Stretch)    Use other hand to bend middle and tip joints of __index____ finger. Hold _10___ seconds. Repeat 5____ times. Do __4-6__ sessions per day.  **PEN ROLLING EXERCISE - CAN USE A HIGHLIGHTER IF PEN IS TOO HARD  1. Grip Strengthening (Resistive Putty)   Squeeze putty using thumb and all fingers. Repeat _20___ times. Do __2__ sessions per day.   2. Roll putty into tube on table and pinch between index finger and thumb x 10 reps each. Do 2 sessions per day  3. IP Fisting (Resistive Putty)    Keeping knuckles straight, bend fingertips to squeeze putty. Repeat _10___ times. Do _2___ sessions per day.

## 2019-08-10 ENCOUNTER — Ambulatory Visit (HOSPITAL_COMMUNITY): Admission: RE | Admit: 2019-08-10 | Payer: No Typology Code available for payment source | Source: Ambulatory Visit

## 2019-08-11 ENCOUNTER — Other Ambulatory Visit: Payer: Self-pay

## 2019-08-11 ENCOUNTER — Ambulatory Visit: Payer: No Typology Code available for payment source | Admitting: Occupational Therapy

## 2019-08-11 ENCOUNTER — Encounter: Payer: Self-pay | Admitting: Occupational Therapy

## 2019-08-11 DIAGNOSIS — M79641 Pain in right hand: Secondary | ICD-10-CM

## 2019-08-11 DIAGNOSIS — M25641 Stiffness of right hand, not elsewhere classified: Secondary | ICD-10-CM

## 2019-08-11 DIAGNOSIS — M6281 Muscle weakness (generalized): Secondary | ICD-10-CM

## 2019-08-11 NOTE — Therapy (Signed)
[facility] [address] [place], [place], [address] Phone: [phone]   Fax:  [phone]  Occupational Therapy Treatment  Patient Details  Name: Debra [name] MRN: [id number] Date of Birth: November 12, 1975 Referring Provider (OT): Dr. Clementeen Graham   Encounter Date: 08/11/2019  OT End of Session - 08/11/19 1533    Visit Number  5    Number of Visits  16    Date for OT Re-Evaluation  09/06/19    Authorization Type  self pay    OT Start Time  1452    OT Stop Time  1530    OT Time Calculation (min)  38 min    Activity Tolerance  Patient tolerated treatment well    Behavior During Therapy  [facility] for tasks assessed/performed       Past Medical History:  Diagnosis Date  . Allergic rhinitis, cause unspecified   . Arthritis   . Asthma   . Diabetes mellitus type II 08/2009 dx  . Endometriosis   . Hyperlipemia   . Hypertension   . Knee pain, right    DJD, post traumatic with repeat falls  . Lactose intolerance   . Morbid obesity (HCC)    s/p lab band 09/2010  . Osteoarthritis   . Ovarian cyst    right s/p resection June 2013  . Stroke [facility])     Past Surgical History:  Procedure Laterality Date  . ABDOMINAL [place]STERECTOMY  05/14/12   [place]  . IR ANGIO INTRA EXTRACRAN SEL COM CAROTID INNOMINATE BILAT MOD SED  07/04/2017  . IR ANGIO VERTEBRAL SEL VERTEBRAL BILAT MOD SED  07/04/2017  . IR INTRA CRAN STENT  07/07/2017  . IR RADIOLOGIST EVAL & MGMT  08/13/2017  . LAPAROSCOPIC GASTRIC BANDING  10/02/2010   start weight 349#  . RADIOLOGY WITH ANESTHESIA N/A 07/07/2017   Procedure: STENTING;  Surgeon: Julieanne Cotton, MD;  Location: [facility] OR;  Service: Radiology;  Laterality: N/A;  . SALPINGOOPHORECTOMY  10/08/11   right    There were no vitals filed for this visit.  Subjective Assessment - 08/11/19 1458    Subjective   Patient indicates she can now push buttons better, but opening the dryer is still a challenge     Currently in Pain?  Yes    Pain Score  3     Pain Location  Finger (Comment which one)    Pain Orientation  Right    Pain Descriptors / Indicators  Aching    Pain Type  Acute pain;Chronic pain    Pain Radiating Towards  PIP    Pain Onset  More than a month ago    Pain Frequency  Intermittent    Aggravating Factors   Grip, PROM    Pain Relieving Factors  Heat, ice, rrecent injection         [facility] OT Assessment - 08/11/19 0001      Right Hand PROM   R Index PIP 0-100  90 Degrees    R Index DIP 0-70  55 Degrees               OT Treatments/Exercises (OP) - 08/11/19 0001      Hand Exercises   Other Hand Exercises  Following ultrasound, patient with 10 more degrees of passive PIP flexion, and 5 more degrees of DIP flexion (100 PIP and 60 DIP)    Other Hand Exercises  Reviewed HEP putty, blocking exercises, and edema management.  Modalities   Modalities  Ultrasound      Ultrasound   Ultrasound Location  rght index    Ultrasound Parameters  5cm2, , 5 min continuous, 0.8w/cm2    Ultrasound Goals  Other (Comment);Pain   stiffness     RUE Fluidotherapy   RUE Fluidotherapy Location  Hand    Comments  decrease pain, stiffness               OT Short Term Goals - 08/11/19 1536      OT SHORT TERM GOAL #1   Title  Pt will be mod I with HEP to address ROM/stiffness and pain in R hand - 08/09/2019    Status  Achieved      OT SHORT TERM GOAL #2   Title  Pt will demonstrate full composite flexion in R hand with no more than 2/10 pain (no resistance just ROM) to assist with light grasping activities    Status  Achieved      OT SHORT TERM GOAL #3   Title  Pt will report pain no greater than 2/10 with functional activities requiring little resistance (such as driving, typing, buttoning, zipping,    Status  Achieved      OT SHORT TERM GOAL #4   Title  Pt will verbalize understanding of use of modalities to address edema, pain (such as heat and ice)     Status  Achieved        OT Long Term Goals - 07/12/19 1524      OT LONG TERM GOAL #1   Title  Pt will be mod I with gentle strengthing program to improve both grip and pinch strength in R hand  - 09/16/2019    Status  New      OT LONG TERM GOAL #2   Title  Pt will report pain no greater than 2/10 with more resistive activities such as cutting food on plate, cooking, picking up items, home mgmt tasks    Status  New      OT LONG TERM GOAL #3   Title  Pt will demonstrate ability to write 3 sentence pararaph with normal hand orienation (AE prn) with no greater than 2/10 pain    Status  New      OT LONG TERM GOAL #4   Title  Pt will demonstrate improve grip strength by at least 5 pounds to assist with home mgmt tasks    Status  New            Plan - 08/11/19 1534    Clinical Impression Statement  Patient with continued pain in index finger with end ranges, or with resistive activity - like pulling open dryer    OT Frequency  2x / week    OT Duration  8 weeks    OT Treatment/Interventions  Self-care/ADL training;Moist Heat;Fluidtherapy;Contrast Bath;Therapeutic exercise;Manual Therapy;Ultrasound;DME and/or AE instruction;Passive range of motion;Patient/family education;Therapeutic activities;Splinting    Plan  continue fluido, ultrasound, ROM and strength, ice prn    OT Home Exercise Plan  PIP/DIP blocking, flat and full fist    Consulted and Agree with Plan of Care  Patient       Patient will benefit from skilled therapeutic intervention in order to improve the following deficits and impairments:           Visit Diagnosis: Pain in right hand  Stiffness of right hand, not elsewhere classified  Muscle weakness (generalized)    Problem List Patient Active Problem List  Diagnosis Date Noted  . Lightheadedness 04/14/2019  . Allergic reaction 03/02/2019  . Cerebrovascular accident (CVA) due to occlusion of left middle cerebral artery ([place]) 01/08/2019  .  Psychophysiological insomnia 01/08/2019  . Menopausal vasomotor syndrome 01/08/2019  . OSA (obstructive sleep apnea) 01/08/2019  . Hot flashes 10/04/2017  . Stenosis of both middle cerebral arteries 07/07/2017  . Hyperlipidemia associated with type 2 diabetes mellitus ([place])   . Hx of completed stroke 07/01/2017  . Left shoulder pain 05/21/2016  . Routine general medical examination at a health care facility 10/13/2015  . Hypertension 08/26/2011  . Hx of laparoscopic gastric banding, 10/02/2010. 11/27/2010  . Diabetes mellitus type 2, uncontrolled ([place]) 08/23/2009  . Morbid obesity ([place]) 08/22/2009  . Allergic rhinitis 08/22/2009  . Asthma 08/22/2009  . ARTHRITIS 08/22/2009    Mariah Milling, OTR/L 08/11/2019, 3:40 PM  [facility] [address] [place], [place], [address] Phone: [phone]   Fax:  [phone]  Name: ANGELLYNN KIMBERLIN MRN: [id number] Date of Birth: [birth year]

## 2019-08-16 ENCOUNTER — Ambulatory Visit: Payer: No Typology Code available for payment source | Admitting: Occupational Therapy

## 2019-08-16 ENCOUNTER — Other Ambulatory Visit: Payer: Self-pay

## 2019-08-16 DIAGNOSIS — M79641 Pain in right hand: Secondary | ICD-10-CM

## 2019-08-16 DIAGNOSIS — M25641 Stiffness of right hand, not elsewhere classified: Secondary | ICD-10-CM

## 2019-08-16 DIAGNOSIS — M6281 Muscle weakness (generalized): Secondary | ICD-10-CM
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
## 2019-08-18 ENCOUNTER — Ambulatory Visit: Payer: No Typology Code available for payment source | Admitting: Occupational Therapy

## 2019-08-18 ENCOUNTER — Other Ambulatory Visit: Payer: Self-pay

## 2019-08-18 DIAGNOSIS — M79641 Pain in right hand: Secondary | ICD-10-CM | POA: Diagnosis not present

## 2019-08-18 DIAGNOSIS — M6281 Muscle weakness (generalized): Secondary | ICD-10-CM

## 2019-08-18 DIAGNOSIS — M25641 Stiffness of right hand, not elsewhere classified: Secondary | ICD-10-CM
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
## 2019-08-24 ENCOUNTER — Telehealth: Payer: Self-pay

## 2019-08-24 NOTE — Telephone Encounter (Signed)
Patient assistant form was faxed to [employer] on 08-06-2019 for medication Brilinta

## 2019-08-25 ENCOUNTER — Other Ambulatory Visit: Payer: Self-pay | Admitting: Radiology

## 2019-08-26 ENCOUNTER — Ambulatory Visit (HOSPITAL_COMMUNITY)
Admission: RE | Admit: 2019-08-26 | Discharge: 2019-08-26 | Disposition: A | Discharge status: Home / Self Care | Payer: Self-pay | Source: Ambulatory Visit | Attending: Interventional Radiology | Admitting: Interventional Radiology

## 2019-08-26 ENCOUNTER — Other Ambulatory Visit (HOSPITAL_COMMUNITY): Payer: Self-pay | Admitting: Interventional Radiology

## 2019-08-26 ENCOUNTER — Other Ambulatory Visit: Payer: Self-pay

## 2019-08-26 DIAGNOSIS — J45909 Unspecified asthma, uncomplicated: Secondary | ICD-10-CM | POA: Insufficient documentation

## 2019-08-26 DIAGNOSIS — I10 Essential (primary) hypertension: Secondary | ICD-10-CM | POA: Insufficient documentation

## 2019-08-26 DIAGNOSIS — Z823 Family history of stroke: Secondary | ICD-10-CM | POA: Insufficient documentation

## 2019-08-26 DIAGNOSIS — Z79899 Other long term (current) drug therapy: Secondary | ICD-10-CM | POA: Insufficient documentation

## 2019-08-26 DIAGNOSIS — Z8249 Family history of ischemic heart disease and other diseases of the circulatory system: Secondary | ICD-10-CM | POA: Insufficient documentation

## 2019-08-26 DIAGNOSIS — Z95828 Presence of other vascular implants and grafts: Secondary | ICD-10-CM | POA: Insufficient documentation

## 2019-08-26 DIAGNOSIS — E785 Hyperlipidemia, unspecified: Secondary | ICD-10-CM | POA: Insufficient documentation

## 2019-08-26 DIAGNOSIS — M1990 Unspecified osteoarthritis, unspecified site: Secondary | ICD-10-CM | POA: Insufficient documentation

## 2019-08-26 DIAGNOSIS — I771 Stricture of artery: Secondary | ICD-10-CM

## 2019-08-26 DIAGNOSIS — Z9884 Bariatric surgery status: Secondary | ICD-10-CM | POA: Insufficient documentation

## 2019-08-26 DIAGNOSIS — Z881 Allergy status to other antibiotic agents status: Secondary | ICD-10-CM | POA: Insufficient documentation

## 2019-08-26 DIAGNOSIS — Z7984 Long term (current) use of oral hypoglycemic drugs: Secondary | ICD-10-CM | POA: Insufficient documentation

## 2019-08-26 DIAGNOSIS — Z888 Allergy status to other drugs, medicaments and biological substances status: Secondary | ICD-10-CM | POA: Insufficient documentation

## 2019-08-26 DIAGNOSIS — E119 Type 2 diabetes mellitus without complications: Secondary | ICD-10-CM | POA: Insufficient documentation

## 2019-08-26 DIAGNOSIS — I6601 Occlusion and stenosis of right middle cerebral artery: Secondary | ICD-10-CM | POA: Insufficient documentation

## 2019-08-26 DIAGNOSIS — Z887 Allergy status to serum and vaccine status: Secondary | ICD-10-CM | POA: Insufficient documentation

## 2019-08-26 DIAGNOSIS — Z833 Family history of diabetes mellitus: Secondary | ICD-10-CM | POA: Insufficient documentation

## 2019-08-26 DIAGNOSIS — Z885 Allergy status to narcotic agent status: Secondary | ICD-10-CM | POA: Insufficient documentation

## 2019-08-26 DIAGNOSIS — Z8673 Personal history of transient ischemic attack (TIA), and cerebral infarction without residual deficits: Secondary | ICD-10-CM | POA: Insufficient documentation

## 2019-08-26 DIAGNOSIS — Z880 Allergy status to penicillin: Secondary | ICD-10-CM | POA: Insufficient documentation

## 2019-08-26 HISTORY — PX: IR ANGIO VERTEBRAL SEL VERTEBRAL BILAT MOD SED: IMG5369

## 2019-08-26 HISTORY — PX: IR US GUIDE VASC ACCESS RIGHT: IMG2390

## 2019-08-26 HISTORY — PX: IR ANGIO VERTEBRAL SEL VERTEBRAL UNI R MOD SED: IMG5368

## 2019-08-26 HISTORY — PX: IR ANGIO INTRA EXTRACRAN SEL COM CAROTID INNOMINATE UNI R MOD SED: IMG5359

## 2019-08-26 HISTORY — PX: IR ANGIO VERTEBRAL SEL SUBCLAVIAN INNOMINATE UNI L MOD SED: IMG5364

## 2019-08-26 LAB — PROTIME-INR
INR: 0.9 (ref 0.8–1.2)
Prothrombin Time: 11.8 seconds (ref 11.4–15.2)

## 2019-08-26 LAB — CBC
HCT: 43.3 % (ref 36.0–46.0)
Hemoglobin: 13.3 g/dL (ref 12.0–15.0)
MCH: 25.5 pg — ABNORMAL LOW (ref 26.0–34.0)
MCHC: 30.7 g/dL (ref 30.0–36.0)
MCV: 83.1 fL (ref 80.0–100.0)
Platelets: 295 10*3/uL (ref 150–400)
RBC: 5.21 MIL/uL — ABNORMAL HIGH (ref 3.87–5.11)
RDW: 12.8 % (ref 11.5–15.5)
WBC: 9.7 10*3/uL (ref 4.0–10.5)
nRBC: 0 % (ref 0.0–0.2)

## 2019-08-26 LAB — BASIC METABOLIC PANEL
Anion gap: 10 (ref 5–15)
BUN: 11 mg/dL (ref 6–20)
CO2: 24 mmol/L (ref 22–32)
Calcium: 9 mg/dL (ref 8.9–10.3)
Chloride: 107 mmol/L (ref 98–111)
Creatinine, Ser: 0.68 mg/dL (ref 0.44–1.00)
GFR calc Af Amer: >60 mL/min (ref >60)
GFR calc non Af Amer: >60 mL/min (ref >60)
Glucose, Bld: 226 mg/dL — ABNORMAL HIGH (ref 70–99)
Potassium: 3.5 mmol/L (ref 3.5–5.1)
Sodium: 141 mmol/L (ref 135–145)

## 2019-08-26 LAB — GLUCOSE, CAPILLARY: Glucose-Capillary: 230 mg/dL — ABNORMAL HIGH (ref 70–99)

## 2019-08-26 MED ORDER — VERAPAMIL HCL 2.5 MG/ML IV SOLN
INTRAVENOUS | Status: AC
  Filled 2019-08-26: qty 2

## 2019-08-26 MED ORDER — NITROGLYCERIN 1 MG/10 ML FOR IR/CATH LAB
INTRA_ARTERIAL | Status: AC
  Filled 2019-08-26: qty 10

## 2019-08-26 MED ORDER — HEPARIN SODIUM (PORCINE) 1000 UNIT/ML IJ SOLN
INTRAMUSCULAR | Status: AC
  Filled 2019-08-26: qty 1

## 2019-08-26 MED ORDER — LIDOCAINE HCL (PF) 1 % IJ SOLN
INTRAMUSCULAR | Status: AC | PRN
  Administered 2019-08-26: 5 mL

## 2019-08-26 MED ORDER — SODIUM CHLORIDE 0.9 % IV SOLN: Freq: Once | INTRAVENOUS | Status: DC

## 2019-08-26 MED ORDER — NITROGLYCERIN 1 MG/10 ML FOR IR/CATH LAB
INTRA_ARTERIAL | Status: AC | PRN
  Administered 2019-08-26 (×2): 200 ug via INTRA_ARTERIAL

## 2019-08-26 MED ORDER — IOHEXOL 300 MG/ML  SOLN
50.0000 mL | Freq: Once | INTRAMUSCULAR | Status: AC | PRN
  Administered 2019-08-26: 20 mL via INTRA_ARTERIAL

## 2019-08-26 MED ORDER — FENTANYL CITRATE (PF) 100 MCG/2ML IJ SOLN
INTRAMUSCULAR | Status: AC
  Filled 2019-08-26: qty 2

## 2019-08-26 MED ORDER — MIDAZOLAM HCL 2 MG/2ML IJ SOLN
INTRAMUSCULAR | Status: AC
  Filled 2019-08-26: qty 2

## 2019-08-26 MED ORDER — VERAPAMIL HCL 2.5 MG/ML IV SOLN
INTRAVENOUS | Status: AC | PRN
  Administered 2019-08-26: 2.5 mg via INTRA_ARTERIAL

## 2019-08-26 MED ORDER — LIDOCAINE HCL 1 % IJ SOLN
INTRAMUSCULAR | Status: AC
  Filled 2019-08-26: qty 20

## 2019-08-26 MED ORDER — IOHEXOL 300 MG/ML  SOLN
150.0000 mL | Freq: Once | INTRAMUSCULAR | Status: AC | PRN
  Administered 2019-08-26: 10:00:00 100 mL via INTRA_ARTERIAL

## 2019-08-26 MED ORDER — MIDAZOLAM HCL 2 MG/2ML IJ SOLN
INTRAMUSCULAR | Status: AC | PRN
  Administered 2019-08-26: 1 mg via INTRAVENOUS

## 2019-08-26 MED ORDER — FENTANYL CITRATE (PF) 100 MCG/2ML IJ SOLN
INTRAMUSCULAR | Status: AC | PRN
  Administered 2019-08-26: 25 ug via INTRAVENOUS

## 2019-08-26 MED ORDER — HEPARIN SODIUM (PORCINE) 1000 UNIT/ML IJ SOLN
INTRAMUSCULAR | Status: AC | PRN
  Administered 2019-08-26: 2000 [IU] via INTRA_ARTERIAL

## 2019-08-26 MED ORDER — SODIUM CHLORIDE 0.9 % IV SOLN
INTRAVENOUS | Status: AC | PRN
  Administered 2019-08-26: 75 mL/h via INTRAVENOUS

## 2019-08-26 NOTE — H&P (Signed)
Chief Complaint: Patient was seen in consultation today for a diagnostic cerebral angiogram.  Referring Physician(s): Antony Contras  Supervising Physician: Luanne Bras  Patient Status: [name] - Out-pt  History of Present Illness: Debra [name] is a 44 y.o. female with a past medical history significant for arthritis, asthma, OSA, endometriosis, HLD, HTN, DM and CVA who presents today for a diagnostic cerebral angiogram. [name] initially presented to the ED on 06/30/17 with slurred speech and expressive aphasia, workup showed a left MCA territory infarct and she underwent left MCA angioplasty and stent placement in NIR. She continued to have some intermittent aphasia but otherwise had no residual deficits. She continues on ASA 81 mg QD + Brilinta 45 mg BID per NIR recommendations. She has been followed by NIR with regular imaging with last CTA head/neck on 03/10/19 which was notable for unchanged intracranial atherosclerosis including severe distal right M1 stenosis and mild to moderate bilateral P1 stenoses, patent left MCA stent and unchanged minimal cervical carotid artery disease without stenosis. She presents today for a diagnostic angiogram to assess the noted severe M1 stenosis.  [name] denies any complaints today - she reports that her aphasia has resolved. She has not had any other neurologic complaints. She continues to take ASA 81 mg QD + Brilinta 45 mg BID without issue but is hopeful that she can come off of some of these medicines as it has been 2 years. She states understanding of the requested procedure and wishes to proceed as planned.  Past Medical History:  Diagnosis Date  . Allergic rhinitis, cause unspecified   . Arthritis   . Asthma   . Diabetes mellitus type II 08/2009 dx  . Endometriosis   . Hyperlipemia   . Hypertension   . Knee pain, right    DJD, post traumatic with repeat falls  . Lactose intolerance   . Morbid obesity (Debra [name])    s/p lab band 09/2010   . Osteoarthritis   . Ovarian cyst    right s/p resection June 2013  . Stroke Debra [name])     Past Surgical History:  Procedure Laterality Date  . ABDOMINAL HYSTERECTOMY  05/14/12   [place] [facility]  . IR ANGIO INTRA EXTRACRAN SEL COM CAROTID INNOMINATE BILAT MOD SED  07/04/2017  . IR ANGIO VERTEBRAL SEL VERTEBRAL BILAT MOD SED  07/04/2017  . IR INTRA CRAN STENT  07/07/2017  . IR RADIOLOGIST EVAL & MGMT  08/13/2017  . LAPAROSCOPIC GASTRIC BANDING  10/02/2010   start weight 349#  . RADIOLOGY WITH ANESTHESIA N/A 07/07/2017   Procedure: STENTING;  Surgeon: Luanne Bras, MD;  Location: [place];  Service: Radiology;  Laterality: N/A;  . SALPINGOOPHORECTOMY  10/08/11   right    Allergies: Nutritional supplements, Dust mite extract, Metformin and related, Morphine, Augmentin [amoxicillin-pot clavulanate], and Influenza vaccines  Medications: Prior to Admission medications   Medication Sig Start Date End Date Taking? Authorizing Provider  albuterol (PROAIR HFA) 108 (90 BASE) MCG/ACT inhaler Inhale 2 puffs into the lungs every 4 (four) hours as needed for wheezing or shortness of breath. 04/18/15  Yes Rowe Clack, MD  albuterol (PROVENTIL) (2.5 MG/3ML) 0.083% nebulizer solution Take 2.5 mg by nebulization every 6 (six) hours as needed for wheezing or shortness of breath.   Yes [provider]  amLODipine (NORVASC) 2.5 MG tablet Take 3 tablets (7.5 mg total) by mouth daily. 06/08/19  Yes Hoyt Koch, MD  blood glucose meter kit and supplies Dispense based on patient  and insurance preference. Use up to four times daily as directed. (FOR ICD-9 250.00, 250.01). 06/03/14  Yes Rowe Clack, MD  EPINEPHrine 0.3 mg/0.3 mL IJ SOAJ injection Inject 0.3 mLs (0.3 mg total) into the muscle as needed for anaphylaxis. 03/02/19  Yes Hoyt Koch, MD  fexofenadine (ALLEGRA) 180 MG tablet Take 180 mg by mouth daily.    Yes [provider]  fluticasone (FLONASE) 50  MCG/ACT nasal spray Place 2 sprays into both nostrils daily. Patient taking differently: Place 2 sprays into both nostrils daily as needed for allergies.  11/10/18  Yes Hassell Done, Mary-Margaret, FNP  glipiZIDE (GLUCOTROL) 10 MG tablet Take 1 tablet (10 mg total) by mouth 2 (two) times daily before a meal. 06/11/19  Yes Hoyt Koch, MD  glucose blood test strip Used to check blood sugars up to four times daily as needed 07/23/17  Yes Hoyt Koch, MD  Lancets MISC Used to check blood sugars up to four times daily as needed 07/23/17  Yes Hoyt Koch, MD  losartan (COZAAR) 100 MG tablet TAKE 1 TABLET(100 MG) BY MOUTH DAILY Patient taking differently: Take 100 mg by mouth daily.  06/08/19  Yes Hoyt Koch, MD  Menthol, Topical Analgesic, (BIOFREEZE EX) Apply 1 application topically daily as needed (pain).   Yes [provider]  olopatadine (PATANOL) 0.1 % ophthalmic solution Place 1 drop into both eyes 2 (two) times daily as needed for allergies.  08/08/12  Yes [provider]  rosuvastatin (CRESTOR) 40 MG tablet Take 1 tablet (40 mg total) by mouth daily. 06/08/19  Yes Hoyt Koch, MD  senna (SENOKOT) 8.6 MG TABS tablet Take 1 tablet by mouth daily.   Yes [provider]  sitaGLIPtin (JANUVIA) 100 MG tablet Take 1 tablet (100 mg total) by mouth daily. 06/11/19  Yes Hoyt Koch, MD  ticagrelor (BRILINTA) 90 MG TABS tablet Take 0.5 tablets (45 mg total) by mouth 2 (two) times daily. 08/03/19  Yes Hoyt Koch, MD  ibuprofen (ADVIL) 800 MG tablet Take 1 tablet (800 mg total) by mouth 3 (three) times daily. Patient not taking: Reported on 08/19/2019 04/16/19   Montine Circle, PA-C  montelukast (SINGULAIR) 10 MG tablet Take 1 tablet (10 mg total) by mouth at bedtime. Patient not taking: Reported on 08/19/2019 03/01/19   Hoyt Koch, MD     Family History  Problem Relation Age of Onset  . Heart disease Mother   .  Hypertension Mother   . Diabetes Mother   . Stroke Father 67  . Diabetes Father   . Hypertension Father   . Hyperlipidemia Father   . Heart disease Father   . Kidney disease Father   . Cancer Father   . Liver disease Father   . Obesity Father     Social History   Socioeconomic History  . Marital status: Single    Spouse name: n/a  . Number of children: 0  . Years of education: 12+  . Highest education level: Not on file  Occupational History  . Occupation: Retired  Tobacco Use  . Smoking status: Never Smoker  . Smokeless tobacco: Never Used  Substance and Sexual Activity  . Alcohol use: No    Alcohol/week: 0.0 standard drinks  . Drug use: No  . Sexual activity: Not on file  Other Topics Concern  . Not on file  Social History Narrative   Single, lives with mom.    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   Completed some college.   Social Determinants of Health   Financial Resource Strain:   . Difficulty of Paying Living Expenses:   Food Insecurity:   . Worried About Charity fundraiser in the Last Year:   . Arboriculturist in the Last Year:   Transportation Needs:   . Film/video editor (Medical):   [name] Lack of Transportation (Non-Medical):   Physical Activity:   . Days of Exercise per Week:   . Minutes of Exercise per Session:   Stress:   . Feeling of Stress :   Social Connections:   . Frequency of Communication with Friends and Family:   . Frequency of Social Gatherings with Friends and Family:   . Attends Religious Services:   . Active Member of Clubs or Organizations:   . Attends Archivist Meetings:   [name] Marital Status:      Review of Systems: A 12 point ROS discussed and pertinent positives are indicated in the HPI above.  All other systems are negative.  Review of Systems  Constitutional: Negative for chills and fever.  Respiratory: Negative for cough and  shortness of breath.   Cardiovascular: Negative for chest pain.  Gastrointestinal: Negative for abdominal pain, blood in stool, diarrhea, nausea and vomiting.  Genitourinary: Negative for dysuria and hematuria.  Musculoskeletal: Negative for back pain.  Skin: Negative for wound.  Neurological: Negative for dizziness and headaches.    Vital Signs: BP (!) 152/93   Pulse 69   Temp 98.2 F (36.8 C) (Oral)   Resp 19   Ht '5\' 8"'  (1.727 m)   Wt (!) 325 lb (147.4 kg)   LMP 07/28/2011   SpO2 100%   BMI 49.42 kg/m   Physical Exam Vitals reviewed.  HENT:     Head: Normocephalic.     Mouth/Throat:     Mouth: Mucous membranes are moist.     Pharynx: Oropharynx is clear. No oropharyngeal exudate or posterior oropharyngeal erythema.  Cardiovascular:     Rate and Rhythm: Normal rate and regular rhythm.  Pulmonary:     Effort: Pulmonary effort is normal.     Breath sounds: Normal breath sounds.  Abdominal:     General: Bowel sounds are normal. There is no distension.     Palpations: Abdomen is soft.     Tenderness: There is no abdominal tenderness.  Skin:    General: Skin is warm and dry.  Neurological:     Mental Status: She is alert and oriented to person, place, and time.     Cranial Nerves: No cranial nerve deficit.     Motor: No weakness.     Gait: Gait normal.  Psychiatric:        Mood and Affect: Mood normal.        Behavior: Behavior normal.        Thought Content: Thought content normal.        Judgment: Judgment normal.      MD Evaluation Airway: WNL Heart: WNL Abdomen: WNL Chest/ Lungs: WNL ASA  Classification: 2 Mallampati/Airway Score: Two   Imaging: No results found.  Labs:  CBC: Recent Labs    04/16/19 2014  WBC 11.2*  HGB 14.1  HCT 45.9  PLT 331    COAGS: Recent Labs    04/16/19 2014  INR 1.0    BMP: Recent Labs    04/14/19 1140  04/16/19 2014  NA 141 138  K 4.4 3.6  CL 105 104  CO2 27 24  GLUCOSE 222* 189*  BUN 9 8  CALCIUM  9.0 9.2  CREATININE 0.81 0.80  GFRNONAA  --  >60  GFRAA  --  >60    LIVER FUNCTION TESTS: Recent Labs    04/14/19 1140  BILITOT 0.3  AST 13  ALT 18  ALKPHOS 99  PROT 6.0  ALBUMIN 3.7    TUMOR MARKERS: No results for input(s): AFPTM, CEA, CA199, CHROMGRNA in the last 8760 hours.  Assessment and Plan:  44 y/o F with history of left MCA infarct s/p angioplasty and stent placement 06/30/17 in [facility] who presents today for a diagnostic cerebral angiogram to assess recent CTA findings including severe right M1 stenosis.   Patient has been NPO since midnight, she continues on ASA 81 mg QD + Brilinta 45 mg BID. Afebrile, WBC 9.7, hgb 13.3, plt 295, INR 1.0, creatinine 0.68.  Risks and benefits of diagnostic cerebral angiogram were discussed with the patient including, but not limited to bleeding, infection, vascular injury, stroke, or contrast induced renal failure.  This interventional procedure involves the use of X-rays and because of the nature of the planned procedure, it is possible that we will have prolonged use of X-ray fluoroscopy. Potential radiation risks to you include (but are not limited to) the following: - A slightly elevated risk for cancer  several years later in life. This risk is typically less than 0.5% percent. This risk is low in comparison to the normal incidence of human cancer, which is 33% for women and 50% for men according to the [place]. - Radiation induced injury can include skin redness, resembling a rash, tissue breakdown / ulcers and hair loss (which can be temporary or permanent).   The likelihood of either of these occurring depends on the difficulty of the procedure and whether you are sensitive to radiation due to previous procedures, disease, or genetic conditions.  IF your procedure requires a prolonged use of radiation, you will be notified and given written instructions for further action.  It is your responsibility to monitor the  irradiated area for the 2 weeks following the procedure and to notify your physician if you are concerned that you have suffered a radiation induced injury.    All of the patient's questions were answered, patient is agreeable to proceed.  Consent signed and in chart.  Thank you for this interesting consult.  I greatly enjoyed meeting AILAH BARNA and look forward to participating in their care.  A copy of this report was sent to the requesting provider on this date.  Electronically Signed: Joaquim Nam, PA-C 08/26/2019, 7:16 AM   I spent a total of 25 Minutes in face to face in clinical consultation, greater than 50% of which was counseling/coordinating care for diagnostic cerebral angiogram.

## 2019-08-26 NOTE — Discharge Instructions (Signed)
Radial Site Care  This sheet gives you information about how to care for yourself after your procedure. Your health care provider may also give you more specific instructions. If you have problems or questions, contact your health care provider. What can I expect after the procedure? After the procedure, it is common to have:  Bruising and tenderness at the catheter insertion area. Follow these instructions at home: Medicines  Take over-the-counter and prescription medicines only as told by your health care provider. Insertion site care  Follow instructions from your health care provider about how to take care of your insertion site. Make sure you: ? Wash your hands with soap and water before you change your bandage (dressing). If soap and water are not available, use hand sanitizer. ? Change your dressing as told by your health care provider. ? Leave stitches (sutures), skin glue, or adhesive strips in place. These skin closures may need to stay in place for 2 weeks or longer. If adhesive strip edges start to loosen and curl up, you may trim the loose edges. Do not remove adhesive strips completely unless your health care provider tells you to do that.  Check your insertion site every day for signs of infection. Check for: ? Redness, swelling, or pain. ? Fluid or blood. ? Pus or a bad smell. ? Warmth.  Do not take baths, swim, or use a hot tub until your health care provider approves.  You may shower 24-48 hours after the procedure, or as directed by your health care provider. ? Remove the dressing and gently wash the site with plain soap and water. ? Pat the area dry with a clean towel. ? Do not rub the site. That could cause bleeding.  Do not apply powder or lotion to the site. Activity   For 24 hours after the procedure, or as directed by your health care provider: ? Do not flex or bend the affected arm. ? Do not push or pull heavy objects with the affected arm. ? Do not  drive yourself home from the hospital or clinic. You may drive 24 hours after the procedure unless your health care provider tells you not to. ? Do not operate machinery or power tools.  Do not lift anything that is heavier than 10 lb (4.5 kg), or the limit that you are told, until your health care provider says that it is safe.  Ask your health care provider when it is okay to: ? Return to work or school. ? Resume usual physical activities or sports. ? Resume sexual activity. General instructions  If the catheter site starts to bleed, raise your arm and put firm pressure on the site. If the bleeding does not stop, get help right away. This is a medical emergency.  If you went home on the same day as your procedure, a responsible adult should be with you for the first 24 hours after you arrive home.  Keep all follow-up visits as told by your health care provider. This is important. Contact a health care provider if:  You have a fever.  You have redness, swelling, or yellow drainage around your insertion site. Get help right away if:  You have unusual pain at the radial site.  The catheter insertion area swells very fast.  The insertion area is bleeding, and the bleeding does not stop when you hold steady pressure on the area.  Your arm or hand becomes pale, cool, tingly, or numb. These symptoms may represent a serious problem   that is an emergency. Do not wait to see if the symptoms will go away. Get medical help right away. Call your local emergency services (911 in the [place] Do not drive yourself to the hospital. Summary  After the procedure, it is common to have bruising and tenderness at the site.  Follow instructions from your health care provider about how to take care of your radial site wound. Check the wound every day for signs of infection.  Do not lift anything that is heavier than 10 lb (4.5 kg), or the limit that you are told, until your health care provider says  that it is safe. This information is not intended to replace advice given to you by your health care provider. Make sure you discuss any questions you have with your health care provider. Document Revised: 05/14/2017 Document Reviewed: 05/14/2017 Elsevier Patient Education  2020 Elsevier Inc.  

## 2019-08-26 NOTE — Procedures (Signed)
S/P bilateral VA and RT CCA angiograms. RT rad approach. Findingd.  1.Approx 80 % stenosis RT MCA M 1 seg. S.Orra Nolde MD

## 2019-08-26 NOTE — Progress Notes (Signed)
TR BAND REMOVAL  LOCATION:    right radial  DEFLATED PER PROTOCOL:    Yes.    TIME BAND OFF / DRESSING APPLIED:    1245   SITE UPON ARRIVAL:    Level 0  SITE AFTER BAND REMOVAL:    Level 0  CIRCULATION SENSATION AND MOVEMENT:    Within Normal Limits   Yes.    COMMENTS:   TRB removed/ tegaderm dsg applied

## 2019-08-30 ENCOUNTER — Ambulatory Visit: Payer: BC Managed Care – PPO | Attending: Family Medicine | Admitting: Occupational Therapy

## 2019-08-30 ENCOUNTER — Other Ambulatory Visit: Payer: Self-pay

## 2019-08-30 DIAGNOSIS — M25641 Stiffness of right hand, not elsewhere classified: Secondary | ICD-10-CM | POA: Diagnosis not present

## 2019-08-30 DIAGNOSIS — M6281 Muscle weakness (generalized): Secondary | ICD-10-CM | POA: Diagnosis not present

## 2019-08-30 DIAGNOSIS — M79641 Pain in right hand: Secondary | ICD-10-CM | POA: Insufficient documentation

## 2019-08-30 NOTE — Therapy (Signed)
[facility] [address] [place], [place], [address] Phone: [phone]   Fax:  [phone]  Occupational Therapy Treatment  Patient Details  Name: Debra [name] MRN: [id number] Date of Birth: 1975-10-30 Referring Provider (OT): Dr. Clementeen Graham   Encounter Date: 08/30/2019  OT End of Session - 08/30/19 1313    Visit Number  8    Number of Visits  16    Authorization Type  self pay    OT Start Time  1237    OT Stop Time  1315    OT Time Calculation (min)  38 min    Activity Tolerance  Patient tolerated treatment well    Behavior During Therapy  [facility] for tasks assessed/performed       Past Medical History:  Diagnosis Date  . Allergic rhinitis, cause unspecified   . Arthritis   . Asthma   . Diabetes mellitus type II 08/2009 dx  . Endometriosis   . Hyperlipemia   . Hypertension   . Knee pain, right    DJD, post traumatic with repeat falls  . Lactose intolerance   . Morbid obesity (HCC)    s/p lab band 09/2010  . Osteoarthritis   . Ovarian cyst    right s/p resection June 2013  . Stroke [facility])     Past Surgical History:  Procedure Laterality Date  . ABDOMINAL HYSTERECTOMY  05/14/12   [place]  . IR ANGIO INTRA EXTRACRAN SEL COM CAROTID INNOMINATE BILAT MOD SED  07/04/2017  . IR ANGIO INTRA EXTRACRAN SEL COM CAROTID INNOMINATE UNI R MOD SED  08/26/2019  . IR ANGIO VERTEBRAL SEL SUBCLAVIAN INNOMINATE UNI L MOD SED  08/26/2019  . IR ANGIO VERTEBRAL SEL VERTEBRAL BILAT MOD SED  07/04/2017  . IR ANGIO VERTEBRAL SEL VERTEBRAL UNI R MOD SED  08/26/2019  . IR INTRA CRAN STENT  07/07/2017  . IR RADIOLOGIST EVAL & MGMT  08/13/2017  . IR US GUIDE VASC ACCESS RIGHT  08/26/2019  . LAPAROSCOPIC GASTRIC BANDING  10/02/2010   start weight 349#  . RADIOLOGY WITH ANESTHESIA N/A 07/07/2017   Procedure: STENTING;  Surgeon: Julieanne Cotton, MD;  Location[facility];  Service: Radiology;  Laterality: N/A;  . SALPINGOOPHORECTOMY   10/08/11   right    There were no vitals filed for this visit.  Subjective Assessment - 08/30/19 1244    Subjective   I had an angiogram last week - little incision in right wrist    Currently in Pain?  No/denies    Pain Score  0-No pain                   OT Treatments/Exercises (OP) - 08/30/19 0001      Hand Exercises   Other Hand Exercises  Reviewed her ability to don stretching strap.  Patient unbale to don strap prior to this visit.  Reviewed paractice and patient demonstrated ability to don - plans to build tolerance to 5 minutes - discussed benefit of applying after warm heat - shower or washing dishes    Other Hand Exercises  Working to keep consistent pressure on tweezers to manipulate small pins.  Patient with frequent dropping, required cueing to maintain pressure then adjust slightly to change orientation      Manual Therapy   Manual Therapy  Soft tissue mobilization    Manual therapy comments  Right index finger    Soft tissue mobilization  between PIP and DIP, and proximal to  PIP.  mild edema noted over volar surface of PIP             OT Education - 08/30/19 1312    Education Details  Use heat before stretching strap    Person(s) Educated  Patient    Methods  Explanation;Demonstration    Comprehension  Verbalized understanding       OT Short Term Goals - 08/11/19 1536      OT SHORT TERM GOAL #1   Title  Pt will be mod I with HEP to address ROM/stiffness and pain in R hand - 08/09/2019    Status  Achieved      OT SHORT TERM GOAL #2   Title  Pt will demonstrate full composite flexion in R hand with no more than 2/10 pain (no resistance just ROM) to assist with light grasping activities    Status  Achieved      OT SHORT TERM GOAL #3   Title  Pt will report pain no greater than 2/10 with functional activities requiring little resistance (such as driving, typing, buttoning, zipping,    Status  Achieved      OT SHORT TERM GOAL #4   Title  Pt  will verbalize understanding of use of modalities to address edema, pain (such as heat and ice)    Status  Achieved        OT Long Term Goals - 08/16/19 1046      OT LONG TERM GOAL #1   Title  Pt will be mod I with gentle strengthing program to improve both grip and pinch strength in R hand  - 09/16/2019    Status  Achieved      OT LONG TERM GOAL #2   Title  Pt will report pain no greater than 2/10 with more resistive activities such as cutting food on plate, cooking, picking up items, home mgmt tasks    Status  On-going      OT LONG TERM GOAL #3   Title  Pt will demonstrate ability to write 3 sentence pararaph with normal hand orienation (AE prn) with no greater than 2/10 pain    Status  On-going      OT LONG TERM GOAL #4   Title  Pt will demonstrate improve grip strength by at least 5 pounds to assist with home mgmt tasks    Status  On-going            Plan - 08/30/19 1313    Clinical Impression Statement  Patient with continued improvement in pain and range of motion.  Had recent angiogram (through volar wrist) so slightly more stiffness noted today.  Discussed discharge after next scheduled visits.    OT Frequency  2x / week    OT Duration  8 weeks    OT Treatment/Interventions  Self-care/ADL training;Moist Heat;Fluidtherapy;Contrast Bath;Therapeutic exercise;Manual Therapy;Ultrasound;DME and/or AE instruction;Passive range of motion;Patient/family education;Therapeutic activities;Splinting    Plan  continue fluido, ROM, strength, check grip strength and writing goal    OT Home Exercise Plan  PIP/DIP blocking, flat and full fist    Consulted and Agree with Plan of Care  Patient       Patient will benefit from skilled therapeutic intervention in order to improve the following deficits and impairments:           Visit Diagnosis: Stiffness of right hand, not elsewhere classified  Muscle weakness (generalized)  Pain in right hand    Problem List Patient Active  Problem List  Diagnosis Date Noted  . Lightheadedness 04/14/2019  . Allergic reaction 03/02/2019  . Cerebrovascular accident (CVA) due to occlusion of left middle cerebral artery (HCC) 01/08/2019  . Psychophysiological insomnia 01/08/2019  . Menopausal vasomotor syndrome 01/08/2019  . OSA (obstructive sleep apnea) 01/08/2019  . Hot flashes 10/04/2017  . Stenosis of both middle cerebral arteries 07/07/2017  . Hyperlipidemia associated with type 2 diabetes mellitus (HCC)   . Hx of completed stroke 07/01/2017  . Left shoulder pain 05/21/2016  . Routine general medical examination at a health care facility 10/13/2015  . Hypertension 08/26/2011  . Hx of laparoscopic gastric banding, 10/02/2010. 11/27/2010  . Diabetes mellitus type 2, uncontrolled (HCC) 08/23/2009  . Morbid obesity (HCC) 08/22/2009  . Allergic rhinitis 08/22/2009  . Asthma 08/22/2009  . ARTHRITIS 08/22/2009    Collier Salina, OTR/L 08/30/2019, 1:17 PM  [facility] [address] [place], [place], [address] Phone: [phone]   Fax:  [phone]  Name: ZARAYAH LANTING MRN: [id number] Date of Birth: 1975/12/02

## 2019-08-30 NOTE — Telephone Encounter (Signed)
New message:   Pt is calling and states the way the prescription for the Astra Zenaca is wrote is still wrong. She states it is 11 refills with a odd number of pills. She states it needs to say 11 refills with a 30 day supply. Please advise.

## 2019-08-31 ENCOUNTER — Other Ambulatory Visit (HOSPITAL_COMMUNITY): Payer: Self-pay | Admitting: Interventional Radiology

## 2019-08-31 ENCOUNTER — Encounter (HOSPITAL_COMMUNITY): Payer: Self-pay

## 2019-08-31 DIAGNOSIS — I771 Stricture of artery: Secondary | ICD-10-CM

## 2019-08-31 NOTE — Telephone Encounter (Signed)
Please advise 

## 2019-08-31 NOTE — Telephone Encounter (Signed)
The medication which was faxed in with form was for a 30 day supply with 11 refills. I am not sure how to help with this further. #30 with 11 refills is a 30 day supply with 11 refills.

## 2019-09-07 ENCOUNTER — Telehealth: Payer: Self-pay

## 2019-09-07 NOTE — Telephone Encounter (Signed)
Clarification is needed for Brilinta with the quality of supply.   Patient assistant is unable to move forward without clarification from doctor

## 2019-09-07 NOTE — Telephone Encounter (Signed)
Refaxed prescription to [facility] with clarification of prescription

## 2019-09-07 NOTE — Telephone Encounter (Signed)
Thanks

## 2019-09-07 NOTE — Telephone Encounter (Signed)
New message    The patient calling regarding ticagrelor (BRILINTA) 90 MG TABS tablet,   The patient is aware of Dr. Frutoso Chase previous message on 5.4.2021.   [employer]  Program called [place]  Customer Services phone # [phone]  Asking can someone to call customer services to handle this situation feels like the middle person in this.

## 2019-09-07 NOTE — Telephone Encounter (Signed)
See prior message. I do not know what is needed from me.

## 2019-09-08 ENCOUNTER — Ambulatory Visit: Payer: BC Managed Care – PPO | Admitting: Occupational Therapy

## 2019-09-10 ENCOUNTER — Ambulatory Visit: Payer: BC Managed Care – PPO | Admitting: Occupational Therapy

## 2019-09-10 ENCOUNTER — Encounter: Payer: Self-pay | Admitting: Occupational Therapy

## 2019-09-10 ENCOUNTER — Other Ambulatory Visit: Payer: Self-pay

## 2019-09-10 DIAGNOSIS — M25641 Stiffness of right hand, not elsewhere classified: Secondary | ICD-10-CM | POA: Diagnosis not present

## 2019-09-10 DIAGNOSIS — M6281 Muscle weakness (generalized): Secondary | ICD-10-CM

## 2019-09-10 DIAGNOSIS — M79641 Pain in right hand: Secondary | ICD-10-CM | POA: Diagnosis not present

## 2019-09-10 NOTE — Therapy (Signed)
[facility] [address] [place], [place], [address] Phone: [phone]   Fax:  [phone]  Occupational Therapy Treatment  Patient Details  Name: Debra [name] MRN: [id number] Date of Birth: 11/27/1975 Referring Provider (OT): Dr. Lynne Leader   Encounter Date: 09/10/2019  OT End of Session - 09/10/19 1117    Visit Number  9    Number of Visits  16    Authorization Type  self pay    OT Start Time  1107    OT Stop Time  1145    OT Time Calculation (min)  38 min    Activity Tolerance  Patient tolerated treatment well    Behavior During Therapy  [facility] for tasks assessed/performed       Past Medical History:  Diagnosis Date  . Allergic rhinitis, cause unspecified   . Arthritis   . Asthma   . Diabetes mellitus type II 08/2009 dx  . Endometriosis   . Hyperlipemia   . Hypertension   . Knee pain, right    DJD, post traumatic with repeat falls  . Lactose intolerance   . Morbid obesity ([place])    s/p lab band 09/2010  . Osteoarthritis   . Ovarian cyst    right s/p resection June 2013  . Stroke [facility])     Past Surgical History:  Procedure Laterality Date  . ABDOMINAL HYSTERECTOMY  05/14/12   [place]  . IR ANGIO INTRA EXTRACRAN SEL COM CAROTID INNOMINATE BILAT MOD SED  07/04/2017  . IR ANGIO INTRA EXTRACRAN SEL COM CAROTID INNOMINATE UNI R MOD SED  08/26/2019  . IR ANGIO VERTEBRAL SEL VERTEBRAL BILAT MOD SED  07/04/2017  . IR ANGIO VERTEBRAL SEL VERTEBRAL BILAT MOD SED  08/26/2019  . IR INTRA CRAN STENT  07/07/2017  . IR RADIOLOGIST EVAL & MGMT  08/13/2017  . IR US GUIDE VASC ACCESS RIGHT  08/26/2019  . LAPAROSCOPIC GASTRIC BANDING  10/02/2010   start weight 349#  . RADIOLOGY WITH ANESTHESIA N/A 07/07/2017   Procedure: STENTING;  Surgeon: Luanne Bras, MD;  Location: [facility];  Service: Radiology;  Laterality: N/A;  . SALPINGOOPHORECTOMY  10/08/11   right    There were no vitals filed for this  visit.  Subjective Assessment - 09/10/19 1116    Subjective   I had an angiogram last week - little incision in right wrist    Currently in Pain?  No/denies            Treatment: Fluidotherapy x 10 mins to LUE for stiffness, no adverse reactions.Rt index PIP flex/ext, DIP flex/ext, followed by composite flex/ext  And passive stretch in composite flexion, Therapist fabricated a second stretch loop  For composite flexion stretch with elastic, pt was able to tolerate x 1 min. Pt was instructed to wear for 1 min several times per day at home yet never longer than 5 mins. Graded clothespins for sustained pinch, pt reports improved performance. Therapist checked goals, pt agrees with d/c.                                              OT Education - 09/10/19 1142    Education Details  Reveiwed progress towards goals and plans for d/c, pt agrees. Pt was encouraged to wear strteching strap for 1 min several times a day, but never longer than  5 mins.    Person(s) Educated  Patient    Methods  Explanation;Demonstration    Comprehension  Verbalized understanding       OT Short Term Goals - 08/11/19 1536      OT SHORT TERM GOAL #1   Title  Pt will be mod I with HEP to address ROM/stiffness and pain in R hand - 08/09/2019    Status  Achieved      OT SHORT TERM GOAL #2   Title  Pt will demonstrate full composite flexion in R hand with no more than 2/10 pain (no resistance just ROM) to assist with light grasping activities    Status  Achieved      OT SHORT TERM GOAL #3   Title  Pt will report pain no greater than 2/10 with functional activities requiring little resistance (such as driving, typing, buttoning, zipping,    Status  Achieved      OT SHORT TERM GOAL #4   Title  Pt will verbalize understanding of use of modalities to address edema, pain (such as heat and ice)    Status  Achieved        OT Long Term Goals - 09/10/19 1138      OT LONG TERM  GOAL #1   Title  Pt will be mod I with gentle strengthing program to improve both grip and pinch strength in R hand  - 09/16/2019    Status  Achieved      OT LONG TERM GOAL #2   Title  Pt will report pain no greater than 2/10 with more resistive activities such as cutting food on plate, cooking, picking up items, home mgmt tasks    Status  Not Met   3/10, improved but not met     OT LONG TERM GOAL #3   Title  Pt will demonstrate ability to write 3 sentence pararaph with normal hand orienation (AE prn) with no greater than 2/10 pain    Status  Achieved      OT LONG TERM GOAL #4   Title  Pt will demonstrate improve grip strength by at least 5 pounds to assist with home mgmt tasks    Status  Achieved   48.7 lbs           Plan - 09/10/19 1143    Clinical Impression Statement  Pt dmeonstrates good overall progress. She agrees with plans for d/c.    Occupational performance deficits (Please refer to evaluation for details):  ADL's;IADL's;Leisure    Body Structure / Function / Physical Skills  ADL;IADL;ROM;Pain;Strength;UE functional use    Rehab Potential  Good    OT Frequency  2x / week    OT Duration  8 weeks    OT Treatment/Interventions  Self-care/ADL training;Moist Heat;Fluidtherapy;Contrast Bath;Therapeutic exercise;Manual Therapy;Ultrasound;DME and/or AE instruction;Passive range of motion;Patient/family education;Therapeutic activities;Splinting    Plan  d/c    Consulted and Agree with Plan of Care  Patient       Patient will benefit from skilled therapeutic intervention in order to improve the following deficits and impairments:   Body Structure / Function / Physical Skills: ADL, IADL, ROM, Pain, Strength, UE functional use       Visit Diagnosis: Stiffness of right hand, not elsewhere classified  Muscle weakness (generalized)  Pain in right hand    Problem List Patient Active Problem List   Diagnosis Date Noted  . Lightheadedness 04/14/2019  . Allergic  reaction 03/02/2019  . Cerebrovascular accident (CVA) due  to occlusion of left middle cerebral artery ([place]) 01/08/2019  . Psychophysiological insomnia 01/08/2019  . Menopausal vasomotor syndrome 01/08/2019  . OSA (obstructive sleep apnea) 01/08/2019  . Hot flashes 10/04/2017  . Stenosis of both middle cerebral arteries 07/07/2017  . Hyperlipidemia associated with type 2 diabetes mellitus ([place])   . Hx of completed stroke 07/01/2017  . Left shoulder pain 05/21/2016  . Routine general medical examination at a health care facility 10/13/2015  . Hypertension 08/26/2011  . Hx of laparoscopic gastric banding, 10/02/2010. 11/27/2010  . Diabetes mellitus type 2, uncontrolled ([place]) 08/23/2009  . Morbid obesity ([place]) 08/22/2009  . Allergic rhinitis 08/22/2009  . Asthma 08/22/2009  . ARTHRITIS 08/22/2009  OCCUPATIONAL THERAPY DISCHARGE SUMMARY    Current functional level related to goals / functional outcomes: Pt made good overall progress, however she did not achieve all goals due to pain.   Remaining deficits: Decreased ROM, pain   Education / Equipment: Pt was instructed in HEP and flexion band loop wear, and prec. Pt verbalized understanding of all education. Plan: Patient agrees to discharge.  Patient goals were partially met. Patient is being discharged due to being pleased with the current functional level.  ?????      Curtisha Bendix 09/10/2019, 1:11 PM Theone Murdoch, OTR/L [phone] Phone: [phone] 1:11 PM 09/10/19 [facility] [address] [place], [place], [address] Phone: [phone]   Fax:  [phone]  Name: Debra [name] MRN: [id number] Date of Birth: 1975/05/28

## 2019-10-21 DIAGNOSIS — G4733 Obstructive sleep apnea (adult) (pediatric): Secondary | ICD-10-CM | POA: Diagnosis not present

## 2019-11-01 ENCOUNTER — Telehealth: Payer: Self-pay | Admitting: Family Medicine

## 2019-11-01 NOTE — Telephone Encounter (Signed)
Pt called needing to discuss her ticagrelor (BRILINTA) 90 MG TABS tablet with RN. Please advise.

## 2019-11-01 NOTE — Telephone Encounter (Signed)
I called [employer] [place] PAP program after speaking to pt.  She states she has been approved for taking the brilinta 45mg  po bid since March 2021 by PAP,  but issue of the way prescription is written for 30 tabs po bid (has placed process on hold).  I spoke to April 2021 with PAP program [phone] and she will fax to pcp, Dr. [phone] (I confirmed the fax #) a off label certificate (relating to the  dosing of medication (specific for pt).  I relayed to pt and she will follow up on this.  I will forward this message to Dr. Okey Dupre as well.

## 2019-11-03 ENCOUNTER — Telehealth: Payer: Self-pay | Admitting: Student

## 2019-11-03 NOTE — Telephone Encounter (Signed)
NIR.  Called prescription for Brilinta into [phone]) andleft VM to fill prescription- Brilitna 90 mg tablets, take 1/2 tablet by mouth twice daily, dispense 14 tablets with 0 refills.   Waylan Boga Vasilia Dise, PA-C 11/03/2019, 2:07 PM

## 2019-11-22 DIAGNOSIS — G4733 Obstructive sleep apnea (adult) (pediatric): Secondary | ICD-10-CM | POA: Diagnosis not present

## 2019-11-29 ENCOUNTER — Other Ambulatory Visit: Payer: Self-pay

## 2019-11-29 DIAGNOSIS — Z8673 Personal history of transient ischemic attack (TIA), and cerebral infarction without residual deficits: Secondary | ICD-10-CM | POA: Insufficient documentation

## 2019-11-29 DIAGNOSIS — J45909 Unspecified asthma, uncomplicated: Secondary | ICD-10-CM | POA: Diagnosis not present

## 2019-11-29 DIAGNOSIS — Z79899 Other long term (current) drug therapy: Secondary | ICD-10-CM | POA: Diagnosis not present

## 2019-11-29 DIAGNOSIS — J069 Acute upper respiratory infection, unspecified: Secondary | ICD-10-CM | POA: Diagnosis not present

## 2019-11-29 DIAGNOSIS — Z7951 Long term (current) use of inhaled steroids: Secondary | ICD-10-CM | POA: Insufficient documentation

## 2019-11-29 DIAGNOSIS — Z794 Long term (current) use of insulin: Secondary | ICD-10-CM | POA: Diagnosis not present

## 2019-11-29 DIAGNOSIS — R0602 Shortness of breath: Secondary | ICD-10-CM | POA: Diagnosis not present

## 2019-11-29 DIAGNOSIS — E119 Type 2 diabetes mellitus without complications: Secondary | ICD-10-CM | POA: Diagnosis not present

## 2019-11-29 DIAGNOSIS — Z20822 Contact with and (suspected) exposure to covid-19: Secondary | ICD-10-CM | POA: Diagnosis not present

## 2019-11-29 DIAGNOSIS — R509 Fever, unspecified: Secondary | ICD-10-CM | POA: Diagnosis not present

## 2019-11-29 DIAGNOSIS — B9789 Other viral agents as the cause of diseases classified elsewhere: Secondary | ICD-10-CM | POA: Diagnosis not present

## 2019-11-30 ENCOUNTER — Emergency Department (HOSPITAL_COMMUNITY)
Admission: EM | Admit: 2019-11-30 | Discharge: 2019-11-30 | Disposition: A | Discharge status: Home / Self Care | Payer: BC Managed Care – PPO | Attending: Emergency Medicine | Admitting: Emergency Medicine

## 2019-11-30 ENCOUNTER — Other Ambulatory Visit: Payer: Self-pay

## 2019-11-30 ENCOUNTER — Emergency Department (HOSPITAL_COMMUNITY): Payer: BC Managed Care – PPO

## 2019-11-30 DIAGNOSIS — J069 Acute upper respiratory infection, unspecified: Secondary | ICD-10-CM

## 2019-11-30 DIAGNOSIS — R509 Fever, unspecified: Secondary | ICD-10-CM | POA: Diagnosis not present

## 2019-11-30 DIAGNOSIS — R0602 Shortness of breath: Secondary | ICD-10-CM | POA: Diagnosis not present

## 2019-11-30 LAB — BASIC METABOLIC PANEL
Anion gap: 12 (ref 5–15)
BUN: 7 mg/dL (ref 6–20)
CO2: 25 mmol/L (ref 22–32)
Calcium: 9.2 mg/dL (ref 8.9–10.3)
Chloride: 98 mmol/L (ref 98–111)
Creatinine, Ser: 0.83 mg/dL (ref 0.44–1.00)
GFR calc Af Amer: >60 mL/min (ref >60)
GFR calc non Af Amer: >60 mL/min (ref >60)
Glucose, Bld: 357 mg/dL — ABNORMAL HIGH (ref 70–99)
Potassium: 4.2 mmol/L (ref 3.5–5.1)
Sodium: 135 mmol/L (ref 135–145)

## 2019-11-30 LAB — CBC
HCT: 43 % (ref 36.0–46.0)
Hemoglobin: 13.2 g/dL (ref 12.0–15.0)
MCH: 25.7 pg — ABNORMAL LOW (ref 26.0–34.0)
MCHC: 30.7 g/dL (ref 30.0–36.0)
MCV: 83.7 fL (ref 80.0–100.0)
Platelets: 257 10*3/uL (ref 150–400)
RBC: 5.14 MIL/uL — ABNORMAL HIGH (ref 3.87–5.11)
RDW: 13.1 % (ref 11.5–15.5)
WBC: 7.5 10*3/uL (ref 4.0–10.5)
nRBC: 0 % (ref 0.0–0.2)

## 2019-11-30 LAB — SARS CORONAVIRUS 2 BY RT PCR (HOSPITAL ORDER, PERFORMED IN ~~LOC~~ HOSPITAL LAB): SARS Coronavirus 2: NEGATIVE

## 2019-11-30 LAB — TROPONIN I (HIGH SENSITIVITY)
Troponin I (High Sensitivity): 7 ng/L (ref <18)
Troponin I (High Sensitivity): 6 ng/L (ref <18)

## 2019-11-30 MED ORDER — ACETAMINOPHEN 325 MG PO TABS
650.0000 mg | ORAL_TABLET | Freq: Once | ORAL | Status: AC | PRN
  Administered 2019-11-30: 650 mg via ORAL
  Filled 2019-11-30: qty 2

## 2019-11-30 MED ORDER — BENZONATATE 100 MG PO CAPS
100.0000 mg | ORAL_CAPSULE | Freq: Three times a day (TID) | ORAL | 0 refills | Status: DC
Start: 2019-11-30 — End: 2019-12-15

## 2019-11-30 MED ORDER — ACETAMINOPHEN 325 MG PO TABS
650.0000 mg | ORAL_TABLET | Freq: Once | ORAL | Status: AC | PRN
  Administered 2019-11-30: 650 mg via ORAL
  Filled 2019-11-30 (×2): qty 2

## 2019-11-30 MED ORDER — PREDNISONE 10 MG PO TABS
40.0000 mg | ORAL_TABLET | Freq: Every day | ORAL | 0 refills | Status: AC
Start: 2019-11-30 — End: 2019-12-05

## 2019-11-30 MED ORDER — BENZONATATE 100 MG PO CAPS
200.0000 mg | ORAL_CAPSULE | Freq: Once | ORAL | Status: AC
  Administered 2019-11-30: 200 mg via ORAL
  Filled 2019-11-30: qty 2

## 2019-11-30 NOTE — ED Triage Notes (Signed)
Pt reports having a fever of 103, headaches and episodes of SOB since Saturday.  Did not take any OTC for fever.  Took a home COVID test today, negative

## 2019-11-30 NOTE — ED Provider Notes (Signed)
[facility] EMERGENCY DEPARTMENT Provider Note   CSN: [id number] Arrival date & time: 11/29/19  2353     History Chief Complaint  Patient presents with  . Fever  . Shortness of Breath    Debra [name] is a 44 y.o. female.  Patient is a 44 year old obese female has a past medical history of asthma presenting to the emergency department for fever, cough and shortness of breath for the last 2 days. Denies sick contacts. Reports that she has been using her inhaler daily. Denies any chest pain, nausea, vomiting, diarrhea, dysuria.        Past Medical History:  Diagnosis Date  . Allergic rhinitis, cause unspecified   . Arthritis   . Asthma   . Diabetes mellitus type II 08/2009 dx  . Endometriosis   . Hyperlipemia   . Hypertension   . Knee pain, right    DJD, post traumatic with repeat falls  . Lactose intolerance   . Morbid obesity ([place])    s/p lab band 09/2010  . Osteoarthritis   . Ovarian cyst    right s/p resection June 2013  . Stroke [facility])     Patient Active Problem List   Diagnosis Date Noted  . Lightheadedness 04/14/2019  . Allergic reaction 03/02/2019  . Cerebrovascular accident (CVA) due to occlusion of left middle cerebral artery ([place]) 01/08/2019  . Psychophysiological insomnia 01/08/2019  . Menopausal vasomotor syndrome 01/08/2019  . OSA (obstructive sleep apnea) 01/08/2019  . Hot flashes 10/04/2017  . Stenosis of both middle cerebral arteries 07/07/2017  . Hyperlipidemia associated with type 2 diabetes mellitus ([place])   . Hx of completed stroke 07/01/2017  . Left shoulder pain 05/21/2016  . Routine general medical examination at a health care facility 10/13/2015  . Hypertension 08/26/2011  . Hx of laparoscopic gastric banding, 10/02/2010. 11/27/2010  . Diabetes mellitus type 2, uncontrolled ([place]) 08/23/2009  . Morbid obesity ([place]) 08/22/2009  . Allergic rhinitis 08/22/2009  . Asthma 08/22/2009  . ARTHRITIS 08/22/2009    Past  Surgical History:  Procedure Laterality Date  . ABDOMINAL HYSTERECTOMY  05/14/12   [place]  . IR ANGIO INTRA EXTRACRAN SEL COM CAROTID INNOMINATE BILAT MOD SED  07/04/2017  . IR ANGIO INTRA EXTRACRAN SEL COM CAROTID INNOMINATE UNI R MOD SED  08/26/2019  . IR ANGIO VERTEBRAL SEL VERTEBRAL BILAT MOD SED  07/04/2017  . IR ANGIO VERTEBRAL SEL VERTEBRAL BILAT MOD SED  08/26/2019  . IR INTRA CRAN STENT  07/07/2017  . IR RADIOLOGIST EVAL & MGMT  08/13/2017  . IR US GUIDE VASC ACCESS RIGHT  08/26/2019  . LAPAROSCOPIC GASTRIC BANDING  10/02/2010   start weight 349#  . RADIOLOGY WITH ANESTHESIA N/A 07/07/2017   Procedure: STENTING;  Surgeon: Luanne Bras, MD;  Location: [facility];  Service: Radiology;  Laterality: N/A;  . SALPINGOOPHORECTOMY  10/08/11   right     OB History    Gravida  0   Para  0   Term  0   Preterm  0   AB  0   Living  0     SAB  0   TAB  0   Ectopic  0   Multiple  0   Live Births  0           Family History  Problem Relation Age of Onset  . Heart disease Mother   . Hypertension Mother   . Diabetes Mother   . Stroke Father 69  .  Diabetes Father   . Hypertension Father   . Hyperlipidemia Father   . Heart disease Father   . Kidney disease Father   . Cancer Father   . Liver disease Father   . Obesity Father     Social History   Tobacco Use  . Smoking status: Never Smoker  . Smokeless tobacco: Never Used  Vaping Use  . Vaping Use: Never used  Substance Use Topics  . Alcohol use: No    Alcohol/week: 0.0 standard drinks  . Drug use: No    Home Medications Prior to Admission medications   Medication Sig Start Date End Date Taking? Authorizing Provider  albuterol (PROAIR HFA) 108 (90 BASE) MCG/ACT inhaler Inhale 2 puffs into the lungs every 4 (four) hours as needed for wheezing or shortness of breath. 04/18/15   Rowe Clack, MD  albuterol (PROVENTIL) (2.5 MG/3ML) 0.083% nebulizer solution Take 2.5 mg by nebulization every 6  (six) hours as needed for wheezing or shortness of breath.    [provider]  amLODipine (NORVASC) 2.5 MG tablet Take 3 tablets (7.5 mg total) by mouth daily. 06/08/19   Hoyt Koch, MD  benzonatate (TESSALON) 100 MG capsule Take 1 capsule (100 mg total) by mouth every 8 (eight) hours. 11/30/19   Alveria Apley, PA-C  blood glucose meter kit and supplies Dispense based on patient and insurance preference. Use up to four times daily as directed. (FOR ICD-9 250.00, 250.01). 06/03/14   Rowe Clack, MD  EPINEPHrine 0.3 mg/0.3 mL IJ SOAJ injection Inject 0.3 mLs (0.3 mg total) into the muscle as needed for anaphylaxis. 03/02/19   Hoyt Koch, MD  fexofenadine (ALLEGRA) 180 MG tablet Take 180 mg by mouth daily.     [provider]  fluticasone (FLONASE) 50 MCG/ACT nasal spray Place 2 sprays into both nostrils daily. Patient taking differently: Place 2 sprays into both nostrils daily as needed for allergies.  11/10/18   Hassell Done, Mary-Margaret, FNP  glipiZIDE (GLUCOTROL) 10 MG tablet Take 1 tablet (10 mg total) by mouth 2 (two) times daily before a meal. 06/11/19   Hoyt Koch, MD  glucose blood test strip Used to check blood sugars up to four times daily as needed 07/23/17   Hoyt Koch, MD  ibuprofen (ADVIL) 800 MG tablet Take 1 tablet (800 mg total) by mouth 3 (three) times daily. Patient not taking: Reported on 08/19/2019 04/16/19   Montine Circle, PA-C  Lancets MISC Used to check blood sugars up to four times daily as needed 07/23/17   Hoyt Koch, MD  losartan (COZAAR) 100 MG tablet TAKE 1 TABLET(100 MG) BY MOUTH DAILY Patient taking differently: Take 100 mg by mouth daily.  06/08/19   Hoyt Koch, MD  Menthol, Topical Analgesic, (BIOFREEZE EX) Apply 1 application topically daily as needed (pain).    [provider]  montelukast (SINGULAIR) 10 MG tablet Take 1 tablet (10 mg total) by mouth at bedtime. Patient not  taking: Reported on 08/19/2019 03/01/19   Hoyt Koch, MD  olopatadine (PATANOL) 0.1 % ophthalmic solution Place 1 drop into both eyes 2 (two) times daily as needed for allergies.  08/08/12   [provider]  predniSONE (DELTASONE) 10 MG tablet Take 4 tablets (40 mg total) by mouth daily for 5 days. 11/30/19 12/05/19  Madilyn Hook A, PA-C  rosuvastatin (CRESTOR) 40 MG tablet Take 1 tablet (40 mg total) by mouth daily. 06/08/19   Hoyt Koch, MD  senna (SENOKOT) 8.6 MG TABS tablet Take 1 tablet by mouth daily.    [provider]  sitaGLIPtin (JANUVIA) 100 MG tablet Take 1 tablet (100 mg total) by mouth daily. 06/11/19   Hoyt Koch, MD  ticagrelor (BRILINTA) 90 MG TABS tablet Take 0.5 tablets (45 mg total) by mouth 2 (two) times daily. 08/03/19   Hoyt Koch, MD    Allergies    Nutritional supplements, Dust mite extract, Metformin and related, Morphine, Augmentin [amoxicillin-pot clavulanate], and Influenza vaccines  Review of Systems   Review of Systems  Constitutional: Positive for chills, fatigue and fever. Negative for appetite change.  HENT: Positive for sore throat. Negative for congestion, ear pain, postnasal drip, rhinorrhea and sinus pain.   Eyes: Negative.   Respiratory: Positive for cough. Negative for shortness of breath and wheezing.   Cardiovascular: Negative for chest pain.  Gastrointestinal: Negative for abdominal pain, nausea and vomiting.  Genitourinary: Negative for dysuria.  Musculoskeletal: Negative.   Skin: Negative.   Neurological: Negative for dizziness, light-headedness and headaches.    Physical Exam Updated Vital Signs BP (!) 146/72   Pulse 75   Temp 99.2 F (37.3 C) (Oral)   Resp 19   Ht '5\' 8"'  (1.727 m)   Wt (!) 145.2 kg   LMP 07/28/2011   SpO2 100%   BMI 48.66 kg/m   Physical Exam Vitals and nursing note reviewed.  Constitutional:      General: She is not in acute distress.    Appearance: Normal  appearance. She is well-developed. She is obese. She is not ill-appearing, toxic-appearing or diaphoretic.  HENT:     Head: Normocephalic.  Eyes:     Extraocular Movements: Extraocular movements intact.     Conjunctiva/sclera: Conjunctivae normal.     Pupils: Pupils are equal, round, and reactive to light.  Cardiovascular:     Rate and Rhythm: Normal rate and regular rhythm.  Pulmonary:     Effort: Pulmonary effort is normal. No tachypnea or bradypnea.     Breath sounds: Normal breath sounds. No decreased breath sounds, wheezing, rhonchi or rales.  Abdominal:     Palpations: Abdomen is soft.  Skin:    General: Skin is dry.  Neurological:     Mental Status: She is alert.  Psychiatric:        Mood and Affect: Mood normal.     ED Results / Procedures / Treatments   Labs (all labs ordered are listed, but only abnormal results are displayed) Labs Reviewed  BASIC METABOLIC PANEL - Abnormal; Notable for the following components:      Result Value   Glucose, Bld 357 (*)    All other components within normal limits  CBC - Abnormal; Notable for the following components:   RBC 5.14 (*)    MCH 25.7 (*)    All other components within normal limits  SARS CORONAVIRUS 2 BY RT PCR (HOSPITAL ORDER, [facility])  TROPONIN I (HIGH SENSITIVITY)  TROPONIN I (HIGH SENSITIVITY)    EKG EKG Interpretation  Date/Time:  Tuesday November 30 2019 00:39:22 EDT Ventricular Rate:  80 PR Interval:  128 QRS Duration: 84 QT Interval:  366 QTC Calculation: 422 R Axis:   58 Text Interpretation: Normal sinus rhythm T wave abnormality, consider inferolateral ischemia Abnormal ECG When compared with ECG of 06/30/2017, T wave abnormality Inferior leads is slightly more prominent Premature atrial complexes are no longer present Confirmed by Delora Fuel ([id number]) on 11/30/2019 2:59:14 AM  Also confirmed by Delora Fuel ([id number]), editor Hattie Perch [phone])  on 11/30/2019 10:52:23  AM   Radiology DG Chest 2 View  Result Date: 11/30/2019 CLINICAL DATA:  Shortness of breath and fever. EXAM: CHEST - 2 VIEW COMPARISON:  None. FINDINGS: Heart size is within normal limits, exaggerated by low lung volumes. No edema or effusion is present. No focal airspace disease is present. IMPRESSION: 1. Low lung volumes. 2. No acute cardiopulmonary disease. Electronically Signed   By: San Morelle M.D.   On: 11/30/2019 10:14    Procedures Procedures (including critical care time)  Medications Ordered in ED Medications  acetaminophen (TYLENOL) tablet 650 mg (650 mg Oral Given 11/30/19 0800)  acetaminophen (TYLENOL) tablet 650 mg (650 mg Oral Given 11/30/19 1812)  benzonatate (TESSALON) capsule 200 mg (200 mg Oral Given 11/30/19 1849)    ED Course  I have reviewed the triage vital signs and the nursing notes.  Pertinent labs & imaging results that were available during my care of the patient were reviewed by me and considered in my medical decision making (see chart for details).  Clinical Course as of Nov 29 1936  Tue Nov 30, 2019  1854 Patient presenting to the ER for fever and shortness of breath. Patient is well-appearing on exam. She did have a fever on arrival of 101. She is nontoxic-appearing, no respiratory distress. Her physical exam was unremarkable including clear breath sounds. Labs are also unremarkable including a negative Covid test, negative troponin x2, metabolic panel and CBC are unremarkable. Chest x-ray is clear. Given patient's symptoms she likely has a viral upper respiratory infection this was explained to the patient. She feels like this is consistent with her past symptoms of upper respiratory infections. She reports that usually the symptoms tend to get worse before they get better. Although she is not wheezing, she opted for a prescription of prednisone to take if she begins to wheeze worse. This is reasonable. We will also start her on [place]. She was advised to take her albuterol treatment every 4 hours as needed. Advised on return precautions. Advised on self isolation and the incident that her Covid test is a false negative. She has no dysuria/belly pain to suggest UTI or intraabdominal process. No photophobia/headache to suggest meningitis.    [KM]    Clinical Course User Index [KM] Kristine Royal   MDM Rules/Calculators/A&P                          Based on review of vitals, medical screening exam, lab work and/or imaging, there does not appear to be an acute, emergent etiology for the patient's symptoms. Counseled pt on good return precautions and encouraged both PCP and ED follow-up as needed.  Prior to discharge, I also discussed incidental imaging findings with patient in detail and advised appropriate, recommended follow-up in detail.  Clinical Impression: 1. Viral upper respiratory tract infection     Disposition: Discharge  Prior to providing a prescription for a controlled substance, I independently reviewed the patient's recent prescription history on the [place]. The patient had no recent or regular prescriptions and was deemed appropriate for a brief, less than 3 day prescription of narcotic for acute analgesia.  This note was prepared with assistance of Systems analyst. Occasional wrong-word or sound-a-like substitutions may have occurred due to the inherent limitations of voice recognition software.  Final Clinical Impression(s) / ED  Diagnoses Final diagnoses:  Viral upper respiratory tract infection    Rx / DC Orders ED Discharge Orders         Ordered    predniSONE (DELTASONE) 10 MG tablet  Daily     Discontinue  Reprint     11/30/19 1857    benzonatate (TESSALON) 100 MG capsule  Every 8 hours     Discontinue  Reprint     11/30/19 1857           Kristine Royal 11/30/19 Lattie Corns    Lajean Saver, MD 11/30/19  [phone]

## 2019-11-30 NOTE — ED Notes (Signed)
Patient verbalizes understanding of discharge instructions. Opportunity for questioning and answers were provided. Armband removed by staff, pt discharged from ED ambulatory.   

## 2019-11-30 NOTE — ED Notes (Signed)
Pt pulled back and given Tylenol for fever. Pt alert and oriented. Talking on phone. Updated on plan of care

## 2019-11-30 NOTE — Discharge Instructions (Addendum)
You are seen today for fever and cough. Your work-up was reassuring including a negative Covid test. However, you are probably still contagious with another viral process. It is advised that you stay out of work for at least 10 days from the onset of your symptoms and until you are fever free for at least 72 hours. If you have any new or worsening symptoms you should return to the emergency department. You should start the prednisone only if you begin to wheeze more. Make sure you use albuterol treatments every 8 hours as needed.

## 2019-12-03 ENCOUNTER — Encounter: Payer: Self-pay | Admitting: Internal Medicine

## 2019-12-03 ENCOUNTER — Telehealth (INDEPENDENT_AMBULATORY_CARE_PROVIDER_SITE_OTHER): Payer: BC Managed Care – PPO | Admitting: Internal Medicine

## 2019-12-03 DIAGNOSIS — R5383 Other fatigue: Secondary | ICD-10-CM

## 2019-12-03 DIAGNOSIS — J4520 Mild intermittent asthma, uncomplicated: Secondary | ICD-10-CM

## 2019-12-03 DIAGNOSIS — I10 Essential (primary) hypertension: Secondary | ICD-10-CM

## 2019-12-03 MED ORDER — IBUPROFEN 800 MG PO TABS: 800.0000 mg | ORAL_TABLET | Freq: Three times a day (TID) | ORAL | 0 refills | Status: DC

## 2019-12-03 MED ORDER — LOSARTAN POTASSIUM-HCTZ 100-12.5 MG PO TABS: 1.0000 | ORAL_TABLET | Freq: Every day | ORAL | 3 refills | Status: DC

## 2019-12-03 NOTE — Assessment & Plan Note (Signed)
No flare today and she did not end up taking prednisone from ER. Advised this is likely viral RSV or flu and she should be improving. As long as fever free 24 hours okay to be around others without risk of spreading illness.

## 2019-12-03 NOTE — Assessment & Plan Note (Signed)
Stop amlodipine and losartan. Start Marine scientist.

## 2019-12-03 NOTE — Progress Notes (Signed)
Virtual Visit via Video Note  I connected with Debra [name] on 12/03/19 at  1:20 PM EDT by a video enabled telemedicine application and verified that I am speaking with the correct person using two identifiers.  The patient and the provider were at separate locations throughout the entire encounter. Patient location: home, Provider location: work   I discussed the limitations of evaluation and management by telemedicine and the availability of in person appointments. The patient expressed understanding and agreed to proceed. The patient and the provider were the only parties present for the visit unless noted in HPI below.  History of Present Illness: The patient is a 44 y.o. female with visit for fever, chills. Started about 4-5 days ago and no fevers today. She did run as high as 103F which concerned her. She went to ER and got covid-19 testing which was negative. She is fully vaccinated. She also had troponin, EKG and CXR which were normal. Has some cough and congestion but overall improving. Denies worsening or SOB. Has tried otc medications including tylenol which have helped. Having some swelling from amlodipine and wants to know if we can switch her BP regimen. Overall BP is controlled and she does not miss doses.   Observations/Objective: Appearance: mildly sick, breathing appears normal, mild coughing during visit, casual grooming, abdomen does not appear distended, throat not visualized well, mental status is A and O times 3  Assessment and Plan: See problem oriented charting  Follow Up Instructions: rx losartan/hctz and stop amlodipine and losartan   I discussed the assessment and treatment plan with the patient. The patient was provided an opportunity to ask questions and all were answered. The patient agreed with the plan and demonstrated an understanding of the instructions.   The patient was advised to call back or seek an in-person evaluation if the symptoms worsen or if the  condition fails to improve as anticipated.  Myrlene Broker, MD

## 2019-12-13 ENCOUNTER — Ambulatory Visit: Payer: BC Managed Care – PPO | Admitting: Family Medicine

## 2019-12-13 ENCOUNTER — Telehealth: Payer: Self-pay | Admitting: Internal Medicine

## 2019-12-13 ENCOUNTER — Encounter: Payer: Self-pay | Admitting: Family Medicine

## 2019-12-13 VITALS — BP 117/68 | HR 68 | Ht 68.0 in | Wt 328.0 lb

## 2019-12-13 DIAGNOSIS — G4733 Obstructive sleep apnea (adult) (pediatric): Secondary | ICD-10-CM

## 2019-12-13 DIAGNOSIS — Z9989 Dependence on other enabling machines and devices: Secondary | ICD-10-CM | POA: Diagnosis not present

## 2019-12-13 NOTE — Progress Notes (Signed)
PATIENT: Debra [name] DOB: [birth year]  REASON FOR VISIT: follow up HISTORY FROM: patient  Chief Complaint  Patient presents with  . Follow-up    new rm, pt states she is doing well with cpap      HISTORY OF PRESENT ILLNESS: Today 12/13/19 Debra [name] is a 44 y.o. female here today for follow up for OSA on CPAP.  Debra [name] is doing very well on CPAP therapy.  She continues to use her CPAP nightly.  She reports that she has had a change in insurance and has not been able to get new supplies.  We will assist with a prescription for new supplies today.  She is feeling well and without any concerns.  Compliance report dated 11/09/2019 through 12/08/2019 reveals that she used CPAP 29 of the past 30 days for compliance of 97%.  She used CPAP greater than 4 hours 29 of the past 30 days for compliance of 97%.  Average usage was 8 hours and 43 minutes.  Residual AHI was 0.3 on 6 to 12 cm of water and an EPR of 3.  There was no significant leak noted.  HISTORY: (copied from my note on 06/15/2019)  Debra [name] is a 44 y.o. female here today for follow up for OSA on CPAP. Sleep study revealed mild OSA with total AHI of 5.5/hr but strong REM dependant OSA with AHI of 24/hr. She was started on auto PAP therapy at 6-12cmH20 pressure and medium Dreamwear FFM. She is doing very well with CPAP therapy.  She has adjusted well and noted significant improvement in sleep quality and increased energy.  She does report that he had humidity is, at times, too hot and causes her to have hot flash.  She has not tried adjusting the settings on her CPAP machine at home.  Compliance report dated 05/15/2019 through 06/13/2019 reveals that she used CPAP 30 out of the last 30 days for compliance of 100%.  26 of the last 30 days she used CPAP greater than 4 hours for compliance of 87%.  Average usage was 6 hours and 50 minutes.  Residual AHI was 0.2 on 6 to 12 cm of water and an EPR of 3.  There was no significant leak  noted.    HISTORY: (copied from Dr Dohmeier's note on 12/10/2018)  Debra [name] a43 y.o.year old Black or African Americanfemalepatientseen hereuponreferralby Janett Billow Vanschaickon8/20/2020  Chiefconcernaccording to patient :" My sleeping patten has changed with my recent stroke- and started in March 19th 2019 "" My doctors d/c my hormone replacement therapy after my strokewhich has helped me to sleep'-    I have the pleasure of seeingTiffany L Davistoday,aright-handedAfrican Americanfemalewith a possible sleep disorder. She hashad a stroke in the left MCA - Dr. Andee Poles a past medical history of Allergic rhinitis, cause unspecified, Arthritis, Asthma, Diabetes mellitus type II (08/2009 dx), Endometriosis, Hyperlipemia, Hypertension, Knee pain, right, Lactose intolerance, Morbid obesity ([place]), Osteoarthritis, Ovarian cyst, and Stroke ([place]).she is in surgical menopause , status post total hysterectomy ( 2015). The patientneverhad asleep study.   History Summary DebraJaina L [name] a 44 y.o.femalewith history of HTN, type 2 DM, morbid obesity, endometriosis/ovarian cyst s/p hysterectomy/SBO and seasonal allergies admitted on 06/30/17 for slurred speech and expressive aphasia. CT head showed small cortical hypoattenuation within the left parietal occipital junction possible acute infarct.CTA head and neck on admission multiple anterior and posterior stenosis, and MRI showed a cluster of acute infarct posterior left temporal lobe and temporoparietal  junction region in the left MCA territory. After admission, patient had fluctuating left MCA syndrome especially with positional changes and BP changes. Repeat CT showed worsening stable left medial occipital lobe infarct. Repeat CT head and neck showed unchanged bilateral high-grade M2 stenosis. CT perfusion with small perfusion deficit, not amendable to intervention. Repeat MRI showed patchy acute infarct  in the left posterior MCA and watershed distributions. Underwent cerebral angiogram on 07/04/2017, found to have left MCA inferior division >75% stenosis and right M1 80% stenosis, no vasculitis but atherosclerosis. At end of procedure patient had a vasovagal event while holding pressure at the right groin. BP dropped and she again had left MCA syndrome. She was given IV bolus, put into Trendelenburg position and treated with pressors. BP improved and symptoms resolved. She was admitted in ICU after procedure, continues to have mild expressive aphasia but motor function at baseline. LDL 129, and A1c 8.5. UDS negative. Hypercoagulable work-up negative. She received left MCA stenting on 07/07/2017. Her symptoms improved and was discharged home on 07/08/2017 with home health PT/speech. Home medication including aspirin, Brilinta and Lipitor 80.    Sleeprelevant medical history: Tonsillectomy; none, cervical spine surgeryno ENT surgery or trauma. She is morbidly obese.  Familymedical /sleep history:Noother family member on CPAP with OSA.father had a CVA in his 9s and remained aphasic. HTN, DM .  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 and lives in a household with2persons- she and her mother. Family status issingle. Pets arenotpresent. Tobacco use: none. ETOH use; none . Caffeine intake in form of Coffee (none) Soda(2 cans a day) Tea ( rarely), noenergy drinks. Regular exercise in form ofwalking Hobbies :Shooting.   Sleep habits are as follows: The patient's dinner time is between6-7PM. The patient goes to bed at 11-12PM and continues to have trouble to initiatesleep forseveral hours,most nights. If asleep, stays asleep for hours. She is easily woken by sounds She maywake foronebathroom break- but cannot go back to sleep after. Her bedroom is not cool, quiet and  dark- her mother needs the home warmer. She sweats a lot. The preferred sleep position islateral, with the support of2pillows. Dreams are reportedly frequentandvivid. Not nightmarish.  10.30 AM is the usual rise time. The patient wakes up spontaneously.  She reports not feeling refreshed or restored in AM,rarely havingsymptoms such as dry mouth ormorning headachesbut residual fatigue.  Naps arenottaken.    REVIEW OF SYSTEMS: Out of a complete 14 system review of symptoms, the patient complains only of the following symptoms, none and all other reviewed systems are negative.  ESS: 5 FSS: 30  ALLERGIES: Allergies  Allergen Reactions  . Nutritional Supplements Anaphylaxis and Rash    NUT allergy  . Dust Mite Extract   . Metformin And Related Diarrhea  . Morphine Hives  . Augmentin [Amoxicillin-Pot Clavulanate] Rash    To bilat post hands only  . Influenza Vaccines Itching and Rash    HOME MEDICATIONS: Outpatient Medications Prior to Visit  Medication Sig Dispense Refill  . albuterol (PROAIR HFA) 108 (90 BASE) MCG/ACT inhaler Inhale 2 puffs into the lungs every 4 (four) hours as needed for wheezing or shortness of breath. 18 g 2  . albuterol (PROVENTIL) (2.5 MG/3ML) 0.083% nebulizer solution Take 2.5 mg by nebulization every 6 (six) hours as needed for wheezing or shortness of breath.    . benzonatate (TESSALON) 100 MG capsule Take 1 capsule (100 mg total) by mouth every  8 (eight) hours. 21 capsule 0  . blood glucose meter kit and supplies Dispense based on patient and insurance preference. Use up to four times daily as directed. (FOR ICD-9 250.00, 250.01). 1 each 0  . EPINEPHrine 0.3 mg/0.3 mL IJ SOAJ injection Inject 0.3 mLs (0.3 mg total) into the muscle as needed for anaphylaxis. 1 each 1  . fexofenadine (ALLEGRA) 180 MG tablet Take 180 mg by mouth daily.     . fluticasone (FLONASE) 50 MCG/ACT nasal spray Place 2 sprays into both nostrils daily. (Patient taking  differently: Place 2 sprays into both nostrils daily as needed for allergies. ) 16 g 6  . glipiZIDE (GLUCOTROL) 10 MG tablet Take 1 tablet (10 mg total) by mouth 2 (two) times daily before a meal. 180 tablet 3  . glucose blood test strip Used to check blood sugars up to four times daily as needed 200 each 3  . ibuprofen (ADVIL) 800 MG tablet Take 1 tablet (800 mg total) by mouth 3 (three) times daily. 90 tablet 0  . Lancets MISC Used to check blood sugars up to four times daily as needed 200 each 3  . losartan-hydrochlorothiazide (HYZAAR) 100-12.5 MG tablet Take 1 tablet by mouth daily. 90 tablet 3  . Menthol, Topical Analgesic, (BIOFREEZE EX) Apply 1 application topically daily as needed (pain).    . montelukast (SINGULAIR) 10 MG tablet Take 1 tablet (10 mg total) by mouth at bedtime. 30 tablet 3  . olopatadine (PATANOL) 0.1 % ophthalmic solution Place 1 drop into both eyes 2 (two) times daily as needed for allergies.     . rosuvastatin (CRESTOR) 40 MG tablet Take 1 tablet (40 mg total) by mouth daily. 90 tablet 3  . senna (SENOKOT) 8.6 MG TABS tablet Take 1 tablet by mouth daily.    . sitaGLIPtin (JANUVIA) 100 MG tablet Take 1 tablet (100 mg total) by mouth daily. 90 tablet 3  . ticagrelor (BRILINTA) 90 MG TABS tablet Take 0.5 tablets (45 mg total) by mouth 2 (two) times daily. 30 tablet 11   No facility-administered medications prior to visit.    PAST MEDICAL HISTORY: Past Medical History:  Diagnosis Date  . Allergic rhinitis, cause unspecified   . Arthritis   . Asthma   . Diabetes mellitus type II 08/2009 dx  . Endometriosis   . Hyperlipemia   . Hypertension   . Knee pain, right    DJD, post traumatic with repeat falls  . Lactose intolerance   . Morbid obesity ([place])    s/p lab band 09/2010  . Osteoarthritis   . Ovarian cyst    right s/p resection June 2013  . Stroke [place])     PAST SURGICAL HISTORY: Past Surgical History:  Procedure Laterality Date  . ABDOMINAL HYSTERECTOMY   05/14/12   [facility]  . IR ANGIO INTRA EXTRACRAN SEL COM CAROTID INNOMINATE BILAT MOD SED  07/04/2017  . IR ANGIO INTRA EXTRACRAN SEL COM CAROTID INNOMINATE UNI R MOD SED  08/26/2019  . IR ANGIO VERTEBRAL SEL VERTEBRAL BILAT MOD SED  07/04/2017  . IR ANGIO VERTEBRAL SEL VERTEBRAL BILAT MOD SED  08/26/2019  . IR INTRA CRAN STENT  07/07/2017  . IR RADIOLOGIST EVAL & MGMT  08/13/2017  . IR US GUIDE VASC ACCESS RIGHT  08/26/2019  . LAPAROSCOPIC GASTRIC BANDING  10/02/2010   start weight 349#  . RADIOLOGY WITH ANESTHESIA N/A 07/07/2017   Procedure: STENTING;  Surgeon: Luanne Bras, MD;  Location: [facility];  Service: Radiology;  Laterality: N/A;  . SALPINGOOPHORECTOMY  10/08/11   right    FAMILY HISTORY: Family History  Problem Relation Age of Onset  . Heart disease Mother   . Hypertension Mother   . Diabetes Mother   . Stroke Father 37  . Diabetes Father   . Hypertension Father   . Hyperlipidemia Father   . Heart disease Father   . Kidney disease Father   . Cancer Father   . Liver disease Father   . Obesity Father     SOCIAL HISTORY: Social History   Socioeconomic History  . Marital status: Single    Spouse name: n/a  . Number of children: 0  . Years of education: 12+  . Highest education level: Not on file  Occupational History  . Occupation: Retired  Tobacco Use  . Smoking status: Never Smoker  . Smokeless tobacco: Never Used  Vaping Use  . Vaping Use: Never used  Substance and Sexual Activity  . Alcohol use: No    Alcohol/week: 0.0 standard drinks  . Drug use: No  . Sexual activity: Not on file  Other Topics Concern  . Not on file  Social History Narrative   Single, lives with mom.    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   Completed some college.   Social Determinants of Health   Financial Resource Strain:   . Difficulty of Paying Living Expenses: Not on file  Food  Insecurity:   . Worried About Charity fundraiser in the Last Year: Not on file  . Ran Out of Food in the Last Year: Not on file  Transportation Needs:   . Lack of Transportation (Medical): Not on file  . Lack of Transportation (Non-Medical): Not on file  Physical Activity:   . Days of Exercise per Week: Not on file  . Minutes of Exercise per Session: Not on file  Stress:   . Feeling of Stress : Not on file  Social Connections:   . Frequency of Communication with Friends and Family: Not on file  . Frequency of Social Gatherings with Friends and Family: Not on file  . Attends Religious Services: Not on file  . Active Member of Clubs or Organizations: Not on file  . Attends Archivist Meetings: Not on file  . Marital Status: Not on file  Intimate Partner Violence:   . Fear of Current or Ex-Partner: Not on file  . Emotionally Abused: Not on file  . Physically Abused: Not on file  . Sexually Abused: Not on file      PHYSICAL EXAM  Vitals:   12/13/19 1413  BP: 117/68  Pulse: 68  Weight: (!) 328 lb (148.8 kg)  Height: _0  (1.727 m)   Body mass index is 49.87 kg/m.  Generalized: Well developed, in no acute distress  Cardiology: normal rate and rhythm, no murmur noted Respiratory: clear to auscultation bilaterally  Neurological examination  Mentation: Alert oriented to time, place, history taking. Follows all commands speech and language fluent Cranial nerve II-XII: Pupils were equal round reactive to light. Extraocular movements were full, visual field were full  Motor: The motor testing reveals 5 over 5 strength of all 4 extremities. Good symmetric motor tone is noted throughout.  Gait and station: Gait is normal.   DIAGNOSTIC DATA (LABS, IMAGING, TESTING) - I reviewed patient records, labs, notes, testing and imaging myself where available.  No flowsheet data found.   Lab Results  Component Value Date   WBC 7.5 11/30/2019   HGB 13.2 11/30/2019   HCT  43.0 11/30/2019   MCV 83.7 11/30/2019   PLT 257 11/30/2019      Component Value Date/Time   NA 135 11/30/2019 0126   NA 142 07/01/2018 1412   K 4.2 11/30/2019 0126   CL 98 11/30/2019 0126   CO2 25 11/30/2019 0126   GLUCOSE 357 (H) 11/30/2019 0126   BUN 7 11/30/2019 0126   BUN 10 07/01/2018 1412   CREATININE 0.83 11/30/2019 0126   CALCIUM 9.2 11/30/2019 0126   PROT 6.0 04/14/2019 1140   PROT 6.7 07/01/2018 1412   ALBUMIN 3.7 04/14/2019 1140   ALBUMIN 4.2 07/01/2018 1412   AST 13 04/14/2019 1140   ALT 18 04/14/2019 1140   ALKPHOS 99 04/14/2019 1140   BILITOT 0.3 04/14/2019 1140   BILITOT 0.3 07/01/2018 1412   GFRNONAA >60 11/30/2019 0126   GFRAA >60 11/30/2019 0126   Lab Results  Component Value Date   CHOL 90 10/09/2018   HDL 42.10 10/09/2018   LDLCALC 37 10/09/2018   TRIG 55.0 10/09/2018   CHOLHDL 2 10/09/2018   Lab Results  Component Value Date   HGBA1C 8.5 (A) 02/08/2019   Lab Results  Component Value Date   VITAMINB12 512 07/01/2018   Lab Results  Component Value Date   TSH 1.620 07/01/2018       ASSESSMENT AND PLAN 44 y.o. year old female  has a past medical history of Allergic rhinitis, cause unspecified, Arthritis, Asthma, Diabetes mellitus type II (08/2009 dx), Endometriosis, Hyperlipemia, Hypertension, Knee pain, right, Lactose intolerance, Morbid obesity ([place]), Osteoarthritis, Ovarian cyst, and Stroke ([place]). here with     ICD-10-CM   1. OSA on CPAP  G47.33 For home use only DME continuous positive airway pressure (CPAP)   Z99.89     Danelle is doing very well with CPAP therapy.  Compliance report reveals excellent compliance.  She was encouraged to continue using CPAP nightly and for greater than 4 hours each night.  Healthy lifestyle habits encouraged.  She will continue to follow-up with primary care as directed.  She will follow up with [name] in 1 year, sooner if needed.  She verbalizes understanding and agreement with this plan.   Orders Placed  This Encounter  Procedures  . For home use only DME continuous positive airway pressure (CPAP)    CPAP supplies    Order Specific Question:   Length of Need    Answer:   Lifetime    Order Specific Question:   Patient has OSA or probable OSA    Answer:   Yes    Order Specific Question:   Is the patient currently using CPAP in the home    Answer:   Yes    Order Specific Question:   Settings    Answer:   Other see comments    Order Specific Question:   CPAP supplies needed    Answer:   Mask, headgear, cushions, filters, heated tubing and water chamber     No orders of the defined types were placed in this encounter.     I spent 15 minutes with the patient. 50% of this time was spent counseling and educating patient on plan of care and medications.    Debbora Presto, FNP-C 12/13/2019, 3:04 PM [facility] [address] [place], [place] [address] [phone]

## 2019-12-13 NOTE — Telephone Encounter (Signed)
    Patient calling to report blood sugar was 425 on yesterday, 314 today  Call transferred to [facility]

## 2019-12-13 NOTE — Telephone Encounter (Signed)
[facility] message: -- Caller states, a week ago was seen at hospital. Stopped the prednisone, felt sugars are too high, stopped medication. Has been eating, but sugars ar not where they should be. Blood sugars was 315 this morning. Please advise.

## 2019-12-13 NOTE — Patient Instructions (Signed)
°Please continue using your CPAP regularly. While your insurance requires that you use CPAP at least 4 hours each night on 70% of the nights, I recommend, that you not skip any nights and use it throughout the night if you can. Getting used to CPAP and staying with the treatment long term does take time and patience and discipline. Untreated obstructive sleep apnea when it is moderate to severe can have an adverse impact on cardiovascular health and raise her risk for heart disease, arrhythmias, hypertension, congestive heart failure, stroke and diabetes. Untreated obstructive sleep apnea causes sleep disruption, nonrestorative sleep, and sleep deprivation. This can have an impact on your day to day functioning and cause daytime sleepiness and impairment of cognitive function, memory loss, mood disturbance, and problems focussing. Using CPAP regularly can improve these symptoms. ° ° °Follow up in 1 year  ° °CPAP and BPAP Information °CPAP and BPAP are methods of helping a person breathe with the use of air pressure. CPAP stands for "continuous positive airway pressure." BPAP stands for "bi-level positive airway pressure." In both methods, air is blown through your nose or mouth and into your air passages to help you breathe well. °CPAP and BPAP use different amounts of pressure to blow air. With CPAP, the amount of pressure stays the same while you breathe in and out. With BPAP, the amount of pressure is increased when you breathe in (inhale) so that you can take larger breaths. Your health care provider will recommend whether CPAP or BPAP would be more helpful for you. °Why are CPAP and BPAP treatments used? °CPAP or BPAP can be helpful if you have: °· Sleep apnea. °· Chronic obstructive pulmonary disease (COPD). °· Heart failure. °· Medical conditions that weaken the muscles of the chest including muscular dystrophy, or neurological diseases such as amyotrophic lateral sclerosis (ALS). °· Other problems that  cause breathing to be weak, abnormal, or difficult. °CPAP is most commonly used for obstructive sleep apnea (OSA) to keep the airways from collapsing when the muscles relax during sleep. °How is CPAP or BPAP administered? °Both CPAP and BPAP are provided by a small machine with a flexible plastic tube that attaches to a plastic mask. You wear the mask. Air is blown through the mask into your nose or mouth. The amount of pressure that is used to blow the air can be adjusted on the machine. Your health care provider will determine the pressure setting that should be used based on your individual needs. °When should CPAP or BPAP be used? °In most cases, the mask only needs to be worn during sleep. Generally, the mask needs to be worn throughout the night and during any daytime naps. People with certain medical conditions may also need to wear the mask at other times when they are awake. Follow instructions from your health care provider about when to use the machine. °What are some tips for using the mask? ° °· Because the mask needs to be snug, some people feel trapped or closed-in (claustrophobic) when first using the mask. If you feel this way, you may need to get used to the mask. One way to do this is by holding the mask loosely over your nose or mouth and then gradually applying the mask more snugly. You can also gradually increase the amount of time that you use the mask. °· Masks are available in various types and sizes. Some fit over your mouth and nose while others fit over just your nose. If your mask   does not fit well, talk with your health care provider about getting a different one. °· If you are using a mask that fits over your nose and you tend to breathe through your mouth, a chin strap may be applied to help keep your mouth closed. °· The CPAP and BPAP machines have alarms that may sound if the mask comes off or develops a leak. °· If you have trouble with the mask, it is very important that you talk  with your health care provider about finding a way to make the mask easier to tolerate. Do not stop using the mask. Stopping the use of the mask could have a negative impact on your health. °What are some tips for using the machine? °· Place your CPAP or BPAP machine on a secure table or stand near an electrical outlet. °· Know where the on/off switch is located on the machine. °· Follow instructions from your health care provider about how to set the pressure on your machine and when you should use it. °· Do not eat or drink while the CPAP or BPAP machine is on. Food or fluids could get pushed into your lungs by the pressure of the CPAP or BPAP. °· Do not smoke. Tobacco smoke residue can damage the machine. °· For home use, CPAP and BPAP machines can be rented or purchased through home health care companies. Many different brands of machines are available. Renting a machine before purchasing may help you find out which particular machine works well for you. °· Keep the CPAP or BPAP machine and attachments clean. Ask your health care provider for specific instructions. °Get help right away if: °· You have redness or open areas around your nose or mouth where the mask fits. °· You have trouble using the CPAP or BPAP machine. °· You cannot tolerate wearing the CPAP or BPAP mask. °· You have pain, discomfort, and bloating in your abdomen. °Summary °· CPAP and BPAP are methods of helping a person breathe with the use of air pressure. °· Both CPAP and BPAP are provided by a small machine with a flexible plastic tube that attaches to a plastic mask. °· If you have trouble with the mask, it is very important that you talk with your health care provider about finding a way to make the mask easier to tolerate. °This information is not intended to replace advice given to you by your health care provider. Make sure you discuss any questions you have with your health care provider. °Document Revised: 07/29/2018 Document  Reviewed: 02/26/2016 °Elsevier Patient Education © 2020 Else[employer] Inc. ° °

## 2019-12-14 NOTE — Telephone Encounter (Signed)
Noted  

## 2019-12-14 NOTE — Telephone Encounter (Signed)
Keep appointment with Dr. Yetta Barre for tomorrow;

## 2019-12-15 ENCOUNTER — Ambulatory Visit (INDEPENDENT_AMBULATORY_CARE_PROVIDER_SITE_OTHER): Payer: BC Managed Care – PPO | Admitting: Internal Medicine

## 2019-12-15 ENCOUNTER — Other Ambulatory Visit: Payer: Self-pay

## 2019-12-15 ENCOUNTER — Encounter: Payer: Self-pay | Admitting: Internal Medicine

## 2019-12-15 VITALS — BP 128/82 | HR 60 | Temp 98.5°F | Resp 16 | Ht 68.0 in | Wt 326.0 lb

## 2019-12-15 DIAGNOSIS — E785 Hyperlipidemia, unspecified: Secondary | ICD-10-CM

## 2019-12-15 DIAGNOSIS — R5383 Other fatigue: Secondary | ICD-10-CM | POA: Diagnosis not present

## 2019-12-15 DIAGNOSIS — E1165 Type 2 diabetes mellitus with hyperglycemia: Secondary | ICD-10-CM | POA: Diagnosis not present

## 2019-12-15 DIAGNOSIS — I10 Essential (primary) hypertension: Secondary | ICD-10-CM | POA: Diagnosis not present

## 2019-12-15 LAB — POCT GLYCOSYLATED HEMOGLOBIN (HGB A1C): Hemoglobin A1C: 13.4 % — AB (ref 4.0–5.6)

## 2019-12-15 LAB — POCT GLUCOSE (DEVICE FOR HOME USE): Glucose Fasting, POC: 350 mg/dL — AB (ref 70–99)

## 2019-12-15 MED ORDER — NOVOFINE 32G X 6 MM MISC: 1.0000 | Freq: Every day | 3 refills | Status: DC

## 2019-12-15 MED ORDER — FREESTYLE LIBRE 14 DAY SENSOR MISC: 1.0000 | Freq: Every day | 5 refills | Status: DC

## 2019-12-15 MED ORDER — INSULIN ASPART FLEXPEN 100 UNIT/ML ~~LOC~~ SOPN: 5.0000 [IU] | PEN_INJECTOR | Freq: Three times a day (TID) | SUBCUTANEOUS | 1 refills | Status: DC

## 2019-12-15 MED ORDER — FREESTYLE LIBRE 14 DAY READER DEVI: 1.0000 | Freq: Every day | 5 refills | Status: DC

## 2019-12-15 MED ORDER — TRESIBA FLEXTOUCH 200 UNIT/ML ~~LOC~~ SOPN: 50.0000 [IU] | PEN_INJECTOR | Freq: Every day | SUBCUTANEOUS | 0 refills | Status: DC

## 2019-12-15 NOTE — Progress Notes (Signed)
Subjective:  Patient ID: Debra [name], female    DOB: 1976-04-22  Age: 44 y.o. MRN: [id number]  CC: Diabetes and Hyperlipidemia  This visit occurred during the SARS-CoV-2 public health emergency.  Safety protocols were in place, including screening questions prior to the visit, additional usage of staff PPE, and extensive cleaning of exam room while observing appropriate contact time as indicated for disinfecting solutions.    HPI Debra [name] presents for f/up - She complains that her blood sugars are not well controlled.  She recently took a course of prednisone for respiratory syncytial viral infection.  She has had blurred vision, fatigue, and mild polys.  She tells me she is taking a DPP 4 inhibitor and a sulfonylurea.  She used insulin previously but has not been using it recently.  She is active and denies any recent episodes of chest pain, shortness of breath, palpitations, dizziness, or lightheadedness.  Outpatient Medications Prior to Visit  Medication Sig Dispense Refill  . albuterol (PROAIR HFA) 108 (90 BASE) MCG/ACT inhaler Inhale 2 puffs into the lungs every 4 (four) hours as needed for wheezing or shortness of breath. 18 g 2  . albuterol (PROVENTIL) (2.5 MG/3ML) 0.083% nebulizer solution Take 2.5 mg by nebulization every 6 (six) hours as needed for wheezing or shortness of breath.    . blood glucose meter kit and supplies Dispense based on patient and insurance preference. Use up to four times daily as directed. (FOR ICD-9 250.00, 250.01). 1 each 0  . fexofenadine (ALLEGRA) 180 MG tablet Take 180 mg by mouth daily.     . fluticasone (FLONASE) 50 MCG/ACT nasal spray Place 2 sprays into both nostrils daily. (Patient taking differently: Place 2 sprays into both nostrils daily as needed for allergies. ) 16 g 6  . glipiZIDE (GLUCOTROL) 10 MG tablet Take 1 tablet (10 mg total) by mouth 2 (two) times daily before a meal. 180 tablet 3  . glucose blood test strip Used to check blood  sugars up to four times daily as needed 200 each 3  . ibuprofen (ADVIL) 800 MG tablet Take 1 tablet (800 mg total) by mouth 3 (three) times daily. 90 tablet 0  . Lancets MISC Used to check blood sugars up to four times daily as needed 200 each 3  . losartan-hydrochlorothiazide (HYZAAR) 100-12.5 MG tablet Take 1 tablet by mouth daily. 90 tablet 3  . Menthol, Topical Analgesic, (BIOFREEZE EX) Apply 1 application topically daily as needed (pain).    . montelukast (SINGULAIR) 10 MG tablet Take 1 tablet (10 mg total) by mouth at bedtime. 30 tablet 3  . olopatadine (PATANOL) 0.1 % ophthalmic solution Place 1 drop into both eyes 2 (two) times daily as needed for allergies.     . rosuvastatin (CRESTOR) 40 MG tablet Take 1 tablet (40 mg total) by mouth daily. 90 tablet 3  . senna (SENOKOT) 8.6 MG TABS tablet Take 1 tablet by mouth daily.    . ticagrelor (BRILINTA) 90 MG TABS tablet Take 0.5 tablets (45 mg total) by mouth 2 (two) times daily. 30 tablet 11  . sitaGLIPtin (JANUVIA) 100 MG tablet Take 1 tablet (100 mg total) by mouth daily. 90 tablet 3  . EPINEPHrine 0.3 mg/0.3 mL IJ SOAJ injection Inject 0.3 mLs (0.3 mg total) into the muscle as needed for anaphylaxis. (Patient not taking: Reported on 12/15/2019) 1 each 1  . benzonatate (TESSALON) 100 MG capsule Take 1 capsule (100 mg total) by mouth every 8 (eight) hours.  21 capsule 0   No facility-administered medications prior to visit.    ROS Review of Systems  Constitutional: Positive for fatigue and unexpected weight change (wt gain). Negative for appetite change, chills and diaphoresis.  HENT: Negative.   Eyes: Negative.   Respiratory: Negative for cough, chest tightness, shortness of breath and wheezing.   Cardiovascular: Negative for chest pain, palpitations and leg swelling.  Gastrointestinal: Negative for abdominal pain, constipation, diarrhea, nausea and vomiting.  Endocrine: Positive for polydipsia, polyphagia and polyuria.  Genitourinary:  Negative.  Negative for difficulty urinating and dysuria.  Musculoskeletal: Negative for arthralgias, joint swelling and myalgias.  Skin: Negative.  Negative for color change, pallor and rash.  Neurological: Negative.  Negative for dizziness and weakness.  Hematological: Negative for adenopathy. Does not bruise/bleed easily.  Psychiatric/Behavioral: Negative.     Objective:  BP 128/82 (BP Location: Left Arm, Patient Position: Sitting, Cuff Size: Large)   Pulse 60   Temp 98.5 F (36.9 C) (Oral)   Resp 16   Ht '5\' 8"'  (1.727 m)   Wt (!) 326 lb (147.9 kg)   LMP 07/28/2011   SpO2 99%   BMI 49.57 kg/m   BP Readings from Last 3 Encounters:  12/15/19 128/82  12/13/19 117/68  11/30/19 137/77    Wt Readings from Last 3 Encounters:  12/15/19 (!) 326 lb (147.9 kg)  12/13/19 (!) 328 lb (148.8 kg)  11/30/19 (!) 320 lb (145.2 kg)    Physical Exam Vitals reviewed.  Constitutional:      General: She is not in acute distress.    Appearance: She is obese. She is not toxic-appearing or diaphoretic.  HENT:     Nose: Nose normal.     Mouth/Throat:     Mouth: Mucous membranes are moist.     Pharynx: No oropharyngeal exudate.  Eyes:     General: No scleral icterus.    Conjunctiva/sclera: Conjunctivae normal.  Cardiovascular:     Rate and Rhythm: Normal rate and regular rhythm.     Heart sounds: No murmur heard.   Pulmonary:     Effort: Pulmonary effort is normal.     Breath sounds: No stridor. No wheezing, rhonchi or rales.  Abdominal:     General: Abdomen is protuberant. Bowel sounds are normal. There is no distension.     Palpations: Abdomen is soft. There is no hepatomegaly, splenomegaly or mass.     Tenderness: There is no abdominal tenderness.  Musculoskeletal:        General: Normal range of motion.     Cervical back: Neck supple.     Right lower leg: No edema.     Left lower leg: No edema.  Lymphadenopathy:     Cervical: No cervical adenopathy.  Skin:    General: Skin  is warm and dry.     Coloration: Skin is not pale.  Neurological:     General: No focal deficit present.     Mental Status: She is alert and oriented to person, place, and time. Mental status is at baseline.  Psychiatric:        Mood and Affect: Mood normal.        Behavior: Behavior normal.        Thought Content: Thought content normal.        Judgment: Judgment normal.     Lab Results  Component Value Date   WBC 7.5 11/30/2019   HGB 13.2 11/30/2019   HCT 43.0 11/30/2019   PLT 257 11/30/2019  GLUCOSE 379 (H) 12/15/2019   CHOL 113 12/15/2019   TRIG 106 12/15/2019   HDL 38 (L) 12/15/2019   LDLCALC 56 12/15/2019   ALT 18 04/14/2019   AST 13 04/14/2019   NA 135 12/15/2019   K 4.2 12/15/2019   CL 96 (L) 12/15/2019   CREATININE 0.99 12/15/2019   BUN 11 12/15/2019   CO2 30 12/15/2019   TSH 1.73 12/15/2019   INR 0.9 08/26/2019   HGBA1C 13.4 (A) 12/15/2019   MICROALBUR 3.9 12/15/2019    DG Chest 2 View  Result Date: 11/30/2019 CLINICAL DATA:  Shortness of breath and fever. EXAM: CHEST - 2 VIEW COMPARISON:  None. FINDINGS: Heart size is within normal limits, exaggerated by low lung volumes. No edema or effusion is present. No focal airspace disease is present. IMPRESSION: 1. Low lung volumes. 2. No acute cardiopulmonary disease. Electronically Signed   By: San Morelle M.D.   On: 11/30/2019 10:14    Assessment & Plan:   Debra [name] was seen today for diabetes and hyperlipidemia.  Diagnoses and all orders for this visit:  Uncontrolled type 2 diabetes mellitus with hyperglycemia ([place])- Her A1c is up to 13.4%.  I do not recommend taking the combination of a sulfonylurea and DPP 4 inhibitor so I have asked her to stop taking Januvia.  She will stay on the sulfonylurea.  She does not tolerate Metformin.  Her blood sugar is too high for her to start an SGLT2 inhibitor.  I recommended that she start using basal and bolus insulin. -     POCT glycosylated hemoglobin (Hb A1C) -      POCT Glucose (Device for Home Use) -     insulin degludec (TRESIBA FLEXTOUCH) 200 UNIT/ML FlexTouch Pen; Inject 50 Units into the skin daily. -     Insulin Aspart FlexPen 100 UNIT/ML SOPN; Inject 5 Units into the skin with breakfast, with lunch, and with evening meal. -     Insulin Pen Needle (NOVOFINE) 32G X 6 MM MISC; 1 Act by Does not apply route daily. -     Ambulatory referral to Endocrinology -     Amb Referral to Nutrition and Diabetic E -     Continuous Blood Gluc Receiver (FREESTYLE LIBRE 14 DAY READER) DEVI; 1 Act by Does not apply route daily. -     Continuous Blood Gluc Sensor (FREESTYLE LIBRE 14 DAY SENSOR) MISC; 1 Act by Does not apply route daily. -     Microalbumin / creatinine urine ratio; Future -     Urinalysis, Routine w reflex microscopic; Future -     BASIC METABOLIC PANEL WITH GFR; Future  Hyperlipidemia LDL goal <70- She has achieved her LDL goal and is doing well on the statin. -     Lipid panel; Future -     TSH; Future  Essential hypertension- Her blood pressure is adequately well controlled.  Electrolytes and renal function are normal. -     TSH; Future -     Urinalysis, Routine w reflex microscopic; Future -     BASIC METABOLIC PANEL WITH GFR; Future   I have discontinued [employer]'s sitaGLIPtin and benzonatate. I am also having her start on Tresiba FlexTouch, Insulin Aspart FlexPen, NovoFine, FreeStyle Libre 14 Day Reader, and [employer] 14 Day Sensor. Additionally, I am having her maintain her fexofenadine, blood glucose meter kit and supplies, albuterol, albuterol, Lancets, glucose blood, olopatadine, fluticasone, montelukast, EPINEPHrine, rosuvastatin, glipiZIDE, ticagrelor, senna, (Menthol, Topical Analgesic, (BIOFREEZE EX)), ibuprofen, and  losartan-hydrochlorothiazide.  Meds ordered this encounter  Medications  . insulin degludec (TRESIBA FLEXTOUCH) 200 UNIT/ML FlexTouch Pen    Sig: Inject 50 Units into the skin daily.    Dispense:  9 mL      Refill:  0  . Insulin Aspart FlexPen 100 UNIT/ML SOPN    Sig: Inject 5 Units into the skin with breakfast, with lunch, and with evening meal.    Dispense:  9 mL    Refill:  1  . Insulin Pen Needle (NOVOFINE) 32G X 6 MM MISC    Sig: 1 Act by Does not apply route daily.    Dispense:  100 each    Refill:  3  . Continuous Blood Gluc Receiver (FREESTYLE LIBRE 14 DAY READER) DEVI    Sig: 1 Act by Does not apply route daily.    Dispense:  2 each    Refill:  5  . Continuous Blood Gluc Sensor (FREESTYLE LIBRE 14 DAY SENSOR) MISC    Sig: 1 Act by Does not apply route daily.    Dispense:  2 each    Refill:  5   I spent 50 minutes in preparing to see the patient by review of recent labs, imaging and procedures, obtaining and reviewing separately obtained history, communicating with the patient and family or caregiver, ordering medications, tests or procedures, and documenting clinical information in the EHR including the differential Dx, treatment, and any further evaluation and other management of 1. Uncontrolled type 2 diabetes mellitus with hyperglycemia ([place]) 2. Hyperlipidemia LDL goal <70 3. Essential hypertension 4. Other fatigue     Follow-up: Return in about 3 weeks (around 01/05/2020).  Scarlette Calico, MD

## 2019-12-15 NOTE — Patient Instructions (Signed)
Type 2 Diabetes Mellitus, Diagnosis, Adult Type 2 diabetes (type 2 diabetes mellitus) is a long-term (chronic) disease. In type 2 diabetes, one or both of these problems may be present:  The pancreas does not make enough of a hormone called insulin.  Cells in the body do not respond properly to insulin that the body makes (insulin resistance). Normally, insulin allows blood sugar (glucose) to enter cells in the body. The cells use glucose for energy. Insulin resistance or lack of insulin causes excess glucose to build up in the blood instead of going into cells. As a result, high blood glucose (hyperglycemia) develops. What increases the risk? The following factors may make you more likely to develop type 2 diabetes:  Having a family member with type 2 diabetes.  Being overweight or obese.  Having an inactive (sedentary) lifestyle.  Having been diagnosed with insulin resistance.  Having a history of prediabetes, gestational diabetes, or polycystic ovary syndrome (PCOS).  Being of American-Indian, African-American, Hispanic/Latino, or Asian/Pacific Islander descent. What are the signs or symptoms? In the early stage of this condition, you may not have symptoms. Symptoms develop slowly and may include:  Increased thirst (polydipsia).  Increased hunger(polyphagia).  Increased urination (polyuria).  Increased urination during the night (nocturia).  Unexplained weight loss.  Frequent infections that keep coming back (recurring).  Fatigue.  Weakness.  Vision changes, such as blurry vision.  Cuts or bruises that are slow to heal.  Tingling or numbness in the hands or feet.  Dark patches on the skin (acanthosis nigricans). How is this diagnosed? This condition is diagnosed based on your symptoms, your medical history, a physical exam, and your blood glucose level. Your blood glucose may be checked with one or more of the following blood tests:  A fasting blood glucose (FBG)  test. You will not be allowed to eat (you will fast) for 8 hours or longer before a blood sample is taken.  A random blood glucose test. This test checks blood glucose at any time of day regardless of when you ate.  An A1c (hemoglobin A1c) blood test. This test provides information about blood glucose control over the previous 2-3 months.  An oral glucose tolerance test (OGTT). This test measures your blood glucose at two times: ? After fasting. This is your baseline blood glucose level. ? Two hours after drinking a beverage that contains glucose. You may be diagnosed with type 2 diabetes if:  Your FBG level is 126 mg/dL (7.0 mmol/L) or higher.  Your random blood glucose level is 200 mg/dL (11.1 mmol/L) or higher.  Your A1c level is 6.5% or higher.  Your OGTT result is higher than 200 mg/dL (11.1 mmol/L). These blood tests may be repeated to confirm your diagnosis. How is this treated? Your treatment may be managed by a specialist called an endocrinologist. Type 2 diabetes may be treated by following instructions from your health care provider about:  Making diet and lifestyle changes. This may include: ? Following an individualized nutrition plan that is developed by a diet and nutrition specialist (registered dietitian). ? Exercising regularly. ? Finding ways to manage stress.  Checking your blood glucose level as often as told.  Taking diabetes medicines or insulin daily. This helps to keep your blood glucose levels in the healthy range. ? If you use insulin, you may need to adjust the dosage depending on how physically active you are and what foods you eat. Your health care provider will tell you how to adjust your dosage.    Taking medicines to help prevent complications from diabetes, such as: ? Aspirin. ? Medicine to lower cholesterol. ? Medicine to control blood pressure. Your health care provider will set individualized treatment goals for you. Your goals will be based on  your age, other medical conditions you have, and how you respond to diabetes treatment. Generally, the goal of treatment is to maintain the following blood glucose levels:  Before meals (preprandial): 80-130 mg/dL (4.4-7.2 mmol/L).  After meals (postprandial): below 180 mg/dL (10 mmol/L).  A1c level: less than 7%. Follow these instructions at home: Questions to ask your health care provider  Consider asking the following questions: ? Do I need to meet with a diabetes educator? ? Where can I find a support group for people with diabetes? ? What equipment will I need to manage my diabetes at home? ? What diabetes medicines do I need, and when should I take them? ? How often do I need to check my blood glucose? ? What number can I call if I have questions? ? When is my next appointment? General instructions  Take over-the-counter and prescription medicines only as told by your health care provider.  Keep all follow-up visits as told by your health care provider. This is important.  For more information about diabetes, visit: ? American Diabetes Association (ADA): www.diabetes.org ? American Association of Diabetes Educators (AADE): www.diabeteseducator.org Contact a health care provider if:  Your blood glucose is at or above 240 mg/dL (13.3 mmol/L) for 2 days in a row.  You have been sick or have had a fever for 2 days or longer, and you are not getting better.  You have any of the following problems for more than 6 hours: ? You cannot eat or drink. ? You have nausea and vomiting. ? You have diarrhea. Get help right away if:  Your blood glucose is lower than 54 mg/dL (3.0 mmol/L).  You become confused or you have trouble thinking clearly.  You have difficulty breathing.  You have moderate or large ketone levels in your urine. Summary  Type 2 diabetes (type 2 diabetes mellitus) is a long-term (chronic) disease. In type 2 diabetes, the pancreas does not make enough of a  hormone called insulin, or cells in the body do not respond properly to insulin that the body makes (insulin resistance).  This condition is treated by making diet and lifestyle changes and taking diabetes medicines or insulin.  Your health care provider will set individualized treatment goals for you. Your goals will be based on your age, other medical conditions you have, and how you respond to diabetes treatment.  Keep all follow-up visits as told by your health care provider. This is important. This information is not intended to replace advice given to you by your health care provider. Make sure you discuss any questions you have with your health care provider. Document Revised: 06/06/2017 Document Reviewed: 05/12/2015 Elsevier Patient Education  2020 [employer] Inc.  

## 2019-12-16 LAB — URINALYSIS, ROUTINE W REFLEX MICROSCOPIC
Bacteria, UA: NONE SEEN /HPF
Bilirubin Urine: NEGATIVE
Hgb urine dipstick: NEGATIVE
Hyaline Cast: NONE SEEN /LPF
Ketones, ur: NEGATIVE
Leukocytes,Ua: NEGATIVE
Nitrite: NEGATIVE
RBC / HPF: NONE SEEN /HPF (ref 0–2)
Specific Gravity, Urine: 1.036 — ABNORMAL HIGH (ref 1.001–1.03)
pH: <=5 (ref 5.0–8.0)

## 2019-12-16 LAB — MICROALBUMIN / CREATININE URINE RATIO
Creatinine, Urine: 126 mg/dL (ref 20–275)
Microalb Creat Ratio: 31 mcg/mg creat — ABNORMAL HIGH (ref <30)
Microalb, Ur: 3.9 mg/dL

## 2019-12-16 LAB — LIPID PANEL
Cholesterol: 113 mg/dL (ref <200)
HDL: 38 mg/dL — ABNORMAL LOW (ref >=50)
LDL Cholesterol (Calc): 56 mg/dL (calc)
Non-HDL Cholesterol (Calc): 75 mg/dL (calc) (ref <130)
Total CHOL/HDL Ratio: 3 (calc) (ref <5.0)
Triglycerides: 106 mg/dL (ref <150)

## 2019-12-16 LAB — VITAMIN D 25 HYDROXY (VIT D DEFICIENCY, FRACTURES): Vit D, 25-Hydroxy: 30 ng/mL (ref 30–100)

## 2019-12-16 LAB — BASIC METABOLIC PANEL WITHOUT GFR
BUN: 11 mg/dL (ref 7–25)
CO2: 30 mmol/L (ref 20–32)
Calcium: 9.3 mg/dL (ref 8.6–10.2)
Chloride: 96 mmol/L — ABNORMAL LOW (ref 98–110)
Creat: 0.99 mg/dL (ref 0.50–1.10)
GFR, Est African American: 80 mL/min/1.73m2 (ref >=60)
GFR, Est Non African American: 69 mL/min/1.73m2 (ref >=60)
Glucose, Bld: 379 mg/dL — ABNORMAL HIGH (ref 65–99)
Potassium: 4.2 mmol/L (ref 3.5–5.3)
Sodium: 135 mmol/L (ref 135–146)

## 2019-12-16 LAB — TSH: TSH: 1.73 mIU/L

## 2019-12-16 NOTE — Telephone Encounter (Signed)
error 

## 2019-12-24 ENCOUNTER — Encounter: Payer: Self-pay | Admitting: Internal Medicine

## 2020-01-03 ENCOUNTER — Other Ambulatory Visit: Payer: Self-pay

## 2020-01-03 ENCOUNTER — Encounter: Payer: Self-pay | Admitting: Internal Medicine

## 2020-01-03 ENCOUNTER — Ambulatory Visit (INDEPENDENT_AMBULATORY_CARE_PROVIDER_SITE_OTHER): Payer: BC Managed Care – PPO | Admitting: Internal Medicine

## 2020-01-03 DIAGNOSIS — E1165 Type 2 diabetes mellitus with hyperglycemia: Secondary | ICD-10-CM | POA: Diagnosis not present

## 2020-01-03 MED ORDER — OZEMPIC (1 MG/DOSE) 4 MG/3ML ~~LOC~~ SOPN: 0.2500 mg | PEN_INJECTOR | Freq: Every day | SUBCUTANEOUS | 0 refills | Status: DC

## 2020-01-03 MED ORDER — FREESTYLE LIBRE 14 DAY SENSOR MISC: 1.0000 | Freq: Every day | 5 refills | Status: DC

## 2020-01-03 NOTE — Progress Notes (Signed)
Subjective:  Patient ID: Debra [name], female    DOB: [birth year]  Age: 44 y.o. MRN: [id number]  CC: Diabetes  This visit occurred during the SARS-CoV-2 public health emergency.  Safety protocols were in place, including screening questions prior to the visit, additional usage of staff PPE, and extensive cleaning of exam room while observing appropriate contact time as indicated for disinfecting solutions.    HPI Debra [name] presents for f/up - She tells me her blood sugars are improving, in general, but she continues to have postprandial spikes and complains of weight gain.  Outpatient Medications Prior to Visit  Medication Sig Dispense Refill  . albuterol (PROAIR HFA) 108 (90 BASE) MCG/ACT inhaler Inhale 2 puffs into the lungs every 4 (four) hours as needed for wheezing or shortness of breath. 18 g 2  . albuterol (PROVENTIL) (2.5 MG/3ML) 0.083% nebulizer solution Take 2.5 mg by nebulization every 6 (six) hours as needed for wheezing or shortness of breath.    . blood glucose meter kit and supplies Dispense based on patient and insurance preference. Use up to four times daily as directed. (FOR ICD-9 250.00, 250.01). 1 each 0  . Continuous Blood Gluc Receiver (FREESTYLE LIBRE 14 DAY READER) DEVI 1 Act by Does not apply route daily. 2 each 5  . EPINEPHrine 0.3 mg/0.3 mL IJ SOAJ injection Inject 0.3 mLs (0.3 mg total) into the muscle as needed for anaphylaxis. 1 each 1  . fexofenadine (ALLEGRA) 180 MG tablet Take 180 mg by mouth daily.     . fluticasone (FLONASE) 50 MCG/ACT nasal spray Place 2 sprays into both nostrils daily. (Patient taking differently: Place 2 sprays into both nostrils daily as needed for allergies. ) 16 g 6  . glucose blood test strip Used to check blood sugars up to four times daily as needed 200 each 3  . ibuprofen (ADVIL) 800 MG tablet Take 1 tablet (800 mg total) by mouth 3 (three) times daily. 90 tablet 0  . insulin degludec (TRESIBA FLEXTOUCH) 200 UNIT/ML  FlexTouch Pen Inject 50 Units into the skin daily. 9 mL 0  . Insulin Pen Needle (NOVOFINE) 32G X 6 MM MISC 1 Act by Does not apply route daily. 100 each 3  . Lancets MISC Used to check blood sugars up to four times daily as needed 200 each 3  . losartan-hydrochlorothiazide (HYZAAR) 100-12.5 MG tablet Take 1 tablet by mouth daily. 90 tablet 3  . Menthol, Topical Analgesic, (BIOFREEZE EX) Apply 1 application topically daily as needed (pain).    . montelukast (SINGULAIR) 10 MG tablet Take 1 tablet (10 mg total) by mouth at bedtime. 30 tablet 3  . olopatadine (PATANOL) 0.1 % ophthalmic solution Place 1 drop into both eyes 2 (two) times daily as needed for allergies.     . rosuvastatin (CRESTOR) 40 MG tablet Take 1 tablet (40 mg total) by mouth daily. 90 tablet 3  . senna (SENOKOT) 8.6 MG TABS tablet Take 1 tablet by mouth daily.    . ticagrelor (BRILINTA) 90 MG TABS tablet Take 0.5 tablets (45 mg total) by mouth 2 (two) times daily. 30 tablet 11  . Continuous Blood Gluc Sensor (FREESTYLE LIBRE 14 DAY SENSOR) MISC 1 Act by Does not apply route daily. 2 each 5  . glipiZIDE (GLUCOTROL) 10 MG tablet Take 1 tablet (10 mg total) by mouth 2 (two) times daily before a meal. 180 tablet 3  . Insulin Aspart FlexPen 100 UNIT/ML SOPN Inject 5 Units into the skin  with breakfast, with lunch, and with evening meal. 9 mL 1   No facility-administered medications prior to visit.    ROS Review of Systems  Constitutional: Positive for unexpected weight change (wt gain). Negative for appetite change, chills, diaphoresis and fatigue.  HENT: Negative.   Eyes: Negative for visual disturbance.  Respiratory: Negative for cough, chest tightness, shortness of breath and wheezing.   Cardiovascular: Negative for chest pain, palpitations and leg swelling.  Gastrointestinal: Negative for abdominal pain, constipation, diarrhea, nausea and vomiting.  Endocrine: Positive for polyphagia and polyuria. Negative for polydipsia.    Genitourinary: Negative.  Negative for difficulty urinating.  Musculoskeletal: Negative for arthralgias and myalgias.  Skin: Negative.  Negative for color change, pallor and rash.  Neurological: Negative.  Negative for dizziness, weakness, light-headedness and headaches.  Hematological: Negative for adenopathy. Does not bruise/bleed easily.  Psychiatric/Behavioral: Negative.     Objective:  BP 124/74   Pulse (!) 58   Temp 98.6 F (37 C) (Oral)   Ht '5\' 8"'  (1.727 m)   Wt (!) 342 lb (155.1 kg)   LMP 07/28/2011   SpO2 99%   BMI 52.00 kg/m   BP Readings from Last 3 Encounters:  01/03/20 124/74  12/15/19 128/82  12/13/19 117/68    Wt Readings from Last 3 Encounters:  01/03/20 (!) 342 lb (155.1 kg)  12/15/19 (!) 326 lb (147.9 kg)  12/13/19 (!) 328 lb (148.8 kg)    Physical Exam Vitals reviewed.  Constitutional:      Appearance: She is obese. She is not ill-appearing.  HENT:     Nose: Nose normal.     Mouth/Throat:     Mouth: Mucous membranes are moist.  Eyes:     General: No scleral icterus.    Conjunctiva/sclera: Conjunctivae normal.  Cardiovascular:     Rate and Rhythm: Normal rate and regular rhythm.     Heart sounds: No murmur heard.   Pulmonary:     Effort: Pulmonary effort is normal.     Breath sounds: No stridor. No wheezing, rhonchi or rales.  Abdominal:     General: Abdomen is protuberant. Bowel sounds are normal. There is no distension.     Palpations: Abdomen is soft. There is no hepatomegaly, splenomegaly or mass.  Musculoskeletal:        General: Normal range of motion.     Cervical back: Neck supple.     Right lower leg: No edema.     Left lower leg: No edema.  Lymphadenopathy:     Cervical: No cervical adenopathy.  Skin:    General: Skin is warm and dry.     Coloration: Skin is not pale.  Neurological:     General: No focal deficit present.     Mental Status: She is alert.  Psychiatric:        Mood and Affect: Mood normal.        Behavior:  Behavior normal.     Lab Results  Component Value Date   WBC 7.5 11/30/2019   HGB 13.2 11/30/2019   HCT 43.0 11/30/2019   PLT 257 11/30/2019   GLUCOSE 379 (H) 12/15/2019   CHOL 113 12/15/2019   TRIG 106 12/15/2019   HDL 38 (L) 12/15/2019   LDLCALC 56 12/15/2019   ALT 18 04/14/2019   AST 13 04/14/2019   NA 135 12/15/2019   K 4.2 12/15/2019   CL 96 (L) 12/15/2019   CREATININE 0.99 12/15/2019   BUN 11 12/15/2019   CO2 30 12/15/2019  TSH 1.73 12/15/2019   INR 0.9 08/26/2019   HGBA1C 13.4 (A) 12/15/2019   MICROALBUR 3.9 12/15/2019    DG Chest 2 View  Result Date: 11/30/2019 CLINICAL DATA:  Shortness of breath and fever. EXAM: CHEST - 2 VIEW COMPARISON:  None. FINDINGS: Heart size is within normal limits, exaggerated by low lung volumes. No edema or effusion is present. No focal airspace disease is present. IMPRESSION: 1. Low lung volumes. 2. No acute cardiopulmonary disease. Electronically Signed   By: San Morelle M.D.   On: 11/30/2019 10:14    Assessment & Plan:   Kit was seen today for diabetes.  Diagnoses and all orders for this visit:  Uncontrolled type 2 diabetes mellitus with hyperglycemia ([place])- I am concerned the sulfonylurea may be contributing to the weight gain so I recommended that she transition to a GLP-1 agonist. She is having postprandial spikes so will increase the dose of the bolus insulin to 10 units. Will continue the other glycemic agents with no changes. I have encouraged her to see diabetic education. -     Continuous Blood Gluc Sensor (FREESTYLE LIBRE 14 DAY SENSOR) MISC; 1 Act by Does not apply route daily. -     Semaglutide, 1 MG/DOSE, (OZEMPIC, 1 MG/DOSE,) 4 MG/3ML SOPN; Inject 0.25 mg into the skin daily.   I have discontinued Deola L. Jalbert's glipiZIDE. I have also changed her Insulin Aspart FlexPen. Additionally, I am having her start on Ozempic (1 MG/DOSE). Lastly, I am having her maintain her fexofenadine, blood glucose meter kit  and supplies, albuterol, albuterol, Lancets, glucose blood, olopatadine, fluticasone, montelukast, EPINEPHrine, rosuvastatin, ticagrelor, senna, (Menthol, Topical Analgesic, (BIOFREEZE EX)), ibuprofen, losartan-hydrochlorothiazide, Tresiba FlexTouch, NovoFine, FreeStyle [employer] 14 Day Reader, and [employer] 14 Day Sensor.  Meds ordered this encounter  Medications  . Continuous Blood Gluc Sensor (FREESTYLE LIBRE 14 DAY SENSOR) MISC    Sig: 1 Act by Does not apply route daily.    Dispense:  2 each    Refill:  5  . Semaglutide, 1 MG/DOSE, (OZEMPIC, 1 MG/DOSE,) 4 MG/3ML SOPN    Sig: Inject 0.25 mg into the skin daily.    Dispense:  3 mL    Refill:  0  . Insulin Aspart FlexPen 100 UNIT/ML SOPN    Sig: Inject 10 Units into the skin with breakfast, with lunch, and with evening meal.    Dispense:  9 mL    Refill:  1     Follow-up: Return in about 3 months (around 04/03/2020).  Scarlette Calico, MD

## 2020-01-03 NOTE — Patient Instructions (Signed)
Directions for Ozempic: 0.25 mg once a week for 4 weeks; 0.5 mg once a week for 4 weeks; 1.0 mg weekly for 4 weeks, 2.0 mg weekly for 4 weeks.

## 2020-01-04 ENCOUNTER — Encounter: Payer: Self-pay | Admitting: Internal Medicine

## 2020-01-04 MED ORDER — INSULIN ASPART FLEXPEN 100 UNIT/ML ~~LOC~~ SOPN: 10.0000 [IU] | PEN_INJECTOR | Freq: Three times a day (TID) | SUBCUTANEOUS | 1 refills | Status: DC

## 2020-01-05 DIAGNOSIS — Z0289 Encounter for other administrative examinations: Secondary | ICD-10-CM

## 2020-01-22 DIAGNOSIS — G4733 Obstructive sleep apnea (adult) (pediatric): Secondary | ICD-10-CM | POA: Diagnosis not present

## 2020-01-24 ENCOUNTER — Other Ambulatory Visit: Payer: Self-pay | Admitting: Internal Medicine

## 2020-01-24 ENCOUNTER — Telehealth: Payer: Self-pay | Admitting: Internal Medicine

## 2020-01-24 DIAGNOSIS — E1165 Type 2 diabetes mellitus with hyperglycemia: Secondary | ICD-10-CM

## 2020-01-24 MED ORDER — TRESIBA FLEXTOUCH 200 UNIT/ML ~~LOC~~ SOPN: 50.0000 [IU] | PEN_INJECTOR | Freq: Every day | SUBCUTANEOUS | 1 refills | Status: DC

## 2020-01-24 NOTE — Telephone Encounter (Signed)
Patient is requesting a refill on the following medication: insulin degludec (TRESIBA FLEXTOUCH) 200 UNIT/ML FlexTouch Pen  Medication was start back on 12/15/19 OV with Dr.Jones, Samples given. She states the medication is working and would like a refill if that is what Dr.Jones would like her to do.   Pharmacy on file.

## 2020-02-22 DIAGNOSIS — G4733 Obstructive sleep apnea (adult) (pediatric): Secondary | ICD-10-CM | POA: Diagnosis not present

## 2020-03-02 ENCOUNTER — Encounter: Payer: Self-pay | Admitting: Internal Medicine

## 2020-03-02 NOTE — Telephone Encounter (Signed)
Please advise 

## 2020-03-03 MED ORDER — PREDNISONE 20 MG PO TABS: 40.0000 mg | ORAL_TABLET | Freq: Every day | ORAL | 0 refills | Status: DC

## 2020-03-06 ENCOUNTER — Telehealth: Payer: Self-pay | Admitting: Adult Health

## 2020-03-06 NOTE — Telephone Encounter (Signed)
Pt called, believe ticagrelor (BRILINTA) 90 MG TABS tablet is thinning out my blood to much. I am freezing and it is not cold yet. Wandering if medication needs to be adjusted. rosuvastatin (CRESTOR) 40 MG tablet is causing me to ache all over. Would like a call from the nurse.

## 2020-03-06 NOTE — Telephone Encounter (Signed)
I called pt and I gave her Dr. Corliss Skains , Victorino Dike to aske questions re: to [facility] [phone].  Her cholesterol medication would need to be from pcp.  She appreciated this information.

## 2020-03-06 NOTE — Telephone Encounter (Signed)
Patient requesting if she can get a zpack because she is still struggling and now has pressure behind her eyes.

## 2020-03-07 ENCOUNTER — Other Ambulatory Visit: Payer: Self-pay | Admitting: Physician Assistant

## 2020-03-07 ENCOUNTER — Telehealth (INDEPENDENT_AMBULATORY_CARE_PROVIDER_SITE_OTHER): Payer: BC Managed Care – PPO | Admitting: Family Medicine

## 2020-03-07 ENCOUNTER — Other Ambulatory Visit: Payer: Self-pay

## 2020-03-07 ENCOUNTER — Telehealth (HOSPITAL_COMMUNITY): Payer: Self-pay | Admitting: Radiology

## 2020-03-07 ENCOUNTER — Ambulatory Visit (HOSPITAL_COMMUNITY)
Admission: RE | Admit: 2020-03-07 | Discharge: 2020-03-07 | Disposition: A | Discharge status: Home / Self Care | Payer: BC Managed Care – PPO | Source: Ambulatory Visit | Attending: Pulmonary Disease | Admitting: Pulmonary Disease

## 2020-03-07 ENCOUNTER — Telehealth (HOSPITAL_COMMUNITY): Payer: Self-pay

## 2020-03-07 DIAGNOSIS — U071 COVID-19: Secondary | ICD-10-CM

## 2020-03-07 DIAGNOSIS — I10 Essential (primary) hypertension: Secondary | ICD-10-CM | POA: Diagnosis not present

## 2020-03-07 DIAGNOSIS — J4520 Mild intermittent asthma, uncomplicated: Secondary | ICD-10-CM

## 2020-03-07 DIAGNOSIS — I63512 Cerebral infarction due to unspecified occlusion or stenosis of left middle cerebral artery: Secondary | ICD-10-CM

## 2020-03-07 DIAGNOSIS — J45909 Unspecified asthma, uncomplicated: Secondary | ICD-10-CM | POA: Diagnosis not present

## 2020-03-07 DIAGNOSIS — E1165 Type 2 diabetes mellitus with hyperglycemia: Secondary | ICD-10-CM | POA: Diagnosis not present

## 2020-03-07 DIAGNOSIS — G4733 Obstructive sleep apnea (adult) (pediatric): Secondary | ICD-10-CM

## 2020-03-07 MED ORDER — SOTROVIMAB 500 MG/8ML IV SOLN
500.0000 mg | Freq: Once | INTRAVENOUS | Status: AC
  Administered 2020-03-07: 500 mg via INTRAVENOUS

## 2020-03-07 MED ORDER — ACETAMINOPHEN 325 MG PO TABS
650.0000 mg | ORAL_TABLET | Freq: Once | ORAL | Status: AC
  Administered 2020-03-07: 650 mg via ORAL
  Filled 2020-03-07: qty 2

## 2020-03-07 MED ORDER — SODIUM CHLORIDE 0.9 % IV SOLN: INTRAVENOUS | Status: DC | PRN

## 2020-03-07 MED ORDER — ALBUTEROL SULFATE HFA 108 (90 BASE) MCG/ACT IN AERS: 2.0000 | INHALATION_SPRAY | Freq: Once | RESPIRATORY_TRACT | Status: DC | PRN

## 2020-03-07 MED ORDER — METHYLPREDNISOLONE SODIUM SUCC 125 MG IJ SOLR: 125.0000 mg | Freq: Once | INTRAMUSCULAR | Status: DC | PRN

## 2020-03-07 MED ORDER — EPINEPHRINE 0.3 MG/0.3ML IJ SOAJ: 0.3000 mg | Freq: Once | INTRAMUSCULAR | Status: DC | PRN

## 2020-03-07 MED ORDER — FAMOTIDINE IN NACL 20-0.9 MG/50ML-% IV SOLN: 20.0000 mg | Freq: Once | INTRAVENOUS | Status: DC | PRN

## 2020-03-07 MED ORDER — DIPHENHYDRAMINE HCL 50 MG/ML IJ SOLN: 50.0000 mg | Freq: Once | INTRAMUSCULAR | Status: DC | PRN

## 2020-03-07 NOTE — Telephone Encounter (Signed)
Called to Discuss with patient about Covid symptoms and the use of the monoclonal antibody infusion for those with mild to moderate Covid symptoms and at a high risk of hospitalization.     Pt appears to qualify for this infusion due to co-morbid conditions and/or a member of an at-risk group in accordance with the FDA Emergency Use Authorization.    Pt stated her symptoms started on 11/10, she took a home test today, 11/16, which was positive. Symptoms include cough, fever, chills, and altered taste/smell. Pt states she has a history of asthma, DM, HTN, and obesity. RN informed pt that an APP will follow up to verify information and appointment made today for 3:30pm.

## 2020-03-07 NOTE — Telephone Encounter (Signed)
    Patient reports the Prednisone has helped some but still feels coughing in increasing and sinus pressure Appointment scheduled for 11/16, Dr Selena Batten

## 2020-03-07 NOTE — Progress Notes (Signed)
I connected by phone with Debra [name] on 03/07/2020 at 2:13 PM to discuss the potential use of a new treatment for mild to moderate COVID-19 viral infection in non-hospitalized patients.  This patient is a 44 y.o. female that meets the FDA criteria for Emergency Use Authorization of COVID monoclonal antibody sotrovimab, casirivimab/imdevimab or bamlamivimab/estevimab.  Has a (+) direct SARS-CoV-2 viral test result  Has mild or moderate COVID-19   Is NOT hospitalized due to COVID-19  Is within 10 days of symptom onset  Has at least one of the high risk factor(s) for progression to severe COVID-19 and/or hospitalization as defined in EUA.  Specific high risk criteria : BMI > 25, Diabetes, Cardiovascular disease or hypertension and Chronic Lung Disease   I have spoken and communicated the following to the patient or parent/caregiver regarding COVID monoclonal antibody treatment:  1. FDA has authorized the emergency use for the treatment of mild to moderate COVID-19 in adults and pediatric patients with positive results of direct SARS-CoV-2 viral testing who are 40 years of age and older weighing at least 40 kg, and who are at high risk for progressing to severe COVID-19 and/or hospitalization.  2. The significant known and potential risks and benefits of COVID monoclonal antibody, and the extent to which such potential risks and benefits are unknown.  3. Information on available alternative treatments and the risks and benefits of those alternatives, including clinical trials.  4. Patients treated with COVID monoclonal antibody should continue to self-isolate and use infection control measures (e.g., wear mask, isolate, social distance, avoid sharing personal items, clean and disinfect "high touch" surfaces, and frequent handwashing) according to CDC guidelines.   5. The patient or parent/caregiver has the option to accept or refuse COVID monoclonal antibody treatment.  After reviewing  this information with the patient, the patient has agreed to receive one of the available covid 19 monoclonal antibodies and will be provided an appropriate fact sheet prior to infusion.  Sx onset 11/10. Set up for infusion on 11/16 @ 3:30pm. Directions given to [facility]. Pt is aware that insurance will be charged an infusion fee. Pt is unvaccinated.   [name] 03/07/2020 2:13 PM

## 2020-03-07 NOTE — Telephone Encounter (Signed)
Returned call to pt. Left VM. JM

## 2020-03-07 NOTE — Discharge Instructions (Signed)

## 2020-03-07 NOTE — Patient Instructions (Addendum)
   ---------------------------------------------------------------------------------------------------------------------------      WORK SLIP:  Patient Debra [name],  [birth year], was seen for a medical visit today, 03/07/20 . Please excuse from work according to the [facility] guidelines for a COVID like illness. We advise 10 days minimum from the onset of symptoms (02/27/2020) PLUS 1 day of no fever and improved symptoms. Will defer to employer for a sooner return to work if COVID19 testing is negative and the symptoms have resolved. Advise following CDC guidelines.   Sincerely: E-signature: Dr. Kriste Basque, DO [facility] Ph: [phone]   ------------------------------------------------------------------------------------------------------------------------------   It was nice to meet you today, and I really hope you are feeling better soon. I help [place] out with telemedicine visits on Tuesdays and Thursdays and am available for visits on those days. If you have any concerns or questions following this visit please schedule a follow up visit with your Primary Care doctor or seek care at a local urgent care clinic to avoid delays in care.    Seek in person care promptly if your symptoms worsen, new concerns arise or you are not improving with treatment. Call 911 and/or seek emergency care if you symptoms are severe or life threatening.   -stay home for a full 10 days since the onset of symptoms PLUS one day of no fever and feeling better  -call number provided for outpatient COVID19 treatment center [phone]  -can use tylenol or aleve if needed for fevers, aches and pains per instructions  -can use nasal saline a few times per day if nasal congestion, sometime a short course of Afrin nasal spray for 3 days can help as well  -stay hydrated, drink plenty of fluids and eat small healthy meals - avoid dairy  -can take 1000 IU Vit D3 and Vit C lozenges per  instructions  -check out the CDC website for more information on home care, transmission and treatment for COVID19  -follow up with your doctor in 2-3 days unless improving and feeling better

## 2020-03-07 NOTE — Progress Notes (Signed)
Virtual Visit via Telephone Note  I connected with Debra [name] on 03/07/20 at 11:20 AM EST by telephone and verified that I am speaking with the correct person using two identifiers.   I discussed the limitations, risks, security and privacy concerns of performing an evaluation and management service by telephone and the availability of in person appointments. I also discussed with the patient that there may be a patient responsible charge related to this service. The patient expressed understanding and agreed to proceed.  Location patient: home, [place] Location provider: work or home office Participants present for the call: patient, provider Patient did not have a visit with me in the prior 7 days to address this/these issue(s).   History of Present Illness:  Acute telemedicine visit for COVID19 : -Onset: 02/27/2020 -Symptoms include: nasal congestion, cough, sometimes a fever at night - low grade 100.5 -she thought was allergies and PCP provided some prednisone as pt thought it was allergies -then her mother got sick and pt found out her friend had covid so patient did a COVID19 test which was positive -Denies: SOB, CP, inability to get out bed/eat/drink, NVD -Has tried: prednisone from PCP, vit C, Vit D, ibuprofen, Flonase, Singulair, allegra -Pertinent past medical history: has asthma - has not had wheezing, SOB and CP - but has used the albuterol for the cough at times; obesity, HTN, diabetes -Pertinent medication allergies: allergies -COVID-19 vaccine status: not vaccinated   Observations/Objective: Patient sounds cheerful and well on the phone. I do not appreciate any SOB. Speech and thought processing are grossly intact. Patient reported vitals:  Assessment and Plan:  COVID-19  Asthma, unspecified asthma severity, unspecified whether complicated, unspecified whether persistent  -we discussed possible serious and likely etiologies, options for evaluation and workup,  limitations of telemedicine visit vs in person visit, treatment, treatment risks and precautions. Pt prefers to treat via telemedicine empirically rather than in person at this moment.  Discussed treatment options for Covid, potential complications, precautions and home isolation.  She is interested in Mab even though she is over a week into her illness.  Sent a message to the [facility] treatment center and provided patient with the contact information.  Discussed symptomatic care and asthma treatment.  She has already been prescribed prednisone and Tessalon and is using those.  She actually feels that she is doing okay in terms of her asthma and breathing.  Discussed proper use of her inhalers.  Discussed symptomatic care measures. Work/School slipped offered: provided in patient instructions  Scheduled follow up with PCP offered: She agrees to follow-up if needed. Advised to seek prompt in person care if worsening, new symptoms arise, or if is not improving with treatment. Advised of options for inperson care in case PCP office not available. Did let the patient know that I only do telemedicine shifts for [place] on Tuesdays and Thursdays and advised a follow up visit with PCP or at an [facility] if has further questions or concerns.   Follow Up Instructions:  I did not refer this patient for an OV with me in the next 24 hours for this/these issue(s).  I discussed the assessment and treatment plan with the patient. The patient was provided an opportunity to ask questions and all were answered. The patient agreed with the plan and demonstrated an understanding of the instructions.   I spent 22 minutes on this encounter.   Terressa Koyanagi, DO

## 2020-03-07 NOTE — Progress Notes (Signed)
  Diagnosis: COVID-19  Physician: Dr. Shan Levans  Procedure: Allergies reviewed.  Sotrovimab administered via IV infusion after med fact sheet provided to patient and all questions answered. Discharge instructions provided to patient; all questions answered.   Complications: No immediate complications noted.  Temp improved at d/c, but elevated; pt will continue Tylenol every 6 hours.  Discharge: Discharged home   Gregary Signs 03/07/2020

## 2020-03-13 ENCOUNTER — Telehealth (INDEPENDENT_AMBULATORY_CARE_PROVIDER_SITE_OTHER): Payer: BC Managed Care – PPO | Admitting: Internal Medicine

## 2020-03-13 ENCOUNTER — Encounter: Payer: Self-pay | Admitting: Internal Medicine

## 2020-03-13 DIAGNOSIS — U071 COVID-19: Secondary | ICD-10-CM | POA: Insufficient documentation

## 2020-03-13 MED ORDER — PREDNISONE 20 MG PO TABS
40.0000 mg | ORAL_TABLET | Freq: Every day | ORAL | 0 refills | Status: DC
Start: 2020-03-13 — End: 2020-04-10

## 2020-03-13 NOTE — Assessment & Plan Note (Signed)
Rx prednisone. She has completed monoclonal antibody treatment.

## 2020-03-13 NOTE — Progress Notes (Signed)
Virtual Visit via Audio Note  I connected with Debra [name] on 03/13/20 at  2:20 PM EST by an audio-only enabled telemedicine application and verified that I am speaking with the correct person using two identifiers.  The patient and the provider were at separate locations throughout the entire encounter. Patient location: home, Provider location: work   I discussed the limitations of evaluation and management by telemedicine and the availability of in person appointments. The patient expressed understanding and agreed to proceed. The patient and the provider were the only parties present for the visit unless noted in HPI below.  History of Present Illness: The patient is a 44 y.o. female with visit for covid-19 positive. Got monoclonal antibody last week on Monday. She is finally starting to feel better. Fever broke about 2-3 days ago. Started taking prednisone as prescribed initially before she knew it was covid-19. She was having tightness which is now going away. She has done incentive spirometry which she feels helps. Overall she is turning a corner but not well yet. She is not vaccinated against covid-19 due to reaction in the past against vaccines.  Observations/Objective: A and O times 3, voice strong, cough noted in the background during visit but able to speak in full sentences  Assessment and Plan: See problem oriented charting  Follow Up Instructions: refill prednisone given concurrent asthma to help with SOB and recovery, given timeline for typical recovery as well as warning signs to watch for, advised unable to get covid-19 vaccination even if desired until 3 months after monoclonal antibody treatment  Visit time 15 minutes in non-face to face communication with patient and coordination of care   I discussed the assessment and treatment plan with the patient. The patient was provided an opportunity to ask questions and all were answered. The patient agreed with the plan and  demonstrated an understanding of the instructions.   The patient was advised to call back or seek an in-person evaluation if the symptoms worsen or if the condition fails to improve as anticipated.  Myrlene Broker, MD

## 2020-03-14 DIAGNOSIS — G4733 Obstructive sleep apnea (adult) (pediatric): Secondary | ICD-10-CM | POA: Diagnosis not present

## 2020-03-23 DIAGNOSIS — G4733 Obstructive sleep apnea (adult) (pediatric): Secondary | ICD-10-CM | POA: Diagnosis not present

## 2020-04-03 ENCOUNTER — Ambulatory Visit: Payer: BC Managed Care – PPO | Admitting: Internal Medicine

## 2020-04-03 DIAGNOSIS — Z1152 Encounter for screening for COVID-19: Secondary | ICD-10-CM | POA: Diagnosis not present

## 2020-04-04 ENCOUNTER — Telehealth (HOSPITAL_COMMUNITY): Payer: Self-pay | Admitting: Radiology

## 2020-04-04 ENCOUNTER — Other Ambulatory Visit (HOSPITAL_COMMUNITY): Payer: Self-pay | Admitting: Interventional Radiology

## 2020-04-04 DIAGNOSIS — I771 Stricture of artery: Secondary | ICD-10-CM

## 2020-04-04 NOTE — Telephone Encounter (Signed)
Pt asking if she can stop Brilinta? Per Corliss Skains will decide after her f/u CTA on 04/24/20. Pt agrees. JM

## 2020-04-10 ENCOUNTER — Encounter: Payer: Self-pay | Admitting: Internal Medicine

## 2020-04-10 ENCOUNTER — Other Ambulatory Visit: Payer: Self-pay

## 2020-04-10 ENCOUNTER — Ambulatory Visit (INDEPENDENT_AMBULATORY_CARE_PROVIDER_SITE_OTHER): Payer: BC Managed Care – PPO | Admitting: Internal Medicine

## 2020-04-10 VITALS — BP 118/70 | HR 57 | Temp 98.3°F | Ht 68.0 in | Wt 339.4 lb

## 2020-04-10 DIAGNOSIS — E1165 Type 2 diabetes mellitus with hyperglycemia: Secondary | ICD-10-CM | POA: Diagnosis not present

## 2020-04-10 DIAGNOSIS — U071 COVID-19: Secondary | ICD-10-CM

## 2020-04-10 DIAGNOSIS — J4520 Mild intermittent asthma, uncomplicated: Secondary | ICD-10-CM

## 2020-04-10 MED ORDER — SOLIQUA 100-33 UNT-MCG/ML ~~LOC~~ SOPN: 30.0000 [IU] | PEN_INJECTOR | Freq: Every day | SUBCUTANEOUS | 3 refills | Status: DC

## 2020-04-10 NOTE — Progress Notes (Signed)
   Subjective:   Patient ID: Debra [name], female    DOB: February 10, 1976, 44 y.o.   MRN: [id number]  HPI The patient is a 44 YO female coming in for follow up covid-19 (got monoclonal antibody 03/07/20, overall doing better, still some fatigue and mild SOB with exertion, denies significant cough, smell and taste fine) and diabetes (sugars are doing okay, higher overall in the past several months due to dietary changes, she is wanting to get back on track, had some changes with another provider recently and is taking tresiba and ozempic and is wondering if we can simplify the number of shots, denies low sugars) and asthma (doing well overall, recovered from covid-19 fine, still some SOB with exertion and mild cough).   Review of Systems  Constitutional: Negative.   HENT: Negative.   Eyes: Negative.   Respiratory: Positive for shortness of breath. Negative for cough and chest tightness.   Cardiovascular: Negative for chest pain, palpitations and leg swelling.  Gastrointestinal: Negative for abdominal distention, abdominal pain, constipation, diarrhea, nausea and vomiting.  Musculoskeletal: Negative.   Skin: Negative.   Neurological: Negative.   Psychiatric/Behavioral: Negative.     Objective:  Physical Exam Constitutional:      Appearance: She is well-developed and well-nourished. She is obese.  HENT:     Head: Normocephalic and atraumatic.  Eyes:     Extraocular Movements: EOM normal.  Cardiovascular:     Rate and Rhythm: Normal rate and regular rhythm.  Pulmonary:     Effort: Pulmonary effort is normal. No respiratory distress.     Breath sounds: Normal breath sounds. No wheezing or rales.  Abdominal:     General: Bowel sounds are normal. There is no distension.     Palpations: Abdomen is soft.     Tenderness: There is no abdominal tenderness. There is no rebound.  Musculoskeletal:        General: No edema.     Cervical back: Normal range of motion.  Skin:    General: Skin is  warm and dry.  Neurological:     Mental Status: She is alert and oriented to person, place, and time.     Coordination: Coordination normal.  Psychiatric:        Mood and Affect: Mood and affect normal.     Vitals:   04/10/20 1413  BP: 118/70  Pulse: (!) 57  Temp: 98.3 F (36.8 C)  TempSrc: Oral  SpO2: 99%  Weight: (!) 339 lb 6.4 oz (154 kg)  Height: 5\' 8"  (1.727 m)    This visit occurred during the SARS-CoV-2 public health emergency.  Safety protocols were in place, including screening questions prior to the visit, additional usage of staff PPE, and extensive cleaning of exam room while observing appropriate contact time as indicated for disinfecting solutions.   Assessment & Plan:

## 2020-04-10 NOTE — Patient Instructions (Addendum)
We will have you start doing soliqua at 30 units a day. Every 2-3 days if the morning sugar is >150 go up by 2 units until maximum 50 units daily. Stop taking the tresiba and ozempic.  Monitor the sugars and we can try doing the mealtime with the largest 1-2 meals per day and see how that does to cut down on the number of shots per day.   Let us know how this is working.

## 2020-04-12 ENCOUNTER — Encounter: Payer: Self-pay | Admitting: Internal Medicine

## 2020-04-12 LAB — POCT GLYCOSYLATED HEMOGLOBIN (HGB A1C)

## 2020-04-12 NOTE — Assessment & Plan Note (Signed)
Stable and recovered well from covid-19. Using albuterol prn either inhaler or nebulizer. Taking flonase and allegra and singulair.

## 2020-04-12 NOTE — Assessment & Plan Note (Addendum)
Will change tresiba and ozempic to soliqua. She is getting too much appetite suppression with ozempic and forgetting to eat without low sugars but feeling like she needs to eat to keep sugars up and not eating as healthy due to this. She has had some dietary problems lately and is committed to getting back on track. Rx soliqua with titration instructions. She will also continue mealtime insulin with 1-2 biggest meals of the day to help limit injection amounts. POC HgA1c done in office which is improved but still not at goal.

## 2020-04-12 NOTE — Assessment & Plan Note (Signed)
Feeling improved. Lungs are clear on exam. She did receive monoclonal antibody and is aware that she is unable to have vaccination within 90 days of this treatment.

## 2020-04-23 DIAGNOSIS — G4733 Obstructive sleep apnea (adult) (pediatric): Secondary | ICD-10-CM | POA: Diagnosis not present

## 2020-04-24 ENCOUNTER — Ambulatory Visit (HOSPITAL_COMMUNITY): Payer: BC Managed Care – PPO

## 2020-04-24 ENCOUNTER — Ambulatory Visit (HOSPITAL_COMMUNITY): Admission: RE | Admit: 2020-04-24 | Payer: BC Managed Care – PPO | Source: Ambulatory Visit

## 2020-05-04 ENCOUNTER — Encounter: Payer: Self-pay | Admitting: Internal Medicine

## 2020-05-10 ENCOUNTER — Encounter: Payer: Self-pay | Admitting: Internal Medicine

## 2020-05-24 DIAGNOSIS — G4733 Obstructive sleep apnea (adult) (pediatric): Secondary | ICD-10-CM | POA: Diagnosis not present
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
## 2020-05-30 ENCOUNTER — Telehealth: Payer: Self-pay | Admitting: Internal Medicine

## 2020-05-30 DIAGNOSIS — E1165 Type 2 diabetes mellitus with hyperglycemia: Secondary | ICD-10-CM

## 2020-05-30 MED ORDER — FREESTYLE LIBRE 14 DAY SENSOR MISC: 1.0000 | Freq: Every day | 5 refills | Status: DC

## 2020-05-30 NOTE — Telephone Encounter (Signed)
1.Medication Requested: Continuous Blood Gluc Sensor (FREESTYLE LIBRE 14 DAY SENSOR) MISC    2. Pharmacy (Name, Street, [place]): [facility] [id number] [place], [place] - [address]  3. On Med List: yes   4. Last Visit with PCP:  12.20.21  5. Next visit date with PCP: 3.21.22   Agent: Please be advised that RX refills may take up to 3 business days. We ask that you follow-up with your pharmacy.

## 2020-05-30 NOTE — Telephone Encounter (Signed)
Medication has been sent to the patient's pharmacy.  

## 2020-06-15 DIAGNOSIS — F4325 Adjustment disorder with mixed disturbance of emotions and conduct: Secondary | ICD-10-CM | POA: Diagnosis not present

## 2020-06-21 DIAGNOSIS — G4733 Obstructive sleep apnea (adult) (pediatric): Secondary | ICD-10-CM | POA: Diagnosis not present

## 2020-06-29 DIAGNOSIS — F332 Major depressive disorder, recurrent severe without psychotic features: Secondary | ICD-10-CM | POA: Diagnosis not present

## 2020-07-06 DIAGNOSIS — F332 Major depressive disorder, recurrent severe without psychotic features: Secondary | ICD-10-CM | POA: Diagnosis not present

## 2020-07-10 ENCOUNTER — Encounter: Payer: Self-pay | Admitting: Internal Medicine

## 2020-07-10 ENCOUNTER — Ambulatory Visit (INDEPENDENT_AMBULATORY_CARE_PROVIDER_SITE_OTHER): Payer: BC Managed Care – PPO | Admitting: Internal Medicine

## 2020-07-10 ENCOUNTER — Ambulatory Visit: Payer: BC Managed Care – PPO | Admitting: Internal Medicine

## 2020-07-10 ENCOUNTER — Other Ambulatory Visit: Payer: Self-pay

## 2020-07-10 VITALS — BP 128/90 | HR 61 | Temp 99.1°F | Resp 18 | Ht 68.0 in | Wt 339.2 lb

## 2020-07-10 DIAGNOSIS — M8949 Other hypertrophic osteoarthropathy, multiple sites: Secondary | ICD-10-CM

## 2020-07-10 DIAGNOSIS — E1169 Type 2 diabetes mellitus with other specified complication: Secondary | ICD-10-CM | POA: Diagnosis not present

## 2020-07-10 DIAGNOSIS — E1165 Type 2 diabetes mellitus with hyperglycemia: Secondary | ICD-10-CM

## 2020-07-10 DIAGNOSIS — M159 Polyosteoarthritis, unspecified: Secondary | ICD-10-CM

## 2020-07-10 DIAGNOSIS — E785 Hyperlipidemia, unspecified: Secondary | ICD-10-CM | POA: Diagnosis not present

## 2020-07-10 LAB — COMPREHENSIVE METABOLIC PANEL
ALT: 11 U/L (ref 0–35)
AST: 10 U/L (ref 0–37)
Albumin: 4 g/dL (ref 3.5–5.2)
Alkaline Phosphatase: 80 U/L (ref 39–117)
BUN: 15 mg/dL (ref 6–23)
CO2: 29 mEq/L (ref 19–32)
Calcium: 9.2 mg/dL (ref 8.4–10.5)
Chloride: 101 mEq/L (ref 96–112)
Creatinine, Ser: 0.9 mg/dL (ref 0.40–1.20)
GFR: 76.83 mL/min (ref >60.00)
Glucose, Bld: 224 mg/dL — ABNORMAL HIGH (ref 70–99)
Potassium: 3.6 mEq/L (ref 3.5–5.1)
Sodium: 138 mEq/L (ref 135–145)
Total Bilirubin: 0.3 mg/dL (ref 0.2–1.2)
Total Protein: 6.8 g/dL (ref 6.0–8.3)

## 2020-07-10 LAB — CBC
HCT: 39.9 % (ref 36.0–46.0)
Hemoglobin: 12.9 g/dL (ref 12.0–15.0)
MCHC: 32.4 g/dL (ref 30.0–36.0)
MCV: 81 fl (ref 78.0–100.0)
Platelets: 315 10*3/uL (ref 150.0–400.0)
RBC: 4.93 Mil/uL (ref 3.87–5.11)
RDW: 13.5 % (ref 11.5–15.5)
WBC: 8 10*3/uL (ref 4.0–10.5)

## 2020-07-10 LAB — LIPID PANEL
Cholesterol: 138 mg/dL (ref 0–200)
HDL: 51 mg/dL (ref >39.00)
LDL Cholesterol: 59 mg/dL (ref 0–99)
NonHDL: 87.08
Total CHOL/HDL Ratio: 3
Triglycerides: 138 mg/dL (ref 0.0–149.0)
VLDL: 27.6 mg/dL (ref 0.0–40.0)

## 2020-07-10 LAB — POCT GLYCOSYLATED HEMOGLOBIN (HGB A1C): Hemoglobin A1C: 10.2 % — AB (ref 4.0–5.6)

## 2020-07-10 MED ORDER — IBUPROFEN-FAMOTIDINE 800-26.6 MG PO TABS: 1.0000 | ORAL_TABLET | Freq: Two times a day (BID) | ORAL | 3 refills | Status: DC | PRN

## 2020-07-10 NOTE — Progress Notes (Signed)
° °  Subjective:   Patient ID: Debra [name], female    DOB: Mar 29, 1976, 45 y.o.   MRN: [id number]  HPI  The patient is a 45 YO female coming in for follow up diabetes (taking her novology 5 units with meals instead of 10, she just noticed this, and doing tresiba as well, morning sugars are getting more consistent in the mid 100s, after meal sugars after getting too high, using freestyle libre and has data with her today for review, after dinner meal is typically worse and spikes to mid 200s often) and cholesterol (she is getting a lot of body pain from the crestor, she would like to decrease dose if possible, has made some improvements to diet, not currently exercising) and joint pain (using ibuprofen otc but previously had been prescribed duexis and this worked better for her, she would like to retry, denies numbness or weakness in legs, pain in back and knees).  POC HgA1c 10.2  Review of Systems  Constitutional: Negative.   HENT: Negative.   Eyes: Negative.   Respiratory: Negative for cough, chest tightness and shortness of breath.   Cardiovascular: Negative for chest pain, palpitations and leg swelling.  Gastrointestinal: Negative for abdominal distention, abdominal pain, constipation, diarrhea, nausea and vomiting.  Musculoskeletal: Positive for arthralgias, back pain and myalgias.  Skin: Negative.   Neurological: Negative.   Psychiatric/Behavioral: Negative.     Objective:  Physical Exam Constitutional:      Appearance: She is well-developed. She is obese.  HENT:     Head: Normocephalic and atraumatic.  Cardiovascular:     Rate and Rhythm: Normal rate and regular rhythm.  Pulmonary:     Effort: Pulmonary effort is normal. No respiratory distress.     Breath sounds: Normal breath sounds. No wheezing or rales.  Abdominal:     General: Bowel sounds are normal. There is no distension.     Palpations: Abdomen is soft.     Tenderness: There is no abdominal tenderness. There is no  rebound.  Musculoskeletal:     Cervical back: Normal range of motion.  Skin:    General: Skin is warm and dry.  Neurological:     Mental Status: She is alert and oriented to person, place, and time. Mental status is at baseline.     Coordination: Coordination normal.     Vitals:   07/10/20 1322  BP: 128/90  Pulse: 61  Resp: 18  Temp: 99.1 F (37.3 C)  TempSrc: Oral  SpO2: 99%  Weight: (!) 339 lb 3.2 oz (153.9 kg)  Height: 5\' 8"  (1.727 m)    This visit occurred during the SARS-CoV-2 public health emergency.  Safety protocols were in place, including screening questions prior to the visit, additional usage of staff PPE, and extensive cleaning of exam room while observing appropriate contact time as indicated for disinfecting solutions.   Assessment & Plan:

## 2020-07-10 NOTE — Patient Instructions (Addendum)
We will have you increase the mealtime insulin to 10 units for a normal size meal and 5 units for a small meal.  We can cut the crestor in half to see if this helps the muscles.

## 2020-07-12 NOTE — Assessment & Plan Note (Signed)
Rx duexis for pain. Prior renal function normal and will continue with monitoring.

## 2020-07-12 NOTE — Assessment & Plan Note (Signed)
Checking lipid panel and will decrease crestor to 20 mg daily for 2-3 weeks to see if her body pains improve. Given her history of stroke she understands that she should be on some cholesterol medication for life.

## 2020-07-12 NOTE — Assessment & Plan Note (Signed)
POC HgA1c done today at 10.2. On review of libre data morning sugars are staying closer to goal recently. We need to increase novolog with meals as her post-prandial sugars are going too high for too long which is likely driving her WVP7T being higher than previous. She was prescribed soliqua at last visit but she did not get this for some reason. Keep tresiba dosing the same for now. Advised her that we may need another agent as her sugars are still very elevated and she wants to adjust this first.

## 2020-07-22 DIAGNOSIS — G4733 Obstructive sleep apnea (adult) (pediatric): Secondary | ICD-10-CM | POA: Diagnosis not present

## 2020-08-11 ENCOUNTER — Telehealth: Payer: Self-pay | Admitting: Internal Medicine

## 2020-08-11 ENCOUNTER — Other Ambulatory Visit: Payer: Self-pay | Admitting: Internal Medicine

## 2020-08-11 MED ORDER — TRESIBA FLEXTOUCH 200 UNIT/ML ~~LOC~~ SOPN
50.0000 [IU] | PEN_INJECTOR | Freq: Every day | SUBCUTANEOUS | 10 refills | Status: DC
Start: 2020-08-11 — End: 2020-10-10

## 2020-08-11 NOTE — Addendum Note (Signed)
Addended by: Manuela Schwartz on: 08/11/2020 04:21 PM   Modules accepted: Orders

## 2020-08-11 NOTE — Telephone Encounter (Signed)
Ok to refill? Please advise.  

## 2020-08-11 NOTE — Telephone Encounter (Signed)
Medication has been sent to the patient's pharmacy.  

## 2020-08-11 NOTE — Telephone Encounter (Signed)
Ok to refill 

## 2020-08-11 NOTE — Telephone Encounter (Signed)
Patient states she is completely out.

## 2020-08-15 ENCOUNTER — Telehealth (INDEPENDENT_AMBULATORY_CARE_PROVIDER_SITE_OTHER): Payer: BC Managed Care – PPO | Admitting: Internal Medicine

## 2020-08-15 ENCOUNTER — Encounter: Payer: Self-pay | Admitting: Internal Medicine

## 2020-08-15 DIAGNOSIS — J301 Allergic rhinitis due to pollen: Secondary | ICD-10-CM | POA: Diagnosis not present

## 2020-08-15 MED ORDER — PREDNISONE 20 MG PO TABS: 40.0000 mg | ORAL_TABLET | Freq: Every day | ORAL | 0 refills | Status: DC

## 2020-08-15 MED ORDER — IBUPROFEN 800 MG PO TABS: 800.0000 mg | ORAL_TABLET | Freq: Three times a day (TID) | ORAL | 1 refills | Status: DC | PRN

## 2020-08-15 NOTE — Progress Notes (Signed)
Virtual Visit via Video Note  I connected with Debra [name] on 08/15/20 at  8:20 AM EDT by a video enabled telemedicine application and verified that I am speaking with the correct person using two identifiers.  The patient and the provider were at separate locations throughout the entire encounter. Patient location: home, Provider location: work   I discussed the limitations of evaluation and management by telemedicine and the availability of in person appointments. The patient expressed understanding and agreed to proceed. The patient and the provider were the only parties present for the visit unless noted in HPI below.  History of Present Illness: The patient is a 45 y.o. female with visit for scratchy throat. Started after baseball game on Thursday.  Has some drainage. Denies fevers or chills. Denies SOB or breathing problems. Minimal cough to clear throat. Overall it is stable. Has tried mucinex and has taken her normal allergy medications without stopping. Had some leftover prednisone and took a day or two's worth of this and it is helping significantly. She does have asthma concurrent and is very concerned about going into asthma flare as this has happened on multiple occasions before in similar circumstances.   Observations/Objective: Appearance: normal, breathing appears normal, voice hoarse, minimal coughing during visit, casual grooming, abdomen does not appear distended, throat not well visualized, mental status is A and O times 3  Assessment and Plan: See problem oriented charting  Follow Up Instructions: rx prednisone, ibuprofen 800 mg TID prn  I discussed the assessment and treatment plan with the patient. The patient was provided an opportunity to ask questions and all were answered. The patient agreed with the plan and demonstrated an understanding of the instructions.   The patient was advised to call back or seek an in-person evaluation if the symptoms worsen or if the  condition fails to improve as anticipated.  Myrlene Broker, MD

## 2020-08-15 NOTE — Assessment & Plan Note (Signed)
With severe allergic reaction currently to pollen at baseball game. Given concurrent asthma will treat with prednisone to avoid flare.

## 2020-08-21 DIAGNOSIS — G4733 Obstructive sleep apnea (adult) (pediatric): Secondary | ICD-10-CM | POA: Diagnosis not present

## 2020-09-20 ENCOUNTER — Encounter: Payer: Self-pay | Admitting: Internal Medicine

## 2020-09-20 DIAGNOSIS — E1165 Type 2 diabetes mellitus with hyperglycemia: Secondary | ICD-10-CM

## 2020-10-10 ENCOUNTER — Encounter: Payer: Self-pay | Admitting: Internal Medicine

## 2020-10-10 ENCOUNTER — Ambulatory Visit (INDEPENDENT_AMBULATORY_CARE_PROVIDER_SITE_OTHER): Payer: BC Managed Care – PPO | Admitting: Internal Medicine

## 2020-10-10 ENCOUNTER — Other Ambulatory Visit: Payer: Self-pay

## 2020-10-10 VITALS — BP 124/68 | HR 66 | Temp 98.4°F | Resp 18 | Ht 68.0 in | Wt 350.4 lb

## 2020-10-10 DIAGNOSIS — N611 Abscess of the breast and nipple: Secondary | ICD-10-CM | POA: Diagnosis not present

## 2020-10-10 DIAGNOSIS — M8949 Other hypertrophic osteoarthropathy, multiple sites: Secondary | ICD-10-CM

## 2020-10-10 DIAGNOSIS — E1165 Type 2 diabetes mellitus with hyperglycemia: Secondary | ICD-10-CM | POA: Diagnosis not present

## 2020-10-10 DIAGNOSIS — M159 Polyosteoarthritis, unspecified: Secondary | ICD-10-CM

## 2020-10-10 MED ORDER — FLUCONAZOLE 150 MG PO TABS: 150.0000 mg | ORAL_TABLET | Freq: Once | ORAL | 0 refills | Status: AC

## 2020-10-10 MED ORDER — SITAGLIPTIN PHOSPHATE 100 MG PO TABS
100.0000 mg | ORAL_TABLET | Freq: Every day | ORAL | 1 refills | Status: DC
Start: 2020-10-10 — End: 2021-02-27

## 2020-10-10 MED ORDER — GLIPIZIDE ER 5 MG PO TB24: 5.0000 mg | ORAL_TABLET | Freq: Every day | ORAL | 1 refills | Status: DC

## 2020-10-10 MED ORDER — SULFAMETHOXAZOLE-TRIMETHOPRIM 800-160 MG PO TABS: 1.0000 | ORAL_TABLET | Freq: Two times a day (BID) | ORAL | 0 refills | Status: DC

## 2020-10-10 MED ORDER — IBUPROFEN-FAMOTIDINE 800-26.6 MG PO TABS: 1.0000 | ORAL_TABLET | Freq: Three times a day (TID) | ORAL | 6 refills | Status: DC | PRN

## 2020-10-10 NOTE — Patient Instructions (Signed)
We have sent in the glipizide 5 mg daily and januvia 100 mg daily.

## 2020-10-10 NOTE — Assessment & Plan Note (Signed)
Weight is increased and she is working on diet and exercise to help.

## 2020-10-10 NOTE — Assessment & Plan Note (Signed)
Since she has changed regimen recently HgA1c will not be indicative of those changes. Will try glipizide 5 mg daily and januvia 100 mg daily and see her back in 3 months and adjust as needed then. We have placed referral to Dr. Talmage Nap as requested and she has not seen her yet. Have removed tresiba and novolog from list as she is no longer taking. Is on ARB and statin.

## 2020-10-10 NOTE — Progress Notes (Signed)
   Subjective:   Patient ID: Debra [name], female    DOB: 1975-08-25, 45 y.o.   MRN: [id number]  HPI The patient is a 45 YO female coming in for follow up diabetes (stopped all insulins due to 20 pound weight gain, is taking old rx of glipizide 5 mg daily and januvia 100 mg daily, is exercising in the pool and watching diet for a little while now), and abscess right breast (started less than a week ago, not painful yet but gets these from time to time, denies fevers or chills), and knee pain (with the weight gain she is having left and right knee pain, previously more right, she is down 2 pounds since stopping insulin and is working toward further weight loss, would like refill duexis).   Review of Systems  Constitutional:  Positive for activity change and unexpected weight change. Negative for appetite change.  HENT: Negative.    Eyes: Negative.   Respiratory:  Negative for cough, chest tightness and shortness of breath.   Cardiovascular:  Negative for chest pain, palpitations and leg swelling.  Gastrointestinal:  Negative for abdominal distention, abdominal pain, constipation, diarrhea, nausea and vomiting.  Musculoskeletal:  Positive for arthralgias and myalgias.  Skin: Negative.   Neurological: Negative.   Psychiatric/Behavioral: Negative.     Objective:  Physical Exam Constitutional:      Appearance: She is well-developed. She is obese.  HENT:     Head: Normocephalic and atraumatic.  Cardiovascular:     Rate and Rhythm: Normal rate and regular rhythm.  Pulmonary:     Effort: Pulmonary effort is normal. No respiratory distress.     Breath sounds: Normal breath sounds. No wheezing or rales.     Comments: Right breast with abscess, hard, not amenable to I and D Abdominal:     General: Bowel sounds are normal. There is no distension.     Palpations: Abdomen is soft.     Tenderness: There is no abdominal tenderness. There is no rebound.  Musculoskeletal:     Cervical back: Normal  range of motion.  Skin:    General: Skin is warm and dry.  Neurological:     Mental Status: She is alert and oriented to person, place, and time.     Coordination: Coordination abnormal.     Comments: Slow to stand, steady gait    Vitals:   10/10/20 1324  BP: 124/68  Pulse: 66  Resp: 18  Temp: 98.4 F (36.9 C)  TempSrc: Rectal  SpO2: 99%  Weight: (!) 350 lb 6.4 oz (158.9 kg)  Height: 5\' 8"  (1.727 m)    This visit occurred during the SARS-CoV-2 public health emergency.  Safety protocols were in place, including screening questions prior to the visit, additional usage of staff PPE, and extensive cleaning of exam room while observing appropriate contact time as indicated for disinfecting solutions.   Assessment & Plan:

## 2020-10-10 NOTE — Assessment & Plan Note (Signed)
Advised her to try warm compress. If not resolved did rx bactrim to take. Rx diflucan as she typically gets yeast infection with antibiotics.

## 2020-10-10 NOTE — Assessment & Plan Note (Signed)
Rx duexis for pain and she is working on weight loss.

## 2020-10-31 DIAGNOSIS — G4733 Obstructive sleep apnea (adult) (pediatric): Secondary | ICD-10-CM | POA: Diagnosis not present

## 2020-11-21 DIAGNOSIS — E7800 Pure hypercholesterolemia, unspecified: Secondary | ICD-10-CM | POA: Diagnosis not present

## 2020-11-21 DIAGNOSIS — E1165 Type 2 diabetes mellitus with hyperglycemia: Secondary | ICD-10-CM | POA: Diagnosis not present

## 2020-11-21 DIAGNOSIS — I10 Essential (primary) hypertension: Secondary | ICD-10-CM | POA: Diagnosis not present

## 2020-12-11 NOTE — Patient Instructions (Signed)
Please continue using your CPAP regularly. While your insurance requires that you use CPAP at least 4 hours each night on 70% of the nights, I recommend, that you not skip any nights and use it throughout the night if you can. Getting used to CPAP and staying with the treatment long term does take time and patience and discipline. Untreated obstructive sleep apnea when it is moderate to severe can have an adverse impact on cardiovascular health and raise her risk for heart disease, arrhythmias, hypertension, congestive heart failure, stroke and diabetes. Untreated obstructive sleep apnea causes sleep disruption, nonrestorative sleep, and sleep deprivation. This can have an impact on your day to day functioning and cause daytime sleepiness and impairment of cognitive function, memory loss, mood disturbance, and problems focussing. Using CPAP regularly can improve these symptoms.   Please continue close follow up with PCP for stroke prevention. Follow up with me in 1 year.

## 2020-12-11 NOTE — Progress Notes (Signed)
PATIENT: Debra [name] DOB: 18-Oct-1975  REASON FOR VISIT: follow up HISTORY FROM: patient  Chief Complaint  Patient presents with   Obstructive Sleep Apnea    New rm, alone. Here for yearly cpap f/u, pt reports doing well. No issues or concerns. Pt states she seems to be getting to much supply.       HISTORY OF PRESENT ILLNESS: 12/12/20 ALL: Debra [name] returns for follow up for OSA on CPAP. She reports doing well with CPAP. She is using therapy every night. She denies concerns with machine. She feels DME sends supplies too often.   She is followed closely by PCP for stroke prevention. She continues rosuvastatin and Brillenta. BP is well managed. LDL 59 and HbA1C 10.2 in 06/2020. She was recently referred to endocrinology. She was last seen 2 weeks ago.     12/13/2019 ALL:  Debra [name] is a 45 y.o. female here today for follow up for OSA on CPAP.  Debra [name] is doing very well on CPAP therapy.  She continues to use her CPAP nightly.  She reports that she has had a change in insurance and has not been able to get new supplies.  We will assist with a prescription for new supplies today.  She is feeling well and without any concerns.  Compliance report dated 11/09/2019 through 12/08/2019 reveals that she used CPAP 29 of the past 30 days for compliance of 97%.  She used CPAP greater than 4 hours 29 of the past 30 days for compliance of 97%.  Average usage was 8 hours and 43 minutes.  Residual AHI was 0.3 on 6 to 12 cm of water and an EPR of 3.  There was no significant leak noted.  HISTORY: (copied from my note on 06/15/2019)  Debra [name] is a 45 y.o. female here today for follow up for OSA on CPAP. Sleep study revealed mild OSA with total AHI of 5.5/hr but strong REM dependant OSA with AHI of 24/hr. She was started on auto PAP therapy at 6-12cmH20 pressure and medium Dreamwear FFM. She is doing very well with CPAP therapy.  She has adjusted well and noted significant improvement in sleep  quality and increased energy.  She does report that he had humidity is, at times, too hot and causes her to have hot flash.  She has not tried adjusting the settings on her CPAP machine at home.   Compliance report dated 05/15/2019 through 06/13/2019 reveals that she used CPAP 30 out of the last 30 days for compliance of 100%.  26 of the last 30 days she used CPAP greater than 4 hours for compliance of 87%.  Average usage was 6 hours and 50 minutes.  Residual AHI was 0.2 on 6 to 12 cm of water and an EPR of 3.  There was no significant leak noted.       HISTORY: (copied from Dr Edwena Felty note on 12/10/2018)   Debra [name] is a 45 y.o. year old 8 or [place] American female patient seen here upon referral by Venancio Poisson on 12/10/2018     Chief concern according to patient : " My sleeping patten has changed with my recent stroke- and started in March 19th 2019 ""  My doctors d/c my hormone replacement therapy  after my strokewhich has helped me to sleep'-      I have the pleasure of seeing Debra [name] today, a right-handed [place] American female with a possible sleep disorder.  She has had a stroke in the left MCA - Dr. Erlinda Hong.   has a past medical history of Allergic rhinitis, cause unspecified, Arthritis, Asthma, Diabetes mellitus type II (08/2009 dx), Endometriosis, Hyperlipemia, Hypertension, Knee pain, right, Lactose intolerance, Morbid obesity ([place]), Osteoarthritis, Ovarian cyst, and Stroke ([place]). she is in surgical menopause , status post total hysterectomy ( 2015). The patient never had a sleep study.    History Summary Debra [name] is a 45 y.o. female with history of HTN, type 2 DM, morbid obesity, endometriosis/ovarian cyst s/p hysterectomy/SBO and seasonal allergies admitted on 06/30/17 for slurred speech and expressive aphasia.  CT head showed small cortical hypoattenuation within the left parietal occipital junction possible acute infarct. CTA head and neck on admission  multiple anterior and posterior stenosis, and MRI showed a cluster of acute infarct posterior left temporal lobe and temporoparietal junction region in the left MCA territory.  After admission, patient had fluctuating left MCA syndrome especially with positional changes and BP changes.  Repeat CT showed worsening stable left medial occipital lobe infarct.  Repeat CT head and neck showed unchanged bilateral high-grade M2 stenosis.  CT perfusion with small perfusion deficit, not amendable to intervention.  Repeat MRI showed patchy acute infarct in the left posterior MCA and watershed distributions.  Underwent cerebral angiogram on 07/04/2017, found to have left MCA inferior division >75% stenosis and right M1 80% stenosis, no vasculitis but atherosclerosis.  At end of procedure patient had a vasovagal event while holding pressure at the right groin.  BP dropped and she again had left MCA syndrome.  She was given IV bolus, put into Trendelenburg position and treated with pressors.  BP improved and symptoms resolved.  She was admitted in ICU after procedure, continues to have mild expressive aphasia but motor function at baseline.  LDL 129, and A1c 8.5.  UDS negative.  Hypercoagulable work-up negative.  She received left MCA stenting on 07/07/2017.  Her symptoms improved and was discharged home on 07/08/2017 with home health PT/speech.  Home medication including aspirin, Brilinta and Lipitor 80.       Sleep relevant medical history:  Tonsillectomy; none , cervical spine surgery no ENT surgery or trauma. She is morbidly obese.      Family medical /sleep history: No other family member on CPAP with OSA. father had a CVA in his 63s and remained aphasic. HTN, DM .    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   and lives in a household with 2 persons- she and her mother . Family status is single. Pets are not  present. Tobacco use: none .   ETOH use ; none . Caffeine intake in form of Coffee (none) Soda(2 cans a day ) Tea ( rarely) , no energy drinks. Regular exercise in form of walking  Hobbies :  Shooting.      Sleep habits are as follows:  The patient's dinner time is between 6-7 PM. The patient goes to bed at 11-12 PM and continues to have trouble to initiate sleep for several hours, most nights. If asleep, stays asleep for hours. She is easily woken by sounds She may wake for one bathroom break - but cannot go back to sleep after. Her bedroom is not cool, quiet and dark- her mother needs the home warmer. She sweats a lot.  The preferred sleep position is lateral , with the support of 2 pillows.  Dreams are reportedly frequent and vivid. Not nightmarish.    10.30 AM is the usual rise time. The patient wakes up spontaneously.  She reports not feeling refreshed or restored in AM, rarely having symptoms such as dry mouth or morning headaches but residual fatigue.  Naps are not taken.     REVIEW OF SYSTEMS: Out of a complete 14 system review of symptoms, the patient complains only of the following symptoms, none and all other reviewed systems are negative.  ESS: 2  ALLERGIES: Allergies  Allergen Reactions   Nutritional Supplements Anaphylaxis and Rash    NUT allergy   Dust Mite Extract    Metformin And Related Diarrhea   Morphine Hives   Augmentin [Amoxicillin-Pot Clavulanate] Rash    To bilat post hands only   Influenza Vaccines Itching and Rash    HOME MEDICATIONS: Outpatient Medications Prior to Visit  Medication Sig Dispense Refill   albuterol (PROAIR HFA) 108 (90 BASE) MCG/ACT inhaler Inhale 2 puffs into the lungs every 4 (four) hours as needed for wheezing or shortness of breath. 18 g 2   albuterol (PROVENTIL) (2.5 MG/3ML) 0.083% nebulizer solution Take 2.5 mg by nebulization every 6 (six) hours as needed for wheezing or shortness of breath.     blood glucose meter kit and supplies Dispense based on patient  and insurance preference. Use up to four times daily as directed. (FOR ICD-9 250.00, 250.01). 1 each 0   co-enzyme Q-10 30 MG capsule Take 30 mg by mouth 3 (three) times daily.     Continuous Blood Gluc Receiver (FREESTYLE LIBRE 14 DAY READER) DEVI 1 Act by Does not apply route daily. 2 each 5   Continuous Blood Gluc Sensor (FREESTYLE LIBRE 14 DAY SENSOR) MISC 1 Act by Does not apply route daily. 2 each 5   EPINEPHrine 0.3 mg/0.3 mL IJ SOAJ injection Inject 0.3 mLs (0.3 mg total) into the muscle as needed for anaphylaxis. 1 each 1   fexofenadine (ALLEGRA) 180 MG tablet Take 180 mg by mouth daily.      fluticasone (FLONASE) 50 MCG/ACT nasal spray Place 2 sprays into both nostrils daily. (Patient taking differently: Place 2 sprays into both nostrils daily as needed for allergies.) 16 g 6   glipiZIDE (GLUCOTROL XL) 5 MG 24 hr tablet Take 1 tablet (5 mg total) by mouth daily with breakfast. 90 tablet 1   glucose blood test strip Used to check blood sugars up to four times daily as needed 200 each 3   Ibuprofen-Famotidine 800-26.6 MG TABS Take 1 tablet by mouth 3 (three) times daily as needed. 90 tablet 6   Lancets MISC Used to check blood sugars up to four times daily as needed 200 each 3   losartan-hydrochlorothiazide (HYZAAR) 100-12.5 MG tablet Take 1 tablet by mouth daily. 90 tablet 3   Menthol, Topical Analgesic, (BIOFREEZE EX) Apply 1 application topically daily as needed (pain).     montelukast (SINGULAIR) 10 MG tablet Take 1 tablet (10 mg total) by mouth at bedtime. 30 tablet 3   Multiple Vitamin (MULTIVITAMIN) tablet Take 1 tablet by mouth daily.     olopatadine (PATANOL) 0.1 % ophthalmic solution Place 1 drop into both eyes 2 (two) times daily as needed for allergies.      rosuvastatin (CRESTOR) 40 MG tablet Take 1 tablet (40 mg total) by mouth daily. 90 tablet 3   senna (SENOKOT) 8.6 MG TABS tablet Take 1 tablet by mouth daily.     sitaGLIPtin (JANUVIA) 100 MG  tablet Take 1 tablet (100 mg  total) by mouth daily. 90 tablet 1   sulfamethoxazole-trimethoprim (BACTRIM DS) 800-160 MG tablet Take 1 tablet by mouth 2 (two) times daily. 10 tablet 0   ticagrelor (BRILINTA) 90 MG TABS tablet Take 0.5 tablets (45 mg total) by mouth 2 (two) times daily. 30 tablet 11   tirzepatide (MOUNJARO) 2.5 MG/0.5ML Pen Inject 2.5 mg into the skin once a week.     No facility-administered medications prior to visit.    PAST MEDICAL HISTORY: Past Medical History:  Diagnosis Date   Allergic rhinitis, cause unspecified    Arthritis    Asthma    Diabetes mellitus type II 08/2009 dx   Endometriosis    Hyperlipemia    Hypertension    Knee pain, right    DJD, post traumatic with repeat falls   Lactose intolerance    Morbid obesity ([place])    s/p lab band 09/2010   Osteoarthritis    Ovarian cyst    right s/p resection June 2013   Stroke [facility])     PAST SURGICAL HISTORY: Past Surgical History:  Procedure Laterality Date   ABDOMINAL HYSTERECTOMY  05/14/12   [place] [facility]   IR ANGIO INTRA EXTRACRAN SEL COM CAROTID INNOMINATE BILAT MOD SED  07/04/2017   IR ANGIO INTRA EXTRACRAN SEL COM CAROTID INNOMINATE UNI R MOD SED  08/26/2019   IR ANGIO VERTEBRAL SEL VERTEBRAL BILAT MOD SED  07/04/2017   IR ANGIO VERTEBRAL SEL VERTEBRAL BILAT MOD SED  08/26/2019   IR INTRA CRAN STENT  07/07/2017   IR RADIOLOGIST EVAL & MGMT  08/13/2017   IR US GUIDE VASC ACCESS RIGHT  08/26/2019   LAPAROSCOPIC GASTRIC BANDING  10/02/2010   start weight 349#   RADIOLOGY WITH ANESTHESIA N/A 07/07/2017   Procedure: STENTING;  Surgeon: Luanne Bras, MD;  Location: [facility];  Service: Radiology;  Laterality: N/A;   SALPINGOOPHORECTOMY  10/08/11   right    FAMILY HISTORY: Family History  Problem Relation Age of Onset   Heart disease Mother    Hypertension Mother    Diabetes Mother    Stroke Father 77   Diabetes Father    Hypertension Father    Hyperlipidemia Father    Heart disease Father    Kidney disease Father     Cancer Father    Liver disease Father    Obesity Father     SOCIAL HISTORY: Social History   Socioeconomic History   Marital status: Single    Spouse name: n/a   Number of children: 0   Years of education: 12+   Highest education level: Not on file  Occupational History   Occupation: Retired  Tobacco Use   Smoking status: Never   Smokeless tobacco: Never  Vaping Use   Vaping Use: Never used  Substance and Sexual Activity   Alcohol use: No    Alcohol/week: 0.0 standard drinks   Drug use: No   Sexual activity: Not on file  Other Topics Concern   Not on file  Social History Narrative   Single, lives with mom.    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   Completed some college.   Social Determinants of Health   Financial Resource Strain: Not on file  Food Insecurity: Not on file  Transportation Needs: Not on file  Physical Activity: Not on file  Stress: Not on file  Social Connections: Not  on file  Intimate Partner Violence: Not on file      PHYSICAL EXAM  Vitals:   12/12/20 1309  BP: 121/76  Pulse: 71  Weight: (!) 356 lb 3.2 oz (161.6 kg)  Height: '5\' 8"'  (1.727 m)    Body mass index is 54.16 kg/m.  Generalized: Well developed, in no acute distress  Cardiology: normal rate and rhythm, no murmur noted Respiratory: clear to auscultation bilaterally  Neurological examination  Mentation: Alert oriented to time, place, history taking. Follows all commands speech and language fluent Cranial nerve II-XII: Pupils were equal round reactive to light. Extraocular movements were full, visual field were full  Motor: The motor testing reveals 5 over 5 strength of all 4 extremities. Good symmetric motor tone is noted throughout.  Gait and station: Gait is normal.   DIAGNOSTIC DATA (LABS, IMAGING, TESTING) - I reviewed patient records, labs, notes, testing and imaging myself where  available.  No flowsheet data found.   Lab Results  Component Value Date   WBC 8.0 07/10/2020   HGB 12.9 07/10/2020   HCT 39.9 07/10/2020   MCV 81.0 07/10/2020   PLT 315.0 07/10/2020      Component Value Date/Time   NA 138 07/10/2020 1355   NA 142 07/01/2018 1412   K 3.6 07/10/2020 1355   CL 101 07/10/2020 1355   CO2 29 07/10/2020 1355   GLUCOSE 224 (H) 07/10/2020 1355   BUN 15 07/10/2020 1355   BUN 10 07/01/2018 1412   CREATININE 0.90 07/10/2020 1355   CREATININE 0.99 12/15/2019 1056   CALCIUM 9.2 07/10/2020 1355   PROT 6.8 07/10/2020 1355   PROT 6.7 07/01/2018 1412   ALBUMIN 4.0 07/10/2020 1355   ALBUMIN 4.2 07/01/2018 1412   AST 10 07/10/2020 1355   ALT 11 07/10/2020 1355   ALKPHOS 80 07/10/2020 1355   BILITOT 0.3 07/10/2020 1355   BILITOT 0.3 07/01/2018 1412   GFRNONAA 69 12/15/2019 1056   GFRAA 80 12/15/2019 1056   Lab Results  Component Value Date   CHOL 138 07/10/2020   HDL 51.00 07/10/2020   LDLCALC 59 07/10/2020   TRIG 138.0 07/10/2020   CHOLHDL 3 07/10/2020   Lab Results  Component Value Date   HGBA1C 10.2 (A) 07/10/2020   Lab Results  Component Value Date   VITAMINB12 512 07/01/2018   Lab Results  Component Value Date   TSH 1.73 12/15/2019       ASSESSMENT AND PLAN 45 y.o. year old female  has a past medical history of Allergic rhinitis, cause unspecified, Arthritis, Asthma, Diabetes mellitus type II (08/2009 dx), Endometriosis, Hyperlipemia, Hypertension, Knee pain, right, Lactose intolerance, Morbid obesity ([place]), Osteoarthritis, Ovarian cyst, and Stroke ([place]). here with     ICD-10-CM   1. OSA (obstructive sleep apnea)  G47.33 For home use only DME continuous positive airway pressure (CPAP)       Dynasia is doing very well with CPAP therapy.  Compliance report reveals excellent compliance.  She was encouraged to continue using CPAP nightly and for greater than 4 hours each night.  She will communicate with [facility] regarding supply  needs. Healthy lifestyle habits encouraged.  She will continue to follow-up with primary care and endocrinology as directed for management of comorbidities and stroke prevention. She was advised to call Dr Estanislado Pandy to discuss need for repeat imaging. CT ordered in 03/2020 but not completed by patient. She will follow up with [name] in 1 year, sooner if needed.  She verbalizes understanding and agreement with  this plan.   Orders Placed This Encounter  Procedures   For home use only DME continuous positive airway pressure (CPAP)    Supplies    Order Specific Question:   Length of Need    Answer:   Lifetime    Order Specific Question:   Patient has OSA or probable OSA    Answer:   Yes    Order Specific Question:   Is the patient currently using CPAP in the home    Answer:   Yes    Order Specific Question:   Settings    Answer:   Other see comments    Order Specific Question:   CPAP supplies needed    Answer:   Mask, headgear, cushions, filters, heated tubing and water chamber      No orders of the defined types were placed in this encounter.      Debbora Presto, FNP-C 12/12/2020, 1:27 PM [facility] [address], [place], [place] [address] [phone]

## 2020-12-12 ENCOUNTER — Encounter: Payer: Self-pay | Admitting: Family Medicine

## 2020-12-12 ENCOUNTER — Ambulatory Visit: Payer: BC Managed Care – PPO | Admitting: Family Medicine

## 2020-12-12 ENCOUNTER — Other Ambulatory Visit: Payer: Self-pay

## 2020-12-12 VITALS — BP 121/76 | HR 71 | Ht 68.0 in | Wt 356.2 lb

## 2020-12-12 DIAGNOSIS — G4733 Obstructive sleep apnea (adult) (pediatric): Secondary | ICD-10-CM

## 2021-01-24 DIAGNOSIS — H524 Presbyopia: Secondary | ICD-10-CM | POA: Diagnosis not present

## 2021-01-24 DIAGNOSIS — H2513 Age-related nuclear cataract, bilateral: Secondary | ICD-10-CM | POA: Diagnosis not present

## 2021-01-24 DIAGNOSIS — E119 Type 2 diabetes mellitus without complications: Secondary | ICD-10-CM | POA: Diagnosis not present

## 2021-01-24 DIAGNOSIS — H35033 Hypertensive retinopathy, bilateral: Secondary | ICD-10-CM | POA: Diagnosis not present

## 2021-02-27 ENCOUNTER — Telehealth (INDEPENDENT_AMBULATORY_CARE_PROVIDER_SITE_OTHER): Payer: BC Managed Care – PPO | Admitting: Family Medicine

## 2021-02-27 ENCOUNTER — Encounter: Payer: Self-pay | Admitting: Family Medicine

## 2021-02-27 VITALS — Temp 98.6°F

## 2021-02-27 DIAGNOSIS — R059 Cough, unspecified: Secondary | ICD-10-CM

## 2021-02-27 DIAGNOSIS — R0981 Nasal congestion: Secondary | ICD-10-CM

## 2021-02-27 MED ORDER — BENZONATATE 100 MG PO CAPS: ORAL_CAPSULE | ORAL | 0 refills | Status: DC

## 2021-02-27 NOTE — Progress Notes (Signed)
Virtual Visit via Video Note  I connected with Debra [name]  on 02/27/21 at 12:00 PM EST by a video enabled telemedicine application and verified that I am speaking with the correct person using two identifiers.  Location patient: home, [place] Location provider:work or home office Persons participating in the virtual visit: patient, provider  I discussed the limitations of evaluation and management by telemedicine and the availability of in person appointments. The patient expressed understanding and agreed to proceed.   HPI:  Acute telemedicine visit for Cough: -Onset: 5 days ago - after being exposed to dust from her fan -negative covid test at home -Symptoms include: nasal congestion, cough, sneezing, pnd, itcy eyes -Denies:fevers, CP, SOB, malaise, body aches, NVD -Has tried: flonase, allegra -Pertinent past medical history: see below -Pertinent medication allergies:  Allergies  Allergen Reactions   Nutritional Supplements Anaphylaxis and Rash    NUT allergy   Dust Mite Extract    Metformin And Related Diarrhea   Morphine Hives   Augmentin [Amoxicillin-Pot Clavulanate] Rash    To bilat post hands only   Influenza Vaccines Itching and Rash  -COVID-19 vaccine status:  Immunization History  Administered Date(s) Administered   Pneumococcal Conjugate-13 04/18/2015   Pneumococcal Polysaccharide-23 05/30/2014   Td 04/22/2004   Tdap 05/30/2014    ROS: See pertinent positives and negatives per HPI.  Past Medical History:  Diagnosis Date   Allergic rhinitis, cause unspecified    Arthritis    Asthma    Diabetes mellitus type II 08/2009 dx   Endometriosis    Hyperlipemia    Hypertension    Knee pain, right    DJD, post traumatic with repeat falls   Lactose intolerance    Morbid obesity ([place])    s/p lab band 09/2010   Osteoarthritis    Ovarian cyst    right s/p resection June 2013   Stroke [facility])     Past Surgical History:  Procedure Laterality Date   ABDOMINAL HYSTERECTOMY   05/14/12   [place] [facility]   IR ANGIO INTRA EXTRACRAN SEL COM CAROTID INNOMINATE BILAT MOD SED  07/04/2017   IR ANGIO INTRA EXTRACRAN SEL COM CAROTID INNOMINATE UNI R MOD SED  08/26/2019   IR ANGIO VERTEBRAL SEL VERTEBRAL BILAT MOD SED  07/04/2017   IR ANGIO VERTEBRAL SEL VERTEBRAL BILAT MOD SED  08/26/2019   IR INTRA CRAN STENT  07/07/2017   IR RADIOLOGIST EVAL & MGMT  08/13/2017   IR US GUIDE VASC ACCESS RIGHT  08/26/2019   LAPAROSCOPIC GASTRIC BANDING  10/02/2010   start weight 349#   RADIOLOGY WITH ANESTHESIA N/A 07/07/2017   Procedure: STENTING;  Surgeon: Luanne Bras, MD;  Location: [facility];  Service: Radiology;  Laterality: N/A;   SALPINGOOPHORECTOMY  10/08/11   right     Current Outpatient Medications:    albuterol (PROAIR HFA) 108 (90 BASE) MCG/ACT inhaler, Inhale 2 puffs into the lungs every 4 (four) hours as needed for wheezing or shortness of breath., Disp: 18 g, Rfl: 2   albuterol (PROVENTIL) (2.5 MG/3ML) 0.083% nebulizer solution, Take 2.5 mg by nebulization every 6 (six) hours as needed for wheezing or shortness of breath., Disp: , Rfl:    benzonatate (TESSALON PERLES) 100 MG capsule, 1-2 capsules up to twice daily, Disp: 40 capsule, Rfl: 0   blood glucose meter kit and supplies, Dispense based on patient and insurance preference. Use up to four times daily as directed. (FOR ICD-9 250.00, 250.01)., Disp: 1 each, Rfl: 0   co-enzyme Q-10  30 MG capsule, Take 30 mg by mouth 3 (three) times daily., Disp: , Rfl:    EPINEPHrine 0.3 mg/0.3 mL IJ SOAJ injection, Inject 0.3 mLs (0.3 mg total) into the muscle as needed for anaphylaxis., Disp: 1 each, Rfl: 1   fexofenadine (ALLEGRA) 180 MG tablet, Take 180 mg by mouth daily. , Disp: , Rfl:    fluticasone (FLONASE) 50 MCG/ACT nasal spray, Place 2 sprays into both nostrils daily. (Patient taking differently: Place 2 sprays into both nostrils daily as needed for allergies.), Disp: 16 g, Rfl: 6   glipiZIDE (GLUCOTROL XL) 5 MG 24 hr tablet,  Take 1 tablet (5 mg total) by mouth daily with breakfast., Disp: 90 tablet, Rfl: 1   glucose blood test strip, Used to check blood sugars up to four times daily as needed, Disp: 200 each, Rfl: 3   ibuprofen (ADVIL) 800 MG tablet, Take 800 mg by mouth as needed., Disp: , Rfl:    Lancets MISC, Used to check blood sugars up to four times daily as needed, Disp: 200 each, Rfl: 3   losartan-hydrochlorothiazide (HYZAAR) 100-12.5 MG tablet, Take 1 tablet by mouth daily., Disp: 90 tablet, Rfl: 3   Menthol, Topical Analgesic, (BIOFREEZE EX), Apply 1 application topically daily as needed (pain)., Disp: , Rfl:    montelukast (SINGULAIR) 10 MG tablet, Take 1 tablet (10 mg total) by mouth at bedtime. (Patient taking differently: Take 10 mg by mouth at bedtime as needed.), Disp: 30 tablet, Rfl: 3   Multiple Vitamin (MULTIVITAMIN) tablet, Take 1 tablet by mouth daily., Disp: , Rfl:    olopatadine (PATANOL) 0.1 % ophthalmic solution, Place 1 drop into both eyes 2 (two) times daily as needed for allergies. , Disp: , Rfl:    rosuvastatin (CRESTOR) 40 MG tablet, Take 1 tablet (40 mg total) by mouth daily., Disp: 90 tablet, Rfl: 3   senna (SENOKOT) 8.6 MG TABS tablet, Take 1 tablet by mouth daily., Disp: , Rfl:    ticagrelor (BRILINTA) 90 MG TABS tablet, Take 0.5 tablets (45 mg total) by mouth 2 (two) times daily., Disp: 30 tablet, Rfl: 11   tirzepatide (MOUNJARO) 2.5 MG/0.5ML Pen, Inject 2.5 mg into the skin once a week., Disp: , Rfl:   EXAM:  VITALS per patient if applicable:  GENERAL: alert, oriented, appears well and in no acute distress  HEENT: atraumatic, conjunttiva clear, no obvious abnormalities on inspection of external nose and ears  NECK: normal movements of the head and neck  LUNGS: on inspection no signs of respiratory distress, breathing rate appears normal, no obvious gross SOB, gasping or wheezing  CV: no obvious cyanosis  MS: moves all visible extremities without noticeable  abnormality  PSYCH/NEURO: pleasant and cooperative, no obvious depression or anxiety, speech and thought processing grossly intact  ASSESSMENT AND PLAN:  Discussed the following assessment and plan:  Nasal congestion  Cough, unspecified type  -we discussed possible serious and likely etiologies, options for evaluation and workup, limitations of telemedicine visit vs in person visit, treatment, treatment risks and precautions. Pt is agreeable to treatment via telemedicine at this moment. Query VURI, Allergic rhinitis vs other. She opted for tessalon for cough, change in antihistamine, continuation of flonase, short course nasal decongestant and per pt insturctions.  Advised to seek prompt follow up with PCP office or [place] virtual visits or in person care if worsening, new symptoms arise, or if is not improving with treatment. Discussed options for inperson care if PCP office not available. Did let this patient  know that I only do telemedicine on Tuesdays and Thursdays for [place]. Advised to schedule follow up visit with PCP or [facility] if any further questions or concerns to avoid delays in care.   I discussed the assessment and treatment plan with the patient. The patient was provided an opportunity to ask questions and all were answered. The patient agreed with the plan and demonstrated an understanding of the instructions.     Lucretia Kern, DO

## 2021-02-27 NOTE — Patient Instructions (Addendum)
-  I sent the medication(s) we discussed to your pharmacy: Meds ordered this encounter  Medications   benzonatate (TESSALON PERLES) 100 MG capsule    Sig: 1-2 capsules up to twice daily    Dispense:  40 capsule    Refill:  0   -try changing from Allegra to Zyrtec for 1-2 weeks  -nasal saline twice daily  -short course of Afrin for 3 days  -stay hydrated with plenty of water and void dairy and sugar while sick  I hope you are feeling better soon!  Seek follow up video visit or in person care promptly if your symptoms worsen, new concerns arise or you are not improving with treatment.  It was nice to meet you today. I help [facility] out with telemedicine visits on Tuesdays and Thursdays and am available for visits on those days. If you have any concerns or questions following this visit please schedule a follow up visit with your Primary Care doctor or seek care at a local urgent care clinic to avoid delays in care.

## 2021-03-19 ENCOUNTER — Telehealth: Payer: Self-pay | Admitting: Internal Medicine

## 2021-03-19 NOTE — Telephone Encounter (Signed)
Patient calling in  Says she got call from [employer] on behalf of her Ticagrelor (BRILINTA) 90 MG TABS tablet letting her know that it has expired  Says the faxed [name] over a new rx request on her behalf today 03/19/21.[name] advised patient fax currently not received but we would look out for it  Please let patient know when fax has reached [name]

## 2021-03-19 NOTE — Telephone Encounter (Signed)
Noted  

## 2021-03-20 NOTE — Telephone Encounter (Signed)
Patient calling in  Advised patient we received fax from [employer] & form has been placed in provider box.[name] also advised patient of 7-10 BD turnaround time  Patient would like for provider to send short supply rx Ticagrelor (BRILINTA) 90 MG TABS tablet to local pharmacy for pickup bc she is completely out of medication   [facility] [id number] [place], [place] - [address]  Phone:  [phone] Fax:  [phone]

## 2021-03-20 NOTE — Telephone Encounter (Signed)
Ok to send in.  

## 2021-03-21 MED ORDER — TICAGRELOR 90 MG PO TABS
45.0000 mg | ORAL_TABLET | Freq: Two times a day (BID) | ORAL | 0 refills | Status: DC
Start: 2021-03-21 — End: 2021-04-03

## 2021-03-21 NOTE — Addendum Note (Signed)
Addended by: Manuela Schwartz on: 03/21/2021 09:39 AM   Modules accepted: Orders

## 2021-03-21 NOTE — Telephone Encounter (Signed)
Refill sent in

## 2021-03-27 ENCOUNTER — Encounter: Payer: Self-pay | Admitting: Internal Medicine

## 2021-03-28 NOTE — Telephone Encounter (Signed)
Se below

## 2021-03-28 NOTE — Telephone Encounter (Signed)
Patient calling to check response status of mychart message left.  Patient has an ov scheduled 12-13  Patient states she would still like provider to respond on mychart w/opinion

## 2021-04-03 ENCOUNTER — Encounter: Payer: Self-pay | Admitting: Internal Medicine

## 2021-04-03 ENCOUNTER — Other Ambulatory Visit: Payer: Self-pay

## 2021-04-03 ENCOUNTER — Ambulatory Visit (INDEPENDENT_AMBULATORY_CARE_PROVIDER_SITE_OTHER): Payer: Self-pay | Admitting: Internal Medicine

## 2021-04-03 VITALS — BP 124/78 | HR 64 | Resp 18 | Ht 68.0 in | Wt 340.6 lb

## 2021-04-03 DIAGNOSIS — E118 Type 2 diabetes mellitus with unspecified complications: Secondary | ICD-10-CM

## 2021-04-03 DIAGNOSIS — J4521 Mild intermittent asthma with (acute) exacerbation: Secondary | ICD-10-CM

## 2021-04-03 MED ORDER — BENZONATATE 200 MG PO CAPS: 200.0000 mg | ORAL_CAPSULE | Freq: Three times a day (TID) | ORAL | 2 refills | Status: DC | PRN

## 2021-04-03 MED ORDER — PREDNISONE 20 MG PO TABS: 40.0000 mg | ORAL_TABLET | Freq: Every day | ORAL | 0 refills | Status: DC

## 2021-04-03 MED ORDER — TICAGRELOR 90 MG PO TABS
45.0000 mg | ORAL_TABLET | Freq: Two times a day (BID) | ORAL | 0 refills | Status: DC
Start: 2021-04-03 — End: 2021-04-17

## 2021-04-03 MED ORDER — METHYLPREDNISOLONE ACETATE 40 MG/ML IJ SUSP
40.0000 mg | Freq: Once | INTRAMUSCULAR | Status: AC
  Administered 2021-04-03: 40 mg via INTRAMUSCULAR

## 2021-04-03 NOTE — Progress Notes (Signed)
° °  Subjective:   Patient ID: Debra [name], female    DOB: [birth year], 45 y.o.   MRN: [id number]  HPI The patient is a 45 YO female coming in for asthma flare. Tried otc for several weeks already and worsening.   Review of Systems  Constitutional:  Positive for activity change, appetite change and chills. Negative for fatigue, fever and unexpected weight change.  HENT:  Positive for congestion, postnasal drip, rhinorrhea and sinus pressure. Negative for ear discharge, ear pain, sinus pain, sneezing, sore throat, tinnitus, trouble swallowing and voice change.   Eyes: Negative.   Respiratory:  Positive for cough and wheezing. Negative for chest tightness and shortness of breath.   Cardiovascular: Negative.   Gastrointestinal: Negative.   Musculoskeletal:  Positive for myalgias.  Neurological: Negative.    Objective:  Physical Exam Constitutional:      Appearance: She is well-developed.  HENT:     Head: Normocephalic and atraumatic.     Comments: Oropharynx with redness and clear drainage, nose with swollen turbinates, TMs normal bilaterally.  Neck:     Thyroid: No thyromegaly.  Cardiovascular:     Rate and Rhythm: Normal rate and regular rhythm.  Pulmonary:     Effort: Pulmonary effort is normal. No respiratory distress.     Breath sounds: Wheezing present. No rales.     Comments: Minimal wheezing Abdominal:     Palpations: Abdomen is soft.  Musculoskeletal:        General: Tenderness present.     Cervical back: Normal range of motion.  Lymphadenopathy:     Cervical: No cervical adenopathy.  Skin:    General: Skin is warm and dry.  Neurological:     Mental Status: She is alert and oriented to person, place, and time.    Vitals:   04/03/21 1405  BP: 124/78  Pulse: 64  Resp: 18  SpO2: 98%  Weight: (!) 340 lb 9.6 oz (154.5 kg)  Height: 5\' 8"  (1.727 m)    This visit occurred during the SARS-CoV-2 public health emergency.  Safety protocols were in place, including  screening questions prior to the visit, additional usage of staff PPE, and extensive cleaning of exam room while observing appropriate contact time as indicated for disinfecting solutions.   Assessment & Plan:  Depo-medrol 40 mg IM given a visit

## 2021-04-03 NOTE — Telephone Encounter (Signed)
Patient calling in  Says she needs rx for ticagrelor (BRILINTA) 90 MG TABS tablet sent to [facility] where she can get it at $0 oop bc [facility] is wanting to charge her around $500 OOP  Please let patient know when rx has been sent [phone]  [facility] [phone]

## 2021-04-03 NOTE — Patient Instructions (Addendum)
We have done the steroid shot today and sent in prednisone to take 2 pills daily for 5 days.

## 2021-04-03 NOTE — Addendum Note (Signed)
Addended by: Manuela Schwartz on: 04/03/2021 01:15 PM   Modules accepted: Orders

## 2021-04-03 NOTE — Telephone Encounter (Signed)
Refill has been sent to the new requested pharmacy

## 2021-04-06 NOTE — Assessment & Plan Note (Signed)
With flare today. Given depo-medrol 40 mg IM and prednisone short course. She does have uncontrolled diabetes and advised that this will likely increase her sugar levels.

## 2021-04-06 NOTE — Assessment & Plan Note (Signed)
Poorly controlled and we are unable to avoid steroids today due to asthma flare. She is aware that this will likely increase her sugar levels.

## 2021-04-17 ENCOUNTER — Other Ambulatory Visit: Payer: Self-pay

## 2021-04-17 DIAGNOSIS — I6603 Occlusion and stenosis of bilateral middle cerebral arteries: Secondary | ICD-10-CM

## 2021-04-17 MED ORDER — TICAGRELOR 90 MG PO TABS: 45.0000 mg | ORAL_TABLET | Freq: Two times a day (BID) | ORAL | 0 refills | Status: DC

## 2021-04-24 ENCOUNTER — Other Ambulatory Visit: Payer: Self-pay | Admitting: Internal Medicine

## 2021-06-21 ENCOUNTER — Other Ambulatory Visit: Payer: Self-pay

## 2021-06-21 ENCOUNTER — Ambulatory Visit (INDEPENDENT_AMBULATORY_CARE_PROVIDER_SITE_OTHER): Payer: Self-pay | Admitting: Internal Medicine

## 2021-06-21 ENCOUNTER — Encounter: Payer: Self-pay | Admitting: Internal Medicine

## 2021-06-21 DIAGNOSIS — N611 Abscess of the breast and nipple: Secondary | ICD-10-CM

## 2021-06-21 MED ORDER — FLUCONAZOLE 150 MG PO TABS: 150.0000 mg | ORAL_TABLET | ORAL | 0 refills | Status: DC

## 2021-06-21 MED ORDER — SULFAMETHOXAZOLE-TRIMETHOPRIM 800-160 MG PO TABS: 1.0000 | ORAL_TABLET | Freq: Two times a day (BID) | ORAL | 0 refills | Status: DC

## 2021-06-21 NOTE — Patient Instructions (Signed)
We have sent in bactrim to take 1 pill twice a day for 5 days. We have also sent in the diflucan to use if needed. ?

## 2021-06-21 NOTE — Assessment & Plan Note (Signed)
Rx bactrim 5 day and diflucan to cover for likely yeast infection. We discussed she needs to continue to work on diabetes with her endocrinologist with Hga1c 9.7 this puts her at risk for recurrent infections. ?

## 2021-06-21 NOTE — Progress Notes (Signed)
? ?  Subjective:  ? ?Patient ID: Debra [name], female    DOB: 1976-03-23, 46 y.o.   MRN: [id number] ? ?HPI ?The patient is a 46 YO female coming in for recurrent abscess breast. Most recent HgA1c 9.7 ? ?Review of Systems  ?Constitutional: Negative.   ?HENT: Negative.    ?Eyes: Negative.   ?Respiratory:  Negative for cough, chest tightness and shortness of breath.   ?Cardiovascular:  Negative for chest pain, palpitations and leg swelling.  ?Gastrointestinal:  Negative for abdominal distention, abdominal pain, constipation, diarrhea, nausea and vomiting.  ?Musculoskeletal: Negative.   ?Skin:  Positive for wound.  ?Neurological: Negative.   ?Psychiatric/Behavioral: Negative.    ? ?Objective:  ?Physical Exam ?Constitutional:   ?   Appearance: She is well-developed. She is obese.  ?HENT:  ?   Head: Normocephalic and atraumatic.  ?Cardiovascular:  ?   Rate and Rhythm: Normal rate and regular rhythm.  ?Pulmonary:  ?   Effort: Pulmonary effort is normal. No respiratory distress.  ?   Breath sounds: Normal breath sounds. No wheezing or rales.  ?   Comments: Midline chest between breasts abscess with mixed firm and fluctuance about 1-2 cm oval ?Abdominal:  ?   General: Bowel sounds are normal. There is no distension.  ?   Palpations: Abdomen is soft.  ?   Tenderness: There is no abdominal tenderness. There is no rebound.  ?Musculoskeletal:  ?   Cervical back: Normal range of motion.  ?Skin: ?   General: Skin is warm and dry.  ?Neurological:  ?   Mental Status: She is alert and oriented to person, place, and time.  ?   Coordination: Coordination normal.  ? ? ?Vitals:  ? 06/21/21 1110  ?BP: 128/62  ?Pulse: 62  ?Resp: 18  ?SpO2: 100%  ?Weight: (!) 337 lb 9.6 oz (153.1 kg)  ?Height: 5\' 8"  (1.727 m)  ? ? ?This visit occurred during the SARS-CoV-2 public health emergency.  Safety protocols were in place, including screening questions prior to the visit, additional usage of staff PPE, and extensive cleaning of exam room while  observing appropriate contact time as indicated for disinfecting solutions.  ? ?Assessment & Plan:  ? ?

## 2021-06-25 ENCOUNTER — Ambulatory Visit: Payer: Medicaid Other | Admitting: Internal Medicine

## 2021-07-16 ENCOUNTER — Other Ambulatory Visit: Payer: Self-pay | Admitting: Internal Medicine

## 2021-07-16 ENCOUNTER — Telehealth: Payer: Self-pay | Admitting: Internal Medicine

## 2021-07-16 DIAGNOSIS — I6603 Occlusion and stenosis of bilateral middle cerebral arteries: Secondary | ICD-10-CM

## 2021-07-16 NOTE — Telephone Encounter (Signed)
Refill has been sent to the pt's pharmacy  

## 2021-07-16 NOTE — Telephone Encounter (Signed)
1.Medication Requested: BRILINTA 90 MG TABS tablet ? ?2. Pharmacy (Name, Street, [facility]): [facility] [id number] - [place], [place] - [address] ? ?3. On Med List: Y ? ?4. Last Visit with PCP: 06-21-2021 ? ?5. Next visit date with PCP: n/a ? ? ?Agent: Please be advised that RX refills may take up to 3 business days. We ask that you follow-up with your pharmacy.  ?

## 2021-07-17 ENCOUNTER — Telehealth: Payer: Self-pay | Admitting: Internal Medicine

## 2021-07-17 NOTE — Telephone Encounter (Signed)
1.Medication Requested: ?Lancets MISC ?2. Pharmacy (Name, [address], [place]): ?[facility] [id number] - [place], [place] Phone:  [phone]  ?Fax:  [phone]  ?  ? ?3. On Med List: y ? ?4. Last Visit with PCP: 3.2.2023 ? ?5. Next visit date with PCP: ? ? ?Agent: Please be advised that RX refills may take up to 3 business days. We ask that you follow-up with your pharmacy.  ?

## 2021-07-18 MED ORDER — LANCETS MISC: 3 refills | Status: DC

## 2021-07-18 NOTE — Telephone Encounter (Signed)
Refill was sent to the pt's pharmacy  

## 2021-07-20 NOTE — Telephone Encounter (Signed)
Pt states the wrong lancets was sent in ? ?Pt requesting the one touch ultra lancets ? ?Pharmacy [facility] [id number] - [place], [place] - [address] ?

## 2021-07-23 MED ORDER — ONETOUCH ULTRASOFT LANCETS MISC: 12 refills | Status: DC

## 2021-07-23 NOTE — Telephone Encounter (Signed)
Correct Lancets have been sent to the pt's pharmacy. ?

## 2021-07-23 NOTE — Addendum Note (Signed)
Addended by: Manuela Schwartz on: 07/23/2021 10:03 AM ? ? Modules accepted: Orders ? ?

## 2021-07-25 MED ORDER — ONETOUCH ULTRA 2 W/DEVICE KIT: 1.0000 "application " | PACK | Freq: Every day | 0 refills | Status: DC

## 2021-07-25 MED ORDER — ONETOUCH DELICA PLUS LANCET30G MISC: 1.0000 "application " | Freq: Every day | 11 refills | Status: DC

## 2021-07-25 NOTE — Telephone Encounter (Addendum)
One touch ultra 2 lancets is whats needed.  ? ?Pt states that the last ones are too big. ? ?Please advise ?

## 2021-07-25 NOTE — Telephone Encounter (Signed)
Pharmacy states pt was not sure which lancets was needed ? ?Pharmacy states the correct lancets to send is ? ?One touch delica plus lancets ? ?Pharmacy [facility] [id number] - [place], [place] - [address] ?

## 2021-07-25 NOTE — Telephone Encounter (Signed)
Correct lancets have been sent to the pt's pharmacy  ?

## 2021-07-25 NOTE — Addendum Note (Signed)
Addended by: Thomes Cake on: 07/25/2021 10:14 AM ? ? Modules accepted: Orders ? ?

## 2021-07-25 NOTE — Addendum Note (Signed)
Addended by: Manuela Schwartz on: 07/25/2021 02:24 PM ? ? Modules accepted: Orders ? ?

## 2021-07-31 ENCOUNTER — Other Ambulatory Visit: Payer: Self-pay | Admitting: Internal Medicine

## 2021-07-31 DIAGNOSIS — E1165 Type 2 diabetes mellitus with hyperglycemia: Secondary | ICD-10-CM

## 2021-08-03 ENCOUNTER — Telehealth: Payer: Self-pay

## 2021-08-03 NOTE — Telephone Encounter (Signed)
Pt is requesting a new Rx for  ?Continuous Blood Gluc Sensor (FREESTYLE LIBRE 14 DAY SENSOR)  ? ?The sensor is on the D/C med list ? ?Pharmacy: ?[facility] [id number] - [place], [place] ? ?LOV  07/16/21 ?

## 2021-08-06 MED ORDER — CONTINUOUS BLOOD GLUC SENSOR MISC: 1.0000 | 0 refills | Status: DC

## 2021-08-06 NOTE — Telephone Encounter (Signed)
Ok to refill 

## 2021-08-06 NOTE — Telephone Encounter (Signed)
Meter has been sent to the pts pharmacy ?

## 2021-08-14 ENCOUNTER — Other Ambulatory Visit: Payer: Self-pay | Admitting: Internal Medicine

## 2021-08-14 DIAGNOSIS — I6603 Occlusion and stenosis of bilateral middle cerebral arteries: Secondary | ICD-10-CM

## 2021-08-21 ENCOUNTER — Ambulatory Visit (INDEPENDENT_AMBULATORY_CARE_PROVIDER_SITE_OTHER): Payer: 59 | Admitting: Internal Medicine

## 2021-08-21 ENCOUNTER — Encounter: Payer: Self-pay | Admitting: Internal Medicine

## 2021-08-21 VITALS — BP 122/80 | HR 62 | Resp 18 | Ht 68.0 in | Wt 337.4 lb

## 2021-08-21 DIAGNOSIS — Z6841 Body Mass Index (BMI) 40.0 and over, adult: Secondary | ICD-10-CM | POA: Diagnosis not present

## 2021-08-21 DIAGNOSIS — R42 Dizziness and giddiness: Secondary | ICD-10-CM

## 2021-08-21 DIAGNOSIS — E118 Type 2 diabetes mellitus with unspecified complications: Secondary | ICD-10-CM | POA: Diagnosis not present

## 2021-08-21 DIAGNOSIS — J4520 Mild intermittent asthma, uncomplicated: Secondary | ICD-10-CM | POA: Diagnosis not present

## 2021-08-21 DIAGNOSIS — Z1211 Encounter for screening for malignant neoplasm of colon: Secondary | ICD-10-CM

## 2021-08-21 DIAGNOSIS — I10 Essential (primary) hypertension: Secondary | ICD-10-CM

## 2021-08-21 DIAGNOSIS — G4733 Obstructive sleep apnea (adult) (pediatric): Secondary | ICD-10-CM

## 2021-08-21 DIAGNOSIS — Z8673 Personal history of transient ischemic attack (TIA), and cerebral infarction without residual deficits: Secondary | ICD-10-CM

## 2021-08-21 DIAGNOSIS — Z0000 Encounter for general adult medical examination without abnormal findings: Secondary | ICD-10-CM | POA: Diagnosis not present

## 2021-08-21 DIAGNOSIS — T7840XA Allergy, unspecified, initial encounter: Secondary | ICD-10-CM

## 2021-08-21 DIAGNOSIS — E1169 Type 2 diabetes mellitus with other specified complication: Secondary | ICD-10-CM

## 2021-08-21 DIAGNOSIS — E785 Hyperlipidemia, unspecified: Secondary | ICD-10-CM

## 2021-08-21 LAB — COMPREHENSIVE METABOLIC PANEL
ALT: 17 U/L (ref 0–35)
AST: 11 U/L (ref 0–37)
Albumin: 3.6 g/dL (ref 3.5–5.2)
Alkaline Phosphatase: 90 U/L (ref 39–117)
BUN: 14 mg/dL (ref 6–23)
CO2: 29 mEq/L (ref 19–32)
Calcium: 8.6 mg/dL (ref 8.4–10.5)
Chloride: 103 mEq/L (ref 96–112)
Creatinine, Ser: 1.25 mg/dL — ABNORMAL HIGH (ref 0.40–1.20)
GFR: 51.4 mL/min — ABNORMAL LOW (ref >60.00)
Glucose, Bld: 248 mg/dL — ABNORMAL HIGH (ref 70–99)
Potassium: 3.7 mEq/L (ref 3.5–5.1)
Sodium: 139 mEq/L (ref 135–145)
Total Bilirubin: 0.3 mg/dL (ref 0.2–1.2)
Total Protein: 6.4 g/dL (ref 6.0–8.3)

## 2021-08-21 LAB — MICROALBUMIN / CREATININE URINE RATIO
Creatinine,U: 217.5 mg/dL
Microalb Creat Ratio: 0.3 mg/g (ref 0.0–30.0)
Microalb, Ur: 0.7 mg/dL (ref 0.0–1.9)

## 2021-08-21 LAB — CBC
HCT: 39.2 % (ref 36.0–46.0)
Hemoglobin: 12.6 g/dL (ref 12.0–15.0)
MCHC: 32.3 g/dL (ref 30.0–36.0)
MCV: 81.6 fl (ref 78.0–100.0)
Platelets: 280 10*3/uL (ref 150.0–400.0)
RBC: 4.8 Mil/uL (ref 3.87–5.11)
RDW: 13.8 % (ref 11.5–15.5)
WBC: 8.5 10*3/uL (ref 4.0–10.5)

## 2021-08-21 LAB — LIPID PANEL
Cholesterol: 168 mg/dL (ref 0–200)
HDL: 46.3 mg/dL (ref >39.00)
LDL Cholesterol: 96 mg/dL (ref 0–99)
NonHDL: 121.49
Total CHOL/HDL Ratio: 4
Triglycerides: 127 mg/dL (ref 0.0–149.0)
VLDL: 25.4 mg/dL (ref 0.0–40.0)

## 2021-08-21 LAB — VITAMIN D 25 HYDROXY (VIT D DEFICIENCY, FRACTURES): VITD: 19.87 ng/mL — ABNORMAL LOW (ref 30.00–100.00)

## 2021-08-21 LAB — VITAMIN B12: Vitamin B-12: 405 pg/mL (ref 211–911)

## 2021-08-21 LAB — HEMOGLOBIN A1C: Hgb A1c MFr Bld: 8.8 % — ABNORMAL HIGH (ref 4.6–6.5)

## 2021-08-21 LAB — TSH: TSH: 2.1 u[IU]/mL (ref 0.35–5.50)

## 2021-08-21 MED ORDER — METHYLPREDNISOLONE ACETATE 40 MG/ML IJ SUSP
40.0000 mg | Freq: Once | INTRAMUSCULAR | Status: AC
  Administered 2021-08-21: 40 mg via INTRAMUSCULAR

## 2021-08-21 MED ORDER — LOSARTAN POTASSIUM 25 MG PO TABS
25.0000 mg | ORAL_TABLET | Freq: Every day | ORAL | 3 refills | Status: DC
Start: 2021-08-21 — End: 2021-10-30

## 2021-08-21 NOTE — Patient Instructions (Addendum)
We will check the labs.  ? ?We have given you the steroid shot today. ? ?We will get you in for a colonoscopy for colon cancer screening.  ?

## 2021-08-21 NOTE — Assessment & Plan Note (Signed)
Taking brilinta 45 mg BID and will refill as needed stay on lifelong. ?

## 2021-08-21 NOTE — Assessment & Plan Note (Signed)
Checking lipid panel and adjust crestor 40 mg daily as needed for goal LDL <70. ?

## 2021-08-21 NOTE — Assessment & Plan Note (Signed)
Mild increase in symptoms due to pollen. Taking singulair and allegra and using albuterol nebulizer or inhaler when needed.  ?

## 2021-08-21 NOTE — Assessment & Plan Note (Signed)
Using CPAP regularly

## 2021-08-21 NOTE — Assessment & Plan Note (Signed)
Weight is down 3 pounds since last visit and she is working to lose weight. Had some success with mounjaro but got some side effects from that.  ?

## 2021-08-21 NOTE — Progress Notes (Signed)
? ?  Subjective:  ? ?Patient ID: Debra [name], female    DOB: 07-18-1975, 46 y.o.   MRN: [id number] ? ?HPI ?The patient is a 46 YO female coming in for physical with some concerns as well. ? ?PMH, [facility], social history reviewed and updated ? ?Review of Systems  ?Constitutional: Negative.   ?HENT: Negative.    ?Eyes: Negative.   ?Respiratory:  Negative for cough, chest tightness and shortness of breath.   ?Cardiovascular:  Negative for chest pain, palpitations and leg swelling.  ?Gastrointestinal:  Negative for abdominal distention, abdominal pain, constipation, diarrhea, nausea and vomiting.  ?Musculoskeletal: Negative.   ?Skin: Negative.   ?     hives  ?Neurological: Negative.   ?Psychiatric/Behavioral: Negative.    ? ?Objective:  ?Physical Exam ?Constitutional:   ?   Appearance: She is well-developed. She is obese.  ?HENT:  ?   Head: Normocephalic and atraumatic.  ?Cardiovascular:  ?   Rate and Rhythm: Normal rate and regular rhythm.  ?Pulmonary:  ?   Effort: Pulmonary effort is normal. No respiratory distress.  ?   Breath sounds: Normal breath sounds. No wheezing or rales.  ?Abdominal:  ?   General: Bowel sounds are normal. There is no distension.  ?   Palpations: Abdomen is soft.  ?   Tenderness: There is no abdominal tenderness. There is no rebound.  ?Musculoskeletal:  ?   Cervical back: Normal range of motion.  ?Skin: ?   General: Skin is warm and dry.  ?   Comments: Foot exam done, hives on the neck left sided  ?Neurological:  ?   Mental Status: She is alert and oriented to person, place, and time.  ?   Coordination: Coordination normal.  ? ? ?Vitals:  ? 08/21/21 1028  ?BP: 122/80  ?Pulse: 62  ?Resp: 18  ?SpO2: 100%  ?Weight: (!) 337 lb 6.4 oz (153 kg)  ?Height: 5\' 8"  (1.727 m)  ? ? ?This visit occurred during the SARS-CoV-2 public health emergency.  Safety protocols were in place, including screening questions prior to the visit, additional usage of staff PPE, and extensive cleaning of exam room while observing  appropriate contact time as indicated for disinfecting solutions.  ? ?Assessment & Plan:  ?Depo-medrol 40 mg IM given at visit ?

## 2021-08-21 NOTE — Assessment & Plan Note (Signed)
Flu shot declines. Pneumonia declines. Tetanus up to date. Colonoscopy referral to GI. Mammogram up to date, pap smear up to date. Counseled about sun safety and mole surveillance. Counseled about the dangers of distracted driving. Given 10 year screening recommendations.  ? ?

## 2021-08-21 NOTE — Assessment & Plan Note (Signed)
Drank some orange juice while traveling which set off an allergic reaction. Overall moderate and still itching and hives no facial swelling or anaphylaxis. Given depo-medrol 40 mg IM today and encouraged to continue her allegra and benadryl as needed.  ?

## 2021-08-21 NOTE — Assessment & Plan Note (Addendum)
BP has been low at home and she did stop her losartan/hctz 100/12.5 mg daily. BP is borderline today and not at goal of <130/80. Resume losartan 25 mg daily new rx done. Checking CMP. ?

## 2021-08-21 NOTE — Assessment & Plan Note (Signed)
Foot exam done, checking microalbumin to creatinine ratio and HgA1c and lipid panel. She is taking mounjaro and amaryl through endocrinology. Adjust as needed. She is not taking ARB currently will resume at low dose due to BP borderline today. On statin. Reminded about eye exam.  ?

## 2021-08-24 ENCOUNTER — Other Ambulatory Visit: Payer: Self-pay | Admitting: Internal Medicine

## 2021-08-24 MED ORDER — VITAMIN D (ERGOCALCIFEROL) 1.25 MG (50000 UNIT) PO CAPS: 50000.0000 [IU] | ORAL_CAPSULE | ORAL | 0 refills | Status: DC

## 2021-08-27 ENCOUNTER — Telehealth: Payer: Self-pay | Admitting: Internal Medicine

## 2021-08-27 MED ORDER — VITAMIN D (ERGOCALCIFEROL) 1.25 MG (50000 UNIT) PO CAPS: 50000.0000 [IU] | ORAL_CAPSULE | ORAL | 0 refills | Status: DC

## 2021-08-27 NOTE — Telephone Encounter (Signed)
Patient said that her rx for Vitamin D has not been received at pharmacy.  Please send to :  [facility] ?

## 2021-08-27 NOTE — Telephone Encounter (Signed)
Okay to resend to different pharmacy ?

## 2021-08-27 NOTE — Telephone Encounter (Signed)
Refill has been sent to Debra [name] on [address] as requested. ?

## 2021-10-30 ENCOUNTER — Ambulatory Visit (INDEPENDENT_AMBULATORY_CARE_PROVIDER_SITE_OTHER): Payer: 59 | Admitting: Gastroenterology

## 2021-10-30 ENCOUNTER — Telehealth: Payer: Self-pay

## 2021-10-30 ENCOUNTER — Encounter: Payer: Self-pay | Admitting: Gastroenterology

## 2021-10-30 VITALS — BP 128/78 | HR 65 | Ht 68.0 in | Wt 341.5 lb

## 2021-10-30 DIAGNOSIS — Z1211 Encounter for screening for malignant neoplasm of colon: Secondary | ICD-10-CM | POA: Diagnosis not present

## 2021-10-30 DIAGNOSIS — Z1212 Encounter for screening for malignant neoplasm of rectum: Secondary | ICD-10-CM | POA: Diagnosis not present

## 2021-10-30 MED ORDER — NA SULFATE-K SULFATE-MG SULF 17.5-3.13-1.6 GM/177ML PO SOLN: 1.0000 | Freq: Once | ORAL | 0 refills | Status: AC

## 2021-10-30 NOTE — Telephone Encounter (Signed)
Good morning Debra Lomax NP,   This patient is having an Colonoscopy with Dr Lyndel Safe on 01-17-2022 and he is requesting this patient be off her Brilinta for 7 days prior to the procedure. Please advise if patient is able to be off this medication prior to her procedure as soon as possible.   [facility] Pre-operative Risk Assessment     Request for surgical clearance:     Endoscopy Procedure  What type of surgery is being performed?     Colonoscopy  When is this surgery scheduled?     01-17-2022  What type of clearance is required ?   Pharmacy  Are there any medications that need to be held prior to surgery and how long? Brilinta 7 days prior to procedure  Practice name and name of physician performing surgery?      [facility] Jackquline Denmark MD  What is your office phone and fax number?      Phone- [phone]  [phone]  Anesthesia type (None, local, MAC, general) ?       MAC

## 2021-10-30 NOTE — Telephone Encounter (Signed)
Good morning Dr Sharlet Salina,  This patient is having an Colonoscopy with Dr Lyndel Safe on 01-17-2022 and he is requesting this patient be off her Brilinta for 7 days prior to the procedure. Please advise if patient is able to be off this medication prior to her procedure as soon as possible.  [facility] Pre-operative Risk Assessment     Request for surgical clearance:     Endoscopy Procedure  What type of surgery is being performed?     Colonoscopy  When is this surgery scheduled?     01-17-2022  What type of clearance is required ?   Pharmacy  Are there any medications that need to be held prior to surgery and how long? Brilinta 7 days prior to procedure  Practice name and name of physician performing surgery?      [facility] Jackquline Denmark MD  What is your office phone and fax number?      Phone- [phone]  [phone]  Anesthesia type (None, local, MAC, general) ?       MAC

## 2021-10-30 NOTE — Progress Notes (Signed)
Chief Complaint: for CRC screening  Referring Provider:  Hoyt Koch, *      ASSESSMENT AND PLAN;   #1. CRC screening   Plan: -Colon at [facility] off brilinta x 7 days after neuro cleraence (BMI>50)   Discussed risks & benefits of colonoscopy. Risks including rare perforation req laparotomy, bleeding after bx/polypectomy req blood transfusion, rarely missing neoplasms, risks of anesthesia/sedation, rare risk of damage to internal organs. Benefits outweigh the risks. Patient agrees to proceed. All the questions were answered. Pt consents to proceed.  HIGHER THAN BASELINE RISK.The nature of the procedure, as well as the risks, benefits, and alternatives were carefully and thoroughly reviewed with the patient. Ample time for discussion and questions allowed. The patient understood, was satisfied, and agreed to proceed.  HPI:    Debra [name] is a 46 y.o. female  With morbid obesity (BMI>50, despite lap band), HTN, CVA on Brilinta, HLD, DM2, asthma  For screening colon.  No nausea, vomiting, heartburn, regurgitation, odynophagia or dysphagia.  No significant diarrhea or constipation (better with senna 1/day and increased water intake).  No melena or hematochezia. No unintentional weight loss. No abdominal pain.  On brilinta since 2019 d/t CVA.     Past Medical History:  Diagnosis Date   Allergic rhinitis, cause unspecified    Arthritis    Asthma    Diabetes mellitus type II 08/2009 dx   Endometriosis    Hyperlipemia    Hypertension    Knee pain, right    DJD, post traumatic with repeat falls   Lactose intolerance    Morbid obesity ([place])    s/p lab band 09/2010   Osteoarthritis    Ovarian cyst    right s/p resection June 2013   Stroke [facility]) 2019    Past Surgical History:  Procedure Laterality Date   ABDOMINAL HYSTERECTOMY  05/14/12   [place]   IR ANGIO INTRA EXTRACRAN SEL COM CAROTID INNOMINATE BILAT MOD SED  07/04/2017   IR ANGIO INTRA EXTRACRAN  SEL COM CAROTID INNOMINATE UNI R MOD SED  08/26/2019   IR ANGIO VERTEBRAL SEL VERTEBRAL BILAT MOD SED  07/04/2017   IR ANGIO VERTEBRAL SEL VERTEBRAL BILAT MOD SED  08/26/2019   IR INTRA CRAN STENT  07/07/2017   IR RADIOLOGIST EVAL & MGMT  08/13/2017   IR US GUIDE VASC ACCESS RIGHT  08/26/2019   LAPAROSCOPIC GASTRIC BANDING  10/02/2010   start weight 349#   RADIOLOGY WITH ANESTHESIA N/A 07/07/2017   Procedure: STENTING;  Surgeon: Luanne Bras, MD;  Location: [place];  Service: Radiology;  Laterality: N/A;   SALPINGOOPHORECTOMY  10/08/11   right    Family History  Problem Relation Age of Onset   Heart disease Mother    Hypertension Mother    Diabetes Mother    Stroke Father 56   Diabetes Father    Hypertension Father    Hyperlipidemia Father    Heart disease Father    Kidney disease Father    Cancer Father    Liver disease Father    Obesity Father    Colon cancer Neg Hx    Stomach cancer Neg Hx    Esophageal cancer Neg Hx    Rectal cancer Neg Hx     Social History   Tobacco Use   Smoking status: Never   Smokeless tobacco: Never  Vaping Use   Vaping Use: Never used  Substance Use Topics   Alcohol use: No    Alcohol/week: 0.0  standard drinks of alcohol   Drug use: No    Current Outpatient Medications  Medication Sig Dispense Refill   albuterol (PROAIR HFA) 108 (90 BASE) MCG/ACT inhaler Inhale 2 puffs into the lungs every 4 (four) hours as needed for wheezing or shortness of breath. 18 g 2   albuterol (PROVENTIL) (2.5 MG/3ML) 0.083% nebulizer solution Take 2.5 mg by nebulization every 6 (six) hours as needed for wheezing or shortness of breath.     blood glucose meter kit and supplies Dispense based on patient and insurance preference. Use up to four times daily as directed. (FOR ICD-9 250.00, 250.01). 1 each 0   Blood Glucose Monitoring Suppl (ONE TOUCH ULTRA 2) w/Device KIT 1 application. by Does not apply route daily at 4 PM. 1 kit 0   Continuous Blood Gluc Sensor MISC 1  each by Does not apply route as directed. Use as directed every 14 days. May dispense FreeStyle [employer] or similar. 1 each 0   EPINEPHrine 0.3 mg/0.3 mL IJ SOAJ injection Inject 0.3 mLs (0.3 mg total) into the muscle as needed for anaphylaxis. 1 each 1   fexofenadine (ALLEGRA) 180 MG tablet Take 180 mg by mouth daily.      fluticasone (FLONASE) 50 MCG/ACT nasal spray Place 2 sprays into both nostrils daily. (Patient taking differently: Place 2 sprays into both nostrils daily as needed for allergies.) 16 g 6   glimepiride (AMARYL) 4 MG tablet Take 4 mg by mouth daily.     glucose blood test strip Used to check blood sugars up to four times daily as needed 200 each 3   ibuprofen (ADVIL) 800 MG tablet Take 800 mg by mouth as needed.     Lancets (ONETOUCH DELICA PLUS [id number]) MISC 1 application. by Does not apply route daily. 100 each 11   montelukast (SINGULAIR) 10 MG tablet Take 1 tablet (10 mg total) by mouth at bedtime. (Patient taking differently: Take 10 mg by mouth at bedtime as needed.) 30 tablet 3   Multiple Vitamin (MULTIVITAMIN) tablet Take 1 tablet by mouth daily.     olopatadine (PATANOL) 0.1 % ophthalmic solution Place 1 drop into both eyes 2 (two) times daily as needed for allergies.      senna (SENOKOT) 8.6 MG TABS tablet Take 1 tablet by mouth daily.     sitaGLIPtin (JANUVIA) 100 MG tablet 1 tablet     ticagrelor (BRILINTA) 90 MG TABS tablet TAKE 1/2 TABLET BY MOUTH TWICE A DAY 30 tablet 5   Vitamin D, Ergocalciferol, (DRISDOL) 1.25 MG (50000 UNIT) CAPS capsule Take 1 capsule (50,000 Units total) by mouth every 7 (seven) days. 12 capsule 0   No current facility-administered medications for this visit.    Allergies  Allergen Reactions   Nutritional Supplements Anaphylaxis and Rash    NUT allergy   Dust Mite Extract    Metformin And Related Diarrhea   Morphine Hives   Augmentin [Amoxicillin-Pot Clavulanate] Rash    To bilat post hands only   Influenza Vaccines  Itching and Rash    Review of Systems:  Constitutional: Denies fever, chills, diaphoresis, appetite change and fatigue.  HEENT: Denies photophobia, eye pain, redness, hearing loss, ear pain, congestion, sore throat, rhinorrhea, sneezing, mouth sores, neck pain, neck stiffness and tinnitus.   Respiratory: Denies SOB, DOE, cough, chest tightness,  and wheezing.   Cardiovascular: Denies chest pain, palpitations and leg swelling.  Genitourinary: Denies dysuria, urgency, frequency, hematuria, flank pain and difficulty urinating.  Musculoskeletal: Denies  myalgias, back pain, joint swelling, arthralgias and gait problem.  Skin: No rash.  Neurological: Denies dizziness, seizures, syncope, weakness, light-headedness, numbness and headaches.  Hematological: Denies adenopathy. Easy bruising, personal or family bleeding history  Psychiatric/Behavioral: No anxiety or depression     Physical Exam:    BP 128/78   Pulse 65   Ht '5\' 8"'  (1.727 m)   Wt (!) 341 lb 8 oz (154.9 kg)   LMP 07/28/2011   SpO2 99%   BMI 51.92 kg/m  Wt Readings from Last 3 Encounters:  10/30/21 (!) 341 lb 8 oz (154.9 kg)  08/21/21 (!) 337 lb 6.4 oz (153 kg)  06/21/21 (!) 337 lb 9.6 oz (153.1 kg)   Constitutional:  Well-developed, in no acute distress. Psychiatric: Normal mood and affect. Behavior is normal. HEENT: Pupils normal.  Conjunctivae are normal. No scleral icterus. Cardiovascular: Normal rate, regular rhythm. No edema Pulmonary/chest: Effort normal and breath sounds normal. No wheezing, rales or rhonchi. Abdominal: Soft, nondistended. Nontender. Bowel sounds active throughout. There are no masses palpable. No hepatomegaly. Rectal: Deferred Neurological: Alert and oriented to person place and time. Skin: Skin is warm and dry. No rashes noted.  Data Reviewed: I have personally reviewed following labs and imaging studies  CBC:    Latest Ref Rng & Units 08/21/2021   11:03 AM 07/10/2020    1:55 PM 11/30/2019     1:26 AM  CBC  WBC 4.0 - 10.5 K/uL 8.5  8.0  7.5   Hemoglobin 12.0 - 15.0 g/dL 12.6  12.9  13.2   Hematocrit 36.0 - 46.0 % 39.2  39.9  43.0   Platelets 150.0 - 400.0 K/uL 280.0  315.0  257     CMP:    Latest Ref Rng & Units 08/21/2021   11:03 AM 07/10/2020    1:55 PM 12/15/2019   10:56 AM  CMP  Glucose 70 - 99 mg/dL 248  224  379   BUN 6 - 23 mg/dL '14  15  11   ' Creatinine 0.40 - 1.20 mg/dL 1.25  0.90  0.99   Sodium 135 - 145 mEq/L 139  138  135   Potassium 3.5 - 5.1 mEq/L 3.7  3.6  4.2   Chloride 96 - 112 mEq/L 103  101  96   CO2 19 - 32 mEq/L '29  29  30   ' Calcium 8.4 - 10.5 mg/dL 8.6  9.2  9.3   Total Protein 6.0 - 8.3 g/dL 6.4  6.8    Total Bilirubin 0.2 - 1.2 mg/dL 0.3  0.3    Alkaline Phos 39 - 117 U/L 90  80    AST 0 - 37 U/L 11  10    ALT 0 - 35 U/L 17  11       Carmell Austria, MD 10/30/2021, 10:39 AM  Cc: Hoyt Koch, *

## 2021-10-30 NOTE — Patient Instructions (Addendum)
If you are age 47 or older, your body mass index should be between 23-30. Your Body mass index is 51.92 kg/m. If this is out of the aforementioned range listed, please consider follow up with your Primary Care Provider.  If you are age 17 or younger, your body mass index should be between 19-25. Your Body mass index is 51.92 kg/m. If this is out of the aformentioned range listed, please consider follow up with your Primary Care Provider.   ________________________________________________________  The [facility] providers would like to encourage you to use [facility] to communicate with providers for non-urgent requests or questions.  Due to long hold times on the telephone, sending your provider a message by [facility] may be a faster and more efficient way to get a response.  Please allow 48 business hours for a response.  Please remember that this is for non-urgent requests.  _______________________________________________________  Bonita Quin have been scheduled for a colonoscopy. Please follow written instructions given to you at your visit today.  Please pick up your prep supplies at the pharmacy within the next 1-3 days. If you use inhalers (even only as needed), please bring them with you on the day of your procedure.  We have sent the following medications to your pharmacy for you to pick up at your convenience: Suprep  You will be contacted by our office prior to your procedure for directions on holding your Plavix.  If you do not hear from our office 3 week prior to your scheduled procedure, please call [phone] to discuss.  Please call with any questions or concerns.   Thank you,  Dr. Lynann Bologna

## 2021-11-01 NOTE — Telephone Encounter (Signed)
Okay to be off brilinta for 7 days

## 2021-11-01 NOTE — Telephone Encounter (Signed)
LVM for pt to call back.

## 2021-11-01 NOTE — Telephone Encounter (Signed)
Patient made aware and voiced understanding.

## 2021-11-01 NOTE — Telephone Encounter (Addendum)
Patient made aware and voiced understanding.

## 2021-11-15 ENCOUNTER — Other Ambulatory Visit: Payer: Self-pay | Admitting: Internal Medicine

## 2021-11-28 ENCOUNTER — Encounter: Payer: Self-pay | Admitting: Internal Medicine

## 2021-11-28 ENCOUNTER — Encounter (INDEPENDENT_AMBULATORY_CARE_PROVIDER_SITE_OTHER): Payer: Self-pay

## 2021-11-30 ENCOUNTER — Encounter: Payer: Self-pay | Admitting: Gastroenterology

## 2021-12-12 ENCOUNTER — Encounter: Payer: Self-pay | Admitting: Family Medicine

## 2021-12-12 ENCOUNTER — Ambulatory Visit: Payer: Commercial Managed Care - HMO | Admitting: Family Medicine

## 2021-12-12 VITALS — BP 162/97 | HR 64 | Ht 68.0 in | Wt 342.5 lb

## 2021-12-12 DIAGNOSIS — Z9989 Dependence on other enabling machines and devices: Secondary | ICD-10-CM | POA: Diagnosis not present

## 2021-12-12 DIAGNOSIS — G4733 Obstructive sleep apnea (adult) (pediatric): Secondary | ICD-10-CM | POA: Diagnosis not present

## 2021-12-12 NOTE — Progress Notes (Signed)
PATIENT: Debra [name] DOB: 09/23/1975  REASON FOR VISIT: follow up HISTORY FROM: patient  Chief Complaint  Patient presents with   Obstructive Sleep Apnea    Rm 2, alone. Here for yearly CPAP f/u. Pt reports doing well on CPAP. Pt wondering if she can ever come off CPAP. Would like to discuss coming off Brilinta.     HISTORY OF PRESENT ILLNESS:  12/12/21 ALL: Debra [name] returns for follow up for OSA on CPAP. She continues to do well on therapy. She is using her CPAP nightly for about 7-8 hours. She is doing well. She denies concerns with machine or supplies. She has been curious about need for therapy long term. She reports normal BP is 120-130/60-70. She continues Brilinta. She has not seen Dr Estanislado Pandy since 2021.     12/12/2020 ALL: Debra [name] returns for follow up for OSA on CPAP. She reports doing well with CPAP. She is using therapy every night. She denies concerns with machine. She feels DME sends supplies too often.   She is followed closely by PCP for stroke prevention. She continues rosuvastatin and Brillenta. BP is well managed. LDL 59 and HbA1C 10.2 in 06/2020. She was recently referred to endocrinology. She was last seen 2 weeks ago.     12/13/2019 ALL:  Debra [name] is a 46 y.o. female here today for follow up for OSA on CPAP.  Debra [name] is doing very well on CPAP therapy.  She continues to use her CPAP nightly.  She reports that she has had a change in insurance and has not been able to get new supplies.  We will assist with a prescription for new supplies today.  She is feeling well and without any concerns.  Compliance report dated 11/09/2019 through 12/08/2019 reveals that she used CPAP 29 of the past 30 days for compliance of 97%.  She used CPAP greater than 4 hours 29 of the past 30 days for compliance of 97%.  Average usage was 8 hours and 43 minutes.  Residual AHI was 0.3 on 6 to 12 cm of water and an EPR of 3.  There was no significant leak noted.  HISTORY:  (copied from my note on 06/15/2019)  Debra [name] is a 46 y.o. female here today for follow up for OSA on CPAP. Sleep study revealed mild OSA with total AHI of 5.5/hr but strong REM dependant OSA with AHI of 24/hr. She was started on auto PAP therapy at 6-12cmH20 pressure and medium Dreamwear FFM. She is doing very well with CPAP therapy.  She has adjusted well and noted significant improvement in sleep quality and increased energy.  She does report that he had humidity is, at times, too hot and causes her to have hot flash.  She has not tried adjusting the settings on her CPAP machine at home.   Compliance report dated 05/15/2019 through 06/13/2019 reveals that she used CPAP 30 out of the last 30 days for compliance of 100%.  26 of the last 30 days she used CPAP greater than 4 hours for compliance of 87%.  Average usage was 6 hours and 50 minutes.  Residual AHI was 0.2 on 6 to 12 cm of water and an EPR of 3.  There was no significant leak noted.     HISTORY: (copied from Dr Dohmeier's note on 12/10/2018)   Debra [name] is a 46 y.o. year old 88 or [place] American female patient seen here upon referral by Venancio Poisson on  12/10/2018     Chief concern according to patient : " My sleeping patten has changed with my recent stroke- and started in March 19th 2019 ""  My doctors d/c my hormone replacement therapy  after my strokewhich has helped me to sleep'-      I have the pleasure of seeing Debra [name] today, a right-handed [place] American female with a possible sleep disorder.  She has had a stroke in the left MCA - Dr. Erlinda Hong.   has a past medical history of Allergic rhinitis, cause unspecified, Arthritis, Asthma, Diabetes mellitus type II (08/2009 dx), Endometriosis, Hyperlipemia, Hypertension, Knee pain, right, Lactose intolerance, Morbid obesity ([place]), Osteoarthritis, Ovarian cyst, and Stroke ([place]). she is in surgical menopause , status post total hysterectomy ( 2015). The patient never had  a sleep study.    History Summary Debra [name] is a 46 y.o. female with history of HTN, type 2 DM, morbid obesity, endometriosis/ovarian cyst s/p hysterectomy/SBO and seasonal allergies admitted on 06/30/17 for slurred speech and expressive aphasia.  CT head showed small cortical hypoattenuation within the left parietal occipital junction possible acute infarct. CTA head and neck on admission multiple anterior and posterior stenosis, and MRI showed a cluster of acute infarct posterior left temporal lobe and temporoparietal junction region in the left MCA territory.  After admission, patient had fluctuating left MCA syndrome especially with positional changes and BP changes.  Repeat CT showed worsening stable left medial occipital lobe infarct.  Repeat CT head and neck showed unchanged bilateral high-grade M2 stenosis.  CT perfusion with small perfusion deficit, not amendable to intervention.  Repeat MRI showed patchy acute infarct in the left posterior MCA and watershed distributions.  Underwent cerebral angiogram on 07/04/2017, found to have left MCA inferior division >75% stenosis and right M1 80% stenosis, no vasculitis but atherosclerosis.  At end of procedure patient had a vasovagal event while holding pressure at the right groin.  BP dropped and she again had left MCA syndrome.  She was given IV bolus, put into Trendelenburg position and treated with pressors.  BP improved and symptoms resolved.  She was admitted in ICU after procedure, continues to have mild expressive aphasia but motor function at baseline.  LDL 129, and A1c 8.5.  UDS negative.  Hypercoagulable work-up negative.  She received left MCA stenting on 07/07/2017.  Her symptoms improved and was discharged home on 07/08/2017 with home health PT/speech.  Home medication including aspirin, Brilinta and Lipitor 80.   Sleep relevant medical history:  Tonsillectomy; none , cervical spine surgery no ENT surgery or trauma. She is morbidly obese.       Family medical /sleep history: No other family member on CPAP with OSA. father had a CVA in his 54s and remained aphasic. HTN, DM .    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   and lives in a household with 2 persons- she and her mother . Family status is single. Pets are not  present. Tobacco use: none .  ETOH use ; none . Caffeine intake in form of Coffee (none) Soda(2 cans a day ) Tea ( rarely) , no energy drinks. Regular exercise in form of walking  Hobbies :  Shooting.    Sleep habits are as follows:  The patient's dinner time is between 6-7 PM. The patient goes to bed at 11-12 PM and continues to have trouble to initiate  sleep for several hours, most nights. If asleep, stays asleep for hours. She is easily woken by sounds She may wake for one bathroom break - but cannot go back to sleep after. Her bedroom is not cool, quiet and dark- her mother needs the home warmer. She sweats a lot.  The preferred sleep position is lateral , with the support of 2 pillows. Dreams are reportedly frequent and vivid. Not nightmarish.    10.30 AM is the usual rise time. The patient wakes up spontaneously.  She reports not feeling refreshed or restored in AM, rarely having symptoms such as dry mouth or morning headaches but residual fatigue.  Naps are not taken.     REVIEW OF SYSTEMS: Out of a complete 14 system review of symptoms, the patient complains only of the following symptoms, none and all other reviewed systems are negative.  ESS: 3/24  ALLERGIES: Allergies  Allergen Reactions   Nutritional Supplements Anaphylaxis and Rash    NUT allergy   Dust Mite Extract    Metformin And Related Diarrhea   Morphine Hives   Augmentin [Amoxicillin-Pot Clavulanate] Rash    To bilat post hands only   Influenza Vaccines Itching and Rash    HOME MEDICATIONS: Outpatient Medications Prior to Visit  Medication Sig Dispense  Refill   albuterol (PROAIR HFA) 108 (90 BASE) MCG/ACT inhaler Inhale 2 puffs into the lungs every 4 (four) hours as needed for wheezing or shortness of breath. 18 g 2   albuterol (PROVENTIL) (2.5 MG/3ML) 0.083% nebulizer solution Take 2.5 mg by nebulization every 6 (six) hours as needed for wheezing or shortness of breath.     blood glucose meter kit and supplies Dispense based on patient and insurance preference. Use up to four times daily as directed. (FOR ICD-9 250.00, 250.01). 1 each 0   Blood Glucose Monitoring Suppl (ONE TOUCH ULTRA 2) w/Device KIT 1 application. by Does not apply route daily at 4 PM. 1 kit 0   Continuous Blood Gluc Sensor MISC 1 each by Does not apply route as directed. Use as directed every 14 days. May dispense FreeStyle [employer] or similar. 1 each 0   EPINEPHrine 0.3 mg/0.3 mL IJ SOAJ injection Inject 0.3 mLs (0.3 mg total) into the muscle as needed for anaphylaxis. 1 each 1   fexofenadine (ALLEGRA) 180 MG tablet Take 180 mg by mouth daily.      fluticasone (FLONASE) 50 MCG/ACT nasal spray Place 2 sprays into both nostrils daily. (Patient taking differently: Place 2 sprays into both nostrils daily as needed for allergies.) 16 g 6   glimepiride (AMARYL) 4 MG tablet Take 4 mg by mouth daily.     glucose blood test strip Used to check blood sugars up to four times daily as needed 200 each 3   ibuprofen (ADVIL) 800 MG tablet Take 800 mg by mouth as needed.     Lancets (ONETOUCH DELICA PLUS [id number]) MISC 1 application. by Does not apply route daily. 100 each 11   montelukast (SINGULAIR) 10 MG tablet Take 1 tablet (10 mg total) by mouth at bedtime. (Patient taking differently: Take 10 mg by mouth at bedtime as needed.) 30 tablet 3   Multiple Vitamin (MULTIVITAMIN) tablet Take 1 tablet by mouth daily.     olopatadine (PATANOL) 0.1 % ophthalmic solution Place 1 drop into both eyes 2 (two) times daily as needed for allergies.      senna (SENOKOT) 8.6 MG TABS tablet Take  1 tablet by mouth daily.  sitaGLIPtin (JANUVIA) 100 MG tablet 1 tablet     ticagrelor (BRILINTA) 90 MG TABS tablet TAKE 1/2 TABLET BY MOUTH TWICE A DAY 30 tablet 5   Vitamin D, Ergocalciferol, (DRISDOL) 1.25 MG (50000 UNIT) CAPS capsule Take 1 capsule (50,000 Units total) by mouth every 7 (seven) days. 12 capsule 0   No facility-administered medications prior to visit.    PAST MEDICAL HISTORY: Past Medical History:  Diagnosis Date   Allergic rhinitis, cause unspecified    Arthritis    Asthma    Diabetes mellitus type II 08/2009 dx   Endometriosis    Hyperlipemia    Hypertension    Knee pain, right    DJD, post traumatic with repeat falls   Lactose intolerance    Morbid obesity ([place])    s/p lab band 09/2010   Osteoarthritis    Ovarian cyst    right s/p resection June 2013   Stroke [facility]) 2019    PAST SURGICAL HISTORY: Past Surgical History:  Procedure Laterality Date   ABDOMINAL HYSTERECTOMY  05/14/12   [place] [facility]   IR ANGIO INTRA EXTRACRAN SEL COM CAROTID INNOMINATE BILAT MOD SED  07/04/2017   IR ANGIO INTRA EXTRACRAN SEL COM CAROTID INNOMINATE UNI R MOD SED  08/26/2019   IR ANGIO VERTEBRAL SEL VERTEBRAL BILAT MOD SED  07/04/2017   IR ANGIO VERTEBRAL SEL VERTEBRAL BILAT MOD SED  08/26/2019   IR INTRA CRAN STENT  07/07/2017   IR RADIOLOGIST EVAL & MGMT  08/13/2017   IR US GUIDE VASC ACCESS RIGHT  08/26/2019   LAPAROSCOPIC GASTRIC BANDING  10/02/2010   start weight 349#   RADIOLOGY WITH ANESTHESIA N/A 07/07/2017   Procedure: STENTING;  Surgeon: Luanne Bras, MD;  Location: [place];  Service: Radiology;  Laterality: N/A;   SALPINGOOPHORECTOMY  10/08/11   right    FAMILY HISTORY: Family History  Problem Relation Age of Onset   Heart disease Mother    Hypertension Mother    Diabetes Mother    Stroke Father 70   Diabetes Father    Hypertension Father    Hyperlipidemia Father    Heart disease Father    Kidney disease Father    Cancer Father    Liver disease  Father    Obesity Father    Colon cancer Neg Hx    Stomach cancer Neg Hx    Esophageal cancer Neg Hx    Rectal cancer Neg Hx     SOCIAL HISTORY: Social History   Socioeconomic History   Marital status: Single    Spouse name: n/a   Number of children: 0   Years of education: 12+   Highest education level: Not on file  Occupational History   Occupation: Retired  Tobacco Use   Smoking status: Never   Smokeless tobacco: Never  Vaping Use   Vaping Use: Never used  Substance and Sexual Activity   Alcohol use: No    Alcohol/week: 0.0 standard drinks of alcohol   Drug use: No   Sexual activity: Not on file  Other Topics Concern   Not on file  Social History Narrative   Single, lives with mom.    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   Completed some college.   Social Determinants of Health   Financial Resource Strain: Not on file  Food Insecurity: Not on file  Transportation Needs: Not on file  Physical Activity: Not on  file  Stress: Not on file  Social Connections: Not on file  Intimate Partner Violence: Not on file      PHYSICAL EXAM  Vitals:   12/12/21 1300  BP: (!) 162/97  Pulse: 64  Weight: (!) 342 lb 8 oz (155.4 kg)  Height: '5\' 8"'  (1.727 m)     Body mass index is 52.08 kg/m.  Generalized: Well developed, in no acute distress  Cardiology: normal rate and rhythm, no murmur noted Respiratory: clear to auscultation bilaterally  Neurological examination  Mentation: Alert oriented to time, place, history taking. Follows all commands speech and language fluent Cranial nerve II-XII: Pupils were equal round reactive to light. Extraocular movements were full, visual field were full  Motor: The motor testing reveals 5 over 5 strength of all 4 extremities. Good symmetric motor tone is noted throughout.  Gait and station: Gait is normal.   DIAGNOSTIC DATA (LABS, IMAGING,  TESTING) - I reviewed patient records, labs, notes, testing and imaging myself where available.      No data to display           Lab Results  Component Value Date   WBC 8.5 08/21/2021   HGB 12.6 08/21/2021   HCT 39.2 08/21/2021   MCV 81.6 08/21/2021   PLT 280.0 08/21/2021      Component Value Date/Time   NA 139 08/21/2021 1103   NA 142 07/01/2018 1412   K 3.7 08/21/2021 1103   CL 103 08/21/2021 1103   CO2 29 08/21/2021 1103   GLUCOSE 248 (H) 08/21/2021 1103   BUN 14 08/21/2021 1103   BUN 10 07/01/2018 1412   CREATININE 1.25 (H) 08/21/2021 1103   CREATININE 0.99 12/15/2019 1056   CALCIUM 8.6 08/21/2021 1103   PROT 6.4 08/21/2021 1103   PROT 6.7 07/01/2018 1412   ALBUMIN 3.6 08/21/2021 1103   ALBUMIN 4.2 07/01/2018 1412   AST 11 08/21/2021 1103   ALT 17 08/21/2021 1103   ALKPHOS 90 08/21/2021 1103   BILITOT 0.3 08/21/2021 1103   BILITOT 0.3 07/01/2018 1412   GFRNONAA 69 12/15/2019 1056   GFRAA 80 12/15/2019 1056   Lab Results  Component Value Date   CHOL 168 08/21/2021   HDL 46.30 08/21/2021   LDLCALC 96 08/21/2021   TRIG 127.0 08/21/2021   CHOLHDL 4 08/21/2021   Lab Results  Component Value Date   HGBA1C 8.8 (H) 08/21/2021   Lab Results  Component Value Date   VITAMINB12 405 08/21/2021   Lab Results  Component Value Date   TSH 2.10 08/21/2021     ASSESSMENT AND PLAN 46 y.o. year old female  has a past medical history of Allergic rhinitis, cause unspecified, Arthritis, Asthma, Diabetes mellitus type II (08/2009 dx), Endometriosis, Hyperlipemia, Hypertension, Knee pain, right, Lactose intolerance, Morbid obesity ([place]), Osteoarthritis, Ovarian cyst, and Stroke ([place]) (2019). here with     ICD-10-CM   1. OSA on CPAP  G47.33 For home use only DME continuous positive airway pressure (CPAP)   Z99.89       Debra [name] is doing very well with CPAP therapy.  Compliance report reveals excellent compliance.  She was encouraged to continue using CPAP nightly and  for greater than 4 hours each night.   Healthy lifestyle habits encouraged.  She will continue to follow-up with primary care and endocrinology as directed for management of comorbidities and stroke prevention. She was advised to call Dr Estanislado Pandy to discuss need for repeat imaging. CT ordered in 03/2020 but not completed by patient.  She will follow up with [name] in 1 year, sooner if needed.  She verbalizes understanding and agreement with this plan.   Orders Placed This Encounter  Procedures   For home use only DME continuous positive airway pressure (CPAP)    Supplies    Order Specific Question:   Length of Need    Answer:   Lifetime    Order Specific Question:   Patient has OSA or probable OSA    Answer:   Yes    Order Specific Question:   Is the patient currently using CPAP in the home    Answer:   Yes    Order Specific Question:   Settings    Answer:   Other see comments    Order Specific Question:   CPAP supplies needed    Answer:   Mask, headgear, cushions, filters, heated tubing and water chamber      No orders of the defined types were placed in this encounter.       Debbora Presto, FNP-C 12/12/2021, 1:30 PM [facility] [address] [place], [place] [address] [phone]

## 2021-12-12 NOTE — Patient Instructions (Addendum)
Please continue using your CPAP regularly. While your insurance requires that you use CPAP at least 4 hours each night on 70% of the nights, I recommend, that you not skip any nights and use it throughout the night if you can. Getting used to CPAP and staying with the treatment long term does take time and patience and discipline. Untreated obstructive sleep apnea when it is moderate to severe can have an adverse impact on cardiovascular health and raise her risk for heart disease, arrhythmias, hypertension, congestive heart failure, stroke and diabetes. Untreated obstructive sleep apnea causes sleep disruption, nonrestorative sleep, and sleep deprivation. This can have an impact on your day to day functioning and cause daytime sleepiness and impairment of cognitive function, memory loss, mood disturbance, and problems focussing. Using CPAP regularly can improve these symptoms.  Keep close eye on your BP. Call me with any significant weight loss and we can repeat sleep study.  Call Dr Corliss Skains to discuss need for follow up [phone], [phone]  We will see you next year!

## 2021-12-13 NOTE — Progress Notes (Signed)
CM sent for new order 

## 2022-01-10 ENCOUNTER — Encounter (HOSPITAL_COMMUNITY): Payer: Self-pay | Admitting: Gastroenterology

## 2022-01-17 ENCOUNTER — Ambulatory Visit (HOSPITAL_BASED_OUTPATIENT_CLINIC_OR_DEPARTMENT_OTHER): Payer: Commercial Managed Care - HMO | Admitting: Anesthesiology

## 2022-01-17 ENCOUNTER — Ambulatory Visit (HOSPITAL_COMMUNITY): Payer: Commercial Managed Care - HMO | Admitting: Anesthesiology

## 2022-01-17 ENCOUNTER — Other Ambulatory Visit: Payer: Self-pay

## 2022-01-17 ENCOUNTER — Encounter (HOSPITAL_COMMUNITY)
Admission: RE | Disposition: A | Discharge status: Home / Self Care | Payer: Self-pay | Source: Home / Self Care | Attending: Gastroenterology

## 2022-01-17 ENCOUNTER — Ambulatory Visit (HOSPITAL_COMMUNITY)
Admission: RE | Admit: 2022-01-17 | Discharge: 2022-01-17 | Disposition: A | Discharge status: Home / Self Care | Payer: Commercial Managed Care - HMO | Attending: Gastroenterology | Admitting: Gastroenterology

## 2022-01-17 DIAGNOSIS — K640 First degree hemorrhoids: Secondary | ICD-10-CM

## 2022-01-17 DIAGNOSIS — E119 Type 2 diabetes mellitus without complications: Secondary | ICD-10-CM | POA: Insufficient documentation

## 2022-01-17 DIAGNOSIS — G4730 Sleep apnea, unspecified: Secondary | ICD-10-CM | POA: Diagnosis not present

## 2022-01-17 DIAGNOSIS — Z8673 Personal history of transient ischemic attack (TIA), and cerebral infarction without residual deficits: Secondary | ICD-10-CM | POA: Insufficient documentation

## 2022-01-17 DIAGNOSIS — J45909 Unspecified asthma, uncomplicated: Secondary | ICD-10-CM | POA: Diagnosis not present

## 2022-01-17 DIAGNOSIS — Z1211 Encounter for screening for malignant neoplasm of colon: Secondary | ICD-10-CM | POA: Insufficient documentation

## 2022-01-17 DIAGNOSIS — I10 Essential (primary) hypertension: Secondary | ICD-10-CM

## 2022-01-17 DIAGNOSIS — Z6841 Body Mass Index (BMI) 40.0 and over, adult: Secondary | ICD-10-CM | POA: Insufficient documentation

## 2022-01-17 DIAGNOSIS — Z1212 Encounter for screening for malignant neoplasm of rectum: Secondary | ICD-10-CM

## 2022-01-17 DIAGNOSIS — Z7984 Long term (current) use of oral hypoglycemic drugs: Secondary | ICD-10-CM

## 2022-01-17 DIAGNOSIS — Z0001 Encounter for general adult medical examination with abnormal findings: Secondary | ICD-10-CM

## 2022-01-17 HISTORY — PX: COLONOSCOPY WITH PROPOFOL: SHX5780

## 2022-01-17 HISTORY — DX: Sleep apnea, unspecified: G47.30

## 2022-01-17 LAB — GLUCOSE, CAPILLARY: Glucose-Capillary: 238 mg/dL — ABNORMAL HIGH (ref 70–99)

## 2022-01-17 SURGERY — COLONOSCOPY WITH PROPOFOL
Anesthesia: Monitor Anesthesia Care

## 2022-01-17 MED ORDER — PROPOFOL 10 MG/ML IV BOLUS
INTRAVENOUS | Status: DC | PRN
  Administered 2022-01-17 (×2): 30 mg via INTRAVENOUS

## 2022-01-17 MED ORDER — LACTATED RINGERS IV SOLN: INTRAVENOUS | Status: DC

## 2022-01-17 MED ORDER — SODIUM CHLORIDE 0.9 % IV SOLN: INTRAVENOUS | Status: DC

## 2022-01-17 MED ORDER — PROPOFOL 500 MG/50ML IV EMUL
INTRAVENOUS | Status: DC | PRN
  Administered 2022-01-17: 125 ug/kg/min via INTRAVENOUS

## 2022-01-17 MED ORDER — PROPOFOL 500 MG/50ML IV EMUL
INTRAVENOUS | Status: AC
  Filled 2022-01-17: qty 50

## 2022-01-17 SURGICAL SUPPLY — 22 items

## 2022-01-17 NOTE — H&P (Signed)
Chief Complaint: for CRC screening   Referring Provider:  Hoyt Koch, *        ASSESSMENT AND PLAN;    #1. CRC screening     Plan: -Colon at [facility] off brilinta x 7 days after neuro cleraence (BMI>50)     Discussed risks & benefits of colonoscopy. Risks including rare perforation req laparotomy, bleeding after bx/polypectomy req blood transfusion, rarely missing neoplasms, risks of anesthesia/sedation, rare risk of damage to internal organs. Benefits outweigh the risks. Patient agrees to proceed. All the questions were answered. Pt consents to proceed.   HIGHER THAN BASELINE RISK.The nature of the procedure, as well as the risks, benefits, and alternatives were carefully and thoroughly reviewed with the patient. Ample time for discussion and questions allowed. The patient understood, was satisfied, and agreed to proceed.    For colon today. HPI:     Debra [name] is a 46 y.o. female  With morbid obesity (BMI>50, despite lap band), HTN, CVA on Brilinta, HLD, DM2, asthma   For screening colon.   No nausea, vomiting, heartburn, regurgitation, odynophagia or dysphagia.  No significant diarrhea or constipation (better with senna 1/day and increased water intake).  No melena or hematochezia. No unintentional weight loss. No abdominal pain.   On brilinta since 2019 d/t CVA.            Past Medical History:  Diagnosis Date   Allergic rhinitis, cause unspecified     Arthritis     Asthma     Diabetes mellitus type II 08/2009 dx   Endometriosis     Hyperlipemia     Hypertension     Knee pain, right      DJD, post traumatic with repeat falls   Lactose intolerance     Morbid obesity ([place])      s/p lab band 09/2010   Osteoarthritis     Ovarian cyst      right s/p resection June 2013   Stroke [facility]) 2019           Past Surgical History:  Procedure Laterality Date   ABDOMINAL HYSTERECTOMY   05/14/12    [place] [facility]   IR ANGIO INTRA EXTRACRAN SEL COM  CAROTID INNOMINATE BILAT MOD SED   07/04/2017   IR ANGIO INTRA EXTRACRAN SEL COM CAROTID INNOMINATE UNI R MOD SED   08/26/2019   IR ANGIO VERTEBRAL SEL VERTEBRAL BILAT MOD SED   07/04/2017   IR ANGIO VERTEBRAL SEL VERTEBRAL BILAT MOD SED   08/26/2019   IR INTRA CRAN STENT   07/07/2017   IR RADIOLOGIST EVAL & MGMT   08/13/2017   IR US GUIDE VASC ACCESS RIGHT   08/26/2019   LAPAROSCOPIC GASTRIC BANDING   10/02/2010    start weight 349#   RADIOLOGY WITH ANESTHESIA N/A 07/07/2017    Procedure: STENTING;  Surgeon: Luanne Bras, MD;  Location: [place];  Service: Radiology;  Laterality: N/A;   SALPINGOOPHORECTOMY   10/08/11    right           Family History  Problem Relation Age of Onset   Heart disease Mother     Hypertension Mother     Diabetes Mother     Stroke Father 47   Diabetes Father     Hypertension Father     Hyperlipidemia Father     Heart disease Father     Kidney disease Father     Cancer Father  Liver disease Father     Obesity Father     Colon cancer Neg Hx     Stomach cancer Neg Hx     Esophageal cancer Neg Hx     Rectal cancer Neg Hx        Social History         Tobacco Use   Smoking status: Never   Smokeless tobacco: Never  Vaping Use   Vaping Use: Never used  Substance Use Topics   Alcohol use: No      Alcohol/week: 0.0 standard drinks of alcohol   Drug use: No            Current Outpatient Medications  Medication Sig Dispense Refill   albuterol (PROAIR HFA) 108 (90 BASE) MCG/ACT inhaler Inhale 2 puffs into the lungs every 4 (four) hours as needed for wheezing or shortness of breath. 18 g 2   albuterol (PROVENTIL) (2.5 MG/3ML) 0.083% nebulizer solution Take 2.5 mg by nebulization every 6 (six) hours as needed for wheezing or shortness of breath.       blood glucose meter kit and supplies Dispense based on patient and insurance preference. Use up to four times daily as directed. (FOR ICD-9 250.00, 250.01). 1 each 0   Blood Glucose Monitoring Suppl (ONE  TOUCH ULTRA 2) w/Device KIT 1 application. by Does not apply route daily at 4 PM. 1 kit 0   Continuous Blood Gluc Sensor MISC 1 each by Does not apply route as directed. Use as directed every 14 days. May dispense FreeStyle [employer] or similar. 1 each 0   EPINEPHrine 0.3 mg/0.3 mL IJ SOAJ injection Inject 0.3 mLs (0.3 mg total) into the muscle as needed for anaphylaxis. 1 each 1   fexofenadine (ALLEGRA) 180 MG tablet Take 180 mg by mouth daily.        fluticasone (FLONASE) 50 MCG/ACT nasal spray Place 2 sprays into both nostrils daily. (Patient taking differently: Place 2 sprays into both nostrils daily as needed for allergies.) 16 g 6   glimepiride (AMARYL) 4 MG tablet Take 4 mg by mouth daily.       glucose blood test strip Used to check blood sugars up to four times daily as needed 200 each 3   ibuprofen (ADVIL) 800 MG tablet Take 800 mg by mouth as needed.       Lancets (ONETOUCH DELICA PLUS [id number]) MISC 1 application. by Does not apply route daily. 100 each 11   montelukast (SINGULAIR) 10 MG tablet Take 1 tablet (10 mg total) by mouth at bedtime. (Patient taking differently: Take 10 mg by mouth at bedtime as needed.) 30 tablet 3   Multiple Vitamin (MULTIVITAMIN) tablet Take 1 tablet by mouth daily.       olopatadine (PATANOL) 0.1 % ophthalmic solution Place 1 drop into both eyes 2 (two) times daily as needed for allergies.        senna (SENOKOT) 8.6 MG TABS tablet Take 1 tablet by mouth daily.       sitaGLIPtin (JANUVIA) 100 MG tablet 1 tablet       ticagrelor (BRILINTA) 90 MG TABS tablet TAKE 1/2 TABLET BY MOUTH TWICE A DAY 30 tablet 5   Vitamin D, Ergocalciferol, (DRISDOL) 1.25 MG (50000 UNIT) CAPS capsule Take 1 capsule (50,000 Units total) by mouth every 7 (seven) days. 12 capsule 0    No current facility-administered medications for this visit.           Allergies  Allergen Reactions  Nutritional Supplements Anaphylaxis and Rash      NUT allergy   Dust Mite Extract      Metformin And Related Diarrhea   Morphine Hives   Augmentin [Amoxicillin-Pot Clavulanate] Rash      To bilat post hands only   Influenza Vaccines Itching and Rash      Review of Systems:  Constitutional: Denies fever, chills, diaphoresis, appetite change and fatigue.  HEENT: Denies photophobia, eye pain, redness, hearing loss, ear pain, congestion, sore throat, rhinorrhea, sneezing, mouth sores, neck pain, neck stiffness and tinnitus.   Respiratory: Denies SOB, DOE, cough, chest tightness,  and wheezing.   Cardiovascular: Denies chest pain, palpitations and leg swelling.  Genitourinary: Denies dysuria, urgency, frequency, hematuria, flank pain and difficulty urinating.  Musculoskeletal: Denies myalgias, back pain, joint swelling, arthralgias and gait problem.  Skin: No rash.  Neurological: Denies dizziness, seizures, syncope, weakness, light-headedness, numbness and headaches.  Hematological: Denies adenopathy. Easy bruising, personal or family bleeding history  Psychiatric/Behavioral: No anxiety or depression       Physical Exam:     BP 128/78   Pulse 65   Ht '5\' 8"'  (1.727 m)   Wt (!) 341 lb 8 oz (154.9 kg)   LMP 07/28/2011   SpO2 99%   BMI 51.92 kg/m     Wt Readings from Last 3 Encounters:  10/30/21 (!) 341 lb 8 oz (154.9 kg)  08/21/21 (!) 337 lb 6.4 oz (153 kg)  06/21/21 (!) 337 lb 9.6 oz (153.1 kg)    Constitutional:  Well-developed, in no acute distress. Psychiatric: Normal mood and affect. Behavior is normal. HEENT: Pupils normal.  Conjunctivae are normal. No scleral icterus. Cardiovascular: Normal rate, regular rhythm. No edema Pulmonary/chest: Effort normal and breath sounds normal. No wheezing, rales or rhonchi. Abdominal: Soft, nondistended. Nontender. Bowel sounds active throughout. There are no masses palpable. No hepatomegaly. Rectal: Deferred Neurological: Alert and oriented to person place and time. Skin: Skin is warm and dry. No rashes noted.   Data  Reviewed: I have personally reviewed following labs and imaging studies   CBC:     Latest Ref Rng & Units 08/21/2021   11:03 AM 07/10/2020    1:55 PM 11/30/2019    1:26 AM  CBC  WBC 4.0 - 10.5 K/uL 8.5  8.0  7.5   Hemoglobin 12.0 - 15.0 g/dL 12.6  12.9  13.2   Hematocrit 36.0 - 46.0 % 39.2  39.9  43.0   Platelets 150.0 - 400.0 K/uL 280.0  315.0  257       CMP:     Latest Ref Rng & Units 08/21/2021   11:03 AM 07/10/2020    1:55 PM 12/15/2019   10:56 AM  CMP  Glucose 70 - 99 mg/dL 248  224  379   BUN 6 - 23 mg/dL '14  15  11   ' Creatinine 0.40 - 1.20 mg/dL 1.25  0.90  0.99   Sodium 135 - 145 mEq/L 139  138  135   Potassium 3.5 - 5.1 mEq/L 3.7  3.6  4.2   Chloride 96 - 112 mEq/L 103  101  96   CO2 19 - 32 mEq/L '29  29  30   ' Calcium 8.4 - 10.5 mg/dL 8.6  9.2  9.3   Total Protein 6.0 - 8.3 g/dL 6.4  6.8     Total Bilirubin 0.2 - 1.2 mg/dL 0.3  0.3     Alkaline Phos 39 - 117 U/L 90  80  AST 0 - 37 U/L 11  10     ALT 0 - 35 U/L 17  [place], MD [facility] [phone]

## 2022-01-17 NOTE — Anesthesia Postprocedure Evaluation (Signed)
Anesthesia Post Note  Patient: Debra [name]  Procedure(s) Performed: COLONOSCOPY WITH PROPOFOL     Patient location during evaluation: PACU Anesthesia Type: MAC Level of consciousness: awake and alert Pain management: pain level controlled Vital Signs Assessment: post-procedure vital signs reviewed and stable Respiratory status: spontaneous breathing, nonlabored ventilation, respiratory function stable and patient connected to nasal cannula oxygen Cardiovascular status: stable and blood pressure returned to baseline Postop Assessment: no apparent nausea or vomiting Anesthetic complications: no   No notable events documented.  Last Vitals:  Vitals:   01/17/22 0900 01/17/22 0910  BP: (!) 127/98 (!) 161/84  Pulse: 66 61  Resp: 11 14  Temp: 37.1 C   SpO2: 100% 98%    Last Pain:  Vitals:   01/17/22 0910  TempSrc:   PainSc: 0-No pain                 Tobin Cadiente S

## 2022-01-17 NOTE — Transfer of Care (Signed)
Immediate Anesthesia Transfer of Care Note  Patient: Debra [name]  Procedure(s) Performed: COLONOSCOPY WITH PROPOFOL  Patient Location: PACU  Anesthesia Type:MAC  Level of Consciousness: sedated  Airway & Oxygen Therapy: Patient Spontanous Breathing and Patient connected to face mask oxygen  Post-op Assessment: Report given to RN and Post -op Vital signs reviewed and stable  Post vital signs: Reviewed and stable  Last Vitals:  Vitals Value Taken Time  BP    Temp    Pulse    Resp    SpO2      Last Pain:  Vitals:   01/17/22 0739  TempSrc: Tympanic  PainSc: 0-No pain         Complications: No notable events documented.

## 2022-01-17 NOTE — Anesthesia Preprocedure Evaluation (Signed)
Anesthesia Evaluation  Patient identified by MRN, date of birth, ID band Patient awake    Reviewed: Allergy & Precautions, NPO status , Patient's Chart, lab work & pertinent test results  Airway Mallampati: II  TM Distance: >3 FB Neck ROM: Full    Dental no notable dental hx.    Pulmonary asthma , sleep apnea ,    Pulmonary exam normal breath sounds clear to auscultation       Cardiovascular hypertension, Normal cardiovascular exam Rhythm:Regular Rate:Normal     Neuro/Psych CVA negative psych ROS   GI/Hepatic negative GI ROS, Neg liver ROS,   Endo/Other  diabetes, Type 2Morbid obesity  Renal/GU negative Renal ROS  negative genitourinary   Musculoskeletal negative musculoskeletal ROS (+)   Abdominal   Peds negative pediatric ROS (+)  Hematology negative hematology ROS (+)   Anesthesia Other Findings   Reproductive/Obstetrics negative OB ROS                             Anesthesia Physical Anesthesia Plan  ASA: 3  Anesthesia Plan: MAC   Post-op Pain Management: Minimal or no pain anticipated   Induction: Intravenous  PONV Risk Score and Plan: 2 and Propofol infusion and Treatment may vary due to age or medical condition  Airway Management Planned: Simple Face Mask  Additional Equipment:   Intra-op Plan:   Post-operative Plan:   Informed Consent: I have reviewed the patients History and Physical, chart, labs and discussed the procedure including the risks, benefits and alternatives for the proposed anesthesia with the patient or authorized representative who has indicated his/her understanding and acceptance.     Dental advisory given  Plan Discussed with: CRNA and Surgeon  Anesthesia Plan Comments:         Anesthesia Quick Evaluation

## 2022-01-17 NOTE — Op Note (Addendum)
[facility] Patient Name: Debra [name] Procedure Date: 01/17/2022 MRN: [id number] Attending MD: Jackquline Denmark , MD Date of Birth: 1976-03-22 CSN: [id number] Age: 46 Admit Type: Outpatient Procedure:                Colonoscopy Indications:              Screening for colorectal malignant neoplasm Providers:                Jackquline Denmark, MD, Burtis Junes, RN, Luan Moore,                            Technician, Brien Mates, RNFA Referring MD:              Medicines:                Monitored Anesthesia Care Complications:            No immediate complications. Estimated Blood Loss:     Estimated blood loss: none. Procedure:                Pre-Anesthesia Assessment:                           - Prior to the procedure, a History and Physical                            was performed, and patient medications and                            allergies were reviewed. The patient's tolerance of                            previous anesthesia was also reviewed. The risks                            and benefits of the procedure and the sedation                            options and risks were discussed with the patient.                            All questions were answered, and informed consent                            was obtained. Prior Anticoagulants: Brilinta was                            held 5 days prior. ASA Grade Assessment: II - A                            patient with mild systemic disease. After reviewing                            the risks and benefits, the patient was deemed in  satisfactory condition to undergo the procedure.                           After obtaining informed consent, the colonoscope                            was passed under direct vision. Throughout the                            procedure, the patient's blood pressure, pulse, and                            oxygen saturations were monitored continuously. The                             CF-HQ190L ([id number]) Olympus colonoscope was                            introduced through the anus and advanced to the 2                            cm into the ileum. The colonoscopy was performed                            without difficulty. The patient tolerated the                            procedure well. The quality of the bowel                            preparation was good. The terminal ileum, ileocecal                            valve, appendiceal orifice, and rectum were                            photographed. Scope In: 8:43:21 AM Scope Out: 8:54:41 AM Scope Withdrawal Time: 0 hours 9 minutes 19 seconds  Total Procedure Duration: 0 hours 11 minutes 20 seconds  Findings:      The colon (entire examined portion) appeared normal.      Non-bleeding internal hemorrhoids were found during retroflexion. The       hemorrhoids were small and Grade I (internal hemorrhoids that do not       prolapse).      The terminal ileum appeared normal.      The exam was otherwise without abnormality on direct and retroflexion       views. Impression:               - Non-bleeding internal hemorrhoids.                           - Otherwise normal colonoscopy to TI.                           - No specimens collected. Moderate Sedation:  Not Applicable - Patient had care per Anesthesia. Recommendation:           - Patient has a contact number available for                            emergencies. The signs and symptoms of potential                            delayed complications were discussed with the                            patient. Return to normal activities tomorrow.                            Written discharge instructions were provided to the                            patient.                           - Resume previous diet.                           - Continue present medications.                           - Repeat colonoscopy in 10 years for screening                             purposes. Earlier, if with any new problems or                            change in family history.                           - Resume Brilinta from tomorrow onwards.                           - The findings and recommendations were discussed                            with the patient's family. Procedure Code(s):        --- Professional ---                           O0321, Colorectal cancer screening; colonoscopy on                            individual not meeting criteria for high risk Diagnosis Code(s):        --- Professional ---                           Z12.11, Encounter for screening for malignant                            neoplasm of colon  K64.0, First degree hemorrhoids CPT copyright 2019 American Medical Association. All rights reserved. The codes documented in this report are preliminary and upon coder review may  be revised to meet current compliance requirements. Jackquline Denmark, MD 01/17/2022 8:58:19 AM This report has been signed electronically. Number of Addenda: 0

## 2022-01-17 NOTE — Discharge Instructions (Signed)

## 2022-01-17 NOTE — Anesthesia Procedure Notes (Signed)
Procedure Name: MAC Date/Time: 01/17/2022 8:36 AM  Performed by: Lollie Sails, CRNAPre-anesthesia Checklist: Patient identified, Emergency Drugs available, Suction available, Patient being monitored and Timeout performed Oxygen Delivery Method: Simple face mask Placement Confirmation: positive ETCO2

## 2022-01-21 ENCOUNTER — Encounter (HOSPITAL_COMMUNITY): Payer: Self-pay | Admitting: Gastroenterology

## 2022-01-31 LAB — HM DIABETES EYE EXAM

## 2022-02-05 ENCOUNTER — Encounter: Payer: Self-pay | Admitting: *Deleted

## 2022-02-25 ENCOUNTER — Telehealth: Payer: Self-pay | Admitting: Internal Medicine

## 2022-02-25 ENCOUNTER — Other Ambulatory Visit: Payer: Self-pay

## 2022-02-25 NOTE — Telephone Encounter (Signed)
Patient needs a prescription for one touch Ultra test strips sent to [facility] on [address].  Next visit:  03/04/2022 - Last visit:  08/21/2021

## 2022-02-27 NOTE — Telephone Encounter (Signed)
Okay to refill? 

## 2022-02-28 ENCOUNTER — Other Ambulatory Visit: Payer: Self-pay

## 2022-02-28 MED ORDER — ONETOUCH ULTRA 2 W/DEVICE KIT: 1.0000 "application " | PACK | Freq: Every day | 0 refills | Status: DC

## 2022-02-28 NOTE — Telephone Encounter (Signed)
Sent in refill

## 2022-03-04 ENCOUNTER — Ambulatory Visit (INDEPENDENT_AMBULATORY_CARE_PROVIDER_SITE_OTHER): Payer: Commercial Managed Care - HMO | Admitting: Internal Medicine

## 2022-03-04 ENCOUNTER — Encounter: Payer: Self-pay | Admitting: Internal Medicine

## 2022-03-04 VITALS — BP 128/80 | HR 62 | Temp 99.0°F | Ht 68.0 in | Wt 336.1 lb

## 2022-03-04 DIAGNOSIS — E118 Type 2 diabetes mellitus with unspecified complications: Secondary | ICD-10-CM

## 2022-03-04 LAB — POCT GLYCOSYLATED HEMOGLOBIN (HGB A1C): HbA1c POC (<> result, manual entry): 12.2 % (ref 4.0–5.6)

## 2022-03-04 NOTE — Assessment & Plan Note (Signed)
POC HgA1c 12.2 today which is above prior. She is in adjustment due to lack of coverage for mounjaro which was helping. She has just started basaglar and working with endo on dosing. Taking amaryl 4 mg daily and januvia 100 mg daily which is not controlling her sugars. Continue to follow with endo and reminded about long term risk of harm with elevated sugars.

## 2022-03-04 NOTE — Patient Instructions (Addendum)
Your HGA1c is 12.2 today.   We will get the papers filled out for you.

## 2022-03-04 NOTE — Progress Notes (Signed)
   Subjective:   Patient ID: Debra [name], female    DOB: 1975/05/02, 46 y.o.   MRN: [id number]  HPI The patient is a 46 YO female coming in for follow up. Seeing endo and they are adjusting meds. Greggory Keen was doing well but insurance stopped covering. Ozempic made her feel bad. She is taking basaglar in the last week or so and adjusting dose. A lot of stress due to her mom.  Review of Systems  Constitutional: Negative.   HENT: Negative.    Eyes: Negative.   Respiratory:  Negative for cough, chest tightness and shortness of breath.   Cardiovascular:  Negative for chest pain, palpitations and leg swelling.  Gastrointestinal:  Negative for abdominal distention, abdominal pain, constipation, diarrhea, nausea and vomiting.  Musculoskeletal: Negative.   Skin: Negative.   Neurological: Negative.   Psychiatric/Behavioral: Negative.      Objective:  Physical Exam Constitutional:      Appearance: She is well-developed.  HENT:     Head: Normocephalic and atraumatic.  Cardiovascular:     Rate and Rhythm: Normal rate and regular rhythm.  Pulmonary:     Effort: Pulmonary effort is normal. No respiratory distress.     Breath sounds: Normal breath sounds. No wheezing or rales.  Abdominal:     General: Bowel sounds are normal. There is no distension.     Palpations: Abdomen is soft.     Tenderness: There is no abdominal tenderness. There is no rebound.  Musculoskeletal:     Cervical back: Normal range of motion.  Skin:    General: Skin is warm and dry.  Neurological:     Mental Status: She is alert and oriented to person, place, and time.     Coordination: Coordination normal.     Vitals:   03/04/22 1434  BP: 128/80  Pulse: 62  Temp: 99 F (37.2 C)  TempSrc: Oral  SpO2: 99%  Weight: (!) 336 lb 2 oz (152.5 kg)  Height: 5\' 8"  (1.727 m)    Assessment & Plan:

## 2022-04-01 ENCOUNTER — Other Ambulatory Visit: Payer: Self-pay

## 2022-04-01 ENCOUNTER — Telehealth: Payer: Self-pay | Admitting: Internal Medicine

## 2022-04-01 DIAGNOSIS — E118 Type 2 diabetes mellitus with unspecified complications: Secondary | ICD-10-CM

## 2022-04-01 MED ORDER — GLUCOSE BLOOD VI STRP: ORAL_STRIP | 3 refills | Status: DC

## 2022-04-01 NOTE — Telephone Encounter (Signed)
Ok to send

## 2022-04-01 NOTE — Telephone Encounter (Signed)
Patient called stating that we keep on sending the wrong blood strips to her her pharmacy. She states that the correct one is glucose blood test strip . Patient is requesting we send in a refill to her pharmacy [facility] [phone] - [place], [place] - [address] AT [facility] [address]

## 2022-04-26 ENCOUNTER — Other Ambulatory Visit: Payer: Self-pay | Admitting: Internal Medicine

## 2022-04-26 DIAGNOSIS — I6603 Occlusion and stenosis of bilateral middle cerebral arteries: Secondary | ICD-10-CM

## 2022-05-15 ENCOUNTER — Encounter: Payer: Self-pay | Admitting: Internal Medicine

## 2022-06-26 ENCOUNTER — Telehealth: Payer: Self-pay | Admitting: Internal Medicine

## 2022-06-26 MED ORDER — LOSARTAN POTASSIUM-HCTZ 50-12.5 MG PO TABS: 1.0000 | ORAL_TABLET | Freq: Every day | ORAL | 2 refills | Status: DC

## 2022-06-26 NOTE — Telephone Encounter (Signed)
Patient needs an rx for the losartan hct - 100's  - Please send to [facility] on [address]

## 2022-06-26 NOTE — Telephone Encounter (Signed)
Refill has been sent in for patient. 

## 2022-07-02 NOTE — Telephone Encounter (Signed)
Patient said she was on the 100 dosage, but the prescription that was sent in is 50. She would like that to be adjusted to the correct dosage. Best callback number is [phone].

## 2022-07-02 NOTE — Telephone Encounter (Addendum)
Called pt she states she been taking the Losartan/HCTZ 100/12.5 mg. Not sure how 50/12.5 was called to pharmacy. Would like rx for Losartan/hctz 100/12.5 mg.Inform pt MD is out of the office today will be tomorrow before she address msg...Johny Chess

## 2022-07-04 MED ORDER — LOSARTAN POTASSIUM-HCTZ 100-12.5 MG PO TABS: 1.0000 | ORAL_TABLET | Freq: Every day | ORAL | 3 refills | Status: DC

## 2022-07-04 NOTE — Addendum Note (Signed)
Addended by: Pricilla Holm A on: 07/04/2022 11:43 AM   Modules accepted: Orders

## 2022-07-04 NOTE — Telephone Encounter (Signed)
Sent in

## 2022-07-13 ENCOUNTER — Encounter (HOSPITAL_COMMUNITY): Payer: Self-pay | Admitting: *Deleted

## 2022-07-13 ENCOUNTER — Emergency Department (HOSPITAL_COMMUNITY)
Admission: EM | Admit: 2022-07-13 | Discharge: 2022-07-13 | Disposition: A | Discharge status: Home / Self Care | Payer: Commercial Managed Care - HMO | Attending: Emergency Medicine | Admitting: Emergency Medicine

## 2022-07-13 ENCOUNTER — Emergency Department (HOSPITAL_COMMUNITY): Payer: Commercial Managed Care - HMO

## 2022-07-13 ENCOUNTER — Other Ambulatory Visit: Payer: Self-pay

## 2022-07-13 DIAGNOSIS — M79675 Pain in left toe(s): Secondary | ICD-10-CM

## 2022-07-13 DIAGNOSIS — M25561 Pain in right knee: Secondary | ICD-10-CM | POA: Diagnosis present

## 2022-07-13 DIAGNOSIS — Y9222 Religious institution as the place of occurrence of the external cause: Secondary | ICD-10-CM | POA: Insufficient documentation
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
## 2022-07-13 NOTE — Discharge Instructions (Addendum)
You do not have any fractures.  I recommend Tylenol 1000 mg every 6 hours ice compresses can help with the pain especially in your knee.

## 2022-07-15 ENCOUNTER — Telehealth: Payer: Self-pay

## 2022-07-15 NOTE — Transitions of Care (Post Inpatient/ED Visit) (Signed)
   07/15/2022  Name: Debra [name] MRN: [id number] DOB: 31-Dec-1975  Today's TOC FU Call Status: Today's TOC FU Call Status:: Successful TOC FU Call Competed TOC FU Call Complete Date: 07/15/22  Transition Care Management Follow-up Telephone Call Date of Discharge: 07/13/22 Discharge [facility]) Type of Discharge: Emergency Department Reason for ED Visit: Other: (right knee pain) How have you been since you were released from the hospital?: Better Any questions or concerns?: No  Items Reviewed: Did you receive and understand the discharge instructions provided?: Yes Medications obtained and verified?: Yes (Medications Reviewed) Any new allergies since your discharge?: No Dietary orders reviewed?: NA Do you have support at home?: No  Home Care and Equipment/Supplies: [facility] Ordered?: NA Any new equipment or medical supplies ordered?: NA  Functional Questionnaire: Do you need assistance with bathing/showering or dressing?: No Do you need assistance with meal preparation?: No Do you need assistance with eating?: No Do you have difficulty maintaining continence: No Do you need assistance with getting out of bed/getting out of a chair/moving?: No Do you have difficulty managing or taking your medications?: No  Follow up appointments reviewed: PCP Follow-up appointment confirmed?: [facility] Follow-up appointment confirmed?: NA Do you need transportation to your follow-up appointment?: No Do you understand care options if your condition(s) worsen?: Yes-patient verbalized understanding    [place], [place] Direct Dial [phone]

## 2022-10-10 ENCOUNTER — Telehealth: Payer: Self-pay | Admitting: Internal Medicine

## 2022-10-10 NOTE — Telephone Encounter (Signed)
Pt called wanting to know if Dr. Okey Dupre could prescribe  sitaGLIPtin (JANUVIA) 100 MG tablet  or do she need to be seen. Pt blood sugar been running high even when she takes her insulin. Pt tried getting it from the other location where it was prescribed to her but they said she need to get it from her PCP. Please advise.

## 2022-10-11 NOTE — Telephone Encounter (Signed)
Pt has been scheduled for he follow in July

## 2022-10-11 NOTE — Telephone Encounter (Signed)
She is overdue for diabetes follow up please schedule visit can address then.

## 2022-10-19 ENCOUNTER — Other Ambulatory Visit (HOSPITAL_COMMUNITY): Payer: Self-pay

## 2022-10-31 LAB — LAB REPORT - SCANNED
A1c: 10.1
Albumin, Urine POC: 10.2
Albumin/Creatinine Ratio, Urine, POC: 6
Creatinine, POC: 171.4 mg/dL
EGFR: 97

## 2022-11-13 ENCOUNTER — Ambulatory Visit: Payer: Commercial Managed Care - HMO | Admitting: Internal Medicine

## 2022-11-20 ENCOUNTER — Encounter (INDEPENDENT_AMBULATORY_CARE_PROVIDER_SITE_OTHER): Payer: Self-pay

## 2022-12-03 ENCOUNTER — Ambulatory Visit: Payer: Commercial Managed Care - HMO | Admitting: Internal Medicine

## 2022-12-03 ENCOUNTER — Encounter: Payer: Self-pay | Admitting: Internal Medicine

## 2022-12-03 VITALS — BP 160/80 | HR 62 | Temp 98.7°F | Ht 68.0 in | Wt 347.0 lb

## 2022-12-03 DIAGNOSIS — E118 Type 2 diabetes mellitus with unspecified complications: Secondary | ICD-10-CM

## 2022-12-03 DIAGNOSIS — Z1159 Encounter for screening for other viral diseases: Secondary | ICD-10-CM | POA: Diagnosis not present

## 2022-12-03 DIAGNOSIS — I6603 Occlusion and stenosis of bilateral middle cerebral arteries: Secondary | ICD-10-CM

## 2022-12-03 DIAGNOSIS — Z0000 Encounter for general adult medical examination without abnormal findings: Secondary | ICD-10-CM | POA: Diagnosis not present

## 2022-12-03 DIAGNOSIS — E1169 Type 2 diabetes mellitus with other specified complication: Secondary | ICD-10-CM

## 2022-12-03 DIAGNOSIS — I10 Essential (primary) hypertension: Secondary | ICD-10-CM

## 2022-12-03 DIAGNOSIS — Z7984 Long term (current) use of oral hypoglycemic drugs: Secondary | ICD-10-CM

## 2022-12-03 DIAGNOSIS — Z8673 Personal history of transient ischemic attack (TIA), and cerebral infarction without residual deficits: Secondary | ICD-10-CM

## 2022-12-03 DIAGNOSIS — Z0001 Encounter for general adult medical examination with abnormal findings: Secondary | ICD-10-CM

## 2022-12-03 DIAGNOSIS — E785 Hyperlipidemia, unspecified: Secondary | ICD-10-CM

## 2022-12-03 MED ORDER — LOSARTAN POTASSIUM-HCTZ 100-12.5 MG PO TABS: 1.0000 | ORAL_TABLET | Freq: Every day | ORAL | 3 refills | Status: DC

## 2022-12-03 MED ORDER — SITAGLIPTIN PHOSPHATE 100 MG PO TABS: 100.0000 mg | ORAL_TABLET | Freq: Every day | ORAL | 3 refills | Status: DC

## 2022-12-03 MED ORDER — TICAGRELOR 90 MG PO TABS
45.0000 mg | ORAL_TABLET | Freq: Two times a day (BID) | ORAL | 11 refills | Status: DC
Start: 2022-12-03 — End: 2023-12-25

## 2022-12-03 MED ORDER — FLUTICASONE PROPIONATE 50 MCG/ACT NA SUSP: 2.0000 | Freq: Every day | NASAL | 3 refills | Status: AC | PRN

## 2022-12-03 MED ORDER — ALBUTEROL SULFATE (2.5 MG/3ML) 0.083% IN NEBU: 2.5000 mg | INHALATION_SOLUTION | Freq: Four times a day (QID) | RESPIRATORY_TRACT | 3 refills | Status: AC | PRN

## 2022-12-03 MED ORDER — ALBUTEROL SULFATE HFA 108 (90 BASE) MCG/ACT IN AERS: 2.0000 | INHALATION_SPRAY | RESPIRATORY_TRACT | 2 refills | Status: DC | PRN

## 2022-12-03 NOTE — Assessment & Plan Note (Signed)
Flu shot yearly. Tetanus up to date. Colonoscopy up to date. Mammogram up to date, pap smear up to date. Counseled about sun safety and mole surveillance. Counseled about the dangers of distracted driving. Given 10 year screening recommendations.   

## 2022-12-03 NOTE — Assessment & Plan Note (Signed)
Previously on crestor 40 mg daily she had stopped. LDL 110s on recent lipid panel and should resume.

## 2022-12-03 NOTE — Assessment & Plan Note (Signed)
BP above goal today and she is often taking 1/2 pill of losaran/hydrochlorothiazide 100/12.5 mg daily and this is usually at goal. Advised that high BP can increase risk of stroke and she is aware. Recent labs reviewed on her phone and appropriate to continue.

## 2022-12-03 NOTE — Assessment & Plan Note (Signed)
Continue brilinta and no new stroke symptoms.

## 2022-12-03 NOTE — Assessment & Plan Note (Signed)
She is working on weight loss but struggling with mounjaro not being covered through insurance that had been helpful and now on insulin which causes weight gain.

## 2022-12-03 NOTE — Progress Notes (Signed)
   Subjective:   Patient ID: Debra [name], female    DOB: [birth year], 47 y.o.   MRN: [id number]  HPI The patient is here for physical.  PMH, [facility], social history reviewed and updated  Review of Systems  Constitutional: Negative.   HENT: Negative.    Eyes: Negative.   Respiratory:  Negative for cough, chest tightness and shortness of breath.   Cardiovascular:  Negative for chest pain, palpitations and leg swelling.  Gastrointestinal:  Negative for abdominal distention, abdominal pain, constipation, diarrhea, nausea and vomiting.  Musculoskeletal: Negative.   Skin: Negative.   Neurological: Negative.   Psychiatric/Behavioral: Negative.      Objective:  Physical Exam Constitutional:      Appearance: She is well-developed. She is obese.  HENT:     Head: Normocephalic and atraumatic.  Cardiovascular:     Rate and Rhythm: Normal rate and regular rhythm.  Pulmonary:     Effort: Pulmonary effort is normal. No respiratory distress.     Breath sounds: Normal breath sounds. No wheezing or rales.  Abdominal:     General: Bowel sounds are normal. There is no distension.     Palpations: Abdomen is soft.     Tenderness: There is no abdominal tenderness. There is no rebound.  Musculoskeletal:     Cervical back: Normal range of motion.  Skin:    General: Skin is warm and dry.  Neurological:     Mental Status: She is alert and oriented to person, place, and time.     Coordination: Coordination normal.     Vitals:   12/03/22 1303 12/03/22 1307  BP: (!) 160/80 (!) 160/80  Pulse: 62   Temp: 98.7 F (37.1 C)   TempSrc: Oral   SpO2: 97%   Weight: (!) 347 lb (157.4 kg)   Height: 5\' 8"  (1.727 m)     Assessment & Plan:

## 2022-12-03 NOTE — Assessment & Plan Note (Signed)
Recent labs with HgA1c 10.1 and urine microalbumin to creatinine urine ratio and CMP and lipid panel done. She is struggling with caretaking and finding time for herself. On ARB not on statin.

## 2022-12-03 NOTE — Assessment & Plan Note (Signed)
Off crestor since last year and LDL above goal. Diabetes above goal. BP at goal. She is working on these things. No new stroke symptoms. Continue brilinta.

## 2022-12-17 NOTE — Progress Notes (Deleted)
PATIENT: Debra [name] DOB: Aug 31, 1975  REASON FOR VISIT: follow up HISTORY FROM: patient  No chief complaint on file.   HISTORY OF PRESENT ILLNESS:  12/17/22 ALL: Debra [name] returns for follow up for OSA on CPAP.     12/12/2021 ALL: Debra [name] returns for follow up for OSA on CPAP. She continues to do well on therapy. She is using her CPAP nightly for about 7-8 hours. She is doing well. She denies concerns with machine or supplies. She has been curious about need for therapy long term. She reports normal BP is 120-130/60-70. She continues Brilinta. She has not seen Dr Corliss Skains since 2021.     12/12/2020 ALL: Debra [name] returns for follow up for OSA on CPAP. She reports doing well with CPAP. She is using therapy every night. She denies concerns with machine. She feels DME sends supplies too often.   She is followed closely by PCP for stroke prevention. She continues rosuvastatin and Brillenta. BP is well managed. LDL 59 and HbA1C 10.2 in 06/2020. She was recently referred to endocrinology. She was last seen 2 weeks ago.     12/13/2019 ALL:  Debra [name] is a 47 y.o. female here today for follow up for OSA on CPAP.  Debra [name] is doing very well on CPAP therapy.  She continues to use her CPAP nightly.  She reports that she has had a change in insurance and has not been able to get new supplies.  We will assist with a prescription for new supplies today.  She is feeling well and without any concerns.  Compliance report dated 11/09/2019 through 12/08/2019 reveals that she used CPAP 29 of the past 30 days for compliance of 97%.  She used CPAP greater than 4 hours 29 of the past 30 days for compliance of 97%.  Average usage was 8 hours and 43 minutes.  Residual AHI was 0.3 on 6 to 12 cm of water and an EPR of 3.  There was no significant leak noted.  HISTORY: (copied from my note on 06/15/2019)  Debra [name] is a 47 y.o. female here today for follow up for OSA on CPAP. Sleep study revealed  mild OSA with total AHI of 5.5/hr but strong REM dependant OSA with AHI of 24/hr. She was started on auto PAP therapy at 6-12cmH20 pressure and medium Dreamwear FFM. She is doing very well with CPAP therapy.  She has adjusted well and noted significant improvement in sleep quality and increased energy.  She does report that he had humidity is, at times, too hot and causes her to have hot flash.  She has not tried adjusting the settings on her CPAP machine at home.   Compliance report dated 05/15/2019 through 06/13/2019 reveals that she used CPAP 30 out of the last 30 days for compliance of 100%.  26 of the last 30 days she used CPAP greater than 4 hours for compliance of 87%.  Average usage was 6 hours and 50 minutes.  Residual AHI was 0.2 on 6 to 12 cm of water and an EPR of 3.  There was no significant leak noted.     HISTORY: (copied from Dr Dohmeier's note on 12/10/2018)   Debra [name] is a 47 y.o. year old Black or [place] American female patient seen here upon referral by George Hugh on 12/10/2018     Chief concern according to patient : " My sleeping patten has changed with my recent stroke- and started in March 19th  2019 ""  My doctors d/c my hormone replacement therapy  after my strokewhich has helped me to sleep'-      I have the pleasure of seeing Debra [name] today, a right-handed [place] American female with a possible sleep disorder.  She has had a stroke in the left MCA - Dr. Roda Shutters.   has a past medical history of Allergic rhinitis, cause unspecified, Arthritis, Asthma, Diabetes mellitus type II (08/2009 dx), Endometriosis, Hyperlipemia, Hypertension, Knee pain, right, Lactose intolerance, Morbid obesity (HCC), Osteoarthritis, Ovarian cyst, and Stroke (HCC). she is in surgical menopause , status post total hysterectomy ( 2015). The patient never had a sleep study.    History Summary Debra [name] is a 47 y.o. female with history of HTN, type 2 DM, morbid obesity,  endometriosis/ovarian cyst s/p hysterectomy/SBO and seasonal allergies admitted on 06/30/17 for slurred speech and expressive aphasia.  CT head showed small cortical hypoattenuation within the left parietal occipital junction possible acute infarct. CTA head and neck on admission multiple anterior and posterior stenosis, and MRI showed a cluster of acute infarct posterior left temporal lobe and temporoparietal junction region in the left MCA territory.  After admission, patient had fluctuating left MCA syndrome especially with positional changes and BP changes.  Repeat CT showed worsening stable left medial occipital lobe infarct.  Repeat CT head and neck showed unchanged bilateral high-grade M2 stenosis.  CT perfusion with small perfusion deficit, not amendable to intervention.  Repeat MRI showed patchy acute infarct in the left posterior MCA and watershed distributions.  Underwent cerebral angiogram on 07/04/2017, found to have left MCA inferior division >75% stenosis and right M1 80% stenosis, no vasculitis but atherosclerosis.  At end of procedure patient had a vasovagal event while holding pressure at the right groin.  BP dropped and she again had left MCA syndrome.  She was given IV bolus, put into Trendelenburg position and treated with pressors.  BP improved and symptoms resolved.  She was admitted in ICU after procedure, continues to have mild expressive aphasia but motor function at baseline.  LDL 129, and A1c 8.5.  UDS negative.  Hypercoagulable work-up negative.  She received left MCA stenting on 07/07/2017.  Her symptoms improved and was discharged home on 07/08/2017 with home health PT/speech.  Home medication including aspirin, Brilinta and Lipitor 80.   Sleep relevant medical history:  Tonsillectomy; none , cervical spine surgery no ENT surgery or trauma. She is morbidly obese.      Family medical /sleep history: No other family member on CPAP with OSA. father had a CVA in his 41s and remained  aphasic. HTN, DM .    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   and lives in a household with 2 persons- she and her mother . Family status is single. Pets are not  present. Tobacco use: none .  ETOH use ; none . Caffeine intake in form of Coffee (none) Soda(2 cans a day ) Tea ( rarely) , no energy drinks. Regular exercise in form of walking  Hobbies :  Shooting.    Sleep habits are as follows:  The patient's dinner time is between 6-7 PM. The patient goes to bed at 11-12 PM and continues to have trouble to initiate sleep for several hours, most nights. If asleep, stays asleep for hours. She is easily woken by sounds She may wake for one bathroom break -  but cannot go back to sleep after. Her bedroom is not cool, quiet and dark- her mother needs the home warmer. She sweats a lot.  The preferred sleep position is lateral , with the support of 2 pillows. Dreams are reportedly frequent and vivid. Not nightmarish.    10.30 AM is the usual rise time. The patient wakes up spontaneously.  She reports not feeling refreshed or restored in AM, rarely having symptoms such as dry mouth or morning headaches but residual fatigue.  Naps are not taken.     REVIEW OF SYSTEMS: Out of a complete 14 system review of symptoms, the patient complains only of the following symptoms, none and all other reviewed systems are negative.  ESS: 3/24  ALLERGIES: Allergies  Allergen Reactions   Nutritional Supplements Anaphylaxis and Rash    NUT allergy   Dust Mite Extract     Respiratory problems    Metformin And Related Diarrhea   Morphine Hives   Augmentin [Amoxicillin-Pot Clavulanate] Rash    To bilat post hands only   Influenza Vaccines Itching and Rash    HOME MEDICATIONS: Outpatient Medications Prior to Visit  Medication Sig Dispense Refill   albuterol (PROAIR HFA) 108 (90 Base) MCG/ACT inhaler Inhale 2 puffs into the lungs  every 4 (four) hours as needed for wheezing or shortness of breath. 18 g 2   albuterol (PROVENTIL) (2.5 MG/3ML) 0.083% nebulizer solution Take 3 mLs (2.5 mg total) by nebulization every 6 (six) hours as needed for wheezing or shortness of breath. 75 mL 3   aspirin EC 81 MG tablet Take 81 mg by mouth daily. Swallow whole.     blood glucose meter kit and supplies Dispense based on patient and insurance preference. Use up to four times daily as directed. (FOR ICD-9 250.00, 250.01). 1 each 0   Blood Glucose Monitoring Suppl (ONE TOUCH ULTRA 2) w/Device KIT 1 application  by Does not apply route daily at 4 PM. 1 kit 0   Continuous Blood Gluc Sensor MISC 1 each by Does not apply route as directed. Use as directed every 14 days. May dispense FreeStyle [employer] or similar. 1 each 0   EPINEPHrine 0.3 mg/0.3 mL IJ SOAJ injection Inject 0.3 mLs (0.3 mg total) into the muscle as needed for anaphylaxis. 1 each 1   fexofenadine (ALLEGRA) 180 MG tablet Take 180 mg by mouth daily.      fluticasone (FLONASE) 50 MCG/ACT nasal spray Place 2 sprays into both nostrils daily as needed for allergies. 48 g 3   glimepiride (AMARYL) 4 MG tablet Take 4 mg by mouth daily with breakfast.     glucose blood test strip Used to check blood sugars up to four times daily as needed 200 each 3   ibuprofen (ADVIL) 800 MG tablet Take 800 mg by mouth every 8 (eight) hours as needed for moderate pain.     Insulin Glargine (BASAGLAR KWIKPEN) 100 UNIT/ML Inject 12 Units into the skin daily.     Lancets (ONETOUCH DELICA PLUS LANCET30G) MISC 1 application. by Does not apply route daily. 100 each 11   losartan-hydrochlorothiazide (HYZAAR) 100-12.5 MG tablet Take 1 tablet by mouth daily. 90 tablet 3   Misc Natural Products (ELDERBERRY IMMUNE COMPLEX PO) Take 2 each by mouth daily.     montelukast (SINGULAIR) 10 MG tablet Take 1 tablet (10 mg total) by mouth at bedtime. (Patient taking differently: Take 10 mg by mouth at bedtime as  needed.) 30 tablet 3  Multiple Vitamin (MULTIVITAMIN) tablet Take 1 tablet by mouth daily.     Multiple Vitamins-Minerals (ONE A DAY IMMUNITY DEFENSE PO) Take 1 tablet by mouth daily.     olopatadine (PATANOL) 0.1 % ophthalmic solution Place 1 drop into both eyes 2 (two) times daily as needed for allergies.      senna (SENOKOT) 8.6 MG TABS tablet Take 1 tablet by mouth daily.     sitaGLIPtin (JANUVIA) 100 MG tablet Take 1 tablet (100 mg total) by mouth daily. 90 tablet 3   ticagrelor (BRILINTA) 90 MG TABS tablet Take 0.5 tablets (45 mg total) by mouth 2 (two) times daily. 30 tablet 11   Vitamin D, Ergocalciferol, (DRISDOL) 1.25 MG (50000 UNIT) CAPS capsule Take 1 capsule (50,000 Units total) by mouth every 7 (seven) days. 12 capsule 0   No facility-administered medications prior to visit.    PAST MEDICAL HISTORY: Past Medical History:  Diagnosis Date   Allergic rhinitis, cause unspecified    Arthritis    Asthma    Diabetes mellitus type II 08/2009 dx   Endometriosis    Hyperlipemia    Hypertension    Knee pain, right    DJD, post traumatic with repeat falls   Lactose intolerance    Morbid obesity (HCC)    s/p lab band 09/2010   Osteoarthritis    Ovarian cyst    right s/p resection June 2013   Sleep apnea    wears cpap   Stroke [facility] 2019    PAST SURGICAL HISTORY: Past Surgical History:  Procedure Laterality Date   ABDOMINAL HYSTERECTOMY  05/14/12   [place] [facility]   COLONOSCOPY WITH PROPOFOL N/A 01/17/2022   Procedure: COLONOSCOPY WITH PROPOFOL;  Surgeon: Lynann Bologna, MD;  Location: [facility] ENDOSCOPY;  Service: Gastroenterology;  Laterality: N/A;   IR ANGIO INTRA EXTRACRAN SEL COM CAROTID INNOMINATE BILAT MOD SED  07/04/2017   IR ANGIO INTRA EXTRACRAN SEL COM CAROTID INNOMINATE UNI R MOD SED  08/26/2019   IR ANGIO VERTEBRAL SEL VERTEBRAL BILAT MOD SED  07/04/2017   IR ANGIO VERTEBRAL SEL VERTEBRAL BILAT MOD SED  08/26/2019   IR INTRA CRAN STENT  07/07/2017   IR RADIOLOGIST  EVAL & MGMT  08/13/2017   IR US GUIDE VASC ACCESS RIGHT  08/26/2019   LAPAROSCOPIC GASTRIC BANDING  10/02/2010   start weight 349#   RADIOLOGY WITH ANESTHESIA N/A 07/07/2017   Procedure: STENTING;  Surgeon: Julieanne Cotton, MD;  Location: [facility] OR;  Service: Radiology;  Laterality: N/A;   SALPINGOOPHORECTOMY  10/08/11   right    FAMILY HISTORY: Family History  Problem Relation Age of Onset   Heart disease Mother    Hypertension Mother    Diabetes Mother    Stroke Father 48   Diabetes Father    Hypertension Father    Hyperlipidemia Father    Heart disease Father    Kidney disease Father    Cancer Father    Liver disease Father    Obesity Father    Colon cancer Neg Hx    Stomach cancer Neg Hx    Esophageal cancer Neg Hx    Rectal cancer Neg Hx     SOCIAL HISTORY: Social History   Socioeconomic History   Marital status: Single    Spouse name: n/a   Number of children: 0   Years of education: 12+   Highest education level: Not on file  Occupational History   Occupation: Retired  Tobacco Use   Smoking status: Never  Smokeless tobacco: Never  Vaping Use   Vaping status: Never Used  Substance and Sexual Activity   Alcohol use: No    Alcohol/week: 0.0 standard drinks of alcohol   Drug use: No   Sexual activity: Not on file  Other Topics Concern   Not on file  Social History Narrative   Single, lives with mom.    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   Completed some college.   Social Determinants of Health   Financial Resource Strain: Not on file  Food Insecurity: Not on file  Transportation Needs: Not on file  Physical Activity: Not on file  Stress: Not on file  Social Connections: Unknown (09/03/2021)   Received [facility]   Social Network    Social Network: Not on file  Intimate Partner Violence: Unknown (07/26/2021)   Received from [facility]   HITS    Physically  Hurt: Not on file    Insult or Talk Down To: Not on file    Threaten Physical Harm: Not on file    Scream or Curse: Not on file      PHYSICAL EXAM  There were no vitals filed for this visit.    There is no height or weight on file to calculate BMI.  Generalized: Well developed, in no acute distress  Cardiology: normal rate and rhythm, no murmur noted Respiratory: clear to auscultation bilaterally  Neurological examination  Mentation: Alert oriented to time, place, history taking. Follows all commands speech and language fluent Cranial nerve II-XII: Pupils were equal round reactive to light. Extraocular movements were full, visual field were full  Motor: The motor testing reveals 5 over 5 strength of all 4 extremities. Good symmetric motor tone is noted throughout.  Gait and station: Gait is normal.   DIAGNOSTIC DATA (LABS, IMAGING, TESTING) - I reviewed patient records, labs, notes, testing and imaging myself where available.      No data to display           Lab Results  Component Value Date   WBC 8.5 08/21/2021   HGB 12.6 08/21/2021   HCT 39.2 08/21/2021   MCV 81.6 08/21/2021   PLT 280.0 08/21/2021      Component Value Date/Time   NA 139 08/21/2021 1103   NA 142 07/01/2018 1412   K 3.7 08/21/2021 1103   CL 103 08/21/2021 1103   CO2 29 08/21/2021 1103   GLUCOSE 248 (H) 08/21/2021 1103   BUN 14 08/21/2021 1103   BUN 10 07/01/2018 1412   CREATININE 1.25 (H) 08/21/2021 1103   CREATININE 0.99 12/15/2019 1056   CALCIUM 8.6 08/21/2021 1103   PROT 6.4 08/21/2021 1103   PROT 6.7 07/01/2018 1412   ALBUMIN 3.6 08/21/2021 1103   ALBUMIN 4.2 07/01/2018 1412   AST 11 08/21/2021 1103   ALT 17 08/21/2021 1103   ALKPHOS 90 08/21/2021 1103   BILITOT 0.3 08/21/2021 1103   BILITOT 0.3 07/01/2018 1412   GFRNONAA 69 12/15/2019 1056   GFRAA 80 12/15/2019 1056   Lab Results  Component Value Date   CHOL 168 08/21/2021   HDL 46.30 08/21/2021   LDLCALC 96 08/21/2021    TRIG 127.0 08/21/2021   CHOLHDL 4 08/21/2021   Lab Results  Component Value Date   HGBA1C 12.2 03/04/2022   Lab Results  Component Value Date   VITAMINB12 405 08/21/2021   Lab Results  Component Value Date  TSH 2.10 08/21/2021     ASSESSMENT AND PLAN 47 y.o. year old female  has a past medical history of Allergic rhinitis, cause unspecified, Arthritis, Asthma, Diabetes mellitus type II (08/2009 dx), Endometriosis, Hyperlipemia, Hypertension, Knee pain, right, Lactose intolerance, Morbid obesity (HCC), Osteoarthritis, Ovarian cyst, Sleep apnea, and Stroke (HCC) (2019). here with   No diagnosis found.   Mikaia is doing very well with CPAP therapy.  Compliance report reveals excellent compliance.  She was encouraged to continue using CPAP nightly and for greater than 4 hours each night.   Healthy lifestyle habits encouraged.  She will continue to follow-up with primary care and endocrinology as directed for management of comorbidities and stroke prevention. She was advised to call Dr Corliss Skains to discuss need for repeat imaging. CT ordered in 03/2020 but not completed by patient. She will follow up with [name] in 1 year, sooner if needed.  She verbalizes understanding and agreement with this plan.   No orders of the defined types were placed in this encounter.     No orders of the defined types were placed in this encounter.       Shawnie Dapper, FNP-C 12/17/2022, 1:03 PM [facility] [address], [address] [place], [place] [address] [phone]

## 2022-12-18 ENCOUNTER — Ambulatory Visit: Payer: Self-pay | Admitting: Family Medicine

## 2022-12-18 DIAGNOSIS — G4733 Obstructive sleep apnea (adult) (pediatric): Secondary | ICD-10-CM

## 2023-01-22 ENCOUNTER — Encounter: Payer: Self-pay | Admitting: Neurology

## 2023-01-22 ENCOUNTER — Ambulatory Visit: Payer: No Typology Code available for payment source | Admitting: Neurology

## 2023-01-22 VITALS — BP 190/92 | HR 64 | Ht 68.0 in | Wt 347.0 lb

## 2023-01-22 DIAGNOSIS — E1110 Type 2 diabetes mellitus with ketoacidosis without coma: Secondary | ICD-10-CM | POA: Insufficient documentation

## 2023-01-22 DIAGNOSIS — I6603 Occlusion and stenosis of bilateral middle cerebral arteries: Secondary | ICD-10-CM | POA: Diagnosis not present

## 2023-01-22 DIAGNOSIS — Z6841 Body Mass Index (BMI) 40.0 and over, adult: Secondary | ICD-10-CM

## 2023-01-22 DIAGNOSIS — G459 Transient cerebral ischemic attack, unspecified: Secondary | ICD-10-CM

## 2023-01-22 DIAGNOSIS — I10 Essential (primary) hypertension: Secondary | ICD-10-CM

## 2023-01-22 DIAGNOSIS — E66813 Obesity, class 3: Secondary | ICD-10-CM | POA: Diagnosis not present

## 2023-01-22 DIAGNOSIS — G4733 Obstructive sleep apnea (adult) (pediatric): Secondary | ICD-10-CM | POA: Diagnosis not present

## 2023-01-22 DIAGNOSIS — Z794 Long term (current) use of insulin: Secondary | ICD-10-CM

## 2023-01-22 NOTE — Progress Notes (Signed)
Provider:  Melvyn Novas, MD SLEEP MEDICINE  Primary Care Physician:  Myrlene Broker, MD [address] [place] [address]     Referring Provider: Dr June Leap and Marvis Moeller , NP, of the [facility] stroke service.          Chief Complaint according to patient   Patient presents with:     Yearly Rv with Np/MD for OSA.  Primary neurology provider was Stroke MD            HISTORY OF PRESENT ILLNESS:  Debra [name] is a 47 y.o. female  stroke patient who is here for revisit 01/22/2023 for  OSA follow up on CPAP .  AAfemale with history of HTN, type 2 DM, morbid obesity, endometriosis/ovarian cyst s/p hysterectomy/SBO and seasonal allergies admitted on 06/30/17 for slurred speech and expressive aphasia.  CT head showed small cortical hypoattenuation within the left parietal occipital junction possible acute infarct. CTA head and neck on admission multiple anterior and posterior stenosis, and MRI showed a cluster of acute infarct posterior left temporal lobe and temporoparietal junction region in the left MCA territory.  After admission, patient had fluctuating left MCA syndrome especially with positional changes and BP changes.  Repeat CT showed worsening stable left medial occipital lobe infarct.  Repeat CT head and neck showed unchanged bilateral high-grade M2 stenosis.  CT perfusion with small perfusion deficit, not amendable to intervention.  Repeat MRI showed patchy acute infarct in the left posterior MCA and watershed distributions.  Underwent cerebral angiogram on 07/04/2017, found to have left MCA inferior division >75% stenosis and right M1 80% stenosis.  Was referred by stroke MD  via NP to sleep test and diagnosed with OSA, placed on CPAP.   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   Very high BP today.  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     Download : The patient has been 100% compliant with her CPAP machine over the last 30 days this is compliance by hours and days.  The average user time is 7 hours 39 minutes.  Her AutoSet is an AirSense 10 machine with a serial #[id number].  Settings are between 6 and 12 cm water with 3 cm expiratory pressure relief.   The AHI residual is 0.4/h which is excellent, the 95th percentile pressure is 9 cm water and she has almost no air leakage.  This machine was issued in early 2020 and would be replaced in spring 2025.  I will order a home sleep test for 1 night of CPAP at home so that I have a new baseline before issuing a new prescription we can likely use the same settings in the same mask.  She is using a nasal cradle mas with tubing on top of the head, resmed N30I .        08/18/17 visit JX: During the interval time, the patient has been doing well. Still has mild expressive aphasia but much improve. She still has HH speech and PT. She followed with Dr. Corliss Skains on 08/14/17 and  P2Y12 was low. So her brilinta was decreased in half.  Next visit in 3 months. Continued on ASA 81 and lipitor. She stated that her BP and glucose controlled well at home. Today BP 135/79.    11/17/17 UPDATE: Patient is being seen today for STROKE follow-up and is accompanied by her mother.  She states her speech continues to improve and only has occasional episodes of difficulty getting certain words out with possible stuttering but this occurs more frequently when she is tired, upset or excited.  She does continue to do home speech exercises.  She also has intermittent right arm tingling but this also has become less frequent.  She continues to take both aspirin and Brilinta with bruising but no bleeding.   Continues to take Lipitor but does endorse hip pain that has been new since starting Lipitor but states she did not realize this could be a possible side effect.  Cholesterol levels have not been rechecked since hospital admission.  Blood pressure satisfactory at 143/89.  Patient does continue to monitor this at home.  She also continues to check glucose levels which she states has been improving since her recent adjustment by PCP.  Patient continues to stay active with her church community and attempts to maintain a healthy diet.  She will be having repeat CTA head/neck that was ordered by Dr. Corliss Skains 11/19/2017 for follow-up of stent placement and assessment of right stenosis.  Patient denies new or worsening stroke/TIA symptoms.   05/20/2018 update: Debra [name] is being seen today for scheduled follow-up visit.  She has been stable from a stroke standpoint with only intermittent hesitant speech only during anxiety provoking situations but otherwise denies any other difficulty.  She did have follow-up appointment Dr. Corliss Skains with repeat imaging on 11/19/2017 which showed progression of severe distal right M1 stenosis from prior imaging but did show left M1 and inferior division M2 stent widely patent and she has continued on aspirin and Brilinta with side effects of easy bruising but denies bleeding.  She plans on returning for repeat imaging on 05/27/18. Lipid panel obtained after prior appointment with LDL 47 therefore atorvastatin dose decreased from 80 mg to 40 mg due to reported statin side effects.  She does endorse improvement of her myalgias and continues on atorvastatin 40mg .  Blood pressure today satisfactory at 130/87.  Continues to monitor glucose levels with recent A1c showing improvement from prior A1c's at 9.2.  Denies new or worsening stroke/TIA symptoms.    11/23/2018 update: Debra [name] is a 47 year old female who is being seen today for stroke follow-up.  Residual deficits of intermittent speech  hesitancy which is worsened with anxiety but overall recovered well from a stroke standpoint.  She did have follow-up visit with vascular surgery on 06/26/2018 with left MCA stent widely patent and right MCA stenosis stable.  Recommended continuation of aspirin/Brilinta and follow-up CTA in 6 months time.  She denies bleeding and only mild bruising with ongoing use of aspirin and Brilinta.  Continues on Crestor 40 mg daily without myalgias.  Recent lipid panel by PCP on 10/09/2018 showed LDL 37.  Blood pressure stable at 133/84.  Recent A1c 7.9 on 10/09/2018.  She continues to follow a healthy weight and wellness who has been helping decrease medications and improvement of stroke factors.  Per patient, once HTN, HLD and DM are stable, they will start to address weight loss.   She does endorse insomnia and excessive daytime fatigue.  Denies snoring or morning headaches.  No further concerns at this time.        Patient Instructions by Lavena Bullion, NP at 11/23/2018 12:45 PM    Instructions Ambulatory referral to Sleep Studies  2020 by NP McCue of the stroke service.     Cerebrovascular accident (CVA) due to stenosis of left middle cerebral artery (HCC)  Insomnia, unspecified type  Referral to sleep studies as Stroke MD has signed off.  Received dx of OSA , is now on on CPAP   Morbid obesity (HCC) at BMI 52 .8   Hyperlipidemia, unspecified hyperlipidemia type  Essential hypertension, not well controlled  190/ 92 mmHg   Type 2 diabetes mellitus with other circulatory complication, with long-term current use of insulin (HCC)  Stenosis of both middle cerebral arteries      Review of Systems: Out of a complete 14 system review, the patient complains of only the following symptoms, and all other reviewed systems are negative.:  Fatigue, sleepiness , snoring, fragmented sleep, Insomnia, RLS, Nocturia    How likely are you to doze in the following situations: 0 = not likely, 1 = slight  chance, 2 = moderate chance, 3 = high chance   Sitting and Reading? Watching Television? Sitting inactive in a public place (theater or meeting)? As a passenger in a car for an hour without a break? Lying down in the afternoon when circumstances permit? Sitting and talking to someone? Sitting quietly after lunch without alcohol? In a car, while stopped for a few minutes in traffic?   Total = 4/ 24 points   FSS endorsed at 22/ 63 points.   GDS ; NA   Caregiver burden, feeling overwhelmed.   2020 data : Total = 1/ 24 points   FSS endorsed at 38/ 63 points.   Social History   Socioeconomic History   Marital status: Single    Spouse name: n/a   Number of children: 0   Years of education: 12+   Highest education level: Not on file  Occupational History   Occupation: Retired  Tobacco Use   Smoking status: Never   Smokeless tobacco: Never  Vaping Use   Vaping status: Never Used  Substance and Sexual Activity   Alcohol use: No    Alcohol/week: 0.0 standard drinks of alcohol   Drug use: No   Sexual activity: Not on file  Other Topics Concern   Not on file  Social History Narrative   Single, lives with mom.    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   Completed some college.   Social Determinants of Health   Financial Resource Strain: Not on file  Food Insecurity: Not on file  Transportation Needs: Not on file  Physical Activity: Not on file  Stress: Not on file  Social Connections: Unknown (09/03/2021)   [facility], [facility]   Social Network    Social Network: Not on file    Family History  Problem Relation Age of Onset   Heart disease Mother    Hypertension Mother    Diabetes Mother    Stroke Father 10   Diabetes Father    Hypertension Father    Hyperlipidemia Father    Heart disease Father    Kidney disease Father    Cancer Father    Liver disease  Father    Obesity Father    Colon cancer Neg Hx    Stomach cancer Neg  Hx    Esophageal cancer Neg Hx    Rectal cancer Neg Hx     Past Medical History:  Diagnosis Date   Allergic rhinitis, cause unspecified    Arthritis    Asthma    Diabetes mellitus type II 08/2009 dx   Endometriosis    Hyperlipemia    Hypertension    Knee pain, right    DJD, post traumatic with repeat falls   Lactose intolerance    Morbid obesity (HCC)    s/p lab band 09/2010   Osteoarthritis    Ovarian cyst    right s/p resection June 2013   Sleep apnea    wears cpap   Stroke [facility]) 2019    Past Surgical History:  Procedure Laterality Date   ABDOMINAL HYSTERECTOMY  05/14/12   [place] [facility]   COLONOSCOPY WITH PROPOFOL N/A 01/17/2022   Procedure: COLONOSCOPY WITH PROPOFOL;  Surgeon: Lynann Bologna, MD;  Location: [facility] ENDOSCOPY;  Service: Gastroenterology;  Laterality: N/A;   IR ANGIO INTRA EXTRACRAN SEL COM CAROTID INNOMINATE BILAT MOD SED  07/04/2017   IR ANGIO INTRA EXTRACRAN SEL COM CAROTID INNOMINATE UNI R MOD SED  08/26/2019   IR ANGIO VERTEBRAL SEL VERTEBRAL BILAT MOD SED  07/04/2017   IR ANGIO VERTEBRAL SEL VERTEBRAL BILAT MOD SED  08/26/2019   IR INTRA CRAN STENT  07/07/2017   IR RADIOLOGIST EVAL & MGMT  08/13/2017   IR US GUIDE VASC ACCESS RIGHT  08/26/2019   LAPAROSCOPIC GASTRIC BANDING  10/02/2010   start weight 349#   RADIOLOGY WITH ANESTHESIA N/A 07/07/2017   Procedure: STENTING;  Surgeon: Julieanne Cotton, MD;  Location: [facility] OR;  Service: Radiology;  Laterality: N/A;   SALPINGOOPHORECTOMY  10/08/11   right     Current Outpatient Medications on File Prior to Visit  Medication Sig Dispense Refill   albuterol (PROAIR HFA) 108 (90 Base) MCG/ACT inhaler Inhale 2 puffs into the lungs every 4 (four) hours as needed for wheezing or shortness of breath. 18 g 2   albuterol (PROVENTIL) (2.5 MG/3ML) 0.083% nebulizer solution Take 3 mLs (2.5 mg total) by nebulization every 6 (six) hours as needed for  wheezing or shortness of breath. 75 mL 3   aspirin EC 81 MG tablet Take 81 mg by mouth daily. Swallow whole.     blood glucose meter kit and supplies Dispense based on patient and insurance preference. Use up to four times daily as directed. (FOR ICD-9 250.00, 250.01). 1 each 0   Blood Glucose Monitoring Suppl (ONE TOUCH ULTRA 2) w/Device KIT 1 application  by Does not apply route daily at 4 PM. 1 kit 0   Continuous Blood Gluc Sensor MISC 1 each by Does not apply route as directed. Use as directed every 14 days. May dispense FreeStyle [employer] or similar. 1 each 0   EPINEPHrine 0.3 mg/0.3 mL IJ SOAJ injection Inject 0.3 mLs (0.3 mg total) into the muscle as needed for anaphylaxis. 1 each 1   fexofenadine (ALLEGRA) 180 MG tablet Take 180 mg by mouth daily.      fluticasone (FLONASE) 50 MCG/ACT nasal spray Place 2 sprays into both nostrils daily as needed for allergies. 48 g 3   glimepiride (AMARYL) 4 MG tablet Take 4 mg by mouth daily with breakfast.     glucose blood test strip Used to check blood sugars up to four times daily as needed 200 each 3   ibuprofen (ADVIL) 800 MG tablet Take 800 mg by mouth every  8 (eight) hours as needed for moderate pain.     Insulin Glargine (BASAGLAR KWIKPEN) 100 UNIT/ML Inject 12 Units into the skin daily.     Lancets (ONETOUCH DELICA PLUS LANCET30G) MISC 1 application. by Does not apply route daily. 100 each 11   losartan-hydrochlorothiazide (HYZAAR) 100-12.5 MG tablet Take 1 tablet by mouth daily. 90 tablet 3   Misc Natural Products (ELDERBERRY IMMUNE COMPLEX PO) Take 2 each by mouth daily.     montelukast (SINGULAIR) 10 MG tablet Take 1 tablet (10 mg total) by mouth at bedtime. (Patient taking differently: Take 10 mg by mouth at bedtime as needed.) 30 tablet 3   Multiple Vitamin (MULTIVITAMIN) tablet Take 1 tablet by mouth daily.     Multiple Vitamins-Minerals (ONE A DAY IMMUNITY DEFENSE PO) Take 1 tablet by mouth daily.     olopatadine (PATANOL) 0.1 %  ophthalmic solution Place 1 drop into both eyes 2 (two) times daily as needed for allergies.      senna (SENOKOT) 8.6 MG TABS tablet Take 1 tablet by mouth daily.     sitaGLIPtin (JANUVIA) 100 MG tablet Take 1 tablet (100 mg total) by mouth daily. 90 tablet 3   ticagrelor (BRILINTA) 90 MG TABS tablet Take 0.5 tablets (45 mg total) by mouth 2 (two) times daily. 30 tablet 11   Vitamin D, Ergocalciferol, (DRISDOL) 1.25 MG (50000 UNIT) CAPS capsule Take 1 capsule (50,000 Units total) by mouth every 7 (seven) days. 12 capsule 0   No current facility-administered medications on file prior to visit.    Allergies  Allergen Reactions   Nutritional Supplements Anaphylaxis and Rash    NUT allergy   Dust Mite Extract     Respiratory problems    Metformin And Related Diarrhea   Morphine Hives   Augmentin [Amoxicillin-Pot Clavulanate] Rash    To bilat post hands only   Influenza Vaccines Itching and Rash     DIAGNOSTIC DATA (LABS, IMAGING, TESTING) - I reviewed patient records, labs, notes, testing and imaging myself where available.  Lab Results  Component Value Date   WBC 8.5 08/21/2021   HGB 12.6 08/21/2021   HCT 39.2 08/21/2021   MCV 81.6 08/21/2021   PLT 280.0 08/21/2021      Component Value Date/Time   NA 139 08/21/2021 1103   NA 142 07/01/2018 1412   K 3.7 08/21/2021 1103   CL 103 08/21/2021 1103   CO2 29 08/21/2021 1103   GLUCOSE 248 (H) 08/21/2021 1103   BUN 14 08/21/2021 1103   BUN 10 07/01/2018 1412   CREATININE 1.25 (H) 08/21/2021 1103   CREATININE 0.99 12/15/2019 1056   CALCIUM 8.6 08/21/2021 1103   PROT 6.4 08/21/2021 1103   PROT 6.7 07/01/2018 1412   ALBUMIN 3.6 08/21/2021 1103   ALBUMIN 4.2 07/01/2018 1412   AST 11 08/21/2021 1103   ALT 17 08/21/2021 1103   ALKPHOS 90 08/21/2021 1103   BILITOT 0.3 08/21/2021 1103   BILITOT 0.3 07/01/2018 1412   GFRNONAA 69 12/15/2019 1056   GFRAA 80 12/15/2019 1056   Lab Results  Component Value Date   CHOL 168 08/21/2021    HDL 46.30 08/21/2021   LDLCALC 96 08/21/2021   TRIG 127.0 08/21/2021   CHOLHDL 4 08/21/2021   Lab Results  Component Value Date   HGBA1C 12.2 03/04/2022   Lab Results  Component Value Date   VITAMINB12 405 08/21/2021   Lab Results  Component Value Date   TSH 2.10 08/21/2021    PHYSICAL  EXAM:  There were no vitals filed for this visit. There is no height or weight on file to calculate BMI.   Wt Readings from Last 3 Encounters:  12/03/22 (!) 347 lb (157.4 kg)  07/13/22 (!) 336 lb 3.2 oz (152.5 kg)  03/04/22 (!) 336 lb 2 oz (152.5 kg)     Ht Readings from Last 3 Encounters:  12/03/22 5\' 8"  (1.727 m)  07/13/22 5\' 8"  (1.727 m)  03/04/22 5\' 8"  (1.727 m)      General:  The patient is awake, alert and appears not in acute distress. She is very friendly and talkative. The patient is well groomed. Head: Normocephalic, atraumatic. Neck is supple. Mallampati 3-   neck circumference:16.5 inches .  Nasal airflow patent.  Retrognathia is mildly seen.  Dental status: intact  Cardiovascular:  Regular rate and cardiac rhythm by pulse,  without distended neck veins. Respiratory: Lungs are clear to auscultation.  Skin:  Without evidence of ankle edema, or rash. Trunk: The patient's posture is erect.   Neurologic exam : The patient is awake and alert, oriented to place and time.   Memory subjective described as intact.  Attention span & concentration ability appears normal.  Speech is fluent, without dysarthria, dysphonia or aphasia.  Mood and affect are appropriate.   Cranial nerves: no loss of smell or taste reported  Pupils are equal and briskly reactive to light. Funduscopic exam deferred.  Extraocular movements in vertical and horizontal planes were intact and without nystagmus.  No Diplopia. Visual fields by finger perimetry are intact. Hearing was intact to soft voice and finger rubbing.    Facial sensation intact to fine touch.  Facial motor strength is symmetric and  tongue and uvula move midline.  Neck ROM : rotation, tilt and flexion extension were normal for age and shoulder shrug was symmetrical.    Motor exam:  Symmetric bulk, tone and ROM.   Normal tone without cog- wheeling, symmetric grip strength .Proprioception tested in the upper extremities was normal.   Sensory:  Fine touch and vibration were intact .     Coordination: Rapid alternating movements in the fingers/hands were of normal speed.  The Finger-to-nose maneuver was intact without evidence of ataxia, dysmetria or tremor.   Gait and station: Patient could rise unassisted from a seated position, walked without assistive device.  She reports sometimes limp in the right leg when she feels fatigued.     ASSESSMENT AND PLAN 47 y.o. year old female  here with:    1) History of STROKE in 2019- referral by stroke service to sleep testing in 2020  2) Needing a new machine in early 2020, ordered a HST for baseline .   3) BP was uncontrolled today, repeated  testing showed severe systolic BP . Contribution through caregiver burden , stressors.   I plan to follow up either personally or through our NP within 4 months.   I would like to thank Dr Pearlean Brownie, MD and Myrlene Broker, MD and  Stroke team with Dr .Pearlean Brownie for allowing me to meet with and to take care of this pleasant patient.    The patient reports she has a new insurance that will not cover her PCP and endocrinologist, but can stay with [facility] for services.   After spending a total time of  40  minutes face to face and additional time for physical and neurologic examination, review of laboratory studies,  personal review of imaging studies, reports and results of other testing  and review of referral information / records as far as provided in visit,   Electronically signed by: Melvyn Novas, MD 01/22/2023 12:40 PM  [facility] Board certified by The [employer] of Sleep Medicine and  Diplomate of the [employer] of Sleep Medicine. Board certified In Neurology through the ABPN, Fellow of the [employer] of Neu[employer].

## 2023-01-22 NOTE — Patient Instructions (Addendum)
47 y.o. year old female stroke patient of Shanda Bumps [facility]/ Janalyn Shy Sethi's  here with:     1) History of STROKE in 2019- referral by stroke service to sleep testing in 2020   2) Needing a new machine in early 2020, ordered a HST for baseline.    3) BP was uncontrolled today, repeated  testing showed severe systolic BP . Contribution through caregiver burden , stressors.    I plan to follow up either personally or through our NP within 4-5 months.    I would like to thank Myrlene Broker, MD and Stroke team with Dr .Pearlean Brownie for allowing me to meet with and to take care of this pleasant patient. She is due for a new machine .  The patient reports she has a new insurance that will not cover her PCP and endocrinologist, but can stay with [facility] for services.   Melvyn Novas, MD

## 2023-01-23 ENCOUNTER — Telehealth: Payer: Self-pay | Admitting: Neurology

## 2023-01-23 NOTE — Telephone Encounter (Signed)
HST [facility] [employer] pending faxed notes

## 2023-02-05 NOTE — Telephone Encounter (Signed)
HST [facility] [employer] Berkley Harvey: [id number] (exp. 01/23/23 to 04/01/23)

## 2023-02-18 NOTE — Telephone Encounter (Signed)
HST - [facility] [employer] Berkley Harvey: [id number] (exp. 01/23/23 to 04/01/23)   Patient is scheduled at [facility] for 03/05/23 at 11:30 AM.  Mailed packet to the patient.

## 2023-03-05 ENCOUNTER — Ambulatory Visit: Payer: No Typology Code available for payment source | Admitting: Neurology

## 2023-03-05 DIAGNOSIS — G459 Transient cerebral ischemic attack, unspecified: Secondary | ICD-10-CM

## 2023-03-05 DIAGNOSIS — G4719 Other hypersomnia: Secondary | ICD-10-CM

## 2023-03-05 DIAGNOSIS — Z8673 Personal history of transient ischemic attack (TIA), and cerebral infarction without residual deficits: Secondary | ICD-10-CM

## 2023-03-05 DIAGNOSIS — G4733 Obstructive sleep apnea (adult) (pediatric): Secondary | ICD-10-CM | POA: Diagnosis not present

## 2023-03-05 DIAGNOSIS — I6603 Occlusion and stenosis of bilateral middle cerebral arteries: Secondary | ICD-10-CM

## 2023-03-06 NOTE — Progress Notes (Signed)
[facility]  Debra [name] 47 year old female 01/03/1976   HOME SLEEP TEST REPORT ( by Watch PAT)   STUDY DATA from: 03-06-2023    ORDERING CLINICIAN: Melvyn Novas, MD  REFERRING CLINICIAN:  Dr June Leap and NP,   PCP : Dr Okey Dupre    CLINICAL INFORMATION/patient followed for stroke by Dr Pearlean Brownie- and OSA on CPAP at [facility] sleep/ [facility] by Shawnie Dapper, NP .  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  The average user time is 7 hours 39 minutes.  Her AutoSet is an AirSense 10 machine with a serial #[id number].  Settings are between 6 and 12 cm water with 3 cm expiratory pressure relief.   The AHI residual is 0.4/h which is excellent, the 95th percentile pressure is 9 cm water and she has almost no air leakage.  This machine was issued in early 2020 and would be replaced in spring 2025. BP was uncontrolled today, repeated testing showed severely elevated systolic BP . Contribution through caregiver burden , stressors.   HISTORY: Debra [name] is a 47 y.o. female former stroke patient who is here for revisit 01/22/2023 for OSA follow up on CPAP .  AAfemale with history of HTN, type 2 DM, morbid obesity, endometriosis/ovarian cyst s/p hysterectomy/SBO and seasonal allergies admitted on 06/30/17 for slurred speech and expressive aphasia.  CT head showed small cortical hypoattenuation within the left parietal occipital junction possible acute infarct. CTA head and neck on admission multiple anterior and posterior stenosis, and MRI showed a cluster of acute infarct posterior left temporal lobe and temporoparietal junction region in the left MCA territory.       Epworth sleepiness score: 4/ 24 points   FSS endorsed at  22/ 63 points.    BMI:52.8  kg/m   Neck Circumference: 16"   FINDINGS:   Sleep Summary:   Total Recording Time (hours, min):     7 hours 25 minutes   Total Sleep Time (hours, min):   6 hours 21 minutes              Percent REM (%):     24%                                   Respiratory Indices:   Calculated pAHI (per hour): Following AASM criteria , the AHI was  8.8/h without central events.                         REM pAHI:      17.8/h                                           NREM pAHI:    5.7/h                          Supine AHI:   The patient slept 354 minutes in supine associated with an AHI  of 9.2/h left and right sided sleep were seen for less than 30 minutes and associated with an AHI of 2.8/h.  Snoring showed a mean volume of 41 dB was present for about 14% of total sleep time                                                Oxygen Saturation Statistics:    O2 Saturation Range (%):    Between the nadir at 80% with a maximum saturation of 99% with a mean saturation of 94%                                   O2 Saturation (minutes) <89%:     0.1-minute      Pulse Rate Statistics:   Pulse Mean (bpm):   57 bpm              Pulse Range:     Between 47 and 98 bpm.            IMPRESSION:  This HST confirms the presence of mild and all obstructive sleep apnea.  No central events were recorded   RECOMMENDATION: Given the patient's history of stroke at a very young age I would like for her to continue using CPAP also because she is prone to hypoventilation related to her body mass index. Her replacement machine will be an AutoSet ResMed device with heated humidification and a nasal cradle mask the ResMed ResMed N30 I.  The settings will be between 6 and 13 cm water pressure with 3 cm expiratory pressure relief.     INTERPRETING PHYSICIAN:   Melvyn Novas, MD  [facility] Sleep Board certified by The [employer] of Sleep Medicine and  Diplomate of the [employer] of Sleep Medicine. Board certified In Neurology through the ABPN, Fellow of the [employer] of Neu[employer].

## 2023-03-09 NOTE — Procedures (Signed)
[facility]  Debra [name] 47 year old female 01/03/1976   HOME SLEEP TEST REPORT ( by Watch PAT)   STUDY DATA from: 03-06-2023    ORDERING CLINICIAN: Melvyn Novas, MD  REFERRING CLINICIAN:  Dr June Leap and NP,   PCP : Dr Okey Dupre    CLINICAL INFORMATION/patient followed for stroke by Dr Pearlean Brownie- and OSA on CPAP at [facility]/ [facility] by Shawnie Dapper, NP .  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  The average user time is 7 hours 39 minutes.  Her AutoSet is an AirSense 10 machine with a serial #[id number].  Settings are between 6 and 12 cm water with 3 cm expiratory pressure relief.   The AHI residual is 0.4/h which is excellent, the 95th percentile pressure is 9 cm water and she has almost no air leakage.  This machine was issued in early 2020 and would be replaced in spring 2025. BP was uncontrolled today, repeated testing showed severely elevated systolic BP . Contribution through caregiver burden , stressors.   HISTORY: Debra [name] is a 47 y.o. female former stroke patient who is here for revisit 01/22/2023 for OSA follow up on CPAP .  AAfemale with history of HTN, type 2 DM, morbid obesity, endometriosis/ovarian cyst s/p hysterectomy/SBO and seasonal allergies admitted on 06/30/17 for slurred speech and expressive aphasia.  CT head showed small cortical hypoattenuation within the left parietal occipital junction possible acute infarct. CTA head and neck on admission multiple anterior and posterior stenosis, and MRI showed a cluster of acute infarct posterior left temporal lobe and temporoparietal junction region in the left MCA territory.       Epworth sleepiness score: 4/ 24 points   FSS endorsed at  22/ 63 points.    BMI:52.8  kg/m   Neck Circumference: 16"   FINDINGS:   Sleep Summary:   Total Recording Time (hours, min):     7 hours 25 minutes   Total Sleep Time (hours, min):   6 hours 21 minutes              Percent REM (%):     24%                                   Respiratory Indices:   Calculated pAHI (per hour): Following AASM criteria , the AHI was  8.8/h without central events.                         REM pAHI:      17.8/h                                           NREM pAHI:    5.7/h                          Supine AHI:   The patient slept 354 minutes in supine associated with an AHI  of 9.2/h left and right sided sleep were seen for less than 30 minutes and associated with an AHI of 2.8/h.  Snoring showed a mean volume of 41 dB was present for about 14% of total sleep time                                                Oxygen Saturation Statistics:    O2 Saturation Range (%):    Between the nadir at 80% with a maximum saturation of 99% with a mean saturation of 94%                                   O2 Saturation (minutes) <89%:     0.1-minute      Pulse Rate Statistics:   Pulse Mean (bpm):   57 bpm              Pulse Range:     Between 47 and 98 bpm.            IMPRESSION:  This HST confirms the presence of mild and all obstructive sleep apnea.  No central events were recorded   RECOMMENDATION: Given the patient's history of stroke at a very young age I would like for her to continue using CPAP also because she is prone to hypoventilation related to her body mass index. Her replacement machine will be an AutoSet ResMed device with heated humidification and a nasal cradle mask the ResMed ResMed N30 I.  The settings will be between 6 and 13 cm water pressure with 3 cm expiratory pressure relief.     INTERPRETING PHYSICIAN:   Melvyn Novas, MD  [facility] Sleep Board certified by The [employer] of Sleep Medicine and  Diplomate of the [employer] Sleep Medicine. Board certified In Neurology through the ABPN, Fellow of the [employer].

## 2023-03-09 NOTE — Addendum Note (Signed)
Addended by: Melvyn Novas on: 03/09/2023 06:47 PM   Modules accepted: Orders

## 2023-03-10 ENCOUNTER — Telehealth: Payer: Self-pay

## 2023-03-10 NOTE — Telephone Encounter (Signed)
Called patient to informed her of the new CPAP machine order with nasal mask. Pt verbalized understanding. Pt had no questions at this time but was encouraged to call back if questions arise.

## 2023-03-10 NOTE — Telephone Encounter (Signed)
-----   Message from [facility] sent at 03/09/2023  6:47 PM EST ----- Her replacement machine will be an AutoSet ResMed device with heated humidification and a nasal cradle mask the ResMed ResMed N30 I.  The settings will be between 6 and 13 cm water pressure with 3 cm expiratory pressure relief.

## 2023-03-28 ENCOUNTER — Other Ambulatory Visit: Payer: Self-pay

## 2023-03-28 ENCOUNTER — Telehealth: Payer: Self-pay | Admitting: Internal Medicine

## 2023-03-28 MED ORDER — IBUPROFEN 800 MG PO TABS: 800.0000 mg | ORAL_TABLET | Freq: Three times a day (TID) | ORAL | 0 refills | Status: DC | PRN

## 2023-03-28 NOTE — Telephone Encounter (Signed)
Prescription Request  03/28/2023  LOV: 12/03/2022  What is the name of the medication or equipment?  ibuprofen (ADVIL) 800 MG tablet   Have you contacted your pharmacy to request a refill? No   Which pharmacy would you like this sent to?  [facility][id number] G[place]ette Otto, [place] - [address] AT [facility] [address] [place] [address] Phone: [phone] Fax: [phone]    Patient notified that their request is being sent to the clinical staff for review and that they should receive a response within 2 business days.   Please advise at Mobile [phone] (mobile)

## 2023-04-28 ENCOUNTER — Encounter: Payer: Self-pay | Admitting: Internal Medicine

## 2023-04-28 ENCOUNTER — Ambulatory Visit (INDEPENDENT_AMBULATORY_CARE_PROVIDER_SITE_OTHER): Payer: Medicaid Other | Admitting: Internal Medicine

## 2023-04-28 VITALS — BP 140/86 | HR 77 | Temp 98.9°F | Ht 68.0 in | Wt 344.0 lb

## 2023-04-28 DIAGNOSIS — Z9884 Bariatric surgery status: Secondary | ICD-10-CM

## 2023-04-28 DIAGNOSIS — E118 Type 2 diabetes mellitus with unspecified complications: Secondary | ICD-10-CM

## 2023-04-28 DIAGNOSIS — Z6841 Body Mass Index (BMI) 40.0 and over, adult: Secondary | ICD-10-CM

## 2023-04-28 DIAGNOSIS — Z7985 Long-term (current) use of injectable non-insulin antidiabetic drugs: Secondary | ICD-10-CM | POA: Diagnosis not present

## 2023-04-28 DIAGNOSIS — M150 Primary generalized (osteo)arthritis: Secondary | ICD-10-CM

## 2023-04-28 DIAGNOSIS — I10 Essential (primary) hypertension: Secondary | ICD-10-CM

## 2023-04-28 LAB — POCT GLYCOSYLATED HEMOGLOBIN (HGB A1C): HbA1c POC (<> result, manual entry): 10.4 % (ref 4.0–5.6)

## 2023-04-28 MED ORDER — TIRZEPATIDE 2.5 MG/0.5ML ~~LOC~~ SOAJ: 2.5000 mg | SUBCUTANEOUS | 1 refills | Status: DC

## 2023-04-28 MED ORDER — FLUCONAZOLE 150 MG PO TABS: 150.0000 mg | ORAL_TABLET | ORAL | 0 refills | Status: DC

## 2023-04-28 NOTE — Patient Instructions (Addendum)
 Once you start the mounjaro  shots stop taking the januvia  (sitagliptin ).   Keep taking amaryl (glimepiride).   We have sent in the diflucan  to use 1 pill today then a second in 3 days if needed.   We will have them call you to schedule for eyes and with pharmacist.

## 2023-04-28 NOTE — Progress Notes (Signed)
   Subjective:   Patient ID: Debra [name], female    DOB: December 12, 1975, 48 y.o.   MRN: [id number]  HPI The patient is a 48 YO female coming in for medical management see A/P for details. Did not take BP meds today yet.  Review of Systems  Constitutional:  Positive for unexpected weight change.  HENT: Negative.    Eyes: Negative.   Respiratory:  Negative for cough, chest tightness and shortness of breath.   Cardiovascular:  Negative for chest pain, palpitations and leg swelling.  Gastrointestinal:  Negative for abdominal distention, abdominal pain, constipation, diarrhea, nausea and vomiting.  Musculoskeletal:  Positive for arthralgias and gait problem.  Skin: Negative.   Psychiatric/Behavioral: Negative.      Objective:  Physical Exam Constitutional:      Appearance: She is well-developed. She is obese.  HENT:     Head: Normocephalic and atraumatic.  Cardiovascular:     Rate and Rhythm: Normal rate and regular rhythm.  Pulmonary:     Effort: Pulmonary effort is normal. No respiratory distress.     Breath sounds: Normal breath sounds. No wheezing or rales.  Abdominal:     General: Bowel sounds are normal. There is no distension.     Palpations: Abdomen is soft.     Tenderness: There is no abdominal tenderness. There is no rebound.  Musculoskeletal:        General: Tenderness present.     Cervical back: Normal range of motion.  Skin:    General: Skin is warm and dry.  Neurological:     Mental Status: She is alert and oriented to person, place, and time.     Coordination: Coordination normal.     Vitals:   04/28/23 1526 04/28/23 1530  BP: (!) 140/86 (!) 140/86  Pulse: 77   Temp: 98.9 F (37.2 C)   TempSrc: Oral   SpO2: 98%   Weight: (!) 344 lb (156 kg)   Height: 5' 8 (1.727 m)     Assessment & Plan:

## 2023-04-29 ENCOUNTER — Other Ambulatory Visit (HOSPITAL_COMMUNITY): Payer: Self-pay

## 2023-04-29 ENCOUNTER — Telehealth: Payer: Self-pay | Admitting: Pharmacy Technician

## 2023-04-29 NOTE — Assessment & Plan Note (Signed)
 She is using ibuprofen high dose currently but if BP continues to be high we will be unable to refill. Her BMI is currently too elevated to be a candidate for knee replacement. She understands and wants to work on her health this year.

## 2023-04-29 NOTE — Assessment & Plan Note (Signed)
 BP elevated and with her prior stroke we discussed that makes her high risk for another stroke. She has not taken meds yet today. She does have normal BP with meds. Encouraged her to take on time and set alarm if needed. Continue losartan /hydrochlorothiazide  100/12.5 (she reports only taking 1/2 pill daily as taking 1 pill makes her lightheaded). At next visit if BP at goal we can adjust dose of medicine to take 1 pill daily (likely losartan /hydrochlorothiazide  50/12.5).

## 2023-04-29 NOTE — Telephone Encounter (Signed)
 Pharmacy Patient Advocate Encounter   Received notification from CoverMyMeds that prior authorization for Mounjaro  2.5MG /0.5ML auto-injectors is required/requested.   Insurance verification completed.   The patient is insured through [facility] .   Per test claim: PA required; PA submitted to above mentioned insurance via CoverMyMeds Key/confirmation #/EOC Key: [id number] Status is pending

## 2023-04-29 NOTE — Assessment & Plan Note (Signed)
 Weight is up and she stopped insulin on her own due to weight gain and it not making her feel good. Rx mounjaro 2.5 mg weekly as she did get weight loss with this. Follow up 3 months.

## 2023-04-29 NOTE — Assessment & Plan Note (Signed)
 POC HgA1c done and uncontrolled at 10.4 today. She is off her insulin  and unable to see endo due to insurance change. She is taking amaryl 4 mg daily and januvia . We will start mounjaro  2.5 mg weekly and stop januvia . Continue amaryl 4 mg daily. Follow up 3 months. Discussed that the longer her sugars are uncontrolled she is higher risk for serious complications. 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

## 2023-04-29 NOTE — Assessment & Plan Note (Signed)
 She is thinking about getting another weight loss surgery as she is unable to have knee replacement due to weight currently. We discussed conversion and recovery which can be more extended.

## 2023-04-29 NOTE — Telephone Encounter (Signed)
 Pharmacy Patient Advocate Encounter  Received notification from [facility] that Prior Authorization for Mounjaro  2.5MG /0.5ML auto-injectors has been APPROVED from 04/29/2023 to 04/28/2024. Ran test claim, Copay is $4.00. This test claim was processed through [facility]- copay amounts may vary at other pharmacies due to pharmacy/plan contracts, or as the patient moves through the different stages of their insurance plan.   PA #/Case ID/Reference #: [id number] Key: [place]

## 2023-05-02 ENCOUNTER — Telehealth: Payer: Self-pay

## 2023-05-02 NOTE — Progress Notes (Signed)
 Care Guide Pharmacy Note  05/02/2023 Name: Debra [name] MRN: [id number] DOB: 13-Dec-1975  Referred By: Rollene Almarie LABOR, MD Reason for referral: No chief complaint on file.   Debra [name] is a 48 y.o. year old female who is a primary care patient of Rollene Almarie LABOR, MD.  Debra [name] was referred to the pharmacist for assistance related to: DMII  Successful contact was made with the patient to discuss pharmacy services including being ready for the pharmacist to call at least 5 minutes before the scheduled appointment time and to have medication bottles and any blood pressure readings ready for review. The patient agreed to meet with the pharmacist via telephone visit on (date/time).05/15/2023  Jeoffrey Buffalo , RMA     [place]  [facility] Guide  Direct Dial: [phone]  Website: [email or web address]

## 2023-05-15 ENCOUNTER — Other Ambulatory Visit: Payer: Medicaid Other | Admitting: Pharmacist

## 2023-05-15 DIAGNOSIS — E118 Type 2 diabetes mellitus with unspecified complications: Secondary | ICD-10-CM

## 2023-05-15 NOTE — Patient Instructions (Signed)
It was a pleasure speaking with you today!  Please check blood sugars at home to see if they are improving. I'll call back in 1 month to see how you are doing.  Feel free to call with any questions or concerns!  Arbutus Leas, PharmD, BCPS, CPP Clinical Pharmacist Practitioner [facility] [phone]

## 2023-05-15 NOTE — Progress Notes (Signed)
05/15/2023 Name: Debra [name] MRN: [id number] DOB: 09-Feb-1976  Chief Complaint  Patient presents with   Diabetes   Medication Management    Debra [name] is a 48 y.o. year old female who presented for a telephone visit.   They were referred to the pharmacist by their PCP for assistance in managing diabetes.   Subjective:  Care Team: Primary Care Provider: Myrlene Broker, MD ; Next Scheduled Visit: 07/28/23   Medication Access/Adherence  Current Pharmacy:  [facility] #[id number] Ginette Otto, [place] - [address] AT [facility] OF [facility] [address] [place] [address] Phone: [phone] Fax: [phone]   Patient reports affordability concerns with their medications: No  Patient reports access/transportation concerns to their pharmacy: No  Patient reports adherence concerns with their medications:  No     *Pt was at work and was unable to spend much time on the phone call Diabetes:  Current medications: mounjaro 2.5 mg weekly, glimepiride 4 mg daily Medications tried in the past: Ozempic, Rybelsus, Jardiance, glipizide, Tresiba, Basaglar, Lantus, Soliqua, metformin, Trulicity  Current glucose readings: has not checked recently  *She started Dyer 1 week ago, today was her second injection. She notes she had been on it in the past and it worked very well for her but it was changed due to her insurance changing. She notes she previously was unable to tolerate the 5 mg dose. She has a history of lap band surgery. Prevously saw Dr. Talmage Nap for endocrinology.   Objective:  Lab Results  Component Value Date   HGBA1C 10.4 04/28/2023    Lab Results  Component Value Date   CREATININE 1.25 (H) 08/21/2021   BUN 14 08/21/2021   NA 139 08/21/2021   K 3.7 08/21/2021   CL 103 08/21/2021   CO2 29 08/21/2021    Lab Results  Component Value Date   CHOL 168 08/21/2021   HDL 46.30 08/21/2021   LDLCALC 96 08/21/2021   TRIG 127.0  08/21/2021   CHOLHDL 4 08/21/2021    Medications Reviewed Today     Reviewed by Bonita Quin, RPH (Pharmacist) on 05/15/23 at 1544  Med List Status: <None>   Medication Order Taking? Sig Documenting Provider Last Dose Status Informant  albuterol (PROAIR HFA) 108 (90 Base) MCG/ACT inhaler [id number]  Inhale 2 puffs into the lungs every 4 (four) hours as needed for wheezing or shortness of breath. Myrlene Broker, MD  Active   albuterol (PROVENTIL) (2.5 MG/3ML) 0.083% nebulizer solution [id number]  Take 3 mLs (2.5 mg total) by nebulization every 6 (six) hours as needed for wheezing or shortness of breath. Myrlene Broker, MD  Active   aspirin EC 81 MG tablet [id number]  Take 81 mg by mouth daily. Swallow whole. [provider]  Active Self  blood glucose meter kit and supplies [id number]  Dispense based on patient and insurance preference. Use up to four times daily as directed. (FOR ICD-9 250.00, 250.01). Newt Lukes, MD  Active Self  Blood Glucose Monitoring Suppl (ONE TOUCH ULTRA 2) w/Device KIT [id number]  1 application  by Does not apply route daily at 4 PM. Myrlene Broker, MD  Active   Continuous Blood Gluc Sensor MISC [id number]  1 each by Does not apply route as directed. Use as directed every 14 days. May dispense FreeStyle [employer] or similar. Myrlene Broker, MD  Active Self  EPINEPHrine 0.3 mg/0.3 mL IJ SOAJ injection [id number]  Inject 0.3 mLs (0.3 mg total) into the muscle as needed for anaphylaxis. Myrlene Broker, MD  Active Self  fexofenadine [facility]) 180 MG tablet [id number]  Take 180 mg by mouth daily.  [provider]  Active Self  fluconazole (DIFLUCAN) 150 MG tablet [id number]  Take 1 tablet (150 mg total) by mouth every 3 (three) days. Myrlene Broker, MD  Active   fluticasone Aleda Grana) 50 MCG/ACT nasal spray [id number]  Place 2 sprays into both nostrils daily as needed for allergies. Myrlene Broker,  MD  Active   glimepiride (AMARYL) 4 MG tablet [id number] Yes Take 4 mg by mouth daily with breakfast. [provider] Taking Active Self  glucose blood test strip [id number]  Used to check blood sugars up to four times daily as needed Myrlene Broker, MD  Active   ibuprofen (ADVIL) 800 MG tablet [id number]  Take 1 tablet (800 mg total) by mouth every 8 (eight) hours as needed for moderate pain (pain score 4-6). Myrlene Broker, MD  Active   Lancets [facility] [place]) MISC [id number]  1 application. by Does not apply route daily. Myrlene Broker, MD  Active Self  losartan-hydrochlorothiazide [facility]) 100-12.5 MG tablet [id number]  Take 1 tablet by mouth daily. Myrlene Broker, MD  Active   Misc Natural Products [facility] IMMUNE COMPLEX PO) [id number]  Take 2 each by mouth daily. [provider]  Active Self  montelukast (SINGULAIR) 10 MG tablet [id number]  Take 1 tablet (10 mg total) by mouth at bedtime.  Patient taking differently: Take 10 mg by mouth at bedtime as needed.   Myrlene Broker, MD  Active Self  Multiple Vitamin (MULTIVITAMIN) tablet [id number]  Take 1 tablet by mouth daily. [provider]  Active Self  Multiple Vitamins-Minerals (ONE A DAY IMMUNITY DEFENSE PO) [id number]  Take 1 tablet by mouth daily. [provider]  Active Self  olopatadine (PATANOL) 0.1 % ophthalmic solution [id number]  Place 1 drop into both eyes 2 (two) times daily as needed for allergies.  [provider]  Active Self  senna (SENOKOT) 8.6 MG TABS tablet [id number]  Take 1 tablet by mouth daily. [provider]  Active Self  ticagrelor (BRILINTA) 90 MG TABS tablet [id number]  Take 0.5 tablets (45 mg total) by mouth 2 (two) times daily. Myrlene Broker, MD  Active   tirzepatide [facility]) 2.5 MG/0.5ML Pen [id number] Yes Inject 2.5 mg into the skin once a week. Myrlene Broker, MD Taking Active   Vitamin D,  Ergocalciferol, (DRISDOL) 1.25 MG (50000 UNIT) CAPS capsule [id number]  Take 1 capsule (50,000 Units total) by mouth every 7 (seven) days. Myrlene Broker, MD  Active Self              Assessment/Plan:   Diabetes: - Currently uncontrolled, A1c goal <7% - Recommend to continue current regimen.  - Recommend to check glucose at home regularly    Follow Up Plan: 2/24  Arbutus Leas, PharmD, BCPS, CPP Clinical Pharmacist Practitioner [facility] [phone]

## 2023-05-27 ENCOUNTER — Encounter: Payer: Self-pay | Admitting: Family Medicine

## 2023-05-27 ENCOUNTER — Ambulatory Visit: Payer: Medicaid Other | Admitting: Family Medicine

## 2023-05-27 ENCOUNTER — Telehealth: Payer: Self-pay

## 2023-05-27 VITALS — BP 142/82 | HR 77 | Temp 98.7°F | Ht 68.0 in | Wt 336.0 lb

## 2023-05-27 DIAGNOSIS — R051 Acute cough: Secondary | ICD-10-CM

## 2023-05-27 DIAGNOSIS — J0140 Acute pansinusitis, unspecified: Secondary | ICD-10-CM

## 2023-05-27 LAB — POCT INFLUENZA A/B
Influenza A, POC: NEGATIVE
Influenza B, POC: NEGATIVE

## 2023-05-27 LAB — POC COVID19 BINAXNOW: SARS Coronavirus 2 Ag: NEGATIVE

## 2023-05-27 MED ORDER — METHYLPREDNISOLONE ACETATE 40 MG/ML IJ SUSP
40.0000 mg | Freq: Once | INTRAMUSCULAR | Status: AC
  Administered 2023-05-27: 40 mg via INTRAMUSCULAR

## 2023-05-27 MED ORDER — ALBUTEROL SULFATE HFA 108 (90 BASE) MCG/ACT IN AERS: 2.0000 | INHALATION_SPRAY | RESPIRATORY_TRACT | 2 refills | Status: AC | PRN

## 2023-05-27 MED ORDER — METHYLPREDNISOLONE ACETATE 80 MG/ML IJ SUSP: 80.0000 mg | Freq: Once | INTRAMUSCULAR | Status: DC

## 2023-05-27 MED ORDER — AZITHROMYCIN 250 MG PO TABS: ORAL_TABLET | ORAL | 0 refills | Status: AC

## 2023-05-27 NOTE — Patient Instructions (Signed)
 COVID and flu testing were negative.   I have sent in azithromycin  for you to take.  Take 2 tablets today, then 1 tablet daily for the next 4 days.  You have received a steroid injection in the office today.  Follow-up with me for new or worsening symptoms.

## 2023-05-27 NOTE — Telephone Encounter (Signed)
Pt lvm on sleep lab phone asking about an update on the new machine that was supposed to be ordered for her. She stated she has not heard anything and wanted to check in on it. CB # [id number]

## 2023-05-27 NOTE — Progress Notes (Signed)
 Acute Office Visit  Subjective:     Patient ID: Debra [name], female    DOB: 29-Aug-1975, 48 y.o.   MRN: [id number]  Chief Complaint  Patient presents with   Cough    Productive cough (clear phlegm, headache when coughing), nasal drainage, low-grade fever (99.5-100.5 this morning), shortness of breath, chest congestion. Has past history of sinus issues and has been experiencing symptoms from that within the last week-2 weeks. Treating with ibuprofen  and Symbicort inhaler    HPI Patient is in today for evaluation of cough, headache, nasal congestion, fever, SOB, for the last 2 days. Sinus symptoms for the last 2 weeks as well.  Has tried OTC cough and cold with little relief. Reports that most people at work are sick as well. Denies abdominal pain, nausea, vomiting, diarrhea, rash, other symptoms.  Medical hx as outlined below.  ROS Per HPI      Objective:    BP (!) 142/82   Pulse 77   Temp 98.7 F (37.1 C)   Ht 5' 8 (1.727 m)   Wt (!) 336 lb (152.4 kg)   LMP 07/28/2011   SpO2 99%   BMI 51.09 kg/m    Physical Exam Vitals and nursing note reviewed.  Constitutional:      General: She is not in acute distress.    Comments: Appears fatigued   HENT:     Head: Normocephalic and atraumatic.     Nose: Rhinorrhea present. No congestion.     Mouth/Throat:     Mouth: Mucous membranes are moist.     Pharynx: Oropharynx is clear. No oropharyngeal exudate or posterior oropharyngeal erythema.     Comments: Oropharyngeal cobblestoning   Eyes:     Extraocular Movements: Extraocular movements intact.  Cardiovascular:     Rate and Rhythm: Normal rate and regular rhythm.     Heart sounds: Normal heart sounds.  Pulmonary:     Effort: Pulmonary effort is normal. No respiratory distress.     Breath sounds: Wheezing present. No rhonchi or rales.  Musculoskeletal:        General: Normal range of motion.     Cervical back: Normal range of motion and neck supple.   Lymphadenopathy:     Cervical: Cervical adenopathy present.  Skin:    General: Skin is warm and dry.     Capillary Refill: Capillary refill takes less than 2 seconds.  Neurological:     General: No focal deficit present.     Mental Status: She is alert and oriented to person, place, and time.    Results for orders placed or performed in visit on 05/27/23  POC COVID-19 BinaxNow  Result Value Ref Range   SARS Coronavirus 2 Ag Negative Negative  POCT Influenza A/B  Result Value Ref Range   Influenza A, POC Negative Negative   Influenza B, POC Negative Negative        Assessment & Plan:  1. Acute non-recurrent pansinusitis (Primary)  - methylPREDNISolone  acetate (DEPO-MEDROL ) injection 80 mg - azithromycin  (ZITHROMAX ) 250 MG tablet; Take 2 tablets (500 mg total) by mouth daily for 1 day, THEN 1 tablet (250 mg total) daily for 4 days.  Dispense: 6 each; Refill: 0 - Will treat as bacterial given LOS 14 days, new onset fever  2. Acute cough  - methylPREDNISolone  acetate (DEPO-MEDROL ) injection 80 mg - azithromycin  (ZITHROMAX ) 250 MG tablet; Take 2 tablets (500 mg total) by mouth daily for 1 day, THEN 1 tablet (250 mg total) daily  for 4 days.  Dispense: 6 each; Refill: 0    Meds ordered this encounter  Medications   DISCONTD: methylPREDNISolone  acetate (DEPO-MEDROL ) injection 80 mg   azithromycin  (ZITHROMAX ) 250 MG tablet    Sig: Take 2 tablets (500 mg total) by mouth daily for 1 day, THEN 1 tablet (250 mg total) daily for 4 days.    Dispense:  6 each    Refill:  0   methylPREDNISolone  acetate (DEPO-MEDROL ) injection 40 mg   methylPREDNISolone  acetate (DEPO-MEDROL ) injection 40 mg   albuterol  (PROAIR  HFA) 108 (90 Base) MCG/ACT inhaler    Sig: Inhale 2 puffs into the lungs every 4 (four) hours as needed for wheezing or shortness of breath.    Dispense:  18 g    Refill:  2    Return if symptoms worsen or fail to improve.  Corean Ku, FNP

## 2023-05-27 NOTE — Telephone Encounter (Signed)
Looks like orders would have been sent to [facility] in November. I will reach out to them and see if they received the order and have them contact the patient.

## 2023-06-10 ENCOUNTER — Other Ambulatory Visit: Payer: Self-pay | Admitting: Internal Medicine

## 2023-06-16 ENCOUNTER — Encounter: Payer: Self-pay | Admitting: Internal Medicine

## 2023-06-16 ENCOUNTER — Other Ambulatory Visit (INDEPENDENT_AMBULATORY_CARE_PROVIDER_SITE_OTHER): Payer: Medicaid Other | Admitting: Pharmacist

## 2023-06-16 DIAGNOSIS — E118 Type 2 diabetes mellitus with unspecified complications: Secondary | ICD-10-CM

## 2023-06-16 NOTE — Patient Instructions (Signed)
 It was a pleasure speaking with you today!    Feel free to call with any questions or concerns!  Arbutus Leas, PharmD, BCPS, CPP Clinical Pharmacist Practitioner [facility] [phone]

## 2023-06-16 NOTE — Progress Notes (Signed)
 06/16/2023 Name: Debra [name] MRN: [id number] DOB: Nov 06, 1975  Chief Complaint  Patient presents with   Diabetes   Medication Management    Debra [name] is a 48 y.o. year old female who presented for a telephone visit.   They were referred to the pharmacist by their PCP for assistance in managing diabetes.   Subjective:  Care Team: Primary Care Provider: Myrlene Broker, MD ; Next Scheduled Visit: 07/28/23   Medication Access/Adherence  Current Pharmacy:  [id number] [place], [place] - [address] AT [facility] [address] [place] [address] Phone: [phone] Fax: [phone]   Patient reports affordability concerns with their medications: No  Patient reports access/transportation concerns to their pharmacy: No  Patient reports adherence concerns with their medications:  No     Diabetes:  Current medications: mounjaro 2.5 mg weekly, glimepiride 4 mg daily Medications tried in the past: Ozempic, Rybelsus, Jardiance, glipizide, Tresiba, Basaglar, Lantus, Soliqua, metformin, Trulicity  Current glucose readings: Midday BG around 168, has not checked   *She notes she had been on it in the past and it worked very well for her but it was changed due to her insurance changing. She notes she previously was unable to tolerate the 5 mg dose. She has a history of lap band surgery. Prevously saw Dr. Talmage Nap for endocrinology.   Objective:  Lab Results  Component Value Date   HGBA1C 10.4 04/28/2023    Lab Results  Component Value Date   CREATININE 1.25 (H) 08/21/2021   BUN 14 08/21/2021   NA 139 08/21/2021   K 3.7 08/21/2021   CL 103 08/21/2021   CO2 29 08/21/2021    Lab Results  Component Value Date   CHOL 168 08/21/2021   HDL 46.30 08/21/2021   LDLCALC 96 08/21/2021   TRIG 127.0 08/21/2021   CHOLHDL 4 08/21/2021    Medications Reviewed Today     Reviewed by Bonita Quin, RPH (Pharmacist) on  06/16/23 at 1322  Med List Status: <None>   Medication Order Taking? Sig Documenting Provider Last Dose Status Informant  albuterol (PROAIR HFA) 108 (90 Base) MCG/ACT inhaler [id number]  Inhale 2 puffs into the lungs every 4 (four) hours as needed for wheezing or shortness of breath. Moshe Cipro, FNP  Active   albuterol (PROVENTIL) (2.5 MG/3ML) 0.083% nebulizer solution [id number]  Take 3 mLs (2.5 mg total) by nebulization every 6 (six) hours as needed for wheezing or shortness of breath. Myrlene Broker, MD  Active   aspirin EC 81 MG tablet [id number]  Take 81 mg by mouth daily. Swallow whole. [provider]  Active Self  blood glucose meter kit and supplies [id number]  Dispense based on patient and insurance preference. Use up to four times daily as directed. (FOR ICD-9 250.00, 250.01). Newt Lukes, MD  Active Self  Blood Glucose Monitoring Suppl (ONE TOUCH ULTRA 2) w/Device KIT [id number]  1 application  by Does not apply route daily at 4 PM. Myrlene Broker, MD  Active   Continuous Blood Gluc Sensor MISC [id number]  1 each by Does not apply route as directed. Use as directed every 14 days. May dispense FreeStyle [employer] or similar. Myrlene Broker, MD  Active Self  EPINEPHrine 0.3 mg/0.3 mL IJ SOAJ injection [id number]  Inject 0.3 mLs (0.3 mg total) into the muscle as needed for anaphylaxis. Myrlene Broker, MD  Active Self  fexofenadine Joyce Copa)  180 MG tablet [id number]  Take 180 mg by mouth daily.  [provider]  Active Self  fluconazole (DIFLUCAN) 150 MG tablet [id number]  Take 1 tablet (150 mg total) by mouth every 3 (three) days. Myrlene Broker, MD  Active   fluticasone Aleda Grana) 50 MCG/ACT nasal spray [id number]  Place 2 sprays into both nostrils daily as needed for allergies. Myrlene Broker, MD  Active   glimepiride (AMARYL) 4 MG tablet [id number] Yes Take 4 mg by mouth daily with breakfast. [provider]  Taking Active Self  glucose blood test strip [id number]  Used to check blood sugars up to four times daily as needed Myrlene Broker, MD  Active   ibuprofen (ADVIL) 800 MG tablet [id number]  TAKE 1 TABLET(800 MG) BY MOUTH EVERY 8 HOURS AS NEEDED FOR MODERATE PAIN Myrlene Broker, MD  Active   Lancets [facility] DELICA PLUS [place]) MISC [id number]  1 application. by Does not apply route daily. Myrlene Broker, MD  Active Self  losartan-hydrochlorothiazide [facility]) 100-12.5 MG tablet [id number]  Take 1 tablet by mouth daily. Myrlene Broker, MD  Active   Misc Natural Products [facility] IMMUNE COMPLEX PO) [id number]  Take 2 each by mouth daily. [provider]  Active Self  montelukast (SINGULAIR) 10 MG tablet [id number]  Take 1 tablet (10 mg total) by mouth at bedtime.  Patient taking differently: Take 10 mg by mouth at bedtime as needed.   Myrlene Broker, MD  Active Self  Multiple Vitamin (MULTIVITAMIN) tablet [id number]  Take 1 tablet by mouth daily. [provider]  Active Self  Multiple Vitamins-Minerals (ONE A DAY IMMUNITY DEFENSE PO) [id number]  Take 1 tablet by mouth daily. [provider]  Active Self  olopatadine (PATANOL) 0.1 % ophthalmic solution [id number]  Place 1 drop into both eyes 2 (two) times daily as needed for allergies.  [provider]  Active Self  senna (SENOKOT) 8.6 MG TABS tablet [id number]  Take 1 tablet by mouth daily. [provider]  Active Self  ticagrelor (BRILINTA) 90 MG TABS tablet [id number]  Take 0.5 tablets (45 mg total) by mouth 2 (two) times daily. Myrlene Broker, MD  Active   tirzepatide [facility]) 2.5 MG/0.5ML Pen [id number] Yes Inject 2.5 mg into the skin once a week. Myrlene Broker, MD Taking Active   Vitamin D, Ergocalciferol, (DRISDOL) 1.25 MG (50000 UNIT) CAPS capsule [id number]  Take 1 capsule (50,000 Units total) by mouth every 7 (seven) days. Myrlene Broker, MD  Active  Self              Assessment/Plan:   Diabetes: - Currently uncontrolled, A1c goal <7% - Recommend to continue current regimen.  - Recommend to check glucose at home regularly - Await next A1c, April PCP    Follow Up Plan: PRN  Arbutus Leas, PharmD, BCPS, CPP Clinical Pharmacist Practitioner [facility] [phone]

## 2023-06-20 DIAGNOSIS — G4733 Obstructive sleep apnea (adult) (pediatric): Secondary | ICD-10-CM | POA: Diagnosis not present

## 2023-07-10 ENCOUNTER — Other Ambulatory Visit: Payer: Self-pay | Admitting: Internal Medicine

## 2023-07-18 DIAGNOSIS — G4733 Obstructive sleep apnea (adult) (pediatric): Secondary | ICD-10-CM | POA: Diagnosis not present

## 2023-07-28 ENCOUNTER — Ambulatory Visit: Payer: Medicaid Other | Admitting: Internal Medicine

## 2023-08-18 ENCOUNTER — Ambulatory Visit: Admitting: Internal Medicine

## 2023-08-18 DIAGNOSIS — G4733 Obstructive sleep apnea (adult) (pediatric): Secondary | ICD-10-CM | POA: Diagnosis not present

## 2023-08-27 LAB — HM DIABETES EYE EXAM

## 2023-09-13 ENCOUNTER — Other Ambulatory Visit: Payer: Self-pay | Admitting: Internal Medicine

## 2023-09-17 DIAGNOSIS — G4733 Obstructive sleep apnea (adult) (pediatric): Secondary | ICD-10-CM | POA: Diagnosis not present

## 2023-09-30 ENCOUNTER — Other Ambulatory Visit: Payer: Self-pay

## 2023-09-30 ENCOUNTER — Emergency Department (HOSPITAL_BASED_OUTPATIENT_CLINIC_OR_DEPARTMENT_OTHER)
Admission: EM | Admit: 2023-09-30 | Discharge: 2023-09-30 | Disposition: A | Discharge status: Home / Self Care | Attending: Emergency Medicine | Admitting: Emergency Medicine

## 2023-09-30 ENCOUNTER — Encounter (HOSPITAL_BASED_OUTPATIENT_CLINIC_OR_DEPARTMENT_OTHER): Payer: Self-pay | Admitting: Emergency Medicine

## 2023-09-30 ENCOUNTER — Emergency Department (HOSPITAL_BASED_OUTPATIENT_CLINIC_OR_DEPARTMENT_OTHER)

## 2023-09-30 DIAGNOSIS — E119 Type 2 diabetes mellitus without complications: Secondary | ICD-10-CM | POA: Diagnosis not present

## 2023-09-30 DIAGNOSIS — M79641 Pain in right hand: Secondary | ICD-10-CM | POA: Diagnosis not present

## 2023-09-30 DIAGNOSIS — Z7984 Long term (current) use of oral hypoglycemic drugs: Secondary | ICD-10-CM | POA: Insufficient documentation

## 2023-09-30 DIAGNOSIS — Z7982 Long term (current) use of aspirin: Secondary | ICD-10-CM | POA: Diagnosis not present

## 2023-09-30 DIAGNOSIS — M79644 Pain in right finger(s): Secondary | ICD-10-CM | POA: Diagnosis not present

## 2023-09-30 DIAGNOSIS — W010XXA Fall on same level from slipping, tripping and stumbling without subsequent striking against object, initial encounter: Secondary | ICD-10-CM | POA: Insufficient documentation

## 2023-09-30 MED ORDER — CELECOXIB 200 MG PO CAPS: 200.0000 mg | ORAL_CAPSULE | Freq: Two times a day (BID) | ORAL | 0 refills | Status: DC | PRN

## 2023-09-30 MED ORDER — KETOROLAC TROMETHAMINE 30 MG/ML IJ SOLN
30.0000 mg | Freq: Once | INTRAMUSCULAR | Status: AC
  Administered 2023-09-30: 30 mg via INTRAMUSCULAR
  Filled 2023-09-30: qty 1

## 2023-09-30 NOTE — ED Triage Notes (Addendum)
 Mechanical fall x 2 , 2 weeks ago , right arm injury , worse pain to right thumb .  Reports no pain to right arm .  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

## 2023-09-30 NOTE — Discharge Instructions (Addendum)
 Your x-ray did not show any obvious fracture or dislocation.  Given where you are tender in your hand, there is a concern that you may have a scaphoid fracture.  This kind of fracture does not always show up on the initial x-ray.  Recommend wearing wrist brace until follow-up with hand specialist.  Recommend calling number attached your discharge papers to schedule appointment.  Will send in a different anti-inflammatory to take for pain.  Also recommend icing your wrist.  Please do not hesitate to return to the emergency department if the worrisome signs and symptoms we discussed become apparent.

## 2023-09-30 NOTE — ED Provider Notes (Signed)
 [place] [facility] Provider Note   CSN: [id number] Arrival date & time: 09/30/23  1627     History  Chief Complaint  Patient presents with   Right Thumb Pain    Debra [name] is a 48 y.o. female.  HPI   48 year old female presents emergency department with complaints of right thumb pain.  States she had 2 falls over the past couple of weeks.  The first 1, she tripped on an object and landed on outstretched arms.  The second 1 about a week and a half ago, got her left foot caught on her right pant leg and fell forward bracing her fall with her hands.  She says she did hear her right knee at that time which symptoms have since resolved.  Since the second fall, has had pain in the base of her right thumb.  Pain persistent despite use of over-the-counter medications.  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  States she is right-handed.  Past medical history significant for diabetes mellitus, CVA, hyperlipidemia, OSA  Home Medications Prior to Admission medications   Medication Sig Start Date End Date Taking? Authorizing Provider  albuterol  (PROAIR  HFA) 108 (90 Base) MCG/ACT inhaler Inhale 2 puffs into the lungs every 4 (four) hours as needed for wheezing or shortness of breath. 05/27/23   Wellington Half, FNP  albuterol  (PROVENTIL ) (2.5 MG/3ML) 0.083% nebulizer solution Take 3 mLs (2.5 mg total) by nebulization every 6 (six) hours as needed for wheezing or shortness of breath. 12/03/22   Adelia Homestead, MD  aspirin  EC 81 MG tablet Take 81 mg by mouth daily. Swallow whole.    [provider]  blood glucose meter kit and supplies Dispense based on patient and insurance preference. Use up to four times daily as directed. (FOR ICD-9 250.00, 250.01). 06/03/14   Carolene Chute, MD  Blood Glucose Monitoring Suppl (ONE TOUCH ULTRA 2) w/Device KIT 1 application  by  Does not apply route daily at 4 PM. 02/28/22   Adelia Homestead, MD  Continuous Blood Gluc Sensor MISC 1 each by Does not apply route as directed. Use as directed every 14 days. May dispense FreeStyle [employer] or similar. 08/06/21   Adelia Homestead, MD  EPINEPHrine  0.3 mg/0.3 mL IJ SOAJ injection Inject 0.3 mLs (0.3 mg total) into the muscle as needed for anaphylaxis. 03/02/19   Adelia Homestead, MD  fexofenadine (ALLEGRA) 180 MG tablet Take 180 mg by mouth daily.     [provider]  fluconazole  (DIFLUCAN ) 150 MG tablet Take 1 tablet (150 mg total) by mouth every 3 (three) days. 04/28/23   Adelia Homestead, MD  fluticasone  (FLONASE ) 50 MCG/ACT nasal spray Place 2 sprays into both nostrils daily as needed for allergies. 12/03/22   Adelia Homestead, MD  glimepiride (AMARYL) 4 MG tablet Take 4 mg by mouth daily with breakfast. 05/31/21   [provider]  glucose blood test strip Used to check blood sugars up to four times daily as needed 04/01/22   Adelia Homestead, MD  ibuprofen  (ADVIL ) 800 MG tablet TAKE 1 TABLET(800 MG) BY MOUTH EVERY 8 HOURS AS NEEDED FOR MODERATE PAIN 09/17/23   Adelia Homestead, MD  Lancets [facility] DELICA PLUS [place]) MISC 1 application. by Does not apply route daily. 07/25/21   Adelia Homestead, MD  losartan -hydrochlorothiazide  (HYZAAR) 100-12.5  MG tablet Take 1 tablet by mouth daily. 12/03/22   Adelia Homestead, MD  Misc Natural Products (ELDERBERRY IMMUNE COMPLEX PO) Take 2 each by mouth daily.    [provider]  montelukast  (SINGULAIR ) 10 MG tablet Take 1 tablet (10 mg total) by mouth at bedtime. Patient taking differently: Take 10 mg by mouth at bedtime as needed. 03/01/19   Adelia Homestead, MD  Multiple Vitamin (MULTIVITAMIN) tablet Take 1 tablet by mouth daily.    [provider]  Multiple Vitamins-Minerals (ONE A DAY IMMUNITY DEFENSE PO) Take 1 tablet by mouth daily.    [provider]  olopatadine  (PATANOL) 0.1 % ophthalmic solution Place 1 drop into both eyes 2 (two) times daily as needed for allergies.  08/08/12   [provider]  senna (SENOKOT) 8.6 MG TABS tablet Take 1 tablet by mouth daily.    [provider]  ticagrelor  (BRILINTA ) 90 MG TABS tablet Take 0.5 tablets (45 mg total) by mouth 2 (two) times daily. 12/03/22   Adelia Homestead, MD  tirzepatide [facility]) 2.5 MG/0.5ML Pen Inject 2.5 mg into the skin once a week. 04/28/23   Adelia Homestead, MD  Vitamin D , Ergocalciferol , (DRISDOL ) 1.25 MG (50000 UNIT) CAPS capsule Take 1 capsule (50,000 Units total) by mouth every 7 (seven) days. 08/27/21   Adelia Homestead, MD      Allergies    Nutritional supplements, Dust mite extract, Metformin  and related, Morphine , Augmentin  [amoxicillin -pot clavulanate], and Influenza vaccines    Review of Systems   Review of Systems  All other systems reviewed and are negative.   Physical Exam Updated Vital Signs BP (!) 193/83   Pulse 64   Temp 98.5 F (36.9 C) (Oral)   Resp 18   Wt (!) 145.2 kg   LMP 07/28/2011   SpO2 100%   BMI 48.66 kg/m  Physical Exam Vitals and nursing note reviewed.  Constitutional:      General: She is not in acute distress.    Appearance: She is well-developed.  HENT:     Head: Normocephalic and atraumatic.  Eyes:     Conjunctiva/sclera: Conjunctivae normal.  Cardiovascular:     Rate and Rhythm: Normal rate and regular rhythm.     Heart sounds: No murmur heard. Pulmonary:     Effort: Pulmonary effort is normal. No respiratory distress.     Breath sounds: Normal breath sounds.  Abdominal:     Palpations: Abdomen is soft.     Tenderness: There is no abdominal tenderness.  Musculoskeletal:        General: No swelling.     Cervical back: Neck supple.     Comments: For range of motion digits of right hand as well as wrist.  Patient with tenderness proximal aspect of first metacarpal right hand as  well as over anatomical snuff.  Pain with axial loading of the thumb.  Otherwise, no tenderness right hand/wrist.  Radial pulses 2+ bilaterally.  Cap refill less than 2 seconds.  No overlying erythema, palpable fluctuance/induration.  Skin:    General: Skin is warm and dry.     Capillary Refill: Capillary refill takes less than 2 seconds.  Neurological:     Mental Status: She is alert.  Psychiatric:        Mood and Affect: Mood normal.     ED Results / Procedures / Treatments   Labs (all labs ordered are listed, but only abnormal results are displayed) Labs Reviewed - No data  to display  EKG None  Radiology DG Hand Complete Right Result Date: 09/30/2023 CLINICAL DATA:  Status post fall with hand pain EXAM: RIGHT HAND - COMPLETE 3+ VIEW COMPARISON:  None Available. FINDINGS: There is no evidence of fracture or dislocation. There is no evidence of arthropathy or other focal bone abnormality. Soft tissues are unremarkable. IMPRESSION: Negative. Electronically Signed   By: Fredrich Jefferson M.D.   On: 09/30/2023 17:07    Procedures Procedures    Medications Ordered in ED Medications  ketorolac (TORADOL) 30 MG/ML injection 30 mg (30 mg Intramuscular Given 09/30/23 1709)    ED Course/ Medical Decision Making/ A&P                                 Medical Decision Making Amount and/or Complexity of Data Reviewed Radiology: ordered.  Risk Prescription drug management.   This patient presents to the ED for concern of hand pain, this involves an extensive number of treatment options, and is a complaint that carries with it a high risk of complications and morbidity.  The differential diagnosis includes fracture, strain/pain, dislocation, ligament/tendon injury, neurovascular otherwise, septic arthritis, cellulitis, sepsis, necrotizing infection, other   Co morbidities that complicate the patient evaluation  See HPI   Additional history obtained:  Additional history obtained from  EMR External records from outside source obtained and reviewed including hospital records   Lab Tests:  N/a   Imaging Studies ordered:  I ordered imaging studies including x-ray right hand I independently visualized and interpreted imaging which showed no acute osseous abnormality I agree with the radiologist interpretation   Cardiac Monitoring: / EKG:  N/a   Consultations Obtained:  N/a   Problem List / ED Course / Critical interventions / Medication management  Right hand pain I ordered medication including toradol   Reevaluation of the patient after these medicines showed that the patient improved I have reviewed the patients home medicines and have made adjustments as needed   Social Determinants of Health:  Denies tobacco, licit drug use.   Test / Admission - Considered:  Right hand pain Vitals signs significant for hypertension.  Patient did not take any other blood pressure medications over the last 24 to 48 hours.  Elects to take her at home medications when she gets home without any further treatment or workup for her blood pressure currently.. Otherwise within normal range and stable throughout visit. Imaging studies significant for: See above 48 year old female presents emergency department with complaints of right thumb pain.  States she had 2 falls over the past couple of weeks.  The first 1, she tripped on an object and landed on outstretched arms.  The second 1 about a week and a half ago, got her left foot caught on her right pant leg and fell forward bracing her fall with her hands.  She says she did hear her right knee at that time which symptoms have since resolved.  Since the second fall, has had pain in the base of her right thumb.  Pain persistent despite use of over-the-counter medications.  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  States she is right-handed. On exam, patient  with tenderness right first proximal metacarpal as well as over anatomical snuffbox tenderness otherwise, no tenderness appreciated on exam.  No pulse deficits digits as ischemic limb.  No overlying  skin changes concerning for secondary infectious process.  X-ray obtained by triage staff was negative for any acute osseous abnormality.  Given area of patient's tender, concern for possible scaphoid fracture even though x-ray obtained was around a week and a half after most recent trauma.  Patient placed in wrist brace with thumb abduction on the ED.  Will recommend follow-up with hand orthopedics in the outpatient setting for reassessment.  Recommend continued symptomatic therapy as described in AVS.  Treatment plan discussed with patient and she acknowledged and standing was agreeable to said plan.  Patient well-appearing, afebrile in no acute distress. Worrisome signs and symptoms were discussed with the patient, and the patient acknowledged understanding to return to the ED if noticed. Patient was stable upon discharge.          Final Clinical Impression(s) / ED Diagnoses Final diagnoses:  Right hand pain    Rx / DC Orders ED Discharge Orders     None         [place], [place] 09/30/23 1728    Sallyanne Creamer, DO 10/02/23 2239

## 2023-09-30 NOTE — ED Notes (Signed)

## 2023-10-17 ENCOUNTER — Encounter (HOSPITAL_COMMUNITY): Payer: Self-pay | Admitting: Interventional Radiology

## 2023-10-18 DIAGNOSIS — G4733 Obstructive sleep apnea (adult) (pediatric): Secondary | ICD-10-CM | POA: Diagnosis not present

## 2023-10-27 DIAGNOSIS — S6991XD Unspecified injury of right wrist, hand and finger(s), subsequent encounter: Secondary | ICD-10-CM | POA: Diagnosis not present

## 2023-10-27 DIAGNOSIS — M1811 Unilateral primary osteoarthritis of first carpometacarpal joint, right hand: Secondary | ICD-10-CM | POA: Diagnosis not present

## 2023-10-30 ENCOUNTER — Telehealth: Payer: Self-pay | Admitting: Pharmacy Technician

## 2023-10-30 NOTE — Progress Notes (Signed)
   10/30/2023  Patient ID: Debra [name], female   DOB: 1975/07/14, 48 y.o.   MRN: [id number]  Patient engaged with clinical pharmacist for management of diabetes on 06/16/2023. Outreach by [employer] technician was requested.   Outreached patient to discuss diabetes medication management. Left voicemail for patient to return my call at their convenience.    Jakala Herford, CPhT [facility] Office: [phone] Email: [email or web address]

## 2023-11-03 ENCOUNTER — Telehealth: Payer: Self-pay | Admitting: Pharmacy Technician

## 2023-11-03 NOTE — Progress Notes (Addendum)
   11/03/2023 Name: Debra [name] MRN: [id number] DOB: 09-Feb-1976  Patient is appearing on a report for [facility] [place] [facility] Diabetes and last engaged with the clinical pharmacist to discuss diabetes on 06/16/2023. Contacted patient today to discuss diabetes management and completed medication review.   Diabetes Plan from last clinical pharmacist appointment:  Diabetes: - Currently uncontrolled, A1c goal <7% - Recommend to continue current regimen.  - Recommend to check glucose at home regularly - Await next A1c, April PCP Follow Up Plan: PRN   Medication Adherence Barriers Identified:  Patient made recommended medication changes per plan: Yes Patient informs she is taking Mounjaro  2mg  weekly and Glimepiride 2mg  twice a day for a total daily dose of 4mg .  Access issues with any new medication or testing device: No Glimepiride 2mg  last filled for 30 days supply on 6/4 and Mounjaro  last filled for 28 days supply on 10/29/23. No cost concerns noted. Patient is interested in going up on Mounjaro  Dose. Will send in basket to PharmD. Patient informs she needs to follow up with PCP. Patient encouraged to call and schedule appointment.  Patient is checking blood sugars as prescribed: Yes She informs she is checking blood sugar though not sure how often. She informs she has not have any blood sugars too high or too low and informs they range from 135-150. Patient last appointment with PCP in April was missed and patient was encourage to call clinic to schedule.  Medication Adherence Barriers Addressed/Actions Taken:  Reviewed medication changes per plan from last clinical pharmacist note Will discuss medication access concerns with pharmacist Reviewed instructions for monitoring blood sugars at home and reminded patient to keep a written log to review with pharmacist Reminded patient of date/time of upcoming clinical pharmacist follow up and any upcoming PCP/specialists visits. Patient denies  transportation barriers to the appointment. Yes  Next clinical pharmacist appointment is scheduled for: TBD   Kate Caddy, CPhT [facility] Office: [phone] Email: [email or web address]

## 2023-11-20 ENCOUNTER — Other Ambulatory Visit (INDEPENDENT_AMBULATORY_CARE_PROVIDER_SITE_OTHER): Admitting: Pharmacist

## 2023-11-20 DIAGNOSIS — E118 Type 2 diabetes mellitus with unspecified complications: Secondary | ICD-10-CM

## 2023-11-20 DIAGNOSIS — I10 Essential (primary) hypertension: Secondary | ICD-10-CM

## 2023-11-20 MED ORDER — TIRZEPATIDE 5 MG/0.5ML ~~LOC~~ SOAJ: 5.0000 mg | SUBCUTANEOUS | 0 refills | Status: DC

## 2023-11-20 NOTE — Progress Notes (Signed)
 11/20/2023 Name: Debra [name] MRN: [id number] DOB: 1975-06-11  Chief Complaint  Patient presents with   Diabetes   Medication Management    Debra [name] is a 48 y.o. year old female who presented for a telephone visit.   They were referred to the pharmacist by their PCP for assistance in managing diabetes.   Subjective:  Care Team: Primary Care Provider: Rollene Almarie LABOR, MD ; Next Scheduled Visit: 11/24/23   Medication Access/Adherence  Current Pharmacy:  [id number] [address], [place] - [address] AT [facility] OF [facility] [address] [place] [address] Phone: [phone] Fax: [phone]   Patient reports affordability concerns with their medications: No  Patient reports access/transportation concerns to their pharmacy: No  Patient reports adherence concerns with their medications:  No    Pt mentions she has not been taking losartan /hydrochlorothiazide  because when she takes it she feels off - describes it as feeling too dry. Gets muscle cramping.  Diabetes:  Current medications: mounjaro  2.5 mg weekly, glimepiride 4 mg daily Medications tried in the past: Ozempic , Rybelsus , Jardiance , glipizide , Tresiba , Basaglar , Lantus , Soliqua , metformin , Trulicity   Pt wants to try increasing Mounjaro  to 5 mg  Current glucose readings: BG 167, 177 (bedtime)  *She notes she previously was unable to tolerate the Mounjaro  5 mg dose. She has a history of lap band surgery. Prevously saw Dr. Balan for endocrinology.   Objective:  Lab Results  Component Value Date   HGBA1C 10.4 04/28/2023    Lab Results  Component Value Date   CREATININE 1.25 (H) 08/21/2021   BUN 14 08/21/2021   NA 139 08/21/2021   K 3.7 08/21/2021   CL 103 08/21/2021   CO2 29 08/21/2021    Lab Results  Component Value Date   CHOL 168 08/21/2021   HDL 46.30 08/21/2021   LDLCALC 96 08/21/2021   TRIG 127.0 08/21/2021   CHOLHDL 4 08/21/2021     Medications Reviewed Today     Reviewed by Merceda Lela SAUNDERS, RPH (Pharmacist) on 11/20/23 at 1615  Med List Status: <None>   Medication Order Taking? Sig Documenting Provider Last Dose Status Informant  albuterol  (PROAIR  HFA) 108 (90 Base) MCG/ACT inhaler [id number]  Inhale 2 puffs into the lungs every 4 (four) hours as needed for wheezing or shortness of breath. [name] Corean LITTIE, FNP  Active   albuterol  (PROVENTIL ) (2.5 MG/3ML) 0.083% nebulizer solution [id number]  Take 3 mLs (2.5 mg total) by nebulization every 6 (six) hours as needed for wheezing or shortness of breath. Rollene Almarie LABOR, MD  Active   aspirin  EC 81 MG tablet [id number]  Take 81 mg by mouth daily. Swallow whole. [provider]  Active Self  blood glucose meter kit and supplies [id number]  Dispense based on patient and insurance preference. Use up to four times daily as directed. (FOR ICD-9 250.00, 250.01). Inocencio Berwyn LABOR, MD  Active Self  Blood Glucose Monitoring Suppl (ONE TOUCH ULTRA 2) w/Device KIT [id number]  1 application  by Does not apply route daily at 4 PM. Rollene Almarie LABOR, MD  Active   celecoxib  (CELEBREX ) 200 MG capsule [id number]  Take 1 capsule (200 mg total) by mouth 2 (two) times daily as needed. Silver Wonda LABOR, [place]  Active   Continuous Blood Gluc Sensor MISC [id number]  1 each by Does not apply route as directed. Use as directed every 14 days. May dispense FreeStyle [employer] or similar. Rollene Almarie LABOR,  MD  Active Self  EPINEPHrine  0.3 mg/0.3 mL IJ SOAJ injection [id number]  Inject 0.3 mLs (0.3 mg total) into the muscle as needed for anaphylaxis. Rollene Almarie LABOR, MD  Active Self  fexofenadine (ALLEGRA) 180 MG tablet [id number]  Take 180 mg by mouth daily.  [provider]  Active Self  fluconazole  (DIFLUCAN ) 150 MG tablet [id number]  Take 1 tablet (150 mg total) by mouth every 3 (three) days. Rollene Almarie LABOR, MD  Active   fluticasone  (FLONASE ) 50  MCG/ACT nasal spray [id number]  Place 2 sprays into both nostrils daily as needed for allergies. Rollene Almarie LABOR, MD  Active   glimepiride (AMARYL) 4 MG tablet [id number] Yes Take 4 mg by mouth daily with breakfast. [provider]  Active Self  glucose blood test strip [id number]  Used to check blood sugars up to four times daily as needed Rollene Almarie LABOR, MD  Active   ibuprofen  (ADVIL ) 800 MG tablet [id number]  TAKE 1 TABLET(800 MG) BY MOUTH EVERY 8 HOURS AS NEEDED FOR MODERATE PAIN Rollene Almarie LABOR, MD  Active   Lancets [facility] DELICA PLUS [place]) MISC [id number]  1 application. by Does not apply route daily. Rollene Almarie LABOR, MD  Active Self  losartan -hydrochlorothiazide  [facility]) 100-12.5 MG tablet [id number]  Take 1 tablet by mouth daily.  Patient not taking: Reported on 11/20/2023   Rollene Almarie LABOR, MD  Active   Misc Natural Products [facility] IMMUNE COMPLEX PO) [id number]  Take 2 each by mouth daily. [provider]  Active Self  montelukast  (SINGULAIR ) 10 MG tablet [id number]  Take 1 tablet (10 mg total) by mouth at bedtime.  Patient taking differently: Take 10 mg by mouth at bedtime as needed.   Rollene Almarie LABOR, MD  Active Self  Multiple Vitamin (MULTIVITAMIN) tablet [id number]  Take 1 tablet by mouth daily. [provider]  Active Self  Multiple Vitamins-Minerals (ONE A DAY IMMUNITY DEFENSE PO) [id number]  Take 1 tablet by mouth daily. [provider]  Active Self  olopatadine  (PATANOL) 0.1 % ophthalmic solution [id number]  Place 1 drop into both eyes 2 (two) times daily as needed for allergies.  [provider]  Active Self  senna (SENOKOT) 8.6 MG TABS tablet [id number]  Take 1 tablet by mouth daily. [provider]  Active Self  ticagrelor  (BRILINTA ) 90 MG TABS tablet [id number]  Take 0.5 tablets (45 mg total) by mouth 2 (two) times daily. Rollene Almarie LABOR, MD  Active   tirzepatide  (MOUNJARO ) 2.5  MG/0.5ML Pen [id number] Yes Inject 2.5 mg into the skin once a week. Rollene Almarie LABOR, MD  Active   Vitamin D , Ergocalciferol , (DRISDOL ) 1.25 MG (50000 UNIT) CAPS capsule [id number]  Take 1 capsule (50,000 Units total) by mouth every 7 (seven) days. Rollene Almarie LABOR, MD  Active Self              Assessment/Plan:   Diabetes: - Currently uncontrolled, A1c goal <7% - Recommend to try increasing Mounjaro  to 5 mg weekly. Counseled on possibility of GI side effects, especially when increasing the dose. Often these will subside after 2 weeks - Recommend to check glucose at home regularly - Await next A1c on 8/4.25  HTN: BP has been running very elevated secondary to medication nonadherence due to pt not tolerating losartan /hydrochlorothiazide . Advised pt to discuss w/ PCP at Monday appt - may consider a regimen that does not contain a diuretic?   Follow Up Plan: 8/14  Darrelyn Drum, PharmD, BCPS, CPP Clinical Pharmacist Practitioner  [facility] [phone]

## 2023-11-20 NOTE — Patient Instructions (Signed)
 It was a pleasure speaking with you today!  Start Mounjaro  5 mg weekly - monitor for side effects, which may subside in 2 weeks if they occur. Come 11/24/23 for appointment with Dr. Rollene.  Feel free to call with any questions or concerns!  Darrelyn Drum, PharmD, BCPS, CPP Clinical Pharmacist Practitioner [facility] [facility] [phone]

## 2023-11-24 ENCOUNTER — Encounter: Payer: Self-pay | Admitting: Internal Medicine

## 2023-11-24 ENCOUNTER — Ambulatory Visit (INDEPENDENT_AMBULATORY_CARE_PROVIDER_SITE_OTHER): Admitting: Internal Medicine

## 2023-11-24 VITALS — BP 148/90 | HR 69 | Temp 97.9°F | Ht 68.0 in | Wt 337.0 lb

## 2023-11-24 DIAGNOSIS — E785 Hyperlipidemia, unspecified: Secondary | ICD-10-CM | POA: Diagnosis not present

## 2023-11-24 DIAGNOSIS — J4520 Mild intermittent asthma, uncomplicated: Secondary | ICD-10-CM | POA: Diagnosis not present

## 2023-11-24 DIAGNOSIS — E118 Type 2 diabetes mellitus with unspecified complications: Secondary | ICD-10-CM | POA: Diagnosis not present

## 2023-11-24 DIAGNOSIS — Z8673 Personal history of transient ischemic attack (TIA), and cerebral infarction without residual deficits: Secondary | ICD-10-CM | POA: Diagnosis not present

## 2023-11-24 DIAGNOSIS — I10 Essential (primary) hypertension: Secondary | ICD-10-CM

## 2023-11-24 DIAGNOSIS — G4733 Obstructive sleep apnea (adult) (pediatric): Secondary | ICD-10-CM

## 2023-11-24 DIAGNOSIS — Z9884 Bariatric surgery status: Secondary | ICD-10-CM | POA: Diagnosis not present

## 2023-11-24 DIAGNOSIS — R42 Dizziness and giddiness: Secondary | ICD-10-CM | POA: Diagnosis not present

## 2023-11-24 DIAGNOSIS — E1169 Type 2 diabetes mellitus with other specified complication: Secondary | ICD-10-CM | POA: Diagnosis not present

## 2023-11-24 LAB — LIPID PANEL
Cholesterol: 190 mg/dL (ref 0–200)
HDL: 54.7 mg/dL (ref >39.00)
LDL Cholesterol: 107 mg/dL — ABNORMAL HIGH (ref 0–99)
NonHDL: 134.82
Total CHOL/HDL Ratio: 3
Triglycerides: 140 mg/dL (ref 0.0–149.0)
VLDL: 28 mg/dL (ref 0.0–40.0)

## 2023-11-24 LAB — COMPREHENSIVE METABOLIC PANEL WITH GFR
ALT: 15 U/L (ref 0–35)
AST: 12 U/L (ref 0–37)
Albumin: 4.2 g/dL (ref 3.5–5.2)
Alkaline Phosphatase: 92 U/L (ref 39–117)
BUN: 13 mg/dL (ref 6–23)
CO2: 31 meq/L (ref 19–32)
Calcium: 9.4 mg/dL (ref 8.4–10.5)
Chloride: 104 meq/L (ref 96–112)
Creatinine, Ser: 0.8 mg/dL (ref 0.40–1.20)
GFR: 86.42 mL/min (ref >60.00)
Glucose, Bld: 94 mg/dL (ref 70–99)
Potassium: 3.9 meq/L (ref 3.5–5.1)
Sodium: 143 meq/L (ref 135–145)
Total Bilirubin: 0.3 mg/dL (ref 0.2–1.2)
Total Protein: 7.5 g/dL (ref 6.0–8.3)

## 2023-11-24 LAB — CBC
HCT: 41.8 % (ref 36.0–46.0)
Hemoglobin: 13.2 g/dL (ref 12.0–15.0)
MCHC: 31.7 g/dL (ref 30.0–36.0)
MCV: 80.5 fl (ref 78.0–100.0)
Platelets: 277 K/uL (ref 150.0–400.0)
RBC: 5.19 Mil/uL — ABNORMAL HIGH (ref 3.87–5.11)
RDW: 13.8 % (ref 11.5–15.5)
WBC: 8.5 K/uL (ref 4.0–10.5)

## 2023-11-24 LAB — HEMOGLOBIN A1C: Hgb A1c MFr Bld: 8.5 % — ABNORMAL HIGH (ref 4.6–6.5)

## 2023-11-24 LAB — MICROALBUMIN / CREATININE URINE RATIO
Creatinine,U: 70.3 mg/dL
Microalb Creat Ratio: 21.1 mg/g (ref 0.0–30.0)
Microalb, Ur: 1.5 mg/dL (ref 0.0–1.9)

## 2023-11-24 NOTE — Progress Notes (Unsigned)
 Subjective:   Patient ID: Annabella LITTIE Moats, female    DOB: 08-31-1975, 48 y.o.   MRN: [id number]  Discussed the use of AI scribe software for clinical note transcription with the patient, who gave verbal consent to proceed.  History of Present Illness Allysson Rinehimer Kamm is a 48 year old female with hypertension who presents with concerns about blood pressure management and recent falls.  She is experiencing issues with blood pressure management, noting that her medication causes hypotension, leading to dizziness and near-syncope. She has attempted to adjust her medication dosage by reducing it to half and then to a quarter, but continues to experience adverse effects. She reports that her blood pressure was at 100 when she halved the dose.  She describes experiencing two falls within a two-week period, which she attributes to feeling tired and having her feet turn inward, increasing her likelihood of stumbling. These falls occurred while she was under pressure to be on time for work.  She has a history of a bone spur in her hand, which flared up after a fall. She has been wearing a brace and reports that the condition is improving, allowing her to regain functionality in her hand.  She discusses her weight management efforts, noting a weight loss of approximately ten pounds since January. She walks six to seven times a day and tries to stay active at work. She mentions having a lap band and experiences restriction with swallowing, which affects her fluid intake. She is mindful of her blood sugar levels and has been on a 2.5 mg dose of medication for quite some time, which she administers in her leg to avoid reactions.  She has concerns about her knees and acknowledges that weight loss may help alleviate some of the pressure on them. She also mentions a history of a stroke, which makes her cautious about surgical interventions.  She is actively involved in caring for her mother, which has been a  significant part of her life. She has recently started to prioritize her own health and well-being, with support from her brother.     Review of Systems  Objective:  Physical Exam  Vitals:   11/24/23 1532  BP: (!) 148/90  Pulse: 69  Temp: 97.9 F (36.6 C)  TempSrc: Temporal  SpO2: 97%  Weight: (!) 337 lb (152.9 kg)  Height: 5' 8 (1.727 m)    Assessment and Plan Assessment & Plan Hypertension with medication intolerance and hypotensive episodes   Hypertension management is complicated by medication intolerance and hypotension, causing dizziness and increasing fall risk. Dosage adjustments have not resolved symptoms. Check blood pressure today and perform blood work to assess her current status.  Type 2 diabetes mellitus   Her Type 2 diabetes is well-managed with medication, and blood sugars are responding well. Continue the current diabetes medication regimen and monitor blood sugar levels.  Obesity status post gastric banding   She is status post gastric banding and considering sleeve gastrectomy. She has lost nearly ten pounds since January through lifestyle changes. Concerns include post-surgical care for her mother and potential complications, but Medicaid coverage is available. Discuss the potential for sleeve gastrectomy and consider family resources for post-surgical care support.  Right hand bone spur   The bone spur in her right hand is improving with brace use, and she is regaining function.  Recurrent falls   She experienced two falls in the past two weeks, possibly due to dizziness from blood pressure medication. Work-related stress may also  contribute. Evaluate blood pressure management to reduce dizziness and fall risk.  Chronic knee pain   Chronic knee pain is managed through weight loss and lifestyle changes. Slow weight loss may alleviate pain. Encourage continued weight loss efforts to alleviate knee pain.

## 2023-11-24 NOTE — Patient Instructions (Signed)
 We will check the labs today.

## 2023-11-25 NOTE — Assessment & Plan Note (Signed)
 No new symptoms. She is not taking statin currently and has not taken BP meds in about 2 weeks. Checking lipid panel and CMP and HgA1c. Adjust as needed.

## 2023-11-25 NOTE — Assessment & Plan Note (Signed)
 Foot exam done, reminded about eye exam. Checking HgA1c, lipid panel, CMP, UACR. Adjust as needed mounjaro  and amaryl.

## 2023-11-25 NOTE — Assessment & Plan Note (Signed)
 Checking lipid panel and adjust as needed she is not on statin at this time.

## 2023-11-25 NOTE — Assessment & Plan Note (Signed)
 BP is mildly elevated today and she has not taken meds in 2 weeks due to feeling it was making BP too low. She was taking 1/4 pill of losartan /hydrochlorothiazide . She wishes to monitor BP at home and if elevated resume medication. Her goal for BP is <130/80 due to history of stroke.

## 2023-11-25 NOTE — Assessment & Plan Note (Signed)
 No flare today and she has albuterol  inhaler and nebulizer available. Singulair  and other for allergies which help limit flare.

## 2023-11-25 NOTE — Assessment & Plan Note (Signed)
 Taking mounjaro  5 mg weekly and no side effects. Weight down about 10 pounds in last 6 months.

## 2023-11-25 NOTE — Assessment & Plan Note (Signed)
 She has interest in seeing general surgery for different procedure while she has insurance coverage.

## 2023-11-25 NOTE — Assessment & Plan Note (Signed)
 She has stopped BP medication for about 2 weeks due to feeling low BP.

## 2023-11-25 NOTE — Assessment & Plan Note (Signed)
 Using CPAP regularly still.

## 2023-12-01 ENCOUNTER — Ambulatory Visit: Payer: Self-pay | Admitting: Internal Medicine

## 2023-12-04 ENCOUNTER — Other Ambulatory Visit: Admitting: Pharmacist

## 2023-12-04 DIAGNOSIS — E118 Type 2 diabetes mellitus with unspecified complications: Secondary | ICD-10-CM

## 2023-12-04 DIAGNOSIS — Z7984 Long term (current) use of oral hypoglycemic drugs: Secondary | ICD-10-CM

## 2023-12-04 NOTE — Patient Instructions (Addendum)
 It was a pleasure speaking with you today!  Start taking Mounjaro  5mg  weekly after your next 2.5mg  injection.  Continue to check blood glucose and blood pressure at home.   I will follow up with you in 1 month.   Feel free to call with any questions or concerns!  Lum Ricks, PharmD Candidate  [employer], [employer]    Darrelyn Drum, PharmD, [employer], CPP Clinical Pharmacist Practitioner [place] [phone]

## 2023-12-04 NOTE — Progress Notes (Signed)
   12/04/2023 Name: Debra [name] MRN: [id number] DOB: [birth year]  Chief Complaint  Patient presents with   Diabetes   Medication Management    Debra [name] is a 48 y.o. year old female who presented for a telephone visit.   They were referred to the pharmacist by their PCP for assistance in managing diabetes.   Subjective:  Care Team: Primary Care Provider: Rollene Almarie LABOR, MD ; Next Scheduled Visit: 03/01/2024   Medication Access/Adherence  Current Pharmacy:  [facility] #[id number] [address], [place] - [address] AT [facility] OF [address] [place] [address] Phone: [phone] Fax: [phone]   Patient reports affordability concerns with their medications: No  Patient reports access/transportation concerns to their pharmacy: No  Patient reports adherence concerns with their medications:  No     Diabetes:  Current medications: mounjaro  2.5 mg weekly, glimepiride 4 mg daily Medications tried in the past: Ozempic , Rybelsus , Jardiance , glipizide , Tresiba , Basaglar , Lantus , Soliqua , metformin , Trulicity     *She notes that she has not increased mounjaro  to 5mg  weekly because she forgot to pick it up, but plans to start it next week.    Objective:  Lab Results  Component Value Date   HGBA1C 8.5 (H) 11/24/2023    Lab Results  Component Value Date   CREATININE 0.80 11/24/2023   BUN 13 11/24/2023   NA 143 11/24/2023   K 3.9 11/24/2023   CL 104 11/24/2023   CO2 31 11/24/2023    Lab Results  Component Value Date   CHOL 190 11/24/2023   HDL 54.70 11/24/2023   LDLCALC 107 (H) 11/24/2023   TRIG 140.0 11/24/2023   CHOLHDL 3 11/24/2023    Medications Reviewed Today   Medications were not reviewed in this encounter       Assessment/Plan:   Diabetes: - Currently uncontrolled, A1c goal <7% - Recommend to continue current regimen, increase Mounjaro  to 5mg  weekly next week.   - Recommend to check glucose at  home  - Discussed mounjaro  side effects when increasing dose.  - Encouraged hydration    HTN: - Not currently on medication due to pt reported hypotension symptoms - Recommend to check blood pressure at home.     Follow Up Plan: 1 month  Lum Ricks, PharmD Candidate  [employer], [employer]    Darrelyn Drum, PharmD, [employer], CPP Clinical Pharmacist Practitioner [place] [facility] [phone]

## 2023-12-11 ENCOUNTER — Other Ambulatory Visit: Payer: Self-pay | Admitting: Internal Medicine

## 2023-12-11 NOTE — Telephone Encounter (Unsigned)
 Copied from CRM #[id number]. Topic: Clinical - Medication Refill >> Dec 11, 2023  2:54 PM Drema MATSU wrote: Medication: ibuprofen  (ADVIL ) 800 MG tablet  Has the patient contacted their pharmacy? Yes (Agent: If no, request that the patient contact the pharmacy for the refill. If patient does not wish to contact the pharmacy document the reason why and proceed with request.) no more refills (Agent: If yes, when and what did the pharmacy advise?)  This is the patient's preferred pharmacy:  [facility] #[id number] [address], [place] - [address] AT [facility] [address] [place] [address] Phone: [phone] Fax: [phone]  Is this the correct pharmacy for this prescription? Yes If no, delete pharmacy and type the correct one.   Has the prescription been filled recently? No  Is the patient out of the medication? Yes  Has the patient been seen for an appointment in the last year OR does the patient have an upcoming appointment? Yes  Can we respond through MyChart? Yes  Agent: Please be advised that Rx refills may take up to 3 business days. We ask that you follow-up with your pharmacy.

## 2023-12-13 ENCOUNTER — Other Ambulatory Visit: Payer: Self-pay | Admitting: Internal Medicine

## 2023-12-15 ENCOUNTER — Encounter: Payer: Self-pay | Admitting: Internal Medicine

## 2023-12-15 NOTE — Telephone Encounter (Signed)
 Copied from CRM #[id number]. Topic: Clinical - Medication Question >> Dec 15, 2023 10:10 AM Burnard DEL wrote: Reason for CRM: Patient called in to ask about refusal on her 800 ibruprofen. She stated that she has knee pain and she has tried to take OTC ibuprofen ,however they do  not work for her.

## 2023-12-25 ENCOUNTER — Other Ambulatory Visit: Payer: Self-pay | Admitting: Internal Medicine

## 2023-12-25 DIAGNOSIS — I6603 Occlusion and stenosis of bilateral middle cerebral arteries: Secondary | ICD-10-CM

## 2023-12-31 ENCOUNTER — Other Ambulatory Visit (HOSPITAL_COMMUNITY): Payer: Self-pay

## 2024-01-01 ENCOUNTER — Other Ambulatory Visit

## 2024-01-02 ENCOUNTER — Observation Stay (HOSPITAL_COMMUNITY)
Admission: EM | Admit: 2024-01-02 | Discharge: 2024-01-03 | Disposition: A | Discharge status: Home / Self Care | Attending: Family Medicine | Admitting: Family Medicine

## 2024-01-02 ENCOUNTER — Emergency Department (HOSPITAL_COMMUNITY)

## 2024-01-02 ENCOUNTER — Other Ambulatory Visit: Payer: Self-pay

## 2024-01-02 DIAGNOSIS — Z794 Long term (current) use of insulin: Secondary | ICD-10-CM | POA: Insufficient documentation

## 2024-01-02 DIAGNOSIS — R4182 Altered mental status, unspecified: Secondary | ICD-10-CM | POA: Diagnosis not present

## 2024-01-02 DIAGNOSIS — R4702 Dysphasia: Secondary | ICD-10-CM | POA: Diagnosis not present

## 2024-01-02 DIAGNOSIS — I6389 Other cerebral infarction: Principal | ICD-10-CM | POA: Insufficient documentation

## 2024-01-02 DIAGNOSIS — I6601 Occlusion and stenosis of right middle cerebral artery: Secondary | ICD-10-CM | POA: Diagnosis not present

## 2024-01-02 DIAGNOSIS — Z6841 Body Mass Index (BMI) 40.0 and over, adult: Secondary | ICD-10-CM | POA: Insufficient documentation

## 2024-01-02 DIAGNOSIS — I69320 Aphasia following cerebral infarction: Secondary | ICD-10-CM

## 2024-01-02 DIAGNOSIS — E1169 Type 2 diabetes mellitus with other specified complication: Secondary | ICD-10-CM | POA: Diagnosis not present

## 2024-01-02 DIAGNOSIS — I674 Hypertensive encephalopathy: Secondary | ICD-10-CM | POA: Diagnosis not present

## 2024-01-02 DIAGNOSIS — Z7982 Long term (current) use of aspirin: Secondary | ICD-10-CM | POA: Insufficient documentation

## 2024-01-02 DIAGNOSIS — R4781 Slurred speech: Secondary | ICD-10-CM | POA: Diagnosis present

## 2024-01-02 DIAGNOSIS — I6529 Occlusion and stenosis of unspecified carotid artery: Secondary | ICD-10-CM | POA: Diagnosis not present

## 2024-01-02 DIAGNOSIS — G939 Disorder of brain, unspecified: Secondary | ICD-10-CM | POA: Diagnosis not present

## 2024-01-02 DIAGNOSIS — E875 Hyperkalemia: Secondary | ICD-10-CM | POA: Insufficient documentation

## 2024-01-02 DIAGNOSIS — E785 Hyperlipidemia, unspecified: Secondary | ICD-10-CM | POA: Diagnosis not present

## 2024-01-02 DIAGNOSIS — E119 Type 2 diabetes mellitus without complications: Secondary | ICD-10-CM | POA: Insufficient documentation

## 2024-01-02 DIAGNOSIS — I6782 Cerebral ischemia: Secondary | ICD-10-CM | POA: Diagnosis not present

## 2024-01-02 DIAGNOSIS — R001 Bradycardia, unspecified: Secondary | ICD-10-CM | POA: Diagnosis not present

## 2024-01-02 DIAGNOSIS — G4733 Obstructive sleep apnea (adult) (pediatric): Secondary | ICD-10-CM | POA: Diagnosis not present

## 2024-01-02 DIAGNOSIS — R29701 NIHSS score 1: Secondary | ICD-10-CM | POA: Diagnosis not present

## 2024-01-02 DIAGNOSIS — I639 Cerebral infarction, unspecified: Principal | ICD-10-CM | POA: Diagnosis present

## 2024-01-02 DIAGNOSIS — G9389 Other specified disorders of brain: Secondary | ICD-10-CM | POA: Diagnosis not present

## 2024-01-02 DIAGNOSIS — I700 Atherosclerosis of aorta: Secondary | ICD-10-CM | POA: Insufficient documentation

## 2024-01-02 DIAGNOSIS — I10 Essential (primary) hypertension: Secondary | ICD-10-CM | POA: Diagnosis not present

## 2024-01-02 DIAGNOSIS — I6381 Other cerebral infarction due to occlusion or stenosis of small artery: Secondary | ICD-10-CM | POA: Diagnosis not present

## 2024-01-02 DIAGNOSIS — R4701 Aphasia: Secondary | ICD-10-CM | POA: Diagnosis not present

## 2024-01-02 DIAGNOSIS — R457 State of emotional shock and stress, unspecified: Secondary | ICD-10-CM | POA: Diagnosis not present

## 2024-01-02 LAB — CBC
HCT: 42.5 % (ref 36.0–46.0)
HCT: 42.3 % (ref 36.0–46.0)
Hemoglobin: 13 g/dL (ref 12.0–15.0)
Hemoglobin: 13 g/dL (ref 12.0–15.0)
MCH: 25.6 pg — ABNORMAL LOW (ref 26.0–34.0)
MCH: 25.4 pg — ABNORMAL LOW (ref 26.0–34.0)
MCHC: 30.6 g/dL (ref 30.0–36.0)
MCHC: 30.7 g/dL (ref 30.0–36.0)
MCV: 83.7 fL (ref 80.0–100.0)
MCV: 82.8 fL (ref 80.0–100.0)
Platelets: 218 K/uL (ref 150–400)
Platelets: 268 K/uL (ref 150–400)
RBC: 5.08 MIL/uL (ref 3.87–5.11)
RBC: 5.11 MIL/uL (ref 3.87–5.11)
RDW: 13.1 % (ref 11.5–15.5)
RDW: 13.1 % (ref 11.5–15.5)
WBC: 7.7 K/uL (ref 4.0–10.5)
WBC: 9.7 K/uL (ref 4.0–10.5)
nRBC: 0 % (ref 0.0–0.2)
nRBC: 0 % (ref 0.0–0.2)

## 2024-01-02 LAB — PROTIME-INR
INR: 1 (ref 0.8–1.2)
Prothrombin Time: 14.1 s (ref 11.4–15.2)

## 2024-01-02 LAB — DIFFERENTIAL
Abs Immature Granulocytes: 0.02 K/uL (ref 0.00–0.07)
Basophils Absolute: 0 K/uL (ref 0.0–0.1)
Basophils Relative: 1 %
Eosinophils Absolute: 0.1 K/uL (ref 0.0–0.5)
Eosinophils Relative: 2 %
Immature Granulocytes: 0 %
Lymphocytes Relative: 23 %
Lymphs Abs: 1.7 K/uL (ref 0.7–4.0)
Monocytes Absolute: 0.4 K/uL (ref 0.1–1.0)
Monocytes Relative: 5 %
Neutro Abs: 5.3 K/uL (ref 1.7–7.7)
Neutrophils Relative %: 69 %

## 2024-01-02 LAB — RAPID URINE DRUG SCREEN, HOSP PERFORMED
Amphetamines: NOT DETECTED
Barbiturates: NOT DETECTED
Benzodiazepines: NOT DETECTED
Cocaine: NOT DETECTED
Opiates: NOT DETECTED
Tetrahydrocannabinol: NOT DETECTED

## 2024-01-02 LAB — I-STAT CHEM 8, ED
BUN: 20 mg/dL (ref 6–20)
Calcium, Ion: 1.04 mmol/L — ABNORMAL LOW (ref 1.15–1.40)
Chloride: 105 mmol/L (ref 98–111)
Creatinine, Ser: 0.9 mg/dL (ref 0.44–1.00)
Glucose, Bld: 219 mg/dL — ABNORMAL HIGH (ref 70–99)
HCT: 42 % (ref 36.0–46.0)
Hemoglobin: 14.3 g/dL (ref 12.0–15.0)
Potassium: 6 mmol/L — ABNORMAL HIGH (ref 3.5–5.1)
Sodium: 138 mmol/L (ref 135–145)
TCO2: 27 mmol/L (ref 22–32)

## 2024-01-02 LAB — COMPREHENSIVE METABOLIC PANEL WITH GFR
ALT: 17 U/L (ref 0–44)
AST: 12 U/L — ABNORMAL LOW (ref 15–41)
Albumin: 3.2 g/dL — ABNORMAL LOW (ref 3.5–5.0)
Alkaline Phosphatase: 71 U/L (ref 38–126)
Anion gap: 11 (ref 5–15)
BUN: 10 mg/dL (ref 6–20)
CO2: 25 mmol/L (ref 22–32)
Calcium: 8.9 mg/dL (ref 8.9–10.3)
Chloride: 103 mmol/L (ref 98–111)
Creatinine, Ser: 0.86 mg/dL (ref 0.44–1.00)
GFR, Estimated: >60 mL/min (ref >60)
Glucose, Bld: 199 mg/dL — ABNORMAL HIGH (ref 70–99)
Potassium: 3.5 mmol/L (ref 3.5–5.1)
Sodium: 139 mmol/L (ref 135–145)
Total Bilirubin: 0.3 mg/dL (ref 0.0–1.2)
Total Protein: 6.3 g/dL — ABNORMAL LOW (ref 6.5–8.1)

## 2024-01-02 LAB — CBG MONITORING, ED
Glucose-Capillary: 228 mg/dL — ABNORMAL HIGH (ref 70–99)
Glucose-Capillary: 179 mg/dL — ABNORMAL HIGH (ref 70–99)
Glucose-Capillary: 185 mg/dL — ABNORMAL HIGH (ref 70–99)

## 2024-01-02 LAB — APTT: aPTT: 30 s (ref 24–36)

## 2024-01-02 LAB — ETHANOL: Alcohol, Ethyl (B): <15 mg/dL (ref <15)

## 2024-01-02 LAB — HIV ANTIBODY (ROUTINE TESTING W REFLEX): HIV Screen 4th Generation wRfx: NONREACTIVE

## 2024-01-02 MED ORDER — ONDANSETRON HCL 4 MG/2ML IJ SOLN: 4.0000 mg | Freq: Four times a day (QID) | INTRAMUSCULAR | Status: DC | PRN

## 2024-01-02 MED ORDER — MELATONIN 5 MG PO TABS
5.0000 mg | ORAL_TABLET | Freq: Every evening | ORAL | Status: DC | PRN
  Administered 2024-01-02: 5 mg via ORAL
  Filled 2024-01-02: qty 1

## 2024-01-02 MED ORDER — ALBUTEROL SULFATE (2.5 MG/3ML) 0.083% IN NEBU: 2.5000 mg | INHALATION_SOLUTION | RESPIRATORY_TRACT | Status: DC | PRN

## 2024-01-02 MED ORDER — POLYETHYLENE GLYCOL 3350 17 G PO PACK: 17.0000 g | PACK | Freq: Every day | ORAL | Status: DC | PRN

## 2024-01-02 MED ORDER — IOHEXOL 350 MG/ML SOLN
75.0000 mL | Freq: Once | INTRAVENOUS | Status: AC | PRN
  Administered 2024-01-02: 75 mL via INTRAVENOUS

## 2024-01-02 MED ORDER — SODIUM ZIRCONIUM CYCLOSILICATE 10 G PO PACK
10.0000 g | PACK | Freq: Two times a day (BID) | ORAL | Status: DC
  Administered 2024-01-02: 10 g via ORAL
  Filled 2024-01-02: qty 1

## 2024-01-02 MED ORDER — ENOXAPARIN SODIUM 80 MG/0.8ML IJ SOSY
80.0000 mg | PREFILLED_SYRINGE | INTRAMUSCULAR | Status: DC
  Administered 2024-01-02: 80 mg via SUBCUTANEOUS
  Filled 2024-01-02 (×3): qty 0.8

## 2024-01-02 MED ORDER — ONDANSETRON HCL 4 MG PO TABS: 4.0000 mg | ORAL_TABLET | Freq: Four times a day (QID) | ORAL | Status: DC | PRN

## 2024-01-02 MED ORDER — HYDRALAZINE HCL 20 MG/ML IJ SOLN: 10.0000 mg | Freq: Four times a day (QID) | INTRAMUSCULAR | Status: DC | PRN

## 2024-01-02 MED ORDER — ASPIRIN 81 MG PO TBEC
81.0000 mg | DELAYED_RELEASE_TABLET | Freq: Every day | ORAL | Status: DC
  Administered 2024-01-02 – 2024-01-03 (×2): 81 mg via ORAL
  Filled 2024-01-02 (×2): qty 1

## 2024-01-02 MED ORDER — HYDRALAZINE HCL 20 MG/ML IJ SOLN
10.0000 mg | Freq: Four times a day (QID) | INTRAMUSCULAR | Status: DC | PRN
  Administered 2024-01-03: 10 mg via INTRAVENOUS
  Filled 2024-01-02: qty 1

## 2024-01-02 MED ORDER — HYDRALAZINE HCL 20 MG/ML IJ SOLN
20.0000 mg | Freq: Once | INTRAMUSCULAR | Status: AC
  Administered 2024-01-02: 20 mg via INTRAVENOUS
  Filled 2024-01-02: qty 1

## 2024-01-02 MED ORDER — TICAGRELOR 90 MG PO TABS
90.0000 mg | ORAL_TABLET | Freq: Two times a day (BID) | ORAL | Status: DC
  Administered 2024-01-02 – 2024-01-03 (×3): 90 mg via ORAL
  Filled 2024-01-02 (×3): qty 1

## 2024-01-02 MED ORDER — ACETAMINOPHEN 325 MG PO TABS: 650.0000 mg | ORAL_TABLET | Freq: Four times a day (QID) | ORAL | Status: DC | PRN

## 2024-01-02 MED ORDER — INSULIN ASPART 100 UNIT/ML IJ SOLN
0.0000 [IU] | Freq: Three times a day (TID) | INTRAMUSCULAR | Status: DC
  Administered 2024-01-02: 3 [IU] via SUBCUTANEOUS
  Administered 2024-01-03: 5 [IU] via SUBCUTANEOUS
  Administered 2024-01-03: 3 [IU] via SUBCUTANEOUS

## 2024-01-02 MED ORDER — STROKE: EARLY STAGES OF RECOVERY BOOK
Freq: Once | Status: AC
  Administered 2024-01-03: 1
  Filled 2024-01-02: qty 1

## 2024-01-02 MED ORDER — ACETAMINOPHEN 650 MG RE SUPP: 650.0000 mg | Freq: Four times a day (QID) | RECTAL | Status: DC | PRN

## 2024-01-02 NOTE — H&P (Addendum)
 History and Physical    Patient: Debra [name] FMW:[id number] DOB: 12/17/1975 DOA: 01/02/2024 DOS: the patient was seen and examined on 01/02/2024 PCP: Rollene Almarie LABOR, MD  Patient coming from: Home  Chief Complaint:  Chief Complaint  Patient presents with   Code Stroke   HPI: Debra [name] is a 48 y.o. female with medical history significant of prior CVA in 2019 (left MCA stent by IR), HTN, HLD,-presented to the hospital for sudden onset of aphasia.  Per patient-she was at work-when she had sudden onset  speech difficulty at work.  Her coworkers noted that she was having a delay in her speech.  EMS was activated-who reported that the patient had difficulty spelling words and formulating full sentences.  Patient denies any focal weakness on one side of her body.  She was brought to the ED-where neuroimaging confirmed acute CVA.  Neurology was consulted-and the hospitalist service was asked to admit this patient.  Per patient history-she has been mostly compliant with her medications-although takes but only once a day instead of twice a day.  During my evaluation-as patient's speech has already improved-and was able to speak in clear sentences/more fluently.  No fever No headache No chest pain/shortness of breath No nausea/vomiting or diarrhea No abdominal pain No dysuria/hematuria No hematochezia/melena.   Review of Systems: As mentioned in the history of present illness. All other systems reviewed and are negative. Past Medical History:  Diagnosis Date   Allergic rhinitis, cause unspecified    Arthritis    Asthma    Diabetes mellitus type II 08/2009 dx   Endometriosis    Hyperlipemia    Hypertension    Knee pain, right    DJD, post traumatic with repeat falls   Lactose intolerance    Morbid obesity (HCC)    s/p lab band 09/2010   Osteoarthritis    Ovarian cyst    right s/p resection June 2013   Sleep apnea    wears cpap   Stroke [facility]) 2019   Past Surgical  History:  Procedure Laterality Date   ABDOMINAL HYSTERECTOMY  05/14/12   [place] [facility]   COLONOSCOPY WITH PROPOFOL  N/A 01/17/2022   Procedure: COLONOSCOPY WITH PROPOFOL ;  Surgeon: Charlanne Groom, MD;  Location: [facility];  Service: Gastroenterology;  Laterality: N/A;   IR ANGIO INTRA EXTRACRAN SEL COM CAROTID INNOMINATE BILAT MOD SED  07/04/2017   IR ANGIO INTRA EXTRACRAN SEL COM CAROTID INNOMINATE UNI R MOD SED  08/26/2019   IR ANGIO VERTEBRAL SEL VERTEBRAL BILAT MOD SED  07/04/2017   IR ANGIO VERTEBRAL SEL VERTEBRAL BILAT MOD SED  08/26/2019   IR INTRA CRAN STENT  07/07/2017   IR RADIOLOGIST EVAL & MGMT  08/13/2017   IR US  GUIDE VASC ACCESS RIGHT  08/26/2019   LAPAROSCOPIC GASTRIC BANDING  10/02/2010   start weight 349#   RADIOLOGY WITH ANESTHESIA N/A 07/07/2017   Procedure: STENTING;  Surgeon: Dolphus Carrion, MD;  Location: [facility] OR;  Service: Radiology;  Laterality: N/A;   SALPINGOOPHORECTOMY  10/08/11   right   Social History:  reports that she has never smoked. She has never used smokeless tobacco. She reports that she does not drink alcohol and does not use drugs.  Allergies  Allergen Reactions   Peanut-Containing Drug Products Anaphylaxis   Strawberry (Diagnostic) Anaphylaxis   Morphine  Hives   Augmentin  [Amoxicillin -Pot Clavulanate] Hives and Rash    To bilat post hands only   Influenza Vaccines Itching and Rash   Metformin  And Related  Diarrhea    Family History  Problem Relation Age of Onset   Heart disease Mother    Hypertension Mother    Diabetes Mother    Stroke Father 31   Diabetes Father    Hypertension Father    Hyperlipidemia Father    Heart disease Father    Kidney disease Father    Cancer Father    Liver disease Father    Obesity Father    Colon cancer Neg Hx    Stomach cancer Neg Hx    Esophageal cancer Neg Hx    Rectal cancer Neg Hx     Prior to Admission medications   Medication Sig Start Date End Date Taking? Authorizing Provider  albuterol   (PROAIR  HFA) 108 (90 Base) MCG/ACT inhaler Inhale 2 puffs into the lungs every 4 (four) hours as needed for wheezing or shortness of breath. 05/27/23   [name] Corean CROME, FNP  albuterol  (PROVENTIL ) (2.5 MG/3ML) 0.083% nebulizer solution Take 3 mLs (2.5 mg total) by nebulization every 6 (six) hours as needed for wheezing or shortness of breath. 12/03/22   Rollene Almarie LABOR, MD  aspirin  EC 81 MG tablet Take 81 mg by mouth daily. Swallow whole.    [provider]  celecoxib  (CELEBREX ) 200 MG capsule Take 1 capsule (200 mg total) by mouth 2 (two) times daily as needed. 09/30/23   Silver Wonda LABOR, PA  EPINEPHrine  0.3 mg/0.3 mL IJ SOAJ injection Inject 0.3 mLs (0.3 mg total) into the muscle as needed for anaphylaxis. 03/02/19   Rollene Almarie LABOR, MD  fexofenadine (ALLEGRA) 180 MG tablet Take 180 mg by mouth daily.     [provider]  fluconazole  (DIFLUCAN ) 150 MG tablet Take 1 tablet (150 mg total) by mouth every 3 (three) days. 04/28/23   Rollene Almarie LABOR, MD  fluticasone  (FLONASE ) 50 MCG/ACT nasal spray Place 2 sprays into both nostrils daily as needed for allergies. 12/03/22   Rollene Almarie LABOR, MD  glimepiride (AMARYL) 4 MG tablet Take 4 mg by mouth daily with breakfast. 05/31/21   [provider]  ibuprofen  (ADVIL ) 800 MG tablet TAKE 1 TABLET(800 MG) BY MOUTH EVERY 8 HOURS AS NEEDED FOR MODERATE PAIN 09/17/23   Rollene Almarie LABOR, MD  losartan -hydrochlorothiazide  (HYZAAR) 100-12.5 MG tablet Take 1 tablet by mouth daily. Patient not taking: Reported on 12/04/2023 12/03/22   Rollene Almarie LABOR, MD  Misc Natural Products (ELDERBERRY IMMUNE COMPLEX PO) Take 2 each by mouth daily.    [provider]  montelukast  (SINGULAIR ) 10 MG tablet Take 1 tablet (10 mg total) by mouth at bedtime. Patient taking differently: Take 10 mg by mouth at bedtime as needed. 03/01/19   Rollene Almarie LABOR, MD  Multiple Vitamin (MULTIVITAMIN) tablet Take 1 tablet by mouth  daily.    [provider]  Multiple Vitamins-Minerals (ONE A DAY IMMUNITY DEFENSE PO) Take 1 tablet by mouth daily.    [provider]  olopatadine  (PATANOL) 0.1 % ophthalmic solution Place 1 drop into both eyes 2 (two) times daily as needed for allergies.  08/08/12   [provider]  senna (SENOKOT) 8.6 MG TABS tablet Take 1 tablet by mouth daily.    [provider]  ticagrelor  (BRILINTA ) 90 MG TABS tablet TAKE 1/2 TABLET(45 MG) BY MOUTH TWICE DAILY 12/25/23   Rollene Almarie LABOR, MD  tirzepatide  (MOUNJARO ) 5 MG/0.5ML Pen Inject 5 mg into the skin once a week. Patient taking differently: Inject 2.5 mg into the skin once a week. 11/20/23  Joshua Debby CROME, MD  Vitamin D , Ergocalciferol , (DRISDOL ) 1.25 MG (50000 UNIT) CAPS capsule Take 1 capsule (50,000 Units total) by mouth every 7 (seven) days. 08/27/21   Rollene Almarie LABOR, MD    Physical Exam: Vitals:   01/02/24 1245 01/02/24 1300 01/02/24 1315 01/02/24 1445  BP: (!) 193/75 (!) 191/76 (!) 196/75 (!) 192/84  Pulse: 77 80 77 65  Resp: (!) 21 (!) 22 17 16   Temp:  98.6 F (37 C)    TempSrc:  Oral    SpO2: 100% 100% 100% 100%   Gen Exam:Alert awake-not in any distress-speech with only slight dysarthria-already improved per patient. HEENT:atraumatic, normocephalic Chest: B/L clear to auscultation anteriorly CVS:S1S2 regular Abdomen:soft non tender, non distended Extremities:no edema Neurology: Approximately 5/5 all over-symmetrical. Skin: no rash  Data Reviewed:    Latest Ref Rng & Units 01/02/2024   12:04 PM 01/02/2024   12:00 PM 11/24/2023    3:58 PM  CBC  WBC 4.0 - 10.5 K/uL  7.7  8.5   Hemoglobin 12.0 - 15.0 g/dL 85.6  86.9  86.7   Hematocrit 36.0 - 46.0 % 42.0  42.5  41.8   Platelets 150 - 400 K/uL  218  277.0         Latest Ref Rng & Units 01/02/2024   12:04 PM 11/24/2023    3:58 PM 08/21/2021   11:03 AM  BMP  Glucose 70 - 99 mg/dL 780  94  751   BUN 6 - 20 mg/dL 20  13  14    Creatinine  0.44 - 1.00 mg/dL 9.09  9.19  8.74   Sodium 135 - 145 mmol/L 138  143  139   Potassium 3.5 - 5.1 mmol/L 6.0  3.9  3.7   Chloride 98 - 111 mmol/L 105  104  103   CO2 19 - 32 mEq/L  31  29   Calcium  8.4 - 10.5 mg/dL  9.4  8.6     Twelve-lead EKG: Sinus rhythm  MRI brain: Left basal ganglia infarct.  CTA head/neck: Patent left MCA stent-no significant extracranial atherosclerosis.  Assessment and Plan: Acute left basal ganglia CVA Probably secondary to small vessel disease Unfortunately-patient not very compliant to her antiplatelet agents-only takes Brilinta  once a day Per neurology recommendations-patient will be placed on aspirin /Brilinta  Echo, PT/OT/SLP evaluation will be ordered Will await further recommendations from neurology/stroke MD.  Hyperkalemia Unclear if this is reliable-this is on i-STAT chemistry Awaiting repeat electrolytes-I have changed it to stat Starting Lokelma   HTN Allow permissive hypertension Hold all antihypertensives  DM-2 Recent A1c 8.5 on 8/4-doubt any utility in repeating at this point Hold oral hypoglycemics/Mounjaro  Place on SSI  HLD Obtain fasting lipids Unclear if patient is taking statin at home-Will resume/restart based on LDL levels  OSA Resume CPAP  Morbid obesity   Advance Care Planning:   Code Status: Prior   Consults: Neurology  Family Communication: brother/sister at bedside  Severity of Illness: The appropriate patient status for this patient is INPATIENT. Inpatient status is judged to be reasonable and necessary in order to provide the required intensity of service to ensure the patient's safety. The patient's presenting symptoms, physical exam findings, and initial radiographic and laboratory data in the context of their chronic comorbidities is felt to place them at high risk for further clinical deterioration. Furthermore, it is not anticipated that the patient will be medically stable for discharge from the hospital  within 2 midnights of admission.   * I certify that at  the point of admission it is my clinical judgment that the patient will require inpatient hospital care spanning beyond 2 midnights from the point of admission due to high intensity of service, high risk for further deterioration and high frequency of surveillance required.*  Author: Donalda Applebaum, MD 01/02/2024 4:22 PM  For on call review [email or web address].

## 2024-01-02 NOTE — ED Notes (Signed)
 Pt in MRI, unable to do NIH at this time.

## 2024-01-02 NOTE — ED Notes (Signed)
 Neurology at bedside updated pt & family.

## 2024-01-02 NOTE — Code Documentation (Signed)
 Stroke Response Nurse Documentation Code Documentation  Debra [name] is a 48 y.o. female arriving to [facility]  via [facility] on 01/02/2024 with past medical hx of stroke, HTN. On aspirin  81 mg daily and Brilinta  (ticagrelor ) 90 mg bid. Code stroke was activated by EMS.   Patient from work where she was LKW at 1100 and now complaining of aphasia. Per EMS, patient was at work when she had a witnessed acute onset of aphasia. Patient stated she has not been taking her BP medication but could not verbalize why.  Stroke team at the bedside on patient arrival. Labs drawn and patient cleared for CT by Dr. Mannie. Patient to CT with team. NIHSS 1, see documentation for details and code stroke times. Patient with Expressive aphasia  on exam. The following imaging was completed:  CT Head and CTA. Patient is not a candidate for IV Thrombolytic due to too mild to treat per MD. Patient is not a candidate for IR due to no LVO noted on imaging per MD.   Care Plan: VS/NIHSS q67min until out of window at 1530, then q2hr x12hr, than q4hr.   Bedside handoff with ED RN Carlyon.    Debra [name]  Stroke Response RN

## 2024-01-02 NOTE — Consult Note (Signed)
 NEUROLOGY CONSULT NOTE   Date of service: January 02, 2024 Patient Name: Debra [name] MRN:  [id number] DOB:  13-Jul-1975 Chief Complaint: Code Stroke Requesting Provider: No att. providers found  History of Present Illness  Debra [name] is a 48 y.o. female with hx of stroke on brilinta  and ASA, HTN, sleep apnea, hyperlipidemia, uncontrolled diabetes who was at work when she developed a sudden onset of aphasia. EMS was called. She did forget to take her blood pressure medications this morning. BP on arrival 241/101 and glucose 228. Last known normal was at 11:00am concurrent with onset of symptoms. Co-workers noted she was having delay in speech. EMS reported that pt had difficulty spelling words and formulating full sentences without difficulty. Pt reported to EMS that her current symptoms are similar to symptoms she had with a previous stroke. Pt denies any HA or changes in vision.  She is currently following with Dr. Chalice for sleep apnea and has seen Dr. Rosemarie and Harlene at [facility].   History of stroke 2019 Patient presented with left MCA syndrome in 2019 but had small perfusion deficit and did not undergo an emergent intervention.  She did have a cerebral angiogram and was found to have left MCA inferior division >75% stenosis and right M1 80% stenosis, no vasculitis but atherosclerosis.  She underwent a stent placement with Dr.Deveshwar and was put on aspirin  and Brilinta   LDL 129, and A1c 8.5. UDS negative. Hypercoagulable work-up negative. She received left MCA stenting on 07/07/2017. Her symptoms improved and was discharged home on 07/08/2017 with home health PT/speech. Home medication including aspirin , Brilinta  and Lipitor  80. Per most recent notes from [facility] pt did not have any neurological deficits in 2020.  LKW: 1100 Modified rankin score: 1-No significant post stroke disability and can perform usual duties with stroke symptoms IV Thrombolysis: No, too mild to treat EVT: No, no LVO    NIHSS components Score: Comment  1a Level of Conscious 0[x]  1[]  2[]  3[]      1b LOC Questions 0[x]  1[]  2[]       1c LOC Commands 0[x]  1[]  2[]       2 Best Gaze 0[x]  1[]  2[]       3 Visual 0[x]  1[]  2[]  3[]      4 Facial Palsy 0[x]  1[]  2[]  3[]      5a Motor Arm - left 0[x]  1[]  2[]  3[]  4[]  UN[]    5b Motor Arm - Right 0[x]  1[]  2[]  3[]  4[]  UN[]    6a Motor Leg - Left 0[x]  1[]  2[]  3[]  4[]  UN[]    6b Motor Leg - Right 0[x]  1[]  2[]  3[]  4[]  UN[]    7 Limb Ataxia 0[x]  1[]  2[]  UN[]      8 Sensory 0[x]  1[]  2[]  UN[]      9 Best Language 0[]  1[x]  2[]  3[]      10 Dysarthria 0[x]  1[]  2[]  UN[]      11 Extinct. and Inattention 0[x]  1[]  2[]       TOTAL:1       ROS  Comprehensive ROS performed and pertinent positives documented in HPI   Past History   Past Medical History:  Diagnosis Date   Allergic rhinitis, cause unspecified    Arthritis    Asthma    Diabetes mellitus type II 08/2009 dx   Endometriosis    Hyperlipemia    Hypertension    Knee pain, right    DJD, post traumatic with repeat falls   Lactose intolerance    Morbid obesity (HCC)    s/p lab band 09/2010  Osteoarthritis    Ovarian cyst    right s/p resection June 2013   Sleep apnea    wears cpap   Stroke [facility]) 2019    Past Surgical History:  Procedure Laterality Date   ABDOMINAL HYSTERECTOMY  05/14/12   [place]   COLONOSCOPY WITH PROPOFOL  N/A 01/17/2022   Procedure: COLONOSCOPY WITH PROPOFOL ;  Surgeon: Charlanne Groom, MD;  Location: [facility];  Service: Gastroenterology;  Laterality: N/A;   IR ANGIO INTRA EXTRACRAN SEL COM CAROTID INNOMINATE BILAT MOD SED  07/04/2017   IR ANGIO INTRA EXTRACRAN SEL COM CAROTID INNOMINATE UNI R MOD SED  08/26/2019   IR ANGIO VERTEBRAL SEL VERTEBRAL BILAT MOD SED  07/04/2017   IR ANGIO VERTEBRAL SEL VERTEBRAL BILAT MOD SED  08/26/2019   IR INTRA CRAN STENT  07/07/2017   IR RADIOLOGIST EVAL & MGMT  08/13/2017   IR US  GUIDE VASC ACCESS RIGHT  08/26/2019   LAPAROSCOPIC GASTRIC BANDING  10/02/2010    start weight 349#   RADIOLOGY WITH ANESTHESIA N/A 07/07/2017   Procedure: STENTING;  Surgeon: Dolphus Carrion, MD;  Location: [facility] OR;  Service: Radiology;  Laterality: N/A;   SALPINGOOPHORECTOMY  10/08/11   right    Family History: Family History  Problem Relation Age of Onset   Heart disease Mother    Hypertension Mother    Diabetes Mother    Stroke Father 36   Diabetes Father    Hypertension Father    Hyperlipidemia Father    Heart disease Father    Kidney disease Father    Cancer Father    Liver disease Father    Obesity Father    Colon cancer Neg Hx    Stomach cancer Neg Hx    Esophageal cancer Neg Hx    Rectal cancer Neg Hx     Social History  reports that she has never smoked. She has never used smokeless tobacco. She reports that she does not drink alcohol and does not use drugs.  Allergies  Allergen Reactions   Nutritional Supplements Anaphylaxis and Rash    NUT allergy    Dust Mite Extract     Respiratory problems    Metformin  And Related Diarrhea   Morphine  Hives   Augmentin  [Amoxicillin -Pot Clavulanate] Rash    To bilat post hands only   Influenza Vaccines Itching and Rash    Medications  No current facility-administered medications for this encounter.  Current Outpatient Medications:    albuterol  (PROAIR  HFA) 108 (90 Base) MCG/ACT inhaler, Inhale 2 puffs into the lungs every 4 (four) hours as needed for wheezing or shortness of breath., Disp: 18 g, Rfl: 2   albuterol  (PROVENTIL ) (2.5 MG/3ML) 0.083% nebulizer solution, Take 3 mLs (2.5 mg total) by nebulization every 6 (six) hours as needed for wheezing or shortness of breath., Disp: 75 mL, Rfl: 3   aspirin  EC 81 MG tablet, Take 81 mg by mouth daily. Swallow whole., Disp: , Rfl:    blood glucose meter kit and supplies, Dispense based on patient and insurance preference. Use up to four times daily as directed. (FOR ICD-9 250.00, 250.01)., Disp: 1 each, Rfl: 0   Blood Glucose Monitoring Suppl (ONE TOUCH  ULTRA 2) w/Device KIT, 1 application  by Does not apply route daily at 4 PM., Disp: 1 kit, Rfl: 0   celecoxib  (CELEBREX ) 200 MG capsule, Take 1 capsule (200 mg total) by mouth 2 (two) times daily as needed., Disp: 30 capsule, Rfl: 0   Continuous Blood Gluc Sensor MISC,  1 each by Does not apply route as directed. Use as directed every 14 days. May dispense FreeStyle [facility] or similar., Disp: 1 each, Rfl: 0   EPINEPHrine  0.3 mg/0.3 mL IJ SOAJ injection, Inject 0.3 mLs (0.3 mg total) into the muscle as needed for anaphylaxis., Disp: 1 each, Rfl: 1   fexofenadine (ALLEGRA) 180 MG tablet, Take 180 mg by mouth daily. , Disp: , Rfl:    fluconazole  (DIFLUCAN ) 150 MG tablet, Take 1 tablet (150 mg total) by mouth every 3 (three) days., Disp: 3 tablet, Rfl: 0   fluticasone  (FLONASE ) 50 MCG/ACT nasal spray, Place 2 sprays into both nostrils daily as needed for allergies., Disp: 48 g, Rfl: 3   glimepiride (AMARYL) 4 MG tablet, Take 4 mg by mouth daily with breakfast., Disp: , Rfl:    glucose blood test strip, Used to check blood sugars up to four times daily as needed, Disp: 200 each, Rfl: 3   ibuprofen  (ADVIL ) 800 MG tablet, TAKE 1 TABLET(800 MG) BY MOUTH EVERY 8 HOURS AS NEEDED FOR MODERATE PAIN, Disp: 30 tablet, Rfl: 0   Lancets (ONETOUCH DELICA PLUS LANCET30G) MISC, 1 application. by Does not apply route daily., Disp: 100 each, Rfl: 11   losartan -hydrochlorothiazide  (HYZAAR) 100-12.5 MG tablet, Take 1 tablet by mouth daily. (Patient not taking: Reported on 12/04/2023), Disp: 90 tablet, Rfl: 3   Misc Natural Products (ELDERBERRY IMMUNE COMPLEX PO), Take 2 each by mouth daily., Disp: , Rfl:    montelukast  (SINGULAIR ) 10 MG tablet, Take 1 tablet (10 mg total) by mouth at bedtime. (Patient taking differently: Take 10 mg by mouth at bedtime as needed.), Disp: 30 tablet, Rfl: 3   Multiple Vitamin (MULTIVITAMIN) tablet, Take 1 tablet by mouth daily., Disp: , Rfl:    Multiple Vitamins-Minerals (ONE A DAY  IMMUNITY DEFENSE PO), Take 1 tablet by mouth daily., Disp: , Rfl:    olopatadine  (PATANOL) 0.1 % ophthalmic solution, Place 1 drop into both eyes 2 (two) times daily as needed for allergies. , Disp: , Rfl:    senna (SENOKOT) 8.6 MG TABS tablet, Take 1 tablet by mouth daily., Disp: , Rfl:    ticagrelor  (BRILINTA ) 90 MG TABS tablet, TAKE 1/2 TABLET(45 MG) BY MOUTH TWICE DAILY, Disp: 90 tablet, Rfl: 1   tirzepatide  (MOUNJARO ) 5 MG/0.5ML Pen, Inject 5 mg into the skin once a week. (Patient taking differently: Inject 2.5 mg into the skin once a week.), Disp: 2 mL, Rfl: 0   Vitamin D , Ergocalciferol , (DRISDOL ) 1.25 MG (50000 UNIT) CAPS capsule, Take 1 capsule (50,000 Units total) by mouth every 7 (seven) days., Disp: 12 capsule, Rfl: 0  Vitals   There were no vitals filed for this visit.  There is no height or weight on file to calculate BMI.   Physical Exam   General: Laying comfortably in bed; in no acute distress.  HENT: Normal oropharynx and mucosa. Normal external appearance of ears and nose.  Neck: Supple, no pain or tenderness  CV: No JVD. No peripheral edema.  Pulmonary: Symmetric Chest rise. Normal respiratory effort.  Abdomen: Soft to touch, non-tender.  Ext: No cyanosis, edema, or deformity  Skin: No rash. Normal palpation of skin.   Musculoskeletal: Normal digits and nails by inspection. No clubbing.   Neurologic Examination  Mental status/Cognition: Alert, oriented to self, place, month and year, good attention.  Speech/language: Non fluent get stuck on some words, comprehension intact, names 5 out of 6 objects, able to read. repetition intact.  Cranial nerves:  CN II Pupils equal and reactive to light, no VF deficits    CN III,IV,VI EOM intact, no gaze preference or deviation, no nystagmus    CN V normal sensation in V1, V2, and V3 segments bilaterally    CN VII no asymmetry, no nasolabial fold flattening    CN VIII normal hearing to speech    CN IX & X normal palatal  elevation, no uvular deviation    CN XI 5/5 head turn and 5/5 shoulder shrug bilaterally    CN XII midline tongue protrusion    Motor:  Muscle bulk: normal, tone normal, pronator drift none tremor none Mvmt Root Nerve  Muscle Right Left Comments  SA C5/6 Ax Deltoid 5 5   EF C5/6 Mc Biceps 5 5   EE C6/7/8 Rad Triceps 5 5   WF C6/7 Med FCR     WE C7/8 PIN ECU     F Ab C8/T1 U ADM/FDI 5 5   HF L1/2/3 Fem Illopsoas 5 5   KE L2/3/4 Fem Quad 5 5   DF L4/5 D Peron Tib Ant 5 5   PF S1/2 Tibial Grc/Sol 5 5    Sensation:  Light touch Intact throughout   Pin prick    Temperature    Vibration   Proprioception    Coordination/Complex Motor:  - Finger to Nose intact BL - Heel to shin intact BL - Rapid alternating movement are normal - Gait: deferred.        Labs/Imaging/Neurodiagnostic studies   CBC: No results for input(s): WBC, NEUTROABS, HGB, HCT, MCV, PLT in the last 168 hours. Basic Metabolic Panel:  Lab Results  Component Value Date   NA 143 11/24/2023   K 3.9 11/24/2023   CO2 31 11/24/2023   GLUCOSE 94 11/24/2023   BUN 13 11/24/2023   CREATININE 0.80 11/24/2023   CALCIUM  9.4 11/24/2023   GFRNONAA 69 12/15/2019   GFRAA 80 12/15/2019   Lipid Panel:  Lab Results  Component Value Date   LDLCALC 107 (H) 11/24/2023   HgbA1c:  Lab Results  Component Value Date   HGBA1C 8.5 (H) 11/24/2023   Urine Drug Screen:     Component Value Date/Time   LABOPIA NONE DETECTED 07/02/2017 0254   COCAINSCRNUR NONE DETECTED 07/02/2017 0254   LABBENZ NONE DETECTED 07/02/2017 0254   AMPHETMU NONE DETECTED 07/02/2017 0254   THCU NONE DETECTED 07/02/2017 0254   LABBARB NONE DETECTED 07/02/2017 0254    Alcohol Level No results found for: [facility] INR  Lab Results  Component Value Date   INR 0.9 08/26/2019   APTT  Lab Results  Component Value Date   APTT 72 (H) 07/07/2017   AED levels: No results found for: PHENYTOIN, ZONISAMIDE, LAMOTRIGINE,  LEVETIRACETA  CT Head without contrast(Personally reviewed):  1. Age indeterminate left frontal lobe white matter and left basal ganglia hypodensity, new since 2020. Consider acute or subacute perforator territory ischemia. No associated hemorrhage or mass effect.   2. Underlying chronic left MCA vascular stent, chronic left MCA/PCA watershed infarcts and encephalomalacia. ASPECTS 10.  CT angio Head and Neck with contrast(Personally reviewed):  1. Negative for large vessel occlusion. Left MCA vascular stent remains patent, and Left MCA branches appear stable from 2020 CTA. Likewise, Severe chronic stenosis of the distal Right MCA M1 segment and the right MCA branches appear stable.   2. Otherwise mild atherosclerosis most visible at the ICA siphons. No significant extracranial atherosclerosis.  MRI Brain(Personally reviewed): pending  ASSESSMENT   Shainna  L Hillenburg is a 48 y.o. female with a past medical history of stroke on Brilinta  and aspirin , hypertension, hyperlipidemia, sleep apnea followed by [facility] presenting with an acute onset of mild aphasia.  She does have a right M1 stent that was placed in 2019 by Dr. Dolphus.  She is currently on aspirin  and Brilinta , however she did not take this this morning.  She [name] receive a dose in the emergency department as well as a one-time dose of hydralazine  for blood pressure greater than 220.  Stat MRI brain without contrast ordered.  If this is positive for an acute stroke she [name] need to be admitted for stroke workup.  There is also a concern for hypertensive encephalopathy causing recrudescence of her symptoms.  Reportedly she has not taken her Hyzaar for unknown reasons.  She is typically compliant with her other medications.  RECOMMENDATIONS   If MRI is positive for stroke she [name] need the following workup - HgbA1c, fasting lipid panel - MRI of the brain without contrast - Frequent neuro checks -  Echocardiogram - Prophylactic therapy-Antiplatelet med: Brilinta  and aspirin  - Risk factor modification - Telemetry monitoring - PT consult, OT consult, Speech consult - Stroke team to follow  ____________________________________________________________    Signed, Jorene Last, NP [facility]   NEUROHOSPITALIST ADDENDUM Performed a face to face diagnostic evaluation.   I have reviewed the contents of history and physical exam as documented by PA/ARNP/Resident and agree with above documentation.  I have discussed and formulated the above plan as documented. Edits to the note have been made as needed.  Impression/Key exam findings/Plan: Mild aphasia noted on exam today. Was able to name 5 out of 6 objects, describe aphasia picture without any issues. Has decreased fluency and gets stuck on some words. Either recrudescence of prior stroke symptoms vs new acute infarct.  Closely monitor while within tnkase window and if no worsening, recommend MRI Brain. If MRI brain shows a new stroke, medicine admission with stroke workup.  Jobani Sabado, MD [facility] [phone]   If 7pm to 7am, please call on call as listed on AMION.

## 2024-01-02 NOTE — ED Triage Notes (Signed)
 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 Co-workers noticed onset of confusion at 1100 & aphasia, no physical deficits noted. EMS reports that she would use appropriate words but have difficulty getting her thoughts about or would begin speaking then abandon the thought. Hx of stroke & HTN denies taking her BP meds & was 170/122, was >200 SBP upon arrival to ED. CBG 123, 58 bpm, 12L unremarkable, Pt denies HA, dizziness.

## 2024-01-02 NOTE — ED Provider Notes (Signed)
 [place] EMERGENCY DEPARTMENT AT [facility] Provider Note  CSN: [id number] Arrival date & time: 01/02/24 1156  Chief Complaint(s) Code Stroke  HPI Debra [name] is a 48 y.o. female who is here today as a stroke activation.  Patient with history of a prior MCA requiring stenting.  She was at work, had sudden onset of aphasia.  She has been compliant with her antiplatelet therapy.   Past Medical History Past Medical History:  Diagnosis Date   Allergic rhinitis, cause unspecified    Arthritis    Asthma    Diabetes mellitus type II 08/2009 dx   Endometriosis    Hyperlipemia    Hypertension    Knee pain, right    DJD, post traumatic with repeat falls   Lactose intolerance    Morbid obesity (HCC)    s/p lab band 09/2010   Osteoarthritis    Ovarian cyst    right s/p resection June 2013   Sleep apnea    wears cpap   Stroke [facility]) 2019   Patient Active Problem List   Diagnosis Date Noted   Lightheadedness 04/14/2019   Allergic reaction 03/02/2019   Menopausal vasomotor syndrome 01/08/2019   OSA (obstructive sleep apnea) 01/08/2019   Hot flashes 10/04/2017   Stenosis of both middle cerebral arteries 07/07/2017   Hyperlipidemia associated with type 2 diabetes mellitus (HCC)    Hx of completed stroke 07/01/2017   Left shoulder pain 05/21/2016   Encounter for general adult medical examination with abnormal findings 10/13/2015   Hypertension 08/26/2011   Hx of laparoscopic gastric banding, 10/02/2010. 11/27/2010   Diabetes mellitus type 2 with complications (HCC) 08/23/2009   Morbid obesity (HCC) 08/22/2009   Allergic rhinitis 08/22/2009   Asthma 08/22/2009   Osteoarthritis 08/22/2009   Home Medication(s) Prior to Admission medications   Medication Sig Start Date End Date Taking? Authorizing Provider  albuterol  (PROAIR  HFA) 108 (90 Base) MCG/ACT inhaler Inhale 2 puffs into the lungs every 4 (four) hours as needed for wheezing or shortness of breath. 05/27/23    [name] Corean LITTIE, FNP  albuterol  (PROVENTIL ) (2.5 MG/3ML) 0.083% nebulizer solution Take 3 mLs (2.5 mg total) by nebulization every 6 (six) hours as needed for wheezing or shortness of breath. 12/03/22   Rollene Almarie LABOR, MD  aspirin  EC 81 MG tablet Take 81 mg by mouth daily. Swallow whole.    [provider]  celecoxib  (CELEBREX ) 200 MG capsule Take 1 capsule (200 mg total) by mouth 2 (two) times daily as needed. 09/30/23   Silver Wonda LABOR, PA  EPINEPHrine  0.3 mg/0.3 mL IJ SOAJ injection Inject 0.3 mLs (0.3 mg total) into the muscle as needed for anaphylaxis. 03/02/19   Rollene Almarie LABOR, MD  fexofenadine (ALLEGRA) 180 MG tablet Take 180 mg by mouth daily.     [provider]  fluconazole  (DIFLUCAN ) 150 MG tablet Take 1 tablet (150 mg total) by mouth every 3 (three) days. 04/28/23   Rollene Almarie LABOR, MD  fluticasone  (FLONASE ) 50 MCG/ACT nasal spray Place 2 sprays into both nostrils daily as needed for allergies. 12/03/22   Rollene Almarie LABOR, MD  glimepiride (AMARYL) 4 MG tablet Take 4 mg by mouth daily with breakfast. 05/31/21   [provider]  ibuprofen  (ADVIL ) 800 MG tablet TAKE 1 TABLET(800 MG) BY MOUTH EVERY 8 HOURS AS NEEDED FOR MODERATE PAIN 09/17/23   Rollene Almarie LABOR, MD  losartan -hydrochlorothiazide  (HYZAAR) 100-12.5 MG tablet Take 1 tablet by mouth daily. Patient not taking: Reported on  12/04/2023 12/03/22   Rollene Almarie LABOR, MD  Misc Natural Products (ELDERBERRY IMMUNE COMPLEX PO) Take 2 each by mouth daily.    [provider]  montelukast  (SINGULAIR ) 10 MG tablet Take 1 tablet (10 mg total) by mouth at bedtime. Patient taking differently: Take 10 mg by mouth at bedtime as needed. 03/01/19   Rollene Almarie LABOR, MD  Multiple Vitamin (MULTIVITAMIN) tablet Take 1 tablet by mouth daily.    [provider]  Multiple Vitamins-Minerals (ONE A DAY IMMUNITY DEFENSE PO) Take 1 tablet by mouth daily.    [provider]   olopatadine  (PATANOL) 0.1 % ophthalmic solution Place 1 drop into both eyes 2 (two) times daily as needed for allergies.  08/08/12   [provider]  senna (SENOKOT) 8.6 MG TABS tablet Take 1 tablet by mouth daily.    [provider]  ticagrelor  (BRILINTA ) 90 MG TABS tablet TAKE 1/2 TABLET(45 MG) BY MOUTH TWICE DAILY 12/25/23   Rollene Almarie LABOR, MD  tirzepatide  (MOUNJARO ) 5 MG/0.5ML Pen Inject 5 mg into the skin once a week. Patient taking differently: Inject 2.5 mg into the skin once a week. 11/20/23   Joshua Debby CROME, MD  Vitamin D , Ergocalciferol , (DRISDOL ) 1.25 MG (50000 UNIT) CAPS capsule Take 1 capsule (50,000 Units total) by mouth every 7 (seven) days. 08/27/21   Rollene Almarie LABOR, MD                                                                                                                                    Past Surgical History Past Surgical History:  Procedure Laterality Date   ABDOMINAL HYSTERECTOMY  05/14/12   [place]   COLONOSCOPY WITH PROPOFOL  N/A 01/17/2022   Procedure: COLONOSCOPY WITH PROPOFOL ;  Surgeon: Charlanne Groom, MD;  Location: [facility];  Service: Gastroenterology;  Laterality: N/A;   IR ANGIO INTRA EXTRACRAN SEL COM CAROTID INNOMINATE BILAT MOD SED  07/04/2017   IR ANGIO INTRA EXTRACRAN SEL COM CAROTID INNOMINATE UNI R MOD SED  08/26/2019   IR ANGIO VERTEBRAL SEL VERTEBRAL BILAT MOD SED  07/04/2017   IR ANGIO VERTEBRAL SEL VERTEBRAL BILAT MOD SED  08/26/2019   IR INTRA CRAN STENT  07/07/2017   IR RADIOLOGIST EVAL & MGMT  08/13/2017   IR US  GUIDE VASC ACCESS RIGHT  08/26/2019   LAPAROSCOPIC GASTRIC BANDING  10/02/2010   start weight 349#   RADIOLOGY WITH ANESTHESIA N/A 07/07/2017   Procedure: STENTING;  Surgeon: Dolphus Carrion, MD;  Location: [facility] OR;  Service: Radiology;  Laterality: N/A;   SALPINGOOPHORECTOMY  10/08/11   right   Family History Family History  Problem Relation Age of Onset   Heart disease Mother    Hypertension  Mother    Diabetes Mother    Stroke Father 23   Diabetes Father    Hypertension Father    Hyperlipidemia Father    Heart disease Father  Kidney disease Father    Cancer Father    Liver disease Father    Obesity Father    Colon cancer Neg Hx    Stomach cancer Neg Hx    Esophageal cancer Neg Hx    Rectal cancer Neg Hx     Social History Social History   Tobacco Use   Smoking status: Never   Smokeless tobacco: Never  Vaping Use   Vaping status: Never Used  Substance Use Topics   Alcohol use: No    Alcohol/week: 0.0 standard drinks of alcohol   Drug use: No   Allergies Nutritional supplements, Dust mite extract, Metformin  and related, Morphine , Augmentin  [amoxicillin -pot clavulanate], and Influenza vaccines  Review of Systems Review of Systems  Physical Exam Vital Signs  I have reviewed the triage vital signs BP (!) 192/84   Pulse 65   Temp 98.6 F (37 C) (Oral)   Resp 16   LMP 07/28/2011   SpO2 100%   Physical Exam Vitals and nursing note reviewed.  Neurological:     Mental Status: She is oriented to person, place, and time.     Cranial Nerves: No cranial nerve deficit.     Motor: No weakness.     Comments: Mild expressive aphasia     ED Results and Treatments Labs (all labs ordered are listed, but only abnormal results are displayed) Labs Reviewed  CBC - Abnormal; Notable for the following components:      Result Value   MCH 25.6 (*)    All other components within normal limits  I-STAT CHEM 8, ED - Abnormal; Notable for the following components:   Potassium 6.0 (*)    Glucose, Bld 219 (*)    Calcium , Ion 1.04 (*)    All other components within normal limits  CBG MONITORING, ED - Abnormal; Notable for the following components:   Glucose-Capillary 228 (*)    All other components within normal limits  ETHANOL  DIFFERENTIAL  RAPID URINE DRUG SCREEN, HOSP PERFORMED  PROTIME-INR  APTT  COMPREHENSIVE METABOLIC PANEL WITH GFR                                                                                                                           Radiology MR BRAIN WO CONTRAST Result Date: 01/02/2024 CLINICAL DATA:  Provided history: Stroke, follow-up. EXAM: MRI HEAD WITHOUT CONTRAST TECHNIQUE: Multiplanar, multiecho pulse sequences of the brain and surrounding structures were obtained without intravenous contrast. COMPARISON:  Non-contrast head CT and CT angiogram head/neck performed earlier today 01/02/2024. Brain MRI 07/03/2017. FINDINGS: Brain: No age-advanced or lobar predominant cerebral atrophy. 2.0 x 1.1 cm acute infarct within the left basal ganglia. Known chronic cortical/subcortical infarcts within the left frontal, temporal, parietal and occipital lobes (MCA territory and MCA/PCA watershed territory). Chronic hemosiderin deposition associated with a chronic infarct at the junction of the left parietal, temporal and occipital lobes. Mild multifocal T2 FLAIR hyperintense signal abnormality elsewhere within the cerebral white matter, nonspecific  but compatible chronic small vessel ischemic disease. Small chronic lacunar infarct within the right corona radiata. Punctate chronic microhemorrhages within the left frontoparietal white matter, and within the right cerebellar hemisphere. No evidence of an intracranial mass. No extra-axial fluid collection. No midline shift. Vascular: See CTA head/neck performed earlier today. Artifact arising from a left middle cerebral artery stent. Skull and upper cervical spine: No focal worrisome marrow lesion. Sinuses/Orbits: No mass or acute finding within the imaged orbits. No significant paranasal sinus disease. Other: 2.2 cm ovoid T1 and T2 hyperintense lesion within the tongue on the right (for instance as seen on series 15, image 26) (series 9, image 11). This lesion demonstrated fat density on the CTA head/neck performed earlier today, and this could reflect a lipoma or dermoid cyst. IMPRESSION: 1. 2.0 x 1.1  cm acute infarct within the left basal ganglia. 2. Background chronic small vessel ischemic disease and chronic infarcts, as described. 3. 2.2 cm tongue lesion on the right, which could reflect a lipoma or dermoid cyst. Electronically Signed   By: Rockey Childs D.O.   On: 01/02/2024 14:40   CT ANGIO HEAD NECK W WO CM (CODE STROKE) Result Date: 01/02/2024 CLINICAL DATA:  Code stroke. 48 year old female with aphasia. Chronic left MCA vascular stent, chronic left hemisphere infarcts. EXAM: CT ANGIOGRAPHY HEAD AND NECK TECHNIQUE: Multidetector CT imaging of the head and neck was performed using the standard protocol during bolus administration of intravenous contrast. Multiplanar CT image reconstructions and MIPs were obtained to evaluate the vascular anatomy. Carotid stenosis measurements (when applicable) are obtained utilizing NASCET criteria, using the distal internal carotid diameter as the denominator. RADIATION DOSE REDUCTION: This exam was performed according to the departmental dose-optimization program which includes automated exposure control, adjustment of the mA and/or kV according to patient size and/or use of iterative reconstruction technique. CONTRAST:  75mL OMNIPAQUE  IOHEXOL  350 MG/ML SOLN COMPARISON:  Head CT 1208 hours today.  Prior CTA 03/10/2019. FINDINGS: CTA NECK Skeleton: No acute osseous abnormality identified. Some chronic cervical spine disc and endplate degeneration. Upper chest: Negative. Other neck: Nonvascular neck soft tissue spaces appear negative. Aortic arch: Stable 3 vessel arch.  No significant atherosclerosis. Right carotid system: Patent. Mild calcified plaque posterior right ICA origin without stenosis. Left carotid system: Stable and patent with no significant plaque or stenosis. Vertebral arteries: Suboptimal vertebral artery detail due to vessel size and large body habitus. Proximal subclavian arteries and codominant cervical vertebral arteries appear stable and patent with  no significant plaque or stenosis identified. CTA HEAD Posterior circulation: Patent distal vertebral arteries, vertebrobasilar junction, bilateral PICA origins, basilar artery, SCA, PCA origins. Posterior communicating arteries are diminutive or absent. Bilateral PCA branches appear stable with mild irregularity at the P1/P2 junctions. No other stenosis. Anterior circulation: Both ICA siphons are patent. Left siphon calcified plaque is mild without stenosis. Right siphon calcified plaque is mild to moderate without stenosis. Patent carotid termini. Normal MCA and ACA origins. Anterior communicating artery diminutive or absent. Bilateral ACA branches are stable and within normal limits. Right MCA M1 segment is patent. Severe stenosis of the distal right M1 (series 12, image 25 and series 11, image 24) appears stable from the 2020 CTA (series 16, image 49 of that exam). Right MCA bifurcation remains patent. Right MCA branches appear stable since 2020. Contralateral left MCA M1 vascular stent beginning just proximal to the anterior temporal artery is patent, enhancing. Left MCA branches appear stable since 2020. Venous sinuses: Patent. Anatomic variants: None. Review of the  MIP images confirms the above findings Preliminary report of the above communicated to Dr. Vanessa at 1225 hours on 01/02/2024 by text page via the [facility] messaging system. IMPRESSION: 1. Negative for large vessel occlusion. Left MCA vascular stent remains patent, and Left MCA branches appear stable from 2020 CTA. Likewise, Severe chronic stenosis of the distal Right MCA M1 segment and the right MCA branches appear stable. 2. Otherwise mild atherosclerosis most visible at the ICA siphons. No significant extracranial atherosclerosis. Electronically Signed   By: VEAR Hurst M.D.   On: 01/02/2024 12:31   CT HEAD CODE STROKE WO CONTRAST Result Date: 01/02/2024 CLINICAL DATA:  Code stroke.  48 year old female with aphasia. EXAM: CT HEAD WITHOUT CONTRAST  TECHNIQUE: Contiguous axial images were obtained from the base of the skull through the vertex without intravenous contrast. RADIATION DOSE REDUCTION: This exam was performed according to the departmental dose-optimization program which includes automated exposure control, adjustment of the mA and/or kV according to patient size and/or use of iterative reconstruction technique. COMPARISON:  Head CT 03/10/2019. FINDINGS: Brain: Chronic encephalomalacia posterior left MCA/PCA watershed area. Stable ex vacuo enlargement of the left occipital horn. Patchy left frontal lobe, corona radiata hypodensity has progressed since 2020, along with hypodensity tracking into the left lentiform (series 2, images 15-19). No hemorrhagic transformation or mass effect. No overlying cortical changes identified. Stable gray-white differentiation elsewhere. No midline shift, ventriculomegaly, mass effect, evidence of mass lesion, intracranial hemorrhage or evidence of cortically based acute infarction. Vascular: Chronic left MCA vascular stent. Calcified atherosclerosis at the skull base. No suspicious intracranial vascular hyperdensity. Skull: Intact and negative. Sinuses/Orbits: Visualized paranasal sinuses and mastoids are stable and well aerated. Other: No gaze deviation. Visualized orbits and scalp soft tissues are within normal limits. ASPECTS [facility] Stroke Program Early CT Score) Total score (0-10 with 10 being normal): 10 IMPRESSION: 1. Age indeterminate left frontal lobe white matter and left basal ganglia hypodensity, new since 2020. Consider acute or subacute perforator territory ischemia. No associated hemorrhage or mass effect. 2. Underlying chronic left MCA vascular stent, chronic left MCA/PCA watershed infarcts and encephalomalacia. ASPECTS 10. These results were communicated to Dr. Vanessa at 12:22 pm on 01/02/2024 by text page via the [facility] messaging system. Electronically Signed   By: VEAR Hurst M.D.   On: 01/02/2024  12:22    Pertinent labs & imaging results that were available during my care of the patient were reviewed by me and considered in my medical decision making (see MDM for details).  Medications Ordered in ED Medications  aspirin  EC tablet 81 mg (81 mg Oral Given 01/02/24 1309)  ticagrelor  (BRILINTA ) tablet 90 mg (90 mg Oral Given 01/02/24 1310)  hydrALAZINE  (APRESOLINE ) injection 20 mg (20 mg Intravenous Given 01/02/24 1227)  iohexol  (OMNIPAQUE ) 350 MG/ML injection 75 mL (75 mLs Intravenous Contrast Given 01/02/24 1220)  Procedures .Critical Care  Performed by: Mannie Fairy DASEN, DO Authorized by: Mannie Fairy DASEN, DO   Critical care provider statement:    Critical care time (minutes):  35   Critical care was necessary to treat or prevent imminent or life-threatening deterioration of the following conditions:  CNS failure or compromise   Critical care was time spent personally by me on the following activities:  Development of treatment plan with patient or surrogate, discussions with consultants, evaluation of patient's response to treatment, examination of patient, ordering and review of laboratory studies, ordering and review of radiographic studies, ordering and performing treatments and interventions, pulse oximetry, re-evaluation of patient's condition and review of old charts   (including critical care time)  Medical Decision Making / ED Course   This patient presents to the ED for concern of aphasia, this involves an extensive number of treatment options, and is a complaint that carries with it a high risk of complications and morbidity.  The differential diagnosis includes aphasia, hypertension, CVA  MDM: Patient activated as a code stroke.  Upon arrival to the ED, airway cleared.  Taken for CT imaging which showed question of acute stroke.  Patient  evaluated neurology.  They are recommending MRI.  Decision was made by neurology not to provide lytics given low deficits.  Reassessment 3:20 PM-patient's MRI showing acute infarct.  Admitted to medicine for stroke workup.   Additional history obtained: -Additional history obtained from EMS -External records from outside source obtained and reviewed including: Chart review including previous notes, labs, imaging, consultation notes   Lab Tests: -I ordered, reviewed, and interpreted labs.   The pertinent results include:   Labs Reviewed  CBC - Abnormal; Notable for the following components:      Result Value   MCH 25.6 (*)    All other components within normal limits  I-STAT CHEM 8, ED - Abnormal; Notable for the following components:   Potassium 6.0 (*)    Glucose, Bld 219 (*)    Calcium , Ion 1.04 (*)    All other components within normal limits  CBG MONITORING, ED - Abnormal; Notable for the following components:   Glucose-Capillary 228 (*)    All other components within normal limits  ETHANOL  DIFFERENTIAL  RAPID URINE DRUG SCREEN, HOSP PERFORMED  PROTIME-INR  APTT  COMPREHENSIVE METABOLIC PANEL WITH GFR      EKG my independent review of the patient's EKG shows no ST segment depressions or elevations, no T wave inversions, no evidence of acute ischemia.  EKG Interpretation Date/Time:  Friday January 02 2024 12:28:52 EDT Ventricular Rate:  66 PR Interval:  148 QRS Duration:  77 QT Interval:  407 QTC Calculation: 427 R Axis:   49  Text Interpretation: Sinus rhythm Borderline T abnormalities, diffuse leads Confirmed by Mannie Fairy [phone]) on 01/02/2024 1:12:32 PM         Imaging Studies ordered: I ordered imaging studies including head CT, CTA head I independently visualized and interpreted imaging. I agree with the radiologist interpretation   Medicines ordered and prescription drug management: Meds ordered this encounter  Medications   hydrALAZINE   (APRESOLINE ) injection 20 mg   iohexol  (OMNIPAQUE ) 350 MG/ML injection 75 mL   aspirin  EC tablet 81 mg   ticagrelor  (BRILINTA ) tablet 90 mg    -I have reviewed the patients home medicines and have made adjustments as needed  Critical interventions Management of acute stroke   Reevaluation: After the interventions noted above, I reevaluated the patient and found  that they have :improved  Co morbidities that complicate the patient evaluation  Past Medical History:  Diagnosis Date   Allergic rhinitis, cause unspecified    Arthritis    Asthma    Diabetes mellitus type II 08/2009 dx   Endometriosis    Hyperlipemia    Hypertension    Knee pain, right    DJD, post traumatic with repeat falls   Lactose intolerance    Morbid obesity (HCC)    s/p lab band 09/2010   Osteoarthritis    Ovarian cyst    right s/p resection June 2013   Sleep apnea    wears cpap   Stroke [facility]) 2019        Final Clinical Impression(s) / ED Diagnoses Final diagnoses:  Cerebrovascular accident (CVA), unspecified mechanism (HCC)     @PCDICTATION @    Mannie Pac T, DO 01/02/24 1521

## 2024-01-02 NOTE — ED Provider Notes (Signed)
 I took over care of this patient at 3 PM  Physical Exam  BP (!) 199/72   Pulse (!) 59   Temp 98.5 F (36.9 C) (Oral)   Resp 20   LMP 07/28/2011   SpO2 100%   Physical Exam Vitals and nursing note reviewed.  Constitutional:      General: She is not in acute distress.    Appearance: She is well-developed.  HENT:     Head: Normocephalic and atraumatic.  Eyes:     Conjunctiva/sclera: Conjunctivae normal.  Cardiovascular:     Rate and Rhythm: Normal rate and regular rhythm.     Heart sounds: No murmur heard. Pulmonary:     Effort: Pulmonary effort is normal. No respiratory distress.     Breath sounds: Normal breath sounds.  Abdominal:     Palpations: Abdomen is soft.     Tenderness: There is no abdominal tenderness.  Musculoskeletal:        General: No swelling.     Cervical back: Neck supple.  Skin:    General: Skin is warm and dry.     Capillary Refill: Capillary refill takes less than 2 seconds.  Neurological:     Mental Status: She is alert.  Psychiatric:        Mood and Affect: Mood normal.     Procedures  Procedures  ED Course / MDM    Medical Decision Making Patient's case was already discussed with neurology and they would like her admitted to the hospital service.  I discussed patient's case with Dr. Raenelle, hospitalist, and he will admit patient to their service for further workup and management.  Patient is agreeable to plan.  Problems Addressed: Cerebrovascular accident (CVA), unspecified mechanism (HCC): acute illness or injury that poses a threat to life or bodily functions  Amount and/or Complexity of Data Reviewed Labs: ordered. Radiology: ordered.  Risk OTC drugs. Prescription drug management. Decision regarding hospitalization.          Gennaro Duwaine CROME, DO 01/02/24 2210

## 2024-01-02 NOTE — ED Notes (Signed)
 Patient transported to MRI

## 2024-01-02 NOTE — ED Notes (Signed)
Pt ambulated to toilet with minimal assistance.  

## 2024-01-03 ENCOUNTER — Inpatient Hospital Stay (HOSPITAL_COMMUNITY)

## 2024-01-03 ENCOUNTER — Other Ambulatory Visit (HOSPITAL_COMMUNITY): Payer: Self-pay

## 2024-01-03 ENCOUNTER — Encounter (HOSPITAL_COMMUNITY): Payer: Self-pay | Admitting: Internal Medicine

## 2024-01-03 DIAGNOSIS — E785 Hyperlipidemia, unspecified: Secondary | ICD-10-CM | POA: Diagnosis not present

## 2024-01-03 DIAGNOSIS — I10 Essential (primary) hypertension: Secondary | ICD-10-CM | POA: Diagnosis not present

## 2024-01-03 DIAGNOSIS — I161 Hypertensive emergency: Secondary | ICD-10-CM | POA: Diagnosis not present

## 2024-01-03 DIAGNOSIS — Z91148 Patient's other noncompliance with medication regimen for other reason: Secondary | ICD-10-CM

## 2024-01-03 DIAGNOSIS — I63429 Cerebral infarction due to embolism of unspecified anterior cerebral artery: Secondary | ICD-10-CM

## 2024-01-03 DIAGNOSIS — E1151 Type 2 diabetes mellitus with diabetic peripheral angiopathy without gangrene: Secondary | ICD-10-CM | POA: Diagnosis not present

## 2024-01-03 DIAGNOSIS — I6389 Other cerebral infarction: Secondary | ICD-10-CM

## 2024-01-03 DIAGNOSIS — R29701 NIHSS score 1: Secondary | ICD-10-CM | POA: Diagnosis not present

## 2024-01-03 DIAGNOSIS — T45526A Underdosing of antithrombotic drugs, initial encounter: Secondary | ICD-10-CM

## 2024-01-03 LAB — LIPID PANEL
Cholesterol: 180 mg/dL (ref 0–200)
HDL: 51 mg/dL (ref >40)
LDL Cholesterol: 109 mg/dL — ABNORMAL HIGH (ref 0–99)
Total CHOL/HDL Ratio: 3.5 ratio
Triglycerides: 100 mg/dL (ref <150)
VLDL: 20 mg/dL (ref 0–40)

## 2024-01-03 LAB — ECHOCARDIOGRAM COMPLETE BUBBLE STUDY
AR max vel: 1.73 cm2
AV Area VTI: 2.08 cm2
AV Area mean vel: 1.9 cm2
AV Mean grad: 6 mmHg
AV Peak grad: 13.2 mmHg
Ao pk vel: 1.82 m/s
Area-P 1/2: 3.65 cm2
Calc EF: 63.8 %
S' Lateral: 2.6 cm
Single Plane A2C EF: 60.9 %
Single Plane A4C EF: 66 %
Weight: 5657.89 [oz_av]

## 2024-01-03 LAB — CBG MONITORING, ED: Glucose-Capillary: 165 mg/dL — ABNORMAL HIGH (ref 70–99)

## 2024-01-03 LAB — GLUCOSE, CAPILLARY: Glucose-Capillary: 224 mg/dL — ABNORMAL HIGH (ref 70–99)

## 2024-01-03 MED ORDER — BLOOD PRESSURE MONITOR MISC
1.0000 | 0 refills | Status: AC | PRN
  Filled 2024-01-03: qty 1, 30d supply, fill #0

## 2024-01-03 MED ORDER — CLONIDINE HCL 0.1 MG PO TABS
0.1000 mg | ORAL_TABLET | Freq: Every day | ORAL | 11 refills | Status: DC | PRN
  Filled 2024-01-03: qty 60, 60d supply, fill #0

## 2024-01-03 MED ORDER — ROSUVASTATIN CALCIUM 20 MG PO TABS
20.0000 mg | ORAL_TABLET | Freq: Every day | ORAL | Status: DC
  Administered 2024-01-03: 20 mg via ORAL
  Filled 2024-01-03: qty 1

## 2024-01-03 MED ORDER — ROSUVASTATIN CALCIUM 20 MG PO TABS
20.0000 mg | ORAL_TABLET | Freq: Every day | ORAL | 0 refills | Status: AC
  Filled 2024-01-03: qty 30, 30d supply, fill #0

## 2024-01-03 MED ORDER — MELATONIN 5 MG PO TABS: 5.0000 mg | ORAL_TABLET | Freq: Every evening | ORAL | Status: DC | PRN

## 2024-01-03 MED ORDER — LOSARTAN POTASSIUM 25 MG PO TABS
25.0000 mg | ORAL_TABLET | Freq: Every day | ORAL | 3 refills | Status: DC
  Filled 2024-01-03: qty 30, 30d supply, fill #0

## 2024-01-03 MED ORDER — TICAGRELOR 90 MG PO TABS
45.0000 mg | ORAL_TABLET | Freq: Two times a day (BID) | ORAL | 1 refills | Status: AC
  Filled 2024-01-03: qty 30, 30d supply, fill #0

## 2024-01-03 NOTE — ED Notes (Signed)
 Report received, patient moved to ER 5, assumed care of patient at this time.

## 2024-01-03 NOTE — ED Notes (Signed)
Pt Transported to echo.

## 2024-01-03 NOTE — Progress Notes (Signed)
 OT Cancellation Note  Patient Details Name: Debra [name] MRN: [id number] DOB: May 16, 1975   Cancelled Treatment:    Reason Eval/Treat Not Completed: OT screened, no needs identified, will sign off. Per PT, pt is independent. OT to sign off. Please re-consult if change in status.   Debra [name], OTR/L [facility] Acute Rehabilitation Office: [phone]   Debra [name] 01/03/2024, 11:52 AM

## 2024-01-03 NOTE — Progress Notes (Addendum)
 STROKE TEAM PROGRESS NOTE    SIGNIFICANT HOSPITAL EVENTS  9/12: presented as code stroke due to acute onset of speech delay.   Hypertensive on arrival. MRI: Left basal ganglia infarct   INTERIM HISTORY/SUBJECTIVE  On exam, patient continues to have slight speech delay with continued conversation but states that this is much improved from yesterday's symptoms.  She denies headache, shortness of breath, dizziness.  No focal or sensory deficits seen.   OBJECTIVE  CBC    Component Value Date/Time   WBC 9.7 01/02/2024 2112   RBC 5.11 01/02/2024 2112   HGB 13.0 01/02/2024 2112   HGB 13.5 07/01/2018 1412   HCT 42.3 01/02/2024 2112   HCT 42.7 07/01/2018 1412   PLT 268 01/02/2024 2112   MCV 82.8 01/02/2024 2112   MCV 82 07/01/2018 1412   MCH 25.4 (L) 01/02/2024 2112   MCHC 30.7 01/02/2024 2112   RDW 13.1 01/02/2024 2112   RDW 13.7 07/01/2018 1412   LYMPHSABS 1.7 01/02/2024 1200   LYMPHSABS 2.2 07/01/2018 1412   MONOABS 0.4 01/02/2024 1200   EOSABS 0.1 01/02/2024 1200   EOSABS 0.3 07/01/2018 1412   BASOSABS 0.0 01/02/2024 1200   BASOSABS 0.1 07/01/2018 1412    BMET    Component Value Date/Time   NA 139 01/02/2024 2112   NA 142 07/01/2018 1412   K 3.5 01/02/2024 2112   CL 103 01/02/2024 2112   CO2 25 01/02/2024 2112   GLUCOSE 199 (H) 01/02/2024 2112   BUN 10 01/02/2024 2112   BUN 10 07/01/2018 1412   CREATININE 0.86 01/02/2024 2112   CREATININE 0.99 12/15/2019 1056   CALCIUM  8.9 01/02/2024 2112   EGFR 97.0 10/31/2022 1235   GFRNONAA >60 01/02/2024 2112   GFRNONAA 69 12/15/2019 1056    IMAGING past 24 hours MR BRAIN WO CONTRAST Result Date: 01/02/2024 CLINICAL DATA:  Provided history: Stroke, follow-up. EXAM: MRI HEAD WITHOUT CONTRAST TECHNIQUE: Multiplanar, multiecho pulse sequences of the brain and surrounding structures were obtained without intravenous contrast. COMPARISON:  Non-contrast head CT and CT angiogram head/neck performed earlier today 01/02/2024. Brain  MRI 07/03/2017. FINDINGS: Brain: No age-advanced or lobar predominant cerebral atrophy. 2.0 x 1.1 cm acute infarct within the left basal ganglia. Known chronic cortical/subcortical infarcts within the left frontal, temporal, parietal and occipital lobes (MCA territory and MCA/PCA watershed territory). Chronic hemosiderin deposition associated with a chronic infarct at the junction of the left parietal, temporal and occipital lobes. Mild multifocal T2 FLAIR hyperintense signal abnormality elsewhere within the cerebral white matter, nonspecific but compatible chronic small vessel ischemic disease. Small chronic lacunar infarct within the right corona radiata. Punctate chronic microhemorrhages within the left frontoparietal white matter, and within the right cerebellar hemisphere. No evidence of an intracranial mass. No extra-axial fluid collection. No midline shift. Vascular: See CTA head/neck performed earlier today. Artifact arising from a left middle cerebral artery stent. Skull and upper cervical spine: No focal worrisome marrow lesion. Sinuses/Orbits: No mass or acute finding within the imaged orbits. No significant paranasal sinus disease. Other: 2.2 cm ovoid T1 and T2 hyperintense lesion within the tongue on the right (for instance as seen on series 15, image 26) (series 9, image 11). This lesion demonstrated fat density on the CTA head/neck performed earlier today, and this could reflect a lipoma or dermoid cyst. IMPRESSION: 1. 2.0 x 1.1 cm acute infarct within the left basal ganglia. 2. Background chronic small vessel ischemic disease and chronic infarcts, as described. 3. 2.2 cm tongue lesion on the right,  which could reflect a lipoma or dermoid cyst. Electronically Signed   By: Rockey Childs D.O.   On: 01/02/2024 14:40    Vitals:   01/03/24 1100 01/03/24 1115 01/03/24 1130 01/03/24 1200  BP: (!) 177/80 (!) 172/87 (!) 177/80 (!) 164/72  Pulse: 84 64 80 66  Resp:    (!) 28  Temp:      TempSrc:       SpO2: 100% 100% 98% 100%    PHYSICAL EXAM General:  Alert, morbidly obese patient in no acute distress CV: Regular rate and rhythm on monitor Respiratory:  Regular, unlabored respirations on room air  NEURO:  Mental Status: AA&Ox3, patient is able to give clear and coherent history Speech/Language: speech is without dysarthria or aphasia.  Naming, repetition, fluency, and comprehension intact.  Slight intermittent speech delay with continued conversation.  Cranial Nerves:  II: PERRL. Visual fields full.  III, IV, VI: EOMI. Eyelids elevate symmetrically.  V: Sensation is intact to light touch and symmetrical to face.  VII: Face is symmetrical resting and smiling VIII: hearing intact to voice. IX, X: Palate elevates symmetrically. Phonation is normal.  KP:Dynloizm shrug 5/5. XII: tongue is midline without fasciculations. Motor: 5/5 strength to all muscle groups tested.  Tone: is normal and bulk is normal Sensation- Intact to light touch bilaterally. Extinction absent to light touch to DSS.   Coordination: FTN intact bilaterally, HKS: no ataxia in BLE.No drift.  Gait- deferred  Most Recent NIH: 1.    ASSESSMENT/PLAN  Debra [name] is a 48 y.o. female with history of left MCA stroke 2019 with stent placed, on Brilinta  45 mg twice daily, HTN, OSA on CPAP, HLD, uncontrolled DM2 who presented as EMS activated code stroke due to speech delay.  Imaging shows left basal ganglia acute infarct, admitted for full stroke workup.  NIH on Admission: 1.  Stroke:  left BG infarct, etiology, likely small vessel disease due to uncontrolled stroke risk factors and medication non-compliance  Code Stroke CT head Age indeterminate left frontal lobe white matter and left basal ganglia hypodensity, new since 2020, Underlying chronic left MCA vascular stent, chronic left MCA/PCA watershed infarcts and encephalomalacia. ASPECTS 10. CTA head & neck Negative for large vessel occlusion.Left MCA vascular  stent remains patent, and Left MCA branches appear stable from 2020 CTA. Severe chronic stenosis of the distal Right MCA M1 segment and the right MCA branches appear stable. MRI  2.0 x 1.1 cm acute infarct within the left basal ganglia. Background chronic small vessel ischemic disease and chronic infarcts  2D Echo: EF 70 to 75%, mild LVH, trivial MVR LDL 109 HgbA1c 8.5 UDS negative VTE prophylaxis - lovenox  Supposed to be on aspirin  81 and Brilinta  45mg  BID prior to admission.  However patient endorses only taking Brilinta  once a day at home.  Medication compliance education provided.  Continue aspirin  81 daily and Brilinta  45 mg twice daily at discharge. Therapy recommendations:  No follow up needed  Disposition:  likely home today   Hx of Stroke/TIA 06/2017: Patient presented with left MCA territory infarcts in 2019, CT head and neck diffuse intracranial stenosis.  She did have a cerebral angiogram and was found to have left MCA inferior division >75% stenosis and right M1 80% stenosis, no vasculitis but atherosclerosis.  She underwent a stent placement with Dr.Deveshwar and was put on aspirin  and Brilinta  and Crestor  40.  LDL 129, A1c 8.5, UDS negative.  Hypercoag workup negative.  Hypertensive emergency Home meds:  Hyzaar  100-12.5 mg daily (patient states this was recently discontinued by provider?) Stable on high end BP gradually normalize in 2 to 3 days Long-term BP goal normotensive Avoid hypotension  Hyperlipidemia Home meds: None LDL 109, goal < 70 Add Crestor  20 mg Continue statin at discharge  Diabetes type II Uncontrolled Home meds: Glimepiride 4 mg every morning, Mounjaro  HgbA1c 8.5, goal < 7.0 CBGs SSI Recommend close follow-up with PCP for better DM control  Other Stroke Risk Factors Morbid obesity, BMI: 53.77, Encourage weight loss program as outpatient Family hx stroke (father) Obstructive sleep apnea, on CPAP at home  Other medical issues Endometriosis/ovarian  cyst status post hysterectomy and SBO   Hospital day # 1   Pt seen by Neuro NP/APP and later by MD. Note/plan to be edited by MD as needed.    Rocky JAYSON Likes, DNP, AGACNP-BC [facility] Please use AMION for contact information & EPIC for messaging.  ATTENDING NOTE: I reviewed above note and agree with the assessment and plan. Pt was seen and examined.   No family at bedside.  Patient lying bed, stated that her speech much improved.  Currently back to baseline.  Admitted that not very compliant with medication at home.  Specifically, taking Brilinta  45 mg once a day.  Educated on BP monitoring at home.  Continue aspirin  81 and Brilinta  45 mg twice daily, add Crestor  20.  BP still on high end, recommend gradually normalize BP in 2 to 3 days.  Long-term BP goal normotensive.  Aggressive risk factor modification.  PT and OT no recommendation.  [facility] at [facility].  For detailed assessment and plan, please refer to above as I have made changes wherever appropriate.   Neurology will sign off. Please call with questions. Pt will follow up with stroke clinic NP at [facility] in about 4 weeks. Thanks for the consult.   Debra Cummins, MD PhD Stroke Neurology 01/03/2024 3:02 PM     To contact Stroke Continuity provider, please refer to [email or web address]. After hours, contact General Neurology

## 2024-01-03 NOTE — Progress Notes (Signed)
 [facility]   ROUNDING   NOTE EUVA RUNDELL FMW:[id number]  DOB: [birth year]  DOA: 01/02/2024  PCP: Rollene Almarie LABOR, MD  01/03/2024,7:24 AM  LOS: 1 day    Code Status: Full code     from: Home   48 year old black female HTN DM TY 2 Apparent previous stroke L MCA 2019 with stent placement-hypercoagulable workup was negative on Brilinta  and asthma OSA unclear if on CPAP-follows with Dr. Chalice H/o obesity BMI 53 Significant stress caring for her mother History of dizziness near syncope on antihypertensive meds ---Meds adjusted 11/24/2023 office visit and patient was told to take her meds as best able and felt like she could monitor them at home  9/12 LKW 11 AM at work aphasia coworker mentioned this no LVO on CTA Lab work sodium 139 BUN/creatinine 10/0.8 AST/ALT 12/17 bilirubin 0.3 WBC 9.7 hemoglobin 13 platelet 268-drug screen negative MRI brain 2.0 X1.1 acute left basal ganglia infarct background chronic small vessel disease 2.2 cm right tongue lesion?  Lipoma      Assessment  & Plan :    Acute CVA 2.0 X1 0.1 cm in the setting of prior L MCA with stent placement Was taking half dose Brilinta -chart review with note by stroke NP indicates was on 45 twice daily and ASA 81 and was to follow-up with Dr. Jennell for for discontinuation or adjustment of these meds Sounds like will be going to 90 twice daily of Brilinta --risk benefit balance given size of infarct and discussing the same with Dr. OLEGARIO you Await therapy eval's etc. including full workup Permissive hypertension History of orthostasis dizziness with IV antihypertensives in the outpatient Will need to be careful with blood pressure meds-she was not taking them because she felt dizzy on last office visit I would probably discontinue HCTZ component of her losartan  and start losartan  50 and add clonidine  for blood pressure >150 systolic and discussed this with her outpatient PCP who I will CC This will be difficult to manage and I have  explained this to her DM TY 2 Holding at this time Mounjaro  5 weekly Amaryl 4 every morning-only sliding scale at this time-CBGs 160s-200 OHSS, BMI 53-follows with Dr. Chalice for sleep apnea Right tongue lesion Etiology unclear-needs outpatient follow-up probably with dental/ENT once all is resolved Hyperkalemia on admission Resolved/spurious no further workup Stop Lokelma  and repeat labs a.m.   Data Reviewed:   LDL 109 HDL 51  DVT prophylaxis: Lovenox   Status is: Inpatient Remains inpatient appropriate because: Requires inpatient workup and discussion     Current Dispo: Likely home 24 to 48 hours    Subjective:   All speech deficits resolved-ambulating to bathroom No pain no fever Does not like breakfast but has been able to eat Daughter at bedside Feels close to normal We had a long discussion about maybe going back to a higher dose of Brilinta  and the risks of the same so we need to watch her probably at least a day    Objective + exam Vitals:   01/03/24 0400 01/03/24 0500 01/03/24 0600 01/03/24 0700  BP: (!) 166/73 (!) 174/85 (!) 181/99 (!) 151/69  Pulse: (!) 53 (!) 53 (!) 55 (!) 53  Resp: (!) 26 (!) 21 (!) 23   Temp: 98.6 F (37 C)     TempSrc: Oral     SpO2: 100% 100% 100% 100%   There were no vitals filed for this visit.   Examination: EOMI NCAT no focal deficit no icterus no pallor Thick neck Mallampati  4 Chest clear no added sound S1-S2 no murmur Finger-nose-finger intact Power 5/5 Smile symmetric Vision by direct confrontation intact no visual cut Shoulder shrug bilaterally equal Jaw clenched equal, frontalis intact Reflexes attenuated lower extremities Babinski upgoing Reflexes attenuated brachioradialis and biceps   Scheduled Meds:   stroke: early stages of recovery book   Does not apply Once   aspirin  EC  81 mg Oral Daily   enoxaparin  (LOVENOX ) injection  80 mg Subcutaneous Q24H   insulin  aspart  0-15 Units Subcutaneous TID WC    sodium zirconium cyclosilicate   10 g Oral BID   ticagrelor   90 mg Oral BID   Continuous Infusions:  Time 60  Jai-Gurmukh Arleny Kruger, MD  [facility]

## 2024-01-03 NOTE — Discharge Summary (Addendum)
 Physician Discharge Summary  Debra [name] FMW:[id number] DOB: 06/22/1975 DOA: 01/02/2024  PCP: Rollene Almarie LABOR, MD  Admit date: 01/02/2024 Discharge date: 01/03/2024  Time spent: 35 minutes  Recommendations for Outpatient Follow-up:  Increased Brilinta  to 45 twice daily-was taking only once daily at home-continue aspirin  and needs close follow-up with [facility] physician Dr. Rosemarie who has seen patient in the past-decision was made to use high-dose Brilinta  and follow-up closely secondary to 2 cm CVA and risk of hemorrhage Blood pressure medications adjusted as below-clonidine  added Needs labs in a week Does need follow-up of tongue/dental lesion with either ENT or dental at  Discharge Diagnoses:  MAIN problem for hospitalization   Left basal ganglia infarct 2 x 1.1 cm left basal ganglia  Please see below for itemized issues addressed in [facility]- refer to other progress notes for clarity if needed  Discharge Condition: Improved  Diet recommendation: Diabetic heart healthy  There were no vitals filed for this visit.  History of present illness:  48 year old black female HTN DM TY 2 Apparent previous stroke L MCA 2019 with stent placement-hypercoagulable workup was negative on Brilinta  and asthma OSA unclear if on CPAP-follows with Dr. Chalice H/o obesity BMI 53 Significant stress caring for her mother History of dizziness near syncope on antihypertensive meds ---Meds adjusted 11/24/2023 office visit and patient was told to take her meds as best able and felt like she could monitor them at home   9/12 LKW 11 AM at work aphasia coworker mentioned this no LVO on CTA Lab work sodium 139 BUN/creatinine 10/0.8 AST/ALT 12/17 bilirubin 0.3 WBC 9.7 hemoglobin 13 platelet 268-drug screen negative MRI brain 2.0 X1.1 acute left basal ganglia infarct background chronic small vessel disease 2.2 cm right tongue lesion?  Lipoma          Assessment  & Plan :      Acute CVA 2.0 X1 0.1 cm  in the setting of prior L MCA with stent placement Taking Brilinta  incorrectly- Therapy cleared the patient for home given complete resolution of symptoms/dysphagia dysarthria Decision per neurology-Brilinta  45 twice daily, ASA 81, Crestor  added Permissive hypertension History of orthostasis dizziness with IV antihypertensives in the outpatient Will need to be careful with blood pressure meds-she was not taking them because she felt dizzy on last office visit Discharging on lower dose of blood pressure meds-only losartan  at lower dose Added as needed clonidine  for blood pressure above 160 DM TY 2 Resumed Amaryl 4 mg Mounjaro  and can follow-up with outpatient setting OHSS, BMI 53-follows with Dr. Chalice for sleep apnea Right tongue lesion Etiology unclear-needs outpatient follow-up probably with dental/ENT once all is resolved Hyperkalemia on admission Resolved/spurious no further workup Stop Lokelma  and repeat labs a.m.  Discharge Exam: Vitals:   01/03/24 1200 01/03/24 1300  BP: (!) 164/72 (!) 182/86  Pulse: 66   Resp: (!) 28   Temp:  99.2 F (37.3 C)  SpO2: 100%    See prior exam from this morning   Discharge Instructions   Discharge Instructions     Ambulatory referral to Neurology   Complete by: As directed    An appointment is requested in approximately: 8 weeks   Diet - low sodium heart healthy   Complete by: As directed    Discharge instructions   Complete by: As directed    Make sure that you look at your medications you know carefully and remember that you need to be taking the Brilinta  twice a day, continue Crestor  20 that we  started because of your elevated cholesterol We have stopped your Hyzaar and started you on losartan  at 50 mg and you should follow-up with primary care Dr. Rollene Added a medication called clonidine  for blood pressures above 160 systolic-please make sure that you have a blood pressure cuff at home Resume all of the other meds without  change   Increase activity slowly   Complete by: As directed       Allergies as of 01/03/2024       Reactions   Peanut-containing Drug Products Anaphylaxis   Strawberry (diagnostic) Anaphylaxis   Morphine  Hives   Augmentin  [amoxicillin -pot Clavulanate] Hives, Rash   To bilat post hands only   Influenza Vaccines Itching, Rash   Metformin  And Related Diarrhea        Medication List     STOP taking these medications    losartan -hydrochlorothiazide  100-12.5 MG tablet Commonly known as: HYZAAR       TAKE these medications    albuterol  (2.5 MG/3ML) 0.083% nebulizer solution Commonly known as: PROVENTIL  Take 3 mLs (2.5 mg total) by nebulization every 6 (six) hours as needed for wheezing or shortness of breath.   albuterol  108 (90 Base) MCG/ACT inhaler Commonly known as: ProAir  HFA Inhale 2 puffs into the lungs every 4 (four) hours as needed for wheezing or shortness of breath.   aspirin  EC 81 MG tablet Take 81 mg by mouth daily. Swallow whole.   Blood Pressure Kit 1 each by Does not apply route as needed.   cloNIDine  0.1 MG tablet Commonly known as: CATAPRES  Take 1 tablet (0.1 mg total) by mouth daily as needed (for SBP > 160).   EPINEPHrine  0.3 mg/0.3 mL Soaj injection Commonly known as: EPI-PEN Inject 0.3 mLs (0.3 mg total) into the muscle as needed for anaphylaxis.   fexofenadine 180 MG tablet Commonly known as: ALLEGRA Take 180 mg by mouth daily.   fluticasone  50 MCG/ACT nasal spray Commonly known as: FLONASE  Place 2 sprays into both nostrils daily as needed for allergies.   glimepiride 2 MG tablet Commonly known as: AMARYL Take 4 mg by mouth daily with breakfast.   losartan  25 MG tablet Commonly known as: Cozaar  Take 1 tablet (25 mg total) by mouth daily.   melatonin 5 MG Tabs Take 1 tablet (5 mg total) by mouth at bedtime as needed.   montelukast  10 MG tablet Commonly known as: SINGULAIR  Take 1 tablet (10 mg total) by mouth at bedtime.    rosuvastatin  20 MG tablet Commonly known as: CRESTOR  Take 1 tablet (20 mg total) by mouth daily. Start taking on: January 04, 2024   senna 8.6 MG Tabs tablet Commonly known as: SENOKOT Take 1 tablet by mouth daily.   ticagrelor  90 MG Tabs tablet Commonly known as: BRILINTA  Take 0.5 tablets (45 mg total) by mouth 2 (two) times daily. What changed: See the new instructions.   tirzepatide  5 MG/0.5ML Pen Commonly known as: MOUNJARO  Inject 5 mg into the skin once a week.       Allergies  Allergen Reactions   Peanut-Containing Drug Products Anaphylaxis   Strawberry (Diagnostic) Anaphylaxis   Morphine  Hives   Augmentin  [Amoxicillin -Pot Clavulanate] Hives and Rash    To bilat post hands only   Influenza Vaccines Itching and Rash   Metformin  And Related Diarrhea      The results of significant diagnostics from this hospitalization (including imaging, microbiology, ancillary and laboratory) are listed below for reference.    Significant Diagnostic Studies: ECHOCARDIOGRAM COMPLETE BUBBLE STUDY  Result Date: 01/03/2024    ECHOCARDIOGRAM REPORT   Patient Name:   Debra [name] Date of Exam: 01/03/2024 Medical Rec #:  [id number]       Height:       68.0 in Accession #:    [id number]      Weight:       353.6 lb Date of Birth:  1975/09/26        BSA:          2.604 m Patient Age:    48 years        BP:           151/69 mmHg Patient Gender: F               HR:           66 bpm. Exam Location:  Inpatient Procedure: 2D Echo, Cardiac Doppler, Color Doppler, Strain Analysis and Saline            Contrast Bubble Study (Both Spectral and Color Flow Doppler were            utilized during procedure). Indications:    Stroke  History:        Patient has prior history of Echocardiogram examinations, most                 recent 09/02/2017. Stroke; Risk Factors:Hypertension, Diabetes,                 Dyslipidemia and Sleep Apnea.  Sonographer:    Logan Shove RDCS Referring Phys: [id number] DONALDA M GHIMIRE  IMPRESSIONS  1. Left ventricular ejection fraction, by estimation, is 70 to 75%. The left ventricle has hyperdynamic function. The left ventricle has no regional wall motion abnormalities. There is mild left ventricular hypertrophy. Left ventricular diastolic parameters were normal. The average left ventricular global longitudinal strain is -21.0 %. The global longitudinal strain is normal.  2. Right ventricular systolic function is normal. The right ventricular size is normal. Tricuspid regurgitation signal is inadequate for assessing PA pressure.  3. The mitral valve is grossly normal. Trivial mitral valve regurgitation. No evidence of mitral stenosis.  4. The aortic valve is tricuspid. Aortic valve regurgitation is not visualized. No aortic stenosis is present.  5. The inferior vena cava is normal in size with greater than 50% respiratory variability, suggesting right atrial pressure of 3 mmHg.  6. Nondiagnostic bubble study due to image quality. Suspect no large interatrial shunting. Conclusion(s)/Recommendation(s): No intracardiac source of embolism detected on this transthoracic study. Consider a transesophageal echocardiogram to exclude cardiac source of embolism if clinically indicated. FINDINGS  Left Ventricle: Left ventricular ejection fraction, by estimation, is 70 to 75%. The left ventricle has hyperdynamic function. The left ventricle has no regional wall motion abnormalities. The average left ventricular global longitudinal strain is -21.0  %. Strain was performed and the global longitudinal strain is normal. The left ventricular internal cavity size was normal in size. There is mild left ventricular hypertrophy. Left ventricular diastolic parameters were normal. Right Ventricle: The right ventricular size is normal. No increase in right ventricular wall thickness. Right ventricular systolic function is normal. Tricuspid regurgitation signal is inadequate for assessing PA pressure. Left Atrium: Left  atrial size was normal in size. Right Atrium: Right atrial size was normal in size. Pericardium: There is no evidence of pericardial effusion. Mitral Valve: The mitral valve is grossly normal. Trivial mitral valve regurgitation. No evidence of mitral valve stenosis. Tricuspid Valve: The tricuspid valve is normal  in structure. Tricuspid valve regurgitation is trivial. No evidence of tricuspid stenosis. Aortic Valve: The aortic valve is tricuspid. Aortic valve regurgitation is not visualized. No aortic stenosis is present. Aortic valve mean gradient measures 6.0 mmHg. Aortic valve peak gradient measures 13.2 mmHg. Aortic valve area, by VTI measures 2.08  cm. Pulmonic Valve: The pulmonic valve was normal in structure. Pulmonic valve regurgitation is trivial. No evidence of pulmonic stenosis. Aorta: The aortic root is normal in size and structure. Venous: The inferior vena cava is normal in size with greater than 50% respiratory variability, suggesting right atrial pressure of 3 mmHg. IAS/Shunts: The atrial septum is grossly normal. Agitated saline contrast was given intravenously to evaluate for intracardiac shunting. Nondiagnostic bubble study due to image quality. Suspect no large interatrial shunting.  LEFT VENTRICLE PLAX 2D LVIDd:         5.10 cm      Diastology LVIDs:         2.60 cm      LV e' medial:    7.94 cm/s LV PW:         1.00 cm      LV E/e' medial:  13.4 LV IVS:        1.00 cm      LV e' lateral:   10.20 cm/s LVOT diam:     1.90 cm      LV E/e' lateral: 10.4 LV SV:         72 LV SV Index:   28           2D Longitudinal Strain LVOT Area:     2.84 cm     2D Strain GLS Avg:     -21.0 %  LV Volumes (MOD) LV vol d, MOD A2C: 135.0 ml LV vol d, MOD A4C: 126.0 ml LV vol s, MOD A2C: 52.8 ml LV vol s, MOD A4C: 42.8 ml LV SV MOD A2C:     82.2 ml LV SV MOD A4C:     126.0 ml LV SV MOD BP:      85.7 ml RIGHT VENTRICLE            IVC RV Basal diam:  3.40 cm    IVC diam: 1.10 cm RV S prime:     9.03 cm/s TAPSE  (M-mode): 1.8 cm LEFT ATRIUM             Index        RIGHT ATRIUM           Index LA diam:        4.20 cm 1.61 cm/m   RA Area:     17.50 cm LA Vol (A2C):   67.4 ml 25.88 ml/m  RA Volume:   52.30 ml  20.09 ml/m LA Vol (A4C):   52.6 ml 20.20 ml/m LA Biplane Vol: 60.5 ml 23.23 ml/m  AORTIC VALVE AV Area (Vmax):    1.73 cm AV Area (Vmean):   1.90 cm AV Area (VTI):     2.08 cm AV Vmax:           182.00 cm/s AV Vmean:          111.000 cm/s AV VTI:            0.345 m AV Peak Grad:      13.2 mmHg AV Mean Grad:      6.0 mmHg LVOT Vmax:         111.00 cm/s LVOT Vmean:  74.500 cm/s LVOT VTI:          0.253 m LVOT/AV VTI ratio: 0.73  AORTA Ao Root diam: 2.70 cm Ao Asc diam:  3.60 cm MITRAL VALVE MV Area (PHT): 3.65 cm     SHUNTS MV Decel Time: 208 msec     Systemic VTI:  0.25 m MV E velocity: 106.00 cm/s  Systemic Diam: 1.90 cm MV A velocity: 100.00 cm/s MV E/A ratio:  1.06 Soyla Merck MD Electronically signed by Soyla Merck MD Signature Date/Time: 01/03/2024/12:58:55 PM    Final    MR BRAIN WO CONTRAST Result Date: 01/02/2024 CLINICAL DATA:  Provided history: Stroke, follow-up. EXAM: MRI HEAD WITHOUT CONTRAST TECHNIQUE: Multiplanar, multiecho pulse sequences of the brain and surrounding structures were obtained without intravenous contrast. COMPARISON:  Non-contrast head CT and CT angiogram head/neck performed earlier today 01/02/2024. Brain MRI 07/03/2017. FINDINGS: Brain: No age-advanced or lobar predominant cerebral atrophy. 2.0 x 1.1 cm acute infarct within the left basal ganglia. Known chronic cortical/subcortical infarcts within the left frontal, temporal, parietal and occipital lobes (MCA territory and MCA/PCA watershed territory). Chronic hemosiderin deposition associated with a chronic infarct at the junction of the left parietal, temporal and occipital lobes. Mild multifocal T2 FLAIR hyperintense signal abnormality elsewhere within the cerebral white matter, nonspecific but compatible  chronic small vessel ischemic disease. Small chronic lacunar infarct within the right corona radiata. Punctate chronic microhemorrhages within the left frontoparietal white matter, and within the right cerebellar hemisphere. No evidence of an intracranial mass. No extra-axial fluid collection. No midline shift. Vascular: See CTA head/neck performed earlier today. Artifact arising from a left middle cerebral artery stent. Skull and upper cervical spine: No focal worrisome marrow lesion. Sinuses/Orbits: No mass or acute finding within the imaged orbits. No significant paranasal sinus disease. Other: 2.2 cm ovoid T1 and T2 hyperintense lesion within the tongue on the right (for instance as seen on series 15, image 26) (series 9, image 11). This lesion demonstrated fat density on the CTA head/neck performed earlier today, and this could reflect a lipoma or dermoid cyst. IMPRESSION: 1. 2.0 x 1.1 cm acute infarct within the left basal ganglia. 2. Background chronic small vessel ischemic disease and chronic infarcts, as described. 3. 2.2 cm tongue lesion on the right, which could reflect a lipoma or dermoid cyst. Electronically Signed   By: Rockey Childs D.O.   On: 01/02/2024 14:40   CT ANGIO HEAD NECK W WO CM (CODE STROKE) Result Date: 01/02/2024 CLINICAL DATA:  Code stroke. 48 year old female with aphasia. Chronic left MCA vascular stent, chronic left hemisphere infarcts. EXAM: CT ANGIOGRAPHY HEAD AND NECK TECHNIQUE: Multidetector CT imaging of the head and neck was performed using the standard protocol during bolus administration of intravenous contrast. Multiplanar CT image reconstructions and MIPs were obtained to evaluate the vascular anatomy. Carotid stenosis measurements (when applicable) are obtained utilizing NASCET criteria, using the distal internal carotid diameter as the denominator. RADIATION DOSE REDUCTION: This exam was performed according to the departmental dose-optimization program which includes  automated exposure control, adjustment of the mA and/or kV according to patient size and/or use of iterative reconstruction technique. CONTRAST:  75mL OMNIPAQUE  IOHEXOL  350 MG/ML SOLN COMPARISON:  Head CT 1208 hours today.  Prior CTA 03/10/2019. FINDINGS: CTA NECK Skeleton: No acute osseous abnormality identified. Some chronic cervical spine disc and endplate degeneration. Upper chest: Negative. Other neck: Nonvascular neck soft tissue spaces appear negative. Aortic arch: Stable 3 vessel arch.  No significant atherosclerosis. Right carotid system: Patent. Mild  calcified plaque posterior right ICA origin without stenosis. Left carotid system: Stable and patent with no significant plaque or stenosis. Vertebral arteries: Suboptimal vertebral artery detail due to vessel size and large body habitus. Proximal subclavian arteries and codominant cervical vertebral arteries appear stable and patent with no significant plaque or stenosis identified. CTA HEAD Posterior circulation: Patent distal vertebral arteries, vertebrobasilar junction, bilateral PICA origins, basilar artery, SCA, PCA origins. Posterior communicating arteries are diminutive or absent. Bilateral PCA branches appear stable with mild irregularity at the P1/P2 junctions. No other stenosis. Anterior circulation: Both ICA siphons are patent. Left siphon calcified plaque is mild without stenosis. Right siphon calcified plaque is mild to moderate without stenosis. Patent carotid termini. Normal MCA and ACA origins. Anterior communicating artery diminutive or absent. Bilateral ACA branches are stable and within normal limits. Right MCA M1 segment is patent. Severe stenosis of the distal right M1 (series 12, image 25 and series 11, image 24) appears stable from the 2020 CTA (series 16, image 49 of that exam). Right MCA bifurcation remains patent. Right MCA branches appear stable since 2020. Contralateral left MCA M1 vascular stent beginning just proximal to the  anterior temporal artery is patent, enhancing. Left MCA branches appear stable since 2020. Venous sinuses: Patent. Anatomic variants: None. Review of the MIP images confirms the above findings Preliminary report of the above communicated to Dr. Vanessa at 1225 hours on 01/02/2024 by text page via the [facility] messaging system. IMPRESSION: 1. Negative for large vessel occlusion. Left MCA vascular stent remains patent, and Left MCA branches appear stable from 2020 CTA. Likewise, Severe chronic stenosis of the distal Right MCA M1 segment and the right MCA branches appear stable. 2. Otherwise mild atherosclerosis most visible at the ICA siphons. No significant extracranial atherosclerosis. Electronically Signed   By: VEAR Hurst M.D.   On: 01/02/2024 12:31   CT HEAD CODE STROKE WO CONTRAST Result Date: 01/02/2024 CLINICAL DATA:  Code stroke.  48 year old female with aphasia. EXAM: CT HEAD WITHOUT CONTRAST TECHNIQUE: Contiguous axial images were obtained from the base of the skull through the vertex without intravenous contrast. RADIATION DOSE REDUCTION: This exam was performed according to the departmental dose-optimization program which includes automated exposure control, adjustment of the mA and/or kV according to patient size and/or use of iterative reconstruction technique. COMPARISON:  Head CT 03/10/2019. FINDINGS: Brain: Chronic encephalomalacia posterior left MCA/PCA watershed area. Stable ex vacuo enlargement of the left occipital horn. Patchy left frontal lobe, corona radiata hypodensity has progressed since 2020, along with hypodensity tracking into the left lentiform (series 2, images 15-19). No hemorrhagic transformation or mass effect. No overlying cortical changes identified. Stable gray-white differentiation elsewhere. No midline shift, ventriculomegaly, mass effect, evidence of mass lesion, intracranial hemorrhage or evidence of cortically based acute infarction. Vascular: Chronic left MCA vascular stent.  Calcified atherosclerosis at the skull base. No suspicious intracranial vascular hyperdensity. Skull: Intact and negative. Sinuses/Orbits: Visualized paranasal sinuses and mastoids are stable and well aerated. Other: No gaze deviation. Visualized orbits and scalp soft tissues are within normal limits. ASPECTS [facility] Stroke Program Early CT Score) Total score (0-10 with 10 being normal): 10 IMPRESSION: 1. Age indeterminate left frontal lobe white matter and left basal ganglia hypodensity, new since 2020. Consider acute or subacute perforator territory ischemia. No associated hemorrhage or mass effect. 2. Underlying chronic left MCA vascular stent, chronic left MCA/PCA watershed infarcts and encephalomalacia. ASPECTS 10. These results were communicated to Dr. Vanessa at 12:22 pm on 01/02/2024 by text page via  the [employer] system. Electronically Signed   By: VEAR Hurst M.D.   On: 01/02/2024 12:22    Microbiology: No results found for this or any previous visit (from the past 240 hours).   Labs: Basic Metabolic Panel: Recent Labs  Lab 01/02/24 1204 01/02/24 2112  NA 138 139  K 6.0* 3.5  CL 105 103  CO2  --  25  GLUCOSE 219* 199*  BUN 20 10  CREATININE 0.90 0.86  CALCIUM   --  8.9   Liver Function Tests: Recent Labs  Lab 01/02/24 2112  AST 12*  ALT 17  ALKPHOS 71  BILITOT 0.3  PROT 6.3*  ALBUMIN  3.2*   No results for input(s): LIPASE, AMYLASE in the last 168 hours. No results for input(s): AMMONIA in the last 168 hours. CBC: Recent Labs  Lab 01/02/24 1200 01/02/24 1204 01/02/24 2112  WBC 7.7  --  9.7  NEUTROABS 5.3  --   --   HGB 13.0 14.3 13.0  HCT 42.5 42.0 42.3  MCV 83.7  --  82.8  PLT 218  --  268   Cardiac Enzymes: No results for input(s): CKTOTAL, CKMB, CKMBINDEX, TROPONINI in the last 168 hours. BNP: BNP (last 3 results) No results for input(s): BNP in the last 8760 hours.  ProBNP (last 3 results) No results for input(s): PROBNP in the  last 8760 hours.  CBG: Recent Labs  Lab 01/02/24 1200 01/02/24 1650 01/02/24 2208 01/03/24 0812 01/03/24 1320  GLUCAP 228* 179* 185* 165* 224*    Signed:  Colen Grimes MD   [facility] 01/03/2024, 1:56 PM

## 2024-01-03 NOTE — Evaluation (Signed)
 Physical Therapy Evaluation and Discharge  Patient Details Name: Debra [name] MRN: [id number] DOB: 10/24/1975 Today's Date: 01/03/2024  History of Present Illness  48 year old black female at work aphasia. Presented to ED 01/02/24.  no LVO on CTA; MRI brain 2.0 X1.1 acute left basal ganglia infarct PMH- HTN DM2 L MCA [id number]  Clinical Impression  Patient evaluated by Physical Therapy with no further acute PT needs identified. All education has been completed and the patient has no further questions. Patient scored 22/24 on Dynamic Gait Index and reports feeling very close to her baseline (has difficulty describing what is different).  PT is signing off. Thank you for this referral.         If plan is discharge home, recommend the following:     Can travel by private vehicle        Equipment Recommendations None recommended by PT  Recommendations for Other Services       Functional Status Assessment Patient has not had a recent decline in their functional status     Precautions / Restrictions Precautions Precautions: None      Mobility  Bed Mobility Overal bed mobility: Modified Independent             General bed mobility comments: ED stretcher; HOB elevated    Transfers Overall transfer level: Independent Equipment used: None                    Ambulation/Gait Ambulation/Gait assistance: Independent Gait Distance (Feet): 200 Feet Assistive device: None Gait Pattern/deviations: Step-through pattern, Decreased stride length, Wide base of support   Gait velocity interpretation: 1.31 - 2.62 ft/sec, indicative of limited community ambulator   General Gait Details: slight incr trunk lateral sway which pt reports is baseline (likely due to body habitus); reports nearly at baseline but can't describe what is different;  Stairs Stairs:  (no in ED)          Wheelchair Mobility     Tilt Bed    Modified Rankin (Stroke Patients Only) Modified  Rankin (Stroke Patients Only) Pre-Morbid Rankin Score: No symptoms Modified Rankin: No significant disability     Balance Overall balance assessment: Independent                               Standardized Balance Assessment Standardized Balance Assessment : Dynamic Gait Index   Dynamic Gait Index Level Surface: Normal Change in Gait Speed: Normal Gait with Horizontal Head Turns: Normal Gait with Vertical Head Turns: Normal Gait and Pivot Turn: Normal Step Over Obstacle: Mild Impairment Step Around Obstacles: Normal Steps: Mild Impairment (clinical judgment; no steps in ED) Total Score: 22       Pertinent Vitals/Pain Pain Assessment Pain Assessment: No/denies pain    Home Living Family/patient expects to be discharged to:: Private residence Living Arrangements: Parent (caregiver for mother) Available Help at Discharge: Family Type of Home: House Home Access: Stairs to enter Entrance Stairs-Rails: Right;Left;Can reach both Secretary/administrator of Steps: 3   Home Layout: One level Home Equipment: Grab bars - tub/shower      Prior Function Prior Level of Function : Independent/Modified Independent             Mobility Comments: care for mother; assists to transfer and walk (min-mod assist depending on the day)       Extremity/Trunk Assessment   Upper Extremity Assessment Upper Extremity Assessment: Overall WFL for tasks assessed  Lower Extremity Assessment Lower Extremity Assessment: Overall WFL for tasks assessed    Cervical / Trunk Assessment Cervical / Trunk Assessment: Other exceptions Cervical / Trunk Exceptions: overweight  Communication   Communication Communication: No apparent difficulties    Cognition Arousal: Alert Behavior During Therapy: WFL for tasks assessed/performed   PT - Cognitive impairments: No apparent impairments                         Following commands: Intact       Cueing Cueing  Techniques: Verbal cues     General Comments General comments (skin integrity, edema, etc.): BP stable but elevated pre- and post-ambulation (170s/80s)    Exercises     Assessment/Plan    PT Assessment Patient does not need any further PT services  PT Problem List         PT Treatment Interventions      PT Goals (Current goals can be found in the Care Plan section)  Acute Rehab PT Goals Patient Stated Goal: go home today PT Goal Formulation: All assessment and education complete, DC therapy    Frequency       Co-evaluation               AM-PAC PT 6 Clicks Mobility  Outcome Measure Help needed turning from your back to your side while in a flat bed without using bedrails?: None Help needed moving from lying on your back to sitting on the side of a flat bed without using bedrails?: None Help needed moving to and from a bed to a chair (including a wheelchair)?: None Help needed standing up from a chair using your arms (e.g., wheelchair or bedside chair)?: None Help needed to walk in hospital room?: None Help needed climbing 3-5 steps with a railing? : None 6 Click Score: 24    End of Session   Activity Tolerance: Patient tolerated treatment well Patient left: in bed;with call bell/phone within reach;with family/visitor present Nurse Communication: Mobility status;Other (comment) (no PT/OT or DME needs) PT Visit Diagnosis: Other abnormalities of gait and mobility (R26.89)    Time: 8880-8862 PT Time Calculation (min) (ACUTE ONLY): 18 min   Charges:   PT Evaluation $PT Eval Low Complexity: 1 Low   PT General Charges $$ ACUTE PT VISIT: 1 Visit          Macario RAMAN, PT Acute Rehabilitation Services  Office [phone]   Macario SHAUNNA Soja 01/03/2024, 11:48 AM

## 2024-01-03 NOTE — Progress Notes (Signed)
 Echocardiogram 2D Echocardiogram has been performed.  Kagan Hietpas N Sherrell Farish,RDCS 01/03/2024, 10:59 AM

## 2024-01-03 NOTE — Plan of Care (Signed)
 Problem: Education: Goal: Knowledge of disease or condition will improve Outcome: Adequate for Discharge Goal: Knowledge of secondary prevention will improve (MUST DOCUMENT ALL) Outcome: Adequate for Discharge Goal: Knowledge of patient specific risk factors will improve (DELETE if not current risk factor) Outcome: Adequate for Discharge   Problem: Ischemic Stroke/TIA Tissue Perfusion: Goal: Complications of ischemic stroke/TIA will be minimized Outcome: Adequate for Discharge   Problem: Coping: Goal: Will verbalize positive feelings about self Outcome: Adequate for Discharge Goal: Will identify appropriate support needs Outcome: Adequate for Discharge   Problem: Health Behavior/Discharge Planning: Goal: Ability to manage health-related needs will improve Outcome: Adequate for Discharge Goal: Goals will be collaboratively established with patient/family Outcome: Adequate for Discharge   Problem: Self-Care: Goal: Ability to participate in self-care as condition permits will improve Outcome: Adequate for Discharge Goal: Verbalization of feelings and concerns over difficulty with self-care will improve Outcome: Adequate for Discharge Goal: Ability to communicate needs accurately will improve Outcome: Adequate for Discharge   Problem: Nutrition: Goal: Risk of aspiration will decrease Outcome: Adequate for Discharge Goal: Dietary intake will improve Outcome: Adequate for Discharge   Problem: Education: Goal: Ability to describe self-care measures that may prevent or decrease complications (Diabetes Survival Skills Education) will improve Outcome: Adequate for Discharge Goal: Individualized Educational Video(s) Outcome: Adequate for Discharge   Problem: Coping: Goal: Ability to adjust to condition or change in health will improve Outcome: Adequate for Discharge   Problem: Fluid Volume: Goal: Ability to maintain a balanced intake and output will improve Outcome: Adequate  for Discharge   Problem: Health Behavior/Discharge Planning: Goal: Ability to identify and utilize available resources and services will improve Outcome: Adequate for Discharge Goal: Ability to manage health-related needs will improve Outcome: Adequate for Discharge   Problem: Metabolic: Goal: Ability to maintain appropriate glucose levels will improve Outcome: Adequate for Discharge   Problem: Nutritional: Goal: Maintenance of adequate nutrition will improve Outcome: Adequate for Discharge Goal: Progress toward achieving an optimal weight will improve Outcome: Adequate for Discharge   Problem: Skin Integrity: Goal: Risk for impaired skin integrity will decrease Outcome: Adequate for Discharge   Problem: Tissue Perfusion: Goal: Adequacy of tissue perfusion will improve Outcome: Adequate for Discharge   Problem: Education: Goal: Knowledge of General Education information will improve Description: Including pain rating scale, medication(s)/side effects and non-pharmacologic comfort measures Outcome: Adequate for Discharge   Problem: Health Behavior/Discharge Planning: Goal: Ability to manage health-related needs will improve Outcome: Adequate for Discharge   Problem: Clinical Measurements: Goal: Ability to maintain clinical measurements within normal limits will improve Outcome: Adequate for Discharge Goal: Will remain free from infection Outcome: Adequate for Discharge Goal: Diagnostic test results will improve Outcome: Adequate for Discharge Goal: Respiratory complications will improve Outcome: Adequate for Discharge Goal: Cardiovascular complication will be avoided Outcome: Adequate for Discharge   Problem: Activity: Goal: Risk for activity intolerance will decrease Outcome: Adequate for Discharge   Problem: Nutrition: Goal: Adequate nutrition will be maintained Outcome: Adequate for Discharge   Problem: Coping: Goal: Level of anxiety will decrease Outcome:  Adequate for Discharge   Problem: Elimination: Goal: Will not experience complications related to bowel motility Outcome: Adequate for Discharge Goal: Will not experience complications related to urinary retention Outcome: Adequate for Discharge   Problem: Pain Managment: Goal: General experience of comfort will improve and/or be controlled Outcome: Adequate for Discharge   Problem: Safety: Goal: Ability to remain free from injury will improve Outcome: Adequate for Discharge   Problem: Skin Integrity: Goal: Risk for  impaired skin integrity will decrease Outcome: Adequate for Discharge

## 2024-01-09 ENCOUNTER — Telehealth: Payer: Self-pay | Admitting: Radiology

## 2024-01-09 NOTE — Telephone Encounter (Signed)
 Copied from CRM [id number]. Topic: General - Other >> Jan 08, 2024  4:24 PM Harlene ORN wrote: Reason for CRM: Patient called requesting a doctor's note for her previous appointment on 01/02/2024 Please send the letter via MyChart

## 2024-01-09 NOTE — Telephone Encounter (Signed)
 She was seen at ED

## 2024-01-09 NOTE — Telephone Encounter (Signed)
 Called patient and asked her to reach back out to the hospital first and check with them to see if they are able to do a note and if not then she can reach back out to us  and we will do it

## 2024-01-09 NOTE — Telephone Encounter (Signed)
 Ok to do

## 2024-01-13 ENCOUNTER — Ambulatory Visit: Admitting: Internal Medicine

## 2024-01-13 ENCOUNTER — Encounter: Payer: Self-pay | Admitting: Internal Medicine

## 2024-01-13 VITALS — BP 136/84 | HR 62 | Temp 98.8°F | Resp 16 | Ht 68.0 in | Wt 327.2 lb

## 2024-01-13 DIAGNOSIS — E118 Type 2 diabetes mellitus with unspecified complications: Secondary | ICD-10-CM

## 2024-01-13 DIAGNOSIS — R42 Dizziness and giddiness: Secondary | ICD-10-CM | POA: Diagnosis not present

## 2024-01-13 DIAGNOSIS — Z9884 Bariatric surgery status: Secondary | ICD-10-CM | POA: Diagnosis not present

## 2024-01-13 DIAGNOSIS — E1169 Type 2 diabetes mellitus with other specified complication: Secondary | ICD-10-CM | POA: Diagnosis not present

## 2024-01-13 DIAGNOSIS — E785 Hyperlipidemia, unspecified: Secondary | ICD-10-CM

## 2024-01-13 DIAGNOSIS — Z8673 Personal history of transient ischemic attack (TIA), and cerebral infarction without residual deficits: Secondary | ICD-10-CM

## 2024-01-13 DIAGNOSIS — I10 Essential (primary) hypertension: Secondary | ICD-10-CM

## 2024-01-13 DIAGNOSIS — Z7985 Long-term (current) use of injectable non-insulin antidiabetic drugs: Secondary | ICD-10-CM | POA: Diagnosis not present

## 2024-01-13 MED ORDER — TIRZEPATIDE 2.5 MG/0.5ML ~~LOC~~ SOAJ: 2.5000 mg | SUBCUTANEOUS | 3 refills | Status: AC

## 2024-01-13 NOTE — Patient Instructions (Signed)
 We will not check labs today.  If you think you need speech therapy let us  know.

## 2024-01-13 NOTE — Assessment & Plan Note (Signed)
 She is on Mounjaro  5 mg, which has decreased her appetite. A dose reduction is considered for better nutritional intake. Reduce Mounjaro  dose to 2.5 mg and monitor blood glucose levels. Continue glimepiride. HGA1c is not at goal and follow up due in Nov which is already scheduled.

## 2024-01-13 NOTE — Assessment & Plan Note (Signed)
 She is seeing surgery to discuss options for weight loss.

## 2024-01-13 NOTE — Assessment & Plan Note (Signed)
 Speech has improved but remains impaired, and visual symptoms persist in the right eye. There is no new weakness or numbness. Consider speech therapy if symptoms persist she wishes to try on her own first. She is now taking statin and had been taking brilinta  inappropriately due to not remembering to take evening dose. She is now taking properly and we discussed ways to help her to stay on track.

## 2024-01-13 NOTE — Assessment & Plan Note (Signed)
 She did discontinue clonidine  due to mental fogginess and some lightheadedness.

## 2024-01-13 NOTE — Assessment & Plan Note (Signed)
 She was prescribed crestor  20 mg daily and will plan recheck lipid panel in November.

## 2024-01-13 NOTE — Progress Notes (Signed)
 Subjective:   Patient ID: Debra [name], female    DOB: [birth year], 48 y.o.   MRN: [id number]  Discussed the use of AI scribe software for clinical note transcription with the patient, who gave verbal consent to proceed.  History of Present Illness Debra [name] is a 48 year old female who presents for follow-up after a recent stroke.  Approximately one week ago, she experienced a stroke, initially identified by speech changes noticed by a friend. Her speech has mostly returned to normal, but she still feels that something is not quite right. She took a week off work to recover and returned yesterday.  She reports issues with her right eye, feeling that it is 'a little off' since the stroke, though she is not experiencing diplopia. She also mentions some difficulty with her right side.  She was not taking her medication as prescribed prior to the stroke, specifically mentioning that she was taking her Berlinta only once a day instead of twice. She has since resumed the correct dosage.  She is currently on Mounjaro  5 mg but feels it is too strong, causing her to lose appetite and feel weak. She struggles with eating, often feeling hungry but finding food unappealing.  She experiences headaches and some weakness, particularly in her left leg, which causes her to hobble when walking. No new clumsiness or changes in fine motor skills.  She was prescribed clonidine  in the hospital but experienced adverse effects such as dizziness and fogginess, leading her to discontinue it.  She is responsible for taking care of her mother, which adds to her stress and workload.  PMH, [facility], social history reviewed and updated  Medication reconciliation done with d/c list and updated as appropriate.   Review of Systems  Constitutional: Negative.   HENT: Negative.    Eyes:  Positive for visual disturbance.  Respiratory:  Negative for cough, chest tightness and shortness of breath.   Cardiovascular:   Negative for chest pain, palpitations and leg swelling.  Gastrointestinal:  Negative for abdominal distention, abdominal pain, constipation, diarrhea, nausea and vomiting.  Musculoskeletal: Negative.   Skin: Negative.   Neurological:  Positive for speech difficulty.  Psychiatric/Behavioral: Negative.      Objective:  Physical Exam Constitutional:      Appearance: She is well-developed. She is obese.  HENT:     Head: Normocephalic and atraumatic.  Cardiovascular:     Rate and Rhythm: Normal rate and regular rhythm.  Pulmonary:     Effort: Pulmonary effort is normal. No respiratory distress.     Breath sounds: Normal breath sounds. No wheezing or rales.  Abdominal:     General: Bowel sounds are normal. There is no distension.     Palpations: Abdomen is soft.     Tenderness: There is no abdominal tenderness.  Musculoskeletal:     Cervical back: Normal range of motion.  Skin:    General: Skin is warm and dry.  Neurological:     Mental Status: She is alert and oriented to person, place, and time.     Cranial Nerves: No cranial nerve deficit.     Sensory: No sensory deficit.     Coordination: Coordination normal.     Comments: Some decreased speech fluidity which is intermittent. Content is normal and minimal word finding difficulty.      Vitals:   01/13/24 0959 01/13/24 1034  BP: (!) 142/88 136/84  Pulse: 62   Resp: 16   Temp: 98.8 F (37.1 C)  SpO2: 99%   Weight: (!) 327 lb 3.2 oz (148.4 kg)   Height: 5' 8 (1.727 m)     Assessment and Plan Assessment & Plan History of ischemic stroke with residual speech and visual symptoms   Speech has improved but remains impaired, and visual symptoms persist in the right eye. There is no new weakness or numbness. Consider speech therapy if symptoms persist she wishes to try on her own first.   Hypertension with medication intolerance and hypotensive episodes   Blood pressure is controlled, but clonidine  caused adverse effects.  There is a risk of rebound hypertension with missed doses. Discontinue clonidine  and monitor blood pressure closely.  Uncontrolled Type 2 diabetes mellitus   She is on Mounjaro  5 mg, which has decreased her appetite. A dose reduction is considered for better nutritional intake. Reduce Mounjaro  dose to 2.5 mg and monitor blood glucose levels. HGA1c is not at goal and follow up due in Nov which is already scheduled.   Morbid Obesity status post gastric banding   She faces challenges with weight management and appetite regulation, possibly due to medication. Adjust Mounjaro  dose to improve appetite.  Chronic left knee pain   There are no new symptoms or changes.

## 2024-01-13 NOTE — Assessment & Plan Note (Signed)
 BP is borderline today and she is taking losartan  25 mg daily. She did not tolerate higher doses in the past. If persistently high will try to titrate again. Hospital started clonidine  0.1 mg BID which she did take only once at home and did not tolerate due to side effects.

## 2024-01-13 NOTE — Assessment & Plan Note (Signed)
 She faces challenges with weight management and appetite regulation, possibly due to medication. Adjust Mounjaro  dose to improve appetite.

## 2024-01-28 NOTE — Telephone Encounter (Signed)
 Error

## 2024-01-30 ENCOUNTER — Ambulatory Visit: Payer: Self-pay | Admitting: Surgery

## 2024-02-02 ENCOUNTER — Encounter: Payer: Self-pay | Admitting: Internal Medicine

## 2024-02-04 ENCOUNTER — Other Ambulatory Visit (HOSPITAL_COMMUNITY): Payer: Self-pay | Admitting: Surgery

## 2024-02-04 ENCOUNTER — Telehealth: Payer: Self-pay

## 2024-02-04 DIAGNOSIS — I159 Secondary hypertension, unspecified: Secondary | ICD-10-CM

## 2024-02-04 DIAGNOSIS — G4733 Obstructive sleep apnea (adult) (pediatric): Secondary | ICD-10-CM

## 2024-02-04 MED ORDER — LOSARTAN POTASSIUM 25 MG PO TABS: 25.0000 mg | ORAL_TABLET | Freq: Every day | ORAL | 3 refills | Status: DC

## 2024-02-05 ENCOUNTER — Telehealth (HOSPITAL_BASED_OUTPATIENT_CLINIC_OR_DEPARTMENT_OTHER): Payer: Self-pay

## 2024-02-05 NOTE — Telephone Encounter (Signed)
 Staff message was sent to scheduling team to help schedule patient a new patient appointment

## 2024-02-05 NOTE — Telephone Encounter (Signed)
   Pre-operative Risk Assessment    Patient Name: Debra [name]  DOB: 1975-09-03 MRN: [id number]   Date of last office visit: N/A Date of next office visit: N/A   Request for Surgical Clearance    Procedure:  Robotic Gastric Bypass  Date of Surgery:  Clearance TBD                                 Surgeon:  Deward Foy, MD Surgeon's Group or Practice Name:  [facility]  Phone number:  [phone] Fax number:  [phone]   Type of Clearance Requested:   - Medical  - Pharmacy:  Hold Aspirin  and Ticagrelor  (Brilinta ) Not indicated   Type of Anesthesia:  General    Additional requests/questions:  Please FAX a note of Cardiac Clearance to [phone] - Attn: [place] Hale, Bariatric Navigator.  Please call [phone] (navigator's direct number) with any questions or concerns related to this clearance request.   Signed, Patrcia Hong L   02/05/2024, 10:08 AM

## 2024-02-05 NOTE — Telephone Encounter (Signed)
   Name: DESARE DUDDY  DOB: 08/22/1975  MRN: [id number]  Primary Cardiologist: None  Chart reviewed as part of pre-operative protocol coverage. Because of Jesiah L Sima's past medical history and time since last visit, she will require a follow-up in-office visit in order to better assess preoperative cardiovascular risk.  Patient will need an appointment with a MD to establish as a new patient, patient with recent stroke on aspirin  and Plavix .  Recommendations regarding holding of these medications will need to come from prescribing provider (neurology).   Pre-op covering staff: - Please schedule appointment and call patient to inform them. If patient already had an upcoming appointment within acceptable timeframe, please add pre-op clearance to the appointment notes so provider is aware. - Please contact requesting surgeon's office via preferred method (i.e, phone, fax) to inform them of need for appointment prior to surgery.  Makayia Duplessis D Dazja Houchin, NP  02/05/2024, 11:24 AM

## 2024-02-09 NOTE — Telephone Encounter (Signed)
 Will have preop APP review if pt needs MD new pt appt or able to be seen on HF 1st schedule.

## 2024-02-09 NOTE — Telephone Encounter (Signed)
 Per preop APP Rosabel ORN. NP: she needs an MD apt, I try to put in my notes if they need MD or not, she had recent stroke she needs to see an MD to establish.

## 2024-02-09 NOTE — Telephone Encounter (Signed)
 Pt has been scheduled new pt appt with Dr. Okey, 02/20/24 @ 9:40. I will update all parties involved.

## 2024-02-10 NOTE — Telephone Encounter (Signed)
 I will update the requesting office to see the notes from Dr. Okey in regard to the pt will also need to see Neurology as well:  Okey Vina GAILS, MD to Me (Selected Message)     02/10/24  8:17 AM I will see on 10/31.   Surgery services needs to be aware that pt also needs to see neuro to assess timing/clearance for surgery given recent CVA Very important

## 2024-02-11 ENCOUNTER — Other Ambulatory Visit (HOSPITAL_COMMUNITY)

## 2024-02-11 ENCOUNTER — Encounter (HOSPITAL_COMMUNITY): Admission: RE | Admit: 2024-02-11 | Source: Ambulatory Visit

## 2024-02-11 ENCOUNTER — Telehealth: Payer: Self-pay

## 2024-02-11 NOTE — Telephone Encounter (Signed)
 Surgical Sudie forms from [facility] were faxed back successfully this morning

## 2024-02-12 NOTE — Telephone Encounter (Signed)
 Forms were fax back on 10/22 successfully

## 2024-02-13 ENCOUNTER — Ambulatory Visit (HOSPITAL_COMMUNITY)
Admission: RE | Admit: 2024-02-13 | Discharge: 2024-02-13 | Disposition: A | Discharge status: Home / Self Care | Source: Ambulatory Visit | Attending: Surgery | Admitting: Surgery

## 2024-02-13 ENCOUNTER — Encounter (HOSPITAL_COMMUNITY)

## 2024-02-13 ENCOUNTER — Encounter (HOSPITAL_COMMUNITY): Payer: Self-pay

## 2024-02-13 DIAGNOSIS — I159 Secondary hypertension, unspecified: Secondary | ICD-10-CM | POA: Diagnosis not present

## 2024-02-13 DIAGNOSIS — G4733 Obstructive sleep apnea (adult) (pediatric): Secondary | ICD-10-CM | POA: Insufficient documentation

## 2024-02-17 ENCOUNTER — Encounter: Admitting: Dietician

## 2024-02-19 ENCOUNTER — Telehealth: Payer: Self-pay | Admitting: Internal Medicine

## 2024-02-19 NOTE — Telephone Encounter (Signed)
 Patient dropped off document FMLA, to be filled out by provider. Patient requested to send it back via Call Patient to pick up within 7-days. Document is located in providers tray at front office.Please advise at [phone]

## 2024-02-19 NOTE — Telephone Encounter (Signed)
 Pt called to reschedule appt due to not being able to make it in tomorrow. Pt's new appt date is 11/13 with Dr. Wendel. Please advise

## 2024-02-19 NOTE — Telephone Encounter (Signed)
 See notes pt has appt 03/04/24 with Dr. Wendel.

## 2024-02-20 ENCOUNTER — Telehealth: Payer: Self-pay | Admitting: Internal Medicine

## 2024-02-20 ENCOUNTER — Ambulatory Visit: Admitting: Internal Medicine

## 2024-02-20 NOTE — Telephone Encounter (Signed)
 Patient dropped off document FMLA, to be filled out by provider. Patient requested to send it back via Call Patient to pick up within 7-days. Document is located in providers tray at front office.Please advise at [phone]

## 2024-02-20 NOTE — Telephone Encounter (Signed)
 Pt dropped off the wrong forms to be filled out. Contacted her and she has been made aware and will drop off correct forms to be filled out.

## 2024-02-26 ENCOUNTER — Encounter: Attending: Surgery | Admitting: Dietician

## 2024-02-26 ENCOUNTER — Encounter: Payer: Self-pay | Admitting: Dietician

## 2024-02-26 VITALS — Ht 68.0 in | Wt 338.9 lb

## 2024-02-26 DIAGNOSIS — E118 Type 2 diabetes mellitus with unspecified complications: Secondary | ICD-10-CM | POA: Diagnosis not present

## 2024-02-26 DIAGNOSIS — E669 Obesity, unspecified: Secondary | ICD-10-CM | POA: Insufficient documentation

## 2024-02-26 NOTE — Progress Notes (Signed)
 Nutrition Assessment for Bariatric Surgery: Pre-Surgery Behavioral and Nutrition Intervention Program   Medical Nutrition Therapy  Appt Start Time: 1640    End Time: 1741  Patient was seen on 02/26/2024 for Pre-Operative Nutrition Assessment. Purpose of todays visit  enhance perioperative outcomes along with a healthy weight maintenance   Referral stated Supervised Weight Loss (SWL) visits needed: 3 Months  Planned surgery: conversion from adjustable gastric band to gastric by-pass Pt expectation of surgery: first of all to be healthy, well and move around easier  NUTRITION ASSESSMENT   Anthropometrics  Start weight at [facility]: 338.9 lbs (date: 02/26/2024) Height: 68 in BMI: 51.53 kg/m2     Clinical   Pharmacotherapy: History of weight loss medication used: currently on Mounjaro   Medical hx: DM, stoke, HTN, sleep apnea, arthritis  Medications: albuterol  (PROAIR  HFA) 108 (90 Base) MCG/ACT inhaler albuterol  (PROVENTIL ) (2.5 MG/3ML) 0.083% nebulizer solution aspirin  EC 81 MG tablet Blood Pressure Monitor MISC EPINEPHrine  0.3 mg/0.3 mL IJ SOAJ injection fexofenadine (ALLEGRA) 180 MG tablet fluticasone  (FLONASE ) 50 MCG/ACT nasal spray glimepiride (AMARYL) 2 MG tablet losartan  (COZAAR ) 25 MG tablet montelukast  (SINGULAIR ) 10 MG tablet rosuvastatin  (CRESTOR ) 20 MG tablet senna (SENOKOT) 8.6 MG TABS tablet ticagrelor  (BRILINTA ) 90 MG TABS tablet tirzepatide  (MOUNJARO ) 2.5 MG/0.5ML Pen  Labs: LDL 109; glucose 199; A1c 8.5 Notable signs/symptoms: none noted Allergies: many fruits and nuts Any previous deficiencies? No  Evaluation of Nutritional Deficiencies: Micronutrient Nutrition Focused Physical Exam: Hair: No issues observed Eyes: No issues observed Mouth: No issues observed Neck: No issues observed Nails: No issues observed Skin: No issues observed  Lifestyle & Dietary Hx Pt states she fell recently, stating she was just rushing. Pt states she needs to just slow down,  stating she was trying to get to work on time.  Pt states she still has a restriction on her gastric band, stating it has been tight, even though she has not been back for more fluid fillings. Pt states she has not gone back to her initial weight of 360 lbs when she got the gastric band. Pt states she goes on and off Mounjaro , stating she does not eat at all when she is on it, in addition to her gastric band. Pt states she is allergic to most fruits and nuts   Current Physical Activity Recommendations state 150 minutes per week of moderate to vigorous movement including Cardio and 1-2 days of resistance activities as well as flexibility/balance activities:  Pts current physical activity: ADLs, with 0% recommendation reached   Sleep Hygiene: duration and quality: uses a CPAP machine, stating some times she has trouble going to sleep and staying asleep. Pt states overall it is pretty good.  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) Stress Management Techniques: deep breathing, pray  According to the Dietary Guidelines for Americans Recommendation: equivalent 1.5-2 cups fruits per day, equivalent 2-3 cups vegetables per day and at least half all grains whole  Fruit servings per day (on average): 0, meeting 0% recommendation  Non-starchy vegetable servings per day (on average): 1-2, meeting 566-100% recommendation  Whole Grains per day (on average): 1  Number of meals missed/skipped per week out of 21: 4  24-Hr Dietary Recall First Meal: biscuit and bo-rounds ([employer]) Snack:  Second Meal: pasta or salad (lettuce and tomatoes) or skip Snack:  Third Meal: skip Snack: popcorn or chips Beverages: Coke zero, water  Alcoholic beverages per week:  0   Estimated Energy Needs Calories: 1500  NUTRITION DIAGNOSIS  Overweight/obesity ([place]-3.3) related to past poor dietary habits and physical inactivity as evidenced  by patient w/ planned conversion from gastric band to gastric by-pass surgery following dietary guidelines for continued weight loss.  NUTRITION INTERVENTION  Nutrition counseling (C-1) and education (E-2) to facilitate bariatric surgery goals.  Educated pt on micronutrient deficiencies post-surgery and behavioral/dietary strategies to start in order to mitigate that risk   Behavioral and Dietary Interventions Pre-Op Goals Reviewed with the Patient Nutrition: Healthy Eating Behaviors Switch to non-caloric, non-carbonated and non-caffeinated beverages such as  water, unsweetened tea, Crystal Light and zero calorie beverages (aim for 64 oz. per day) Cut out grazing between meals or at night  Find a protein shake you like Eat every 3-5 hours        Eliminate distractions while eating (TV, computer, reading, driving, texting) Take 79-69 minutes to eat a meal  Decrease high sugar foods/decrease high fat/fried foods Eliminate alcoholic beverages Increase protein intake (eggs, fish, chicken, yogurt) before surgery Eat non starchy vegetables 2 times a day 7 days a week Eat complex carbohydrates such as whole grains and fruits   Behavioral Modification: Physical Activity Increase my usual daily activity (use stairs, park farther, etc.) Engage in _______________________  activity  _______ minutes ______ times per week  Other:    _________________________________________________________________     Problem Solving I will think about my usual eating patterns and how to tweak them How can my friends and family support me Barriers to starting my changes Learn and understand appetite verses hunger   Healthy Coping Allow for ___________ activities per week to help me manage stress Reframe negative thoughts I will keep a picture of someone or something that is my inspiration & look at it daily   Monitoring  Weigh myself once a week  Measure my progress by monitoring how my clothes fit Keep a  food record of what I eat and drink for the next ________ (time period) Take pictures of what I eat and drink for the next ________ (time period) Use an app to count steps/day for the next_______ (time period) Measure my progress such as increased energy and more restful sleep Monitor your acid reflux and bowel habits, are they getting better?   *Goals that are bolded indicate the pt would like to start working towards these  Handouts Provided Include  Bariatric Surgery handouts (Nutrition Visits, Pre Surgery Behavioral Change Goals, Protein Shakes Brands to Choose From, Vitamins & Mineral Supplementation)  Learning Style & Readiness for Change Teaching method utilized: Visual, Auditory, and hands on  Demonstrated degree of understanding via: Teach Back  Readiness Level: preparation Barriers to learning/adherence to lifestyle change: nothing identified  RD's Notes for Next Visit Patient progress toward chosen goals   MONITORING & EVALUATION Dietary intake, weekly physical activity, body weight, and preoperative behavioral change goals   Next Steps  Patient is to follow up at [facility] in 2-3 weeks for first SWL visit.

## 2024-02-26 NOTE — Telephone Encounter (Signed)
 Paperwork has been received and placed in provider's tray.

## 2024-03-01 ENCOUNTER — Encounter: Payer: Self-pay | Admitting: Internal Medicine

## 2024-03-01 ENCOUNTER — Ambulatory Visit: Admitting: Internal Medicine

## 2024-03-01 VITALS — BP 140/100 | HR 60 | Temp 97.9°F | Ht 68.0 in | Wt 334.0 lb

## 2024-03-01 DIAGNOSIS — I10 Essential (primary) hypertension: Secondary | ICD-10-CM | POA: Diagnosis not present

## 2024-03-01 DIAGNOSIS — Z7985 Long-term (current) use of injectable non-insulin antidiabetic drugs: Secondary | ICD-10-CM | POA: Diagnosis not present

## 2024-03-01 DIAGNOSIS — Z9884 Bariatric surgery status: Secondary | ICD-10-CM | POA: Diagnosis not present

## 2024-03-01 DIAGNOSIS — E118 Type 2 diabetes mellitus with unspecified complications: Secondary | ICD-10-CM | POA: Diagnosis not present

## 2024-03-01 DIAGNOSIS — M150 Primary generalized (osteo)arthritis: Secondary | ICD-10-CM

## 2024-03-01 LAB — COMPREHENSIVE METABOLIC PANEL WITH GFR
ALT: 13 U/L (ref 0–35)
AST: 10 U/L (ref 0–37)
Albumin: 4 g/dL (ref 3.5–5.2)
Alkaline Phosphatase: 86 U/L (ref 39–117)
BUN: 11 mg/dL (ref 6–23)
CO2: 29 meq/L (ref 19–32)
Calcium: 8.9 mg/dL (ref 8.4–10.5)
Chloride: 105 meq/L (ref 96–112)
Creatinine, Ser: 0.84 mg/dL (ref 0.40–1.20)
GFR: 81.35 mL/min (ref >60.00)
Glucose, Bld: 140 mg/dL — ABNORMAL HIGH (ref 70–99)
Potassium: 3.6 meq/L (ref 3.5–5.1)
Sodium: 141 meq/L (ref 135–145)
Total Bilirubin: 0.3 mg/dL (ref 0.2–1.2)
Total Protein: 7 g/dL (ref 6.0–8.3)

## 2024-03-01 LAB — HEMOGLOBIN A1C: Hgb A1c MFr Bld: 8.2 % — ABNORMAL HIGH (ref 4.6–6.5)

## 2024-03-01 NOTE — Progress Notes (Deleted)
 Cardiology Office Note:   Date:  03/01/2024  ID:  Debra [name], DOB [birth year], MRN [id number] PCP:  Rollene Almarie LABOR, MD  [facility] Providers Cardiologist:  Wendel Haws, MD Referring MD: Rollene Almarie LABOR, *  Chief Complaint/Reason for Referral: Preoperative cardiovascular examination prior to bariatric surgery ASSESSMENT:    1. Preoperative cardiovascular examination   2. Cerebrovascular accident (CVA) due to embolism of left middle cerebral artery (HCC)   3. Type 2 diabetes mellitus with complication, without long-term current use of insulin  (HCC)   4. Hypertension associated with diabetes (HCC)   5. Hyperlipidemia associated with type 2 diabetes mellitus (HCC)   6. BMI 50.0-59.9, adult (HCC)     PLAN:   In order of problems listed above: Preoperative cardiovascular assessment: Will obtain PET stress test to evaluate further.  Her echocardiogram recently was reassuring.  Only if high risk findings are seen would any further evaluation be needed prior to elective gastric bypass surgery. History of CVA: I reviewed the patient's TEE with bubble study.  It is unclear whether she harbors a PFO or not.  I do not see an intra-atrial septal aneurysm on her TTE.  I will refer the patient for a 30-day monitor to exclude atrial fibrillation.  Following her surgery we will consider TEE to evaluate further.  For now continue Brilinta  90 mg twice daily, aspirin  81 mg T2DM: Continue aspirin  81 mg, rosuvastatin  with dose adjustment as below, will consider Jardiance  after gastric bypass.  Continue losartan  25 mg. Hypertension: Continue losartan  25 mg*** Hyperlipidemia: Increase rosuvastatin  40 mg as patient's LDL was 109 in September.  Given history of stroke goal LDL is less than 55.  Check lipid panel, LFTs, LP(a) in 2 months.*** Elevated BMI: Continue Mounjaro  2.5 mg q. weekly       {Are you ordering a CV Procedure (e.g. stress test, cath, DCCV, TEE, etc)?   Press F2         :[id number]}   Dispo:  No follow-ups on file.       I spent *** minutes reviewing all clinical data during and prior to this visit including all relevant imaging studies, laboratories, clinical information from other health systems and prior notes from both Cardiology and other specialties, interviewing the patient, conducting a complete physical examination, and coordinating care in order to formulate a comprehensive and personalized evaluation and treatment plan.   History of Present Illness:    FOCUSED PROBLEM LIST:   History of recurrent stroke CVA 2019 14 mm left parietal occipital hypoattenuation head CT No LVO, high-grade right M2 and left M2 lesions CTA Multiple acute infarctions posterior left temporal lobe and temporoparietal junction MRI Left MCA stent by IR Speech delay September 2025 Age indeterminant left frontal lobe white matter, left basal ganglia hypodensity, chronic watershed infarcts head CT No LVO, patent left MCA stent, severe chronic stenosis distal right MCA M1 CTA 2.0 x 1.1 cm acute infarct left basal ganglia MRI Nondiagnostic bubble study, no significant valve issues EF 70 to 75% TTE Presentation thought due to incorrect dosing of ticagrelor  T2DM Not on insulin  Hypertension Hyperlipidemia BMI 51 OSA On CPAP  November 2025:  Patient consents to use of AI scribe. The patient is a 48 year old female with the above listed medical problems referred for recommendations regarding preoperative evaluation prior to gastric bypass surgery which is scheduled for January.  The patient most recently presented to [facility] in September of this year with speech difficulties.  Her imaging studies as detailed  above demonstrated new left basal ganglia infarct.  She had been taking Brilinta  only once a day.  An echocardiogram was done during that admission however the bubble study portion was nondiagnostic.  Of note this was her second stroke.  She underwent left MCA  stenting in 2019 as detailed above.       Current Medications: No outpatient medications have been marked as taking for the 03/04/24 encounter (Appointment) with Saajan Willmon K, MD.     Review of Systems:   Please see the history of present illness.    All other systems reviewed and are negative.     EKGs/Labs/Other Test Reviewed:   EKG: 2025 sinus rhythm with nonspecific ST and T wave changes  EKG Interpretation Date/Time:    Ventricular Rate:    PR Interval:    QRS Duration:    QT Interval:    QTC Calculation:   R Axis:      Text Interpretation:          CARDIAC STUDIES: Refer to CV Procedures and Imaging Tabs   Risk Assessment/Calculations:   {Does this patient have ATRIAL FIBRILLATION?[phone]}      Physical Exam:   VS:  LMP 07/28/2011    No BP recorded.  {Refresh Note OR Click here to enter BP  :1}***   Wt Readings from Last 3 Encounters:  02/26/24 (!) 338 lb 14.4 oz (153.7 kg)  01/13/24 (!) 327 lb 3.2 oz (148.4 kg)  11/24/23 (!) 337 lb (152.9 kg)      GENERAL:  No apparent distress, AOx3 HEENT:  No carotid bruits, +2 carotid impulses, no scleral icterus CAR: RRR Irregular RR*** no murmurs***, gallops, rubs, or thrills RES:  Clear to auscultation bilaterally ABD:  Soft, nontender, nondistended, positive bowel sounds x 4 VASC:  +2 radial pulses, +2 carotid pulses NEURO:  CN 2-12 grossly intact; motor and sensory grossly intact PSYCH:  No active depression or anxiety EXT:  No edema, ecchymosis, or cyanosis  Signed, Trevelle Mcgurn K Nancy Manuele, MD  03/01/2024 7:29 AM    [facility] [address] [place], [place], [place]  [address] Phone: [phone]; Fax: [phone]   Note:  This document was prepared using Dragon voice recognition software and may include unintentional dictation errors.

## 2024-03-01 NOTE — Progress Notes (Unsigned)
 Subjective:   Patient ID: Debra [name], female    DOB: 19-Dec-1975, 48 y.o.   MRN: [id number]  Discussed the use of AI scribe software for clinical note transcription with the patient, who gave verbal consent to proceed. History of Present Illness Debra [name] is a 48 year old female who presents with a request for FMLA accommodations due to personal and caregiving responsibilities.  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 She is consistently late to work because she waits for her brother to arrive and take over caregiving duties. She requests a work start time of 8:15 AM to accommodate this schedule.  She feels tired and in need of rest, indicating that her current schedule is impacting her ability to rest adequately. She is considering requesting a couple of days off to recuperate.  Regarding her diabetes management, she has been off her medication due to a supply issue but has resumed taking her shots. She is currently taking Vemuride and monitors her blood sugar levels, which can drop unexpectedly, causing her to feel the need to eat even when her blood sugar is at a normal level.  She experiences intermittent shortness of breath. She also mentions being out of her prescription for an 800 mg medication, which she could not refill due to having Celebrex , and expresses concern about the potential for stomach bleeding from long-term use of certain medications.  She reports feeling winded at times, which comes and goes, and is unsure if it is related to allergies or another issue.  FMLA for herself - intermittent 20 minutes 20 days per month FMLA for mom - intermittent x days per month  Review of Systems  Constitutional: Negative.   HENT: Negative.    Eyes: Negative.   Respiratory:  Negative for cough, chest tightness and shortness of breath.   Cardiovascular:  Negative for chest pain, palpitations and leg  swelling.  Gastrointestinal:  Negative for abdominal distention, abdominal pain, constipation, diarrhea, nausea and vomiting.  Musculoskeletal:  Positive for arthralgias.  Skin: Negative.   Neurological: Negative.   Psychiatric/Behavioral: Negative.      Objective:  Physical Exam Constitutional:      Appearance: She is well-developed.  HENT:     Head: Normocephalic and atraumatic.  Cardiovascular:     Rate and Rhythm: Normal rate and regular rhythm.  Pulmonary:     Effort: Pulmonary effort is normal. No respiratory distress.     Breath sounds: Normal breath sounds. No wheezing or rales.  Abdominal:     General: Bowel sounds are normal. There is no distension.     Palpations: Abdomen is soft.     Tenderness: There is no abdominal tenderness.  Musculoskeletal:        General: Tenderness present.     Cervical back: Normal range of motion.  Skin:    General: Skin is warm and dry.  Neurological:     Mental Status: She is alert and oriented to person, place, and time.     Coordination: Coordination normal.     Vitals:   03/01/24 1536 03/01/24 1602  BP: (!) 140/90 (!) 140/100  Pulse: 60   Temp: 97.9 F (36.6 C)   TempSrc: Temporal   SpO2: 99%   Weight: (!) 334 lb (151.5 kg)   Height: 5' 8 (1.727 m)     Assessment and Plan Assessment & Plan Type 2 Diabetes Mellitus   Blood glucose levels are  fluctuating, with a recent reading of 110 mg/dL. She just took her shot of Mounjaro  2.5 mg while attempting dietary management. A1c was checked for glycemic control.  Hypertension   Blood pressure is borderline and requires monitoring. Blood pressure was rechecked.  Chronic Left Knee Pain   Chronic pain persists. She is using Celebrex  but there is concern for gastrointestinal bleeding risks. Advised against long-term Celebrex  use due to gastrointestinal bleeding risk.  General Health Maintenance   She experiences intermittent shortness of breath, possibly related to allergies or  respiratory issues. She is seeking work accommodations for caregiving and health needs. Started FMLA and accommodation forms for work.

## 2024-03-01 NOTE — Patient Instructions (Signed)
 We will check the labs today and will get the FMLA papers done. Depending on if this helps you may need to consider accomodation as well with your work.

## 2024-03-03 ENCOUNTER — Ambulatory Visit: Payer: Self-pay | Admitting: Internal Medicine

## 2024-03-04 ENCOUNTER — Ambulatory Visit: Admitting: Internal Medicine

## 2024-03-04 DIAGNOSIS — I152 Hypertension secondary to endocrine disorders: Secondary | ICD-10-CM

## 2024-03-04 DIAGNOSIS — Z6841 Body Mass Index (BMI) 40.0 and over, adult: Secondary | ICD-10-CM

## 2024-03-04 DIAGNOSIS — Z01810 Encounter for preprocedural cardiovascular examination: Secondary | ICD-10-CM

## 2024-03-04 DIAGNOSIS — I63412 Cerebral infarction due to embolism of left middle cerebral artery: Secondary | ICD-10-CM

## 2024-03-04 DIAGNOSIS — E1169 Type 2 diabetes mellitus with other specified complication: Secondary | ICD-10-CM

## 2024-03-04 DIAGNOSIS — E118 Type 2 diabetes mellitus with unspecified complications: Secondary | ICD-10-CM

## 2024-03-04 NOTE — Assessment & Plan Note (Signed)
 Reiterated that NSAIDs are not indicated for regular use due to this as well as her aspirin  and brilinta .

## 2024-03-04 NOTE — Assessment & Plan Note (Addendum)
 BP was elevated today and is normal at home. She did not take meds this morning due to urination. Continue and monitor closely. Checking CMP

## 2024-03-04 NOTE — Assessment & Plan Note (Signed)
 Chronic pain persists. She is using Celebrex  but there is concern for gastrointestinal bleeding risks. Advised against long-term Celebrex  use due to gastrointestinal bleeding risk.

## 2024-03-04 NOTE — Assessment & Plan Note (Signed)
 Blood glucose levels are fluctuating, with a recent reading of 110 mg/dL. She just took her shot of Mounjaro  2.5 mg while attempting dietary management. A1c was checked for glycemic control. Adjust as needed.

## 2024-03-09 ENCOUNTER — Telehealth: Payer: Self-pay

## 2024-03-09 NOTE — Telephone Encounter (Signed)
 Paper work has been signed and faxed. Pt is aware. Requested originals be sent to her via mail which has been sent.

## 2024-03-09 NOTE — Telephone Encounter (Signed)
 Paperwork sign and faxed. Pt is aware. Requested originals be mailed to her which have been sent.

## 2024-03-16 ENCOUNTER — Encounter: Admitting: Dietician

## 2024-03-19 ENCOUNTER — Encounter: Payer: Self-pay | Admitting: Internal Medicine

## 2024-03-20 ENCOUNTER — Telehealth: Admitting: Nurse Practitioner

## 2024-03-20 DIAGNOSIS — J069 Acute upper respiratory infection, unspecified: Secondary | ICD-10-CM

## 2024-03-20 MED ORDER — MONTELUKAST SODIUM 10 MG PO TABS: 10.0000 mg | ORAL_TABLET | Freq: Every day | ORAL | 0 refills | Status: DC

## 2024-03-20 MED ORDER — PREDNISONE 20 MG PO TABS: 40.0000 mg | ORAL_TABLET | Freq: Every day | ORAL | 0 refills | Status: AC

## 2024-03-20 MED ORDER — PROMETHAZINE-DM 6.25-15 MG/5ML PO SYRP: 5.0000 mL | ORAL_SOLUTION | Freq: Four times a day (QID) | ORAL | 0 refills | Status: AC | PRN

## 2024-03-20 NOTE — Progress Notes (Signed)
 Virtual Visit Consent   Debra [name], you are scheduled for a virtual visit with a [facility] provider today. Just as with appointments in the office, your consent must be obtained to participate. Your consent will be active for this visit and any virtual visit you may have with one of our providers in the next 365 days. If you have a MyChart account, a copy of this consent can be sent to you electronically.  As this is a virtual visit, video technology does not allow for your provider to perform a traditional examination. This may limit your provider's ability to fully assess your condition. If your provider identifies any concerns that need to be evaluated in person or the need to arrange testing (such as labs, EKG, etc.), we will make arrangements to do so. Although advances in technology are sophisticated, we cannot ensure that it will always work on either your end or our end. If the connection with a video visit is poor, the visit may have to be switched to a telephone visit. With either a video or telephone visit, we are not always able to ensure that we have a secure connection.  By engaging in this virtual visit, you consent to the provision of healthcare and authorize for your insurance to be billed (if applicable) for the services provided during this visit. Depending on your insurance coverage, you may receive a charge related to this service.  I need to obtain your verbal consent now. Are you willing to proceed with your visit today? Debra [name] has provided verbal consent on 03/20/2024 for a virtual visit (video or telephone). Debra LELON Servant, NP  Date: 03/20/2024 12:01 PM   Virtual Visit via Video Note   I, Debra [name], connected with  Debra [name]  ([id number], [birth year]) on 03/20/24 at 12:00 PM EST by a video-enabled telemedicine application and verified that I am speaking with the correct person using two identifiers.  Location: Patient: Virtual Visit Location  Patient: Home Provider: Virtual Visit Location Provider: Home Office   I discussed the limitations of evaluation and management by telemedicine and the availability of in person appointments. The patient expressed understanding and agreed to proceed.    History of Present Illness: Debra [name] is a 48 y.o. who identifies as a female who was assigned female at birth, and is being seen today for URI with cough and congestion .  Over the past 7 days Debra [name] has been experiencing the following symptoms: sinus pressure, runny nose, nasal congestion, coughing which is worse at night when trying to use her CPAP. Denies fever. Taking sudafed, using flonase  spray, OTC antihistamine.She is not a smoker but has a history of allergies and asthma.     Problems:  Patient Active Problem List   Diagnosis Date Noted   Lightheadedness 04/14/2019   Allergic reaction 03/02/2019   Menopausal vasomotor syndrome 01/08/2019   OSA (obstructive sleep apnea) 01/08/2019   Hot flashes 10/04/2017   Stenosis of both middle cerebral arteries 07/07/2017   Hyperlipidemia associated with type 2 diabetes mellitus (HCC)    Hx of completed stroke 07/01/2017   Left shoulder pain 05/21/2016   Encounter for general adult medical examination with abnormal findings 10/13/2015   Hypertension 08/26/2011   Hx of laparoscopic gastric banding, 10/02/2010. 11/27/2010   Diabetes mellitus type 2 with complications (HCC) 08/23/2009   Morbid obesity (HCC) 08/22/2009   Allergic rhinitis 08/22/2009   Asthma 08/22/2009   Osteoarthritis 08/22/2009    Allergies:  Allergies  Allergen Reactions   Peanut-Containing Drug Products Anaphylaxis   Strawberry (Diagnostic) Anaphylaxis   Morphine  Hives   Augmentin  [Amoxicillin -Pot Clavulanate] Hives and Rash    To bilat post hands only   Influenza Vaccines Itching and Rash   Metformin  And Related Diarrhea   Medications:  Current Outpatient Medications:    predniSONE  (DELTASONE ) 20 MG  tablet, Take 2 tablets (40 mg total) by mouth daily with breakfast for 5 days., Disp: 10 tablet, Rfl: 0   promethazine-dextromethorphan (PROMETHAZINE-DM) 6.25-15 MG/5ML syrup, Take 5 mLs by mouth 4 (four) times daily as needed., Disp: 240 mL, Rfl: 0   albuterol  (PROAIR  HFA) 108 (90 Base) MCG/ACT inhaler, Inhale 2 puffs into the lungs every 4 (four) hours as needed for wheezing or shortness of breath., Disp: 18 g, Rfl: 2   albuterol  (PROVENTIL ) (2.5 MG/3ML) 0.083% nebulizer solution, Take 3 mLs (2.5 mg total) by nebulization every 6 (six) hours as needed for wheezing or shortness of breath., Disp: 75 mL, Rfl: 3   aspirin  EC 81 MG tablet, Take 81 mg by mouth daily. Swallow whole., Disp: , Rfl:    Blood Pressure Monitor MISC, Use as directed, Disp: 1 each, Rfl: 0   EPINEPHrine  0.3 mg/0.3 mL IJ SOAJ injection, Inject 0.3 mLs (0.3 mg total) into the muscle as needed for anaphylaxis., Disp: 1 each, Rfl: 1   fexofenadine (ALLEGRA) 180 MG tablet, Take 180 mg by mouth daily. , Disp: , Rfl:    fluticasone  (FLONASE ) 50 MCG/ACT nasal spray, Place 2 sprays into both nostrils daily as needed for allergies., Disp: 48 g, Rfl: 3   glimepiride (AMARYL) 2 MG tablet, Take 4 mg by mouth daily with breakfast., Disp: , Rfl:    losartan  (COZAAR ) 25 MG tablet, Take 1 tablet (25 mg total) by mouth daily., Disp: 90 tablet, Rfl: 3   montelukast  (SINGULAIR ) 10 MG tablet, Take 1 tablet (10 mg total) by mouth at bedtime., Disp: 30 tablet, Rfl: 0   rosuvastatin  (CRESTOR ) 20 MG tablet, Take 1 tablet (20 mg total) by mouth daily., Disp: 30 tablet, Rfl: 0   senna (SENOKOT) 8.6 MG TABS tablet, Take 1 tablet by mouth daily., Disp: , Rfl:    ticagrelor  (BRILINTA ) 90 MG TABS tablet, Take 0.5 tablet (45 mg total) by mouth 2 (two) times daily., Disp: 30 tablet, Rfl: 1   tirzepatide  (MOUNJARO ) 2.5 MG/0.5ML Pen, Inject 2.5 mg into the skin once a week., Disp: 2 mL, Rfl: 3  Observations/Objective: Patient is well-developed, well-nourished in  no acute distress.  Resting comfortably at home.  Head is normocephalic, atraumatic.  No labored breathing.  Speech is clear and coherent with logical content.  Patient is alert and oriented at baseline.    Assessment and Plan: 1. URI with cough and congestion (Primary) - promethazine-dextromethorphan (PROMETHAZINE-DM) 6.25-15 MG/5ML syrup; Take 5 mLs by mouth 4 (four) times daily as needed.  Dispense: 240 mL; Refill: 0 - predniSONE  (DELTASONE ) 20 MG tablet; Take 2 tablets (40 mg total) by mouth daily with breakfast for 5 days.  Dispense: 10 tablet; Refill: 0 - montelukast  (SINGULAIR ) 10 MG tablet; Take 1 tablet (10 mg total) by mouth at bedtime.  Dispense: 30 tablet; Refill: 0    Follow Up Instructions: I discussed the assessment and treatment plan with the patient. The patient was provided an opportunity to ask questions and all were answered. The patient agreed with the plan and demonstrated an understanding of the instructions.  A copy of instructions were sent to the patient  via MyChart unless otherwise noted below.    The patient was advised to call back or seek an in-person evaluation if the symptoms worsen or if the condition fails to improve as anticipated.    Renelle Stegenga W Olyver Hawes, NP

## 2024-03-20 NOTE — Patient Instructions (Signed)
 Debra [name], thank you for joining Haze LELON Servant, NP for today's virtual visit.  While this provider is not your primary care provider (PCP), if your PCP is located in our provider database this encounter information will be shared with them immediately following your visit.   A [facility] MyChart account gives you access to today's visit and all your visits, tests, and labs performed at [facility]  click here if you don't have a  MyChart account or go to [email or web address]  Consent: (Patient) Debra [name] provided verbal consent for this virtual visit at the beginning of the encounter.  Current Medications:  Current Outpatient Medications:    predniSONE  (DELTASONE ) 20 MG tablet, Take 2 tablets (40 mg total) by mouth daily with breakfast for 5 days., Disp: 10 tablet, Rfl: 0   promethazine -dextromethorphan (PROMETHAZINE -DM) 6.25-15 MG/5ML syrup, Take 5 mLs by mouth 4 (four) times daily as needed., Disp: 240 mL, Rfl: 0   albuterol  (PROAIR  HFA) 108 (90 Base) MCG/ACT inhaler, Inhale 2 puffs into the lungs every 4 (four) hours as needed for wheezing or shortness of breath., Disp: 18 g, Rfl: 2   albuterol  (PROVENTIL ) (2.5 MG/3ML) 0.083% nebulizer solution, Take 3 mLs (2.5 mg total) by nebulization every 6 (six) hours as needed for wheezing or shortness of breath., Disp: 75 mL, Rfl: 3   aspirin  EC 81 MG tablet, Take 81 mg by mouth daily. Swallow whole., Disp: , Rfl:    Blood Pressure Monitor MISC, Use as directed, Disp: 1 each, Rfl: 0   EPINEPHrine  0.3 mg/0.3 mL IJ SOAJ injection, Inject 0.3 mLs (0.3 mg total) into the muscle as needed for anaphylaxis., Disp: 1 each, Rfl: 1   fexofenadine (ALLEGRA) 180 MG tablet, Take 180 mg by mouth daily. , Disp: , Rfl:    fluticasone  (FLONASE ) 50 MCG/ACT nasal spray, Place 2 sprays into both nostrils daily as needed for allergies., Disp: 48 g, Rfl: 3   glimepiride (AMARYL) 2 MG tablet, Take 4 mg by mouth daily with  breakfast., Disp: , Rfl:    losartan  (COZAAR ) 25 MG tablet, Take 1 tablet (25 mg total) by mouth daily., Disp: 90 tablet, Rfl: 3   montelukast  (SINGULAIR ) 10 MG tablet, Take 1 tablet (10 mg total) by mouth at bedtime., Disp: 30 tablet, Rfl: 0   rosuvastatin  (CRESTOR ) 20 MG tablet, Take 1 tablet (20 mg total) by mouth daily., Disp: 30 tablet, Rfl: 0   senna (SENOKOT) 8.6 MG TABS tablet, Take 1 tablet by mouth daily., Disp: , Rfl:    ticagrelor  (BRILINTA ) 90 MG TABS tablet, Take 0.5 tablet (45 mg total) by mouth 2 (two) times daily., Disp: 30 tablet, Rfl: 1   tirzepatide  (MOUNJARO ) 2.5 MG/0.5ML Pen, Inject 2.5 mg into the skin once a week., Disp: 2 mL, Rfl: 3   Medications ordered in this encounter:  Meds ordered this encounter  Medications   promethazine -dextromethorphan (PROMETHAZINE -DM) 6.25-15 MG/5ML syrup    Sig: Take 5 mLs by mouth 4 (four) times daily as needed.    Dispense:  240 mL    Refill:  0    Supervising Provider:   BLAISE ALEENE KIDD [[id number]]   predniSONE  (DELTASONE ) 20 MG tablet    Sig: Take 2 tablets (40 mg total) by mouth daily with breakfast for 5 days.    Dispense:  10 tablet    Refill:  0    Supervising Provider:   BLAISE ALEENE KIDD [[id number]]   montelukast  (SINGULAIR ) 10 MG tablet    Sig:  Take 1 tablet (10 mg total) by mouth at bedtime.    Dispense:  30 tablet    Refill:  0    Supervising Provider:   LAMPTEY, PHILIP O [[id number]]     *If you need refills on other medications prior to your next appointment, please contact your pharmacy*  Follow-Up: Call back or seek an in-person evaluation if the symptoms worsen or if the condition fails to improve as anticipated.  [facility] [phone]  Other Instructions INSTRUCTIONS: use a humidifier for nasal congestion Drink plenty of fluids, rest and wash hands frequently to avoid the spread of infection Alternate tylenol  and Motrin  for relief of fever    If you have been instructed to have an in-person  evaluation today at a local Urgent Care facility, please use the link below. It will take you to a list of all of our available [facility], including address, phone number and hours of operation. Please do not delay care.  [facility]  If you or a family member do not have a primary care provider, use the link below to schedule a visit and establish care. When you choose a [facility] primary care physician or advanced practice provider, you gain a long-term partner in health. Find a Primary Care Provider  Learn more about [facility]'s in-office and virtual care options: [facility] - Get Care Now

## 2024-03-22 NOTE — Progress Notes (Deleted)
 Cardiology Office Note:   Date:  03/22/2024  ID:  Debra [name], DOB 11/01/1975, MRN [id number] PCP:  Rollene Almarie LABOR, MD  [facility] Providers Cardiologist:  Wendel Haws, MD Referring MD: Rollene Almarie LABOR, *  Chief Complaint/Reason for Referral: Preoperative cardiovascular examination prior to bariatric surgery ASSESSMENT:    1. Preoperative cardiovascular examination   2. Cerebrovascular accident (CVA), unspecified mechanism (HCC)   3. Type 2 diabetes mellitus with complication, without long-term current use of insulin  (HCC)   4. Hypertension associated with diabetes (HCC)   5. Hyperlipidemia associated with type 2 diabetes mellitus (HCC)   6. CKD stage 2 due to type 2 diabetes mellitus (HCC)   7. BMI 50.0-59.9, adult (HCC)      PLAN:   In order of problems listed above: Preoperative cardiovascular assessment: Will obtain PET stress test to evaluate further.  Her echocardiogram recently was reassuring.  Only if high risk findings are seen would any further evaluation be needed prior to elective gastric bypass surgery. History of CVA: I reviewed the patient's TEE with bubble study.  It is unclear whether she harbors a PFO or not.  I do not see an intra-atrial septal aneurysm on her TTE.  I will refer the patient for a 30-day monitor to exclude atrial fibrillation.  Following her surgery we will consider TEE to evaluate further.  For now continue Brilinta  90 mg twice daily, aspirin  81 mg T2DM: Continue aspirin  81 mg, rosuvastatin  with dose adjustment as below, will consider Jardiance  after gastric bypass.  Continue losartan  25 mg. Hypertension: Continue losartan  25 mg*** Hyperlipidemia: Increase rosuvastatin  40 mg as patient's LDL was 109 in September.  Given history of stroke goal LDL is less than 55.  Check lipid panel, LFTs, LP(a) in 2 months.*** CKD stage II: Continue losartan  25 mg; consider Jardiance  in the future Elevated BMI: Continue Mounjaro  2.5 mg q. weekly        {Are you ordering a CV Procedure (e.g. stress test, cath, DCCV, TEE, etc)?   Press F2        :[id number]}   Dispo:  No follow-ups on file.       I spent *** minutes reviewing all clinical data during and prior to this visit including all relevant imaging studies, laboratories, clinical information from other health systems and prior notes from both Cardiology and other specialties, interviewing the patient, conducting a complete physical examination, and coordinating care in order to formulate a comprehensive and personalized evaluation and treatment plan.   History of Present Illness:    FOCUSED PROBLEM LIST:   History of recurrent stroke CVA 2019 14 mm left parietal occipital hypoattenuation head CT No LVO, high-grade right M2 and left M2 lesions CTA Multiple acute infarctions posterior left temporal lobe and temporoparietal junction MRI Left MCA stent by IR Speech delay September 2025 Age indeterminant left frontal lobe white matter, left basal ganglia hypodensity, chronic watershed infarcts head CT No LVO, patent left MCA stent, severe chronic stenosis distal right MCA M1 CTA 2.0 x 1.1 cm acute infarct left basal ganglia MRI Nondiagnostic bubble study, no significant valve issues EF 70 to 75% TTE Presentation thought due to incorrect dosing of ticagrelor  T2DM Not on insulin  Hypertension Hyperlipidemia CKD stage II BMI 51 OSA On CPAP  November 2025:  Patient consents to use of AI scribe. The patient is a 48 year old female with the above listed medical problems referred for recommendations regarding preoperative evaluation prior to gastric bypass surgery which is scheduled for January.  The patient most recently presented to [facility] in September of this year with speech difficulties.  Her imaging studies as detailed above demonstrated new left basal ganglia infarct.  She had been taking Brilinta  only once a day.  An echocardiogram was done during that admission  however the bubble study portion was nondiagnostic.  Of note this was her second stroke.  She underwent left MCA stenting in 2019 as detailed above.       Current Medications: No outpatient medications have been marked as taking for the 03/29/24 encounter (Appointment) with Sehaj Mcenroe K, MD.     Review of Systems:   Please see the history of present illness.    All other systems reviewed and are negative.     EKGs/Labs/Other Test Reviewed:   EKG: 2025 sinus rhythm with nonspecific ST and T wave changes  EKG Interpretation Date/Time:    Ventricular Rate:    PR Interval:    QRS Duration:    QT Interval:    QTC Calculation:   R Axis:      Text Interpretation:          CARDIAC STUDIES: Refer to CV Procedures and Imaging Tabs   Risk Assessment/Calculations:   {Does this patient have ATRIAL FIBRILLATION?:[phone]}      Physical Exam:   VS:  LMP 07/28/2011    No BP recorded.  {Refresh Note OR Click here to enter BP  :1}***   Wt Readings from Last 3 Encounters:  03/01/24 (!) 334 lb (151.5 kg)  02/26/24 (!) 338 lb 14.4 oz (153.7 kg)  01/13/24 (!) 327 lb 3.2 oz (148.4 kg)      GENERAL:  No apparent distress, AOx3 HEENT:  No carotid bruits, +2 carotid impulses, no scleral icterus CAR: RRR Irregular RR*** no murmurs***, gallops, rubs, or thrills RES:  Clear to auscultation bilaterally ABD:  Soft, nontender, nondistended, positive bowel sounds x 4 VASC:  +2 radial pulses, +2 carotid pulses NEURO:  CN 2-12 grossly intact; motor and sensory grossly intact PSYCH:  No active depression or anxiety EXT:  No edema, ecchymosis, or cyanosis  Signed, Aleyssa Pike K Agness Sibrian, MD  03/22/2024 12:45 PM    [facility] [address] [place], [place], [place]  [address] Phone: [phone]; Fax: [phone]   Note:  This document was prepared using Dragon voice recognition software and may include unintentional dictation errors.

## 2024-03-23 ENCOUNTER — Encounter: Admitting: Dietician

## 2024-03-29 ENCOUNTER — Ambulatory Visit: Admitting: Internal Medicine

## 2024-03-29 DIAGNOSIS — Z6841 Body Mass Index (BMI) 40.0 and over, adult: Secondary | ICD-10-CM

## 2024-03-29 DIAGNOSIS — Z01810 Encounter for preprocedural cardiovascular examination: Secondary | ICD-10-CM

## 2024-03-29 DIAGNOSIS — I639 Cerebral infarction, unspecified: Secondary | ICD-10-CM

## 2024-03-29 DIAGNOSIS — E1169 Type 2 diabetes mellitus with other specified complication: Secondary | ICD-10-CM

## 2024-03-29 DIAGNOSIS — E1159 Type 2 diabetes mellitus with other circulatory complications: Secondary | ICD-10-CM

## 2024-03-29 DIAGNOSIS — E1122 Type 2 diabetes mellitus with diabetic chronic kidney disease: Secondary | ICD-10-CM

## 2024-03-29 DIAGNOSIS — E118 Type 2 diabetes mellitus with unspecified complications: Secondary | ICD-10-CM

## 2024-03-31 ENCOUNTER — Other Ambulatory Visit: Payer: Self-pay | Admitting: Internal Medicine

## 2024-04-02 NOTE — Telephone Encounter (Signed)
 Copied from CRM #[id number]. Topic: Clinical - Prescription Issue >> Apr 02, 2024  3:42 PM Rea ORN wrote: Reason for CRM: Pt calling to follow up on glipizide  request that was sent on 12/10 by pharmacy. Pt stated she has been out for a week. Please advise when this will be sent.  Please call back, [phone]

## 2024-04-07 ENCOUNTER — Telehealth: Payer: Self-pay

## 2024-04-07 NOTE — Telephone Encounter (Unsigned)
 Copied from CRM #[id number]. Topic: Clinical - Medication Question >> Apr 05, 2024  1:21 PM Sophia H wrote: Reason for CRM: Checking in on refill request for Glimepiride  2MG .   [id number] - [place], [place] - [address] AT [facility] [address]   E-Prescribing Status:Receipt confirmed by pharmacy(04/05/2024 7:38 AM EST) >> Apr 07, 2024  4:27 PM Alfonso ORN wrote: Pt called to f/u on rx. Advised per note showing e-receipt to pharm as of 12/15 . Pt still has not received please advise  >> Apr 07, 2024  4:25 PM Alfonso ORN wrote: Pt called

## 2024-04-08 ENCOUNTER — Other Ambulatory Visit: Payer: Self-pay

## 2024-04-08 MED ORDER — GLIMEPIRIDE 2 MG PO TABS: 2.0000 mg | ORAL_TABLET | Freq: Every day | ORAL | 2 refills | Status: AC

## 2024-04-08 NOTE — Telephone Encounter (Signed)
 Spoke with pharmacy, rx was not received. Re-sent rx.  Pt is aware.

## 2024-04-12 NOTE — Progress Notes (Signed)
 Anesthesia Review:  PCP: Cardiologist :  PPM/ ICD: Device Orders: Rep Notified:  Chest x-ray : 2v- 02/15/24  EKG : 01/02/24  Echo : 01/03/24  Stress test: Cardiac Cath :   Activity level:  Sleep Study/ CPAP : Fasting Blood Sugar :      / Checks Blood Sugar -- times a day:     DM- type 2  Hgba1c-  Glimeperide Mounjaro -    Blood Thinner/ Instructions /Last Dose: ASA / Instructions/ Last Dose :  Brilinta - lat dose on  Colonoscopy - 2023  Labs- 03/01/24    03/20/24- video visit for URI with Zelda Fleming,NP    Called pt on 04/12/24 - pt was not sure what she was having done on this day.  PT thought it was just a routine visit.  Instructed pt to call DR Stechschulte office to discuss with them.  PT voiced understanding.

## 2024-04-16 ENCOUNTER — Other Ambulatory Visit (HOSPITAL_COMMUNITY): Payer: Self-pay

## 2024-04-19 ENCOUNTER — Ambulatory Visit: Admitting: Family Medicine

## 2024-04-19 ENCOUNTER — Telehealth: Payer: Self-pay

## 2024-04-19 ENCOUNTER — Encounter: Payer: Self-pay | Admitting: Family Medicine

## 2024-04-19 ENCOUNTER — Other Ambulatory Visit (HOSPITAL_COMMUNITY): Payer: Self-pay

## 2024-04-19 VITALS — BP 150/82 | HR 67 | Temp 98.0°F | Ht 68.0 in | Wt 327.0 lb

## 2024-04-19 DIAGNOSIS — J069 Acute upper respiratory infection, unspecified: Secondary | ICD-10-CM

## 2024-04-19 MED ORDER — AZITHROMYCIN 250 MG PO TABS: ORAL_TABLET | ORAL | 0 refills | Status: AC

## 2024-04-19 MED ORDER — MONTELUKAST SODIUM 10 MG PO TABS: 10.0000 mg | ORAL_TABLET | Freq: Every day | ORAL | 0 refills | Status: AC

## 2024-04-19 MED ORDER — PREDNISONE 10 MG PO TABS: ORAL_TABLET | ORAL | 0 refills | Status: AC

## 2024-04-19 MED ORDER — BUDESONIDE-FORMOTEROL FUMARATE 80-4.5 MCG/ACT IN AERO: 2.0000 | INHALATION_SPRAY | Freq: Two times a day (BID) | RESPIRATORY_TRACT | 1 refills | Status: AC

## 2024-04-19 NOTE — Patient Instructions (Signed)
 Follow up as needed or as scheduled START the Prednisone  as directed- 3 pills at the same time x3 days, then 2 pills at the same time x3 days, then 1 pill daily.  Take w/ food  TAKE the Azithromycin  as directed- 2 today, and then 1 daily ADD the Symbicort  inhaler- 2 puffs twice daily- until you are feeling better TAKE the Singulair  (Montelukast ) nightly Continue Mucinex  DM Call with any questions or concerns Hang in there! Happy Early [place]!

## 2024-04-19 NOTE — Progress Notes (Unsigned)
" ° °  Subjective:    Patient ID: Debra [name], female    DOB: 10-20-1975, 48 y.o.   MRN: [id number]  HPI Cough- pt reports feeling 'ok' but has cough that will not go away.  Was seen on 11/29 and tx'd w/ cough syrup, Singulair , and Prednisone .  Sxs improved but didn't resolve.  Pt reports nasal drainage is causing cough.  Currently on Allegra daily.  Resumed Singulair .  Using Albuterol  inhaler and nebulizer.  Cough is worse at night.  Cough is wet but not productive.   Review of Systems For ROS see HPI     Objective:   Physical Exam Vitals reviewed.  Constitutional:      General: She is not in acute distress.    Appearance: Normal appearance. She is obese.  HENT:     Head: Normocephalic and atraumatic.     Right Ear: Tympanic membrane and ear canal normal.     Left Ear: Tympanic membrane and ear canal normal.     Nose: Congestion present. No rhinorrhea.     Comments: No TTP over frontal or maxillary sinuses    Mouth/Throat:     Mouth: Mucous membranes are moist.     Pharynx: No oropharyngeal exudate.     Comments: Copious PND Eyes:     Extraocular Movements: Extraocular movements intact.     Conjunctiva/sclera: Conjunctivae normal.  Cardiovascular:     Rate and Rhythm: Normal rate and regular rhythm.  Pulmonary:     Effort: Pulmonary effort is normal. No respiratory distress.     Breath sounds: Rhonchi (scattered rhonchi over L lung) present.     Comments: Nearly continuous hacking cough Musculoskeletal:     Cervical back: Neck supple.  Lymphadenopathy:     Cervical: No cervical adenopathy.  Skin:    General: Skin is warm and dry.  Neurological:     General: No focal deficit present.     Mental Status: She is alert and oriented to person, place, and time.  Psychiatric:        Mood and Affect: Mood normal.        Behavior: Behavior normal.        Thought Content: Thought content normal.           Assessment & Plan:  URI w/ cough and congestion- new to provider,  ongoing for pt.  Sxs started on 11/29 and have persisted for >4 weeks.  Does have scattered rhonchi and given duration w/ lack of improvement after 1st round of prednisone , concern for atypical infxn.  Start Zpack.  Prednisone  taper.  Add Symbicort  until cough improves.  Refill Singulair .  Continue daily antihistamine and Albuterol  PRN.  Reviewed supportive care and red flags that should prompt return.  Pt expressed understanding and is in agreement w/ plan.  "

## 2024-04-19 NOTE — Telephone Encounter (Signed)
 Pharmacy Patient Advocate Encounter   Received notification from Onbase that prior authorization for Budesonide -Formoterol  Fumarate 80-4.5MCG/ACT aerosol is required/requested.   Insurance verification completed.   The patient is insured through [employer].   Per test claim:  Brand name Symbicort  is preferred by the insurance.  If suggested medication is appropriate, Please send in a new RX and discontinue this one. If not, please advise as to why it's not appropriate so that we may request a Prior Authorization. Please note, some preferred medications may still require a PA.  If the suggested medications have not been trialed and there are no contraindications to their use, the PA will not be submitted, as it will not be approved.

## 2024-04-20 NOTE — Progress Notes (Signed)
 Pre call attempted for endo procedure 04/26/24 per patient saw her pcp yesterday and diagnosed with walking pneumonia. I informed patient she will need to reschedule for at least 8 weeks out per hospital anesthesia policy. Given number for Dr Lyndel office and will send them a secure message as well. Patient understood procedure cancelled for 1/5 and will call office to reschedule.

## 2024-04-23 NOTE — Progress Notes (Signed)
 "  Cardiology Office Note:   Date:  04/30/2024  ID:  Debra [name], DOB 08-22-1975, MRN [id number] PCP:  Rollene Almarie LABOR, MD  [facility] Providers Cardiologist:  Wendel Haws, MD Referring MD: Rollene Almarie LABOR, MD  Chief Complaint/Reason for Referral: Preoperative cardiovascular examination prior to bariatric surgery ASSESSMENT:    1. Preoperative cardiovascular examination   2. Cerebrovascular accident (CVA), unspecified mechanism (HCC)   3. Type 2 diabetes mellitus with complication, without long-term current use of insulin  (HCC)   4. Hypertension associated with diabetes (HCC)   5. Hyperlipidemia associated with type 2 diabetes mellitus (HCC)   6. CKD stage 2 due to type 2 diabetes mellitus (HCC)   7. BMI 50.0-59.9, adult (HCC)       PLAN:   In order of problems listed above: Preoperative cardiovascular assessment: Patient is active and can do 4 METS of activity without chest pain or shortness of breath.  Her echocardiogram recently was reassuring.  She is at low risk for cardiovascular complications around gastric bypass procedure.  She can stop her ticagrelor  5 days prior to her procedure but I would recommend continuing aspirin  throughout the perioperative period. History of CVA: I reviewed the patient's TEE with bubble study.  It is unclear whether she harbors a PFO or not.  I do not see an intra-atrial septal aneurysm on her TTE.  I will refer the patient for a 30-day monitor to exclude atrial fibrillation when I see her next time.  Following her surgery we will consider TEE to evaluate further.  For now continue Brilinta  90 mg twice daily, aspirin  81 mg T2DM: Continue aspirin  81 mg, rosuvastatin  with dose adjustment as below, will consider Jardiance  after gastric bypass.  Continue losartan   Hypertension: Increase losartan  to 50mg ; check BMP in 1 week Hyperlipidemia: Patient intolerant of rosuvastatin  and atorvastatin  due to myalgias.  Will refer to Pharm.D. for  further recommendations.  Given history of stroke goal LDL is less than 55.   CKD stage II: Continue losartan  25 mg; consider Jardiance  in the future Elevated BMI: Continue Mounjaro  2.5 mg q. weekly           Dispo:  Return in about 6 months (around 10/28/2024).       I spent 41 minutes reviewing all clinical data during and prior to this visit including all relevant imaging studies, laboratories, clinical information from other health systems and prior notes from both Cardiology and other specialties, interviewing the patient, conducting a complete physical examination, and coordinating care in order to formulate a comprehensive and personalized evaluation and treatment plan.   History of Present Illness:    FOCUSED PROBLEM LIST:   History of recurrent stroke CVA 2019 14 mm left parietal occipital hypoattenuation head CT No LVO, high-grade right M2 and left M2 lesions CTA Multiple acute infarctions posterior left temporal lobe and temporoparietal junction MRI Left MCA stent by IR Speech delay September 2025 Age indeterminant left frontal lobe white matter, left basal ganglia hypodensity, chronic watershed infarcts head CT No LVO, patent left MCA stent, severe chronic stenosis distal right MCA M1 CTA 2.0 x 1.1 cm acute infarct left basal ganglia MRI Nondiagnostic bubble study, no significant valve issues EF 70 to 75% TTE Presentation thought due to incorrect dosing of ticagrelor  T2DM Not on insulin  Hypertension Hyperlipidemia Intolerant of rosuvastatin  and atorvastatin  CKD stage II BMI 51 OSA On CPAP  Jnauary 2026:  Patient consents to use of AI scribe. The patient is a 49 year old female with the  above listed medical problems referred for recommendations regarding preoperative evaluation prior to gastric bypass surgery which is scheduled for January.  The patient most recently presented to [facility] in September of this year with speech difficulties.  Her imaging  studies as detailed above demonstrated new left basal ganglia infarct.  She had been taking Brilinta  only once a day.  An echocardiogram was done during that admission however the bubble study portion was nondiagnostic.  Of note this was her second stroke.  She underwent left MCA stenting in 2019 as detailed above.    She is currently on losartan  for blood pressure management, previously at a dose of 25 mg. She was on rosuvastatin  for cholesterol management but discontinued it due to severe cramps and has not tried other cholesterol medications since. She is contemplating gastric bypass surgery, though a specific date is not set.  She is able to walk up and down stairs and move around without experiencing shortness of breath or chest pain. 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 She notes swelling in her knee due to arthritis but no significant swelling elsewhere. No lightheadedness, blacking out spells, or chest pain. She uses a CPAP machine for sleep apnea.  She does not smoke and does not check her blood pressure at home. She also takes care of her mother, which impacts her ability to manage her own health monitoring.     Current Medications: Current Meds  Medication Sig   albuterol  (PROAIR  HFA) 108 (90 Base) MCG/ACT inhaler Inhale 2 puffs into the lungs every 4 (four) hours as needed for wheezing or shortness of breath.   albuterol  (PROVENTIL ) (2.5 MG/3ML) 0.083% nebulizer solution Take 3 mLs (2.5 mg total) by nebulization every 6 (six) hours as needed for wheezing or shortness of breath.   aspirin  EC 81 MG tablet Take 81 mg by mouth daily. Swallow whole.   Blood Pressure Monitor MISC Use as directed   budesonide -formoterol  (SYMBICORT ) 80-4.5 MCG/ACT inhaler Inhale 2 puffs into the lungs 2 (two) times daily.   EPINEPHrine  0.3 mg/0.3 mL IJ SOAJ injection Inject 0.3 mLs (0.3 mg total) into the muscle as needed for anaphylaxis.   fexofenadine (ALLEGRA) 180 MG tablet Take 180 mg  by mouth daily.    fluticasone  (FLONASE ) 50 MCG/ACT nasal spray Place 2 sprays into both nostrils daily as needed for allergies.   glimepiride  (AMARYL ) 2 MG tablet Take 1 tablet (2 mg total) by mouth daily with breakfast.   losartan  (COZAAR ) 50 MG tablet Take 1 tablet (50 mg total) by mouth daily.   montelukast  (SINGULAIR ) 10 MG tablet Take 1 tablet (10 mg total) by mouth at bedtime.   promethazine -dextromethorphan (PROMETHAZINE -DM) 6.25-15 MG/5ML syrup Take 5 mLs by mouth 4 (four) times daily as needed.   senna (SENOKOT) 8.6 MG TABS tablet Take 1 tablet by mouth daily.   ticagrelor  (BRILINTA ) 90 MG TABS tablet Take 0.5 tablet (45 mg total) by mouth 2 (two) times daily.   tirzepatide  (MOUNJARO ) 2.5 MG/0.5ML Pen Inject 2.5 mg into the skin once a week.   [DISCONTINUED] losartan  (COZAAR ) 25 MG tablet Take 1 tablet (25 mg total) by mouth daily.     Review of Systems:   Please see the history of present illness.    All other systems reviewed and are negative.     EKGs/Labs/Other Test Reviewed:   EKG:   EKG Interpretation Date/Time:  Friday April 30 2024 15:45:37 EST Ventricular Rate:  64 PR Interval:  140 QRS Duration:  80 QT Interval:  406 QTC Calculation: 418 R Axis:   10  Text Interpretation: Normal sinus rhythm Minimal voltage criteria for LVH, may be normal variant ( R in aVL ) Cannot rule out Anterior infarct , age undetermined When compared with ECG of 02-Jan-2024 12:28, PREVIOUS ECG IS PRESENT Confirmed by Wendel Haws ([id number]) on 04/30/2024 3:49:54 PM        CARDIAC STUDIES: Refer to CV Procedures and Imaging Tabs   Risk Assessment/Calculations:          Physical Exam:   VS:  BP (!) 150/80   Pulse 63   Ht 5' 8 (1.727 m)   Wt (!) 328 lb (148.8 kg)   LMP 07/28/2011   SpO2 98%   BMI 49.87 kg/m    HYPERTENSION CONTROL Vitals:   04/30/24 1548 04/30/24 1603  BP: (!) 172/120 (!) 150/80    The patient's blood pressure is elevated above target today.  In order  to address the patient's elevated BP: A current anti-hypertensive medication was adjusted today.      Wt Readings from Last 3 Encounters:  04/30/24 (!) 328 lb (148.8 kg)  04/19/24 (!) 327 lb (148.3 kg)  03/01/24 (!) 334 lb (151.5 kg)      GENERAL:  No apparent distress, AOx3 HEENT:  No carotid bruits, +2 carotid impulses, no scleral icterus CAR: RRR no murmurs, gallops, rubs, or thrills RES:  Clear to auscultation bilaterally ABD:  Soft, nontender, nondistended, positive bowel sounds x 4 VASC:  +2 radial pulses, +2 carotid pulses NEURO:  CN 2-12 grossly intact; motor and sensory grossly intact PSYCH:  No active depression or anxiety EXT:  No edema, ecchymosis, or cyanosis  Signed, Bates Collington K Segundo Makela, MD  04/30/2024 4:07 PM    [facility] [address] [place], [place], [place]  [address] Phone: [phone]; Fax: [phone]   Note:  This document was prepared using Dragon voice recognition software and may include unintentional dictation errors. "

## 2024-04-26 ENCOUNTER — Ambulatory Visit (HOSPITAL_COMMUNITY): Admission: RE | Admit: 2024-04-26 | Source: Home / Self Care | Admitting: Surgery

## 2024-04-30 ENCOUNTER — Ambulatory Visit: Attending: Internal Medicine | Admitting: Internal Medicine

## 2024-04-30 ENCOUNTER — Encounter: Payer: Self-pay | Admitting: Internal Medicine

## 2024-04-30 VITALS — BP 150/80 | HR 63 | Ht 68.0 in | Wt 328.0 lb

## 2024-04-30 DIAGNOSIS — Z01810 Encounter for preprocedural cardiovascular examination: Secondary | ICD-10-CM | POA: Diagnosis present

## 2024-04-30 DIAGNOSIS — E1122 Type 2 diabetes mellitus with diabetic chronic kidney disease: Secondary | ICD-10-CM | POA: Diagnosis present

## 2024-04-30 DIAGNOSIS — E785 Hyperlipidemia, unspecified: Secondary | ICD-10-CM | POA: Diagnosis present

## 2024-04-30 DIAGNOSIS — N182 Chronic kidney disease, stage 2 (mild): Secondary | ICD-10-CM | POA: Diagnosis present

## 2024-04-30 DIAGNOSIS — E118 Type 2 diabetes mellitus with unspecified complications: Secondary | ICD-10-CM | POA: Diagnosis present

## 2024-04-30 DIAGNOSIS — I639 Cerebral infarction, unspecified: Secondary | ICD-10-CM | POA: Diagnosis present

## 2024-04-30 DIAGNOSIS — E1159 Type 2 diabetes mellitus with other circulatory complications: Secondary | ICD-10-CM | POA: Insufficient documentation

## 2024-04-30 DIAGNOSIS — Z6841 Body Mass Index (BMI) 40.0 and over, adult: Secondary | ICD-10-CM | POA: Insufficient documentation

## 2024-04-30 DIAGNOSIS — I152 Hypertension secondary to endocrine disorders: Secondary | ICD-10-CM | POA: Diagnosis present

## 2024-04-30 DIAGNOSIS — E1169 Type 2 diabetes mellitus with other specified complication: Secondary | ICD-10-CM | POA: Insufficient documentation

## 2024-04-30 MED ORDER — LOSARTAN POTASSIUM 50 MG PO TABS: 50.0000 mg | ORAL_TABLET | Freq: Every day | ORAL | 3 refills | Status: AC

## 2024-04-30 NOTE — Patient Instructions (Signed)
 Medication Instructions:  INCREASE Losartan  to 50 mg once daily   *If you need a refill on your cardiac medications before your next appointment, please call your pharmacy*  Lab Work: To be completed in 1 week: BMP  If you have labs (blood work) drawn today and your tests are completely normal, you will receive your results only by: MyChart Message (if you have MyChart) OR A paper copy in the mail If you have any lab test that is abnormal or we need to change your treatment, we will call you to review the results.  Testing/Procedures: None ordered today.  Follow-Up: At [facility], you and your health needs are our priority.  As part of our continuing mission to provide you with exceptional heart care, our providers are all part of one team.  This team includes your primary Cardiologist (physician) and Advanced Practice Providers or APPs (Physician Assistants and Nurse Practitioners) who all work together to provide you with the care you need, when you need it.  Your next appointment:   6 month(s)  Provider:   Lurena Red, MD    We recommend signing up for the patient portal called MyChart.  Sign up information is provided on this After Visit Summary.  MyChart is used to connect with patients for Virtual Visits (Telemedicine).  Patients are able to view lab/test results, encounter notes, upcoming appointments, etc.  Non-urgent messages can be sent to your provider as well.   To learn more about what you can do with MyChart, go to [email or web address].   Other Instructions You have been referred to our PharmD clinic to discuss lipid management. Our office will be reaching out to you to schedule this appointment.

## 2024-05-25 ENCOUNTER — Ambulatory Visit (HOSPITAL_COMMUNITY): Payer: Self-pay | Admitting: Licensed Clinical Social Worker

## 2024-05-25 DIAGNOSIS — F4320 Adjustment disorder, unspecified: Secondary | ICD-10-CM

## 2024-05-27 ENCOUNTER — Other Ambulatory Visit: Payer: Self-pay | Admitting: Family Medicine

## 2024-05-27 DIAGNOSIS — J069 Acute upper respiratory infection, unspecified: Secondary | ICD-10-CM

## 2024-06-23 ENCOUNTER — Ambulatory Visit: Admitting: Pharmacist
